# Patient Record
Sex: Male | Born: 1945 | Race: White | Hispanic: No | Marital: Married | Smoking: Never smoker
Health system: Southern US, Community
[De-identification: ages and names within clinical notes are randomized; demographics above are authoritative.]

## PROBLEM LIST (undated history)

## (undated) ENCOUNTER — Emergency Department (HOSPITAL_COMMUNITY): Payer: Medicare Other | Source: Home / Self Care

## (undated) DIAGNOSIS — M0609 Rheumatoid arthritis without rheumatoid factor, multiple sites: Secondary | ICD-10-CM

## (undated) DIAGNOSIS — I5022 Chronic systolic (congestive) heart failure: Secondary | ICD-10-CM

## (undated) DIAGNOSIS — A419 Sepsis, unspecified organism: Secondary | ICD-10-CM

## (undated) DIAGNOSIS — I255 Ischemic cardiomyopathy: Secondary | ICD-10-CM

## (undated) DIAGNOSIS — M545 Low back pain, unspecified: Secondary | ICD-10-CM

## (undated) DIAGNOSIS — N183 Chronic kidney disease, stage 3 unspecified: Secondary | ICD-10-CM

## (undated) DIAGNOSIS — F411 Generalized anxiety disorder: Secondary | ICD-10-CM

## (undated) DIAGNOSIS — I2510 Atherosclerotic heart disease of native coronary artery without angina pectoris: Secondary | ICD-10-CM

## (undated) DIAGNOSIS — Z9989 Dependence on other enabling machines and devices: Secondary | ICD-10-CM

## (undated) DIAGNOSIS — I4819 Other persistent atrial fibrillation: Secondary | ICD-10-CM

## (undated) DIAGNOSIS — M797 Fibromyalgia: Secondary | ICD-10-CM

## (undated) DIAGNOSIS — I10 Essential (primary) hypertension: Secondary | ICD-10-CM

## (undated) DIAGNOSIS — F329 Major depressive disorder, single episode, unspecified: Secondary | ICD-10-CM

## (undated) DIAGNOSIS — G8929 Other chronic pain: Secondary | ICD-10-CM

## (undated) DIAGNOSIS — M1990 Unspecified osteoarthritis, unspecified site: Secondary | ICD-10-CM

## (undated) DIAGNOSIS — E1169 Type 2 diabetes mellitus with other specified complication: Secondary | ICD-10-CM

## (undated) DIAGNOSIS — G4733 Obstructive sleep apnea (adult) (pediatric): Secondary | ICD-10-CM

## (undated) DIAGNOSIS — I219 Acute myocardial infarction, unspecified: Secondary | ICD-10-CM

## (undated) DIAGNOSIS — F32A Depression, unspecified: Secondary | ICD-10-CM

## (undated) DIAGNOSIS — E876 Hypokalemia: Secondary | ICD-10-CM

## (undated) DIAGNOSIS — E669 Obesity, unspecified: Secondary | ICD-10-CM

## (undated) DIAGNOSIS — Z9289 Personal history of other medical treatment: Secondary | ICD-10-CM

## (undated) DIAGNOSIS — E7800 Pure hypercholesterolemia, unspecified: Secondary | ICD-10-CM

## (undated) DIAGNOSIS — D649 Anemia, unspecified: Secondary | ICD-10-CM

## (undated) DIAGNOSIS — R6521 Severe sepsis with septic shock: Secondary | ICD-10-CM

## (undated) DIAGNOSIS — B159 Hepatitis A without hepatic coma: Secondary | ICD-10-CM

## (undated) HISTORY — DX: Chronic systolic (congestive) heart failure: I50.22

## (undated) HISTORY — DX: Type 2 diabetes mellitus with other specified complication: E66.9

## (undated) HISTORY — PX: KNEE ARTHROSCOPY: SHX127

## (undated) HISTORY — DX: Chronic kidney disease, stage 3 (moderate): N18.3

## (undated) HISTORY — PX: CORONARY ANGIOPLASTY WITH STENT PLACEMENT: SHX49

## (undated) HISTORY — DX: Anemia, unspecified: D64.9

## (undated) HISTORY — DX: Chronic kidney disease, stage 3 unspecified: N18.30

## (undated) HISTORY — DX: Hypokalemia: E87.6

## (undated) HISTORY — DX: Type 2 diabetes mellitus with other specified complication: E11.69

## (undated) HISTORY — DX: Generalized anxiety disorder: F41.1

## (undated) HISTORY — PX: BACK SURGERY: SHX140

## (undated) HISTORY — PX: OTHER SURGICAL HISTORY: SHX169

## (undated) HISTORY — DX: Sepsis, unspecified organism: A41.9

## (undated) HISTORY — DX: Severe sepsis with septic shock: R65.21

## (undated) HISTORY — PX: NASAL SINUS SURGERY: SHX719

## (undated) HISTORY — PX: CORONARY ANGIOPLASTY: SHX604

## (undated) HISTORY — DX: Atherosclerotic heart disease of native coronary artery without angina pectoris: I25.10

## (undated) HISTORY — DX: Other persistent atrial fibrillation: I48.19

## (undated) HISTORY — PX: CATARACT EXTRACTION W/ INTRAOCULAR LENS  IMPLANT, BILATERAL: SHX1307

## (undated) HISTORY — DX: Ischemic cardiomyopathy: I25.5

## (undated) HISTORY — DX: Rheumatoid arthritis without rheumatoid factor, multiple sites: M06.09

## (undated) SURGERY — LEFT HEART CATH AND CORONARY ANGIOGRAPHY
Anesthesia: Moderate Sedation

---
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
## 2013-01-12 DIAGNOSIS — R55 Syncope and collapse: Secondary | ICD-10-CM | POA: Insufficient documentation

## 2013-01-12 DIAGNOSIS — N179 Acute kidney failure, unspecified: Secondary | ICD-10-CM | POA: Insufficient documentation

## 2013-11-11 DIAGNOSIS — M48062 Spinal stenosis, lumbar region with neurogenic claudication: Secondary | ICD-10-CM | POA: Insufficient documentation

## 2013-11-17 DIAGNOSIS — M5126 Other intervertebral disc displacement, lumbar region: Secondary | ICD-10-CM | POA: Insufficient documentation

## 2013-12-03 DIAGNOSIS — N289 Disorder of kidney and ureter, unspecified: Secondary | ICD-10-CM

## 2013-12-29 DIAGNOSIS — Z9889 Other specified postprocedural states: Secondary | ICD-10-CM | POA: Insufficient documentation

## 2013-12-31 HISTORY — PX: POSTERIOR LUMBAR FUSION: SHX6036

## 2014-01-06 DIAGNOSIS — I10 Essential (primary) hypertension: Secondary | ICD-10-CM | POA: Diagnosis not present

## 2014-01-21 DIAGNOSIS — M999 Biomechanical lesion, unspecified: Secondary | ICD-10-CM | POA: Diagnosis not present

## 2014-01-21 DIAGNOSIS — S23101A Dislocation of unspecified thoracic vertebra, initial encounter: Secondary | ICD-10-CM | POA: Diagnosis not present

## 2014-01-21 DIAGNOSIS — S332XXA Dislocation of sacroiliac and sacrococcygeal joint, initial encounter: Secondary | ICD-10-CM | POA: Diagnosis not present

## 2014-02-05 DIAGNOSIS — E669 Obesity, unspecified: Secondary | ICD-10-CM | POA: Diagnosis not present

## 2014-02-05 DIAGNOSIS — I428 Other cardiomyopathies: Secondary | ICD-10-CM | POA: Diagnosis not present

## 2014-02-05 DIAGNOSIS — I2510 Atherosclerotic heart disease of native coronary artery without angina pectoris: Secondary | ICD-10-CM | POA: Diagnosis not present

## 2014-02-05 DIAGNOSIS — I119 Hypertensive heart disease without heart failure: Secondary | ICD-10-CM | POA: Diagnosis not present

## 2014-02-08 DIAGNOSIS — S23101A Dislocation of unspecified thoracic vertebra, initial encounter: Secondary | ICD-10-CM | POA: Diagnosis not present

## 2014-02-08 DIAGNOSIS — M999 Biomechanical lesion, unspecified: Secondary | ICD-10-CM | POA: Diagnosis not present

## 2014-02-08 DIAGNOSIS — S332XXA Dislocation of sacroiliac and sacrococcygeal joint, initial encounter: Secondary | ICD-10-CM | POA: Diagnosis not present

## 2014-02-11 DIAGNOSIS — S332XXA Dislocation of sacroiliac and sacrococcygeal joint, initial encounter: Secondary | ICD-10-CM | POA: Diagnosis not present

## 2014-02-11 DIAGNOSIS — S23101A Dislocation of unspecified thoracic vertebra, initial encounter: Secondary | ICD-10-CM | POA: Diagnosis not present

## 2014-02-11 DIAGNOSIS — M999 Biomechanical lesion, unspecified: Secondary | ICD-10-CM | POA: Diagnosis not present

## 2014-02-15 DIAGNOSIS — H40059 Ocular hypertension, unspecified eye: Secondary | ICD-10-CM | POA: Diagnosis not present

## 2014-02-15 DIAGNOSIS — Z79899 Other long term (current) drug therapy: Secondary | ICD-10-CM | POA: Diagnosis not present

## 2014-02-15 DIAGNOSIS — E119 Type 2 diabetes mellitus without complications: Secondary | ICD-10-CM | POA: Diagnosis not present

## 2014-02-15 DIAGNOSIS — S332XXA Dislocation of sacroiliac and sacrococcygeal joint, initial encounter: Secondary | ICD-10-CM | POA: Diagnosis not present

## 2014-02-15 DIAGNOSIS — S23101A Dislocation of unspecified thoracic vertebra, initial encounter: Secondary | ICD-10-CM | POA: Diagnosis not present

## 2014-02-15 DIAGNOSIS — M069 Rheumatoid arthritis, unspecified: Secondary | ICD-10-CM | POA: Diagnosis not present

## 2014-02-15 DIAGNOSIS — M999 Biomechanical lesion, unspecified: Secondary | ICD-10-CM | POA: Diagnosis not present

## 2014-02-18 DIAGNOSIS — S23101A Dislocation of unspecified thoracic vertebra, initial encounter: Secondary | ICD-10-CM | POA: Diagnosis not present

## 2014-02-18 DIAGNOSIS — M999 Biomechanical lesion, unspecified: Secondary | ICD-10-CM | POA: Diagnosis not present

## 2014-02-18 DIAGNOSIS — S332XXA Dislocation of sacroiliac and sacrococcygeal joint, initial encounter: Secondary | ICD-10-CM | POA: Diagnosis not present

## 2014-02-19 DIAGNOSIS — S23101A Dislocation of unspecified thoracic vertebra, initial encounter: Secondary | ICD-10-CM | POA: Diagnosis not present

## 2014-02-19 DIAGNOSIS — S332XXA Dislocation of sacroiliac and sacrococcygeal joint, initial encounter: Secondary | ICD-10-CM | POA: Diagnosis not present

## 2014-02-19 DIAGNOSIS — M999 Biomechanical lesion, unspecified: Secondary | ICD-10-CM | POA: Diagnosis not present

## 2014-02-22 DIAGNOSIS — S332XXA Dislocation of sacroiliac and sacrococcygeal joint, initial encounter: Secondary | ICD-10-CM | POA: Diagnosis not present

## 2014-02-22 DIAGNOSIS — S23101A Dislocation of unspecified thoracic vertebra, initial encounter: Secondary | ICD-10-CM | POA: Diagnosis not present

## 2014-02-22 DIAGNOSIS — M999 Biomechanical lesion, unspecified: Secondary | ICD-10-CM | POA: Diagnosis not present

## 2014-02-23 DIAGNOSIS — S23101A Dislocation of unspecified thoracic vertebra, initial encounter: Secondary | ICD-10-CM | POA: Diagnosis not present

## 2014-02-23 DIAGNOSIS — S332XXA Dislocation of sacroiliac and sacrococcygeal joint, initial encounter: Secondary | ICD-10-CM | POA: Diagnosis not present

## 2014-02-23 DIAGNOSIS — M999 Biomechanical lesion, unspecified: Secondary | ICD-10-CM | POA: Diagnosis not present

## 2014-02-24 DIAGNOSIS — M5126 Other intervertebral disc displacement, lumbar region: Secondary | ICD-10-CM | POA: Diagnosis not present

## 2014-03-05 DIAGNOSIS — J84112 Idiopathic pulmonary fibrosis: Secondary | ICD-10-CM | POA: Diagnosis not present

## 2014-03-05 DIAGNOSIS — G4733 Obstructive sleep apnea (adult) (pediatric): Secondary | ICD-10-CM | POA: Diagnosis not present

## 2014-03-09 DIAGNOSIS — M545 Low back pain, unspecified: Secondary | ICD-10-CM | POA: Diagnosis not present

## 2014-03-09 DIAGNOSIS — M48061 Spinal stenosis, lumbar region without neurogenic claudication: Secondary | ICD-10-CM | POA: Diagnosis not present

## 2014-03-09 DIAGNOSIS — M5126 Other intervertebral disc displacement, lumbar region: Secondary | ICD-10-CM | POA: Diagnosis not present

## 2014-03-09 DIAGNOSIS — Z9889 Other specified postprocedural states: Secondary | ICD-10-CM | POA: Diagnosis not present

## 2014-03-09 DIAGNOSIS — M4310 Spondylolisthesis, site unspecified: Secondary | ICD-10-CM | POA: Diagnosis not present

## 2014-03-10 DIAGNOSIS — Z9889 Other specified postprocedural states: Secondary | ICD-10-CM | POA: Diagnosis not present

## 2014-03-10 DIAGNOSIS — M5126 Other intervertebral disc displacement, lumbar region: Secondary | ICD-10-CM | POA: Diagnosis not present

## 2014-03-10 DIAGNOSIS — Z981 Arthrodesis status: Secondary | ICD-10-CM | POA: Diagnosis not present

## 2014-03-10 DIAGNOSIS — M4310 Spondylolisthesis, site unspecified: Secondary | ICD-10-CM | POA: Diagnosis not present

## 2014-03-10 DIAGNOSIS — Z4789 Encounter for other orthopedic aftercare: Secondary | ICD-10-CM | POA: Diagnosis not present

## 2014-03-18 DIAGNOSIS — M069 Rheumatoid arthritis, unspecified: Secondary | ICD-10-CM | POA: Diagnosis not present

## 2014-03-18 DIAGNOSIS — G609 Hereditary and idiopathic neuropathy, unspecified: Secondary | ICD-10-CM | POA: Diagnosis not present

## 2014-03-18 DIAGNOSIS — Z79899 Other long term (current) drug therapy: Secondary | ICD-10-CM | POA: Diagnosis not present

## 2014-03-18 DIAGNOSIS — F411 Generalized anxiety disorder: Secondary | ICD-10-CM | POA: Diagnosis not present

## 2014-03-18 DIAGNOSIS — M159 Polyosteoarthritis, unspecified: Secondary | ICD-10-CM | POA: Diagnosis not present

## 2014-03-22 DIAGNOSIS — D649 Anemia, unspecified: Secondary | ICD-10-CM | POA: Diagnosis not present

## 2014-03-22 DIAGNOSIS — E119 Type 2 diabetes mellitus without complications: Secondary | ICD-10-CM | POA: Diagnosis not present

## 2014-04-01 DIAGNOSIS — S332XXA Dislocation of sacroiliac and sacrococcygeal joint, initial encounter: Secondary | ICD-10-CM | POA: Diagnosis not present

## 2014-04-01 DIAGNOSIS — S23101A Dislocation of unspecified thoracic vertebra, initial encounter: Secondary | ICD-10-CM | POA: Diagnosis not present

## 2014-04-01 DIAGNOSIS — M999 Biomechanical lesion, unspecified: Secondary | ICD-10-CM | POA: Diagnosis not present

## 2014-04-09 DIAGNOSIS — D649 Anemia, unspecified: Secondary | ICD-10-CM | POA: Diagnosis not present

## 2014-04-09 DIAGNOSIS — M4310 Spondylolisthesis, site unspecified: Secondary | ICD-10-CM | POA: Diagnosis not present

## 2014-04-09 DIAGNOSIS — Z8249 Family history of ischemic heart disease and other diseases of the circulatory system: Secondary | ICD-10-CM | POA: Diagnosis not present

## 2014-04-09 DIAGNOSIS — I2510 Atherosclerotic heart disease of native coronary artery without angina pectoris: Secondary | ICD-10-CM | POA: Diagnosis present

## 2014-04-09 DIAGNOSIS — I10 Essential (primary) hypertension: Secondary | ICD-10-CM | POA: Diagnosis present

## 2014-04-09 DIAGNOSIS — Z8719 Personal history of other diseases of the digestive system: Secondary | ICD-10-CM | POA: Diagnosis not present

## 2014-04-09 DIAGNOSIS — G252 Other specified forms of tremor: Secondary | ICD-10-CM | POA: Diagnosis present

## 2014-04-09 DIAGNOSIS — Z9861 Coronary angioplasty status: Secondary | ICD-10-CM | POA: Diagnosis not present

## 2014-04-09 DIAGNOSIS — G250 Essential tremor: Secondary | ICD-10-CM | POA: Diagnosis present

## 2014-04-09 DIAGNOSIS — F411 Generalized anxiety disorder: Secondary | ICD-10-CM | POA: Diagnosis present

## 2014-04-09 DIAGNOSIS — M48061 Spinal stenosis, lumbar region without neurogenic claudication: Secondary | ICD-10-CM | POA: Insufficient documentation

## 2014-04-09 DIAGNOSIS — E119 Type 2 diabetes mellitus without complications: Secondary | ICD-10-CM | POA: Diagnosis not present

## 2014-04-09 DIAGNOSIS — IMO0002 Reserved for concepts with insufficient information to code with codable children: Secondary | ICD-10-CM | POA: Diagnosis not present

## 2014-04-09 DIAGNOSIS — Z981 Arthrodesis status: Secondary | ICD-10-CM | POA: Diagnosis not present

## 2014-04-09 DIAGNOSIS — I2589 Other forms of chronic ischemic heart disease: Secondary | ICD-10-CM | POA: Diagnosis present

## 2014-04-09 DIAGNOSIS — Z7982 Long term (current) use of aspirin: Secondary | ICD-10-CM | POA: Diagnosis not present

## 2014-04-09 DIAGNOSIS — I252 Old myocardial infarction: Secondary | ICD-10-CM | POA: Diagnosis not present

## 2014-04-09 DIAGNOSIS — Z9849 Cataract extraction status, unspecified eye: Secondary | ICD-10-CM | POA: Diagnosis not present

## 2014-04-09 DIAGNOSIS — Z9889 Other specified postprocedural states: Secondary | ICD-10-CM | POA: Diagnosis not present

## 2014-04-09 DIAGNOSIS — Z79899 Other long term (current) drug therapy: Secondary | ICD-10-CM | POA: Diagnosis not present

## 2014-04-09 DIAGNOSIS — Z7902 Long term (current) use of antithrombotics/antiplatelets: Secondary | ICD-10-CM | POA: Diagnosis not present

## 2014-04-09 DIAGNOSIS — M069 Rheumatoid arthritis, unspecified: Secondary | ICD-10-CM | POA: Diagnosis present

## 2014-04-09 DIAGNOSIS — Z794 Long term (current) use of insulin: Secondary | ICD-10-CM | POA: Diagnosis not present

## 2014-04-09 DIAGNOSIS — M5126 Other intervertebral disc displacement, lumbar region: Secondary | ICD-10-CM | POA: Diagnosis not present

## 2014-04-09 DIAGNOSIS — G4733 Obstructive sleep apnea (adult) (pediatric): Secondary | ICD-10-CM | POA: Diagnosis not present

## 2014-04-16 DIAGNOSIS — Z4789 Encounter for other orthopedic aftercare: Secondary | ICD-10-CM | POA: Diagnosis not present

## 2014-04-16 DIAGNOSIS — M5410 Radiculopathy, site unspecified: Secondary | ICD-10-CM | POA: Insufficient documentation

## 2014-04-16 DIAGNOSIS — Z981 Arthrodesis status: Secondary | ICD-10-CM | POA: Insufficient documentation

## 2014-04-16 DIAGNOSIS — R29898 Other symptoms and signs involving the musculoskeletal system: Secondary | ICD-10-CM | POA: Diagnosis not present

## 2014-04-16 DIAGNOSIS — M48061 Spinal stenosis, lumbar region without neurogenic claudication: Secondary | ICD-10-CM | POA: Diagnosis not present

## 2014-04-16 DIAGNOSIS — M79609 Pain in unspecified limb: Secondary | ICD-10-CM | POA: Diagnosis not present

## 2014-04-16 DIAGNOSIS — G8918 Other acute postprocedural pain: Secondary | ICD-10-CM | POA: Diagnosis not present

## 2014-04-21 DIAGNOSIS — I2510 Atherosclerotic heart disease of native coronary artery without angina pectoris: Secondary | ICD-10-CM | POA: Diagnosis not present

## 2014-04-21 DIAGNOSIS — F3289 Other specified depressive episodes: Secondary | ICD-10-CM | POA: Diagnosis not present

## 2014-04-21 DIAGNOSIS — E785 Hyperlipidemia, unspecified: Secondary | ICD-10-CM | POA: Diagnosis not present

## 2014-04-21 DIAGNOSIS — M4800 Spinal stenosis, site unspecified: Secondary | ICD-10-CM | POA: Diagnosis not present

## 2014-04-21 DIAGNOSIS — E559 Vitamin D deficiency, unspecified: Secondary | ICD-10-CM | POA: Diagnosis not present

## 2014-04-21 DIAGNOSIS — M6281 Muscle weakness (generalized): Secondary | ICD-10-CM | POA: Diagnosis not present

## 2014-04-21 DIAGNOSIS — D649 Anemia, unspecified: Secondary | ICD-10-CM | POA: Diagnosis not present

## 2014-04-21 DIAGNOSIS — E876 Hypokalemia: Secondary | ICD-10-CM | POA: Diagnosis not present

## 2014-04-21 DIAGNOSIS — IMO0001 Reserved for inherently not codable concepts without codable children: Secondary | ICD-10-CM | POA: Diagnosis not present

## 2014-04-21 DIAGNOSIS — R339 Retention of urine, unspecified: Secondary | ICD-10-CM | POA: Diagnosis not present

## 2014-04-21 DIAGNOSIS — N138 Other obstructive and reflux uropathy: Secondary | ICD-10-CM | POA: Diagnosis not present

## 2014-04-21 DIAGNOSIS — F411 Generalized anxiety disorder: Secondary | ICD-10-CM | POA: Diagnosis not present

## 2014-04-21 DIAGNOSIS — E119 Type 2 diabetes mellitus without complications: Secondary | ICD-10-CM | POA: Diagnosis not present

## 2014-04-21 DIAGNOSIS — M069 Rheumatoid arthritis, unspecified: Secondary | ICD-10-CM | POA: Diagnosis not present

## 2014-04-21 DIAGNOSIS — R5381 Other malaise: Secondary | ICD-10-CM | POA: Diagnosis not present

## 2014-04-21 DIAGNOSIS — G250 Essential tremor: Secondary | ICD-10-CM | POA: Diagnosis not present

## 2014-04-21 DIAGNOSIS — F329 Major depressive disorder, single episode, unspecified: Secondary | ICD-10-CM | POA: Diagnosis not present

## 2014-04-21 DIAGNOSIS — N401 Enlarged prostate with lower urinary tract symptoms: Secondary | ICD-10-CM | POA: Diagnosis not present

## 2014-04-21 DIAGNOSIS — J449 Chronic obstructive pulmonary disease, unspecified: Secondary | ICD-10-CM | POA: Diagnosis not present

## 2014-04-21 DIAGNOSIS — D539 Nutritional anemia, unspecified: Secondary | ICD-10-CM | POA: Diagnosis not present

## 2014-04-21 DIAGNOSIS — R262 Difficulty in walking, not elsewhere classified: Secondary | ICD-10-CM | POA: Diagnosis not present

## 2014-04-21 DIAGNOSIS — I10 Essential (primary) hypertension: Secondary | ICD-10-CM | POA: Diagnosis not present

## 2014-04-21 DIAGNOSIS — M62838 Other muscle spasm: Secondary | ICD-10-CM | POA: Diagnosis not present

## 2014-04-28 DIAGNOSIS — D649 Anemia, unspecified: Secondary | ICD-10-CM | POA: Diagnosis not present

## 2014-04-29 DIAGNOSIS — D539 Nutritional anemia, unspecified: Secondary | ICD-10-CM | POA: Diagnosis not present

## 2014-04-29 DIAGNOSIS — R5381 Other malaise: Secondary | ICD-10-CM | POA: Diagnosis not present

## 2014-04-29 DIAGNOSIS — E119 Type 2 diabetes mellitus without complications: Secondary | ICD-10-CM | POA: Diagnosis not present

## 2014-04-29 DIAGNOSIS — M4800 Spinal stenosis, site unspecified: Secondary | ICD-10-CM | POA: Diagnosis not present

## 2014-04-29 DIAGNOSIS — IMO0001 Reserved for inherently not codable concepts without codable children: Secondary | ICD-10-CM | POA: Diagnosis not present

## 2014-04-29 DIAGNOSIS — F411 Generalized anxiety disorder: Secondary | ICD-10-CM | POA: Diagnosis not present

## 2014-05-10 DIAGNOSIS — R339 Retention of urine, unspecified: Secondary | ICD-10-CM | POA: Diagnosis not present

## 2014-05-10 DIAGNOSIS — N138 Other obstructive and reflux uropathy: Secondary | ICD-10-CM | POA: Diagnosis not present

## 2014-05-13 DIAGNOSIS — M4800 Spinal stenosis, site unspecified: Secondary | ICD-10-CM | POA: Diagnosis not present

## 2014-05-13 DIAGNOSIS — E119 Type 2 diabetes mellitus without complications: Secondary | ICD-10-CM | POA: Diagnosis not present

## 2014-05-13 DIAGNOSIS — F411 Generalized anxiety disorder: Secondary | ICD-10-CM | POA: Diagnosis not present

## 2014-05-13 DIAGNOSIS — D539 Nutritional anemia, unspecified: Secondary | ICD-10-CM | POA: Diagnosis not present

## 2014-05-13 DIAGNOSIS — IMO0001 Reserved for inherently not codable concepts without codable children: Secondary | ICD-10-CM | POA: Diagnosis not present

## 2014-05-13 DIAGNOSIS — R5381 Other malaise: Secondary | ICD-10-CM | POA: Diagnosis not present

## 2014-05-17 DIAGNOSIS — M999 Biomechanical lesion, unspecified: Secondary | ICD-10-CM | POA: Diagnosis not present

## 2014-05-17 DIAGNOSIS — S332XXA Dislocation of sacroiliac and sacrococcygeal joint, initial encounter: Secondary | ICD-10-CM | POA: Diagnosis not present

## 2014-05-18 DIAGNOSIS — S332XXA Dislocation of sacroiliac and sacrococcygeal joint, initial encounter: Secondary | ICD-10-CM | POA: Diagnosis not present

## 2014-05-18 DIAGNOSIS — M999 Biomechanical lesion, unspecified: Secondary | ICD-10-CM | POA: Diagnosis not present

## 2014-05-20 DIAGNOSIS — R351 Nocturia: Secondary | ICD-10-CM | POA: Diagnosis not present

## 2014-05-20 DIAGNOSIS — N401 Enlarged prostate with lower urinary tract symptoms: Secondary | ICD-10-CM | POA: Diagnosis not present

## 2014-05-20 DIAGNOSIS — Z125 Encounter for screening for malignant neoplasm of prostate: Secondary | ICD-10-CM | POA: Diagnosis not present

## 2014-05-20 DIAGNOSIS — S332XXA Dislocation of sacroiliac and sacrococcygeal joint, initial encounter: Secondary | ICD-10-CM | POA: Diagnosis not present

## 2014-05-20 DIAGNOSIS — R339 Retention of urine, unspecified: Secondary | ICD-10-CM | POA: Diagnosis not present

## 2014-05-20 DIAGNOSIS — M999 Biomechanical lesion, unspecified: Secondary | ICD-10-CM | POA: Diagnosis not present

## 2014-05-21 DIAGNOSIS — M999 Biomechanical lesion, unspecified: Secondary | ICD-10-CM | POA: Diagnosis not present

## 2014-05-21 DIAGNOSIS — S332XXA Dislocation of sacroiliac and sacrococcygeal joint, initial encounter: Secondary | ICD-10-CM | POA: Diagnosis not present

## 2014-06-02 DIAGNOSIS — M48062 Spinal stenosis, lumbar region with neurogenic claudication: Secondary | ICD-10-CM | POA: Diagnosis not present

## 2014-06-02 DIAGNOSIS — Z4789 Encounter for other orthopedic aftercare: Secondary | ICD-10-CM | POA: Diagnosis not present

## 2014-06-02 DIAGNOSIS — Z9889 Other specified postprocedural states: Secondary | ICD-10-CM | POA: Diagnosis not present

## 2014-06-07 DIAGNOSIS — M999 Biomechanical lesion, unspecified: Secondary | ICD-10-CM | POA: Diagnosis not present

## 2014-06-07 DIAGNOSIS — S332XXA Dislocation of sacroiliac and sacrococcygeal joint, initial encounter: Secondary | ICD-10-CM | POA: Diagnosis not present

## 2014-06-08 DIAGNOSIS — E559 Vitamin D deficiency, unspecified: Secondary | ICD-10-CM | POA: Diagnosis not present

## 2014-06-08 DIAGNOSIS — M5137 Other intervertebral disc degeneration, lumbosacral region: Secondary | ICD-10-CM | POA: Diagnosis not present

## 2014-06-08 DIAGNOSIS — M25569 Pain in unspecified knee: Secondary | ICD-10-CM | POA: Diagnosis not present

## 2014-06-08 DIAGNOSIS — Z79899 Other long term (current) drug therapy: Secondary | ICD-10-CM | POA: Diagnosis not present

## 2014-06-08 DIAGNOSIS — M069 Rheumatoid arthritis, unspecified: Secondary | ICD-10-CM | POA: Diagnosis not present

## 2014-06-08 DIAGNOSIS — G609 Hereditary and idiopathic neuropathy, unspecified: Secondary | ICD-10-CM | POA: Diagnosis not present

## 2014-06-10 DIAGNOSIS — E876 Hypokalemia: Secondary | ICD-10-CM | POA: Diagnosis not present

## 2014-06-10 DIAGNOSIS — E559 Vitamin D deficiency, unspecified: Secondary | ICD-10-CM | POA: Diagnosis not present

## 2014-06-10 DIAGNOSIS — E119 Type 2 diabetes mellitus without complications: Secondary | ICD-10-CM | POA: Diagnosis not present

## 2014-06-10 DIAGNOSIS — E781 Pure hyperglyceridemia: Secondary | ICD-10-CM | POA: Diagnosis not present

## 2014-06-10 DIAGNOSIS — I10 Essential (primary) hypertension: Secondary | ICD-10-CM | POA: Diagnosis not present

## 2014-06-10 DIAGNOSIS — N183 Chronic kidney disease, stage 3 unspecified: Secondary | ICD-10-CM | POA: Diagnosis not present

## 2014-06-10 DIAGNOSIS — E1149 Type 2 diabetes mellitus with other diabetic neurological complication: Secondary | ICD-10-CM | POA: Diagnosis not present

## 2014-06-10 DIAGNOSIS — Z23 Encounter for immunization: Secondary | ICD-10-CM | POA: Diagnosis not present

## 2014-06-11 DIAGNOSIS — I10 Essential (primary) hypertension: Secondary | ICD-10-CM | POA: Diagnosis not present

## 2014-06-11 DIAGNOSIS — E119 Type 2 diabetes mellitus without complications: Secondary | ICD-10-CM | POA: Diagnosis not present

## 2014-06-11 DIAGNOSIS — E781 Pure hyperglyceridemia: Secondary | ICD-10-CM | POA: Diagnosis not present

## 2014-06-11 DIAGNOSIS — E559 Vitamin D deficiency, unspecified: Secondary | ICD-10-CM | POA: Diagnosis not present

## 2014-06-11 DIAGNOSIS — S332XXA Dislocation of sacroiliac and sacrococcygeal joint, initial encounter: Secondary | ICD-10-CM | POA: Diagnosis not present

## 2014-06-11 DIAGNOSIS — M999 Biomechanical lesion, unspecified: Secondary | ICD-10-CM | POA: Diagnosis not present

## 2014-06-14 DIAGNOSIS — M545 Low back pain, unspecified: Secondary | ICD-10-CM | POA: Diagnosis not present

## 2014-06-14 DIAGNOSIS — S332XXA Dislocation of sacroiliac and sacrococcygeal joint, initial encounter: Secondary | ICD-10-CM | POA: Diagnosis not present

## 2014-06-14 DIAGNOSIS — M999 Biomechanical lesion, unspecified: Secondary | ICD-10-CM | POA: Diagnosis not present

## 2014-06-17 DIAGNOSIS — M999 Biomechanical lesion, unspecified: Secondary | ICD-10-CM | POA: Diagnosis not present

## 2014-06-17 DIAGNOSIS — S332XXA Dislocation of sacroiliac and sacrococcygeal joint, initial encounter: Secondary | ICD-10-CM | POA: Diagnosis not present

## 2014-06-18 DIAGNOSIS — M545 Low back pain, unspecified: Secondary | ICD-10-CM | POA: Diagnosis not present

## 2014-06-21 DIAGNOSIS — M999 Biomechanical lesion, unspecified: Secondary | ICD-10-CM | POA: Diagnosis not present

## 2014-06-21 DIAGNOSIS — S332XXA Dislocation of sacroiliac and sacrococcygeal joint, initial encounter: Secondary | ICD-10-CM | POA: Diagnosis not present

## 2014-06-24 DIAGNOSIS — M545 Low back pain, unspecified: Secondary | ICD-10-CM | POA: Diagnosis not present

## 2014-06-28 DIAGNOSIS — M76899 Other specified enthesopathies of unspecified lower limb, excluding foot: Secondary | ICD-10-CM | POA: Diagnosis not present

## 2014-06-28 DIAGNOSIS — M069 Rheumatoid arthritis, unspecified: Secondary | ICD-10-CM | POA: Diagnosis not present

## 2014-06-28 DIAGNOSIS — M130 Polyarthritis, unspecified: Secondary | ICD-10-CM | POA: Diagnosis not present

## 2014-06-28 DIAGNOSIS — M159 Polyosteoarthritis, unspecified: Secondary | ICD-10-CM | POA: Diagnosis not present

## 2014-06-29 DIAGNOSIS — M545 Low back pain, unspecified: Secondary | ICD-10-CM | POA: Diagnosis not present

## 2014-07-01 DIAGNOSIS — M545 Low back pain, unspecified: Secondary | ICD-10-CM | POA: Diagnosis not present

## 2014-07-05 DIAGNOSIS — M545 Low back pain, unspecified: Secondary | ICD-10-CM | POA: Diagnosis not present

## 2014-07-06 DIAGNOSIS — M6281 Muscle weakness (generalized): Secondary | ICD-10-CM | POA: Diagnosis not present

## 2014-07-06 DIAGNOSIS — E559 Vitamin D deficiency, unspecified: Secondary | ICD-10-CM | POA: Diagnosis not present

## 2014-07-06 DIAGNOSIS — G250 Essential tremor: Secondary | ICD-10-CM | POA: Diagnosis not present

## 2014-07-06 DIAGNOSIS — G609 Hereditary and idiopathic neuropathy, unspecified: Secondary | ICD-10-CM | POA: Diagnosis not present

## 2014-07-06 DIAGNOSIS — G252 Other specified forms of tremor: Secondary | ICD-10-CM | POA: Diagnosis not present

## 2014-07-08 DIAGNOSIS — F411 Generalized anxiety disorder: Secondary | ICD-10-CM | POA: Diagnosis not present

## 2014-07-08 DIAGNOSIS — M545 Low back pain, unspecified: Secondary | ICD-10-CM | POA: Diagnosis not present

## 2014-07-08 DIAGNOSIS — M999 Biomechanical lesion, unspecified: Secondary | ICD-10-CM | POA: Diagnosis not present

## 2014-07-08 DIAGNOSIS — M9981 Other biomechanical lesions of cervical region: Secondary | ICD-10-CM | POA: Diagnosis not present

## 2014-07-08 DIAGNOSIS — M130 Polyarthritis, unspecified: Secondary | ICD-10-CM | POA: Diagnosis not present

## 2014-07-08 DIAGNOSIS — S13161A Dislocation of C5/C6 cervical vertebrae, initial encounter: Secondary | ICD-10-CM | POA: Diagnosis not present

## 2014-07-08 DIAGNOSIS — M069 Rheumatoid arthritis, unspecified: Secondary | ICD-10-CM | POA: Diagnosis not present

## 2014-07-08 DIAGNOSIS — S332XXA Dislocation of sacroiliac and sacrococcygeal joint, initial encounter: Secondary | ICD-10-CM | POA: Diagnosis not present

## 2014-07-08 DIAGNOSIS — M159 Polyosteoarthritis, unspecified: Secondary | ICD-10-CM | POA: Diagnosis not present

## 2014-07-09 DIAGNOSIS — S13161A Dislocation of C5/C6 cervical vertebrae, initial encounter: Secondary | ICD-10-CM | POA: Diagnosis not present

## 2014-07-09 DIAGNOSIS — M9981 Other biomechanical lesions of cervical region: Secondary | ICD-10-CM | POA: Diagnosis not present

## 2014-07-09 DIAGNOSIS — S332XXA Dislocation of sacroiliac and sacrococcygeal joint, initial encounter: Secondary | ICD-10-CM | POA: Diagnosis not present

## 2014-07-09 DIAGNOSIS — M999 Biomechanical lesion, unspecified: Secondary | ICD-10-CM | POA: Diagnosis not present

## 2014-07-12 DIAGNOSIS — M545 Low back pain, unspecified: Secondary | ICD-10-CM | POA: Diagnosis not present

## 2014-07-13 DIAGNOSIS — S332XXA Dislocation of sacroiliac and sacrococcygeal joint, initial encounter: Secondary | ICD-10-CM | POA: Diagnosis not present

## 2014-07-13 DIAGNOSIS — M9981 Other biomechanical lesions of cervical region: Secondary | ICD-10-CM | POA: Diagnosis not present

## 2014-07-13 DIAGNOSIS — S13161A Dislocation of C5/C6 cervical vertebrae, initial encounter: Secondary | ICD-10-CM | POA: Diagnosis not present

## 2014-07-13 DIAGNOSIS — M999 Biomechanical lesion, unspecified: Secondary | ICD-10-CM | POA: Diagnosis not present

## 2014-07-15 DIAGNOSIS — M545 Low back pain, unspecified: Secondary | ICD-10-CM | POA: Diagnosis not present

## 2014-07-16 DIAGNOSIS — S332XXA Dislocation of sacroiliac and sacrococcygeal joint, initial encounter: Secondary | ICD-10-CM | POA: Diagnosis not present

## 2014-07-16 DIAGNOSIS — M999 Biomechanical lesion, unspecified: Secondary | ICD-10-CM | POA: Diagnosis not present

## 2014-07-16 DIAGNOSIS — M9981 Other biomechanical lesions of cervical region: Secondary | ICD-10-CM | POA: Diagnosis not present

## 2014-07-16 DIAGNOSIS — S13161A Dislocation of C5/C6 cervical vertebrae, initial encounter: Secondary | ICD-10-CM | POA: Diagnosis not present

## 2014-07-18 DIAGNOSIS — T8131XA Disruption of external operation (surgical) wound, not elsewhere classified, initial encounter: Secondary | ICD-10-CM | POA: Diagnosis not present

## 2014-07-19 DIAGNOSIS — S332XXA Dislocation of sacroiliac and sacrococcygeal joint, initial encounter: Secondary | ICD-10-CM | POA: Diagnosis not present

## 2014-07-19 DIAGNOSIS — S13161A Dislocation of C5/C6 cervical vertebrae, initial encounter: Secondary | ICD-10-CM | POA: Diagnosis not present

## 2014-07-19 DIAGNOSIS — M545 Low back pain, unspecified: Secondary | ICD-10-CM | POA: Diagnosis not present

## 2014-07-19 DIAGNOSIS — M9981 Other biomechanical lesions of cervical region: Secondary | ICD-10-CM | POA: Diagnosis not present

## 2014-07-19 DIAGNOSIS — M999 Biomechanical lesion, unspecified: Secondary | ICD-10-CM | POA: Diagnosis not present

## 2014-07-20 DIAGNOSIS — S13161A Dislocation of C5/C6 cervical vertebrae, initial encounter: Secondary | ICD-10-CM | POA: Diagnosis not present

## 2014-07-20 DIAGNOSIS — M9981 Other biomechanical lesions of cervical region: Secondary | ICD-10-CM | POA: Diagnosis not present

## 2014-07-20 DIAGNOSIS — S332XXA Dislocation of sacroiliac and sacrococcygeal joint, initial encounter: Secondary | ICD-10-CM | POA: Diagnosis not present

## 2014-07-20 DIAGNOSIS — M999 Biomechanical lesion, unspecified: Secondary | ICD-10-CM | POA: Diagnosis not present

## 2014-07-26 DIAGNOSIS — M545 Low back pain, unspecified: Secondary | ICD-10-CM | POA: Diagnosis not present

## 2014-07-29 DIAGNOSIS — M545 Low back pain, unspecified: Secondary | ICD-10-CM | POA: Diagnosis not present

## 2014-08-02 DIAGNOSIS — M545 Low back pain, unspecified: Secondary | ICD-10-CM | POA: Diagnosis not present

## 2014-08-02 DIAGNOSIS — S13161A Dislocation of C5/C6 cervical vertebrae, initial encounter: Secondary | ICD-10-CM | POA: Diagnosis not present

## 2014-08-02 DIAGNOSIS — M9981 Other biomechanical lesions of cervical region: Secondary | ICD-10-CM | POA: Diagnosis not present

## 2014-08-02 DIAGNOSIS — S332XXA Dislocation of sacroiliac and sacrococcygeal joint, initial encounter: Secondary | ICD-10-CM | POA: Diagnosis not present

## 2014-08-02 DIAGNOSIS — M999 Biomechanical lesion, unspecified: Secondary | ICD-10-CM | POA: Diagnosis not present

## 2014-08-03 DIAGNOSIS — G609 Hereditary and idiopathic neuropathy, unspecified: Secondary | ICD-10-CM | POA: Diagnosis not present

## 2014-08-03 DIAGNOSIS — IMO0002 Reserved for concepts with insufficient information to code with codable children: Secondary | ICD-10-CM | POA: Diagnosis not present

## 2014-08-04 DIAGNOSIS — M545 Low back pain, unspecified: Secondary | ICD-10-CM | POA: Diagnosis not present

## 2014-08-11 DIAGNOSIS — I2510 Atherosclerotic heart disease of native coronary artery without angina pectoris: Secondary | ICD-10-CM | POA: Diagnosis not present

## 2014-08-11 DIAGNOSIS — I119 Hypertensive heart disease without heart failure: Secondary | ICD-10-CM | POA: Diagnosis not present

## 2014-08-11 DIAGNOSIS — I428 Other cardiomyopathies: Secondary | ICD-10-CM | POA: Diagnosis not present

## 2014-08-11 DIAGNOSIS — E669 Obesity, unspecified: Secondary | ICD-10-CM | POA: Diagnosis not present

## 2014-08-12 DIAGNOSIS — M545 Low back pain, unspecified: Secondary | ICD-10-CM | POA: Diagnosis not present

## 2014-08-16 DIAGNOSIS — M999 Biomechanical lesion, unspecified: Secondary | ICD-10-CM | POA: Diagnosis not present

## 2014-08-16 DIAGNOSIS — H33309 Unspecified retinal break, unspecified eye: Secondary | ICD-10-CM | POA: Diagnosis not present

## 2014-08-16 DIAGNOSIS — E119 Type 2 diabetes mellitus without complications: Secondary | ICD-10-CM | POA: Diagnosis not present

## 2014-08-16 DIAGNOSIS — S332XXA Dislocation of sacroiliac and sacrococcygeal joint, initial encounter: Secondary | ICD-10-CM | POA: Diagnosis not present

## 2014-08-16 DIAGNOSIS — S13161A Dislocation of C5/C6 cervical vertebrae, initial encounter: Secondary | ICD-10-CM | POA: Diagnosis not present

## 2014-08-16 DIAGNOSIS — Z79899 Other long term (current) drug therapy: Secondary | ICD-10-CM | POA: Diagnosis not present

## 2014-08-16 DIAGNOSIS — H2510 Age-related nuclear cataract, unspecified eye: Secondary | ICD-10-CM | POA: Diagnosis not present

## 2014-08-16 DIAGNOSIS — M9981 Other biomechanical lesions of cervical region: Secondary | ICD-10-CM | POA: Diagnosis not present

## 2014-08-19 DIAGNOSIS — M545 Low back pain, unspecified: Secondary | ICD-10-CM | POA: Diagnosis not present

## 2014-08-26 DIAGNOSIS — M545 Low back pain, unspecified: Secondary | ICD-10-CM | POA: Diagnosis not present

## 2014-09-02 DIAGNOSIS — M545 Low back pain, unspecified: Secondary | ICD-10-CM | POA: Diagnosis not present

## 2014-09-03 DIAGNOSIS — G609 Hereditary and idiopathic neuropathy, unspecified: Secondary | ICD-10-CM | POA: Diagnosis not present

## 2014-09-03 DIAGNOSIS — M2410 Other articular cartilage disorders, unspecified site: Secondary | ICD-10-CM | POA: Diagnosis not present

## 2014-09-03 DIAGNOSIS — M069 Rheumatoid arthritis, unspecified: Secondary | ICD-10-CM | POA: Diagnosis not present

## 2014-09-03 DIAGNOSIS — Z79899 Other long term (current) drug therapy: Secondary | ICD-10-CM | POA: Diagnosis not present

## 2014-09-03 DIAGNOSIS — M25569 Pain in unspecified knee: Secondary | ICD-10-CM | POA: Diagnosis not present

## 2014-09-03 DIAGNOSIS — M5137 Other intervertebral disc degeneration, lumbosacral region: Secondary | ICD-10-CM | POA: Diagnosis not present

## 2014-09-03 DIAGNOSIS — M130 Polyarthritis, unspecified: Secondary | ICD-10-CM | POA: Diagnosis not present

## 2014-09-21 DIAGNOSIS — H40019 Open angle with borderline findings, low risk, unspecified eye: Secondary | ICD-10-CM | POA: Diagnosis not present

## 2014-09-21 DIAGNOSIS — H43819 Vitreous degeneration, unspecified eye: Secondary | ICD-10-CM | POA: Diagnosis not present

## 2014-09-21 DIAGNOSIS — H33309 Unspecified retinal break, unspecified eye: Secondary | ICD-10-CM | POA: Diagnosis not present

## 2014-10-14 DIAGNOSIS — Z23 Encounter for immunization: Secondary | ICD-10-CM | POA: Diagnosis not present

## 2014-10-14 DIAGNOSIS — E780 Pure hypercholesterolemia: Secondary | ICD-10-CM | POA: Diagnosis not present

## 2014-10-14 DIAGNOSIS — R809 Proteinuria, unspecified: Secondary | ICD-10-CM | POA: Diagnosis not present

## 2014-10-14 DIAGNOSIS — I10 Essential (primary) hypertension: Secondary | ICD-10-CM | POA: Diagnosis not present

## 2014-10-14 DIAGNOSIS — D649 Anemia, unspecified: Secondary | ICD-10-CM | POA: Diagnosis not present

## 2014-10-14 DIAGNOSIS — E119 Type 2 diabetes mellitus without complications: Secondary | ICD-10-CM | POA: Diagnosis not present

## 2014-10-14 DIAGNOSIS — I2510 Atherosclerotic heart disease of native coronary artery without angina pectoris: Secondary | ICD-10-CM | POA: Diagnosis not present

## 2014-10-21 DIAGNOSIS — H35341 Macular cyst, hole, or pseudohole, right eye: Secondary | ICD-10-CM | POA: Diagnosis not present

## 2014-10-21 DIAGNOSIS — H43813 Vitreous degeneration, bilateral: Secondary | ICD-10-CM | POA: Diagnosis not present

## 2014-10-25 DIAGNOSIS — S23140A Subluxation of T6/T7 thoracic vertebra, initial encounter: Secondary | ICD-10-CM | POA: Diagnosis not present

## 2014-10-25 DIAGNOSIS — S332XXA Dislocation of sacroiliac and sacrococcygeal joint, initial encounter: Secondary | ICD-10-CM | POA: Diagnosis not present

## 2014-10-25 DIAGNOSIS — M9904 Segmental and somatic dysfunction of sacral region: Secondary | ICD-10-CM | POA: Diagnosis not present

## 2014-10-25 DIAGNOSIS — M9902 Segmental and somatic dysfunction of thoracic region: Secondary | ICD-10-CM | POA: Diagnosis not present

## 2014-10-29 DIAGNOSIS — M0579 Rheumatoid arthritis with rheumatoid factor of multiple sites without organ or systems involvement: Secondary | ICD-10-CM | POA: Diagnosis not present

## 2014-10-29 DIAGNOSIS — Z79899 Other long term (current) drug therapy: Secondary | ICD-10-CM | POA: Diagnosis not present

## 2014-10-29 DIAGNOSIS — E559 Vitamin D deficiency, unspecified: Secondary | ICD-10-CM | POA: Diagnosis not present

## 2014-10-29 DIAGNOSIS — M064 Inflammatory polyarthropathy: Secondary | ICD-10-CM | POA: Diagnosis not present

## 2014-10-29 DIAGNOSIS — M5416 Radiculopathy, lumbar region: Secondary | ICD-10-CM | POA: Diagnosis not present

## 2014-11-03 DIAGNOSIS — Z23 Encounter for immunization: Secondary | ICD-10-CM | POA: Diagnosis not present

## 2014-11-16 DIAGNOSIS — J309 Allergic rhinitis, unspecified: Secondary | ICD-10-CM | POA: Diagnosis not present

## 2014-11-16 DIAGNOSIS — G4733 Obstructive sleep apnea (adult) (pediatric): Secondary | ICD-10-CM | POA: Diagnosis not present

## 2014-11-30 DIAGNOSIS — M9902 Segmental and somatic dysfunction of thoracic region: Secondary | ICD-10-CM | POA: Diagnosis not present

## 2014-11-30 DIAGNOSIS — M9904 Segmental and somatic dysfunction of sacral region: Secondary | ICD-10-CM | POA: Diagnosis not present

## 2014-11-30 DIAGNOSIS — S332XXA Dislocation of sacroiliac and sacrococcygeal joint, initial encounter: Secondary | ICD-10-CM | POA: Diagnosis not present

## 2014-11-30 DIAGNOSIS — S23130A Subluxation of T4/T5 thoracic vertebra, initial encounter: Secondary | ICD-10-CM | POA: Diagnosis not present

## 2014-12-03 DIAGNOSIS — S23130A Subluxation of T4/T5 thoracic vertebra, initial encounter: Secondary | ICD-10-CM | POA: Diagnosis not present

## 2014-12-03 DIAGNOSIS — S332XXA Dislocation of sacroiliac and sacrococcygeal joint, initial encounter: Secondary | ICD-10-CM | POA: Diagnosis not present

## 2014-12-03 DIAGNOSIS — M9902 Segmental and somatic dysfunction of thoracic region: Secondary | ICD-10-CM | POA: Diagnosis not present

## 2014-12-03 DIAGNOSIS — M9904 Segmental and somatic dysfunction of sacral region: Secondary | ICD-10-CM | POA: Diagnosis not present

## 2014-12-07 DIAGNOSIS — S332XXA Dislocation of sacroiliac and sacrococcygeal joint, initial encounter: Secondary | ICD-10-CM | POA: Diagnosis not present

## 2014-12-07 DIAGNOSIS — M9902 Segmental and somatic dysfunction of thoracic region: Secondary | ICD-10-CM | POA: Diagnosis not present

## 2014-12-07 DIAGNOSIS — S23130A Subluxation of T4/T5 thoracic vertebra, initial encounter: Secondary | ICD-10-CM | POA: Diagnosis not present

## 2014-12-07 DIAGNOSIS — M9904 Segmental and somatic dysfunction of sacral region: Secondary | ICD-10-CM | POA: Diagnosis not present

## 2014-12-20 DIAGNOSIS — T148 Other injury of unspecified body region: Secondary | ICD-10-CM | POA: Diagnosis not present

## 2014-12-20 DIAGNOSIS — Z719 Counseling, unspecified: Secondary | ICD-10-CM | POA: Diagnosis not present

## 2014-12-20 DIAGNOSIS — J329 Chronic sinusitis, unspecified: Secondary | ICD-10-CM | POA: Diagnosis not present

## 2014-12-20 DIAGNOSIS — H578 Other specified disorders of eye and adnexa: Secondary | ICD-10-CM | POA: Diagnosis not present

## 2014-12-21 DIAGNOSIS — M9904 Segmental and somatic dysfunction of sacral region: Secondary | ICD-10-CM | POA: Diagnosis not present

## 2014-12-21 DIAGNOSIS — S23130A Subluxation of T4/T5 thoracic vertebra, initial encounter: Secondary | ICD-10-CM | POA: Diagnosis not present

## 2014-12-21 DIAGNOSIS — S332XXA Dislocation of sacroiliac and sacrococcygeal joint, initial encounter: Secondary | ICD-10-CM | POA: Diagnosis not present

## 2014-12-21 DIAGNOSIS — M9902 Segmental and somatic dysfunction of thoracic region: Secondary | ICD-10-CM | POA: Diagnosis not present

## 2015-01-03 DIAGNOSIS — F419 Anxiety disorder, unspecified: Secondary | ICD-10-CM | POA: Diagnosis not present

## 2015-01-03 DIAGNOSIS — Z79899 Other long term (current) drug therapy: Secondary | ICD-10-CM | POA: Diagnosis not present

## 2015-01-03 DIAGNOSIS — M0579 Rheumatoid arthritis with rheumatoid factor of multiple sites without organ or systems involvement: Secondary | ICD-10-CM | POA: Diagnosis not present

## 2015-01-03 DIAGNOSIS — G629 Polyneuropathy, unspecified: Secondary | ICD-10-CM | POA: Diagnosis not present

## 2015-01-03 DIAGNOSIS — R251 Tremor, unspecified: Secondary | ICD-10-CM | POA: Diagnosis not present

## 2015-01-03 DIAGNOSIS — M064 Inflammatory polyarthropathy: Secondary | ICD-10-CM | POA: Diagnosis not present

## 2015-01-06 DIAGNOSIS — H33321 Round hole, right eye: Secondary | ICD-10-CM | POA: Diagnosis not present

## 2015-01-18 DIAGNOSIS — M9902 Segmental and somatic dysfunction of thoracic region: Secondary | ICD-10-CM | POA: Diagnosis not present

## 2015-01-18 DIAGNOSIS — S332XXA Dislocation of sacroiliac and sacrococcygeal joint, initial encounter: Secondary | ICD-10-CM | POA: Diagnosis not present

## 2015-01-18 DIAGNOSIS — S23140A Subluxation of T6/T7 thoracic vertebra, initial encounter: Secondary | ICD-10-CM | POA: Diagnosis not present

## 2015-01-18 DIAGNOSIS — M9904 Segmental and somatic dysfunction of sacral region: Secondary | ICD-10-CM | POA: Diagnosis not present

## 2015-02-02 DIAGNOSIS — H25011 Cortical age-related cataract, right eye: Secondary | ICD-10-CM | POA: Diagnosis not present

## 2015-02-02 DIAGNOSIS — H2181 Floppy iris syndrome: Secondary | ICD-10-CM | POA: Diagnosis not present

## 2015-02-02 DIAGNOSIS — H25041 Posterior subcapsular polar age-related cataract, right eye: Secondary | ICD-10-CM | POA: Diagnosis not present

## 2015-02-02 DIAGNOSIS — H25811 Combined forms of age-related cataract, right eye: Secondary | ICD-10-CM | POA: Diagnosis not present

## 2015-02-02 DIAGNOSIS — H2511 Age-related nuclear cataract, right eye: Secondary | ICD-10-CM | POA: Diagnosis not present

## 2015-02-08 DIAGNOSIS — I2510 Atherosclerotic heart disease of native coronary artery without angina pectoris: Secondary | ICD-10-CM | POA: Diagnosis not present

## 2015-02-08 DIAGNOSIS — E785 Hyperlipidemia, unspecified: Secondary | ICD-10-CM | POA: Diagnosis not present

## 2015-02-08 DIAGNOSIS — E119 Type 2 diabetes mellitus without complications: Secondary | ICD-10-CM | POA: Diagnosis not present

## 2015-02-08 DIAGNOSIS — I10 Essential (primary) hypertension: Secondary | ICD-10-CM | POA: Diagnosis not present

## 2015-02-15 DIAGNOSIS — M9902 Segmental and somatic dysfunction of thoracic region: Secondary | ICD-10-CM | POA: Diagnosis not present

## 2015-02-15 DIAGNOSIS — S23140A Subluxation of T6/T7 thoracic vertebra, initial encounter: Secondary | ICD-10-CM | POA: Diagnosis not present

## 2015-02-15 DIAGNOSIS — M9904 Segmental and somatic dysfunction of sacral region: Secondary | ICD-10-CM | POA: Diagnosis not present

## 2015-02-15 DIAGNOSIS — S332XXA Dislocation of sacroiliac and sacrococcygeal joint, initial encounter: Secondary | ICD-10-CM | POA: Diagnosis not present

## 2015-02-17 DIAGNOSIS — E119 Type 2 diabetes mellitus without complications: Secondary | ICD-10-CM | POA: Diagnosis not present

## 2015-02-17 DIAGNOSIS — H33321 Round hole, right eye: Secondary | ICD-10-CM | POA: Diagnosis not present

## 2015-02-17 DIAGNOSIS — H43811 Vitreous degeneration, right eye: Secondary | ICD-10-CM | POA: Diagnosis not present

## 2015-02-24 DIAGNOSIS — I2510 Atherosclerotic heart disease of native coronary artery without angina pectoris: Secondary | ICD-10-CM | POA: Diagnosis not present

## 2015-02-24 DIAGNOSIS — R809 Proteinuria, unspecified: Secondary | ICD-10-CM | POA: Diagnosis not present

## 2015-02-24 DIAGNOSIS — D649 Anemia, unspecified: Secondary | ICD-10-CM | POA: Diagnosis not present

## 2015-02-24 DIAGNOSIS — I10 Essential (primary) hypertension: Secondary | ICD-10-CM | POA: Diagnosis not present

## 2015-02-24 DIAGNOSIS — E119 Type 2 diabetes mellitus without complications: Secondary | ICD-10-CM | POA: Diagnosis not present

## 2015-02-24 DIAGNOSIS — E780 Pure hypercholesterolemia: Secondary | ICD-10-CM | POA: Diagnosis not present

## 2015-02-28 DIAGNOSIS — R700 Elevated erythrocyte sedimentation rate: Secondary | ICD-10-CM | POA: Diagnosis not present

## 2015-02-28 DIAGNOSIS — M0579 Rheumatoid arthritis with rheumatoid factor of multiple sites without organ or systems involvement: Secondary | ICD-10-CM | POA: Diagnosis not present

## 2015-02-28 DIAGNOSIS — R7989 Other specified abnormal findings of blood chemistry: Secondary | ICD-10-CM | POA: Diagnosis not present

## 2015-02-28 DIAGNOSIS — M545 Low back pain: Secondary | ICD-10-CM | POA: Diagnosis not present

## 2015-03-10 DIAGNOSIS — S23130A Subluxation of T4/T5 thoracic vertebra, initial encounter: Secondary | ICD-10-CM | POA: Diagnosis not present

## 2015-03-10 DIAGNOSIS — M9902 Segmental and somatic dysfunction of thoracic region: Secondary | ICD-10-CM | POA: Diagnosis not present

## 2015-03-10 DIAGNOSIS — S332XXA Dislocation of sacroiliac and sacrococcygeal joint, initial encounter: Secondary | ICD-10-CM | POA: Diagnosis not present

## 2015-03-10 DIAGNOSIS — M9904 Segmental and somatic dysfunction of sacral region: Secondary | ICD-10-CM | POA: Diagnosis not present

## 2015-03-26 DIAGNOSIS — S90859A Superficial foreign body, unspecified foot, initial encounter: Secondary | ICD-10-CM | POA: Diagnosis not present

## 2015-03-28 DIAGNOSIS — M79642 Pain in left hand: Secondary | ICD-10-CM | POA: Diagnosis not present

## 2015-03-28 DIAGNOSIS — M7552 Bursitis of left shoulder: Secondary | ICD-10-CM | POA: Diagnosis not present

## 2015-03-28 DIAGNOSIS — Z79899 Other long term (current) drug therapy: Secondary | ICD-10-CM | POA: Diagnosis not present

## 2015-03-28 DIAGNOSIS — M545 Low back pain: Secondary | ICD-10-CM | POA: Diagnosis not present

## 2015-03-28 DIAGNOSIS — M79641 Pain in right hand: Secondary | ICD-10-CM | POA: Diagnosis not present

## 2015-03-28 DIAGNOSIS — M0579 Rheumatoid arthritis with rheumatoid factor of multiple sites without organ or systems involvement: Secondary | ICD-10-CM | POA: Diagnosis not present

## 2015-05-13 DIAGNOSIS — I10 Essential (primary) hypertension: Secondary | ICD-10-CM | POA: Diagnosis not present

## 2015-05-13 DIAGNOSIS — N400 Enlarged prostate without lower urinary tract symptoms: Secondary | ICD-10-CM | POA: Diagnosis not present

## 2015-05-13 DIAGNOSIS — F419 Anxiety disorder, unspecified: Secondary | ICD-10-CM | POA: Diagnosis not present

## 2015-05-13 DIAGNOSIS — F341 Dysthymic disorder: Secondary | ICD-10-CM | POA: Diagnosis not present

## 2015-05-16 DIAGNOSIS — Z125 Encounter for screening for malignant neoplasm of prostate: Secondary | ICD-10-CM | POA: Diagnosis not present

## 2015-05-19 DIAGNOSIS — E109 Type 1 diabetes mellitus without complications: Secondary | ICD-10-CM | POA: Diagnosis not present

## 2015-05-19 DIAGNOSIS — H43811 Vitreous degeneration, right eye: Secondary | ICD-10-CM | POA: Diagnosis not present

## 2015-05-26 DIAGNOSIS — R351 Nocturia: Secondary | ICD-10-CM | POA: Diagnosis not present

## 2015-05-26 DIAGNOSIS — R3914 Feeling of incomplete bladder emptying: Secondary | ICD-10-CM | POA: Diagnosis not present

## 2015-05-26 DIAGNOSIS — Z125 Encounter for screening for malignant neoplasm of prostate: Secondary | ICD-10-CM | POA: Diagnosis not present

## 2015-05-26 DIAGNOSIS — R601 Generalized edema: Secondary | ICD-10-CM | POA: Diagnosis not present

## 2015-06-06 DIAGNOSIS — E780 Pure hypercholesterolemia: Secondary | ICD-10-CM | POA: Diagnosis not present

## 2015-06-06 DIAGNOSIS — D649 Anemia, unspecified: Secondary | ICD-10-CM | POA: Diagnosis not present

## 2015-06-06 DIAGNOSIS — Z0000 Encounter for general adult medical examination without abnormal findings: Secondary | ICD-10-CM | POA: Diagnosis not present

## 2015-06-06 DIAGNOSIS — R809 Proteinuria, unspecified: Secondary | ICD-10-CM | POA: Diagnosis not present

## 2015-06-06 DIAGNOSIS — I2510 Atherosclerotic heart disease of native coronary artery without angina pectoris: Secondary | ICD-10-CM | POA: Diagnosis not present

## 2015-06-06 DIAGNOSIS — E119 Type 2 diabetes mellitus without complications: Secondary | ICD-10-CM | POA: Diagnosis not present

## 2015-06-06 DIAGNOSIS — I10 Essential (primary) hypertension: Secondary | ICD-10-CM | POA: Diagnosis not present

## 2015-06-13 DIAGNOSIS — Z79899 Other long term (current) drug therapy: Secondary | ICD-10-CM | POA: Diagnosis not present

## 2015-06-13 DIAGNOSIS — M79672 Pain in left foot: Secondary | ICD-10-CM | POA: Diagnosis not present

## 2015-06-13 DIAGNOSIS — M0579 Rheumatoid arthritis with rheumatoid factor of multiple sites without organ or systems involvement: Secondary | ICD-10-CM | POA: Diagnosis not present

## 2015-06-13 DIAGNOSIS — M79671 Pain in right foot: Secondary | ICD-10-CM | POA: Diagnosis not present

## 2015-06-13 DIAGNOSIS — M545 Low back pain: Secondary | ICD-10-CM | POA: Diagnosis not present

## 2015-06-27 DIAGNOSIS — I10 Essential (primary) hypertension: Secondary | ICD-10-CM | POA: Diagnosis not present

## 2015-07-05 DIAGNOSIS — D649 Anemia, unspecified: Secondary | ICD-10-CM | POA: Diagnosis not present

## 2015-07-09 DIAGNOSIS — M9902 Segmental and somatic dysfunction of thoracic region: Secondary | ICD-10-CM | POA: Diagnosis not present

## 2015-07-09 DIAGNOSIS — S332XXA Dislocation of sacroiliac and sacrococcygeal joint, initial encounter: Secondary | ICD-10-CM | POA: Diagnosis not present

## 2015-07-09 DIAGNOSIS — S23140A Subluxation of T6/T7 thoracic vertebra, initial encounter: Secondary | ICD-10-CM | POA: Diagnosis not present

## 2015-07-09 DIAGNOSIS — M9904 Segmental and somatic dysfunction of sacral region: Secondary | ICD-10-CM | POA: Diagnosis not present

## 2015-07-11 DIAGNOSIS — I509 Heart failure, unspecified: Secondary | ICD-10-CM | POA: Diagnosis not present

## 2015-07-11 DIAGNOSIS — R079 Chest pain, unspecified: Secondary | ICD-10-CM | POA: Diagnosis not present

## 2015-07-11 DIAGNOSIS — R0600 Dyspnea, unspecified: Secondary | ICD-10-CM | POA: Diagnosis not present

## 2015-07-11 DIAGNOSIS — R61 Generalized hyperhidrosis: Secondary | ICD-10-CM | POA: Diagnosis not present

## 2015-07-11 DIAGNOSIS — E780 Pure hypercholesterolemia: Secondary | ICD-10-CM | POA: Diagnosis not present

## 2015-07-11 DIAGNOSIS — I10 Essential (primary) hypertension: Secondary | ICD-10-CM | POA: Diagnosis not present

## 2015-07-11 DIAGNOSIS — I2510 Atherosclerotic heart disease of native coronary artery without angina pectoris: Secondary | ICD-10-CM | POA: Diagnosis not present

## 2015-07-11 DIAGNOSIS — S99929A Unspecified injury of unspecified foot, initial encounter: Secondary | ICD-10-CM | POA: Diagnosis not present

## 2015-07-13 DIAGNOSIS — Z8709 Personal history of other diseases of the respiratory system: Secondary | ICD-10-CM | POA: Diagnosis not present

## 2015-07-13 DIAGNOSIS — J189 Pneumonia, unspecified organism: Secondary | ICD-10-CM | POA: Diagnosis not present

## 2015-07-13 DIAGNOSIS — G4733 Obstructive sleep apnea (adult) (pediatric): Secondary | ICD-10-CM | POA: Diagnosis not present

## 2015-07-13 DIAGNOSIS — I2510 Atherosclerotic heart disease of native coronary artery without angina pectoris: Secondary | ICD-10-CM | POA: Diagnosis not present

## 2015-07-18 DIAGNOSIS — S332XXA Dislocation of sacroiliac and sacrococcygeal joint, initial encounter: Secondary | ICD-10-CM | POA: Diagnosis not present

## 2015-07-18 DIAGNOSIS — M9904 Segmental and somatic dysfunction of sacral region: Secondary | ICD-10-CM | POA: Diagnosis not present

## 2015-07-18 DIAGNOSIS — H4011X2 Primary open-angle glaucoma, moderate stage: Secondary | ICD-10-CM | POA: Diagnosis not present

## 2015-07-18 DIAGNOSIS — M9902 Segmental and somatic dysfunction of thoracic region: Secondary | ICD-10-CM | POA: Diagnosis not present

## 2015-07-18 DIAGNOSIS — S23140A Subluxation of T6/T7 thoracic vertebra, initial encounter: Secondary | ICD-10-CM | POA: Diagnosis not present

## 2015-07-19 DIAGNOSIS — S23140A Subluxation of T6/T7 thoracic vertebra, initial encounter: Secondary | ICD-10-CM | POA: Diagnosis not present

## 2015-07-19 DIAGNOSIS — M9904 Segmental and somatic dysfunction of sacral region: Secondary | ICD-10-CM | POA: Diagnosis not present

## 2015-07-19 DIAGNOSIS — S332XXA Dislocation of sacroiliac and sacrococcygeal joint, initial encounter: Secondary | ICD-10-CM | POA: Diagnosis not present

## 2015-07-19 DIAGNOSIS — M9902 Segmental and somatic dysfunction of thoracic region: Secondary | ICD-10-CM | POA: Diagnosis not present

## 2015-07-22 DIAGNOSIS — B370 Candidal stomatitis: Secondary | ICD-10-CM | POA: Diagnosis not present

## 2015-07-22 DIAGNOSIS — Z719 Counseling, unspecified: Secondary | ICD-10-CM | POA: Diagnosis not present

## 2015-07-22 DIAGNOSIS — K140 Glossitis: Secondary | ICD-10-CM | POA: Diagnosis not present

## 2015-07-30 DIAGNOSIS — M9902 Segmental and somatic dysfunction of thoracic region: Secondary | ICD-10-CM | POA: Diagnosis not present

## 2015-07-30 DIAGNOSIS — B001 Herpesviral vesicular dermatitis: Secondary | ICD-10-CM | POA: Diagnosis not present

## 2015-07-30 DIAGNOSIS — M9904 Segmental and somatic dysfunction of sacral region: Secondary | ICD-10-CM | POA: Diagnosis not present

## 2015-07-30 DIAGNOSIS — S332XXA Dislocation of sacroiliac and sacrococcygeal joint, initial encounter: Secondary | ICD-10-CM | POA: Diagnosis not present

## 2015-07-30 DIAGNOSIS — K1379 Other lesions of oral mucosa: Secondary | ICD-10-CM | POA: Diagnosis not present

## 2015-07-30 DIAGNOSIS — S23140A Subluxation of T6/T7 thoracic vertebra, initial encounter: Secondary | ICD-10-CM | POA: Diagnosis not present

## 2015-08-01 DIAGNOSIS — J189 Pneumonia, unspecified organism: Secondary | ICD-10-CM | POA: Diagnosis not present

## 2015-08-01 DIAGNOSIS — Z8709 Personal history of other diseases of the respiratory system: Secondary | ICD-10-CM | POA: Diagnosis not present

## 2015-08-01 DIAGNOSIS — G4733 Obstructive sleep apnea (adult) (pediatric): Secondary | ICD-10-CM | POA: Diagnosis not present

## 2015-08-01 DIAGNOSIS — J309 Allergic rhinitis, unspecified: Secondary | ICD-10-CM | POA: Diagnosis not present

## 2015-08-01 DIAGNOSIS — S23140A Subluxation of T6/T7 thoracic vertebra, initial encounter: Secondary | ICD-10-CM | POA: Diagnosis not present

## 2015-08-01 DIAGNOSIS — S332XXA Dislocation of sacroiliac and sacrococcygeal joint, initial encounter: Secondary | ICD-10-CM | POA: Diagnosis not present

## 2015-08-01 DIAGNOSIS — M9904 Segmental and somatic dysfunction of sacral region: Secondary | ICD-10-CM | POA: Diagnosis not present

## 2015-08-01 DIAGNOSIS — M9902 Segmental and somatic dysfunction of thoracic region: Secondary | ICD-10-CM | POA: Diagnosis not present

## 2015-08-01 DIAGNOSIS — B028 Zoster with other complications: Secondary | ICD-10-CM | POA: Diagnosis not present

## 2015-08-05 DIAGNOSIS — I2510 Atherosclerotic heart disease of native coronary artery without angina pectoris: Secondary | ICD-10-CM | POA: Diagnosis not present

## 2015-08-05 DIAGNOSIS — I429 Cardiomyopathy, unspecified: Secondary | ICD-10-CM | POA: Diagnosis not present

## 2015-08-07 DIAGNOSIS — Z719 Counseling, unspecified: Secondary | ICD-10-CM | POA: Diagnosis not present

## 2015-08-07 DIAGNOSIS — H9201 Otalgia, right ear: Secondary | ICD-10-CM | POA: Diagnosis not present

## 2015-08-08 DIAGNOSIS — S23140A Subluxation of T6/T7 thoracic vertebra, initial encounter: Secondary | ICD-10-CM | POA: Diagnosis not present

## 2015-08-08 DIAGNOSIS — S332XXA Dislocation of sacroiliac and sacrococcygeal joint, initial encounter: Secondary | ICD-10-CM | POA: Diagnosis not present

## 2015-08-08 DIAGNOSIS — M9904 Segmental and somatic dysfunction of sacral region: Secondary | ICD-10-CM | POA: Diagnosis not present

## 2015-08-08 DIAGNOSIS — M9902 Segmental and somatic dysfunction of thoracic region: Secondary | ICD-10-CM | POA: Diagnosis not present

## 2015-08-12 DIAGNOSIS — H4011X2 Primary open-angle glaucoma, moderate stage: Secondary | ICD-10-CM | POA: Diagnosis not present

## 2015-08-15 DIAGNOSIS — S332XXA Dislocation of sacroiliac and sacrococcygeal joint, initial encounter: Secondary | ICD-10-CM | POA: Diagnosis not present

## 2015-08-15 DIAGNOSIS — M9904 Segmental and somatic dysfunction of sacral region: Secondary | ICD-10-CM | POA: Diagnosis not present

## 2015-08-20 DIAGNOSIS — S332XXA Dislocation of sacroiliac and sacrococcygeal joint, initial encounter: Secondary | ICD-10-CM | POA: Diagnosis not present

## 2015-08-20 DIAGNOSIS — M9904 Segmental and somatic dysfunction of sacral region: Secondary | ICD-10-CM | POA: Diagnosis not present

## 2015-08-29 DIAGNOSIS — G609 Hereditary and idiopathic neuropathy, unspecified: Secondary | ICD-10-CM | POA: Diagnosis not present

## 2015-08-29 DIAGNOSIS — G250 Essential tremor: Secondary | ICD-10-CM | POA: Diagnosis not present

## 2015-09-06 DIAGNOSIS — M7552 Bursitis of left shoulder: Secondary | ICD-10-CM | POA: Diagnosis not present

## 2015-09-06 DIAGNOSIS — S332XXA Dislocation of sacroiliac and sacrococcygeal joint, initial encounter: Secondary | ICD-10-CM | POA: Diagnosis not present

## 2015-09-06 DIAGNOSIS — M9904 Segmental and somatic dysfunction of sacral region: Secondary | ICD-10-CM | POA: Diagnosis not present

## 2015-09-06 DIAGNOSIS — M170 Bilateral primary osteoarthritis of knee: Secondary | ICD-10-CM | POA: Diagnosis not present

## 2015-09-06 DIAGNOSIS — M545 Low back pain: Secondary | ICD-10-CM | POA: Diagnosis not present

## 2015-09-06 DIAGNOSIS — M0579 Rheumatoid arthritis with rheumatoid factor of multiple sites without organ or systems involvement: Secondary | ICD-10-CM | POA: Diagnosis not present

## 2015-09-13 DIAGNOSIS — R918 Other nonspecific abnormal finding of lung field: Secondary | ICD-10-CM | POA: Diagnosis not present

## 2015-09-28 DIAGNOSIS — I2510 Atherosclerotic heart disease of native coronary artery without angina pectoris: Secondary | ICD-10-CM | POA: Diagnosis not present

## 2015-09-28 DIAGNOSIS — I10 Essential (primary) hypertension: Secondary | ICD-10-CM | POA: Diagnosis not present

## 2015-09-28 DIAGNOSIS — E119 Type 2 diabetes mellitus without complications: Secondary | ICD-10-CM | POA: Diagnosis not present

## 2015-09-28 DIAGNOSIS — M069 Rheumatoid arthritis, unspecified: Secondary | ICD-10-CM | POA: Diagnosis not present

## 2015-09-28 DIAGNOSIS — Z23 Encounter for immunization: Secondary | ICD-10-CM | POA: Diagnosis not present

## 2015-09-28 DIAGNOSIS — R809 Proteinuria, unspecified: Secondary | ICD-10-CM | POA: Diagnosis not present

## 2015-09-28 DIAGNOSIS — D649 Anemia, unspecified: Secondary | ICD-10-CM | POA: Diagnosis not present

## 2015-09-28 DIAGNOSIS — E780 Pure hypercholesterolemia: Secondary | ICD-10-CM | POA: Diagnosis not present

## 2015-10-03 DIAGNOSIS — M9904 Segmental and somatic dysfunction of sacral region: Secondary | ICD-10-CM | POA: Diagnosis not present

## 2015-10-03 DIAGNOSIS — S332XXA Dislocation of sacroiliac and sacrococcygeal joint, initial encounter: Secondary | ICD-10-CM | POA: Diagnosis not present

## 2015-10-07 DIAGNOSIS — H401132 Primary open-angle glaucoma, bilateral, moderate stage: Secondary | ICD-10-CM | POA: Diagnosis not present

## 2015-10-17 DIAGNOSIS — M9904 Segmental and somatic dysfunction of sacral region: Secondary | ICD-10-CM | POA: Diagnosis not present

## 2015-10-17 DIAGNOSIS — S332XXA Dislocation of sacroiliac and sacrococcygeal joint, initial encounter: Secondary | ICD-10-CM | POA: Diagnosis not present

## 2015-10-27 DIAGNOSIS — M069 Rheumatoid arthritis, unspecified: Secondary | ICD-10-CM | POA: Diagnosis not present

## 2015-10-27 DIAGNOSIS — D899 Disorder involving the immune mechanism, unspecified: Secondary | ICD-10-CM | POA: Diagnosis not present

## 2015-10-27 DIAGNOSIS — E119 Type 2 diabetes mellitus without complications: Secondary | ICD-10-CM | POA: Diagnosis not present

## 2015-10-27 DIAGNOSIS — J988 Other specified respiratory disorders: Secondary | ICD-10-CM | POA: Diagnosis not present

## 2015-11-08 DIAGNOSIS — M9902 Segmental and somatic dysfunction of thoracic region: Secondary | ICD-10-CM | POA: Diagnosis not present

## 2015-11-08 DIAGNOSIS — S23120A Subluxation of T2/T3 thoracic vertebra, initial encounter: Secondary | ICD-10-CM | POA: Diagnosis not present

## 2015-11-08 DIAGNOSIS — S332XXA Dislocation of sacroiliac and sacrococcygeal joint, initial encounter: Secondary | ICD-10-CM | POA: Diagnosis not present

## 2015-11-08 DIAGNOSIS — M9904 Segmental and somatic dysfunction of sacral region: Secondary | ICD-10-CM | POA: Diagnosis not present

## 2015-11-29 DIAGNOSIS — Z79899 Other long term (current) drug therapy: Secondary | ICD-10-CM | POA: Diagnosis not present

## 2015-11-29 DIAGNOSIS — R251 Tremor, unspecified: Secondary | ICD-10-CM | POA: Diagnosis not present

## 2015-11-29 DIAGNOSIS — M170 Bilateral primary osteoarthritis of knee: Secondary | ICD-10-CM | POA: Diagnosis not present

## 2015-11-29 DIAGNOSIS — M0579 Rheumatoid arthritis with rheumatoid factor of multiple sites without organ or systems involvement: Secondary | ICD-10-CM | POA: Diagnosis not present

## 2015-11-29 DIAGNOSIS — E559 Vitamin D deficiency, unspecified: Secondary | ICD-10-CM | POA: Diagnosis not present

## 2015-12-05 DIAGNOSIS — M9902 Segmental and somatic dysfunction of thoracic region: Secondary | ICD-10-CM | POA: Diagnosis not present

## 2015-12-05 DIAGNOSIS — M9904 Segmental and somatic dysfunction of sacral region: Secondary | ICD-10-CM | POA: Diagnosis not present

## 2015-12-05 DIAGNOSIS — S23120A Subluxation of T2/T3 thoracic vertebra, initial encounter: Secondary | ICD-10-CM | POA: Diagnosis not present

## 2015-12-05 DIAGNOSIS — S332XXA Dislocation of sacroiliac and sacrococcygeal joint, initial encounter: Secondary | ICD-10-CM | POA: Diagnosis not present

## 2015-12-27 DIAGNOSIS — S13130A Subluxation of C2/C3 cervical vertebrae, initial encounter: Secondary | ICD-10-CM | POA: Diagnosis not present

## 2015-12-27 DIAGNOSIS — M9904 Segmental and somatic dysfunction of sacral region: Secondary | ICD-10-CM | POA: Diagnosis not present

## 2015-12-27 DIAGNOSIS — M9901 Segmental and somatic dysfunction of cervical region: Secondary | ICD-10-CM | POA: Diagnosis not present

## 2015-12-27 DIAGNOSIS — S332XXA Dislocation of sacroiliac and sacrococcygeal joint, initial encounter: Secondary | ICD-10-CM | POA: Diagnosis not present

## 2016-01-12 DIAGNOSIS — Z7984 Long term (current) use of oral hypoglycemic drugs: Secondary | ICD-10-CM | POA: Diagnosis not present

## 2016-01-12 DIAGNOSIS — M545 Low back pain: Secondary | ICD-10-CM | POA: Diagnosis not present

## 2016-01-12 DIAGNOSIS — E1340 Other specified diabetes mellitus with diabetic neuropathy, unspecified: Secondary | ICD-10-CM | POA: Diagnosis not present

## 2016-01-12 DIAGNOSIS — I2510 Atherosclerotic heart disease of native coronary artery without angina pectoris: Secondary | ICD-10-CM | POA: Diagnosis not present

## 2016-01-12 DIAGNOSIS — E782 Mixed hyperlipidemia: Secondary | ICD-10-CM | POA: Diagnosis not present

## 2016-01-12 DIAGNOSIS — R609 Edema, unspecified: Secondary | ICD-10-CM | POA: Diagnosis not present

## 2016-01-12 DIAGNOSIS — I10 Essential (primary) hypertension: Secondary | ICD-10-CM | POA: Diagnosis not present

## 2016-01-12 DIAGNOSIS — E1165 Type 2 diabetes mellitus with hyperglycemia: Secondary | ICD-10-CM | POA: Diagnosis not present

## 2016-01-12 DIAGNOSIS — E1140 Type 2 diabetes mellitus with diabetic neuropathy, unspecified: Secondary | ICD-10-CM | POA: Diagnosis not present

## 2016-01-12 DIAGNOSIS — R251 Tremor, unspecified: Secondary | ICD-10-CM | POA: Diagnosis not present

## 2016-01-12 DIAGNOSIS — N400 Enlarged prostate without lower urinary tract symptoms: Secondary | ICD-10-CM | POA: Diagnosis not present

## 2016-01-23 DIAGNOSIS — Z794 Long term (current) use of insulin: Secondary | ICD-10-CM | POA: Diagnosis not present

## 2016-01-23 DIAGNOSIS — E1122 Type 2 diabetes mellitus with diabetic chronic kidney disease: Secondary | ICD-10-CM | POA: Diagnosis not present

## 2016-01-23 DIAGNOSIS — I2510 Atherosclerotic heart disease of native coronary artery without angina pectoris: Secondary | ICD-10-CM | POA: Diagnosis not present

## 2016-01-23 DIAGNOSIS — E1340 Other specified diabetes mellitus with diabetic neuropathy, unspecified: Secondary | ICD-10-CM | POA: Diagnosis not present

## 2016-01-23 DIAGNOSIS — I10 Essential (primary) hypertension: Secondary | ICD-10-CM | POA: Diagnosis not present

## 2016-01-23 DIAGNOSIS — E782 Mixed hyperlipidemia: Secondary | ICD-10-CM | POA: Diagnosis not present

## 2016-01-23 DIAGNOSIS — N183 Chronic kidney disease, stage 3 (moderate): Secondary | ICD-10-CM | POA: Diagnosis not present

## 2016-01-23 DIAGNOSIS — G4730 Sleep apnea, unspecified: Secondary | ICD-10-CM | POA: Diagnosis not present

## 2016-01-30 DIAGNOSIS — M9901 Segmental and somatic dysfunction of cervical region: Secondary | ICD-10-CM | POA: Diagnosis not present

## 2016-01-30 DIAGNOSIS — S332XXA Dislocation of sacroiliac and sacrococcygeal joint, initial encounter: Secondary | ICD-10-CM | POA: Diagnosis not present

## 2016-01-30 DIAGNOSIS — M9904 Segmental and somatic dysfunction of sacral region: Secondary | ICD-10-CM | POA: Diagnosis not present

## 2016-01-30 DIAGNOSIS — S13130A Subluxation of C2/C3 cervical vertebrae, initial encounter: Secondary | ICD-10-CM | POA: Diagnosis not present

## 2016-02-08 DIAGNOSIS — J84112 Idiopathic pulmonary fibrosis: Secondary | ICD-10-CM | POA: Diagnosis not present

## 2016-02-08 DIAGNOSIS — G4733 Obstructive sleep apnea (adult) (pediatric): Secondary | ICD-10-CM | POA: Diagnosis not present

## 2016-02-08 DIAGNOSIS — R938 Abnormal findings on diagnostic imaging of other specified body structures: Secondary | ICD-10-CM | POA: Diagnosis not present

## 2016-02-27 DIAGNOSIS — S332XXA Dislocation of sacroiliac and sacrococcygeal joint, initial encounter: Secondary | ICD-10-CM | POA: Diagnosis not present

## 2016-02-27 DIAGNOSIS — M9904 Segmental and somatic dysfunction of sacral region: Secondary | ICD-10-CM | POA: Diagnosis not present

## 2016-02-27 DIAGNOSIS — M9901 Segmental and somatic dysfunction of cervical region: Secondary | ICD-10-CM | POA: Diagnosis not present

## 2016-02-27 DIAGNOSIS — S13130A Subluxation of C2/C3 cervical vertebrae, initial encounter: Secondary | ICD-10-CM | POA: Diagnosis not present

## 2016-02-28 DIAGNOSIS — M25541 Pain in joints of right hand: Secondary | ICD-10-CM | POA: Diagnosis not present

## 2016-02-28 DIAGNOSIS — M0579 Rheumatoid arthritis with rheumatoid factor of multiple sites without organ or systems involvement: Secondary | ICD-10-CM | POA: Diagnosis not present

## 2016-02-28 DIAGNOSIS — M25542 Pain in joints of left hand: Secondary | ICD-10-CM | POA: Diagnosis not present

## 2016-02-28 DIAGNOSIS — M7072 Other bursitis of hip, left hip: Secondary | ICD-10-CM | POA: Diagnosis not present

## 2016-02-28 DIAGNOSIS — M7071 Other bursitis of hip, right hip: Secondary | ICD-10-CM | POA: Diagnosis not present

## 2016-02-28 DIAGNOSIS — Z79899 Other long term (current) drug therapy: Secondary | ICD-10-CM | POA: Diagnosis not present

## 2016-02-29 DIAGNOSIS — I4819 Other persistent atrial fibrillation: Secondary | ICD-10-CM

## 2016-02-29 DIAGNOSIS — A419 Sepsis, unspecified organism: Secondary | ICD-10-CM

## 2016-02-29 HISTORY — DX: Severe sepsis with septic shock: A41.9

## 2016-02-29 HISTORY — DX: Other persistent atrial fibrillation: I48.19

## 2016-03-13 DIAGNOSIS — H401112 Primary open-angle glaucoma, right eye, moderate stage: Secondary | ICD-10-CM | POA: Diagnosis not present

## 2016-03-13 DIAGNOSIS — H401122 Primary open-angle glaucoma, left eye, moderate stage: Secondary | ICD-10-CM | POA: Diagnosis not present

## 2016-03-18 ENCOUNTER — Emergency Department (HOSPITAL_COMMUNITY): Payer: Medicare Other

## 2016-03-18 ENCOUNTER — Inpatient Hospital Stay (HOSPITAL_COMMUNITY)
Admission: EM | Admit: 2016-03-18 | Discharge: 2016-04-02 | DRG: 871 | Disposition: A | Discharge status: Skilled Nursing Facility | Payer: Medicare Other | Attending: Family Medicine | Admitting: Family Medicine

## 2016-03-18 ENCOUNTER — Encounter (HOSPITAL_COMMUNITY): Payer: Self-pay | Admitting: Emergency Medicine

## 2016-03-18 DIAGNOSIS — D649 Anemia, unspecified: Secondary | ICD-10-CM | POA: Diagnosis not present

## 2016-03-18 DIAGNOSIS — E870 Hyperosmolality and hypernatremia: Secondary | ICD-10-CM | POA: Diagnosis present

## 2016-03-18 DIAGNOSIS — E876 Hypokalemia: Secondary | ICD-10-CM | POA: Diagnosis present

## 2016-03-18 DIAGNOSIS — Z8673 Personal history of transient ischemic attack (TIA), and cerebral infarction without residual deficits: Secondary | ICD-10-CM

## 2016-03-18 DIAGNOSIS — Z79899 Other long term (current) drug therapy: Secondary | ICD-10-CM | POA: Diagnosis not present

## 2016-03-18 DIAGNOSIS — E1165 Type 2 diabetes mellitus with hyperglycemia: Secondary | ICD-10-CM | POA: Diagnosis present

## 2016-03-18 DIAGNOSIS — E785 Hyperlipidemia, unspecified: Secondary | ICD-10-CM | POA: Diagnosis present

## 2016-03-18 DIAGNOSIS — R918 Other nonspecific abnormal finding of lung field: Secondary | ICD-10-CM | POA: Diagnosis not present

## 2016-03-18 DIAGNOSIS — F411 Generalized anxiety disorder: Secondary | ICD-10-CM | POA: Diagnosis present

## 2016-03-18 DIAGNOSIS — E119 Type 2 diabetes mellitus without complications: Secondary | ICD-10-CM | POA: Diagnosis not present

## 2016-03-18 DIAGNOSIS — G253 Myoclonus: Secondary | ICD-10-CM | POA: Diagnosis present

## 2016-03-18 DIAGNOSIS — K219 Gastro-esophageal reflux disease without esophagitis: Secondary | ICD-10-CM | POA: Diagnosis present

## 2016-03-18 DIAGNOSIS — I255 Ischemic cardiomyopathy: Secondary | ICD-10-CM | POA: Diagnosis present

## 2016-03-18 DIAGNOSIS — R4182 Altered mental status, unspecified: Secondary | ICD-10-CM | POA: Diagnosis not present

## 2016-03-18 DIAGNOSIS — I639 Cerebral infarction, unspecified: Secondary | ICD-10-CM | POA: Diagnosis not present

## 2016-03-18 DIAGNOSIS — Z91041 Radiographic dye allergy status: Secondary | ICD-10-CM | POA: Diagnosis not present

## 2016-03-18 DIAGNOSIS — E1140 Type 2 diabetes mellitus with diabetic neuropathy, unspecified: Secondary | ICD-10-CM | POA: Diagnosis present

## 2016-03-18 DIAGNOSIS — I959 Hypotension, unspecified: Secondary | ICD-10-CM

## 2016-03-18 DIAGNOSIS — E669 Obesity, unspecified: Secondary | ICD-10-CM | POA: Diagnosis present

## 2016-03-18 DIAGNOSIS — F1910 Other psychoactive substance abuse, uncomplicated: Secondary | ICD-10-CM | POA: Diagnosis present

## 2016-03-18 DIAGNOSIS — R0602 Shortness of breath: Secondary | ICD-10-CM | POA: Diagnosis not present

## 2016-03-18 DIAGNOSIS — R296 Repeated falls: Secondary | ICD-10-CM | POA: Diagnosis present

## 2016-03-18 DIAGNOSIS — R6520 Severe sepsis without septic shock: Secondary | ICD-10-CM | POA: Insufficient documentation

## 2016-03-18 DIAGNOSIS — R600 Localized edema: Secondary | ICD-10-CM

## 2016-03-18 DIAGNOSIS — R5383 Other fatigue: Secondary | ICD-10-CM

## 2016-03-18 DIAGNOSIS — E1122 Type 2 diabetes mellitus with diabetic chronic kidney disease: Secondary | ICD-10-CM | POA: Diagnosis present

## 2016-03-18 DIAGNOSIS — Z794 Long term (current) use of insulin: Secondary | ICD-10-CM | POA: Diagnosis not present

## 2016-03-18 DIAGNOSIS — G250 Essential tremor: Secondary | ICD-10-CM | POA: Diagnosis present

## 2016-03-18 DIAGNOSIS — N179 Acute kidney failure, unspecified: Secondary | ICD-10-CM | POA: Diagnosis not present

## 2016-03-18 DIAGNOSIS — R451 Restlessness and agitation: Secondary | ICD-10-CM | POA: Diagnosis present

## 2016-03-18 DIAGNOSIS — G8929 Other chronic pain: Secondary | ICD-10-CM | POA: Diagnosis present

## 2016-03-18 DIAGNOSIS — H540 Blindness, both eyes: Secondary | ICD-10-CM | POA: Diagnosis present

## 2016-03-18 DIAGNOSIS — I2510 Atherosclerotic heart disease of native coronary artery without angina pectoris: Secondary | ICD-10-CM | POA: Diagnosis present

## 2016-03-18 DIAGNOSIS — G9341 Metabolic encephalopathy: Secondary | ICD-10-CM | POA: Diagnosis present

## 2016-03-18 DIAGNOSIS — J9600 Acute respiratory failure, unspecified whether with hypoxia or hypercapnia: Secondary | ICD-10-CM | POA: Diagnosis not present

## 2016-03-18 DIAGNOSIS — M069 Rheumatoid arthritis, unspecified: Secondary | ICD-10-CM | POA: Diagnosis present

## 2016-03-18 DIAGNOSIS — I252 Old myocardial infarction: Secondary | ICD-10-CM

## 2016-03-18 DIAGNOSIS — R2681 Unsteadiness on feet: Secondary | ICD-10-CM | POA: Diagnosis not present

## 2016-03-18 DIAGNOSIS — I4891 Unspecified atrial fibrillation: Secondary | ICD-10-CM | POA: Diagnosis present

## 2016-03-18 DIAGNOSIS — Z880 Allergy status to penicillin: Secondary | ICD-10-CM

## 2016-03-18 DIAGNOSIS — Z66 Do not resuscitate: Secondary | ICD-10-CM | POA: Diagnosis present

## 2016-03-18 DIAGNOSIS — Z6826 Body mass index (BMI) 26.0-26.9, adult: Secondary | ICD-10-CM

## 2016-03-18 DIAGNOSIS — Z7902 Long term (current) use of antithrombotics/antiplatelets: Secondary | ICD-10-CM

## 2016-03-18 DIAGNOSIS — I130 Hypertensive heart and chronic kidney disease with heart failure and stage 1 through stage 4 chronic kidney disease, or unspecified chronic kidney disease: Secondary | ICD-10-CM | POA: Diagnosis present

## 2016-03-18 DIAGNOSIS — M0609 Rheumatoid arthritis without rheumatoid factor, multiple sites: Secondary | ICD-10-CM | POA: Diagnosis present

## 2016-03-18 DIAGNOSIS — G4733 Obstructive sleep apnea (adult) (pediatric): Secondary | ICD-10-CM | POA: Diagnosis present

## 2016-03-18 DIAGNOSIS — I5042 Chronic combined systolic (congestive) and diastolic (congestive) heart failure: Secondary | ICD-10-CM | POA: Diagnosis not present

## 2016-03-18 DIAGNOSIS — I5043 Acute on chronic combined systolic (congestive) and diastolic (congestive) heart failure: Secondary | ICD-10-CM | POA: Diagnosis present

## 2016-03-18 DIAGNOSIS — R509 Fever, unspecified: Secondary | ICD-10-CM | POA: Diagnosis not present

## 2016-03-18 DIAGNOSIS — Z5189 Encounter for other specified aftercare: Secondary | ICD-10-CM | POA: Diagnosis not present

## 2016-03-18 DIAGNOSIS — J9601 Acute respiratory failure with hypoxia: Secondary | ICD-10-CM | POA: Diagnosis not present

## 2016-03-18 DIAGNOSIS — J9690 Respiratory failure, unspecified, unspecified whether with hypoxia or hypercapnia: Secondary | ICD-10-CM | POA: Diagnosis not present

## 2016-03-18 DIAGNOSIS — Z978 Presence of other specified devices: Secondary | ICD-10-CM

## 2016-03-18 DIAGNOSIS — E872 Acidosis: Secondary | ICD-10-CM | POA: Diagnosis present

## 2016-03-18 DIAGNOSIS — M6281 Muscle weakness (generalized): Secondary | ICD-10-CM | POA: Diagnosis not present

## 2016-03-18 DIAGNOSIS — N183 Chronic kidney disease, stage 3 (moderate): Secondary | ICD-10-CM | POA: Diagnosis not present

## 2016-03-18 DIAGNOSIS — R6521 Severe sepsis with septic shock: Secondary | ICD-10-CM | POA: Diagnosis not present

## 2016-03-18 DIAGNOSIS — R7989 Other specified abnormal findings of blood chemistry: Secondary | ICD-10-CM | POA: Diagnosis not present

## 2016-03-18 DIAGNOSIS — Z888 Allergy status to other drugs, medicaments and biological substances status: Secondary | ICD-10-CM

## 2016-03-18 DIAGNOSIS — Z7952 Long term (current) use of systemic steroids: Secondary | ICD-10-CM | POA: Diagnosis not present

## 2016-03-18 DIAGNOSIS — Z9119 Patient's noncompliance with other medical treatment and regimen: Secondary | ICD-10-CM

## 2016-03-18 DIAGNOSIS — M7989 Other specified soft tissue disorders: Secondary | ICD-10-CM | POA: Diagnosis not present

## 2016-03-18 DIAGNOSIS — R0600 Dyspnea, unspecified: Secondary | ICD-10-CM | POA: Diagnosis not present

## 2016-03-18 DIAGNOSIS — I5022 Chronic systolic (congestive) heart failure: Secondary | ICD-10-CM | POA: Diagnosis not present

## 2016-03-18 DIAGNOSIS — J189 Pneumonia, unspecified organism: Secondary | ICD-10-CM | POA: Diagnosis present

## 2016-03-18 DIAGNOSIS — Z955 Presence of coronary angioplasty implant and graft: Secondary | ICD-10-CM | POA: Diagnosis not present

## 2016-03-18 DIAGNOSIS — I248 Other forms of acute ischemic heart disease: Secondary | ICD-10-CM | POA: Diagnosis present

## 2016-03-18 DIAGNOSIS — Z4682 Encounter for fitting and adjustment of non-vascular catheter: Secondary | ICD-10-CM | POA: Diagnosis not present

## 2016-03-18 DIAGNOSIS — A419 Sepsis, unspecified organism: Principal | ICD-10-CM | POA: Diagnosis present

## 2016-03-18 DIAGNOSIS — I5023 Acute on chronic systolic (congestive) heart failure: Secondary | ICD-10-CM | POA: Diagnosis not present

## 2016-03-18 DIAGNOSIS — I481 Persistent atrial fibrillation: Secondary | ICD-10-CM | POA: Diagnosis not present

## 2016-03-18 HISTORY — DX: Essential (primary) hypertension: I10

## 2016-03-18 HISTORY — DX: Acute myocardial infarction, unspecified: I21.9

## 2016-03-18 LAB — DIFFERENTIAL
BASOS PCT: 0 %
Basophils Absolute: 0 10*3/uL (ref 0.0–0.1)
EOS ABS: 0 10*3/uL (ref 0.0–0.7)
Eosinophils Relative: 0 %
LYMPHS ABS: 1.2 10*3/uL (ref 0.7–4.0)
LYMPHS PCT: 7 %
MONO ABS: 0.7 10*3/uL (ref 0.1–1.0)
Monocytes Relative: 4 %
NEUTROS ABS: 14.8 10*3/uL — AB (ref 1.7–7.7)
NEUTROS PCT: 89 %

## 2016-03-18 LAB — COMPREHENSIVE METABOLIC PANEL
ALT: 19 U/L (ref 17–63)
ANION GAP: 11 (ref 5–15)
AST: 37 U/L (ref 15–41)
Albumin: 2.8 g/dL — ABNORMAL LOW (ref 3.5–5.0)
Alkaline Phosphatase: 42 U/L (ref 38–126)
BUN: 19 mg/dL (ref 6–20)
CHLORIDE: 102 mmol/L (ref 101–111)
CO2: 22 mmol/L (ref 22–32)
CREATININE: 2.13 mg/dL — AB (ref 0.61–1.24)
Calcium: 9.1 mg/dL (ref 8.9–10.3)
GFR, EST AFRICAN AMERICAN: 35 mL/min — AB (ref >60)
GFR, EST NON AFRICAN AMERICAN: 30 mL/min — AB (ref >60)
Glucose, Bld: 278 mg/dL — ABNORMAL HIGH (ref 65–99)
Potassium: 4.4 mmol/L (ref 3.5–5.1)
SODIUM: 135 mmol/L (ref 135–145)
Total Bilirubin: 0.7 mg/dL (ref 0.3–1.2)
Total Protein: 6 g/dL — ABNORMAL LOW (ref 6.5–8.1)

## 2016-03-18 LAB — CBC
HCT: 30.8 % — ABNORMAL LOW (ref 39.0–52.0)
Hemoglobin: 10.2 g/dL — ABNORMAL LOW (ref 13.0–17.0)
MCH: 30.3 pg (ref 26.0–34.0)
MCHC: 33.1 g/dL (ref 30.0–36.0)
MCV: 91.4 fL (ref 78.0–100.0)
PLATELETS: 192 10*3/uL (ref 150–400)
RBC: 3.37 MIL/uL — AB (ref 4.22–5.81)
RDW: 14.1 % (ref 11.5–15.5)
WBC: 16.7 10*3/uL — ABNORMAL HIGH (ref 4.0–10.5)

## 2016-03-18 LAB — I-STAT CHEM 8, ED
BUN: 21 mg/dL — AB (ref 6–20)
CALCIUM ION: 1.18 mmol/L (ref 1.13–1.30)
CREATININE: 2.1 mg/dL — AB (ref 0.61–1.24)
Chloride: 100 mmol/L — ABNORMAL LOW (ref 101–111)
Glucose, Bld: 266 mg/dL — ABNORMAL HIGH (ref 65–99)
HEMATOCRIT: 33 % — AB (ref 39.0–52.0)
HEMOGLOBIN: 11.2 g/dL — AB (ref 13.0–17.0)
Potassium: 4.3 mmol/L (ref 3.5–5.1)
SODIUM: 134 mmol/L — AB (ref 135–145)
TCO2: 21 mmol/L (ref 0–100)

## 2016-03-18 LAB — I-STAT CG4 LACTIC ACID, ED
LACTIC ACID, VENOUS: 4.52 mmol/L — AB (ref 0.5–2.0)
LACTIC ACID, VENOUS: 2.6 mmol/L — AB (ref 0.5–2.0)
Lactic Acid, Venous: 3.02 mmol/L (ref 0.5–2.0)

## 2016-03-18 LAB — APTT: aPTT: 33 seconds (ref 24–37)

## 2016-03-18 LAB — TROPONIN I: TROPONIN I: 0.13 ng/mL — AB (ref <0.031)

## 2016-03-18 LAB — PROTIME-INR
INR: 1.29 (ref 0.00–1.49)
PROTHROMBIN TIME: 16.2 s — AB (ref 11.6–15.2)

## 2016-03-18 LAB — TSH: TSH: 0.501 u[IU]/mL (ref 0.350–4.500)

## 2016-03-18 LAB — PROCALCITONIN: PROCALCITONIN: 1.23 ng/mL

## 2016-03-18 LAB — INFLUENZA PANEL BY PCR (TYPE A & B)
H1N1FLUPCR: NOT DETECTED
INFLAPCR: NEGATIVE
INFLBPCR: NEGATIVE

## 2016-03-18 LAB — CBG MONITORING, ED
GLUCOSE-CAPILLARY: 237 mg/dL — AB (ref 65–99)
Glucose-Capillary: 232 mg/dL — ABNORMAL HIGH (ref 65–99)
Glucose-Capillary: 235 mg/dL — ABNORMAL HIGH (ref 65–99)

## 2016-03-18 LAB — LACTIC ACID, PLASMA: Lactic Acid, Venous: 2.6 mmol/L (ref 0.5–2.0)

## 2016-03-18 LAB — I-STAT TROPONIN, ED: TROPONIN I, POC: 0.13 ng/mL — AB (ref 0.00–0.08)

## 2016-03-18 LAB — MAGNESIUM: MAGNESIUM: 2 mg/dL (ref 1.7–2.4)

## 2016-03-18 MED ORDER — VANCOMYCIN HCL IN DEXTROSE 1-5 GM/200ML-% IV SOLN
1000.0000 mg | Freq: Once | INTRAVENOUS | Status: AC
  Administered 2016-03-18: 1000 mg via INTRAVENOUS
  Filled 2016-03-18: qty 200

## 2016-03-18 MED ORDER — HEPARIN SODIUM (PORCINE) 5000 UNIT/ML IJ SOLN: 5000.0000 [IU] | Freq: Three times a day (TID) | INTRAMUSCULAR | Status: DC

## 2016-03-18 MED ORDER — IPRATROPIUM-ALBUTEROL 0.5-2.5 (3) MG/3ML IN SOLN
3.0000 mL | RESPIRATORY_TRACT | Status: DC | PRN
  Administered 2016-03-19: 3 mL via RESPIRATORY_TRACT
  Filled 2016-03-18: qty 3

## 2016-03-18 MED ORDER — ASPIRIN 81 MG PO CHEW
324.0000 mg | CHEWABLE_TABLET | Freq: Once | ORAL | Status: AC
  Administered 2016-03-18: 324 mg via ORAL
  Filled 2016-03-18: qty 4

## 2016-03-18 MED ORDER — SODIUM CHLORIDE 0.9 % IV BOLUS (SEPSIS)
1000.0000 mL | INTRAVENOUS | Status: AC
  Administered 2016-03-18 (×3): 1000 mL via INTRAVENOUS

## 2016-03-18 MED ORDER — GUAIFENESIN ER 600 MG PO TB12
600.0000 mg | ORAL_TABLET | Freq: Two times a day (BID) | ORAL | Status: DC
  Administered 2016-03-18 – 2016-03-19 (×2): 600 mg via ORAL
  Filled 2016-03-18 (×4): qty 1

## 2016-03-18 MED ORDER — ACETAMINOPHEN 325 MG PO TABS
650.0000 mg | ORAL_TABLET | Freq: Once | ORAL | Status: AC
  Administered 2016-03-18: 650 mg via ORAL
  Filled 2016-03-18: qty 2

## 2016-03-18 MED ORDER — VANCOMYCIN HCL 10 G IV SOLR
1250.0000 mg | INTRAVENOUS | Status: DC
  Administered 2016-03-19 – 2016-03-20 (×2): 1250 mg via INTRAVENOUS
  Filled 2016-03-18 (×4): qty 1250

## 2016-03-18 MED ORDER — LEVOFLOXACIN IN D5W 750 MG/150ML IV SOLN
750.0000 mg | Freq: Once | INTRAVENOUS | Status: AC
Start: 2016-03-18 — End: 2016-03-18
  Administered 2016-03-18: 750 mg via INTRAVENOUS
  Filled 2016-03-18: qty 150

## 2016-03-18 MED ORDER — INSULIN ASPART 100 UNIT/ML ~~LOC~~ SOLN
0.0000 [IU] | SUBCUTANEOUS | Status: DC
  Administered 2016-03-18: 5 [IU] via SUBCUTANEOUS
  Administered 2016-03-19 (×2): 2 [IU] via SUBCUTANEOUS
  Administered 2016-03-19: 5 [IU] via SUBCUTANEOUS
  Administered 2016-03-19: 2 [IU] via SUBCUTANEOUS
  Administered 2016-03-20: 5 [IU] via SUBCUTANEOUS
  Administered 2016-03-20: 8 [IU] via SUBCUTANEOUS
  Filled 2016-03-18 (×4): qty 1

## 2016-03-18 MED ORDER — SODIUM CHLORIDE 0.9 % IV SOLN
1500.0000 mg | Freq: Once | INTRAVENOUS | Status: AC
  Administered 2016-03-18: 1500 mg via INTRAVENOUS
  Filled 2016-03-18 (×2): qty 1500

## 2016-03-18 MED ORDER — SODIUM CHLORIDE 0.9 % IV BOLUS (SEPSIS)
1000.0000 mL | Freq: Once | INTRAVENOUS | Status: AC
  Administered 2016-03-18: 1000 mL via INTRAVENOUS

## 2016-03-18 MED ORDER — IPRATROPIUM-ALBUTEROL 0.5-2.5 (3) MG/3ML IN SOLN
3.0000 mL | Freq: Four times a day (QID) | RESPIRATORY_TRACT | Status: DC
  Administered 2016-03-18 – 2016-03-22 (×15): 3 mL via RESPIRATORY_TRACT
  Filled 2016-03-18 (×16): qty 3

## 2016-03-18 MED ORDER — DEXTROSE 5 % IV SOLN
2.0000 g | Freq: Three times a day (TID) | INTRAVENOUS | Status: AC
  Administered 2016-03-18 – 2016-03-23 (×16): 2 g via INTRAVENOUS
  Filled 2016-03-18 (×16): qty 2

## 2016-03-18 MED ORDER — LEVOFLOXACIN IN D5W 750 MG/150ML IV SOLN
750.0000 mg | INTRAVENOUS | Status: DC
  Administered 2016-03-20: 750 mg via INTRAVENOUS
  Filled 2016-03-18: qty 150

## 2016-03-18 MED ORDER — DEXTROSE 5 % IV SOLN
2.0000 g | Freq: Once | INTRAVENOUS | Status: AC
  Administered 2016-03-18: 2 g via INTRAVENOUS
  Filled 2016-03-18: qty 2

## 2016-03-18 NOTE — ED Provider Notes (Signed)
CSN: [id number]     Arrival date & time 03/18/16  1551 History   First MD Initiated Contact with Patient 03/18/16 1624     Chief Complaint  Patient presents with  . Weakness  . Altered Mental Status     (Consider location/radiation/quality/duration/timing/severity/associated sxs/prior Treatment) Patient is a 70 y.o. male presenting with altered mental status. The history is provided by the patient and a relative.  Altered Mental Status Presenting symptoms: behavior changes and confusion   Severity:  Severe Most recent episode:  Today Episode history:  Single Duration:  4 hours Timing:  Constant Progression:  Worsening Chronicity:  New Context comment:  Patient talking "loopy", confused. Also generalized weakness and hypoxia at home Associated symptoms: fever and weakness   Associated symptoms: no headaches, no rash, no seizures and no slurred speech   Associated symptoms comment:  + cough   Past Medical History  Diagnosis Date  . MI (myocardial infarction) ([place])   . Diabetes mellitus without complication ([place])   . Hypertension    Past Surgical History  Procedure Laterality Date  . Coronary angioplasty with stent placement      x 10  . Angioplasty     No family history on file. Social History  Substance Use Topics  . Smoking status: Never Smoker   . Smokeless tobacco: None  . Alcohol Use: No    Review of Systems  Unable to perform ROS: Mental status change  Constitutional: Positive for fever.  Skin: Negative for rash.  Neurological: Positive for weakness. Negative for seizures and headaches.  Psychiatric/Behavioral: Positive for confusion.      Allergies  Atenolol; Contrast media; and Penicillins  Home Medications   Prior to Admission medications   Not on File   BP 81/59 mmHg  Pulse 86  Temp(Src) 102.8 F (39.3 C) (Rectal)  Resp 16  Ht 5\' 11"  (1.803 m)  Wt 108.863 kg  BMI 33.49 kg/m2  SpO2 89% Physical Exam  Constitutional: He appears  well-developed and well-nourished. He appears distressed.  Elderly male with mild distress and confusion.  HENT:  Head: Normocephalic and atraumatic.  Right Ear: External ear normal.  Left Ear: External ear normal.  Eyes: Conjunctivae and EOM are normal. Pupils are equal, round, and reactive to light. No scleral icterus.  Neck: Normal range of motion. Neck supple. No JVD present. No tracheal deviation present. No thyromegaly present.  Cardiovascular: Normal rate and intact distal pulses.   Irregular heart rhythm with rate in 90's  Pulmonary/Chest: Effort normal and breath sounds normal. No stridor. He has no rales.  Patient tachypnic to mid 20's. No wheezing or rales appreciated on exam   Abdominal: Soft. He exhibits no distension. There is no tenderness. There is no rebound and no guarding.  Musculoskeletal: Normal range of motion. He exhibits edema (mild non-pitting edema in BLE).  Neurological: He is alert. No cranial nerve deficit. He exhibits normal muscle tone. GCS eye subscore is 4. GCS verbal subscore is 4. GCS motor subscore is 6.  Hyperactive. Alert. Oriented to person place or time. Follows commands but confused with speech. No facial droop. 5/5 strength in all 4 extremities.    Skin: Skin is warm and dry.  Nursing note and vitals reviewed.   ED Course  Procedures (including critical care time) Labs Review Labs Reviewed  PROTIME-INR - Abnormal; Notable for the following:    Prothrombin Time 16.2 (*)    All other components within normal limits  CBC - Abnormal; Notable for the  following:    WBC 16.7 (*)    RBC 3.37 (*)    Hemoglobin 10.2 (*)    HCT 30.8 (*)    All other components within normal limits  DIFFERENTIAL - Abnormal; Notable for the following:    Neutro Abs 14.8 (*)    All other components within normal limits  COMPREHENSIVE METABOLIC PANEL - Abnormal; Notable for the following:    Glucose, Bld 278 (*)    Creatinine, Ser 2.13 (*)    Total Protein 6.0 (*)     Albumin 2.8 (*)    GFR calc non Af Amer 30 (*)    GFR calc Af Amer 35 (*)    All other components within normal limits  I-STAT TROPOININ, ED - Abnormal; Notable for the following:    Troponin i, poc 0.13 (*)    All other components within normal limits  CBG MONITORING, ED - Abnormal; Notable for the following:    Glucose-Capillary 232 (*)    All other components within normal limits  I-STAT CHEM 8, ED - Abnormal; Notable for the following:    Sodium 134 (*)    Chloride 100 (*)    BUN 21 (*)    Creatinine, Ser 2.10 (*)    Glucose, Bld 266 (*)    Hemoglobin 11.2 (*)    HCT 33.0 (*)    All other components within normal limits  I-STAT CG4 LACTIC ACID, ED - Abnormal; Notable for the following:    Lactic Acid, Venous 3.02 (*)    All other components within normal limits  I-STAT CG4 LACTIC ACID, ED - Abnormal; Notable for the following:    Lactic Acid, Venous 4.52 (*)    All other components within normal limits  CULTURE, BLOOD (ROUTINE X 2)  CULTURE, BLOOD (ROUTINE X 2)  URINE CULTURE  APTT  URINALYSIS, ROUTINE W REFLEX MICROSCOPIC (NOT AT [facility])  INFLUENZA PANEL BY PCR (TYPE A & B, H1N1)  I-STAT CG4 LACTIC ACID, ED  I-STAT CG4 LACTIC ACID, ED    Imaging Review Ct Head Wo Contrast  03/18/2016  CLINICAL DATA:  STROKE -LIKE SYMPTOMS-PLEASE EVALUATE FOR STROKE. Time course is not indicated. EXAM: CT HEAD WITHOUT CONTRAST TECHNIQUE: Contiguous axial images were obtained from the base of the skull through the vertex without intravenous contrast. COMPARISON:  None. FINDINGS: Diffuse cerebral atrophy. Ventricular dilatation consistent with central atrophy. Patchy areas of low-attenuation change in the deep white matter consistent with small vessel ischemia. No mass effect or midline shift. No abnormal extra-axial fluid collections. Gray-white matter junctions are distinct. Basal cisterns are not effaced. No evidence of acute intracranial hemorrhage. No depressed skull fractures. Mucosal  thickening throughout the left maxillary antrum, left ethmoid air cells, left frontal, and left sphenoid sinuses. Minimal mucosal thickening in the right paranasal sinuses. Visualized mastoid air cells are not opacified. Vascular calcifications. IMPRESSION: No acute intracranial abnormalities. Chronic atrophy and small vessel ischemic changes. Electronically Signed   By: Lucienne Capers M.D.   On: 03/18/2016 18:19   Dg Chest Portable 1 View  03/18/2016  CLINICAL DATA:  Fever, altered mental status, history of hypertension and diabetes EXAM: PORTABLE CHEST 1 VIEW COMPARISON:  None. FINDINGS: There is extensive infiltrate over the left lung. This obscures the left heart border. Heart size is therefore difficult to establish but appears mildly large. To a lesser degree there is patchy multifocal right-sided infiltrate. No pleural effusions. IMPRESSION: Bilateral left greater than right infiltrates. Bilateral pneumonia is suspected. Radiographic follow-up recommended. Electronically Signed  By: Skipper Cliche M.D.   On: 03/18/2016 18:09   I have personally reviewed and evaluated these images and lab results as part of my medical decision-making.   EKG Interpretation   Date/Time:  Sunday March 18 2016 16:16:21 EDT Ventricular Rate:  85 PR Interval:    QRS Duration: 96 QT Interval:  354 QTC Calculation: 421 R Axis:   71 Text Interpretation:  Atrial fibrillation Abnormal ECG Confirmed by ZAVITZ   MD, JOSHUA ([id number]) on 03/18/2016 4:18:42 PM      MDM   Final diagnoses:  Sepsis, unspecified organism ([place])  Atrial fibrillation, new onset ([place])  Hypotension, unspecified  Septic shock ([place])    The patient is a 70 year old male with a history of hypertension, diabetes, and previous pneumonia who presents with one-day of confusion. Patient reports cough for the last couple days. Arrival patient is hypotensive and hypoxic. This is improved with IV fluids and oxygen via nasal cannula. Laboratory  evaluation concerning for sepsis from pneumonia. Patient's improved after initial treatment. Cultures drawn and white spectrum antibiotic started. Patient admitted to stepdown unit for further care of sepsis or septic shock.  Patient seen with attending, Dr. Reather Converse, who oversaw clinical decision making.     Margaretann Loveless, MD 03/18/16 1919  Elnora Morrison, MD 03/19/16 Lupita Shutter

## 2016-03-18 NOTE — ED Notes (Addendum)
Family reports last night pt was talking "loopy."  Today family talked to pt around 68 by phone and he seemed fine. About 1430 today they talked to pt again and he was confused. Family feel pt may have taken too much of his medication. Pt also has weakness. Pulse ox 87 in triage on 2 L O2.

## 2016-03-18 NOTE — ED Notes (Signed)
Dr. Harvest Forest asked this RN to call pts son to ask him to bring back pts medications so pharmacy can review and update list. This RN called and left a voicemail with pts son, Dr. Greggory Keen.

## 2016-03-18 NOTE — ED Notes (Signed)
All V/S reported to Dr. Hillard Danker

## 2016-03-18 NOTE — ED Notes (Signed)
Pt evaluated by Dr Tomi Bamberger for possible code stroke or code sepsis. Pt does not meet criteria at this time.

## 2016-03-18 NOTE — H&P (Signed)
[facility] History and Physical  Julian Ross [id number] DOB: [birth year] DOA: 03/18/2016  Referring physician: ED PCP: No PCP Per Patient   Chief Complaint: Shortness of breath  HPI:  Julian Ross is a 70 year old male with a complicated past medical history including HTN, HLD, diabetes mellitus type 2, MI, CAD s/p PCI; who presents with progressively worsening shortness of breath over the last 2 days. Patient actually reports having difficulty with shortness of breath over the last 2-3 months with multiple falls which she related to generalized weakness. However in the last 2 days he noted that his voice was deepening and he was congested. Denies coughing during the day but at night while utilizing his CPAP mask he would seem to cough more. Unable to produce any sputum. Also noted complaints of chest tightness like someone was squeezing his chest. He endorses nasal congestion, decreased activity, and malaise. Patient notes previously having pneumonia 2 years ago in which he ended up in the ICU, but this time was not similar in that he was able to cough up mucus.  Upon admission patient is evaluated initial vital signs included temperature 102.75F, heart rate 151, blood pressure 69/49, O2 sats 89%. Chest x-ray and seen have bilateral infiltrates suggestive of pneumonia. Lab work reveals WBC 16.7, lactic acid 4.52, creatinine 2.1 BUN 21. Patient noted weight 2 weeks ago was 220 pounds however tonight was weighed at 240 pounds. ROS     Past Medical History  Diagnosis Date  . MI (myocardial infarction) ([place])   . Diabetes mellitus without complication ([place])   . Hypertension      Past Surgical History  Procedure Laterality Date  . Coronary angioplasty with stent placement      x 10  . Angioplasty        Social History:  reports that he has never smoked. He does not have any smokeless tobacco history on file. He reports that he does not drink alcohol or use illicit drugs. Where  does patient live--home  and with whom if at home? Wife Can patient participate in ADLs?Yes  Allergies  Allergen Reactions  . Atenolol   . Contrast Media [Iodinated Diagnostic Agents]   . Penicillins     No family history on file.      Prior to Admission medications   Not on File     Physical Exam: Filed Vitals:   03/18/16 1840 03/18/16 1850 03/18/16 1855 03/18/16 1905  BP: 78/62 103/81 82/59 81/59   Pulse: 80 78 80 86  Temp:      TempSrc:      Resp: 22 18 22 16   Height:      Weight:      SpO2: 94% 96% 93% 89%     Constitutional: Vital signs reviewed. Patient is obese male who appears sick, but nontoxic at this time. alert and oriented 3 at this time Head: Normocephalic and atraumatic  Ear: TM normal bilaterally  Mouth: no erythema or exudates, MMM  Eyes: PERRL, EOMI, conjunctivae normal, No scleral icterus.  Neck: Supple, Trachea midline normal ROM, No JVD, mass, thyromegaly, or carotid bruit present.  Cardiovascular:  Pulmonary/Chest: Bilateral rhonchi appreciated and tachypneic  Abdominal: Soft. Non-tender, moderately distended, bowel sounds are normal, no masses, organomegaly, or guarding present.  GU: no CVA tenderness Musculoskeletal: No joint deformities, erythema, or stiffness, ROM full and no nontender Ext: 2+ pitting edema noted on the left lower extremity, trace edema noted on the right lower extremity and no cyanosis, pulses palpable bilaterally (DP and  PT)  Hematology: no cervical, inginal, or axillary adenopathy.  Neurological: A&O x3, Strenght is normal and symmetric bilaterally, cranial nerve II-XII are grossly intact, no focal motor deficit, sensory intact to light touch bilaterally.  Skin: Warm, dry and intact. No rash, cyanosis, or clubbing.  Psychiatric: Normal mood and affect. speech and behavior is normal. Judgment and thought content normal. Cognition and memory are normal.      Data Review   Micro Results No results found for this or any  previous visit (from the past 240 hour(s)).  Radiology Reports Ct Head Wo Contrast  03/18/2016  CLINICAL DATA:  STROKE -LIKE SYMPTOMS-PLEASE EVALUATE FOR STROKE. Time course is not indicated. EXAM: CT HEAD WITHOUT CONTRAST TECHNIQUE: Contiguous axial images were obtained from the base of the skull through the vertex without intravenous contrast. COMPARISON:  None. FINDINGS: Diffuse cerebral atrophy. Ventricular dilatation consistent with central atrophy. Patchy areas of low-attenuation change in the deep white matter consistent with small vessel ischemia. No mass effect or midline shift. No abnormal extra-axial fluid collections. Gray-white matter junctions are distinct. Basal cisterns are not effaced. No evidence of acute intracranial hemorrhage. No depressed skull fractures. Mucosal thickening throughout the left maxillary antrum, left ethmoid air cells, left frontal, and left sphenoid sinuses. Minimal mucosal thickening in the right paranasal sinuses. Visualized mastoid air cells are not opacified. Vascular calcifications. IMPRESSION: No acute intracranial abnormalities. Chronic atrophy and small vessel ischemic changes. Electronically Signed   By: Lucienne Capers M.D.   On: 03/18/2016 18:19   Dg Chest Portable 1 View  03/18/2016  CLINICAL DATA:  Fever, altered mental status, history of hypertension and diabetes EXAM: PORTABLE CHEST 1 VIEW COMPARISON:  None. FINDINGS: There is extensive infiltrate over the left lung. This obscures the left heart border. Heart size is therefore difficult to establish but appears mildly large. To a lesser degree there is patchy multifocal right-sided infiltrate. No pleural effusions. IMPRESSION: Bilateral left greater than right infiltrates. Bilateral pneumonia is suspected. Radiographic follow-up recommended. Electronically Signed   By: Skipper Cliche M.D.   On: 03/18/2016 18:09     CBC  Recent Labs Lab 03/18/16 1607 03/18/16 1621  WBC 16.7*  --   HGB 10.2*  11.2*  HCT 30.8* 33.0*  PLT 192  --   MCV 91.4  --   MCH 30.3  --   MCHC 33.1  --   RDW 14.1  --   LYMPHSABS 1.2  --   MONOABS 0.7  --   EOSABS 0.0  --   BASOSABS 0.0  --     Chemistries   Recent Labs Lab 03/18/16 1607 03/18/16 1621  NA 135 134*  K 4.4 4.3  CL 102 100*  CO2 22  --   GLUCOSE 278* 266*  BUN 19 21*  CREATININE 2.13* 2.10*  CALCIUM 9.1  --   AST 37  --   ALT 19  --   ALKPHOS 42  --   BILITOT 0.7  --    ------------------------------------------------------------------------------------------------------------------ estimated creatinine clearance is 41.7 mL/min (by C-G formula based on Cr of 2.1). ------------------------------------------------------------------------------------------------------------------ No results for input(s): HGBA1C in the last 72 hours. ------------------------------------------------------------------------------------------------------------------ No results for input(s): CHOL, HDL, LDLCALC, TRIG, CHOLHDL, LDLDIRECT in the last 72 hours. ------------------------------------------------------------------------------------------------------------------ No results for input(s): TSH, T4TOTAL, T3FREE, THYROIDAB in the last 72 hours.  Invalid input(s): FREET3 ------------------------------------------------------------------------------------------------------------------ No results for input(s): VITAMINB12, FOLATE, FERRITIN, TIBC, IRON, RETICCTPCT in the last 72 hours.  Coagulation profile  Recent Labs Lab 03/18/16 1607  INR  1.29    No results for input(s): DDIMER in the last 72 hours.  Cardiac Enzymes No results for input(s): CKMB, TROPONINI, MYOGLOBIN in the last 168 hours.  Invalid input(s): CK ------------------------------------------------------------------------------------------------------------------ Invalid input(s): POCBNP   CBG:  Recent Labs Lab 03/18/16 1639  GLUCAP 232*       EKG:  Independently reviewed. Atrial fibrillation axis deviation of 97   Assessment/Plan Septic shock secondary to Multifocal pneumonia: Acute. Patient with reports of congestion and worsening shortness of breath over the last 2-3 days. On admission patient while signs included temperature 102.8F, heart rate 150s, blood pressure 62/49. Chest x-ray showing bilateral infiltrates. Lactic acid level had trended up to 4.52. - Admit to stepdown  - Continuous pulse oximetry with nasal canal oxygen to keep O2 sats greater than 92% - Empiric antibiotics with Levaquin, vancomycin, aztreonam dosing per pharmacy - Check ABG if O2 sats are not maintained - Panculture patient will need to follow-up results of cultures - Trend lactic acid levels - Duonebs every 6 hours and prn every 2 hours shortness of breath - Mucinex - Will monitor blood pressure for the possible need of pressors - CBC in a.m.  Atrial fibrillation: Acute. Patient appears rate controlled at this time denies any previous history of being told he's had a irregular heartbeat. Potassium noted to be 4.3 - Check Tsh and magnesium - Check echocardiogram in a.m.  Left leg swelling and edema : Acute. Patient notes these symptoms in the last 3 weeks or so after being on Lyrica  - Stat venous Doppler ultrasound of lower extremities   Diabetes mellitus type 2  with hyperglycemia. On admission patient's initial blood glucose elevated at 278. - Check hemoglobin A1c  - CBGs every 4 hours for now  Chronic kidney disease stage III: Creatinine elevated at 2.1 and BUN 21, per patient notes his baseline Cr is somewhere that value. - IV fluids - Renal ultrasound discontinued - BMP in a.m.  Elevated troponin: Patient's initial troponin was elevated at 0.13 likely secondary to supply demand has no EKG changes noted.  - Trend troponins - repeat EKG immediately if patient complaining of chest pain or component seen trending upwards   Coronary artery  disease status post stent: Patient notes history of at least having 16 stents previously placed in the past from 1992 -2015. - Continue to monitor  History of congestive heart failure: Review of records shows a previous echocardiogram performed in 12/2006 estimating EF at 45%. Suspect this was following a - Strict ins and outs  OSA on CPAP  - Respiratory therapist to supply CPAP at night  Heparin for DVT ppx for now may want to change to Lovenox in a.m. if kidney function improves   Code Status:   full Family Communication: bedside Disposition Plan: admit   Total time spent 55 minutes.Greater than 50% of this time was spent in counseling, explanation of diagnosis, planning of further management, and coordination of care  [facility] Pager [phone]  If 7PM-7AM, please contact night-coverage [email or web address] Password [facility] 03/18/2016, 7:46 PM

## 2016-03-18 NOTE — Progress Notes (Addendum)
Pharmacy Antibiotic Note  Julian Ross is a 70 y.o. male admitted on 03/18/2016 with pneumonia.  Pharmacy has been consulted for vancomycin, levofloxacin, and aztreonam dosing.  SCr 2.1, unsure of baseline as past lab work from outside facilities is a few years old (1.78 in 11/2013 per [facility] records). Normalized CrCl ~30-53mL/min  One time doses ordered by EDP- vancomycin 1g, levofloxacin 750mg  IV, aztreonam 2g IV x1  Plan: -vancomycin 1500mg  IV x1 to complete a 2500mg  IV load, then start 1250mg  IV q24h per obesity nomogram.  -Goal Trough 15-20mg /mL -levofloxacin 750mg  IV q48h starting 3/21 -aztreonam 2g IV q8h  Temp (24hrs), Avg:101.1 F (38.4 C), Min:99.3 F (37.4 C), Max:102.8 F (39.3 C)   Recent Labs Lab 03/18/16 1607 03/18/16 1621 03/18/16 1622  WBC 16.7*  --   --   CREATININE  --  2.10*  --   LATICACIDVEN  --   --  3.02*    CrCl cannot be calculated (Unknown ideal weight.).    Allergies  Allergen Reactions  . Atenolol   . Contrast Media [Iodinated Diagnostic Agents]   . Penicillins     Antimicrobials this admission: vancomycin 3/19 >>  aztreonam 3/19 >>  Levofloxacin 3/19>>  Dose adjustments this admission: n/a  Microbiology results: 3/19 BCx: ordered 3/19 UCx: ordered   Thank you for allowing pharmacy to be a part of this patient's care.  Jari Carollo D. Leith Hedlund, PharmD, BCPS Clinical Pharmacist Pager: [phone] 03/18/2016 4:55 PM

## 2016-03-19 ENCOUNTER — Inpatient Hospital Stay (HOSPITAL_COMMUNITY): Payer: Medicare Other

## 2016-03-19 ENCOUNTER — Inpatient Hospital Stay (HOSPITAL_COMMUNITY)
Admit: 2016-03-19 | Discharge: 2016-03-19 | Disposition: A | Discharge status: Home / Self Care | Payer: Medicare Other | Attending: Internal Medicine | Admitting: Internal Medicine

## 2016-03-19 DIAGNOSIS — M7989 Other specified soft tissue disorders: Secondary | ICD-10-CM

## 2016-03-19 DIAGNOSIS — R0600 Dyspnea, unspecified: Secondary | ICD-10-CM

## 2016-03-19 DIAGNOSIS — J9601 Acute respiratory failure with hypoxia: Secondary | ICD-10-CM

## 2016-03-19 DIAGNOSIS — A419 Sepsis, unspecified organism: Secondary | ICD-10-CM

## 2016-03-19 LAB — COMPREHENSIVE METABOLIC PANEL
ALBUMIN: 2.8 g/dL — AB (ref 3.5–5.0)
ALK PHOS: 40 U/L (ref 38–126)
ALT: 27 U/L (ref 17–63)
AST: 76 U/L — ABNORMAL HIGH (ref 15–41)
Anion gap: 12 (ref 5–15)
BILIRUBIN TOTAL: 0.7 mg/dL (ref 0.3–1.2)
BUN: 22 mg/dL — ABNORMAL HIGH (ref 6–20)
CALCIUM: 8.3 mg/dL — AB (ref 8.9–10.3)
CO2: 21 mmol/L — AB (ref 22–32)
CREATININE: 2.09 mg/dL — AB (ref 0.61–1.24)
Chloride: 103 mmol/L (ref 101–111)
GFR calc non Af Amer: 31 mL/min — ABNORMAL LOW (ref >60)
GFR, EST AFRICAN AMERICAN: 36 mL/min — AB (ref >60)
GLUCOSE: 130 mg/dL — AB (ref 65–99)
Potassium: 4.3 mmol/L (ref 3.5–5.1)
Sodium: 136 mmol/L (ref 135–145)
TOTAL PROTEIN: 6 g/dL — AB (ref 6.5–8.1)

## 2016-03-19 LAB — BASIC METABOLIC PANEL
ANION GAP: 15 (ref 5–15)
Anion gap: 15 (ref 5–15)
BUN: 23 mg/dL — AB (ref 6–20)
BUN: 22 mg/dL — ABNORMAL HIGH (ref 6–20)
CALCIUM: 8.4 mg/dL — AB (ref 8.9–10.3)
CHLORIDE: 103 mmol/L (ref 101–111)
CO2: 18 mmol/L — AB (ref 22–32)
CO2: 18 mmol/L — AB (ref 22–32)
CREATININE: 2.07 mg/dL — AB (ref 0.61–1.24)
Calcium: 8.2 mg/dL — ABNORMAL LOW (ref 8.9–10.3)
Chloride: 104 mmol/L (ref 101–111)
Creatinine, Ser: 1.8 mg/dL — ABNORMAL HIGH (ref 0.61–1.24)
GFR calc Af Amer: 36 mL/min — ABNORMAL LOW (ref >60)
GFR calc non Af Amer: 31 mL/min — ABNORMAL LOW (ref >60)
GFR calc non Af Amer: 37 mL/min — ABNORMAL LOW (ref >60)
GFR, EST AFRICAN AMERICAN: 43 mL/min — AB (ref >60)
Glucose, Bld: 135 mg/dL — ABNORMAL HIGH (ref 65–99)
Glucose, Bld: 111 mg/dL — ABNORMAL HIGH (ref 65–99)
Potassium: 4.2 mmol/L (ref 3.5–5.1)
Potassium: 4.6 mmol/L (ref 3.5–5.1)
SODIUM: 136 mmol/L (ref 135–145)
Sodium: 137 mmol/L (ref 135–145)

## 2016-03-19 LAB — CBG MONITORING, ED
GLUCOSE-CAPILLARY: 125 mg/dL — AB (ref 65–99)
Glucose-Capillary: 117 mg/dL — ABNORMAL HIGH (ref 65–99)
Glucose-Capillary: 137 mg/dL — ABNORMAL HIGH (ref 65–99)

## 2016-03-19 LAB — CBC
HCT: 30.8 % — ABNORMAL LOW (ref 39.0–52.0)
HEMOGLOBIN: 10.4 g/dL — AB (ref 13.0–17.0)
MCH: 31.5 pg (ref 26.0–34.0)
MCHC: 33.8 g/dL (ref 30.0–36.0)
MCV: 93.3 fL (ref 78.0–100.0)
Platelets: 198 10*3/uL (ref 150–400)
RBC: 3.3 MIL/uL — ABNORMAL LOW (ref 4.22–5.81)
RDW: 14.2 % (ref 11.5–15.5)
WBC: 18.3 10*3/uL — ABNORMAL HIGH (ref 4.0–10.5)

## 2016-03-19 LAB — I-STAT ARTERIAL BLOOD GAS, ED
ACID-BASE DEFICIT: 11 mmol/L — AB (ref 0.0–2.0)
Acid-base deficit: 10 mmol/L — ABNORMAL HIGH (ref 0.0–2.0)
BICARBONATE: 16.8 meq/L — AB (ref 20.0–24.0)
Bicarbonate: 16.5 mEq/L — ABNORMAL LOW (ref 20.0–24.0)
O2 SAT: 82 %
O2 Saturation: 88 %
PCO2 ART: 40.8 mmHg (ref 35.0–45.0)
PH ART: 7.208 — AB (ref 7.350–7.450)
Patient temperature: 98.6
TCO2: 18 mmol/L (ref 0–100)
TCO2: 18 mmol/L (ref 0–100)
pCO2 arterial: 41.3 mmHg (ref 35.0–45.0)
pH, Arterial: 7.224 — ABNORMAL LOW (ref 7.350–7.450)
pO2, Arterial: 56 mmHg — ABNORMAL LOW (ref 80.0–100.0)
pO2, Arterial: 66 mmHg — ABNORMAL LOW (ref 80.0–100.0)

## 2016-03-19 LAB — GRAM STAIN

## 2016-03-19 LAB — CBC WITH DIFFERENTIAL/PLATELET
BASOS PCT: 0 %
Basophils Absolute: 0 10*3/uL (ref 0.0–0.1)
Eosinophils Absolute: 0 10*3/uL (ref 0.0–0.7)
Eosinophils Relative: 0 %
HEMATOCRIT: 30.3 % — AB (ref 39.0–52.0)
HEMOGLOBIN: 10.1 g/dL — AB (ref 13.0–17.0)
LYMPHS PCT: 8 %
Lymphs Abs: 1.3 10*3/uL (ref 0.7–4.0)
MCH: 30.9 pg (ref 26.0–34.0)
MCHC: 33.3 g/dL (ref 30.0–36.0)
MCV: 92.7 fL (ref 78.0–100.0)
MONOS PCT: 6 %
Monocytes Absolute: 1 10*3/uL (ref 0.1–1.0)
NEUTROS ABS: 14.5 10*3/uL — AB (ref 1.7–7.7)
NEUTROS PCT: 86 %
Platelets: 195 10*3/uL (ref 150–400)
RBC: 3.27 MIL/uL — ABNORMAL LOW (ref 4.22–5.81)
RDW: 14.3 % (ref 11.5–15.5)
WBC: 16.8 10*3/uL — ABNORMAL HIGH (ref 4.0–10.5)

## 2016-03-19 LAB — D-DIMER, QUANTITATIVE: D-Dimer, Quant: 0.81 ug/mL-FEU — ABNORMAL HIGH (ref 0.00–0.50)

## 2016-03-19 LAB — MAGNESIUM: MAGNESIUM: 1.8 mg/dL (ref 1.7–2.4)

## 2016-03-19 LAB — MRSA PCR SCREENING: MRSA BY PCR: POSITIVE — AB

## 2016-03-19 LAB — LACTIC ACID, PLASMA
LACTIC ACID, VENOUS: 3.9 mmol/L — AB (ref 0.5–2.0)
Lactic Acid, Venous: 1.4 mmol/L (ref 0.5–2.0)

## 2016-03-19 LAB — POCT I-STAT 3, ART BLOOD GAS (G3+)
ACID-BASE DEFICIT: 6 mmol/L — AB (ref 0.0–2.0)
BICARBONATE: 20.2 meq/L (ref 20.0–24.0)
O2 Saturation: 100 %
PCO2 ART: 41.7 mmHg (ref 35.0–45.0)
PH ART: 7.294 — AB (ref 7.350–7.450)
PO2 ART: 243 mmHg — AB (ref 80.0–100.0)
Patient temperature: 98.5
TCO2: 21 mmol/L (ref 0–100)

## 2016-03-19 LAB — ECHOCARDIOGRAM COMPLETE
HEIGHTINCHES: 71 in
Weight: 3840 oz

## 2016-03-19 LAB — BRAIN NATRIURETIC PEPTIDE: B Natriuretic Peptide: 385.9 pg/mL — ABNORMAL HIGH (ref 0.0–100.0)

## 2016-03-19 LAB — TROPONIN I
Troponin I: 0.11 ng/mL — ABNORMAL HIGH (ref <0.031)
Troponin I: 0.1 ng/mL — ABNORMAL HIGH (ref <0.031)

## 2016-03-19 LAB — PHOSPHORUS: Phosphorus: 3.3 mg/dL (ref 2.5–4.6)

## 2016-03-19 LAB — GLUCOSE, CAPILLARY
GLUCOSE-CAPILLARY: 99 mg/dL (ref 65–99)
Glucose-Capillary: 112 mg/dL — ABNORMAL HIGH (ref 65–99)

## 2016-03-19 MED ORDER — PHENYLEPHRINE 40 MCG/ML (10ML) SYRINGE FOR IV PUSH (FOR BLOOD PRESSURE SUPPORT)
100.0000 ug | PREFILLED_SYRINGE | Freq: Once | INTRAVENOUS | Status: AC
  Administered 2016-03-19: 100 ug via INTRAVENOUS

## 2016-03-19 MED ORDER — SODIUM CHLORIDE 0.9 % IV SOLN
25.0000 ug/h | INTRAVENOUS | Status: DC
  Administered 2016-03-19: 50 ug/h via INTRAVENOUS
  Administered 2016-03-20: 100 ug/h via INTRAVENOUS
  Administered 2016-03-21: 50 ug/h via INTRAVENOUS
  Filled 2016-03-19 (×4): qty 50

## 2016-03-19 MED ORDER — CLOPIDOGREL BISULFATE 75 MG PO TABS
75.0000 mg | ORAL_TABLET | Freq: Every day | ORAL | Status: DC
  Administered 2016-03-19 – 2016-04-02 (×15): 75 mg
  Filled 2016-03-19 (×24): qty 1

## 2016-03-19 MED ORDER — CHLORHEXIDINE GLUCONATE 0.12% ORAL RINSE (MEDLINE KIT)
15.0000 mL | Freq: Two times a day (BID) | OROMUCOSAL | Status: DC
  Administered 2016-03-19 – 2016-03-20 (×2): 15 mL via OROMUCOSAL

## 2016-03-19 MED ORDER — FAMOTIDINE 40 MG/5ML PO SUSR
20.0000 mg | Freq: Every day | ORAL | Status: DC
  Administered 2016-03-19 – 2016-03-28 (×10): 20 mg
  Filled 2016-03-19: qty 3
  Filled 2016-03-19: qty 2.5
  Filled 2016-03-19: qty 3
  Filled 2016-03-19: qty 2.5
  Filled 2016-03-19: qty 3
  Filled 2016-03-19: qty 2.5
  Filled 2016-03-19 (×2): qty 3
  Filled 2016-03-19: qty 2.5
  Filled 2016-03-19: qty 3
  Filled 2016-03-19 (×2): qty 2.5
  Filled 2016-03-19: qty 3
  Filled 2016-03-19 (×3): qty 2.5
  Filled 2016-03-19: qty 3
  Filled 2016-03-19: qty 2.5
  Filled 2016-03-19 (×2): qty 3
  Filled 2016-03-19 (×4): qty 2.5

## 2016-03-19 MED ORDER — FUROSEMIDE 10 MG/ML IJ SOLN
20.0000 mg | INTRAMUSCULAR | Status: AC
  Administered 2016-03-19: 20 mg via INTRAVENOUS
  Filled 2016-03-19: qty 2

## 2016-03-19 MED ORDER — ALPRAZOLAM 0.25 MG PO TABS
0.5000 mg | ORAL_TABLET | ORAL | Status: AC
  Administered 2016-03-19: 0.5 mg via ORAL
  Filled 2016-03-19: qty 2

## 2016-03-19 MED ORDER — ANTISEPTIC ORAL RINSE SOLUTION (CORINZ)
7.0000 mL | Freq: Four times a day (QID) | OROMUCOSAL | Status: DC
Start: 2016-03-19 — End: 2016-03-20
  Administered 2016-03-19 – 2016-03-20 (×3): 7 mL via OROMUCOSAL

## 2016-03-19 MED ORDER — MIDAZOLAM HCL 2 MG/2ML IJ SOLN: 1.0000 mg | INTRAMUSCULAR | Status: DC | PRN

## 2016-03-19 MED ORDER — ENOXAPARIN SODIUM 120 MG/0.8ML ~~LOC~~ SOLN: 110.0000 mg | Freq: Two times a day (BID) | SUBCUTANEOUS | Status: DC

## 2016-03-19 MED ORDER — ALPRAZOLAM 0.25 MG PO TABS: 0.5000 mg | ORAL_TABLET | Freq: Three times a day (TID) | ORAL | Status: DC | PRN

## 2016-03-19 MED ORDER — ACETAMINOPHEN 325 MG PO TABS
650.0000 mg | ORAL_TABLET | Freq: Four times a day (QID) | ORAL | Status: DC | PRN
  Administered 2016-03-19: 650 mg via ORAL
  Filled 2016-03-19: qty 2

## 2016-03-19 MED ORDER — FENTANYL BOLUS VIA INFUSION
25.0000 ug | INTRAVENOUS | Status: DC | PRN
  Filled 2016-03-19: qty 25

## 2016-03-19 MED ORDER — MIDAZOLAM HCL 2 MG/2ML IJ SOLN
1.0000 mg | INTRAMUSCULAR | Status: AC | PRN
  Administered 2016-03-19 (×3): 1 mg via INTRAVENOUS
  Filled 2016-03-19 (×2): qty 2

## 2016-03-19 MED ORDER — HYDROMORPHONE HCL 1 MG/ML IJ SOLN: 1.0000 mg | Freq: Once | INTRAMUSCULAR | Status: DC

## 2016-03-19 MED ORDER — GUAIFENESIN 100 MG/5ML PO SOLN
15.0000 mL | Freq: Four times a day (QID) | ORAL | Status: DC
  Administered 2016-03-19 – 2016-03-25 (×20): 300 mg
  Filled 2016-03-19 (×26): qty 15

## 2016-03-19 MED ORDER — HYDROCORTISONE NA SUCCINATE PF 100 MG IJ SOLR
50.0000 mg | Freq: Four times a day (QID) | INTRAMUSCULAR | Status: DC
  Administered 2016-03-19 – 2016-03-20 (×4): 50 mg via INTRAVENOUS
  Filled 2016-03-19 (×2): qty 1
  Filled 2016-03-19: qty 2
  Filled 2016-03-19 (×2): qty 1

## 2016-03-19 MED ORDER — ENOXAPARIN SODIUM 120 MG/0.8ML ~~LOC~~ SOLN
110.0000 mg | Freq: Once | SUBCUTANEOUS | Status: AC
  Administered 2016-03-19: 110 mg via SUBCUTANEOUS
  Filled 2016-03-19: qty 0.8

## 2016-03-19 MED ORDER — CHLORHEXIDINE GLUCONATE CLOTH 2 % EX PADS
6.0000 | MEDICATED_PAD | Freq: Every day | CUTANEOUS | Status: AC
  Administered 2016-03-20 – 2016-03-24 (×5): 6 via TOPICAL

## 2016-03-19 MED ORDER — CLOPIDOGREL BISULFATE 75 MG PO TABS
75.0000 mg | ORAL_TABLET | Freq: Every day | ORAL | Status: DC
  Filled 2016-03-19: qty 1

## 2016-03-19 MED ORDER — MUPIROCIN 2 % EX OINT
1.0000 "application " | TOPICAL_OINTMENT | Freq: Two times a day (BID) | CUTANEOUS | Status: AC
  Administered 2016-03-19 – 2016-03-24 (×10): 1 via NASAL
  Filled 2016-03-19: qty 22

## 2016-03-19 MED ORDER — FUROSEMIDE 10 MG/ML IJ SOLN
20.0000 mg | Freq: Once | INTRAMUSCULAR | Status: AC
  Administered 2016-03-19: 20 mg via INTRAVENOUS
  Filled 2016-03-19: qty 2

## 2016-03-19 MED ORDER — ETOMIDATE 2 MG/ML IV SOLN: 20.0000 mg | Freq: Once | INTRAVENOUS | Status: DC

## 2016-03-19 MED ORDER — ETOMIDATE 2 MG/ML IV SOLN
20.0000 mg | Freq: Once | INTRAVENOUS | Status: AC
  Administered 2016-03-19: 20 mg via INTRAVENOUS

## 2016-03-19 MED ORDER — SODIUM CHLORIDE 0.9 % IV SOLN
0.0000 mg/h | INTRAVENOUS | Status: DC
  Administered 2016-03-19 – 2016-03-20 (×2): 2 mg/h via INTRAVENOUS
  Filled 2016-03-19 (×2): qty 10

## 2016-03-19 MED ORDER — SUCCINYLCHOLINE CHLORIDE 20 MG/ML IJ SOLN
100.0000 mg | Freq: Once | INTRAMUSCULAR | Status: AC
  Administered 2016-03-19: 100 mg via INTRAVENOUS

## 2016-03-19 MED ORDER — LORAZEPAM 2 MG/ML IJ SOLN
2.0000 mg | Freq: Once | INTRAMUSCULAR | Status: AC
  Administered 2016-03-19: 2 mg via INTRAVENOUS
  Filled 2016-03-19: qty 1

## 2016-03-19 MED ORDER — BISACODYL 10 MG RE SUPP: 10.0000 mg | Freq: Every day | RECTAL | Status: DC | PRN

## 2016-03-19 MED ORDER — VITAL HIGH PROTEIN PO LIQD
1000.0000 mL | ORAL | Status: DC
  Administered 2016-03-19: 1000 mL

## 2016-03-19 MED ORDER — LORAZEPAM 2 MG/ML IJ SOLN
1.0000 mg | Freq: Once | INTRAMUSCULAR | Status: AC
  Administered 2016-03-19: 1 mg via INTRAVENOUS
  Filled 2016-03-19: qty 1

## 2016-03-19 MED ORDER — SODIUM CHLORIDE 0.9 % IV SOLN
1.0000 mg/h | INTRAVENOUS | Status: DC
  Administered 2016-03-19: 2 mg/h via INTRAVENOUS
  Filled 2016-03-19: qty 10

## 2016-03-19 MED ORDER — FENTANYL CITRATE (PF) 100 MCG/2ML IJ SOLN
50.0000 ug | Freq: Once | INTRAMUSCULAR | Status: AC
  Administered 2016-03-19: 50 ug via INTRAVENOUS
  Filled 2016-03-19: qty 2

## 2016-03-19 MED ORDER — SODIUM CHLORIDE 0.9 % IV SOLN
250.0000 mL | INTRAVENOUS | Status: DC | PRN
  Administered 2016-03-19: 250 mL via INTRAVENOUS

## 2016-03-19 NOTE — Progress Notes (Signed)
RT called to assist with pt having problems with desats on home cpap machine.  Pt was on a NRB at 8lpm and sats were in low 80s.  RT increased flow to nrb to 15lpm and sats returned to nearly 100%.  RT changed o2 device to [place] at 6lpm and sats dropped to nlow 80s.  Rt replaced [place] with venti at 14lpm and 55% o2.  Rt will monitor.

## 2016-03-19 NOTE — Progress Notes (Addendum)
*  PRELIMINARY RESULTS* Vascular Ultrasound Lower extremity venous duplex has been completed.  Preliminary findings: No evidence of DVT.  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   Landry Mellow, RDMS, RVT  03/19/2016, 11:25 AM

## 2016-03-19 NOTE — ED Notes (Signed)
cbg 125 per Lavella Lemons

## 2016-03-19 NOTE — Progress Notes (Signed)
eLink Physician-Brief Progress Note Patient Name: Tyquan Eilers DOB: [birth year] MRN: [id number]   Date of Service  03/19/2016  HPI/Events of Note  Agitation - Patient is already on a Fentanyl IV infusion.   eICU Interventions  Will order a Versed IV infusion. Titrate to RASS = 0 to -1.      Intervention Category Minor Interventions: Agitation / anxiety - evaluation and management  Sommer,Steven Eugene 03/19/2016, 3:28 PM

## 2016-03-19 NOTE — ED Notes (Signed)
Pt resting comfortably at this time. VSS.

## 2016-03-19 NOTE — ED Notes (Addendum)
Called vascular RE ECHO and Vas [place] LE they stated tests for later on today, possibly.

## 2016-03-19 NOTE — ED Notes (Signed)
Pt called out for help, this RN went in to the room, pt clearly in distress, states he is difficulty breathing. Pt was using his at home cpap that family brought. Pt O2 level in the 70s, with a good pleth on the monitor. Encouraged pt to try pursed lip breathing. Dr. Tamala Julian notified and at the bedside, awaiting new orders.

## 2016-03-19 NOTE — ED Notes (Signed)
O2 sats continue to drop.  Able to get back up briefly.  Lasix given per order.  Admitting physician paged and made aware.

## 2016-03-19 NOTE — ED Notes (Signed)
ECHO at bedside.Marland Kitchen  CCM NP at bedside.

## 2016-03-19 NOTE — Progress Notes (Signed)
PULMONARY / CRITICAL CARE MEDICINE   Name: Julian Ross MRN: [id number] DOB: October 20, 1946    ADMISSION DATE:  03/18/2016 CONSULTATION DATE:  03/19/16  REFERRING MD:  Fuller Plan  CHIEF COMPLAINT:  Shortness of breath, confusion  SUBJECTIVE:  Wife reports patient is generally non-compliant with prescribed regimen of medications.  He doesn't take DM meds as prescribed and uses xanax before expected time frame.  No    VITAL SIGNS: BP 107/66 mmHg  Pulse 96  Temp(Src) 101.7 F (38.7 C) (Rectal)  Resp 16  Ht 5\' 11"  (1.803 m)  Wt 240 lb (108.863 kg)  BMI 33.49 kg/m2  SpO2 100%  HEMODYNAMICS:    VENTILATOR SETTINGS: Vent Mode:  [-] PRVC FiO2 (%):  [100 %] 100 % Set Rate:  [16 bmp-18 bmp] 16 bmp Vt Set:  [660 mL] 660 mL PEEP:  [14 cmH20-18 cmH20] 18 cmH20 Plateau Pressure:  [33 cmH20-34 cmH20] 33 cmH20  INTAKE / OUTPUT: I/O last 3 completed shifts: In: 4000 [I.V.:4000] Out: 1100 [Urine:1100]  PHYSICAL EXAMINATION: General: wdwn male in NAD on vent Neuro: sedate on vent, spontaneously moves all extremities CV: s1s2 irregularly irregular, AF on monitor / rate 100's, no m/r/g PULM: ETT, even/non-labored on vent, lungs bilaterally coarse GI: obese/soft, bsx4 active  Extremities: no acute deformities, BLE 1-2+ pitting edema   LABS:  BMET  Recent Labs Lab 03/18/16 1607 03/18/16 1621 03/19/16 0338 03/19/16 0622  NA 135 134* 136 136  K 4.4 4.3 4.3 4.2  CL 102 100* 103 103  CO2 22  --  21* 18*  BUN 19 21* 22* 23*  CREATININE 2.13* 2.10* 2.09* 2.07*  GLUCOSE 278* 266* 130* 135*    Electrolytes  Recent Labs Lab 03/18/16 1607 03/18/16 2034 03/19/16 0338 03/19/16 0622  CALCIUM 9.1  --  8.3* 8.2*  MG  --  2.0  --  1.8  PHOS  --   --   --  3.3    CBC  Recent Labs Lab 03/18/16 1607 03/18/16 1621 03/19/16 0338 03/19/16 0622  WBC 16.7*  --  16.8* 18.3*  HGB 10.2* 11.2* 10.1* 10.4*  HCT 30.8* 33.0* 30.3* 30.8*  PLT 192  --  195 198     Coag's  Recent Labs Lab 03/18/16 1607  APTT 33  INR 1.29    Sepsis Markers  Recent Labs Lab 03/18/16 1711 03/18/16 2034 03/18/16 2050 03/19/16 0011  LATICACIDVEN 4.52*  --  2.6*  2.60* 3.9*  PROCALCITON  --  1.23  --   --     ABG  Recent Labs Lab 03/19/16 0506 03/19/16 0618  PHART 7.208* 7.224*  PCO2ART 41.3 40.8  PO2ART 56.0* 66.0*    Liver Enzymes  Recent Labs Lab 03/18/16 1607 03/19/16 0338  AST 37 76*  ALT 19 27  ALKPHOS 42 40  BILITOT 0.7 0.7  ALBUMIN 2.8* 2.8*    Cardiac Enzymes  Recent Labs Lab 03/18/16 2034 03/19/16 0010 03/19/16 0217  TROPONINI 0.13* 0.11* 0.10*    Glucose  Recent Labs Lab 03/18/16 1639 03/18/16 2138 03/18/16 2333 03/19/16 0402 03/19/16 0911 03/19/16 1212  GLUCAP 232* 235* 237* 117* 137* 125*    Imaging Ct Head Wo Contrast  03/18/2016  CLINICAL DATA:  STROKE -LIKE SYMPTOMS-PLEASE EVALUATE FOR STROKE. Time course is not indicated. EXAM: CT HEAD WITHOUT CONTRAST TECHNIQUE: Contiguous axial images were obtained from the base of the skull through the vertex without intravenous contrast. COMPARISON:  None. FINDINGS: Diffuse cerebral atrophy. Ventricular dilatation consistent with central atrophy. Patchy areas  of low-attenuation change in the deep white matter consistent with small vessel ischemia. No mass effect or midline shift. No abnormal extra-axial fluid collections. Gray-white matter junctions are distinct. Basal cisterns are not effaced. No evidence of acute intracranial hemorrhage. No depressed skull fractures. Mucosal thickening throughout the left maxillary antrum, left ethmoid air cells, left frontal, and left sphenoid sinuses. Minimal mucosal thickening in the right paranasal sinuses. Visualized mastoid air cells are not opacified. Vascular calcifications. IMPRESSION: No acute intracranial abnormalities. Chronic atrophy and small vessel ischemic changes. Electronically Signed   By: Lucienne Capers M.D.    On: 03/18/2016 18:19   Dg Chest Port 1 View  03/19/2016  CLINICAL DATA:  Respiratory failure EXAM: PORTABLE CHEST 1 VIEW COMPARISON:  03/19/2016 FINDINGS: The endotracheal tube is 5.5 cm above the carina. Multifocal consolidation persists bilaterally, probably not significantly changed. No pneumothorax. IMPRESSION: Satisfactorily positioned ETT. No significant interval change in the bilateral airspace opacities. Electronically Signed   By: Andreas Newport M.D.   On: 03/19/2016 06:28   Dg Chest Port 1 View  03/19/2016  CLINICAL DATA:  Dyspnea for 2 months, worsened over the past 2 days. EXAM: PORTABLE CHEST 1 VIEW COMPARISON:  03/18/2016 FINDINGS: Multifocal consolidation has mildly worsened from 03/18/2016, now with more confluent opacity in the central right lung and in the left upper lobe periphery. There may be a left pleural IMPRESSION: Worsening consolidation Electronically Signed   By: Andreas Newport M.D.   On: 03/19/2016 01:19   Dg Chest Portable 1 View  03/18/2016  CLINICAL DATA:  Fever, altered mental status, history of hypertension and diabetes EXAM: PORTABLE CHEST 1 VIEW COMPARISON:  None. FINDINGS: There is extensive infiltrate over the left lung. This obscures the left heart border. Heart size is therefore difficult to establish but appears mildly large. To a lesser degree there is patchy multifocal right-sided infiltrate. No pleural effusions. IMPRESSION: Bilateral left greater than right infiltrates. Bilateral pneumonia is suspected. Radiographic follow-up recommended. Electronically Signed   By: Skipper Cliche M.D.   On: 03/18/2016 18:09     STUDIES:  CT Head 3/19 >> no acute process, chronic atrophy & small vessl disease ECHO 3/20 >>   CULTURES: Blood cx 3/19 >> RVP 3/19 >>  Flu 3/19 >> neg   ANTIBIOTICS: Vanc 3/19 >> Aztreonam 3/19 >> Levaquin 3/19 >>  SIGNIFICANT EVENTS: 3/19  Admit with suspected PNA, hypoxic respiratory failure  LINES/TUBES: ETT 3/20 Foley  3/20  DISCUSSION: Mr. Waxler is a 42M with acute hypoxemic respiratory failure requiring mechanical ventilation. He has a significant cardiac history with prior MI and prior stenting, RA on methotrexate/pred. He is volume up but likely also has a dense pneumonia. Initial soft BP responded to IVF.    ASSESSMENT / PLAN:  PULMONARY A: Acute hypoxemic respiratory failure - in setting of PNA +/- edema Pneumonia - immunocompromised with RA Hx ARDS - 10 days in ICU after cath with vessel injury  OSA - on CPAP, ? compliance Never Smoker P:   PRVC, 8cc/kg, low Vt strategy Wean PEEP / FiO2 for sats > 92%  See ID Diurese as BP tolerates Intermittent CXR Will need CPAP QHS post extubation  Follow up ABG now, review PA/FiO2 ratio  CARDIOVASCULAR A:  New onset A fib - rate controlled at present, suspect secondary to acute infection  Prior MI Hx CAD s/p PCI - reportedly has 16 stents per wife Mild troponin leak Volume overload Baseline Prednisone Dependent - 5mg  QD Hx ICM  P:  ICU  monitoring of hemodynamics Trend troponin Assess ECHO  Diurese as BP allows Add stress dose steroids with acute illness, soft BP's  Continue plavix  Hold home cozaar, lasix, norvasc  RENAL A:   Cr 2.13; unknown baseline, last seen in care everywhere ~1.5,  likely AKI in setting of critical illness CKD III AG / Lactic acidosis P:   Monitor Creatinine, UOP Trend BMP  Avoid nephrotoxins Trend lactate Replace electrolytes as indicated   GASTROINTESTINAL A:   At Risk Protein Calorie Malnutrition GERD   P:   NPO for now Stress ulcer ppx Begin TF   HEMATOLOGIC A:   Anemia - suspect element of chronic disease P:  Monitor CBC Lovenox for DVT prophylaxis   INFECTIOUS A:   Severe sepsis secondary to pneumonia P:   Broad spectrum abx (vanc, aztreonam, levaquin) Monitor BP Trend lactate  AUTOIMMUNE:  A:  RA -  on plaquenil / prednisone 5mg  QD P:  Hold home plaquenil, prednisone  5mg  Stress dose steroids as above  ENDOCRINE A:   DMII P:   Accuchecks and SSI  NEUROLOGIC A:   Altered mental status Anxiety - uses xanax at home, wife reports will use all Rx in one week then none for 3 weeks Diabetic Neuropathy  Essential Tremor  Partial Blindness  Hx CVA  P:   RASS goal: -1 to -2 for vent synchrony Fentanyl gtt for pain  PRN versed for sedation  Hold home paxil Monitor for benzo withdrawal   FAMILY  - Updates:  Wife updated extensively at bedside.  Pt has two sons - one neurosurgeon and a dentist.    - Inter-disciplinary family meet or Palliative Care meeting due by:  3/27   Noe Gens, NP-C [place] Pgr: [phone] or if no answer [phone] 03/19/2016, 1:51 PM

## 2016-03-19 NOTE — ED Notes (Addendum)
Intubation complete at this time.  8.0 tube 24 at the lip.  Good color change.

## 2016-03-19 NOTE — Progress Notes (Signed)
ANTICOAGULATION CONSULT NOTE - Initial Consult  Pharmacy Consult for Lovenox Indication: R/O DVT  Allergies  Allergen Reactions  . Atenolol   . Contrast Media [Iodinated Diagnostic Agents]   . Penicillins     Patient Measurements: Height: 5\' 11"  (180.3 cm) Weight: 240 lb (108.863 kg) IBW/kg (Calculated) : 75.3  Vital Signs: Temp: 102.8 F (39.3 C) (03/19 1715) Temp Source: Rectal (03/19 1643) BP: 105/71 mmHg (03/20 0000) Pulse Rate: 82 (03/20 0000)  Labs:  Recent Labs  03/18/16 1607 03/18/16 1621 03/18/16 2034  HGB 10.2* 11.2*  --   HCT 30.8* 33.0*  --   PLT 192  --   --   APTT 33  --   --   LABPROT 16.2*  --   --   INR 1.29  --   --   CREATININE 2.13* 2.10*  --   TROPONINI  --   --  0.13*    Estimated Creatinine Clearance: 41.7 mL/min (by C-G formula based on Cr of 2.1).   Medical History: Past Medical History  Diagnosis Date  . MI (myocardial infarction) ([place])   . Diabetes mellitus without complication ([place])   . Hypertension     Medications:  Medication History pending  Assessment: 70 y.o. male with SOB, LE swelling, possible DVT, for Lovenox  Goal of Therapy:  Full anticoagulation with Lovenox Monitor platelets by anticoagulation protocol: Yes   Plan:  Lovenox 110 mg SQ q12h  Caryl Pina 03/19/2016,12:16 AM

## 2016-03-19 NOTE — ED Notes (Signed)
Called RT - unavailable to transport pt.

## 2016-03-19 NOTE — Progress Notes (Signed)
Rt called to assess pt with WOB and anxiety and sats in 80s on venti at 14lpm and 55% at 445.  Pt very anxious and sats dropping as pt was talking.  RN attempted breathing tx before RT arrived, but RT removed tx to place pt on NRB.  ED doctor stopped to see pt and noticed the WOB and explained that we would try the bipap.  Pt was placed on bipap at 100% and 20/5 with no changes.  Pt was transported to trauma room C and was intubated by ED physician.  RT monitoring at this time and ABG is ordered and will be drawn.

## 2016-03-19 NOTE — ED Provider Notes (Signed)
CSN: [id number]     Arrival date & time 03/18/16  1551 History   First MD Initiated Contact with Patient 03/18/16 1624     Chief Complaint  Patient presents with  . Weakness  . Altered Mental Status     (Consider location/radiation/quality/duration/timing/severity/associated sxs/prior Treatment) HPI Comments: I was asked to reassess the patient at 5:00 am. Briefly, 70 y/o with CAD comes in with worsening dyspnea and is noted to be in septic shock due to multifocal CAP. Pt was admitted by the hospitalist team. Overnight, his resp distress got worse, when he was using his home CPAP machine.  I was asked to assess the patient and he is in acute respiratory failure-  On a NRB. Pt is in resp distress as well. He is alert. HR in the 140s. Pt's exam reveals crackles at the base.  We placed him on bipap. ABG ordered. Few minutes later, pt still in resp distress, SO2 in the upper 80s and low 90s. We perceived a likely rapid decompensation that could leaf to respiratory arrest and decided to intubate electively.  Pt tolerate the procedure well.  CCM taking over the management.  Patient is a 70 y.o. male presenting with weakness and altered mental status. The history is provided by the patient and the nursing home.  Weakness  Altered Mental Status Associated symptoms: weakness     Past Medical History  Diagnosis Date  . MI (myocardial infarction) ([place])   . Diabetes mellitus without complication ([place])   . Hypertension    Past Surgical History  Procedure Laterality Date  . Coronary angioplasty with stent placement      x 10  . Angioplasty     No family history on file. Social History  Substance Use Topics  . Smoking status: Never Smoker   . Smokeless tobacco: None  . Alcohol Use: No    Review of Systems  Unable to perform ROS: Severe respiratory distress  Neurological: Positive for weakness.      Allergies  Atenolol; Contrast media; and Penicillins  Home Medications    Prior to Admission medications   Not on File   BP 165/99 mmHg  Pulse 144  Temp(Src) 102.8 F (39.3 C) (Rectal)  Resp 33  Ht 5\' 11"  (1.803 m)  Wt 240 lb (108.863 kg)  BMI 33.49 kg/m2  SpO2 83% Physical Exam  Constitutional: He is oriented to person, place, and time. He appears well-developed. He appears distressed.  HENT:  Head: Atraumatic.  Eyes: Conjunctivae are normal.  Neck: Neck supple.  Cardiovascular:  Irregular and tachycardic  Pulmonary/Chest: He is in respiratory distress. He has rales.  Neurological: He is alert and oriented to person, place, and time.  Skin: Skin is warm.  Nursing note and vitals reviewed.   ED Course  .Critical Care Performed by: Varney Biles Authorized by: Varney Biles Total critical care time: 35 minutes Critical care time was exclusive of separately billable procedures and treating other patients. Critical care was necessary to treat or prevent imminent or life-threatening deterioration of the following conditions: respiratory failure, shock and sepsis. Critical care was time spent personally by me on the following activities: development of treatment plan with patient or surrogate, discussions with consultants, evaluation of patient's response to treatment, examination of patient, obtaining history from patient or surrogate, ordering and performing treatments and interventions, ordering and review of radiographic studies, ordering and review of laboratory studies, pulse oximetry, re-evaluation of patient's condition, review of old charts and ventilator management. Subsequent provider of  critical care: I assumed direction of critical care for this patient from another provider of my specialty.  .Intubation Date/Time: 03/19/2016 5:54 AM Performed by: Varney Biles Authorized by: Varney Biles Consent: The procedure was performed in an emergent situation. Verbal consent obtained. Risks and benefits: risks, benefits and alternatives  were discussed Consent given by: patient Imaging studies: imaging studies available Required items: required blood products, implants, devices, and special equipment available Patient identity confirmed: arm band Time out: Immediately prior to procedure a "time out" was called to verify the correct patient, procedure, equipment, support staff and site/side marked as required. Indications: respiratory distress and  respiratory failure Intubation method: video-assisted Patient status: paralyzed (RSI) Preoxygenation: BVM Sedatives: etomidate Paralytic: succinylcholine Laryngoscope size: Mac 4 Tube size: 8.0 mm Tube type: cuffed Number of attempts: 2 Cricoid pressure: yes Cords visualized: yes Post-procedure assessment: chest rise,  ETCO2 monitor and CO2 detector Breath sounds: equal Cuff inflated: yes ETT to lip: 24 cm Tube secured with: ETT holder Chest x-ray interpreted by me. Chest x-ray findings: endotracheal tube in appropriate position Patient tolerance: Patient tolerated the procedure well with no immediate complications   (including critical care time) Labs Review Labs Reviewed  PROTIME-INR - Abnormal; Notable for the following:    Prothrombin Time 16.2 (*)    All other components within normal limits  CBC - Abnormal; Notable for the following:    WBC 16.7 (*)    RBC 3.37 (*)    Hemoglobin 10.2 (*)    HCT 30.8 (*)    All other components within normal limits  DIFFERENTIAL - Abnormal; Notable for the following:    Neutro Abs 14.8 (*)    All other components within normal limits  COMPREHENSIVE METABOLIC PANEL - Abnormal; Notable for the following:    Glucose, Bld 278 (*)    Creatinine, Ser 2.13 (*)    Total Protein 6.0 (*)    Albumin 2.8 (*)    GFR calc non Af Amer 30 (*)    GFR calc Af Amer 35 (*)    All other components within normal limits  LACTIC ACID, PLASMA - Abnormal; Notable for the following:    Lactic Acid, Venous 2.6 (*)    All other components within  normal limits  LACTIC ACID, PLASMA - Abnormal; Notable for the following:    Lactic Acid, Venous 3.9 (*)    All other components within normal limits  CBC WITH DIFFERENTIAL/PLATELET - Abnormal; Notable for the following:    WBC 16.8 (*)    RBC 3.27 (*)    Hemoglobin 10.1 (*)    HCT 30.3 (*)    Neutro Abs 14.5 (*)    All other components within normal limits  COMPREHENSIVE METABOLIC PANEL - Abnormal; Notable for the following:    CO2 21 (*)    Glucose, Bld 130 (*)    BUN 22 (*)    Creatinine, Ser 2.09 (*)    Calcium 8.3 (*)    Total Protein 6.0 (*)    Albumin 2.8 (*)    AST 76 (*)    GFR calc non Af Amer 31 (*)    GFR calc Af Amer 36 (*)    All other components within normal limits  TROPONIN I - Abnormal; Notable for the following:    Troponin I 0.13 (*)    All other components within normal limits  TROPONIN I - Abnormal; Notable for the following:    Troponin I 0.11 (*)    All other components within  normal limits  TROPONIN I - Abnormal; Notable for the following:    Troponin I 0.10 (*)    All other components within normal limits  BRAIN NATRIURETIC PEPTIDE - Abnormal; Notable for the following:    B Natriuretic Peptide 385.9 (*)    All other components within normal limits  D-DIMER, QUANTITATIVE (NOT AT [facility]) - Abnormal; Notable for the following:    D-Dimer, Quant 0.81 (*)    All other components within normal limits  I-STAT TROPOININ, ED - Abnormal; Notable for the following:    Troponin i, poc 0.13 (*)    All other components within normal limits  CBG MONITORING, ED - Abnormal; Notable for the following:    Glucose-Capillary 232 (*)    All other components within normal limits  I-STAT CHEM 8, ED - Abnormal; Notable for the following:    Sodium 134 (*)    Chloride 100 (*)    BUN 21 (*)    Creatinine, Ser 2.10 (*)    Glucose, Bld 266 (*)    Hemoglobin 11.2 (*)    HCT 33.0 (*)    All other components within normal limits  I-STAT CG4 LACTIC ACID, ED - Abnormal;  Notable for the following:    Lactic Acid, Venous 3.02 (*)    All other components within normal limits  I-STAT CG4 LACTIC ACID, ED - Abnormal; Notable for the following:    Lactic Acid, Venous 4.52 (*)    All other components within normal limits  I-STAT CG4 LACTIC ACID, ED - Abnormal; Notable for the following:    Lactic Acid, Venous 2.60 (*)    All other components within normal limits  CBG MONITORING, ED - Abnormal; Notable for the following:    Glucose-Capillary 235 (*)    All other components within normal limits  CBG MONITORING, ED - Abnormal; Notable for the following:    Glucose-Capillary 237 (*)    All other components within normal limits  I-STAT ARTERIAL BLOOD GAS, ED - Abnormal; Notable for the following:    pH, Arterial 7.208 (*)    pO2, Arterial 56.0 (*)    Bicarbonate 16.5 (*)    Acid-base deficit 11.0 (*)    All other components within normal limits  CULTURE, BLOOD (ROUTINE X 2)  CULTURE, BLOOD (ROUTINE X 2)  CULTURE, EXPECTORATED SPUTUM-ASSESSMENT  GRAM STAIN  RESPIRATORY VIRUS PANEL  APTT  INFLUENZA PANEL BY PCR (TYPE A & B, H1N1)  PROCALCITONIN  MAGNESIUM  TSH  LEGIONELLA PNEUMOPHILA TOTAL AB  HEMOGLOBIN A1C  BLOOD GAS, ARTERIAL  BLOOD GAS, ARTERIAL  I-STAT CG4 LACTIC ACID, ED    Imaging Review Ct Head Wo Contrast  03/18/2016  CLINICAL DATA:  STROKE -LIKE SYMPTOMS-PLEASE EVALUATE FOR STROKE. Time course is not indicated. EXAM: CT HEAD WITHOUT CONTRAST TECHNIQUE: Contiguous axial images were obtained from the base of the skull through the vertex without intravenous contrast. COMPARISON:  None. FINDINGS: Diffuse cerebral atrophy. Ventricular dilatation consistent with central atrophy. Patchy areas of low-attenuation change in the deep white matter consistent with small vessel ischemia. No mass effect or midline shift. No abnormal extra-axial fluid collections. Gray-white matter junctions are distinct. Basal cisterns are not effaced. No evidence of acute  intracranial hemorrhage. No depressed skull fractures. Mucosal thickening throughout the left maxillary antrum, left ethmoid air cells, left frontal, and left sphenoid sinuses. Minimal mucosal thickening in the right paranasal sinuses. Visualized mastoid air cells are not opacified. Vascular calcifications. IMPRESSION: No acute intracranial abnormalities. Chronic atrophy and small vessel  ischemic changes. Electronically Signed   By: Lucienne Capers M.D.   On: 03/18/2016 18:19   Dg Chest Port 1 View  03/19/2016  CLINICAL DATA:  Dyspnea for 2 months, worsened over the past 2 days. EXAM: PORTABLE CHEST 1 VIEW COMPARISON:  03/18/2016 FINDINGS: Multifocal consolidation has mildly worsened from 03/18/2016, now with more confluent opacity in the central right lung and in the left upper lobe periphery. There may be a left pleural IMPRESSION: Worsening consolidation Electronically Signed   By: Andreas Newport M.D.   On: 03/19/2016 01:19   Dg Chest Portable 1 View  03/18/2016  CLINICAL DATA:  Fever, altered mental status, history of hypertension and diabetes EXAM: PORTABLE CHEST 1 VIEW COMPARISON:  None. FINDINGS: There is extensive infiltrate over the left lung. This obscures the left heart border. Heart size is therefore difficult to establish but appears mildly large. To a lesser degree there is patchy multifocal right-sided infiltrate. No pleural effusions. IMPRESSION: Bilateral left greater than right infiltrates. Bilateral pneumonia is suspected. Radiographic follow-up recommended. Electronically Signed   By: Skipper Cliche M.D.   On: 03/18/2016 18:09   I have personally reviewed and evaluated these images and lab results as part of my medical decision-making.   EKG Interpretation   Date/Time:  Monday March 19 2016 05:01:40 EDT Ventricular Rate:  162 PR Interval:    QRS Duration: 96 QT Interval:  269 QTC Calculation: 442 R Axis:   111 Text Interpretation:  Atrial fibrillation with rapid V-rate  Right axis  deviation Repolarization abnormality, prob rate related afb is new  Confirmed by Kathrynn Humble, MD, Thel[facility] Comp [phone]) on 03/19/2016 5:18:27 AM      MDM   Final diagnoses:  Sepsis, unspecified organism ([place])  Atrial fibrillation, new onset ([place])  Hypotension, unspecified  Septic shock ([place])  AKI (acute kidney injury) ([place])  Left leg swelling  SOB (shortness of breath)    Admit to [place], MD 03/19/16 [phone]

## 2016-03-19 NOTE — Progress Notes (Signed)
Pt family bringing in home cpap for pt. RT will monitor as necessary.

## 2016-03-19 NOTE — ED Notes (Signed)
Soft mittens applied to prevent tube from being dislodged.

## 2016-03-19 NOTE — ED Notes (Signed)
Foley placed at 0600.  Documentation time incorrect.

## 2016-03-19 NOTE — Progress Notes (Signed)
Echocardiogram 2D Echocardiogram has been performed.  Joelene Millin 03/19/2016, 2:44 PM

## 2016-03-19 NOTE — ED Notes (Signed)
Pt's CBG result was 125. Informed Hassan Rowan - RN.

## 2016-03-19 NOTE — Consult Note (Signed)
PULMONARY / CRITICAL CARE MEDICINE   Name: Julian Ross MRN: [id number] DOB: Oct 28, 1946    ADMISSION DATE:  03/18/2016 CONSULTATION DATE:  03/19/16  REFERRING MD:  Fuller Plan  CHIEF COMPLAINT:  Shortness of breath, confusion  HISTORY OF PRESENT ILLNESS:   Mr. Julian Ross is a 51M w/ history of HTN, DMII, CAD s/p stent, prior MI, HLD who presents with worsening shortness of breath over the last few days. On arrival to the ED he was noted to by hypoxemic, febrile, tachy, and hypotensive. CXR concerning for bilateral pneumonia. Since arrival in the ED and after fluid resuscitation, his respiratory status has declined. On my arrival he is being intubated.  Per the records, he has had worsening dyspnea over the past 2-3 months as well as falls related to generalized weakness. Over the last several days increasing congestion and less tolerant of home CPAP. No sputum production, however. Some increased swelling of the lower extremities and weight gain over the last 2 weeks. Today he was talking "loopy" so they brought him to the ED.   PAST MEDICAL HISTORY :  He  has a past medical history of MI (myocardial infarction) ([place]); Diabetes mellitus without complication ([place]); and Hypertension.  PAST SURGICAL HISTORY: He  has past surgical history that includes Coronary angioplasty with stent and Angioplasty.  Allergies  Allergen Reactions  . Atenolol   . Contrast Media [Iodinated Diagnostic Agents]   . Penicillins     No current facility-administered medications on file prior to encounter.   No current outpatient prescriptions on file prior to encounter.    FAMILY HISTORY:  His has no family status information on file.   SOCIAL HISTORY: He  reports that he has never smoked. He does not have any smokeless tobacco history on file. He reports that he does not drink alcohol or use illicit drugs.  REVIEW OF SYSTEMS:   Unable to obtain 2/2 intubated state  SUBJECTIVE:    VITAL SIGNS: BP  148/89 mmHg  Pulse 129  Temp(Src) 102.8 F (39.3 C) (Rectal)  Resp 25  Ht 5\' 11"  (1.803 m)  Wt 108.863 kg (240 lb)  BMI 33.49 kg/m2  SpO2 80%  HEMODYNAMICS:    VENTILATOR SETTINGS:    INTAKE / OUTPUT:    PHYSICAL EXAMINATION:  General Well nourished, well developed, intubated, sedated  HEENT No gross abnormalities. ETT in place. PERRL  Pulmonary Coarse breath sounds bilaterally on anterior exam. Vent-assisted effort, symmetrical expansion.   Cardiovascular Tachy 150s, irregularly irregular. S1, s2. No m/r/g. Distal pulses palpable.  Abdomen Soft, non-tender, distended, positive bowel sounds, no palpable organomegaly or masses. hyperresonant to percussion.  Musculoskeletal No bony abnormalities  Lymphatics No cervical, supraclavicular or axillary adenopathy.   Neurologic Grossly intact. No focal deficits.   Skin/Integuement No rash, no cyanosis, no clubbing.      LABS:  BMET  Recent Labs Lab 03/18/16 1607 03/18/16 1621 03/19/16 0338  NA 135 134* 136  K 4.4 4.3 4.3  CL 102 100* 103  CO2 22  --  21*  BUN 19 21* 22*  CREATININE 2.13* 2.10* 2.09*  GLUCOSE 278* 266* 130*    Electrolytes  Recent Labs Lab 03/18/16 1607 03/18/16 2034 03/19/16 0338  CALCIUM 9.1  --  8.3*  MG  --  2.0  --     CBC  Recent Labs Lab 03/18/16 1607 03/18/16 1621 03/19/16 0338  WBC 16.7*  --  16.8*  HGB 10.2* 11.2* 10.1*  HCT 30.8* 33.0* 30.3*  PLT 192  --  [facility] Lab 03/18/16 1607  APTT 33  INR 1.29    Sepsis Markers  Recent Labs Lab 03/18/16 1711 03/18/16 2034 03/18/16 2050 03/19/16 0011  LATICACIDVEN 4.52*  --  2.6*  2.60* 3.9*  PROCALCITON  --  1.23  --   --     ABG  Recent Labs Lab 03/19/16 0506  PHART 7.208*  PCO2ART 41.3  PO2ART 56.0*    Liver Enzymes  Recent Labs Lab 03/18/16 1607 03/19/16 0338  AST 37 76*  ALT 19 27  ALKPHOS 42 40  BILITOT 0.7 0.7  ALBUMIN 2.8* 2.8*    Cardiac Enzymes  Recent  Labs Lab 03/18/16 2034 03/19/16 0010 03/19/16 0217  TROPONINI 0.13* 0.11* 0.10*    Glucose  Recent Labs Lab 03/18/16 1639 03/18/16 2138 03/18/16 2333  GLUCAP 232* 235* 237*    Imaging Ct Head Wo Contrast  03/18/2016  CLINICAL DATA:  STROKE -LIKE SYMPTOMS-PLEASE EVALUATE FOR STROKE. Time course is not indicated. EXAM: CT HEAD WITHOUT CONTRAST TECHNIQUE: Contiguous axial images were obtained from the base of the skull through the vertex without intravenous contrast. COMPARISON:  None. FINDINGS: Diffuse cerebral atrophy. Ventricular dilatation consistent with central atrophy. Patchy areas of low-attenuation change in the deep white matter consistent with small vessel ischemia. No mass effect or midline shift. No abnormal extra-axial fluid collections. Gray-white matter junctions are distinct. Basal cisterns are not effaced. No evidence of acute intracranial hemorrhage. No depressed skull fractures. Mucosal thickening throughout the left maxillary antrum, left ethmoid air cells, left frontal, and left sphenoid sinuses. Minimal mucosal thickening in the right paranasal sinuses. Visualized mastoid air cells are not opacified. Vascular calcifications. IMPRESSION: No acute intracranial abnormalities. Chronic atrophy and small vessel ischemic changes. Electronically Signed   By: Lucienne Capers M.D.   On: 03/18/2016 18:19   Dg Chest Port 1 View  03/19/2016  CLINICAL DATA:  Dyspnea for 2 months, worsened over the past 2 days. EXAM: PORTABLE CHEST 1 VIEW COMPARISON:  03/18/2016 FINDINGS: Multifocal consolidation has mildly worsened from 03/18/2016, now with more confluent opacity in the central right lung and in the left upper lobe periphery. There may be a left pleural IMPRESSION: Worsening consolidation Electronically Signed   By: Andreas Newport M.D.   On: 03/19/2016 01:19   Dg Chest Portable 1 View  03/18/2016  CLINICAL DATA:  Fever, altered mental status, history of hypertension and diabetes  EXAM: PORTABLE CHEST 1 VIEW COMPARISON:  None. FINDINGS: There is extensive infiltrate over the left lung. This obscures the left heart border. Heart size is therefore difficult to establish but appears mildly large. To a lesser degree there is patchy multifocal right-sided infiltrate. No pleural effusions. IMPRESSION: Bilateral left greater than right infiltrates. Bilateral pneumonia is suspected. Radiographic follow-up recommended. Electronically Signed   By: Skipper Cliche M.D.   On: 03/18/2016 18:09     STUDIES:    CULTURES: Blood cx 3/19  ANTIBIOTICS: Vanc 3/19 Aztreonam 3/19 Levaquin 3/19  SIGNIFICANT EVENTS:   LINES/TUBES: ETT 3/20 Foley 3/20  DISCUSSION: Mr. Dubow is a 60M with acute hypoxemic respiratory failure requiring mechanical ventilation. He has a significant cardiac history with prior MI and prior stenting. No recent echo on file. No prior pulmonary history per the records. He is volume up but likely also has a dense pneumonia. Initial soft blood pressures responded to fluids.   ASSESSMENT / PLAN:  PULMONARY A: Acute hypoxemic respiratory failure Pneumonia P:   Continue ventilatory support Wean as  tolerated High PEEP / low TV strategy Antibiotics (vanc, levaquin, aztreonam) for PNA Diurese as BP tolerates  CARDIOVASCULAR A:  New onset A fib Prior MI Hx CAD s/p PCI Mild troponin leak Volume overload P:  Rate control as BP allows Given therapeutic lovenox by hospitalist Trend troponin Diurese as BP allows   RENAL A:   Cr 2.13; unknown baseline, likely AKI Lactic acidosis P:   Monitor Creatinine, UOP Avoid nephrotoxins Trend lactate  GASTROINTESTINAL A:   No acute issues P:   NPO for now Stress ulcer ppx  HEMATOLOGIC A:   No acute issues P:  Monitor  INFECTIOUS A:   Severe sepsis 2/2 pneumonia P:   Broad spectrum abx (vanc, aztreonam, levaquin) Monitor BP Trend lactate  ENDOCRINE A:   DMII P:   Accuchecks and  SSI  NEUROLOGIC A:   Altered mental status P:   RASS goal: -1 to -3 for vent synchrony Wean sedation as tolerated  FAMILY  - Updates: hospitalist team attempted to update family regarding intubation  - Inter-disciplinary family meet or Palliative Care meeting due by:  day 7  The patient is critically ill with multiple organ system failure and requires high complexity decision making for assessment and support, frequent evaluation and titration of therapies, advanced monitoring, review of radiographic studies and interpretation of complex data.   Critical Care Time devoted to patient care services, exclusive of separately billable procedures, described in this note is 55 minutes.   Yisroel Ramming, MD [facility]monary and [place] Pager: [phone]  03/19/2016, 5:21 AM

## 2016-03-19 NOTE — ED Notes (Signed)
Family at bedside.  Pt calm. VS stable.

## 2016-03-20 ENCOUNTER — Inpatient Hospital Stay (HOSPITAL_COMMUNITY): Payer: Medicare Other

## 2016-03-20 DIAGNOSIS — R4182 Altered mental status, unspecified: Secondary | ICD-10-CM

## 2016-03-20 LAB — POCT I-STAT 3, ART BLOOD GAS (G3+)
ACID-BASE DEFICIT: 7 mmol/L — AB (ref 0.0–2.0)
Bicarbonate: 20.9 mEq/L (ref 20.0–24.0)
O2 Saturation: 83 %
PH ART: 7.229 — AB (ref 7.350–7.450)
TCO2: 22 mmol/L (ref 0–100)
pCO2 arterial: 50 mmHg — ABNORMAL HIGH (ref 35.0–45.0)
pO2, Arterial: 57 mmHg — ABNORMAL LOW (ref 80.0–100.0)

## 2016-03-20 LAB — COMPREHENSIVE METABOLIC PANEL
ALT: 26 U/L (ref 17–63)
ANION GAP: 15 (ref 5–15)
AST: 52 U/L — AB (ref 15–41)
Albumin: 2.3 g/dL — ABNORMAL LOW (ref 3.5–5.0)
Alkaline Phosphatase: 37 U/L — ABNORMAL LOW (ref 38–126)
BILIRUBIN TOTAL: 0.8 mg/dL (ref 0.3–1.2)
BUN: 26 mg/dL — AB (ref 6–20)
CO2: 17 mmol/L — ABNORMAL LOW (ref 22–32)
Calcium: 8.2 mg/dL — ABNORMAL LOW (ref 8.9–10.3)
Chloride: 105 mmol/L (ref 101–111)
Creatinine, Ser: 1.81 mg/dL — ABNORMAL HIGH (ref 0.61–1.24)
GFR, EST AFRICAN AMERICAN: 42 mL/min — AB (ref >60)
GFR, EST NON AFRICAN AMERICAN: 36 mL/min — AB (ref >60)
Glucose, Bld: 224 mg/dL — ABNORMAL HIGH (ref 65–99)
POTASSIUM: 4.4 mmol/L (ref 3.5–5.1)
Sodium: 137 mmol/L (ref 135–145)
TOTAL PROTEIN: 5.5 g/dL — AB (ref 6.5–8.1)

## 2016-03-20 LAB — HEMOGLOBIN A1C
Hgb A1c MFr Bld: 9.2 % — ABNORMAL HIGH (ref 4.8–5.6)
Mean Plasma Glucose: 217 mg/dL

## 2016-03-20 LAB — GLUCOSE, CAPILLARY
GLUCOSE-CAPILLARY: 139 mg/dL — AB (ref 65–99)
GLUCOSE-CAPILLARY: 289 mg/dL — AB (ref 65–99)
GLUCOSE-CAPILLARY: 302 mg/dL — AB (ref 65–99)
Glucose-Capillary: 202 mg/dL — ABNORMAL HIGH (ref 65–99)
Glucose-Capillary: 278 mg/dL — ABNORMAL HIGH (ref 65–99)
Glucose-Capillary: 277 mg/dL — ABNORMAL HIGH (ref 65–99)

## 2016-03-20 LAB — CBC WITH DIFFERENTIAL/PLATELET
Basophils Absolute: 0 10*3/uL (ref 0.0–0.1)
Basophils Relative: 0 %
EOS PCT: 0 %
Eosinophils Absolute: 0 10*3/uL (ref 0.0–0.7)
HCT: 28.2 % — ABNORMAL LOW (ref 39.0–52.0)
HEMOGLOBIN: 9.3 g/dL — AB (ref 13.0–17.0)
Lymphocytes Relative: 5 %
Lymphs Abs: 0.5 10*3/uL — ABNORMAL LOW (ref 0.7–4.0)
MCH: 31.1 pg (ref 26.0–34.0)
MCHC: 33 g/dL (ref 30.0–36.0)
MCV: 94.3 fL (ref 78.0–100.0)
MONOS PCT: 4 %
Monocytes Absolute: 0.4 10*3/uL (ref 0.1–1.0)
NEUTROS PCT: 91 %
Neutro Abs: 9.8 10*3/uL — ABNORMAL HIGH (ref 1.7–7.7)
PLATELETS: 178 10*3/uL (ref 150–400)
RBC: 2.99 MIL/uL — ABNORMAL LOW (ref 4.22–5.81)
RDW: 14.6 % (ref 11.5–15.5)
WBC: 10.7 10*3/uL — AB (ref 4.0–10.5)

## 2016-03-20 LAB — LEGIONELLA PNEUMOPHILA TOTAL AB

## 2016-03-20 LAB — HEPARIN LEVEL (UNFRACTIONATED): HEPARIN UNFRACTIONATED: 0.28 [IU]/mL — AB (ref 0.30–0.70)

## 2016-03-20 LAB — MAGNESIUM: Magnesium: 2.2 mg/dL (ref 1.7–2.4)

## 2016-03-20 MED ORDER — FUROSEMIDE 10 MG/ML IJ SOLN
INTRAMUSCULAR | Status: AC
  Filled 2016-03-20: qty 8

## 2016-03-20 MED ORDER — FUROSEMIDE 10 MG/ML IJ SOLN
60.0000 mg | Freq: Once | INTRAMUSCULAR | Status: AC
  Administered 2016-03-20: 60 mg via INTRAVENOUS

## 2016-03-20 MED ORDER — LORAZEPAM 2 MG/ML IJ SOLN
INTRAMUSCULAR | Status: AC
  Filled 2016-03-20: qty 1

## 2016-03-20 MED ORDER — ANTISEPTIC ORAL RINSE SOLUTION (CORINZ)
7.0000 mL | Freq: Four times a day (QID) | OROMUCOSAL | Status: DC
  Administered 2016-03-20 – 2016-03-22 (×8): 7 mL via OROMUCOSAL

## 2016-03-20 MED ORDER — LORAZEPAM 2 MG/ML IJ SOLN
2.0000 mg | INTRAMUSCULAR | Status: DC | PRN
Start: 2016-03-20 — End: 2016-03-28
  Administered 2016-03-20 – 2016-03-21 (×2): 2 mg via INTRAVENOUS
  Administered 2016-03-26: 1 mg via INTRAVENOUS
  Administered 2016-03-27 – 2016-03-28 (×2): 2 mg via INTRAVENOUS
  Filled 2016-03-20 (×4): qty 1

## 2016-03-20 MED ORDER — HYDROCORTISONE NA SUCCINATE PF 100 MG IJ SOLR
25.0000 mg | Freq: Two times a day (BID) | INTRAMUSCULAR | Status: AC
  Administered 2016-03-20 – 2016-03-21 (×3): 25 mg via INTRAVENOUS
  Filled 2016-03-20: qty 1
  Filled 2016-03-20 (×3): qty 0.5

## 2016-03-20 MED ORDER — PRO-STAT SUGAR FREE PO LIQD
60.0000 mL | Freq: Two times a day (BID) | ORAL | Status: DC
  Administered 2016-03-20 – 2016-03-23 (×6): 60 mL
  Filled 2016-03-20 (×12): qty 60

## 2016-03-20 MED ORDER — HEPARIN BOLUS VIA INFUSION
1000.0000 [IU] | Freq: Once | INTRAVENOUS | Status: AC
  Administered 2016-03-20: 1000 [IU] via INTRAVENOUS
  Filled 2016-03-20: qty 1000

## 2016-03-20 MED ORDER — HEPARIN BOLUS VIA INFUSION
4000.0000 [IU] | Freq: Once | INTRAVENOUS | Status: AC
  Administered 2016-03-20: 4000 [IU] via INTRAVENOUS
  Filled 2016-03-20: qty 4000

## 2016-03-20 MED ORDER — VITAL HIGH PROTEIN PO LIQD
1000.0000 mL | ORAL | Status: DC
  Administered 2016-03-20 – 2016-03-22 (×2): 1000 mL

## 2016-03-20 MED ORDER — INSULIN ASPART 100 UNIT/ML ~~LOC~~ SOLN
0.0000 [IU] | SUBCUTANEOUS | Status: DC
  Administered 2016-03-20: 15 [IU] via SUBCUTANEOUS
  Administered 2016-03-20 – 2016-03-21 (×3): 11 [IU] via SUBCUTANEOUS
  Administered 2016-03-21: 4 [IU] via SUBCUTANEOUS
  Administered 2016-03-21 (×2): 7 [IU] via SUBCUTANEOUS
  Administered 2016-03-21: 4 [IU] via SUBCUTANEOUS
  Administered 2016-03-21: 7 [IU] via SUBCUTANEOUS
  Administered 2016-03-22: 4 [IU] via SUBCUTANEOUS
  Administered 2016-03-22: 11 [IU] via SUBCUTANEOUS
  Administered 2016-03-22 (×2): 7 [IU] via SUBCUTANEOUS
  Administered 2016-03-22: 4 [IU] via SUBCUTANEOUS
  Administered 2016-03-23 (×2): 7 [IU] via SUBCUTANEOUS
  Administered 2016-03-23: 4 [IU] via SUBCUTANEOUS
  Administered 2016-03-23 (×2): 7 [IU] via SUBCUTANEOUS
  Administered 2016-03-23 (×2): 4 [IU] via SUBCUTANEOUS
  Administered 2016-03-24: 11 [IU] via SUBCUTANEOUS
  Administered 2016-03-24 (×2): 7 [IU] via SUBCUTANEOUS
  Administered 2016-03-24: 4 [IU] via SUBCUTANEOUS
  Administered 2016-03-24 – 2016-03-25 (×2): 11 [IU] via SUBCUTANEOUS
  Administered 2016-03-25: 4 [IU] via SUBCUTANEOUS
  Administered 2016-03-25: 7 [IU] via SUBCUTANEOUS
  Administered 2016-03-25: 4 [IU] via SUBCUTANEOUS
  Administered 2016-03-25: 11 [IU] via SUBCUTANEOUS
  Administered 2016-03-25 – 2016-03-26 (×2): 4 [IU] via SUBCUTANEOUS
  Administered 2016-03-26: 7 [IU] via SUBCUTANEOUS
  Administered 2016-03-26 (×3): 4 [IU] via SUBCUTANEOUS
  Administered 2016-03-27: 3 [IU] via SUBCUTANEOUS
  Administered 2016-03-27: 4 [IU] via SUBCUTANEOUS
  Administered 2016-03-27: 3 [IU] via SUBCUTANEOUS
  Administered 2016-03-27: 11 [IU] via SUBCUTANEOUS
  Administered 2016-03-27: 7 [IU] via SUBCUTANEOUS
  Administered 2016-03-27 – 2016-03-28 (×3): 4 [IU] via SUBCUTANEOUS
  Administered 2016-03-28: 3 [IU] via SUBCUTANEOUS
  Administered 2016-03-28: 15 [IU] via SUBCUTANEOUS
  Administered 2016-03-28 – 2016-03-29 (×2): 4 [IU] via SUBCUTANEOUS
  Administered 2016-03-29: 11 [IU] via SUBCUTANEOUS
  Administered 2016-03-29: 4 [IU] via SUBCUTANEOUS
  Administered 2016-03-29: 11 [IU] via SUBCUTANEOUS
  Administered 2016-03-29: 3 [IU] via SUBCUTANEOUS
  Administered 2016-03-30: 11 [IU] via SUBCUTANEOUS
  Administered 2016-03-30: 3 [IU] via SUBCUTANEOUS
  Administered 2016-03-30 (×2): 7 [IU] via SUBCUTANEOUS
  Administered 2016-03-30: 11 [IU] via SUBCUTANEOUS
  Administered 2016-03-31: 7 [IU] via SUBCUTANEOUS
  Administered 2016-03-31: 4 [IU] via SUBCUTANEOUS
  Administered 2016-03-31: 7 [IU] via SUBCUTANEOUS
  Administered 2016-03-31: 3 [IU] via SUBCUTANEOUS
  Administered 2016-03-31: 11 [IU] via SUBCUTANEOUS
  Administered 2016-03-31: 3 [IU] via SUBCUTANEOUS
  Administered 2016-04-01 (×2): 4 [IU] via SUBCUTANEOUS

## 2016-03-20 MED ORDER — HEPARIN (PORCINE) IN NACL 100-0.45 UNIT/ML-% IJ SOLN
2000.0000 [IU]/h | INTRAMUSCULAR | Status: DC
  Administered 2016-03-20: 1400 [IU]/h via INTRAVENOUS
  Administered 2016-03-21: 1550 [IU]/h via INTRAVENOUS
  Administered 2016-03-22 – 2016-03-24 (×5): 1850 [IU]/h via INTRAVENOUS
  Administered 2016-03-25 – 2016-03-29 (×7): 2000 [IU]/h via INTRAVENOUS
  Filled 2016-03-20 (×31): qty 250

## 2016-03-20 MED ORDER — DEXMEDETOMIDINE HCL IN NACL 200 MCG/50ML IV SOLN
0.4000 ug/kg/h | INTRAVENOUS | Status: DC
  Administered 2016-03-20 – 2016-03-21 (×4): 0.4 ug/kg/h via INTRAVENOUS
  Administered 2016-03-21: 0.3 ug/kg/h via INTRAVENOUS
  Administered 2016-03-21: 0.4 ug/kg/h via INTRAVENOUS
  Administered 2016-03-21: 0.3 ug/kg/h via INTRAVENOUS
  Administered 2016-03-21 – 2016-03-22 (×3): 0.4 ug/kg/h via INTRAVENOUS
  Filled 2016-03-20 (×10): qty 50

## 2016-03-20 MED ORDER — CHLORHEXIDINE GLUCONATE 0.12% ORAL RINSE (MEDLINE KIT)
15.0000 mL | Freq: Two times a day (BID) | OROMUCOSAL | Status: DC
  Administered 2016-03-20 – 2016-03-22 (×4): 15 mL via OROMUCOSAL

## 2016-03-20 NOTE — Progress Notes (Signed)
Initial Nutrition Assessment  DOCUMENTATION CODES:   Obesity unspecified  INTERVENTION:  -Increase Vital High Protein to goal rate of 50 ml/h (1200 ml/d)  -Provide 60 ml Prostat BID   Tube feeding regimen provides 1600 kcal, 165 grams of protein, and 1003 ml of free water  NUTRITION DIAGNOSIS:   Inadequate oral intake related to inability to eat as evidenced by NPO status.  GOAL:   Provide needs based on ASPEN/SCCM guidelines  MONITOR:   TF tolerance, Weight trends, Vent status, Labs  REASON FOR ASSESSMENT:   Consult Enteral/tube feeding initiation and management  ASSESSMENT:   Pt w/ history of HTN, DMII, CAD s/p stent, prior MI, HLD who presents with worsening shortness of breath over the last few days.    3/20-Intubated 3/20-OG tube placed  MV: 13.1L/min Temp (24hrs), Avg:100.3 F (37.9 C), Min:98.2 F (36.8 C), Max:101.7 F (38.7 C) Propofol: None  No family present at bedside to obtain nutrition history.   Pt currently receiving Vital High Protein @ 40 ml/hr, provides 960 kcal, 84 grams of protein, 802 ml of free water  Conducted nutrition focused physical exam identified no muscle wasting, no fat wasting.   Medications reviewed. Labs reviewed.   Diet Order:  Diet NPO time specified  Skin:  Reviewed, no issues  Last BM:  PTA  Height:   Ht Readings from Last 1 Encounters:  03/19/16 5\' 11"  (1.803 m)    Weight:   Wt Readings from Last 1 Encounters:  03/20/16 234 lb 12.6 oz (106.5 kg)    Ideal Body Weight:  78.1 kg  BMI:  Body mass index is 32.76 kg/(m^2).  Estimated Nutritional Needs:   Kcal:  [id number]  Protein:  >/= 156 grams  Fluid:  1.1-1.4 L  EDUCATION NEEDS:   No education needs identified at this time  Raford Pitcher, Dietetic Intern Pager: [phone]

## 2016-03-20 NOTE — Progress Notes (Signed)
Inpatient Diabetes Program Recommendations  AACE/ADA: New Consensus Statement on Inpatient Glycemic Control (2015)  Target Ranges:  Prepandial:   less than 140 mg/dL      Peak postprandial:   less than 180 mg/dL (1-2 hours)      Critically ill patients:  140 - 180 mg/dL   Review of Glycemic Control  Diabetes history: DM2 Outpatient Diabetes medications: 50/50 50-40-40 tidwc Current orders for Inpatient glycemic control: Novolog resistant Q4H  Results for BEAUMONT, WHITENIGHT (MRN [id number]) as of 03/20/2016 15:13  Ref. Range 03/20/2016 03:21 03/20/2016 08:12 03/20/2016 11:52  Glucose-Capillary Latest Ref Range: 65-99 mg/dL 202 (H) 278 (H) 289 (H)  Results for KYMANI, HARDT (MRN [id number]) as of 03/20/2016 15:13  Ref. Range 03/18/2016 20:35  Hemoglobin A1C Latest Ref Range: 4.8-5.6 % 9.2 (H)  Needs basal insulin.  Inpatient Diabetes Program Recommendations:    Recommend ICU Hyperglycemia Protocol. If not protocol, please consider starting Levemir 25 units Q24H.  HgbA1C of 9.2% indicates poor control at home. Will need home meds adjusted prior to discharge.  Thank you. Lorenda Peck, RD, LDN, CDE Inpatient Diabetes Coordinator [phone]

## 2016-03-20 NOTE — Progress Notes (Signed)
[facility]  The patient's wife indicates that he has always wished to be a DNR.  Will change code status to limited code blue, no CPR, vent, pressors OK.  Roselie Awkward, MD [place] [facility] Pager: [phone] Cell: [phone] After 3pm or if no response, call [phone]

## 2016-03-20 NOTE — Progress Notes (Signed)
STAT EEG completed; results pending. 

## 2016-03-20 NOTE — Progress Notes (Signed)
PULMONARY / CRITICAL CARE MEDICINE   Name: Julian Ross MRN: [id number] DOB: 11-06-1946    ADMISSION DATE:  03/18/2016 CONSULTATION DATE:  03/19/16  REFERRING MD:  Fuller Plan  CHIEF COMPLAINT:  Shortness of breath, confusion  SUBJECTIVE:  Oxygenation improving, some tremors with stimulation/agitation this morning, thick secretions via ETT tube  VITAL SIGNS: BP 157/95 mmHg  Pulse 136  Temp(Src) 98.2 F (36.8 C) (Oral)  Resp 16  Ht 5\' 11"  (1.803 m)  Wt 106.5 kg (234 lb 12.6 oz)  BMI 32.76 kg/m2  SpO2 100%  HEMODYNAMICS:    VENTILATOR SETTINGS: Vent Mode:  [-] PRVC FiO2 (%):  [40 %-100 %] 50 % Set Rate:  [16 bmp] 16 bmp Vt Set:  [600 mL-660 mL] 600 mL PEEP:  [5 cmH20-12 cmH20] 5 cmH20 Plateau Pressure:  [24 cmH20-35 cmH20] 24 cmH20  INTAKE / OUTPUT: I/O last 3 completed shifts: In: [id number] [I.V.:4340; NG/GT:600; IV Piggyback:350] Out: [id number] [Urine:3225]  PHYSICAL EXAMINATION: General: sedated on vent Neuro: sedate on vent, intermittently awakens CV: irreg irreg today, no mgr PULM: coarse breath sounds left lung, vent supported breaths GI: soft, nontender, no masses Extremities: no edema, warm, acyanotic   LABS:  BMET  Recent Labs Lab 03/19/16 0622 03/19/16 1545 03/20/16 0350  NA 136 137 137  K 4.2 4.6 4.4  CL 103 104 105  CO2 18* 18* 17*  BUN 23* 22* 26*  CREATININE 2.07* 1.80* 1.81*  GLUCOSE 135* 111* 224*    Electrolytes  Recent Labs Lab 03/18/16 2034  03/19/16 0622 03/19/16 1545 03/20/16 0350  CALCIUM  --   < > 8.2* 8.4* 8.2*  MG 2.0  --  1.8  --  2.2  PHOS  --   --  3.3  --   --   < > = values in this interval not displayed.  CBC  Recent Labs Lab 03/19/16 0338 03/19/16 0622 03/20/16 0350  WBC 16.8* 18.3* 10.7*  HGB 10.1* 10.4* 9.3*  HCT 30.3* 30.8* 28.2*  PLT 195 198 178    Coag's  Recent Labs Lab 03/18/16 1607  APTT 33  INR 1.29    Sepsis Markers  Recent Labs Lab 03/18/16 2034 03/18/16 2050 03/19/16 0011  03/19/16 1531  LATICACIDVEN  --  2.6*  2.60* 3.9* 1.4  PROCALCITON 1.23  --   --   --     ABG  Recent Labs Lab 03/19/16 0506 03/19/16 0618 03/19/16 1523  PHART 7.208* 7.224* 7.294*  PCO2ART 41.3 40.8 41.7  PO2ART 56.0* 66.0* 243.0*    Liver Enzymes  Recent Labs Lab 03/18/16 1607 03/19/16 0338 03/20/16 0350  AST 37 76* 52*  ALT 19 27 26   ALKPHOS 42 40 37*  BILITOT 0.7 0.7 0.8  ALBUMIN 2.8* 2.8* 2.3*    Cardiac Enzymes  Recent Labs Lab 03/18/16 2034 03/19/16 0010 03/19/16 0217  TROPONINI 0.13* 0.11* 0.10*    Glucose  Recent Labs Lab 03/19/16 1212 03/19/16 1505 03/19/16 2007 03/19/16 2329 03/20/16 0321 03/20/16 0812  GLUCAP 125* 99 112* 139* 202* 278*    Imaging No results found.   STUDIES:  CT Head 3/19 >> no acute process, chronic atrophy & small vessl disease ECHO 3/20 >> LVEF 30-35%, global hypokinesis, PA estimate 39mm Hg  CULTURES: Blood cx 3/19 >> RVP 3/19 >>  Flu 3/19 >> neg   ANTIBIOTICS: Vanc 3/19 >> Aztreonam 3/19 >> Levaquin 3/19 >>  SIGNIFICANT EVENTS: 3/19  Admit with suspected PNA, hypoxic respiratory failure  LINES/TUBES: ETT 3/20 Foley 3/20  DISCUSSION: Julian Ross is a 53M with acute hypoxemic respiratory failure requiring mechanical ventilation. He has a significant cardiac history with prior MI and prior stenting, RA on methotrexate/pred. He is volume up but likely also has a dense pneumonia. Initial soft BP responded to IVF.    ASSESSMENT / PLAN:  PULMONARY A: Acute hypoxemic respiratory failure - in setting of PNA +/- edema HCAP - immunocompromised with RA Hx ARDS - 10 days in ICU after cath with vessel injury  OSA - on CPAP, ? compliance Never Smoker P:   Change to 8cc/kg IBW Wean PEEP / FiO2 for sats > 92%  See ID Diurese today Intermittent CXR Will need CPAP QHS post extubation  Daily WUA/SBT  CARDIOVASCULAR A:  New onset A fib - rate controlled at present, suspect secondary to acute  infection  Acute decompensated CHF (LVEF 30-35% is worse per wife) Mild troponin leak Prior MI Hx CAD s/p PCI - reportedly has 16 stents per wife Hx ICM  P:  Heparin infusion ICU monitoring of hemodynamics Continue plavix  Lasix today Continue to hold cozaar Consider b-blocker, defer to cardiology  RENAL A:   Acute on chronic kidney failure CKD III AG / Lactic acidosis P:   Renal dose meds Monitor BMET and UOP Replace electrolytes as needed   GASTROINTESTINAL A:   At Risk Protein Calorie Malnutrition GERD   P:   Stress ulcer ppx TF now  HEMATOLOGIC A:   Anemia - suspect element of chronic disease P:  Monitor CBC Lovenox for DVT prophylaxis   INFECTIOUS A:   Severe sepsis secondary to pneumonia P:   Broad spectrum abx (vanc, aztreonam, levaquin), continue again today, narrow tomorrow Monitor BP  AUTOIMMUNE:  A:  RA -  on plaquenil / prednisone 5mg  QD P:  Hold home plaquenil, prednisone 5mg    ENDOCRINE A:   DMII Prednisone dependent P:   Accuchecks and SSI Wean hydrocortisone  NEUROLOGIC A:   Altered mental status Anxiety - uses xanax at home, wife reports will use all Rx in one week then none for 3 weeks Diabetic Neuropathy  Essential Tremor  Partial Blindness  Hx CVA  P:   RASS goal: -2 for vent synchrony Fentanyl gtt for pain  Precedex> start today Hold home paxil Monitor for benzo withdrawal   FAMILY  - Updates:  Wife updated extensively at bedside 3/21.  Pt has two sons - one neurosurgeon and a dentist.    - Inter-disciplinary family meet or Palliative Care meeting due by:  3/27  My cc time 40 minutes  Roselie Awkward, MD [place] [facility] Pager: [phone] Cell: [phone] After 3pm or if no response, call [phone]

## 2016-03-20 NOTE — Consult Note (Signed)
CARDIOLOGY CONSULT NOTE   Patient ID: Julian Ross MRN: [id number] DOB/AGE: 1946-09-11 70 y.o.  Admit date: 03/18/2016  Primary Physician   No PCP Per Patient Primary Cardiologist   [place], [place] Reason for Consultation   Decreased EF and Afib Requesting Physician  Dr. Lake Bells  HPI: Julian Ross is a 70 y.o. male with a history of CAD s/p multiple PCI, prior MI, HTN, DM, HL ,MI who presented 03/18/16 with progressive worsening of sob for the past 2-3 months with multiple falls. Workup reveled septic shock with multifocal pneumonia and new onset afib at controlled rate. He remained intubated since admission. On IV heparin for anticoagulation. 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 Echo done yesterday showed LV EF of 30-35%, mild LVH, hypokinesis with severe  inferior hypokinesis to akinesis. mild MR, mild RAE, mild TR, RVSP 35 mmHg, dilated IVC. The patient has seizure like activity this morning with thick secretions via ETT tube--> pending EEG.   No family at bedside. Notes obtained from records. Nurse said that patient's cardiologist at [place], [place]. Updated to request medical records. Per Care Everywhere echo 2008 with ef of 45%.   PAST MEDICAL HISTORY per Care Everywhere:   CAD. He had an MI in 1995 which was located in the inferior wall status post angioplasty to the RCA. He had another MI in 1998 status post catheterization and angioplasty to the left circ and again in 2002 he had catheterization which revealed LAD 75% stenosis status post 2 stents to the LAD at [facility] and in 2003 he had again angioplasty to the left circ x3 and in 2005 he developed unstable angina and received 3 drug eluting stents to the RCA.  10/02/2006 Transferred from [place] [facility] with resp distress s/p NSTEMI. Had complicated post MI course to include PCI/stent of 75%mid RCA  with dissection requiring 7 additional stents to repair, hemopytisis, pneumonia, agitation and finally intubation    Pt has two sons - one neurosurgeon and a dentist.  Inter-disciplinary family meet or Palliative Care meeting due by:-->3/27.  Past Medical History  Diagnosis Date  . MI (myocardial infarction) ([place])   . Diabetes mellitus without complication ([place])   . Hypertension      Past Surgical History  Procedure Laterality Date  . Coronary angioplasty with stent placement      x 10  . Angioplasty      Allergies  Allergen Reactions  . Ace Inhibitors Cough  . Atenolol   . Contrast Media [Iodinated Diagnostic Agents] Rash  . Penicillins Hives and Rash    I have reviewed the patient's current medications . antiseptic oral rinse  7 mL Mouth Rinse QID  . aztreonam  2 g Intravenous 3 times per day  . chlorhexidine gluconate  15 mL Mouth Rinse BID  . Chlorhexidine Gluconate Cloth  6 each Topical Q0600  . clopidogrel  75 mg Per Tube Daily  . famotidine  20 mg Per Tube Daily  . feeding supplement (PRO-STAT SUGAR FREE 64)  60 mL Per Tube BID  . guaiFENesin  15 mL Per Tube 4 times per day  . hydrocortisone sod succinate (SOLU-CORTEF) inj  25 mg Intravenous Q12H  . insulin aspart  0-20 Units Subcutaneous 6 times per day  . ipratropium-albuterol  3 mL Nebulization Q6H  . levofloxacin (LEVAQUIN) IV  750 mg Intravenous Q48H  . mupirocin ointment  1 application  Nasal BID  . vancomycin  1,250 mg Intravenous Q24H   . dexmedetomidine 0.4 mcg/kg/hr (03/20/16 1305)  . feeding supplement (VITAL HIGH PROTEIN)    . fentaNYL infusion INTRAVENOUS 200 mcg/hr (03/20/16 0730)  . heparin 1,400 Units/hr (03/20/16 1140)   sodium chloride, acetaminophen, bisacodyl, fentaNYL, ipratropium-albuterol, LORazepam, midazolam, midazolam  Prior to Admission medications   Medication Sig Start Date End Date Taking? Authorizing Provider  ALPRAZolam (NIRAVAM) 0.5 MG dissolvable tablet Take 0.5 mg by mouth  at bedtime as needed for anxiety.   Yes Historical Provider, MD  Cholecalciferol (VITAMIN D3) 5000 units CAPS Take 5,000 Units by mouth daily. 02/28/16  Yes Historical Provider, MD  clopidogrel (PLAVIX) 75 MG tablet Take 75 mg by mouth daily.   Yes Historical Provider, MD  folic acid (FOLVITE) 1 MG tablet Take 1 mg by mouth daily.   Yes Historical Provider, MD  furosemide (LASIX) 20 MG tablet Take 20 mg by mouth daily.   Yes Historical Provider, MD  gabapentin (NEURONTIN) 300 MG capsule Take 300 mg by mouth 3 (three) times daily.   Yes Historical Provider, MD  HYDROcodone-acetaminophen (NORCO/VICODIN) 5-325 MG tablet Take 1 tablet by mouth 3 (three) times daily as needed for moderate pain.   Yes Historical Provider, MD  insulin lispro protamine-lispro (HUMALOG 50/50 MIX) (50-50) 100 UNIT/ML SUSP injection Inject 40-50 Units into the skin 3 (three) times daily with meals. Inject 50 units subcutaneously with breakfast and 40 units with lunch & supper   Yes Historical Provider, MD  leucovorin (WELLCOVORIN) 10 MG tablet Take 10 mg by mouth See admin instructions. Take 1 tablet (10 mg) by mouth every 12 hours and 24 hours after the methotrexate dose   Yes Historical Provider, MD  losartan (COZAAR) 50 MG tablet Take 50 mg by mouth daily.   Yes Historical Provider, MD  Methotrexate Sodium (METHOTREXATE, PF,) 50 MG/2ML injection Inject 15 mg into the muscle once a week. Inject 0.6 ml (15 mg) 03/09/16  Yes Historical Provider, MD  omega-3 acid ethyl esters (LOVAZA) 1 g capsule Take 1 g by mouth 2 (two) times daily.  02/21/16  Yes Historical Provider, MD  PARoxetine (PAXIL) 40 MG tablet Take 40 mg by mouth daily.    Yes Historical Provider, MD  potassium chloride SA (K-DUR,KLOR-CON) 20 MEQ tablet Take 20 mEq by mouth daily. 02/21/16  Yes Historical Provider, MD  predniSONE (DELTASONE) 5 MG tablet Take 5 mg by mouth daily with breakfast.   Yes Historical Provider, MD  pregabalin (LYRICA) 100 MG capsule Take 100 mg  by mouth 2 (two) times daily.   Yes Historical Provider, MD  tamsulosin (FLOMAX) 0.4 MG CAPS capsule Take 0.4 mg by mouth daily.  02/21/16  Yes Historical Provider, MD  tiZANidine (ZANAFLEX) 4 MG tablet Take 4 mg by mouth 3 (three) times daily. 02/28/16  Yes Historical Provider, MD  topiramate (TOPAMAX) 25 MG tablet Take 25 mg by mouth 2 (two) times daily. 02/21/16  Yes Historical Provider, MD  traMADol (ULTRAM) 50 MG tablet Take 100 mg by mouth 3 (three) times daily as needed (pain).  03/14/16  Yes Historical Provider, MD  bimatoprost (LUMIGAN) 0.01 % SOLN Place 1 drop into both eyes at bedtime.    Historical Provider, MD  methylPREDNISolone (MEDROL DOSEPAK) 4 MG TBPK tablet Take 4 mg by mouth See admin instructions. Dose pack- take as directed.    Historical Provider, MD     FAMILY HISTORY: Depression and obsessive compulsive disorder. Unable to obtained cardiac hx as patient  is intubated.   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 He denies any use of alcohol, tobacco or substance use. He is married and lives with his wife in [place], [place].   ROS:  Full 14 point review of systems complete and found to be negative unless listed above.  Physical Exam: Blood pressure 139/86, pulse 105, temperature 98.9 F (37.2 C), temperature source Oral, resp. rate 23, height 5\' 11"  (1.803 m), weight 234 lb 12.6 oz (106.5 kg), SpO2 100 %.  General: Sedated on vent Head: Eyes PERRLA, No xanthomas. Normocephalic and atraumatic, oropharynx without edema or exudate.  Lungs: Resp regular and unlabored, CTA anteriorly with course breath sound.  Heart: RRR no s3, s4, or murmurs..   Neck: No carotid bruits. No lymphadenopathy.  No JVD. Abdomen: Bowel sounds present, abdomen soft and non-tender.  Msk:  No spine or cva tenderness. No weakness, no joint deformities or effusions. Extremities: No clubbing, cyanosis or  edema. DP/PT/Radials 2+ and equal bilaterally. Neuro: on vent. Sedated.  Psych:  sedated Skin: No rashes or lesions noted.  Labs:   Lab Results  Component Value Date   WBC 10.7* 03/20/2016   HGB 9.3* 03/20/2016   HCT 28.2* 03/20/2016   MCV 94.3 03/20/2016   PLT 178 03/20/2016    Recent Labs  03/18/16 1607  INR 1.29    Recent Labs Lab 03/20/16 0350  NA 137  K 4.4  CL 105  CO2 17*  BUN 26*  CREATININE 1.81*  CALCIUM 8.2*  PROT 5.5*  BILITOT 0.8  ALKPHOS 37*  ALT 26  AST 52*  GLUCOSE 224*  ALBUMIN 2.3*   MAGNESIUM  Date Value Ref Range Status  03/20/2016 2.2 1.7 - 2.4 mg/dL Final    Recent Labs  03/18/16 2034 03/19/16 0010 03/19/16 0217  TROPONINI 0.13* 0.11* 0.10*    Recent Labs  03/18/16 1620  TROPIPOC 0.13*   No results found for: PROBNP No results found for: CHOL, HDL, LDLCALC, TRIG Lab Results  Component Value Date   DDIMER 0.81* 03/19/2016   No results found for: LIPASE, AMYLASE TSH  Date/Time Value Ref Range Status  03/18/2016 08:50 PM 0.501 0.350 - 4.500 uIU/mL Final   No results found for: VITAMINB12, FOLATE, FERRITIN, TIBC, IRON, RETICCTPCT  Echo: 03/19/16 LV EF: 30% - 35%  ------------------------------------------------------------------- Indications: Dyspnea 786.09.  ------------------------------------------------------------------- History: PMH: Sepsis. Risk factors: Diabetes mellitus.  ------------------------------------------------------------------- Study Conclusions  - Left ventricle: The cavity size was normal. Wall thickness was  increased in a pattern of mild LVH. Systolic function was  moderately to severely reduced. The estimated ejection fraction  was in the range of 30% to 35%. Global hypokinesis with severe  inferior hypokinesis to akinesis. The study is not technically  sufficient to allow evaluation of LV diastolic function. - Mitral valve: Mildly thickened leaflets . There was  mild  regurgitation. - Left atrium: The atrium was normal in size. - Right atrium: The atrium was mildly dilated. - Tricuspid valve: There was mild regurgitation. - Pulmonary arteries: PA peak pressure: 35 mm Hg (S). - Inferior vena cava: The vessel was dilated. The respirophasic  diameter changes were blunted (< 50%), consistent with elevated  central venous pressure.  Impressions:  - LVEF 30-35%, global hypokinesis and inferior akinesis, mild MR,  mild RAE, mild TR, RVSP 35 mmHg, dilated IVC.   ECG:   Vent. rate 91 BPM PR interval *  ms QRS duration 102 ms QT/QTc 354/435 ms P-R-T axes * 78 71  Radiology:  Ct Head Wo Contrast  03/18/2016  CLINICAL DATA:  STROKE -LIKE SYMPTOMS-PLEASE EVALUATE FOR STROKE. Time course is not indicated. EXAM: CT HEAD WITHOUT CONTRAST TECHNIQUE: Contiguous axial images were obtained from the base of the skull through the vertex without intravenous contrast. COMPARISON:  None. FINDINGS: Diffuse cerebral atrophy. Ventricular dilatation consistent with central atrophy. Patchy areas of low-attenuation change in the deep white matter consistent with small vessel ischemia. No mass effect or midline shift. No abnormal extra-axial fluid collections. Gray-white matter junctions are distinct. Basal cisterns are not effaced. No evidence of acute intracranial hemorrhage. No depressed skull fractures. Mucosal thickening throughout the left maxillary antrum, left ethmoid air cells, left frontal, and left sphenoid sinuses. Minimal mucosal thickening in the right paranasal sinuses. Visualized mastoid air cells are not opacified. Vascular calcifications. IMPRESSION: No acute intracranial abnormalities. Chronic atrophy and small vessel ischemic changes. Electronically Signed   By: Lucienne Capers M.D.   On: 03/18/2016 18:19   Dg Chest Port 1 View  03/20/2016  CLINICAL DATA:  Acute respiratory failure with hypoxemia. EXAM: PORTABLE CHEST 1 VIEW COMPARISON:  Chest x-rays  dated 03/19/2016 and 03/18/2016. FINDINGS: Endotracheal tube remains adequately positioned with tip just above the level of the carina. Enteric tube passes below the diaphragm. Cardiomediastinal silhouette appears stable in size and configuration. There is persistent central pulmonary vascular congestion and patchy bilateral airspace opacities which are most suggestive of pulmonary edema pattern. Perhaps mildly improved aeration within the left lung. No pneumothorax seen. IMPRESSION: Patchy bilateral airspace opacities, multifocal pneumonia versus pulmonary edema, not significantly changed in overall extent, perhaps slightly improved aeration within the left lung. Favor pulmonary edema. ARDS is another consideration. Electronically Signed   By: Franki Cabot M.D.   On: 03/20/2016 13:20   Dg Chest Port 1 View  03/19/2016  CLINICAL DATA:  Respiratory failure EXAM: PORTABLE CHEST 1 VIEW COMPARISON:  03/19/2016 FINDINGS: The endotracheal tube is 5.5 cm above the carina. Multifocal consolidation persists bilaterally, probably not significantly changed. No pneumothorax. IMPRESSION: Satisfactorily positioned ETT. No significant interval change in the bilateral airspace opacities. Electronically Signed   By: Andreas Newport M.D.   On: 03/19/2016 06:28   Dg Chest Port 1 View  03/19/2016  CLINICAL DATA:  Dyspnea for 2 months, worsened over the past 2 days. EXAM: PORTABLE CHEST 1 VIEW COMPARISON:  03/18/2016 FINDINGS: Multifocal consolidation has mildly worsened from 03/18/2016, now with more confluent opacity in the central right lung and in the left upper lobe periphery. There may be a left pleural IMPRESSION: Worsening consolidation Electronically Signed   By: Andreas Newport M.D.   On: 03/19/2016 01:19   Dg Chest Portable 1 View  03/18/2016  CLINICAL DATA:  Fever, altered mental status, history of hypertension and diabetes EXAM: PORTABLE CHEST 1 VIEW COMPARISON:  None. FINDINGS: There is extensive infiltrate  over the left lung. This obscures the left heart border. Heart size is therefore difficult to establish but appears mildly large. To a lesser degree there is patchy multifocal right-sided infiltrate. No pleural effusions. IMPRESSION: Bilateral left greater than right infiltrates. Bilateral pneumonia is suspected. Radiographic follow-up recommended. Electronically Signed   By: Skipper Cliche M.D.   On: 03/18/2016 18:09    ASSESSMENT AND PLAN:     Julian Ross is a 70 y.o. male with a history of CAD s/p multiple PCI, prior MI, HTN, DM, HL ,MI who presented 03/18/16 with progressive worsening  of sob for the past 2-3 months with multiple falls. Workup reveled septic shock with multifocal pneumonia and new onset afib at controlled rate.   1. Ischemic cardiomyopathy with hx of multiple PCI as noted in H&P - Last known EF of 45% on echo 2008. He could have echo afterwards +/- cath. Updated nurse to obtained medical records from primary cardiologist.  - Echo done yesterday showed LV EF of 30-35%, mild LVH, hypokinesis with severe  inferior hypokinesis to akinesis. mild MR, mild RAE, mild TR, RVSP 35 mmHg, dilated IVC. - Troponin flat trend 0.13-->0.11-->0.10. D-dimer of 0.81. 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 - Not candidate for ischemic evaluation currently.   2. New onset Afib - unknown duration. Rate controlled. CHADSVASCs score of 4 (age, HTN, vascular disease and DM). Continue heparin for anticoagulation for now. Likely due to acute illness.    Principal Problem:   Septic shock (HCC) Active Problems:   CKD (chronic kidney disease), stage III   Edema leg   Diabetes mellitus type 2 in obese (HCC)   Elevated troponin   Anemia   Acute hypoxemic respiratory failure ([place])   SignedLeanor Kail, PA 03/20/2016, 1:52 PM Pager [phone]  Co-Sign MD

## 2016-03-20 NOTE — Progress Notes (Signed)
RT note-VT decreased per protocol.

## 2016-03-20 NOTE — Progress Notes (Signed)
Wasted 15cc of versed in sink.  Witnessed by Alfred Levins. RN

## 2016-03-20 NOTE — Progress Notes (Signed)
ANTICOAGULATION CONSULT NOTE - Follow Up Consult  Pharmacy Consult for heparin  Indication: atrial fibrillation  Allergies  Allergen Reactions  . Ace Inhibitors Cough  . Atenolol Other (See Comments)    Unknown reaction  . Contrast Media [Iodinated Diagnostic Agents] Rash  . Penicillins Hives and Rash    Has patient had a PCN reaction causing immediate rash, facial/tongue/throat swelling, SOB or lightheadedness with hypotension: Yes Has patient had a PCN reaction causing severe rash involving mucus membranes or skin necrosis: No Has patient had a PCN reaction that required hospitalization pt was in the hospital at time of last reaction - heart attack Has patient had a PCN reaction occurring within the last 10 years: No If all of the above answers are "NO", then may proceed with Cephalosporin use.    Patient Measurements: Height: 5\' 11"  (180.3 cm) Weight: 234 lb 12.6 oz (106.5 kg) IBW/kg (Calculated) : 75.3 Heparin Dosing Weight: 98.5 kg  Vital Signs: Temp: 98.8 F (37.1 C) (03/21 2017) Temp Source: Oral (03/21 2017) BP: 125/73 mmHg (03/21 1930) Pulse Rate: 73 (03/21 1930)  Labs:  Recent Labs  03/18/16 1607  03/18/16 2034 03/19/16 0010 03/19/16 0217 03/19/16 0338 03/19/16 0622 03/19/16 1545 03/20/16 0350 03/20/16 1800  HGB 10.2*  < >  --   --   --  10.1* 10.4*  --  9.3*  --   HCT 30.8*  < >  --   --   --  30.3* 30.8*  --  28.2*  --   PLT 192  --   --   --   --  195 198  --  178  --   APTT 33  --   --   --   --   --   --   --   --   --   LABPROT 16.2*  --   --   --   --   --   --   --   --   --   INR 1.29  --   --   --   --   --   --   --   --   --   HEPARINUNFRC  --   --   --   --   --   --   --   --   --  0.28*  CREATININE 2.13*  < >  --   --   --  2.09* 2.07* 1.80* 1.81*  --   TROPONINI  --   --  0.13* 0.11* 0.10*  --   --   --   --   --   < > = values in this interval not displayed.  Estimated Creatinine Clearance: 47.8 mL/min (by C-G formula based on Cr of  1.81).   Medical History: Past Medical History  Diagnosis Date  . MI (myocardial infarction) ([place])   . Diabetes mellitus without complication ([place])   . Hypertension     Assessment:  70 yo M with severe CAP, intubated and sedated with new afib.  Pharmacy consulted to dose heparin for afib.  Initial HL is sub-therapeutic at 0.28. Hg 9.3, pltc OK, no bleeding reported per RN  Goal of Therapy:  Heparin level 0.3-0.7 units/ml Monitor platelets by anticoagulation protocol: Yes   Plan:  Give 1000 units bolus x 1 Increase heparin infusion to 1550 units/hr Check anti-Xa level in 6 hours and daily while on heparin Continue to monitor H&H and platelets  Albertina Parr, PharmD., BCPS  Clinical Pharmacist Pager [phone]

## 2016-03-20 NOTE — Progress Notes (Signed)
ANTICOAGULATION CONSULT NOTE - Initial Consult  Pharmacy Consult for heparin  Indication: atrial fibrillation  Allergies  Allergen Reactions  . Ace Inhibitors Cough  . Atenolol   . Contrast Media [Iodinated Diagnostic Agents] Rash  . Penicillins Hives and Rash    Patient Measurements: Height: 5\' 11"  (180.3 cm) Weight: 234 lb 12.6 oz (106.5 kg) IBW/kg (Calculated) : 75.3 Heparin Dosing Weight: 98.5 kg  Vital Signs: Temp: 98.2 F (36.8 C) (03/21 0815) Temp Source: Oral (03/21 0815) BP: 157/95 mmHg (03/21 1000) Pulse Rate: 136 (03/21 1000)  Labs:  Recent Labs  03/18/16 1607  03/18/16 2034 03/19/16 0010 03/19/16 0217 03/19/16 0338 03/19/16 0622 03/19/16 1545 03/20/16 0350  HGB 10.2*  < >  --   --   --  10.1* 10.4*  --  9.3*  HCT 30.8*  < >  --   --   --  30.3* 30.8*  --  28.2*  PLT 192  --   --   --   --  195 198  --  178  APTT 33  --   --   --   --   --   --   --   --   LABPROT 16.2*  --   --   --   --   --   --   --   --   INR 1.29  --   --   --   --   --   --   --   --   CREATININE 2.13*  < >  --   --   --  2.09* 2.07* 1.80* 1.81*  TROPONINI  --   --  0.13* 0.11* 0.10*  --   --   --   --   < > = values in this interval not displayed.  Estimated Creatinine Clearance: 47.8 mL/min (by C-G formula based on Cr of 1.81).   Medical History: Past Medical History  Diagnosis Date  . MI (myocardial infarction) ([place])   . Diabetes mellitus without complication ([place])   . Hypertension     Assessment:  70 yo M with severe CAP, intubated and sedated with new afib.  Pharmacy consulted to dose heparin for afib.  No home anticoag per wife. HDW 98.5 kg. Creat 1.81.  Hg 11.2>10.4>9.8 pltc OK, no bleeding reported.   Goal of Therapy:  Heparin level 0.3-0.7 units/ml Monitor platelets by anticoagulation protocol: Yes   Plan:  Give 4000 units bolus x 1 Start heparin infusion at 1400 units/hr Check anti-Xa level in 6 hours and daily while on heparin Continue to monitor H&H  and platelets  Eudelia Bunch, Pharm.D. BP:[phone] 03/20/2016 11:01 AM

## 2016-03-20 NOTE — Progress Notes (Signed)
[facility] Sub-I review of I/E criteria  - appears to meet criteria for MIND ICU study.  - CRN to approach family  Dr. Brand Males, M.D., [facility].C.P Pulmonary and Critical Care Medicine Staff Physician [place] [facility] Pager: [phone], If no answer or between  15:00h - 7:00h: call [phone]  03/20/2016 1:27 PM

## 2016-03-20 NOTE — Procedures (Signed)
ELECTROENCEPHALOGRAM REPORT  Date of Study: 03/20/2016  Patient's Name: Julian Ross MRN: [id number] Date of Birth: [birth year]  Referring Provider: Dr. Simonne Maffucci  Clinical History: This is a 70 year old man with altered mental status, intubated for airway protection. He is reported to have occasional tremors when agitated. EEG to assess for seizure activity.  Medications: dexmedetomidine (PRECEDEX) 200 MCG/50ML (4 mcg/mL) infusion fentaNYL (SUBLIMAZE) 2,500 mcg in sodium chloride 0.9 % 250 mL (10 mcg/mL) infusion acetaminophen (TYLENOL) tablet 650 mg aztreonam (AZACTAM) 2 g in dextrose 5 % 50 mL IVPB clopidogrel (PLAVIX) tablet 75 mg famotidine (PEPCID) 40 MG/5ML suspension 20 mg levofloxacin (LEVAQUIN) IVPB 750 mg vancomycin (VANCOCIN) 1,250 mg in sodium chloride 0.9 % 250 mL IVPB  Technical Summary: A multichannel digital EEG recording measured by the international 10-20 system with electrodes applied with paste and impedances below 5000 ohms performed as portable with EKG monitoring in an intubated and sedated patient.  Hyperventilation and photic stimulation were not performed.  The digital EEG was referentially recorded, reformatted, and digitally filtered in a variety of bipolar and referential montages for optimal display.   Description: The patient is intubated and sedated on Precedex and Fentanyl during the recording. There is no clear posterior dominant rhythm. The background consists of a large amount of low voltage diffuse 4-5 Hz theta and 2-3 Hz delta slowing. Normal sleep architecture was not seen. Hyperventilation and photic stimulation were not performed. With noxious stimulation, there is minimal reactivity noted. There is artifact seen over the left frontopolar region. There were no epileptiform discharges or electrographic seizures seen.    EKG lead was unremarkable.  Impression: This sedated EEG is abnormal due to moderate diffuse low voltage slowing of the  background.  Clinical Correlation of the above findings indicates diffuse cerebral dysfunction that is non-specific in etiology and can be seen with hypoxic/ischemic injury, toxic/metabolic encephalopathies, or medication effect from Fentanyl and Precedex. Typical tremor events were not captured. There were no electrographic seizures in this study. The absence of epileptiform discharges does not rule out a clinical diagnosis of epilepsy.  Clinical correlation is advised.   Ellouise Newer, M.D.

## 2016-03-20 NOTE — Progress Notes (Signed)
[facility]  Seizure like activity vs tremor. EEG stat Ativan now and prn  Roselie Awkward, MD [facility] Pager: [phone] Cell: [phone] After 3pm or if no response, call [phone]

## 2016-03-20 NOTE — Progress Notes (Signed)
RT note-Patient with increased cough, suctioned for minimal secretions however, catheter difficult to pass. Lavaged with NS for small occluded plug from ETT. Increased fio2 will wean again once recovered.

## 2016-03-21 ENCOUNTER — Inpatient Hospital Stay (HOSPITAL_COMMUNITY): Payer: Medicare Other

## 2016-03-21 ENCOUNTER — Encounter (HOSPITAL_COMMUNITY): Payer: Self-pay | Admitting: Neurology

## 2016-03-21 DIAGNOSIS — G253 Myoclonus: Secondary | ICD-10-CM

## 2016-03-21 LAB — BASIC METABOLIC PANEL
Anion gap: 6 (ref 5–15)
BUN: 38 mg/dL — AB (ref 6–20)
CO2: 22 mmol/L (ref 22–32)
Calcium: 8.3 mg/dL — ABNORMAL LOW (ref 8.9–10.3)
Chloride: 107 mmol/L (ref 101–111)
Creatinine, Ser: 1.54 mg/dL — ABNORMAL HIGH (ref 0.61–1.24)
GFR calc Af Amer: 51 mL/min — ABNORMAL LOW (ref >60)
GFR, EST NON AFRICAN AMERICAN: 44 mL/min — AB (ref >60)
GLUCOSE: 208 mg/dL — AB (ref 65–99)
POTASSIUM: 3.9 mmol/L (ref 3.5–5.1)
Sodium: 135 mmol/L (ref 135–145)

## 2016-03-21 LAB — RESPIRATORY VIRUS PANEL
ADENOVIRUS: NEGATIVE
ADENOVIRUS: NEGATIVE
INFLUENZA A: NEGATIVE
INFLUENZA A: NEGATIVE
INFLUENZA B 1: NEGATIVE
INFLUENZA B 1: NEGATIVE
METAPNEUMOVIRUS: NEGATIVE
METAPNEUMOVIRUS: NEGATIVE
PARAINFLUENZA 3 A: NEGATIVE
Parainfluenza 1: NEGATIVE
Parainfluenza 1: NEGATIVE
Parainfluenza 2: NEGATIVE
Parainfluenza 2: NEGATIVE
Parainfluenza 3: NEGATIVE
RESPIRATORY SYNCYTIAL VIRUS A: NEGATIVE
RESPIRATORY SYNCYTIAL VIRUS A: NEGATIVE
RESPIRATORY SYNCYTIAL VIRUS B: NEGATIVE
RHINOVIRUS: NEGATIVE
Respiratory Syncytial Virus B: NEGATIVE
Rhinovirus: NEGATIVE

## 2016-03-21 LAB — GLUCOSE, CAPILLARY
GLUCOSE-CAPILLARY: 191 mg/dL — AB (ref 65–99)
GLUCOSE-CAPILLARY: 214 mg/dL — AB (ref 65–99)
Glucose-Capillary: 198 mg/dL — ABNORMAL HIGH (ref 65–99)
Glucose-Capillary: 227 mg/dL — ABNORMAL HIGH (ref 65–99)
Glucose-Capillary: 234 mg/dL — ABNORMAL HIGH (ref 65–99)
Glucose-Capillary: 266 mg/dL — ABNORMAL HIGH (ref 65–99)

## 2016-03-21 LAB — CBC WITH DIFFERENTIAL/PLATELET
BASOS ABS: 0 10*3/uL (ref 0.0–0.1)
BASOS PCT: 0 %
EOS PCT: 0 %
Eosinophils Absolute: 0 10*3/uL (ref 0.0–0.7)
HEMATOCRIT: 28.5 % — AB (ref 39.0–52.0)
Hemoglobin: 9.3 g/dL — ABNORMAL LOW (ref 13.0–17.0)
LYMPHS ABS: 1 10*3/uL (ref 0.7–4.0)
Lymphocytes Relative: 9 %
MCH: 30.1 pg (ref 26.0–34.0)
MCHC: 32.6 g/dL (ref 30.0–36.0)
MCV: 92.2 fL (ref 78.0–100.0)
MONOS PCT: 4 %
Monocytes Absolute: 0.4 10*3/uL (ref 0.1–1.0)
NEUTROS ABS: 9.3 10*3/uL — AB (ref 1.7–7.7)
Neutrophils Relative %: 87 %
Platelets: 211 10*3/uL (ref 150–400)
RBC: 3.09 MIL/uL — ABNORMAL LOW (ref 4.22–5.81)
RDW: 14.1 % (ref 11.5–15.5)
WBC: 10.7 10*3/uL — ABNORMAL HIGH (ref 4.0–10.5)

## 2016-03-21 LAB — CULTURE, RESPIRATORY W GRAM STAIN: Culture: NO GROWTH

## 2016-03-21 LAB — AMMONIA: Ammonia: 43 umol/L — ABNORMAL HIGH (ref 9–35)

## 2016-03-21 LAB — HEPARIN LEVEL (UNFRACTIONATED)
HEPARIN UNFRACTIONATED: 0.34 [IU]/mL (ref 0.30–0.70)
HEPARIN UNFRACTIONATED: 0.33 [IU]/mL (ref 0.30–0.70)

## 2016-03-21 LAB — CULTURE, RESPIRATORY: SPECIAL REQUESTS: NORMAL

## 2016-03-21 MED ORDER — PAROXETINE HCL 20 MG PO TABS
40.0000 mg | ORAL_TABLET | Freq: Every day | ORAL | Status: DC
  Administered 2016-03-21 – 2016-03-29 (×9): 40 mg via ORAL
  Filled 2016-03-21 (×16): qty 2

## 2016-03-21 MED ORDER — FOLIC ACID 1 MG PO TABS
1.0000 mg | ORAL_TABLET | Freq: Every day | ORAL | Status: DC
  Administered 2016-03-21 – 2016-04-02 (×13): 1 mg via ORAL
  Filled 2016-03-21 (×19): qty 1

## 2016-03-21 MED ORDER — GABAPENTIN 300 MG PO CAPS
300.0000 mg | ORAL_CAPSULE | Freq: Three times a day (TID) | ORAL | Status: DC
  Administered 2016-03-21 – 2016-03-28 (×19): 300 mg via ORAL
  Filled 2016-03-21 (×28): qty 1

## 2016-03-21 MED ORDER — TOPIRAMATE 25 MG PO TABS
25.0000 mg | ORAL_TABLET | Freq: Two times a day (BID) | ORAL | Status: DC
  Administered 2016-03-21 – 2016-03-30 (×18): 25 mg via ORAL
  Filled 2016-03-21 (×29): qty 1

## 2016-03-21 MED ORDER — FUROSEMIDE 10 MG/ML IJ SOLN
60.0000 mg | Freq: Four times a day (QID) | INTRAMUSCULAR | Status: AC
  Administered 2016-03-21 (×2): 60 mg via INTRAVENOUS
  Filled 2016-03-21 (×2): qty 6

## 2016-03-21 MED ORDER — LEVETIRACETAM 500 MG/5ML IV SOLN
1000.0000 mg | Freq: Two times a day (BID) | INTRAVENOUS | Status: DC
  Administered 2016-03-21 – 2016-03-28 (×14): 1000 mg via INTRAVENOUS
  Filled 2016-03-21 (×17): qty 10

## 2016-03-21 MED ORDER — POTASSIUM CHLORIDE 20 MEQ/15ML (10%) PO SOLN
40.0000 meq | Freq: Once | ORAL | Status: AC
  Administered 2016-03-21: 40 meq
  Filled 2016-03-21: qty 30

## 2016-03-21 NOTE — Progress Notes (Signed)
ANTICOAGULATION CONSULT NOTE - Follow Up Consult  Pharmacy Consult for heparin  Indication: atrial fibrillation  Allergies  Allergen Reactions  . Ace Inhibitors Cough  . Atenolol Other (See Comments)    Unknown reaction  . Contrast Media [Iodinated Diagnostic Agents] Rash  . Penicillins Hives and Rash    Has patient had a PCN reaction causing immediate rash, facial/tongue/throat swelling, SOB or lightheadedness with hypotension: Yes Has patient had a PCN reaction causing severe rash involving mucus membranes or skin necrosis: No Has patient had a PCN reaction that required hospitalization pt was in the hospital at time of last reaction - heart attack Has patient had a PCN reaction occurring within the last 10 years: No If all of the above answers are "NO", then may proceed with Cephalosporin use.    Patient Measurements: Height: 5\' 11"  (180.3 cm) Weight: 235 lb 10.8 oz (106.9 kg) IBW/kg (Calculated) : 75.3 Heparin Dosing Weight: 98.5 kg  Vital Signs: Temp: 99 F (37.2 C) (03/21 2356) Temp Source: Oral (03/21 2356) BP: 132/71 mmHg (03/22 0230) Pulse Rate: 64 (03/22 0230)  Labs:  Recent Labs  03/18/16 1607  03/18/16 2034 03/19/16 0010 03/19/16 0217  03/19/16 0622 03/19/16 1545 03/20/16 0350 03/20/16 1800 03/21/16 0250 03/21/16 0254  HGB 10.2*  < >  --   --   --   < > 10.4*  --  9.3*  --   --  9.3*  HCT 30.8*  < >  --   --   --   < > 30.8*  --  28.2*  --   --  28.5*  PLT 192  --   --   --   --   < > 198  --  178  --   --  211  APTT 33  --   --   --   --   --   --   --   --   --   --   --   LABPROT 16.2*  --   --   --   --   --   --   --   --   --   --   --   INR 1.29  --   --   --   --   --   --   --   --   --   --   --   HEPARINUNFRC  --   --   --   --   --   --   --   --   --  0.28* 0.34  --   CREATININE 2.13*  < >  --   --   --   < > 2.07* 1.80* 1.81*  --   --   --   TROPONINI  --   --  0.13* 0.11* 0.10*  --   --   --   --   --   --   --   < > = values in this  interval not displayed.  Estimated Creatinine Clearance: 47.9 mL/min (by C-G formula based on Cr of 1.81).  Assessment: 70 yo Ross on heparin for afib. Heparin level therapeutic (0.34) on gtt at 1550 units/hr. CBC stable. No bleeding noted.  Goal of Therapy:  Heparin level 0.3-0.7 units/ml Monitor platelets by anticoagulation protocol: Yes   Plan:  Continue heparin infusion at 1550 units/hr Will f/u 6 hr confirmatory level  Sherlon Handing, PharmD, BCPS Clinical pharmacist, pager [phone] 03/21/2016 3:30 AM

## 2016-03-21 NOTE — Progress Notes (Signed)
PULMONARY / CRITICAL CARE MEDICINE   Name: Julian Ross MRN: [id number] DOB: 1946-08-18    ADMISSION DATE:  03/18/2016 CONSULTATION DATE:  03/19/16  REFERRING MD:  Fuller Plan  CHIEF COMPLAINT:  Shortness of breath, confusion  SUBJECTIVE:   Periodic shaking, eeg neg, Failed sbt due to mental status, respiratory mechanics were OK   VITAL SIGNS: BP 105/74 mmHg  Pulse 100  Temp(Src) 98.5 F (36.9 C) (Oral)  Resp 16  Ht 5\' 11"  (1.803 m)  Wt 106.9 kg (235 lb 10.8 oz)  BMI 32.88 kg/m2  SpO2 100%  HEMODYNAMICS:    VENTILATOR SETTINGS: Vent Mode:  [-] PSV;CPAP FiO2 (%):  [40 %-60 %] 40 % Set Rate:  [20 bmp] 20 bmp Vt Set:  [600 mL] 600 mL PEEP:  [5 cmH20-8 cmH20] 5 cmH20 Pressure Support:  [5 cmH20] 5 cmH20 Plateau Pressure:  [23 cmH20-29 cmH20] 27 cmH20  INTAKE / OUTPUT: I/O last 3 completed shifts: In: 3721.8 [I.V.:1241.8; NG/GT:1830; IV Piggyback:650] Out: 3150 [Urine:3150]  PHYSICAL EXAMINATION: General: drowsy but awakens to voice Neuro: wakes up, focuses on me, doesn't follow commands, periodic rhythmic tremor when he wakes up CV: irreg irreg , no mgr PULM: CTA B today, vent supported breaths GI: soft, nontender, no masses Extremities: no edema, warm, acyanotic   LABS:  BMET  Recent Labs Lab 03/19/16 1545 03/20/16 0350 03/21/16 0254  NA 137 137 135  K 4.6 4.4 3.9  CL 104 105 107  CO2 18* 17* 22  BUN 22* 26* 38*  CREATININE 1.80* 1.81* 1.54*  GLUCOSE 111* 224* 208*    Electrolytes  Recent Labs Lab 03/18/16 2034  03/19/16 0622 03/19/16 1545 03/20/16 0350 03/21/16 0254  CALCIUM  --   < > 8.2* 8.4* 8.2* 8.3*  MG 2.0  --  1.8  --  2.2  --   PHOS  --   --  3.3  --   --   --   < > = values in this interval not displayed.  CBC  Recent Labs Lab 03/19/16 0622 03/20/16 0350 03/21/16 0254  WBC 18.3* 10.7* 10.7*  HGB 10.4* 9.3* 9.3*  HCT 30.8* 28.2* 28.5*  PLT 198 178 211    Coag's  Recent Labs Lab 03/18/16 1607  APTT 33  INR  1.29    Sepsis Markers  Recent Labs Lab 03/18/16 2034 03/18/16 2050 03/19/16 0011 03/19/16 1531  LATICACIDVEN  --  2.6*  2.60* 3.9* 1.4  PROCALCITON 1.23  --   --   --     ABG  Recent Labs Lab 03/19/16 0618 03/19/16 1523 03/20/16 1159  PHART 7.224* 7.294* 7.229*  PCO2ART 40.8 41.7 50.0*  PO2ART 66.0* 243.0* 57.0*    Liver Enzymes  Recent Labs Lab 03/18/16 1607 03/19/16 0338 03/20/16 0350  AST 37 76* 52*  ALT 19 27 26   ALKPHOS 42 40 37*  BILITOT 0.7 0.7 0.8  ALBUMIN 2.8* 2.8* 2.3*    Cardiac Enzymes  Recent Labs Lab 03/18/16 2034 03/19/16 0010 03/19/16 0217  TROPONINI 0.13* 0.11* 0.10*    Glucose  Recent Labs Lab 03/20/16 1152 03/20/16 1526 03/20/16 2015 03/20/16 2354 03/21/16 0402 03/21/16 0750  GLUCAP 289* 302* 277* 266* 191* 198*    Imaging 3/22 CXR images reviewed> improved left lung infiltrate, ETT in place  STUDIES:  CT Head 3/19 >> no acute process, chronic atrophy & small vessl disease ECHO 3/20 >> LVEF 30-35%, global hypokinesis, PA estimate 44mm Hg EEG 3/21 > slowing but no seizure  CULTURES: Blood  cx 3/19 >> RVP 3/19 >> neg Flu 3/19 >> neg   ANTIBIOTICS: Vanc 3/19 >> 3/22 Aztreonam 3/19 >> Levaquin 3/19 >> 3/22  SIGNIFICANT EVENTS: 3/19  Admit with suspected PNA, hypoxic respiratory failure  LINES/TUBES: ETT 3/20 Foley 3/20  DISCUSSION: Julian Ross is a 63M with acute hypoxemic respiratory failure requiring mechanical ventilation. He has a significant cardiac history with prior MI and prior stenting, RA on methotrexate/pred. He had HCAP and AKI on admission, both improving but has persistent tremors, uncertain if these represent seizure.  EEG negative 3/21.  Has new Afib and congestive heart failure.  ASSESSMENT / PLAN:  PULMONARY A: Acute hypoxemic respiratory failure - in setting of PNA +/- edema > improving HCAP - immunocompromised with RA OSA - on CPAP, ? compliance P:   Continue full vent support SBT  when more awake Wean PEEP / FiO2 for sats > 92%  Diurese today Will need CPAP QHS post extubation  Daily WUA/SBT  CARDIOVASCULAR A:  New onset A fib - rate controlled at present, suspect secondary to acute infection  Acute decompensated CHF (LVEF 30-35% is worse per wife) Mild troponin leak, demand ischemia Prior MI Hx CAD s/p PCI - reportedly has 16 stents per wife Hx ICM  P:  Heparin infusion ICU monitoring of hemodynamics Continue plavix  Lasix today again  Continue to hold cozaar for now Consider b-blocker, defer to cardiology  RENAL A:   Acute on chronic kidney failure > better CKD III AG / Lactic acidosis P:   Renal dose meds Monitor BMET and UOP Replace electrolytes as needed  GASTROINTESTINAL A:   At Risk Protein Calorie Malnutrition GERD   P:   Stress ulcer ppx Tube feedings to continue  HEMATOLOGIC A:   Anemia - suspect element of chronic disease P:  Monitor CBC Lovenox for DVT prophylaxis   INFECTIOUS A:   Severe sepsis secondary to pneumonia > improving P:   Continue aztreonam Stop vanc/levaquin   AUTOIMMUNE:  A:  RA -  on plaquenil / prednisone 5mg  QD P:  Hold home plaquenil, prednisone 5mg    ENDOCRINE A:   DMII Prednisone dependent P:   Accuchecks and SSI Continue low dose hydrocortisone, transition to prednisone 3/23  NEUROLOGIC A:   Acute encephalopathy from home polypharmacy, ICU delirium Recurrent tremors vs seizure?  Anxiety - uses xanax at home, wife reports will use all Rx in one week then none for 3 weeks Diabetic Neuropathy  Essential Tremor  Partial Blindness  Hx CVA  P:   RASS goal: -2 for vent synchrony Fentanyl gtt for pain  Precedex Hold home paxil Monitor for benzo withdrawal  Repeat EEG today and consult neurology  FAMILY  - Updates:  Wife updated extensively at bedside 3/212.  Updated son 3/22 (physician)    - Inter-disciplinary family meet or Palliative Care meeting due by:  3/27  My cc time 26  minutes  Roselie Awkward, MD [place] [facility] Pager: [phone] Cell: [phone] After 3pm or if no response, call [phone]

## 2016-03-21 NOTE — Progress Notes (Signed)
Pharmacy Antibiotic Note  Julian Ross is a 70 y.o. male admitted on 03/18/2016 with pneumonia.  Pharmacy has been consulted for aztreonam dosing.  SCr 1.54, unsure of baseline as past lab work from outside facilities is a few years old (1.78 in 11/2013 per [facility] records). Normalized CrCl ~36mL/min   Plan: -Discontinue vancomycin  -Discontinue levofloxacin -Continue aztreonam 2g IV q8h  Temp (24hrs), Avg:99.1 F (37.3 C), Min:98.5 F (36.9 C), Max:100.1 F (37.8 C)   Recent Labs Lab 03/18/16 1607  03/18/16 1622 03/18/16 1711 03/18/16 2050 03/19/16 0011 03/19/16 0338 03/19/16 0622 03/19/16 1531 03/19/16 1545 03/20/16 0350 03/21/16 0254  WBC 16.7*  --   --   --   --   --  16.8* 18.3*  --   --  10.7* 10.7*  CREATININE 2.13*  < >  --   --   --   --  2.09* 2.07*  --  1.80* 1.81* 1.54*  LATICACIDVEN  --   --  3.02* 4.52* 2.6*  2.60* 3.9*  --   --  1.4  --   --   --   < > = values in this interval not displayed.  Estimated Creatinine Clearance: 56.3 mL/min (by C-G formula based on Cr of 1.54).    Allergies  Allergen Reactions  . Ace Inhibitors Cough  . Atenolol Other (See Comments)    Unknown reaction  . Contrast Media [Iodinated Diagnostic Agents] Rash  . Penicillins Hives and Rash    Has patient had a PCN reaction causing immediate rash, facial/tongue/throat swelling, SOB or lightheadedness with hypotension: Yes Has patient had a PCN reaction causing severe rash involving mucus membranes or skin necrosis: No Has patient had a PCN reaction that required hospitalization pt was in the hospital at time of last reaction - heart attack Has patient had a PCN reaction occurring within the last 10 years: No If all of the above answers are "NO", then may proceed with Cephalosporin use.    Antimicrobials this admission: vancomycin 3/19 >> 3/22 aztreonam 3/19 >> Levofloxacin 3/19>> 3/22  Dose adjustments this admission: n/a  Microbiology results: 3/20 MRSA PCR  + 3/20 TA >>ngtd 3/20 RSV>>negative 3/19 BC 2>> ngtd x 2 d    Thank you for allowing [place] to participate in this patients care. Jens Som, PharmD Pager: [phone]  03/21/2016 4:08 PM

## 2016-03-21 NOTE — Progress Notes (Addendum)
PATIENT ID: [id number] with CAD s/p MI and PCI, chronic systolic heart failure, hypertension, hyperlipidemia, and diabetes mellitus who is here with hypoxic respiratory failure and pneumonia, new-onset atrial fibrillation.  INTERVAL HISTORY:  No events overnight. EEG revealed moderate diffuse low voltage slowing which is consistent with diffuse cerebral dysfunction that is nonspecific. This could be a medication effect from sedation or could represent hypoxic/ischemic injury or encephalopathy. No seizure activity was noted.  SUBJECTIVE:  Patient is intubated and sedated.   PHYSICAL EXAM Filed Vitals:   03/21/16 0753 03/21/16 0800 03/21/16 0821 03/21/16 0822  BP:  121/74    Pulse:  91    Temp: 98.5 F (36.9 C)     TempSrc: Oral     Resp:  22    Height:      Weight:      SpO2:  100% 100% 100%   General:  Intubated and sedated. Neck: JVP 1-2 cm above the clavicle at 45. Lungs:  Irregularly irregular. No murmurs, rubs, or gallops.  Heart:  Vent and breath sounds. Otherwise clear anteriorly. Abdomen:  Soft. Active bowel sounds. Extremities:  Warm and well-perfused. No edema. 2+ DP pulses bilaterally.  LABS: Lab Results  Component Value Date   TROPONINI 0.10* 03/19/2016   Results for orders placed or performed during the hospital encounter of 03/18/16 (from the past 24 hour(s))  Glucose, capillary     Status: Abnormal   Collection Time: 03/20/16 11:52 AM  Result Value Ref Range   Glucose-Capillary 289 (H) 65 - 99 mg/dL  I-STAT 3, arterial blood gas (G3+)     Status: Abnormal   Collection Time: 03/20/16 11:59 AM  Result Value Ref Range   pH, Arterial 7.229 (L) 7.350 - 7.450   pCO2 arterial 50.0 (H) 35.0 - 45.0 mmHg   pO2, Arterial 57.0 (L) 80.0 - 100.0 mmHg   Bicarbonate 20.9 20.0 - 24.0 mEq/L   TCO2 22 0 - 100 mmol/L   O2 Saturation 83.0 %   Acid-base deficit 7.0 (H) 0.0 - 2.0 mmol/L   Patient temperature 98.9 F    Collection site RADIAL, ALLEN'S TEST ACCEPTABLE    Sample  type ARTERIAL   Glucose, capillary     Status: Abnormal   Collection Time: 03/20/16  3:26 PM  Result Value Ref Range   Glucose-Capillary 302 (H) 65 - 99 mg/dL  Heparin level (unfractionated)     Status: Abnormal   Collection Time: 03/20/16  6:00 PM  Result Value Ref Range   Heparin Unfractionated 0.28 (L) 0.30 - 0.70 IU/mL  Glucose, capillary     Status: Abnormal   Collection Time: 03/20/16  8:15 PM  Result Value Ref Range   Glucose-Capillary 277 (H) 65 - 99 mg/dL  Glucose, capillary     Status: Abnormal   Collection Time: 03/20/16 11:54 PM  Result Value Ref Range   Glucose-Capillary 266 (H) 65 - 99 mg/dL   Comment 1 Notify RN   Heparin level (unfractionated)     Status: None   Collection Time: 03/21/16  2:50 AM  Result Value Ref Range   Heparin Unfractionated 0.34 0.30 - 0.70 IU/mL  Basic metabolic panel     Status: Abnormal   Collection Time: 03/21/16  2:54 AM  Result Value Ref Range   Sodium 135 135 - 145 mmol/L   Potassium 3.9 3.5 - 5.1 mmol/L   Chloride 107 101 - 111 mmol/L   CO2 22 22 - 32 mmol/L   Glucose, Bld 208 (H) 65 -  99 mg/dL   BUN 38 (H) 6 - 20 mg/dL   Creatinine, Ser 1.54 (H) 0.61 - 1.24 mg/dL   Calcium 8.3 (L) 8.9 - 10.3 mg/dL   GFR calc non Af Amer 44 (L) >60 mL/min   GFR calc Af Amer 51 (L) >60 mL/min   Anion gap 6 5 - 15  CBC with Differential/Platelet     Status: Abnormal   Collection Time: 03/21/16  2:54 AM  Result Value Ref Range   WBC 10.7 (H) 4.0 - 10.5 K/uL   RBC 3.09 (L) 4.22 - 5.81 MIL/uL   Hemoglobin 9.3 (L) 13.0 - 17.0 g/dL   HCT 28.5 (L) 39.0 - 52.0 %   MCV 92.2 78.0 - 100.0 fL   MCH 30.1 26.0 - 34.0 pg   MCHC 32.6 30.0 - 36.0 g/dL   RDW 14.1 11.5 - 15.5 %   Platelets 211 150 - 400 K/uL   Neutrophils Relative % 87 %   Lymphocytes Relative 9 %   Monocytes Relative 4 %   Eosinophils Relative 0 %   Basophils Relative 0 %   Neutro Abs 9.3 (H) 1.7 - 7.7 K/uL   Lymphs Abs 1.0 0.7 - 4.0 K/uL   Monocytes Absolute 0.4 0.1 - 1.0 K/uL    Eosinophils Absolute 0.0 0.0 - 0.7 K/uL   Basophils Absolute 0.0 0.0 - 0.1 K/uL   Smear Review MORPHOLOGY UNREMARKABLE   Glucose, capillary     Status: Abnormal   Collection Time: 03/21/16  4:02 AM  Result Value Ref Range   Glucose-Capillary 191 (H) 65 - 99 mg/dL   Comment 1 Notify RN     Intake/Output Summary (Last 24 hours) at 03/21/16 0828 Last data filed at 03/21/16 0800  Gross per 24 hour  Intake 2874.01 ml  Output   1950 ml  Net 924.01 ml    Telemetry: Atrial fibrillation. Occasional PVCs.  ASSESSMENT AND PLAN:  Principal Problem:   Septic shock ([place]) Active Problems:   CKD (chronic kidney disease), stage III   Edema leg   Diabetes mellitus type 2 in obese (HCC)   Elevated troponin   Anemia   Acute hypoxemic respiratory failure (HCC)   Altered mental status   Atrial fibrillation, new onset ([place])   Acute on chronic systolic and diastolic heart failure, NYHA class 1 ([place])   # Chronic systolic and diastolic heart failure: Mr. Ringger has known systolic dysfunction with an ejection fraction of 45% on echo in 2008. We do not have any additional echoes in our system and are currently awaiting his outside data. Echo this admission revealed an EF of 30-35% with focal wall motion abnormalities. He does not appear to be in heart failure at this time it is euvolemic on exam.  He is not requiring any pressors for hemodynamic support.    #  New-onset atrial fibrillation: Mr. Ty remains in atrial fibrillation. Rates are well controlled  without any nodal agents. He does have some pauses of approximately 1.5 seconds. We will continue to monitor this. Continue heparin for anticoagulation.  This patients CHA2DS2-VASc Score and unadjusted Ischemic Stroke Rate (% per year) is equal to 4.8 % stroke rate/year from a score of 4.  He will require long-term anticoagulation. However, we will wait to start oral anticoagulation until after he is also present and it is clear that he will not  require any procedures.   Above score calculated as 1 point each if present [CHF, HTN, DM, Vascular=MI/PAD/Aortic Plaque, Age if 12-74,  or Male] Above score calculated as 2 points each if present [Age > 75, or Stroke/TIA/TE]  # CAD: Mr. Leja has an extensive history of CAD with up to 16 prior stents.  Troponin was mildly elevated at 0.13.  EKG has not been concerning for active ischemia.  Continue home Plavix and heparin.  He is not on a beta blocker at baseline, presumably due to bradycardia or another allergy, as he has a reported allergy to atenolol  Will await outside records.  He is also not on a statin.  Presumably statin intolerant.  Consider PSK9 inhibitor as an outpatient. Will check lipids.   Time spent: 20 minutes-Greater than 50% of this time was spent in counseling, explanation of diagnosis, planning of further management, and coordination of care.    Madilyne Tadlock C. [name], MD, [facility] 03/21/2016 8:28 AM

## 2016-03-21 NOTE — Progress Notes (Signed)
EEG completed, results pending. 

## 2016-03-21 NOTE — Care Management Note (Signed)
Case Management Note  Patient Details  Name: Julian Ross MRN: [id number] Date of Birth: [birth year]  Subjective/Objective:     Pt admitted with septic shock               Action/Plan:  Pt intubated - information gathered from wife.  Pt was only slightly mobile at home prior to admit, wife reports that pt sleeps on average 20 hours a day.  CM will request PT evaluate pt post extubation.   Expected Discharge Date:                  Expected Discharge Plan:  [place]  In-House Referral:     Discharge planning Services  CM Consult  Post Acute Care Choice:    Choice offered to:     DME Arranged:    DME Agency:     HH Arranged:    [place] Agency:     Status of Service:  In process, will continue to follow  Medicare Important Message Given:    Date Medicare IM Given:    Medicare IM give by:    Date Additional Medicare IM Given:    Additional Medicare Important Message give by:     If discussed at [place] of Stay Meetings, dates discussed:    Additional Comments:  Maryclare Labrador, RN 03/21/2016, 2:06 PM

## 2016-03-21 NOTE — Consult Note (Signed)
NEURO HOSPITALIST CONSULT NOTE   Requestig physician: Dr. Nelda Marseille   Reason for Consult: seizure versus myoclonus   History obtained from:  Chart and wife  HPI:                                                                                                                                          Julian Ross is an 70 y.o. male who was admitted to hospital for progressive worsening of sob for the past 2-3 months with multiple falls. Workup reveled septic shock with multifocal pneumonia and new onset afib at controlled rate. He remained intubated since admission. He has been sedated until today. He has a known postural and resting fine tremor which has been worsening over time. As the sedation has been decreased he had been noted to have bilateral proximal jerking of both arms and legs.  The more he is stimulated or awake the more prominent this becomes. He had a EEG while under sedation which showed no epileptiform activity. Currently he will show the jerking motions when loud sound or tactile stimulation is given. Concern for question of seizures or just myoclonus.   Past Medical History  Diagnosis Date  . MI (myocardial infarction) ([place])   . Diabetes mellitus without complication ([place])   . Hypertension     Past Surgical History  Procedure Laterality Date  . Coronary angioplasty with stent placement      x 10  . Angioplasty      Family History  Problem Relation Age of Onset  . Heart failure Mother   . Hyperlipidemia Mother   . Hypertension Mother     Social History:  reports that he has never smoked. He does not have any smokeless tobacco history on file. He reports that he does not drink alcohol or use illicit drugs.  Allergies  Allergen Reactions  . Ace Inhibitors Cough  . Atenolol Other (See Comments)    Unknown reaction  . Contrast Media [Iodinated Diagnostic Agents] Rash  . Penicillins Hives and Rash    Has patient had a PCN reaction causing  immediate rash, facial/tongue/throat swelling, SOB or lightheadedness with hypotension: Yes Has patient had a PCN reaction causing severe rash involving mucus membranes or skin necrosis: No Has patient had a PCN reaction that required hospitalization pt was in the hospital at time of last reaction - heart attack Has patient had a PCN reaction occurring within the last 10 years: No If all of the above answers are "NO", then may proceed with Cephalosporin use.    MEDICATIONS:  Prior to Admission:  Prescriptions prior to admission  Medication Sig Dispense Refill Last Dose  . ALPRAZolam (NIRAVAM) 0.5 MG dissolvable tablet Take 0.5 mg by mouth at bedtime as needed for anxiety.   week ago  . aspirin EC 81 MG tablet Take 81 mg by mouth daily.   maybe 3/19  . BEE POLLEN PO Take 1 capsule by mouth daily.   maybe 3/19  . Cholecalciferol (VITAMIN D3) 5000 units CAPS Take 5,000 Units by mouth daily.  0 maybe 3/19  . clopidogrel (PLAVIX) 75 MG tablet Take 75 mg by mouth daily.   maybe 3/19  . Doxylamine Succinate, Sleep, (UNISOM PO) Take 1 tablet by mouth at bedtime as needed (sleep).   unknown  . folic acid (FOLVITE) 1 MG tablet Take 1 mg by mouth daily.   maybe 3/19  . furosemide (LASIX) 20 MG tablet Take 20 mg by mouth daily.   maybe 3/19  . gabapentin (NEURONTIN) 300 MG capsule Take 300 mg by mouth 3 (three) times daily.   maybe 3/19  . HYDROcodone-acetaminophen (NORCO/VICODIN) 5-325 MG tablet Take 1 tablet by mouth 3 (three) times daily as needed for moderate pain.   maybe 3/19  . insulin lispro protamine-lispro (HUMALOG 50/50 MIX) (50-50) 100 UNIT/ML SUSP injection Inject 40-50 Units into the skin 3 (three) times daily with meals. Inject 50 units subcutaneously with breakfast and 40 units with lunch & supper   maybe 3/19  . leucovorin (WELLCOVORIN) 10 MG tablet Take 10 mg by mouth See  admin instructions. Take 1 tablet (10 mg) by mouth every 12 hours and 24 hours after the methotrexate dose   maybe 3/19  . losartan (COZAAR) 50 MG tablet Take 50 mg by mouth daily.   maybe 3/19  . Methotrexate Sodium (METHOTREXATE, PF,) 50 MG/2ML injection Inject 15 mg into the muscle once a week. Inject 0.6 ml (15 mg) - on Fridays  0 maybe 3/17  . Misc Natural Products (OSTEO BI-FLEX ADV DOUBLE ST PO) Take 1 tablet by mouth 2 (two) times daily.   maybe 3/19  . omega-3 acid ethyl esters (LOVAZA) 1 g capsule Take 1 g by mouth 2 (two) times daily.   11 maybe 3/19  . OVER THE COUNTER MEDICATION Apply 1 application topically daily as needed (fungal infection from cpap). Over the counter topical cream   unknown  . OVER THE COUNTER MEDICATION Place 1 drop into both eyes daily as needed (dry eyes). Over the counter lubricating eye drop   unknown  . PARoxetine (PAXIL) 40 MG tablet Take 40 mg by mouth daily.    maybe 3/19  . potassium chloride SA (K-DUR,KLOR-CON) 20 MEQ tablet Take 20 mEq by mouth daily.  6 maybe 3/19  . predniSONE (DELTASONE) 5 MG tablet Take 5 mg by mouth daily with breakfast.   maybe 3/19  . PRESCRIPTION MEDICATION Inhale into the lungs See admin instructions. Use whenever sleeping   03/18/2016  . tamsulosin (FLOMAX) 0.4 MG CAPS capsule Take 0.4 mg by mouth daily.   4 maybe 3/19  . tiZANidine (ZANAFLEX) 4 MG tablet Take 4 mg by mouth 3 (three) times daily.  2 maybe 3/19  . topiramate (TOPAMAX) 25 MG tablet Take 25 mg by mouth 2 (two) times daily.  7 maybe 3/19  . traMADol (ULTRAM) 50 MG tablet Take 100 mg by mouth 3 (three) times daily as needed (pain).   2 maybe 3/19  . bimatoprost (LUMIGAN) 0.01 % SOLN Place 1 drop into both eyes at  bedtime.   unknown   Scheduled: . antiseptic oral rinse  7 mL Mouth Rinse QID  . aztreonam  2 g Intravenous 3 times per day  . chlorhexidine gluconate  15 mL Mouth Rinse BID  . Chlorhexidine Gluconate Cloth  6 each Topical Q0600  . clopidogrel  75 mg Per  Tube Daily  . famotidine  20 mg Per Tube Daily  . feeding supplement (PRO-STAT SUGAR FREE 64)  60 mL Per Tube BID  . folic acid  1 mg Oral Daily  . gabapentin  300 mg Oral TID  . guaiFENesin  15 mL Per Tube 4 times per day  . hydrocortisone sod succinate (SOLU-CORTEF) inj  25 mg Intravenous Q12H  . insulin aspart  0-20 Units Subcutaneous 6 times per day  . ipratropium-albuterol  3 mL Nebulization Q6H  . mupirocin ointment  1 application Nasal BID  . PARoxetine  40 mg Oral Daily  . topiramate  25 mg Oral BID     ROS:                                                                                                                                       History obtained from wife and PCCMD. The patient is comatose.   Blood pressure 105/74, pulse 100, temperature 98.5 F (36.9 C), temperature source Oral, resp. rate 16, height 5\' 11"  (1.803 m), weight 106.9 kg (235 lb 10.8 oz), SpO2 100 %.   Neurologic Examination:                                                                                                      HEENT-  Normocephalic, no lesions, without obvious abnormality.  Normal external eye and conjunctiva.    Cardiovascular- irregularly irregular rhythm, pulses palpable throughout   Lungs- chest clear, no wheezing, rales, normal symmetric air entry Abdomen- normal findings: bowel sounds normal Extremities- no edema Lymph-no adenopathy palpable Musculoskeletal-no joint tenderness, deformity or swelling Skin-warm and dry, no hyperpigmentation, vitiligo, or suspicious lesions  Neurological Examination Mental Status: Intubated, will open eyes when name called. Attempts to follow commands but still under sedation.  Cranial Nerves: II: intermittent blink to threat, pupils equal, round, reactive to light and accommodation III,IV, VI:  extra-ocular motions difficult to assess due to drowsiness but doll's intact V,VII: face symmetric, facial light touch sensation normal  bilaterally VIII: hearing intact to loud stimuli with eye opening response IX,X: unable to visualize XI: unable to assess due to sedation.  [id number]  to visualize Motor: Increased tone when passively moved, when hands clapped or tactile simulation given he will have rhythmic jerking of shoulder girdle> legs. This will last for a couple seconds and then stop.  Sensory: No withdrawal from pain.  Deep Tendon Reflexes: 1+ and symmetric throughout Plantars: Mute bilaterally   Lab Results: Basic Metabolic Panel:  Recent Labs Lab 03/18/16 2034 03/19/16 0338 03/19/16 0622 03/19/16 1545 03/20/16 0350 03/21/16 0254  NA  --  136 136 137 137 135  K  --  4.3 4.2 4.6 4.4 3.9  CL  --  103 103 104 105 107  CO2  --  21* 18* 18* 17* 22  GLUCOSE  --  130* 135* 111* 224* 208*  BUN  --  22* 23* 22* 26* 38*  CREATININE  --  2.09* 2.07* 1.80* 1.81* 1.54*  CALCIUM  --  8.3* 8.2* 8.4* 8.2* 8.3*  MG 2.0  --  1.8  --  2.2  --   PHOS  --   --  3.3  --   --   --     Liver Function Tests:  Recent Labs Lab 03/18/16 1607 03/19/16 0338 03/20/16 0350  AST 37 76* 52*  ALT 19 27 26   ALKPHOS 42 40 37*  BILITOT 0.7 0.7 0.8  PROT 6.0* 6.0* 5.5*  ALBUMIN 2.8* 2.8* 2.3*   No results for input(s): LIPASE, AMYLASE in the last 168 hours. No results for input(s): AMMONIA in the last 168 hours.  CBC:  Recent Labs Lab 03/18/16 1607 03/18/16 1621 03/19/16 0338 03/19/16 0622 03/20/16 0350 03/21/16 0254  WBC 16.7*  --  16.8* 18.3* 10.7* 10.7*  NEUTROABS 14.8*  --  14.5*  --  9.8* 9.3*  HGB 10.2* 11.2* 10.1* 10.4* 9.3* 9.3*  HCT 30.8* 33.0* 30.3* 30.8* 28.2* 28.5*  MCV 91.4  --  92.7 93.3 94.3 92.2  PLT 192  --  195 198 178 211    Cardiac Enzymes:  Recent Labs Lab 03/18/16 2034 03/19/16 0010 03/19/16 0217  TROPONINI 0.13* 0.11* 0.10*    Lipid Panel: No results for input(s): CHOL, TRIG, HDL, CHOLHDL, VLDL, LDLCALC in the last 168 hours.  CBG:  Recent Labs Lab 03/20/16 2015  03/20/16 2354 03/21/16 0402 03/21/16 0750 03/21/16 1206  GLUCAP 277* 266* 191* 198* 214*    Microbiology: Results for orders placed or performed during the hospital encounter of 03/18/16  Blood Culture (routine x 2)     Status: None (Preliminary result)   Collection Time: 03/18/16  4:45 PM  Result Value Ref Range Status   Specimen Description BLOOD LEFT ANTECUBITAL  Final   Special Requests BOTTLES DRAWN AEROBIC AND ANAEROBIC 5ML  Final   Culture NO GROWTH 2 DAYS  Final   Report Status PENDING  Incomplete  Blood Culture (routine x 2)     Status: None (Preliminary result)   Collection Time: 03/18/16  5:15 PM  Result Value Ref Range Status   Specimen Description BLOOD LEFT WRIST  Final   Special Requests BOTTLES DRAWN AEROBIC AND ANAEROBIC 5ML  Final   Culture NO GROWTH 2 DAYS  Final   Report Status PENDING  Incomplete  Respiratory virus antigens panel     Status: None   Collection Time: 03/18/16  7:46 PM  Result Value Ref Range Status   Source - RVPAN NASAL SWAB  Corrected   Respiratory Syncytial Virus A Negative Negative Final   Respiratory Syncytial Virus B Negative Negative Final   Influenza A Negative Negative Final  Influenza B Negative Negative Final   Parainfluenza 1 Negative Negative Final   Parainfluenza 2 Negative Negative Final   Parainfluenza 3 Negative Negative Final   Metapneumovirus Negative Negative Final   Rhinovirus Negative Negative Final   Adenovirus Negative Negative Final    Comment: (NOTE) Performed At: [facility] [address] [place], [place] [id number] Lindon Romp MD [id number]   Gram stain     Status: None   Collection Time: 03/19/16  9:05 AM  Result Value Ref Range Status   Specimen Description TRACHEAL ASPIRATE  Final   Special Requests NONE  Final   Gram Stain   Final    ABUNDANT WBC PRESENT, PREDOMINANTLY PMN NO ORGANISMS SEEN    Report Status 03/19/2016 FINAL  Final  Culture, respiratory (NON-Expectorated)      Status: None   Collection Time: 03/19/16  9:05 AM  Result Value Ref Range Status   Specimen Description TRACHEAL ASPIRATE  Final   Special Requests Normal  Final   Gram Stain   Final    ABUNDANT WBC PRESENT,BOTH PMN AND MONONUCLEAR NO SQUAMOUS EPITHELIAL CELLS SEEN NO ORGANISMS SEEN Performed at [facility] Performed at [facility]    Culture   Final    NO GROWTH 2 DAYS Performed at [facility]    Report Status 03/21/2016 FINAL  Final  Respiratory virus panel     Status: None   Collection Time: 03/19/16  9:05 AM  Result Value Ref Range Status   Source - RVPAN TRACHEAL ASPIRATE  Corrected   Respiratory Syncytial Virus A Negative Negative Final   Respiratory Syncytial Virus B Negative Negative Final   Influenza A Negative Negative Final   Influenza B Negative Negative Final   Parainfluenza 1 Negative Negative Final   Parainfluenza 2 Negative Negative Final   Parainfluenza 3 Negative Negative Final   Metapneumovirus Negative Negative Final   Rhinovirus Negative Negative Final   Adenovirus Negative Negative Final    Comment: (NOTE) Performed At: [facility] [place], [place] [id number] Lindon Romp MD [id number]   MRSA PCR Screening     Status: Abnormal   Collection Time: 03/19/16  3:13 PM  Result Value Ref Range Status   MRSA by PCR POSITIVE (A) NEGATIVE Final    Comment:        The GeneXpert MRSA Assay (FDA approved for NASAL specimens only), is one component of a comprehensive MRSA colonization surveillance program. It is not intended to diagnose MRSA infection nor to guide or monitor treatment for MRSA infections. RESULT CALLED TO, READ BACK BY AND VERIFIED WITH: L.WILSON,RN AT 1850 BY L.PITT     Coagulation Studies:  Recent Labs  03/18/16 1607  LABPROT 16.2*  INR 1.29    Imaging: Dg Chest Port 1 View  03/21/2016  CLINICAL DATA:  Respiratory failure.  Hypoxia. EXAM: PORTABLE CHEST 1 VIEW  COMPARISON:  Single-view of the chest 03/18/2016, 03/19/2016 and 03/20/2016. FINDINGS: ET tube and NG tube remain in place. Bilateral airspace disease is worse on the left but is improved. There is no pneumothorax or pleural effusion. Heart size is normal. IMPRESSION: Improved left worse than right airspace disease. No new abnormality. Electronically Signed   By: Inge Rise M.D.   On: 03/21/2016 07:17   Dg Chest Port 1 View  03/20/2016  CLINICAL DATA:  Acute respiratory failure with hypoxemia. EXAM: PORTABLE CHEST 1 VIEW COMPARISON:  Chest x-rays dated 03/19/2016 and 03/18/2016. FINDINGS: Endotracheal tube remains adequately positioned  with tip just above the level of the carina. Enteric tube passes below the diaphragm. Cardiomediastinal silhouette appears stable in size and configuration. There is persistent central pulmonary vascular congestion and patchy bilateral airspace opacities which are most suggestive of pulmonary edema pattern. Perhaps mildly improved aeration within the left lung. No pneumothorax seen. IMPRESSION: Patchy bilateral airspace opacities, multifocal pneumonia versus pulmonary edema, not significantly changed in overall extent, perhaps slightly improved aeration within the left lung. Favor pulmonary edema. ARDS is another consideration. Electronically Signed   By: Franki Cabot M.D.   On: 03/20/2016 13:20   Assessment and plan discussed with attending neurologist  Etta Quill PA-C [facility] [phone] 03/21/2016, 12:52 PM   Assessment/Plan: 1. Intermittent rhythmic proximal jerking noted with stimulation (both tactile and auditory) in a patient with sepsis and PNA. 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 Will repeat EEG in hopes to capture a few episodes. Would start IV Keppra 1000 mg BID, as this can treat post-anoxic myoclonus as well as seizures.  2. Will check ammonia.  3. Interpretation of follow up EEG is pending.     Kerney Elbe, MD

## 2016-03-21 NOTE — Progress Notes (Signed)
ANTICOAGULATION CONSULT NOTE - Follow Up Consult  Pharmacy Consult for heparin  Indication: atrial fibrillation  Allergies  Allergen Reactions  . Ace Inhibitors Cough  . Atenolol Other (See Comments)    Unknown reaction  . Contrast Media [Iodinated Diagnostic Agents] Rash  . Penicillins Hives and Rash    Has patient had a PCN reaction causing immediate rash, facial/tongue/throat swelling, SOB or lightheadedness with hypotension: Yes Has patient had a PCN reaction causing severe rash involving mucus membranes or skin necrosis: No Has patient had a PCN reaction that required hospitalization pt was in the hospital at time of last reaction - heart attack Has patient had a PCN reaction occurring within the last 10 years: No If all of the above answers are "NO", then may proceed with Cephalosporin use.    Patient Measurements: Height: 5\' 11"  (180.3 cm) Weight: 235 lb 10.8 oz (106.9 kg) IBW/kg (Calculated) : 75.3 Heparin Dosing Weight: 98.5 kg  Vital Signs: Temp: 98.5 F (36.9 C) (03/22 0753) Temp Source: Oral (03/22 0753) BP: 105/74 mmHg (03/22 1100) Pulse Rate: 100 (03/22 1100)  Labs:  Recent Labs  03/18/16 1607  03/18/16 2034 03/19/16 0010 03/19/16 0217  03/19/16 0622 03/19/16 1545 03/20/16 0350 03/20/16 1800 03/21/16 0250 03/21/16 0254 03/21/16 1101  HGB 10.2*  < >  --   --   --   < > 10.4*  --  9.3*  --   --  9.3*  --   HCT 30.8*  < >  --   --   --   < > 30.8*  --  28.2*  --   --  28.5*  --   PLT 192  --   --   --   --   < > 198  --  178  --   --  211  --   APTT 33  --   --   --   --   --   --   --   --   --   --   --   --   LABPROT 16.2*  --   --   --   --   --   --   --   --   --   --   --   --   INR 1.29  --   --   --   --   --   --   --   --   --   --   --   --   HEPARINUNFRC  --   --   --   --   --   --   --   --   --  0.28* 0.34  --  0.33  CREATININE 2.13*  < >  --   --   --   < > 2.07* 1.80* 1.81*  --   --  1.54*  --   TROPONINI  --   --  0.13* 0.11* 0.10*   --   --   --   --   --   --   --   --   < > = values in this interval not displayed.  Estimated Creatinine Clearance: 56.3 mL/min (by C-G formula based on Cr of 1.54).  Assessment: 70 yo M on heparin for afib. Heparin level therapeutic (0.33) on gtt at 1550 units/hr. CBC stable. No bleeding noted.  Goal of Therapy:  Heparin level 0.3-0.7 units/ml Monitor platelets by anticoagulation protocol: Yes   Plan:  Increase heparin infusion slightly to 1650 units/hr to keep in therapeutic range Check anti-Xa daily while on heparin Continue to monitor H&H and platelets  Thank you for allowing [place] to participate in this patients care. Jens Som, PharmD Pager: [phone]  03/21/2016 12:32 PM

## 2016-03-21 NOTE — Procedures (Cosign Needed)
ELECTROENCEPHALOGRAM REPORT  Date of Study: 03/21/2016  Patient's Name: Julian Ross MRN: [id number] Date of Birth: Jan 01, 1946  Referring Provider: Etta Quill, PA-C  Indication: 70 year old male intubated and sedated for septic shock.  He exhibits intermittent jerking movements.  Medications: acetaminophen (TYLENOL) tablet 650 mg chlorhexidine gluconate (PERIDEX) 0.12 % solution 15 mL clopidogrel (PLAVIX) tablet 75 mg dexmedetomidine (PRECEDEX) fentaNYL (SUBLIMAZE) gabapentin (NEURONTIN) capsule 300 mg hydrocortisone sodium succinate (SOLU-CORTEF) 100 MG injection 25 mg insulin aspart (novoLOG) injection 0-20 Units ipratropium-albuterol (DUONEB) 0.5-2.5 (3) MG/3ML nebulizer solution 3 mL LORazepam (ATIVAN) injection 2 mg midazolam (VERSED) injection 1 mg PARoxetine (PAXIL) tablet 40 mg topiramate (TOPAMAX) tablet 25 mg  Technical Summary: This is a multichannel digital EEG recording, using the international 10-20 placement system with electrodes applied with paste and impedances below 5000 ohms.    Description: The EEG background is symmetric with diffuse low-amplitude mixed theta and delta slowing, with no discernible posterior dominant rhythm.  Several of the patient's habitual movements, as demonstrated by intermittent jerks and shaking, were captured and with no electrographic correlate.  No focal or generalized epileptiform discharges are seen.  Stage II sleep is not seen.  Hyperventilation and photic stimulation were not performed.  ECG revealed normal cardiac rate and rhythm.  Impression: This is an abnormal EEG of the sedated state due to generalized background slowing.  This is indicative of diffuse cerebral dysfunction, which may be due to toxic-metabolic, infectious, hypoxic, pharmacologic or other diffuse physiologic abnormality or etiology.  The patient's habitual movements are non-epileptic.  Adam R. Tomi Likens, DO

## 2016-03-22 DIAGNOSIS — A419 Sepsis, unspecified organism: Principal | ICD-10-CM

## 2016-03-22 DIAGNOSIS — R6521 Severe sepsis with septic shock: Secondary | ICD-10-CM

## 2016-03-22 LAB — CBC
HEMATOCRIT: 30.1 % — AB (ref 39.0–52.0)
Hemoglobin: 10.1 g/dL — ABNORMAL LOW (ref 13.0–17.0)
MCH: 30.8 pg (ref 26.0–34.0)
MCHC: 33.6 g/dL (ref 30.0–36.0)
MCV: 91.8 fL (ref 78.0–100.0)
Platelets: 256 10*3/uL (ref 150–400)
RBC: 3.28 MIL/uL — ABNORMAL LOW (ref 4.22–5.81)
RDW: 14.1 % (ref 11.5–15.5)
WBC: 9.2 10*3/uL (ref 4.0–10.5)

## 2016-03-22 LAB — GLUCOSE, CAPILLARY
GLUCOSE-CAPILLARY: 194 mg/dL — AB (ref 65–99)
GLUCOSE-CAPILLARY: 215 mg/dL — AB (ref 65–99)
Glucose-Capillary: 190 mg/dL — ABNORMAL HIGH (ref 65–99)
Glucose-Capillary: 219 mg/dL — ABNORMAL HIGH (ref 65–99)
Glucose-Capillary: 251 mg/dL — ABNORMAL HIGH (ref 65–99)
Glucose-Capillary: 78 mg/dL (ref 65–99)

## 2016-03-22 LAB — POCT I-STAT 3, ART BLOOD GAS (G3+)
BICARBONATE: 25.2 meq/L — AB (ref 20.0–24.0)
O2 Saturation: 92 %
PH ART: 7.364 (ref 7.350–7.450)
PO2 ART: 67 mmHg — AB (ref 80.0–100.0)
TCO2: 27 mmol/L (ref 0–100)
pCO2 arterial: 44.4 mmHg (ref 35.0–45.0)

## 2016-03-22 LAB — HEPARIN LEVEL (UNFRACTIONATED)
HEPARIN UNFRACTIONATED: 0.38 [IU]/mL (ref 0.30–0.70)
Heparin Unfractionated: 0.22 IU/mL — ABNORMAL LOW (ref 0.30–0.70)
Heparin Unfractionated: 0.48 IU/mL (ref 0.30–0.70)

## 2016-03-22 LAB — BASIC METABOLIC PANEL
ANION GAP: 10 (ref 5–15)
BUN: 41 mg/dL — ABNORMAL HIGH (ref 6–20)
CHLORIDE: 110 mmol/L (ref 101–111)
CO2: 24 mmol/L (ref 22–32)
Calcium: 8.4 mg/dL — ABNORMAL LOW (ref 8.9–10.3)
Creatinine, Ser: 1.36 mg/dL — ABNORMAL HIGH (ref 0.61–1.24)
GFR calc Af Amer: 60 mL/min — ABNORMAL LOW (ref >60)
GFR, EST NON AFRICAN AMERICAN: 52 mL/min — AB (ref >60)
GLUCOSE: 231 mg/dL — AB (ref 65–99)
POTASSIUM: 3.5 mmol/L (ref 3.5–5.1)
Sodium: 144 mmol/L (ref 135–145)

## 2016-03-22 LAB — LIPID PANEL
CHOLESTEROL: 123 mg/dL (ref 0–200)
HDL: 25 mg/dL — AB (ref >40)
LDL CALC: 67 mg/dL (ref 0–99)
TRIGLYCERIDES: 154 mg/dL — AB (ref <150)
Total CHOL/HDL Ratio: 4.9 RATIO
VLDL: 31 mg/dL (ref 0–40)

## 2016-03-22 LAB — MAGNESIUM: MAGNESIUM: 2.1 mg/dL (ref 1.7–2.4)

## 2016-03-22 MED ORDER — HYDRALAZINE HCL 20 MG/ML IJ SOLN
10.0000 mg | INTRAMUSCULAR | Status: DC | PRN
  Administered 2016-03-27: 20 mg via INTRAVENOUS
  Filled 2016-03-22: qty 1

## 2016-03-22 MED ORDER — LOSARTAN POTASSIUM 50 MG PO TABS
50.0000 mg | ORAL_TABLET | Freq: Every day | ORAL | Status: DC
  Administered 2016-03-23 – 2016-03-26 (×4): 50 mg via ORAL
  Filled 2016-03-22 (×9): qty 1

## 2016-03-22 MED ORDER — FENTANYL CITRATE (PF) 100 MCG/2ML IJ SOLN
INTRAMUSCULAR | Status: AC
  Administered 2016-03-22: 200 ug
  Filled 2016-03-22: qty 4

## 2016-03-22 MED ORDER — FENTANYL CITRATE (PF) 100 MCG/2ML IJ SOLN: 12.5000 ug | INTRAMUSCULAR | Status: DC | PRN

## 2016-03-22 MED ORDER — LIDOCAINE HCL (CARDIAC) 20 MG/ML IV SOLN
INTRAVENOUS | Status: AC
  Filled 2016-03-22: qty 5

## 2016-03-22 MED ORDER — DILTIAZEM LOAD VIA INFUSION
35.0000 mg | Freq: Once | INTRAVENOUS | Status: AC
  Administered 2016-03-22: 35 mg via INTRAVENOUS
  Filled 2016-03-22: qty 35

## 2016-03-22 MED ORDER — CETYLPYRIDINIUM CHLORIDE 0.05 % MT LIQD
7.0000 mL | Freq: Two times a day (BID) | OROMUCOSAL | Status: DC
  Administered 2016-03-22 – 2016-03-23 (×3): 7 mL via OROMUCOSAL

## 2016-03-22 MED ORDER — DILTIAZEM HCL 100 MG IV SOLR
5.0000 mg/h | INTRAVENOUS | Status: DC
  Administered 2016-03-22: 5 mg/h via INTRAVENOUS
  Administered 2016-03-22: 15 mg/h via INTRAVENOUS
  Administered 2016-03-23: 10 mg/h via INTRAVENOUS
  Administered 2016-03-24: 5 mg/h via INTRAVENOUS
  Administered 2016-03-25: 3 mg/h via INTRAVENOUS
  Filled 2016-03-22 (×5): qty 100

## 2016-03-22 MED ORDER — POTASSIUM CHLORIDE 10 MEQ/100ML IV SOLN
10.0000 meq | INTRAVENOUS | Status: AC
  Administered 2016-03-22 (×2): 10 meq via INTRAVENOUS
  Filled 2016-03-22 (×3): qty 100

## 2016-03-22 MED ORDER — PREDNISONE 10 MG PO TABS
10.0000 mg | ORAL_TABLET | Freq: Every day | ORAL | Status: DC
  Administered 2016-03-23 – 2016-03-26 (×4): 10 mg via ORAL
  Filled 2016-03-22 (×4): qty 1

## 2016-03-22 MED ORDER — METOPROLOL TARTRATE 1 MG/ML IV SOLN
2.5000 mg | Freq: Four times a day (QID) | INTRAVENOUS | Status: DC
  Administered 2016-03-22 (×2): 2.5 mg via INTRAVENOUS
  Filled 2016-03-22 (×4): qty 5

## 2016-03-22 MED ORDER — METOPROLOL TARTRATE 1 MG/ML IV SOLN
2.5000 mg | Freq: Once | INTRAVENOUS | Status: AC
  Administered 2016-03-22: 2.5 mg via INTRAVENOUS

## 2016-03-22 MED ORDER — PROPOFOL 1000 MG/100ML IV EMUL
INTRAVENOUS | Status: DC
Start: 2016-03-22 — End: 2016-03-23
  Filled 2016-03-22: qty 100

## 2016-03-22 MED ORDER — CHLORHEXIDINE GLUCONATE 0.12 % MT SOLN
15.0000 mL | Freq: Two times a day (BID) | OROMUCOSAL | Status: DC
  Administered 2016-03-22 – 2016-03-23 (×3): 15 mL via OROMUCOSAL

## 2016-03-22 MED ORDER — DILTIAZEM LOAD VIA INFUSION
25.0000 mg | Freq: Once | INTRAVENOUS | Status: AC
  Administered 2016-03-22: 25 mg via INTRAVENOUS
  Filled 2016-03-22: qty 25

## 2016-03-22 MED ORDER — MIDAZOLAM HCL 2 MG/2ML IJ SOLN
INTRAMUSCULAR | Status: AC
  Administered 2016-03-22: 4 mg
  Filled 2016-03-22: qty 4

## 2016-03-22 NOTE — Progress Notes (Signed)
Reviewed EEG and reading. 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 No AED recommended at this time. As recommended earlier, if motions become bothersome to family may consider Keppra 1000 mg BID to decrease activity. Please call with further question.   Etta Quill PA-C [facility] [phone]  03/22/2016, 8:58 AM

## 2016-03-22 NOTE — Progress Notes (Signed)
eLink Physician-Brief Progress Note Patient Name: Julian Ross DOB: Oct 09, 1946 MRN: [id number]   Date of Service  03/22/2016  HPI/Events of Note  Nurse notified of altered mentation now and slightly worsening respiratory status with rapid ventricular response. Can recheck on patient shows eyes are open and he is somewhat responsive although slightly confused. Currently on heparin infusion. Atrial fibrillation with rapid ventricular response not responding to IV Lopressor.  eICU Interventions  1. Start diltiazem infusion after bolus 2. ABG stat 3. Holding on head CT for now pending results and further stabilization.     Intervention Category Major Interventions: Arrhythmia - evaluation and management  Tera Partridge 03/22/2016, 8:35 PM

## 2016-03-22 NOTE — Procedures (Signed)
Extubation Procedure Note  Patient Details:   Name: Taran Loveday DOB: 08/15/1946 MRN: [id number]   Airway Documentation:     Evaluation  O2 sats: stable throughout Complications: No apparent complications Patient did tolerate procedure well. Bilateral Breath Sounds: Clear Suctioning: Oral, Airway Yes   Patient extubated to 2L nasal cannula per MD order.  Positive cuff leak noted.  No evidence of stridor.  Patient able to speak post extubation.  Sats currently 98%.  Vitals are stable.  Incentive spirometry performed with achieved goal of 500.  No apparent complications.    Philomena Doheny 03/22/2016, 2:46 PM

## 2016-03-22 NOTE — Progress Notes (Signed)
350 ml of fentanyl wasted. Witnessed by [employer], RN and [employer], Therapist, sports

## 2016-03-22 NOTE — Progress Notes (Signed)
RN reports that since pt was placed on SBT this morning pt has had several episodes of PVC's.  Placed pt back on full vent support per RN request.  PT appeared to be tolerating vent wean well w/ RR 15 bpm, spont vT 600's and no distress noted.

## 2016-03-22 NOTE — Progress Notes (Signed)
ANTICOAGULATION CONSULT NOTE - Follow Up Consult  Pharmacy Consult for heparin  Indication: atrial fibrillation  Allergies  Allergen Reactions  . Ace Inhibitors Cough  . Atenolol Other (See Comments)    Unknown reaction  . Contrast Media [Iodinated Diagnostic Agents] Rash  . Penicillins Hives and Rash    Has patient had a PCN reaction causing immediate rash, facial/tongue/throat swelling, SOB or lightheadedness with hypotension: Yes Has patient had a PCN reaction causing severe rash involving mucus membranes or skin necrosis: No Has patient had a PCN reaction that required hospitalization pt was in the hospital at time of last reaction - heart attack Has patient had a PCN reaction occurring within the last 10 years: No If all of the above answers are "NO", then may proceed with Cephalosporin use.    Patient Measurements: Height: 5\' 11"  (180.3 cm) Weight: 232 lb 2.3 oz (105.3 kg) IBW/kg (Calculated) : 75.3 Heparin Dosing Weight: 98.5 kg  Vital Signs: Temp: 98.9 F (37.2 C) (03/23 1527) Temp Source: Oral (03/23 1527) BP: 146/102 mmHg (03/23 1800) Pulse Rate: 124 (03/23 1800)  Labs:  Recent Labs  03/20/16 0350  03/21/16 0254  03/22/16 0320 03/22/16 0835 03/22/16 1023 03/22/16 1550  HGB 9.3*  --  9.3*  --  10.1*  --   --   --   HCT 28.2*  --  28.5*  --  30.1*  --   --   --   PLT 178  --  211  --  256  --   --   --   HEPARINUNFRC  --   < >  --   < > 0.22*  --  0.38 0.48  CREATININE 1.81*  --  1.54*  --   --  1.36*  --   --   < > = values in this interval not displayed.  Estimated Creatinine Clearance: 63.3 mL/min (by C-G formula based on Cr of 1.36).  Assessment: 70 yo M on heparin for afib. Heparin level therapeutic 0.38 this am on gtt at 1850 units/hr. Recheck this afternoon at same rate 0.48.  CBC stable. No issues with line or bleeding reported per RN.  Goal of Therapy:  Heparin level 0.3-0.7 units/ml Monitor platelets by anticoagulation protocol: Yes   Plan:   Heparin infusion at 1850 units/hr Daily HL/CBC Monitor s/sx bleeding   Bonnita Nasuti Pharm.D. CPP, BCPS Clinical Pharmacist [phone] 03/22/2016 6:56 PM

## 2016-03-22 NOTE — Progress Notes (Signed)
ANTICOAGULATION CONSULT NOTE - Follow Up Consult  Pharmacy Consult for heparin  Indication: atrial fibrillation  Allergies  Allergen Reactions  . Ace Inhibitors Cough  . Atenolol Other (See Comments)    Unknown reaction  . Contrast Media [Iodinated Diagnostic Agents] Rash  . Penicillins Hives and Rash    Has patient had a PCN reaction causing immediate rash, facial/tongue/throat swelling, SOB or lightheadedness with hypotension: Yes Has patient had a PCN reaction causing severe rash involving mucus membranes or skin necrosis: No Has patient had a PCN reaction that required hospitalization pt was in the hospital at time of last reaction - heart attack Has patient had a PCN reaction occurring within the last 10 years: No If all of the above answers are "NO", then may proceed with Cephalosporin use.    Patient Measurements: Height: 5\' 11"  (180.3 cm) Weight: 232 lb 2.3 oz (105.3 kg) IBW/kg (Calculated) : 75.3 Heparin Dosing Weight: 98.5 kg  Vital Signs: Temp: 98.9 F (37.2 C) (03/23 0841) Temp Source: Oral (03/23 0841) BP: 148/88 mmHg (03/23 1100) Pulse Rate: 95 (03/23 1100)  Labs:  Recent Labs  03/20/16 0350  03/21/16 0254 03/21/16 1101 03/22/16 0320 03/22/16 0835 03/22/16 1023  HGB 9.3*  --  9.3*  --  10.1*  --   --   HCT 28.2*  --  28.5*  --  30.1*  --   --   PLT 178  --  211  --  256  --   --   HEPARINUNFRC  --   < >  --  0.33 0.22*  --  0.38  CREATININE 1.81*  --  1.54*  --   --  1.36*  --   < > = values in this interval not displayed.  Estimated Creatinine Clearance: 63.3 mL/min (by C-G formula based on Cr of 1.36).  Assessment: 70 yo M on heparin for afib. Heparin level therapeutic x1 (0.38) on gtt at 1850 units/hr. CBC stable. No issues with line or bleeding reported per RN.  Goal of Therapy:  Heparin level 0.3-0.7 units/ml Monitor platelets by anticoagulation protocol: Yes   Plan:  Heparin infusion at 1850 units/hr F/u 6 hr heparin level to  confirm Daily HL/CBC Monitor s/sx bleeding  Elicia Lamp, PharmD, [facility] Clinical Pharmacist Pager [phone] 03/22/2016 11:24 AM

## 2016-03-22 NOTE — Progress Notes (Signed)
Called ELink regarding pts heart rate increasing into the 130's-140's. Orders received. Will continue to monitor closely.

## 2016-03-22 NOTE — Progress Notes (Signed)
eLink Physician-Brief Progress Note Patient Name: Julian Ross DOB: Oct 21, 1946 MRN: [id number]   Date of Service  03/22/2016  HPI/Events of Note  Bedside nurse notified of worsening rapid ventricular response despite Lopressor 2.5 mg IV given. Serum potassium 3.5 this morning. Patient has been in atrial fibrillation since admission. Hypertensive at this time with BP 146/102. Respiratory rate 20 & saturation 92%.  eICU Interventions  1. Lopressor 2.5 mg IV 1 now 2. Continue to monitor on telemetry 3. Potassium chloride 10 mEq IV 4 runs 4. Stat serum magnesium level 5. Consider initiation of diltiazem infusion if heart rate unable to be controlled with intermittent Lopressor.     Intervention Category Intermediate Interventions: Arrhythmia - evaluation and management  Tera Partridge 03/22/2016, 6:27 PM

## 2016-03-22 NOTE — Progress Notes (Signed)
ANTICOAGULATION CONSULT NOTE - Follow Up Consult  Pharmacy Consult for heparin  Indication: atrial fibrillation  Allergies  Allergen Reactions  . Ace Inhibitors Cough  . Atenolol Other (See Comments)    Unknown reaction  . Contrast Media [Iodinated Diagnostic Agents] Rash  . Penicillins Hives and Rash    Has patient had a PCN reaction causing immediate rash, facial/tongue/throat swelling, SOB or lightheadedness with hypotension: Yes Has patient had a PCN reaction causing severe rash involving mucus membranes or skin necrosis: No Has patient had a PCN reaction that required hospitalization pt was in the hospital at time of last reaction - heart attack Has patient had a PCN reaction occurring within the last 10 years: No If all of the above answers are "NO", then may proceed with Cephalosporin use.    Patient Measurements: Height: 5\' 11"  (180.3 cm) Weight: 232 lb 2.3 oz (105.3 kg) IBW/kg (Calculated) : 75.3 Heparin Dosing Weight: 98.5 kg  Vital Signs: Temp: 98.7 F (37.1 C) (03/23 0332) Temp Source: Oral (03/23 0332) BP: 138/83 mmHg (03/23 0300) Pulse Rate: 91 (03/23 0300)  Labs:  Recent Labs  03/19/16 1545 03/20/16 0350  03/21/16 0250 03/21/16 0254 03/21/16 1101 03/22/16 0320  HGB  --  9.3*  --   --  9.3*  --  10.1*  HCT  --  28.2*  --   --  28.5*  --  30.1*  PLT  --  178  --   --  211  --  256  HEPARINUNFRC  --   --   < > 0.34  --  0.33 0.22*  CREATININE 1.80* 1.81*  --   --  1.54*  --   --   < > = values in this interval not displayed.  Estimated Creatinine Clearance: 55.9 mL/min (by C-G formula based on Cr of 1.54).  Assessment: 70 yo M on heparin for afib. Heparin level trending down to subtherapeutic on gtt at 1650 units/hr. CBC stable. No issues with line or bleeding reported per RN.  Goal of Therapy:  Heparin level 0.3-0.7 units/ml Monitor platelets by anticoagulation protocol: Yes   Plan:  Increase heparin infusion to 1850 units/hr F/u 6 hr heparin  level  Sherlon Handing, PharmD, BCPS Clinical pharmacist, pager [phone] 03/22/2016 4:33 AM

## 2016-03-22 NOTE — Progress Notes (Signed)
PULMONARY / CRITICAL CARE MEDICINE   Name: Julian Ross MRN: [id number] DOB: 1946/07/01    ADMISSION DATE:  03/18/2016 CONSULTATION DATE:  03/19/16  REFERRING MD:  Fuller Plan  CHIEF COMPLAINT:  Shortness of breath, confusion  SUBJECTIVE:   Neurology saw him yesterday, felt that his jerking was due to myoclonus, repeat EEG negative PVC's on PSV this morning   VITAL SIGNS: BP 148/88 mmHg  Pulse 95  Temp(Src) 98.9 F (37.2 C) (Oral)  Resp 20  Ht 5\' 11"  (1.803 m)  Wt 105.3 kg (232 lb 2.3 oz)  BMI 32.39 kg/m2  SpO2 100%  HEMODYNAMICS:    VENTILATOR SETTINGS: Vent Mode:  [-] PRVC FiO2 (%):  [40 %] 40 % Set Rate:  [20 bmp] 20 bmp Vt Set:  [600 mL] 600 mL PEEP:  [5 cmH20] 5 cmH20 Pressure Support:  [5 cmH20] 5 cmH20 Plateau Pressure:  [18 cmH20-24 cmH20] 18 cmH20  INTAKE / OUTPUT: I/O last 3 completed shifts: In: 4086.1 [I.V.:1596.1; NG/GT:2020; IV Piggyback:470] Out: 7095 [Urine:7095]  PHYSICAL EXAMINATION: General: awakens to voice Neuro: wakes up, focuses on me, nods head, answers questions, no tremor today CV: irreg irreg , no mgr PULM: CTA B today, vent supported breaths GI: soft, nontender, no masses Extremities: no edema, warm, acyanotic   LABS:  BMET  Recent Labs Lab 03/20/16 0350 03/21/16 0254 03/22/16 0835  NA 137 135 144  K 4.4 3.9 3.5  CL 105 107 110  CO2 17* 22 24  BUN 26* 38* 41*  CREATININE 1.81* 1.54* 1.36*  GLUCOSE 224* 208* 231*    Electrolytes  Recent Labs Lab 03/18/16 2034  03/19/16 0622  03/20/16 0350 03/21/16 0254 03/22/16 0835  CALCIUM  --   < > 8.2*  < > 8.2* 8.3* 8.4*  MG 2.0  --  1.8  --  2.2  --   --   PHOS  --   --  3.3  --   --   --   --   < > = values in this interval not displayed.  CBC  Recent Labs Lab 03/20/16 0350 03/21/16 0254 03/22/16 0320  WBC 10.7* 10.7* 9.2  HGB 9.3* 9.3* 10.1*  HCT 28.2* 28.5* 30.1*  PLT 178 211 256    Coag's  Recent Labs Lab 03/18/16 1607  APTT 33  INR 1.29     Sepsis Markers  Recent Labs Lab 03/18/16 2034 03/18/16 2050 03/19/16 0011 03/19/16 1531  LATICACIDVEN  --  2.6*  2.60* 3.9* 1.4  PROCALCITON 1.23  --   --   --     ABG  Recent Labs Lab 03/19/16 0618 03/19/16 1523 03/20/16 1159  PHART 7.224* 7.294* 7.229*  PCO2ART 40.8 41.7 50.0*  PO2ART 66.0* 243.0* 57.0*    Liver Enzymes  Recent Labs Lab 03/18/16 1607 03/19/16 0338 03/20/16 0350  AST 37 76* 52*  ALT 19 27 26   ALKPHOS 42 40 37*  BILITOT 0.7 0.7 0.8  ALBUMIN 2.8* 2.8* 2.3*    Cardiac Enzymes  Recent Labs Lab 03/18/16 2034 03/19/16 0010 03/19/16 0217  TROPONINI 0.13* 0.11* 0.10*    Glucose  Recent Labs Lab 03/21/16 1206 03/21/16 1525 03/21/16 2034 03/21/16 2347 03/22/16 0335 03/22/16 0837  GLUCAP 214* 227* 234* 190* 251* 194*    Imaging 3/22 CXR images reviewed> improved left lung infiltrate, ETT in place  STUDIES:  CT Head 3/19 >> no acute process, chronic atrophy & small vessl disease ECHO 3/20 >> LVEF 30-35%, global hypokinesis, PA estimate 39mm Hg EEG  3/21 > slowing but no seizure  CULTURES: Blood cx 3/19 >> RVP 3/19 >> neg Flu 3/19 >> neg   ANTIBIOTICS: Vanc 3/19 >> 3/22 Aztreonam 3/19 >> 10 days Levaquin 3/19 >> 3/22  SIGNIFICANT EVENTS: 3/19  Admit with suspected PNA, hypoxic respiratory failure  LINES/TUBES: ETT 3/20 > 3/23 Foley 3/20  DISCUSSION: Julian Ross is a 73M with acute hypoxemic respiratory failure requiring mechanical ventilation. He has a significant cardiac history with prior MI and prior stenting, RA on methotrexate/pred. He had HCAP and AKI on admission, both improving but has persistent tremors felt to be due to myoclonus.  Has new Afib and congestive heart failure.  ASSESSMENT / PLAN:  PULMONARY A: Acute hypoxemic respiratory failure - in setting of PNA +/- edema > improved; passing SBT HCAP - immunocompromised with RA OSA - on CPAP, ? compliance P:   Extubate Wean PEEP / FiO2 for sats > 92%   Will need CPAP QHS post extubation    CARDIOVASCULAR A:  New onset A fib - rate controlled  Chronic systolic CHF (LVEF 99991111) Mild troponin leak, demand ischemia Hx MI, CAD s/p PCI - reportedly has 16 stents per wife Hx ICM  P:  Heparin infusion ICU monitoring of hemodynamics Continue plavix  Restart cozaar  Start metoprolol 2.5 IV q6h  RENAL A:   Acute on chronic kidney failure > better CKD III P:   Renal dose meds Monitor BMET and UOP Replace electrolytes as needed  GASTROINTESTINAL A:   GERD   P:   Stress ulcer ppx SLP evaluation post extubation  HEMATOLOGIC A:   Anemia - suspect element of chronic disease P:  Monitor CBC Lovenox for DVT prophylaxis   INFECTIOUS A:   Severe sepsis secondary to pneumonia > improving P:   Continue aztreonam 10 days   AUTOIMMUNE:  A:  RA -  on plaquenil / prednisone 5mg  QD P:  Hold home plaquenil Restart prednisone 10mg , then drop to 5mg  at home   ENDOCRINE A:   DMII P:   Accuchecks and SSI   NEUROLOGIC A:   Acute encephalopathy from home polypharmacy, ICU delirium Recurrent tremors> myoclonus per neurology, resolved Anxiety - uses xanax at home, family reports polypharmacy Diabetic Neuropathy  Essential Tremor  Partial Blindness  Hx CVA  P:   Stop keppra Continue home meds (gabapentin, paxil, topamax) Monitor for benzo withdrawal   FAMILY  - Updates:  Wife updated extensively at bedside 3/23.  Updated son 3/22 (physician)    - Inter-disciplinary family meet or Palliative Care meeting due by:  3/27  My cc time 37 minutes  Roselie Awkward, MD [place] [facility] Pager: [phone] Cell: [phone] After 3pm or if no response, call [phone]

## 2016-03-22 NOTE — Progress Notes (Signed)
eLink Physician-Brief Progress Note Patient Name: Julian Ross DOB: 15-Aug-1946 MRN: [id number]   Date of Service  03/22/2016  HPI/Events of Note  Rapid ventricular rate with slight improvement after bolus diltiazem 25 mg & initiation of infusion at 5 mg per hour. Heart rate still in the 120s intermittently. Saturation 91%. I contacted the patient's bedside nurse to make her aware of the following interventions.  eICU Interventions  1. Increase diltiazem infusion to 10 mg per hour 2. Bolus diltiazem 35 mg IV 1 via infusion     Intervention Category Major Interventions: Arrhythmia - evaluation and management  Tera Partridge 03/22/2016, 9:23 PM

## 2016-03-23 ENCOUNTER — Inpatient Hospital Stay (HOSPITAL_COMMUNITY): Payer: Medicare Other

## 2016-03-23 LAB — CULTURE, BLOOD (ROUTINE X 2)
CULTURE: NO GROWTH
Culture: NO GROWTH

## 2016-03-23 LAB — CBC WITH DIFFERENTIAL/PLATELET
Basophils Absolute: 0 10*3/uL (ref 0.0–0.1)
Basophils Relative: 0 %
EOS PCT: 1 %
Eosinophils Absolute: 0.1 10*3/uL (ref 0.0–0.7)
HEMATOCRIT: 29.9 % — AB (ref 39.0–52.0)
HEMOGLOBIN: 9.6 g/dL — AB (ref 13.0–17.0)
LYMPHS ABS: 1.5 10*3/uL (ref 0.7–4.0)
Lymphocytes Relative: 13 %
MCH: 29.8 pg (ref 26.0–34.0)
MCHC: 32.1 g/dL (ref 30.0–36.0)
MCV: 92.9 fL (ref 78.0–100.0)
MONO ABS: 0.6 10*3/uL (ref 0.1–1.0)
MONOS PCT: 5 %
NEUTROS ABS: 9.7 10*3/uL — AB (ref 1.7–7.7)
Neutrophils Relative %: 81 %
Platelets: 269 10*3/uL (ref 150–400)
RBC: 3.22 MIL/uL — ABNORMAL LOW (ref 4.22–5.81)
RDW: 14.5 % (ref 11.5–15.5)
WBC: 11.9 10*3/uL — ABNORMAL HIGH (ref 4.0–10.5)

## 2016-03-23 LAB — POCT I-STAT 3, ART BLOOD GAS (G3+)
Bicarbonate: 26.4 mEq/L — ABNORMAL HIGH (ref 20.0–24.0)
O2 Saturation: 100 %
PH ART: 7.344 — AB (ref 7.350–7.450)
PO2 ART: 342 mmHg — AB (ref 80.0–100.0)
TCO2: 28 mmol/L (ref 0–100)
pCO2 arterial: 48.1 mmHg — ABNORMAL HIGH (ref 35.0–45.0)

## 2016-03-23 LAB — BASIC METABOLIC PANEL
ANION GAP: 10 (ref 5–15)
BUN: 32 mg/dL — ABNORMAL HIGH (ref 6–20)
CHLORIDE: 112 mmol/L — AB (ref 101–111)
CO2: 25 mmol/L (ref 22–32)
Calcium: 8.4 mg/dL — ABNORMAL LOW (ref 8.9–10.3)
Creatinine, Ser: 1.32 mg/dL — ABNORMAL HIGH (ref 0.61–1.24)
GFR calc non Af Amer: 53 mL/min — ABNORMAL LOW (ref >60)
Glucose, Bld: 205 mg/dL — ABNORMAL HIGH (ref 65–99)
POTASSIUM: 3.4 mmol/L — AB (ref 3.5–5.1)
Sodium: 147 mmol/L — ABNORMAL HIGH (ref 135–145)

## 2016-03-23 LAB — GLUCOSE, CAPILLARY
GLUCOSE-CAPILLARY: 183 mg/dL — AB (ref 65–99)
GLUCOSE-CAPILLARY: 190 mg/dL — AB (ref 65–99)
GLUCOSE-CAPILLARY: 207 mg/dL — AB (ref 65–99)
GLUCOSE-CAPILLARY: 208 mg/dL — AB (ref 65–99)
GLUCOSE-CAPILLARY: 215 mg/dL — AB (ref 65–99)
Glucose-Capillary: 203 mg/dL — ABNORMAL HIGH (ref 65–99)

## 2016-03-23 LAB — PHOSPHORUS: Phosphorus: 2.3 mg/dL — ABNORMAL LOW (ref 2.5–4.6)

## 2016-03-23 LAB — MAGNESIUM: Magnesium: 2 mg/dL (ref 1.7–2.4)

## 2016-03-23 LAB — HEPARIN LEVEL (UNFRACTIONATED): Heparin Unfractionated: 0.42 IU/mL (ref 0.30–0.70)

## 2016-03-23 LAB — AMMONIA: Ammonia: 27 umol/L (ref 9–35)

## 2016-03-23 LAB — POTASSIUM: Potassium: 3.3 mmol/L — ABNORMAL LOW (ref 3.5–5.1)

## 2016-03-23 MED ORDER — ETOMIDATE 2 MG/ML IV SOLN
20.0000 mg | Freq: Once | INTRAVENOUS | Status: AC
  Administered 2016-03-23: 20 mg via INTRAVENOUS

## 2016-03-23 MED ORDER — VITAL HIGH PROTEIN PO LIQD
1000.0000 mL | ORAL | Status: DC
  Administered 2016-03-23: 1000 mL
  Administered 2016-03-24 (×2)
  Administered 2016-03-24: 1000 mL
  Filled 2016-03-23: qty 1000

## 2016-03-23 MED ORDER — ROCURONIUM BROMIDE 50 MG/5ML IV SOLN
100.0000 mg | Freq: Once | INTRAVENOUS | Status: AC
  Administered 2016-03-23: 100 mg via INTRAVENOUS
  Filled 2016-03-23: qty 10

## 2016-03-23 MED ORDER — POTASSIUM CHLORIDE 10 MEQ/100ML IV SOLN
10.0000 meq | INTRAVENOUS | Status: AC
  Administered 2016-03-23 (×2): 10 meq via INTRAVENOUS

## 2016-03-23 MED ORDER — PRO-STAT SUGAR FREE PO LIQD
60.0000 mL | Freq: Two times a day (BID) | ORAL | Status: DC
  Administered 2016-03-23 – 2016-03-25 (×4): 60 mL
  Filled 2016-03-23 (×6): qty 60

## 2016-03-23 MED ORDER — DEXTROSE 5 % IV SOLN
20.0000 mmol | Freq: Once | INTRAVENOUS | Status: AC
  Administered 2016-03-23: 20 mmol via INTRAVENOUS
  Filled 2016-03-23: qty 6.67

## 2016-03-23 MED ORDER — FUROSEMIDE 10 MG/ML IJ SOLN
60.0000 mg | Freq: Four times a day (QID) | INTRAMUSCULAR | Status: AC
  Administered 2016-03-23 (×2): 60 mg via INTRAVENOUS
  Filled 2016-03-23 (×3): qty 6

## 2016-03-23 MED ORDER — FENTANYL CITRATE (PF) 100 MCG/2ML IJ SOLN
25.0000 ug | INTRAMUSCULAR | Status: DC | PRN
  Administered 2016-03-24: 100 ug via INTRAVENOUS
  Administered 2016-03-24: 50 ug via INTRAVENOUS
  Filled 2016-03-23 (×2): qty 2

## 2016-03-23 MED ORDER — DEXMEDETOMIDINE HCL IN NACL 200 MCG/50ML IV SOLN
0.4000 ug/kg/h | INTRAVENOUS | Status: DC
  Administered 2016-03-23: 0.4 ug/kg/h via INTRAVENOUS
  Filled 2016-03-23: qty 50

## 2016-03-23 MED ORDER — ISOSORB DINITRATE-HYDRALAZINE 20-37.5 MG PO TABS
1.0000 | ORAL_TABLET | Freq: Two times a day (BID) | ORAL | Status: DC
  Administered 2016-03-23 – 2016-03-27 (×9): 1 via ORAL
  Filled 2016-03-23 (×16): qty 1

## 2016-03-23 MED ORDER — METOPROLOL TARTRATE 25 MG/10 ML ORAL SUSPENSION
12.5000 mg | Freq: Four times a day (QID) | ORAL | Status: DC
  Administered 2016-03-23 – 2016-03-25 (×6): 12.5 mg
  Filled 2016-03-23 (×9): qty 5

## 2016-03-23 MED ORDER — DEXMEDETOMIDINE HCL IN NACL 400 MCG/100ML IV SOLN
0.4000 ug/kg/h | INTRAVENOUS | Status: DC
  Administered 2016-03-23 (×2): 0.4 ug/kg/h via INTRAVENOUS
  Administered 2016-03-24: 0.3 ug/kg/h via INTRAVENOUS
  Filled 2016-03-23 (×3): qty 100

## 2016-03-23 NOTE — Progress Notes (Signed)
eLink Physician-Brief Progress Note Patient Name: Julian Ross DOB: Jun 25, 1946 MRN: [id number]   Date of Service  03/23/2016  HPI/Events of Note  Pt extubated then reintubated. Needs TF re started  eICU Interventions  Will ask dietary reccomendations and start TF        [place] 03/23/2016, 3:41 PM

## 2016-03-23 NOTE — Progress Notes (Signed)
ANTICOAGULATION CONSULT NOTE - Follow Up Consult  Pharmacy Consult for heparin  Indication: atrial fibrillation  Allergies  Allergen Reactions  . Ace Inhibitors Cough  . Atenolol Other (See Comments)    Unknown reaction  . Contrast Media [Iodinated Diagnostic Agents] Rash  . Penicillins Hives and Rash    Has patient had a PCN reaction causing immediate rash, facial/tongue/throat swelling, SOB or lightheadedness with hypotension: Yes Has patient had a PCN reaction causing severe rash involving mucus membranes or skin necrosis: No Has patient had a PCN reaction that required hospitalization pt was in the hospital at time of last reaction - heart attack Has patient had a PCN reaction occurring within the last 10 years: No If all of the above answers are "NO", then may proceed with Cephalosporin use.    Patient Measurements: Height: 5\' 11"  (180.3 cm) Weight: 208 lb 1.8 oz (94.4 kg) IBW/kg (Calculated) : 75.3  Vital Signs: Temp: 99 F (37.2 C) (03/24 1131) Temp Source: Oral (03/24 1131) BP: 138/94 mmHg (03/24 1135) Pulse Rate: 107 (03/24 1135)  Labs:  Recent Labs  03/21/16 0254  03/22/16 0320 03/22/16 0835 03/22/16 1023 03/22/16 1550 03/23/16 0325 03/23/16 0425  HGB 9.3*  --  10.1*  --   --   --  9.6*  --   HCT 28.5*  --  30.1*  --   --   --  29.9*  --   PLT 211  --  256  --   --   --  269  --   HEPARINUNFRC  --   < > 0.22*  --  0.38 0.48  --  0.42  CREATININE 1.54*  --   --  1.36*  --   --  1.32*  --   < > = values in this interval not displayed.  Estimated Creatinine Clearance: 61.9 mL/min (by C-G formula based on Cr of 1.32).  Assessment: 70 yo M on heparin for afib. Heparin level remains therapeutic at 0.42 on Heparin gtt at 1850 units/hr. CBC stable. No issues with line or bleeding reported per RN.  Goal of Therapy:  Heparin level 0.3-0.7 units/ml Monitor platelets by anticoagulation protocol: Yes   Plan:  Continue Heparin infusion at 1850 units/hr Daily  HL/CBC Monitor s/sx bleeding  Legrand Como, Pharm.D., BCPS, AAHIVP Clinical Pharmacist Phone: [phone] or [phone] 03/23/2016, 11:49 AM

## 2016-03-23 NOTE — Progress Notes (Signed)
Pt vomited over ET tube brown bile after administering guafinesin per tube. Tube placement was checked before administration. MD made aware. Will continue to monitor for vomiting.

## 2016-03-23 NOTE — Progress Notes (Signed)
[facility] Progress Note Patient Name: Julian Ross DOB: November 26, 1946 MRN: [id number]   Date of Service  03/23/2016  HPI/Events of Note  K+ = 3.3, PO4--- = 2.3 and Creatinine = 1.32.  eICU Interventions  Will replete K+ and PO4---.     Intervention Category Major Interventions: Electrolyte abnormality - evaluation and management  Sommer,Steven Eugene 03/23/2016, 10:59 PM

## 2016-03-23 NOTE — Progress Notes (Signed)
Inpatient Diabetes Program Recommendations  AACE/ADA: New Consensus Statement on Inpatient Glycemic Control (2015)  Target Ranges:  Prepandial:   less than 140 mg/dL      Peak postprandial:   less than 180 mg/dL (1-2 hours)      Critically ill patients:  140 - 180 mg/dL   Review of Glycemic Control  Results for Julian Ross, Julian Ross (MRN [id number]) as of 03/23/2016 13:41  Ref. Range 03/23/2016 00:04 03/23/2016 03:43 03/23/2016 08:12 03/23/2016 11:29  Glucose-Capillary Latest Ref Range: 65-99 mg/dL 215 (H) 190 (H) 183 (H) 208 (H)    Inpatient Diabetes Program Recommendations:    ICU Hyperglycemia Protocol  Thank you. Lorenda Peck, RD, LDN, CDE Inpatient Diabetes Coordinator [phone]

## 2016-03-23 NOTE — Progress Notes (Signed)
SLP Cancellation Note  Patient Details Name: Julian Ross MRN: [id number] DOB: [birth year]   Cancelled treatment:       Reason Eval/Treat Not Completed: Medical issues which prohibited therapy. Pt intubated. Will sign off and await new orders.    Jabreel Chimento, Katherene Ponto 03/23/2016, 8:40 AM

## 2016-03-23 NOTE — Progress Notes (Signed)
Patient Profile: 78 y/oM with CAD s/p MI and PCI, chronic systolic heart failure, hypertension, hyperlipidemia, and diabetes mellitus who is here with hypoxic respiratory failure and pneumonia, new-onset atrial fibrillation.  Interval History:  patient last seen by our service 2 days ago, 03/21/16. Patient at that time was rate controlled w/o any nodal agents and was protected with IV heparin. He was extubated earlier today, then shortly after extubation went into afib with RVR, hypertensive, hypoxemic, needed re-intubation. Cardiology re consulted for additional recommendations. Now on IV Cardizem. Remains on IV heparin.   Subjective: Intubated. Alert. Unable to report any history.   Objective: Vital signs in last 24 hours: Temp:  [97.6 F (36.4 C)-100.2 F (37.9 C)] 100.2 F (37.9 C) (03/24 1541) Pulse Rate:  [52-139] 117 (03/24 1602) Resp:  [16-29] 22 (03/24 1602) BP: (100-183)/(65-112) 163/98 mmHg (03/24 1600) SpO2:  [89 %-100 %] 100 % (03/24 1602) FiO2 (%):  [40 %-100 %] 40 % (03/24 1602) Weight:  [208 lb 1.8 oz (94.4 kg)] 208 lb 1.8 oz (94.4 kg) (03/24 0318)    Intake/Output from previous day: 03/23 0701 - 03/24 0700 In: 2207.6 [I.V.:862.6; NG/GT:375; IV Piggyback:670] Out: 2630 [Urine:2330; Emesis/NG output:300] Intake/Output this shift: Total I/O In: 357.3 [I.V.:307.3; IV Piggyback:50] Out: 1925 [Urine:1925]  Medications Current Facility-Administered Medications  Medication Dose Route Frequency Provider Last Rate Last Dose  . 0.9 %  sodium chloride infusion  250 mL Intravenous PRN Dannielle Burn, MD 10 mL/hr at 03/22/16 0700 250 mL at 03/22/16 0700  . acetaminophen (TYLENOL) tablet 650 mg  650 mg Oral Q6H PRN Juanito Doom, MD   650 mg at 03/19/16 1355  . antiseptic oral rinse (CPC / CETYLPYRIDINIUM CHLORIDE 0.05%) solution 7 mL  7 mL Mouth Rinse q12n4p Juanito Doom, MD   7 mL at 03/23/16 1200  . aztreonam (AZACTAM) 2 g in dextrose 5 % 50 mL IVPB  2 g  Intravenous 3 times per day Juanito Doom, MD 100 mL/hr at 03/23/16 1400 2 g at 03/23/16 1400  . chlorhexidine (PERIDEX) 0.12 % solution 15 mL  15 mL Mouth Rinse BID Juanito Doom, MD   15 mL at 03/23/16 0916  . Chlorhexidine Gluconate Cloth 2 % PADS 6 each  6 each Topical Q0600 Rush Farmer, MD   6 each at 03/23/16 0600  . clopidogrel (PLAVIX) tablet 75 mg  75 mg Per Tube Daily Rush Farmer, MD   75 mg at 03/23/16 0911  . dexmedetomidine (PRECEDEX) 400 MCG/100ML (4 mcg/mL) infusion  0.4-1.2 mcg/kg/hr Intravenous Titrated Rush Farmer, MD   Stopped at 03/23/16 0745  . diltiazem (CARDIZEM) 100 mg in dextrose 5 % 100 mL (1 mg/mL) infusion  5-15 mg/hr Intravenous Continuous Javier Glazier, MD 10 mL/hr at 03/23/16 1513 10 mg/hr at 03/23/16 1513  . famotidine (PEPCID) 40 MG/5ML suspension 20 mg  20 mg Per Tube Daily Dannielle Burn, MD   20 mg at 03/23/16 0913  . feeding supplement (PRO-STAT SUGAR FREE 64) liquid 60 mL  60 mL Per Tube BID [place], MD      . feeding supplement (VITAL HIGH PROTEIN) liquid 1,000 mL  1,000 mL Per Tube Q24H Jose Angelo A de Dios, MD      . fentaNYL (SUBLIMAZE) injection 25-100 mcg  25-100 mcg Intravenous Q2H PRN Brand Males, MD      . folic acid (FOLVITE) tablet 1 mg  1 mg Oral Daily Douglas B  McQuaid, MD   1 mg at 03/23/16 0912  . furosemide (LASIX) injection 60 mg  60 mg Intravenous Q6H Juanito Doom, MD   60 mg at 03/23/16 1130  . gabapentin (NEURONTIN) capsule 300 mg  300 mg Oral TID Juanito Doom, MD   300 mg at 03/23/16 0912  . guaiFENesin (ROBITUSSIN) 100 MG/5ML solution 300 mg  15 mL Per Tube 4 times per day Rush Farmer, MD   300 mg at 03/23/16 1200  . heparin ADULT infusion 100 units/mL (25000 units/250 mL)  1,850 Units/hr Intravenous Continuous Franky Macho, RPH 18.5 mL/hr at 03/23/16 1513 1,850 Units/hr at 03/23/16 1513  . hydrALAZINE (APRESOLINE) injection 10-40 mg  10-40 mg Intravenous Q4H PRN Juanito Doom,  MD      . insulin aspart (novoLOG) injection 0-20 Units  0-20 Units Subcutaneous 6 times per day Juanito Doom, MD   7 Units at 03/23/16 1200  . ipratropium-albuterol (DUONEB) 0.5-2.5 (3) MG/3ML nebulizer solution 3 mL  3 mL Nebulization Q2H PRN Norval Morton, MD   3 mL at 03/19/16 0453  . isosorbide-hydrALAZINE (BIDIL) 20-37.5 MG per tablet 1 tablet  1 tablet Oral BID Juanito Doom, MD   1 tablet at 03/23/16 1230  . levETIRAcetam (KEPPRA) 1,000 mg in sodium chloride 0.9 % 100 mL IVPB  1,000 mg Intravenous Q12H Kerney Elbe, MD   1,000 mg at 03/23/16 0536  . LORazepam (ATIVAN) injection 2 mg  2 mg Intravenous Q4H PRN Juanito Doom, MD   2 mg at 03/21/16 1100  . losartan (COZAAR) tablet 50 mg  50 mg Oral Daily Juanito Doom, MD   50 mg at 03/23/16 0914  . mupirocin ointment (BACTROBAN) 2 % 1 application  1 application Nasal BID Rush Farmer, MD   1 application at [id number] [phone]  . PARoxetine (PAXIL) tablet 40 mg  40 mg Oral Daily Juanito Doom, MD   40 mg at 03/23/16 0911  . predniSONE (DELTASONE) tablet 10 mg  10 mg Oral Q breakfast Juanito Doom, MD   10 mg at 03/23/16 0909  . topiramate (TOPAMAX) tablet 25 mg  25 mg Oral BID Juanito Doom, MD   25 mg at 03/23/16 0914    PE: General appearance: alert and intubated Neck: no JVD Lungs: intuabted, CTA Heart: irregularly irregular rhythm, tachy rate Extremities: warm and dry Pulses: 2+ and symmetric Skin: Skin color, texture, turgor normal. No rashes or lesions   Lab Results:   Recent Labs  03/21/16 0254 03/22/16 0320 03/23/16 0325  WBC 10.7* 9.2 11.9*  HGB 9.3* 10.1* 9.6*  HCT 28.5* 30.1* 29.9*  PLT 211 256 269   BMET  Recent Labs  03/21/16 0254 03/22/16 0835 03/23/16 0325  NA 135 144 147*  K 3.9 3.5 3.4*  CL 107 110 112*  CO2 22 24 25   GLUCOSE 208* 231* 205*  BUN 38* 41* 32*  CREATININE 1.54* 1.36* 1.32*  CALCIUM 8.3* 8.4* 8.4*   PT/INR No results for input(s): LABPROT, INR in the last  72 hours. Cholesterol  Recent Labs  03/22/16 0320  CHOL 123   Cardiac Panel (last 3 results) No results for input(s): CKTOTAL, CKMB, TROPONINI, RELINDX in the last 72 hours.    Assessment/Plan  Principal Problem:   Septic shock (HCC) Active Problems:   CKD (chronic kidney disease), stage III   Edema leg   Diabetes mellitus type 2 in obese (HCC)   Elevated troponin  Anemia   Acute hypoxemic respiratory failure (HCC)   Altered mental status   Atrial fibrillation, new onset (HCC)   Acute on chronic systolic and diastolic heart failure, NYHA class 1 (HCC)   Acute respiratory failure with hypoxemia (HCC)   AKI (acute kidney injury) ([place])   1. Atrial Fibrillation w/ RVR: currently on IV Cardizem at 10 mg/hr. Rate remains poorly controlled in the 120s. He is hypertensive, thus there is room to further increase his Cardizem to 15 mg/hr.  Can also consider adding low dose scheduled IV metoprolol for additional rate control (would need to monitor for bradycardia ? History with BBs). Continue IV heparin for anticoagulation. He is hypokalemic with K at 3.4. Repleat K. Mg lab pending. Supplement Mg if needed. He is also anemic with hgb now at 9.6. Continue to treat electrolyte disturbances and anemia, as both can potentially exacerbate his arrhthymias. Ok to treat with IV Cardizem short term for rate control, but would avoid long term use with PO Cardizem given LV systolic dysfunction with EF of 30-35%.    2. CAD: Mr. Ovitt has an extensive history of CAD with up to 16 prior stents. Troponin was mildly elevated at 0.13. EKG has not been concerning for active ischemia. Continue home Plavix and heparin. He is not on a beta blocker at baseline, presumably due to bradycardia or another allergy, as he has a reported allergy to atenolol. He is also not on a statin. Presumably statin intolerant. however recent lipid panel 03/22/16 showed LDL to be at goal of <70 mg/dL. Recent LDL was 67 mg/dL.       LOS: 5 days    Brittainy M. Ladoris Gene 03/23/2016 4:40 PM

## 2016-03-23 NOTE — Progress Notes (Signed)
eLink Physician-Brief Progress Note Patient Name: Julian Ross DOB: [birth year] MRN: [id number]   Date of Service  03/23/2016  HPI/Events of Note  ALOC - awakens to sternal rub. No cough. No gag. Patient on Heparin IV infusion.   eICU Interventions  Will order Head CT Scan without contrast now.      Intervention Category Major Interventions: Change in mental status - evaluation and management  Jihan Mellette Cornelia Copa 03/23/2016, 6:37 AM

## 2016-03-23 NOTE — Progress Notes (Signed)
eLink Physician-Brief Progress Note Patient Name: Julian Ross DOB: July 06, 1946 MRN: [id number]   Date of Service  03/23/2016  HPI/Events of Note  RN calls for lab order for K. pts K was 3.4 today and pt received lasix. Also, had EKG done. Cards is involved.   eICU Interventions  Ordered K to be drawn.  EKG with afib in SVR, lateral wall ischemia. RN to call cards.      Intervention Category Intermediate Interventions: Other:  [place] 03/23/2016, 9:07 PM

## 2016-03-23 NOTE — Progress Notes (Signed)
Spoke to [place] regarding pt progressing aloc brief response to sternal rub and contd labored rr, nurse will inform elink md

## 2016-03-23 NOTE — Progress Notes (Signed)
Patient is currently on 6L [place] with O2 sat of 91%. Patient is not following commands and will not be able to tolerate cpap at this time. RT obtained ABG per Dr. Ashok Cordia.  RT will continue to monitor as needed.

## 2016-03-23 NOTE — Progress Notes (Signed)
Arrived to room to find pt aloc o2 sat 88% aloc rr 28/min and labored, oral laryngeal suction done no gag or cough reflex noted oxygen increased to 6 liters [place] with improvement of sat to 94% RT to bedside for asst. Called Dr Ashok Cordia, camera visual done by md, will do abgs per md order.

## 2016-03-23 NOTE — Progress Notes (Signed)
PULMONARY / CRITICAL CARE MEDICINE   Name: Julian Ross MRN: [id number] DOB: [birth year]    ADMISSION DATE:  03/18/2016 CONSULTATION DATE:  03/19/16  REFERRING MD:  Fuller Plan  CHIEF COMPLAINT:  Shortness of breath, confusion  SUBJECTIVE:   Extubated Then after extubation went into afib with RVR, hypertensive, hypoxemic, needed re-intubation Not following commands this morning  VITAL SIGNS: BP 182/101 mmHg  Pulse 113  Temp(Src) 99.9 F (37.7 C) (Oral)  Resp 18  Ht 5\' 11"  (1.803 m)  Wt 94.4 kg (208 lb 1.8 oz)  BMI 29.04 kg/m2  SpO2 100%  HEMODYNAMICS:    VENTILATOR SETTINGS: Vent Mode:  [-] PRVC FiO2 (%):  [40 %-100 %] 50 % Set Rate:  [16 bmp] 16 bmp Vt Set:  [600 mL] 600 mL PEEP:  [5 cmH20] 5 cmH20 Plateau Pressure:  [16 cmH20-30 cmH20] 26 cmH20  INTAKE / OUTPUT: I/O last 3 completed shifts: In: 3928.5 [I.V.:1623.5; Other:300; NG/GT:1015; IV Piggyback:990] Out: [id number]; Emesis/NG output:300]  PHYSICAL EXAMINATION: General: awakens to voice Neuro: wakes up, focuses on me, nods head, answers questions, no tremor today CV: irreg irreg , no mgr PULM: CTA B today, vent supported breaths GI: soft, nontender, no masses Extremities: no edema, warm, acyanotic  Neuro: opens eyes to voice, follows commands, lifts right hand  LABS:  BMET  Recent Labs Lab 03/21/16 0254 03/22/16 0835 03/23/16 0325  NA 135 144 147*  K 3.9 3.5 3.4*  CL 107 110 112*  CO2 22 24 25   BUN 38* 41* 32*  CREATININE 1.54* 1.36* 1.32*  GLUCOSE 208* 231* 205*    Electrolytes  Recent Labs Lab 03/19/16 0622  03/20/16 0350 03/21/16 0254 03/22/16 0835 03/22/16 1900 03/23/16 0325  CALCIUM 8.2*  < > 8.2* 8.3* 8.4*  --  8.4*  MG 1.8  --  2.2  --   --  2.1  --   PHOS 3.3  --   --   --   --   --   --   < > = values in this interval not displayed.  CBC  Recent Labs Lab 03/21/16 0254 03/22/16 0320 03/23/16 0325  WBC 10.7* 9.2 11.9*  HGB 9.3* 10.1* 9.6*  HCT 28.5*  30.1* 29.9*  PLT 211 256 269    Coag's  Recent Labs Lab 03/18/16 1607  APTT 33  INR 1.29    Sepsis Markers  Recent Labs Lab 03/18/16 2034 03/18/16 2050 03/19/16 0011 03/19/16 1531  LATICACIDVEN  --  2.6*  2.60* 3.9* 1.4  PROCALCITON 1.23  --   --   --     ABG  Recent Labs Lab 03/20/16 1159 03/22/16 2041 03/23/16 0200  PHART 7.229* 7.364 7.344*  PCO2ART 50.0* 44.4 48.1*  PO2ART 57.0* 67.0* 342.0*    Liver Enzymes  Recent Labs Lab 03/18/16 1607 03/19/16 0338 03/20/16 0350  AST 37 76* 52*  ALT 19 27 26   ALKPHOS 42 40 37*  BILITOT 0.7 0.7 0.8  ALBUMIN 2.8* 2.8* 2.3*    Cardiac Enzymes  Recent Labs Lab 03/18/16 2034 03/19/16 0010 03/19/16 0217  TROPONINI 0.13* 0.11* 0.10*    Glucose  Recent Labs Lab 03/22/16 1224 03/22/16 1526 03/22/16 2003 03/23/16 0004 03/23/16 0343 03/23/16 0812  GLUCAP 215* 219* 78 215* 190* 183*    Imaging 3/22 CXR images reviewed> improved left lung infiltrate, ETT in place  STUDIES:  CT Head 3/19 >> no acute process, chronic atrophy & small vessl disease ECHO 3/20 >> LVEF 30-35%, global hypokinesis, PA estimate  54mm Hg EEG 3/21 > slowing but no seizure EEG 3/22> no seizure 3/24 CT head >   CULTURES: Blood cx 3/19 >> RVP 3/19 >> neg Flu 3/19 >> neg   ANTIBIOTICS: Vanc 3/19 >> 3/22 Aztreonam 3/19 >> 10 days Levaquin 3/19 >> 3/22  SIGNIFICANT EVENTS: 3/19  Admit with suspected PNA, hypoxic respiratory failure  LINES/TUBES: ETT 3/20 > 3/23 Foley 3/20  3/24 CXR > worsenign bilateral airspace disease  DISCUSSION: Julian Ross is a 6M with acute hypoxemic respiratory failure requiring mechanical ventilation. He has a significant cardiac history with prior MI and prior stenting, RA on methotrexate/pred. He had HCAP and AKI on admission, both improving but has persistent tremors felt to be due to myoclonus.  Has new Afib and congestive heart failure.  ASSESSMENT / PLAN:  PULMONARY A: Acute  hypoxemic respiratory failure - intially due to pneumonia, then last night from acute pulmonary edema HCAP - immunocompromised with RA OSA - on CPAP, ? compliance P:   Full vent support Diurese VAP prevention bundle Wean PEEP / FiO2 for sats > 92%  Will need CPAP QHS post extubation    CARDIOVASCULAR A:  New onset A fib - worse control overnight Chronic systolic CHF (LVEF 99991111) acute decompensation overnight Mild troponin leak, demand ischemia Hx MI, CAD s/p PCI - reportedly has 16 stents per wife Hx ICM  P:  Heparin infusion Diltiazem infusion Start BIDIL ICU monitoring of hemodynamics Continue plavix  Continue cozaar   RENAL A:   Acute on chronic kidney failure > better CKD III P:   Renal dose meds Monitor BMET and UOP Replace electrolytes as needed  GASTROINTESTINAL A:   GERD   P:   Stress ulcer ppx SLP evaluation post extubation  HEMATOLOGIC A:   Anemia - suspect element of chronic disease P:  Monitor CBC Lovenox for DVT prophylaxis   INFECTIOUS A:   Severe sepsis secondary to pneumonia > improving P:   Continue aztreonam 10 days   AUTOIMMUNE:  A:  RA -  on plaquenil / prednisone 5mg  QD P:  Hold home plaquenil Restart prednisone 10mg , then drop to 5mg  at home   ENDOCRINE A:   DMII P:   Accuchecks and SSI   NEUROLOGIC A:   Acute encephalopathy from home polypharmacy, ICU delirium > improving but slow to wake up Recurrent tremors> myoclonus per neurology, resolved Anxiety - uses xanax at home, family reports polypharmacy Diabetic Neuropathy  Essential Tremor  Partial Blindness  Hx CVA  P:   Monitor off Keppra CT head today Continue home meds (gabapentin, paxil, topamax) Monitor for benzo withdrawal   FAMILY  - Updates:  Wife updated extensively at bedside 3/24.  Updated son 3/22 (physician)    - Inter-disciplinary family meet or Palliative Care meeting due by:  3/27  My cc time 36 minutes  Roselie Awkward, MD [place]  PCCM Pager: [phone] Cell: [phone] After 3pm or if no response, call [phone]

## 2016-03-23 NOTE — Progress Notes (Signed)
Nutrition Follow-up  DOCUMENTATION CODES:   Obesity unspecified  INTERVENTION:  -Recommend resuming Vital High Protein via OG tube to goal rate of 50 ml/h (1200 ml/d) and provide 60 ml of Prostat BID.   Provides 1600 kcal, 165 grams of protein, 1003 ml of free water   NUTRITION DIAGNOSIS:   Inadequate oral intake related to inability to eat as evidenced by NPO status.  -Ongoing  GOAL:   Provide needs based on ASPEN/SCCM guidelines  -Unmet  MONITOR:   TF tolerance, Weight trends, Vent status, Labs  REASON FOR ASSESSMENT:   Consult Enteral/tube feeding initiation and management  ASSESSMENT:   Pt w/ history of HTN, DMII, CAD s/p stent, prior MI, HLD who presents with worsening shortness of breath over the last few days.   3/20-Intubated 3/20-OG tube placed 3/23-Extubated, TF turned off 3/24-Intubated, respiratory insufficiency   MV: 10.4 L/min Temp (24hrs), Avg:98.8 F (37.1 C), Min:97.6 F (36.4 C), Max:99.9 F (37.7 C) Propofol: None   Wife was present during time of visit. She states pt is a brittle diabetic and once appropriate request diabetes education. She states he does not follow a specific diet at home.   Medications reviewed. Labs reviewed.   Diet Order:     Skin:  Reviewed, no issues  Last BM:  PTA  Height:   Ht Readings from Last 1 Encounters:  03/19/16 5\' 11"  (1.803 m)    Weight:   Wt Readings from Last 1 Encounters:  03/23/16 208 lb 1.8 oz (94.4 kg)    Ideal Body Weight:  78.1 kg  BMI:  Body mass index is 29.04 kg/(m^2).  Estimated Nutritional Needs:   Kcal:  [id number]  Protein:  >/= 156 grams  Fluid:  1.1-1.4 L  EDUCATION NEEDS:   No education needs identified at this time  Raford Pitcher, Dietetic Intern Pager: [phone]

## 2016-03-23 NOTE — Procedures (Addendum)
Intubation Procedure Note Julian Ross [id number] [birth year]  Procedure: Intubation Indications: Respiratory insufficiency  Procedure Details Consent: Unable to obtain consent because of altered level of consciousness. Time Out: Verified patient identification, verified procedure, site/side was marked, verified correct patient position, special equipment/implants available, medications/allergies/relevent history reviewed, required imaging and test results available.  Performed  Maximum sterile technique was used including gloves, gown, hand hygiene and mask.  MAC   glidescop used - easy intubation. Neck somewhat lateral extension. Mucus seen around epiglottis but no vomit. Fent 259mcg, versed 4mg , etomidate 20mg  and roc 100mg  given. Cardizem gtt held for procedure. Post procedure - restart precedex gtt. RN instructed to wean cardizem off as tolerated  Evaluation Hemodynamic Status: BP stable throughout; O2 sats: transiently fell during during procedure Patient's Current Condition: stable Complications: No apparent complications Patient did tolerate procedure well. Chest X-ray ordered to verify placement.  CXR: pending.   Ia Leeb 03/23/2016  Dr. Brand Males, M.D., F.C.C.P Pulmonary and Critical Care Medicine Staff Physician [facility] Pager: [phone], If no answer or between  15:00h - 7:00h: call [phone]  03/23/2016 12:41 AM

## 2016-03-24 ENCOUNTER — Inpatient Hospital Stay (HOSPITAL_COMMUNITY): Payer: Medicare Other

## 2016-03-24 LAB — CBC WITH DIFFERENTIAL/PLATELET
BASOS ABS: 0 10*3/uL (ref 0.0–0.1)
BASOS PCT: 0 %
EOS ABS: 0.4 10*3/uL (ref 0.0–0.7)
Eosinophils Relative: 3 %
HEMATOCRIT: 29.9 % — AB (ref 39.0–52.0)
HEMOGLOBIN: 9.8 g/dL — AB (ref 13.0–17.0)
LYMPHS PCT: 13 %
Lymphs Abs: 1.6 10*3/uL (ref 0.7–4.0)
MCH: 30.7 pg (ref 26.0–34.0)
MCHC: 32.8 g/dL (ref 30.0–36.0)
MCV: 93.7 fL (ref 78.0–100.0)
MONOS PCT: 5 %
Monocytes Absolute: 0.6 10*3/uL (ref 0.1–1.0)
NEUTROS ABS: 9.6 10*3/uL — AB (ref 1.7–7.7)
NEUTROS PCT: 79 %
Platelets: 269 10*3/uL (ref 150–400)
RBC: 3.19 MIL/uL — ABNORMAL LOW (ref 4.22–5.81)
RDW: 14.5 % (ref 11.5–15.5)
WBC MORPHOLOGY: INCREASED
WBC: 12.2 10*3/uL — ABNORMAL HIGH (ref 4.0–10.5)

## 2016-03-24 LAB — BASIC METABOLIC PANEL
ANION GAP: 13 (ref 5–15)
BUN: 46 mg/dL — ABNORMAL HIGH (ref 6–20)
CALCIUM: 8.9 mg/dL (ref 8.9–10.3)
CO2: 24 mmol/L (ref 22–32)
Chloride: 112 mmol/L — ABNORMAL HIGH (ref 101–111)
Creatinine, Ser: 1.21 mg/dL (ref 0.61–1.24)
GFR, EST NON AFRICAN AMERICAN: 59 mL/min — AB (ref >60)
Glucose, Bld: 302 mg/dL — ABNORMAL HIGH (ref 65–99)
POTASSIUM: 3.1 mmol/L — AB (ref 3.5–5.1)
SODIUM: 149 mmol/L — AB (ref 135–145)

## 2016-03-24 LAB — HEPARIN LEVEL (UNFRACTIONATED): Heparin Unfractionated: 0.37 IU/mL (ref 0.30–0.70)

## 2016-03-24 LAB — GLUCOSE, CAPILLARY
GLUCOSE-CAPILLARY: 187 mg/dL — AB (ref 65–99)
GLUCOSE-CAPILLARY: 218 mg/dL — AB (ref 65–99)
GLUCOSE-CAPILLARY: 229 mg/dL — AB (ref 65–99)
Glucose-Capillary: 187 mg/dL — ABNORMAL HIGH (ref 65–99)
Glucose-Capillary: 274 mg/dL — ABNORMAL HIGH (ref 65–99)
Glucose-Capillary: 251 mg/dL — ABNORMAL HIGH (ref 65–99)

## 2016-03-24 LAB — PHOSPHORUS
PHOSPHORUS: 2 mg/dL — AB (ref 2.5–4.6)
PHOSPHORUS: 2.1 mg/dL — AB (ref 2.5–4.6)

## 2016-03-24 MED ORDER — FUROSEMIDE 10 MG/ML IJ SOLN
60.0000 mg | Freq: Once | INTRAMUSCULAR | Status: AC
  Administered 2016-03-24: 60 mg via INTRAVENOUS
  Filled 2016-03-24: qty 6

## 2016-03-24 MED ORDER — POTASSIUM CHLORIDE 20 MEQ/15ML (10%) PO SOLN
40.0000 meq | Freq: Once | ORAL | Status: AC
  Administered 2016-03-24: 40 meq
  Filled 2016-03-24: qty 30

## 2016-03-24 MED ORDER — MIDAZOLAM HCL 2 MG/2ML IJ SOLN
1.0000 mg | INTRAMUSCULAR | Status: DC | PRN
  Administered 2016-03-25: 1 mg via INTRAVENOUS

## 2016-03-24 MED ORDER — CHLORHEXIDINE GLUCONATE 0.12% ORAL RINSE (MEDLINE KIT)
15.0000 mL | Freq: Two times a day (BID) | OROMUCOSAL | Status: DC
  Administered 2016-03-24 – 2016-03-25 (×3): 15 mL via OROMUCOSAL

## 2016-03-24 MED ORDER — POTASSIUM CHLORIDE 10 MEQ/100ML IV SOLN
INTRAVENOUS | Status: AC
  Filled 2016-03-24: qty 100

## 2016-03-24 MED ORDER — ONDANSETRON HCL 4 MG/2ML IJ SOLN: 4.0000 mg | Freq: Three times a day (TID) | INTRAMUSCULAR | Status: DC | PRN

## 2016-03-24 MED ORDER — INFLUENZA VAC SPLIT QUAD 0.5 ML IM SUSY
0.5000 mL | PREFILLED_SYRINGE | INTRAMUSCULAR | Status: DC | PRN
  Filled 2016-03-24: qty 0.5

## 2016-03-24 MED ORDER — PNEUMOCOCCAL VAC POLYVALENT 25 MCG/0.5ML IJ INJ: 0.5000 mL | INJECTION | INTRAMUSCULAR | Status: DC | PRN

## 2016-03-24 MED ORDER — FENTANYL CITRATE (PF) 100 MCG/2ML IJ SOLN
50.0000 ug | INTRAMUSCULAR | Status: DC | PRN
  Administered 2016-03-24 – 2016-03-25 (×3): 50 ug via INTRAVENOUS
  Filled 2016-03-24 (×3): qty 2

## 2016-03-24 MED ORDER — POTASSIUM CHLORIDE 10 MEQ/100ML IV SOLN
10.0000 meq | INTRAVENOUS | Status: AC
  Administered 2016-03-24 (×5): 10 meq via INTRAVENOUS
  Filled 2016-03-24 (×4): qty 100

## 2016-03-24 MED ORDER — FENTANYL CITRATE (PF) 100 MCG/2ML IJ SOLN: 50.0000 ug | INTRAMUSCULAR | Status: DC | PRN

## 2016-03-24 MED ORDER — FUROSEMIDE 10 MG/ML IJ SOLN
60.0000 mg | Freq: Every day | INTRAMUSCULAR | Status: DC
  Administered 2016-03-24 – 2016-03-26 (×3): 60 mg via INTRAVENOUS
  Filled 2016-03-24 (×5): qty 6

## 2016-03-24 MED ORDER — ANTISEPTIC ORAL RINSE SOLUTION (CORINZ)
7.0000 mL | Freq: Four times a day (QID) | OROMUCOSAL | Status: DC
  Administered 2016-03-24 – 2016-03-25 (×4): 7 mL via OROMUCOSAL

## 2016-03-24 MED ORDER — MIDAZOLAM HCL 2 MG/2ML IJ SOLN
1.0000 mg | INTRAMUSCULAR | Status: DC | PRN
  Filled 2016-03-24: qty 2

## 2016-03-24 NOTE — Progress Notes (Signed)
PATIENT ID: [id number] with CAD s/p MI and PCI, chronic systolic heart failure, hypertension, hyperlipidemia, and diabetes mellitus who is here with hypoxic respiratory failure and pneumonia, new-onset atrial fibrillation.  INTERVAL HISTORY:  Julian Ross developed atrial fibrillation with rapid ventricular response after being extubated on 3/24.  he was started on a diltiazem infusion rates remained poorly controlled. Yesterday evening metoprolol was started to his rates went as low as the 40s. Diltiazem was discontinued and his rates have remained stable in the 60s to 90s.   SUBJECTIVE:  Patient is intubated and sedated.   PHYSICAL EXAM Filed Vitals:   03/24/16 0434 03/24/16 0500 03/24/16 0600 03/24/16 0700  BP:  101/65 110/72 101/67  Pulse:  86 148 71  Temp:      TempSrc:      Resp:  16 16 16   Height:      Weight: 97.3 kg (214 lb 8.1 oz)     SpO2:  100% 100% 100%   General:  Intubated and sedated. Neck: No JVD   Lungs:  Irregularly irregular. No murmurs, rubs, or gallops.  Heart:  Vent and breath sounds. Otherwise clear anteriorly. Abdomen:  Soft. Active bowel sounds. Extremities:  Warm and well-perfused. No edema. 2+ DP pulses bilaterally.  LABS: Lab Results  Component Value Date   TROPONINI 0.10* 03/19/2016   Results for orders placed or performed during the hospital encounter of 03/18/16 (from the past 24 hour(s))  Glucose, capillary     Status: Abnormal   Collection Time: 03/23/16  8:12 AM  Result Value Ref Range   Glucose-Capillary 183 (H) 65 - 99 mg/dL  Glucose, capillary     Status: Abnormal   Collection Time: 03/23/16 11:29 AM  Result Value Ref Range   Glucose-Capillary 208 (H) 65 - 99 mg/dL  Ammonia     Status: None   Collection Time: 03/23/16  1:33 PM  Result Value Ref Range   Ammonia 27 9 - 35 umol/L  Glucose, capillary     Status: Abnormal   Collection Time: 03/23/16  3:40 PM  Result Value Ref Range   Glucose-Capillary 203 (H) 65 - 99 mg/dL  Magnesium      Status: None   Collection Time: 03/23/16  4:21 PM  Result Value Ref Range   Magnesium 2.0 1.7 - 2.4 mg/dL  Phosphorus     Status: Abnormal   Collection Time: 03/23/16  4:21 PM  Result Value Ref Range   Phosphorus 2.3 (L) 2.5 - 4.6 mg/dL  Glucose, capillary     Status: Abnormal   Collection Time: 03/23/16  8:03 PM  Result Value Ref Range   Glucose-Capillary 207 (H) 65 - 99 mg/dL  Potassium     Status: Abnormal   Collection Time: 03/23/16  9:44 PM  Result Value Ref Range   Potassium 3.3 (L) 3.5 - 5.1 mmol/L  Glucose, capillary     Status: Abnormal   Collection Time: 03/24/16  3:27 AM  Result Value Ref Range   Glucose-Capillary 187 (H) 65 - 99 mg/dL  Heparin level (unfractionated)     Status: None   Collection Time: 03/24/16  4:06 AM  Result Value Ref Range   Heparin Unfractionated 0.37 0.30 - 0.70 IU/mL  CBC with Differential/Platelet     Status: Abnormal   Collection Time: 03/24/16  4:06 AM  Result Value Ref Range   WBC 12.2 (H) 4.0 - 10.5 K/uL   RBC 3.19 (L) 4.22 - 5.81 MIL/uL   Hemoglobin 9.8 (L) 13.0 -  17.0 g/dL   HCT 29.9 (L) 39.0 - 52.0 %   MCV 93.7 78.0 - 100.0 fL   MCH 30.7 26.0 - 34.0 pg   MCHC 32.8 30.0 - 36.0 g/dL   RDW 14.5 11.5 - 15.5 %   Platelets 269 150 - 400 K/uL   Neutrophils Relative % 79 %   Lymphocytes Relative 13 %   Monocytes Relative 5 %   Eosinophils Relative 3 %   Basophils Relative 0 %   Neutro Abs 9.6 (H) 1.7 - 7.7 K/uL   Lymphs Abs 1.6 0.7 - 4.0 K/uL   Monocytes Absolute 0.6 0.1 - 1.0 K/uL   Eosinophils Absolute 0.4 0.0 - 0.7 K/uL   Basophils Absolute 0.0 0.0 - 0.1 K/uL   WBC Morphology INCREASED BANDS (>20% BANDS)     Intake/Output Summary (Last 24 hours) at 03/24/16 0809 Last data filed at 03/24/16 0700  Gross per 24 hour  Intake 1802.15 ml  Output   4075 ml  Net -2272.85 ml    Telemetry: Atrial fibrillationRates from 50s to 140s.   ASSESSMENT AND PLAN:  Principal Problem:   Septic shock ([place]) Active Problems:   CKD (chronic  kidney disease), stage III   Edema leg   Diabetes mellitus type 2 in obese (HCC)   Elevated troponin   Anemia   Acute hypoxemic respiratory failure (HCC)   Altered mental status   Atrial fibrillation, new onset (HCC)   Acute on chronic systolic and diastolic heart failure, NYHA class 1 (HCC)   Acute respiratory failure with hypoxemia (HCC)   AKI (acute kidney injury) ([place])  #  New-onset atrial fibrillation: Julian Ross remains in atrial fibrillation. Rates are well controlled on metoprolol.   continue 12.5 mg every 6 hours.  Continue heparin for anticoagulation.  This patients CHA2DS2-VASc Score and unadjusted Ischemic Stroke Rate (% per year) is equal to 4.8 % stroke rate/year from a score of 4.  He will require long-term anticoagulation. However, we will wait to start oral anticoagulation until after he is also present and it is clear that he will not require any procedures.   Above score calculated as 1 point each if present [CHF, HTN, DM, Vascular=MI/PAD/Aortic Plaque, Age if 65-74, or Male] Above score calculated as 2 points each if present [Age > 75, or Stroke/TIA/TE]  # Chronic systolic and diastolic heart failure: Julian Ross has known systolic dysfunction with an ejection fraction of 45% on echo in 2008. We do not have any additional echos in our system and are currently awaiting his outside data. Echo this admission revealed an EF of 30-35% with focal wall motion abnormalities. Will start lasix 60 mg IV daily.  Renal function for today is still pending. Overall his renal function has been improving.   # CAD: Julian Ross has an extensive history of CAD with up to 16 prior stents.  Troponin was mildly elevated at 0.13.  EKG has not been concerning for active ischemia.  He has had several episodes of ventricular ectopy. This seems to have improved on metoprolol.  Continue home Plavix and heparin.  He is also not on a statin.  LDL 67.  However, given his CAD history he should still be on at  least a low-dose statin.  Presumably statin intolerant.      Barnie Sopko C. [name], MD, [facility] 03/24/2016 8:09 AM

## 2016-03-24 NOTE — Progress Notes (Signed)
PULMONARY / CRITICAL CARE MEDICINE   Name: Julian Ross MRN: [id number] DOB: 10/31/1946    ADMISSION DATE:  03/18/2016 CONSULTATION DATE:  03/19/16  REFERRING MD:  Fuller Plan  CHIEF COMPLAINT:  Shortness of breath, confusion  SUBJECTIVE:   Calm HR under better control Loletha Grayer on dilt and precedex Remains on metoprolol  VITAL SIGNS: BP 122/71 mmHg  Pulse 80  Temp(Src) 98.9 F (37.2 C) (Oral)  Resp 16  Ht 5\' 11"  (1.803 m)  Wt 97.3 kg (214 lb 8.1 oz)  BMI 29.93 kg/m2  SpO2 99%  HEMODYNAMICS:    VENTILATOR SETTINGS: Vent Mode:  [-] PRVC FiO2 (%):  [40 %-50 %] 40 % Set Rate:  [16 bmp] 16 bmp Vt Set:  [600 mL] 600 mL PEEP:  [5 cmH20] 5 cmH20 Plateau Pressure:  [18 cmH20-26 cmH20] 18 cmH20  INTAKE / OUTPUT: I/O last 3 completed shifts: In: 3025.6 [I.V.:1329.6; Other:300; NG/GT:380; IV Piggyback:1016] Out: [id number] [Urine:5205; Emesis/NG output:350]  PHYSICAL EXAMINATION: General: awakens to voice Neuro: wakes up, focuses on me, nods head, answers questions, no tremor again today CV: irreg irreg , no mgr PULM: few crackles bases, vent supported breaths GI: soft, nontender, no masses Extremities: no edema, warm, acyanotic  Neuro: opens eyes to voice, follows commands, lifts hands spontaneously, to command  LABS:  BMET  Recent Labs Lab 03/21/16 0254 03/22/16 0835 03/23/16 0325 03/23/16 2144  NA 135 144 147*  --   K 3.9 3.5 3.4* 3.3*  CL 107 110 112*  --   CO2 22 24 25   --   BUN 38* 41* 32*  --   CREATININE 1.54* 1.36* 1.32*  --   GLUCOSE 208* 231* 205*  --     Electrolytes  Recent Labs Lab 03/19/16 0622  03/20/16 0350 03/21/16 0254 03/22/16 0835 03/22/16 1900 03/23/16 0325 03/23/16 1621  CALCIUM 8.2*  < > 8.2* 8.3* 8.4*  --  8.4*  --   MG 1.8  --  2.2  --   --  2.1  --  2.0  PHOS 3.3  --   --   --   --   --   --  2.3*  < > = values in this interval not displayed.  CBC  Recent Labs Lab 03/22/16 0320 03/23/16 0325 03/24/16 0406  WBC 9.2  11.9* 12.2*  HGB 10.1* 9.6* 9.8*  HCT 30.1* 29.9* 29.9*  PLT 256 269 269    Coag's  Recent Labs Lab 03/18/16 1607  APTT 33  INR 1.29    Sepsis Markers  Recent Labs Lab 03/18/16 2034 03/18/16 2050 03/19/16 0011 03/19/16 1531  LATICACIDVEN  --  2.6*  2.60* 3.9* 1.4  PROCALCITON 1.23  --   --   --     ABG  Recent Labs Lab 03/20/16 1159 03/22/16 2041 03/23/16 0200  PHART 7.229* 7.364 7.344*  PCO2ART 50.0* 44.4 48.1*  PO2ART 57.0* 67.0* 342.0*    Liver Enzymes  Recent Labs Lab 03/18/16 1607 03/19/16 0338 03/20/16 0350  AST 37 76* 52*  ALT 19 27 26   ALKPHOS 42 40 37*  BILITOT 0.7 0.7 0.8  ALBUMIN 2.8* 2.8* 2.3*    Cardiac Enzymes  Recent Labs Lab 03/18/16 2034 03/19/16 0010 03/19/16 0217  TROPONINI 0.13* 0.11* 0.10*    Glucose  Recent Labs Lab 03/23/16 1129 03/23/16 1540 03/23/16 2003 03/23/16 2336 03/24/16 0327 03/24/16 0811  GLUCAP 208* 203* 207* 187* 187* 218*    Imaging 3/25 CXR images reviewed> persistent diffuse hazy opacities which worsened  since his pre-extubation chest X rays  STUDIES:  CT Head 3/19 >> no acute process, chronic atrophy & small vessl disease ECHO 3/20 >> LVEF 30-35%, global hypokinesis, PA estimate 37mm Hg EEG 3/21 > slowing but no seizure EEG 3/22> no seizure 3/24 CT head > chronic microvascular change, nothing acute  CULTURES: Blood cx 3/19 >> RVP 3/19 >> neg Flu 3/19 >> neg   ANTIBIOTICS: Vanc 3/19 >> 3/22 Aztreonam 3/19 >> 10 days Levaquin 3/19 >> 3/22  SIGNIFICANT EVENTS: 3/19  Admit with suspected PNA, hypoxic respiratory failure  LINES/TUBES: ETT 3/20 > 3/23 Foley 3/20  3/24 CXR > worsenign bilateral airspace disease  DISCUSSION: Mr. Gere is a 66 M with acute hypoxemic respiratory failure requiring mechanical ventilation. He has a significant cardiac history with prior MI and prior stenting, RA on methotrexate/pred. He had HCAP and AKI on admission, both improving but has persistent  tremors felt to be due to myoclonus.  Has new Afib and congestive heart failure.  ASSESSMENT / PLAN:  PULMONARY A: Acute hypoxemic respiratory failure - intially due to pneumonia, then last night from acute pulmonary edema HCAP - immunocompromised with RA OSA - on CPAP, ? compliance P:   Pressure support as tolerated today Diurese x2 doses today VAP prevention bundle Wean PEEP / FiO2 for sats > 92%  Will need CPAP QHS post extubation    CARDIOVASCULAR A:  New onset A fib - improved control Chronic systolic CHF (LVEF 99991111) acute decompensation overnight Mild troponin leak, demand ischemia Hx MI, CAD s/p PCI - reportedly has 16 stents per wife Hx ICM  Bradycardia with dilt and precedex P:  Monitor HR closely on pressure support today May need restart dilt Don't run dilt and precedex together as he had bradycardia Heparin infusion Continue BIDIL ICU monitoring of hemodynamics Continue plavix  Continue cozaar  Continue metoprolol per cardiology, appreciate their help  RENAL A:   Acute on chronic kidney failure > better CKD III P:   Renal dose meds Monitor BMET and UOP Replace electrolytes as needed  GASTROINTESTINAL A:   GERD   P:   Stress ulcer ppx Tube feedings to continue  HEMATOLOGIC A:   Anemia - suspect element of chronic disease P:  Monitor CBC Lovenox for DVT prophylaxis   INFECTIOUS A:   Severe sepsis secondary to pneumonia > improving P:   Continue aztreonam 10 days   AUTOIMMUNE:  A:  RA -  on plaquenil / prednisone 5mg  QD P:  Hold home plaquenil Restart prednisone 10mg , then drop to 5mg  when he goes home   ENDOCRINE A:   DMII P:   Accuchecks and SSI   NEUROLOGIC A:   Acute encephalopathy from home polypharmacy, ICU delirium > improving but slow to wake up Recurrent tremors> myoclonus per neurology, resolved Anxiety - uses xanax at home, family reports polypharmacy Diabetic Neuropathy  Essential Tremor  Partial Blindness   Hx CVA  P:   Continue home meds (gabapentin, paxil, topamax) Monitor for benzo withdrawal  Fentanyl prn Versed prn Precedex > only if uncontrolled on prn RASS goal 0  FAMILY  - Updates:  Wife updated extensively at bedside 3/24.  Updated son 3/22 (physician)    - Inter-disciplinary family meet or Palliative Care meeting due by:  3/27  My cc time 35 minutes  Roselie Awkward, MD [place] [facility] Pager: [phone] Cell: [phone] After 3pm or if no response, call [phone]

## 2016-03-24 NOTE — Progress Notes (Signed)
[facility] Progress Note Patient Name: Julian Ross DOB: December 31, 1946 MRN: [id number]   Date of Service  03/24/2016  HPI/Events of Note  hypokalemia  eICU Interventions  replaced     Intervention Category Minor Interventions: Electrolytes abnormality - evaluation and management  Mauri [place], P 03/24/2016, 5:58 PM

## 2016-03-24 NOTE — Progress Notes (Signed)
ANTICOAGULATION CONSULT NOTE - Follow Up Consult  Pharmacy Consult for heparin  Indication: atrial fibrillation  Allergies  Allergen Reactions  . Ace Inhibitors Cough  . Atenolol Other (See Comments)    Unknown reaction  . Contrast Media [Iodinated Diagnostic Agents] Rash  . Penicillins Hives and Rash    Has patient had a PCN reaction causing immediate rash, facial/tongue/throat swelling, SOB or lightheadedness with hypotension: Yes Has patient had a PCN reaction causing severe rash involving mucus membranes or skin necrosis: No Has patient had a PCN reaction that required hospitalization pt was in the hospital at time of last reaction - heart attack Has patient had a PCN reaction occurring within the last 10 years: No If all of the above answers are "NO", then may proceed with Cephalosporin use.    Patient Measurements: Height: 5\' 11"  (180.3 cm) Weight: 214 lb 8.1 oz (97.3 kg) IBW/kg (Calculated) : 75.3  Vital Signs: Temp: 98.9 F (37.2 C) (03/25 0809) Temp Source: Oral (03/25 0809) BP: 132/82 mmHg (03/25 1000) Pulse Rate: 69 (03/25 1000)  Labs:  Recent Labs  03/22/16 0320 03/22/16 0835  03/22/16 1550 03/23/16 0325 03/23/16 0425 03/24/16 0406  HGB 10.1*  --   --   --  9.6*  --  9.8*  HCT 30.1*  --   --   --  29.9*  --  29.9*  PLT 256  --   --   --  269  --  269  HEPARINUNFRC 0.22*  --   < > 0.48  --  0.42 0.37  CREATININE  --  1.36*  --   --  1.32*  --   --   < > = values in this interval not displayed.  Estimated Creatinine Clearance: 62.8 mL/min (by C-G formula based on Cr of 1.32).  Assessment: 70 yo M on heparin for afib. Heparin level remains therapeutic at 0.38 on Heparin gtt at 1850 units/hr. CBC stable. No issues with line or bleeding reported per RN.  Goal of Therapy:  Heparin level 0.3-0.7 units/ml Monitor platelets by anticoagulation protocol: Yes   Plan:  Continue Heparin infusion at 1850 units/hr Daily HL/CBC Monitor s/sx bleeding  Legrand Como, Pharm.D., BCPS, AAHIVP Clinical Pharmacist Phone: [phone] or [phone] 03/24/2016, 10:21 AM

## 2016-03-25 ENCOUNTER — Inpatient Hospital Stay (HOSPITAL_COMMUNITY): Payer: Medicare Other

## 2016-03-25 LAB — CBC WITH DIFFERENTIAL/PLATELET
BASOS ABS: 0 10*3/uL (ref 0.0–0.1)
Basophils Relative: 0 %
EOS ABS: 0.2 10*3/uL (ref 0.0–0.7)
Eosinophils Relative: 2 %
HEMATOCRIT: 30.2 % — AB (ref 39.0–52.0)
Hemoglobin: 9.4 g/dL — ABNORMAL LOW (ref 13.0–17.0)
LYMPHS ABS: 1.2 10*3/uL (ref 0.7–4.0)
Lymphocytes Relative: 10 %
MCH: 29.2 pg (ref 26.0–34.0)
MCHC: 31.1 g/dL (ref 30.0–36.0)
MCV: 93.8 fL (ref 78.0–100.0)
MONO ABS: 1.2 10*3/uL — AB (ref 0.1–1.0)
Monocytes Relative: 10 %
NEUTROS ABS: 9.4 10*3/uL — AB (ref 1.7–7.7)
Neutrophils Relative %: 78 %
PLATELETS: 334 10*3/uL (ref 150–400)
RBC: 3.22 MIL/uL — ABNORMAL LOW (ref 4.22–5.81)
RDW: 14.5 % (ref 11.5–15.5)
WBC: 12 10*3/uL — AB (ref 4.0–10.5)

## 2016-03-25 LAB — MAGNESIUM: MAGNESIUM: 2.1 mg/dL (ref 1.7–2.4)

## 2016-03-25 LAB — BASIC METABOLIC PANEL
Anion gap: 10 (ref 5–15)
Anion gap: 13 (ref 5–15)
BUN: 41 mg/dL — AB (ref 6–20)
BUN: 36 mg/dL — AB (ref 6–20)
CALCIUM: 8.8 mg/dL — AB (ref 8.9–10.3)
CHLORIDE: 109 mmol/L (ref 101–111)
CO2: 27 mmol/L (ref 22–32)
CO2: 27 mmol/L (ref 22–32)
CREATININE: 1.13 mg/dL (ref 0.61–1.24)
Calcium: 9.3 mg/dL (ref 8.9–10.3)
Chloride: 112 mmol/L — ABNORMAL HIGH (ref 101–111)
Creatinine, Ser: 1.03 mg/dL (ref 0.61–1.24)
GFR calc Af Amer: >60 mL/min (ref >60)
GFR calc Af Amer: >60 mL/min (ref >60)
GFR calc non Af Amer: >60 mL/min (ref >60)
GLUCOSE: 261 mg/dL — AB (ref 65–99)
Glucose, Bld: 183 mg/dL — ABNORMAL HIGH (ref 65–99)
POTASSIUM: 3.7 mmol/L (ref 3.5–5.1)
Potassium: 2.9 mmol/L — ABNORMAL LOW (ref 3.5–5.1)
SODIUM: 149 mmol/L — AB (ref 135–145)
SODIUM: 149 mmol/L — AB (ref 135–145)

## 2016-03-25 LAB — GLUCOSE, CAPILLARY
GLUCOSE-CAPILLARY: 208 mg/dL — AB (ref 65–99)
GLUCOSE-CAPILLARY: 255 mg/dL — AB (ref 65–99)
GLUCOSE-CAPILLARY: 174 mg/dL — AB (ref 65–99)
GLUCOSE-CAPILLARY: 153 mg/dL — AB (ref 65–99)
Glucose-Capillary: 270 mg/dL — ABNORMAL HIGH (ref 65–99)
Glucose-Capillary: 201 mg/dL — ABNORMAL HIGH (ref 65–99)

## 2016-03-25 LAB — HEPARIN LEVEL (UNFRACTIONATED)
HEPARIN UNFRACTIONATED: 0.24 [IU]/mL — AB (ref 0.30–0.70)
Heparin Unfractionated: 0.5 IU/mL (ref 0.30–0.70)
Heparin Unfractionated: 0.52 IU/mL (ref 0.30–0.70)

## 2016-03-25 LAB — PHOSPHORUS: PHOSPHORUS: 1.5 mg/dL — AB (ref 2.5–4.6)

## 2016-03-25 MED ORDER — GUAIFENESIN 200 MG PO TABS
300.0000 mg | ORAL_TABLET | ORAL | Status: DC | PRN
  Filled 2016-03-25: qty 1.5

## 2016-03-25 MED ORDER — POTASSIUM CHLORIDE 10 MEQ/100ML IV SOLN
10.0000 meq | INTRAVENOUS | Status: DC
  Administered 2016-03-25: 10 meq via INTRAVENOUS
  Filled 2016-03-25: qty 100

## 2016-03-25 MED ORDER — METOPROLOL TARTRATE 25 MG/10 ML ORAL SUSPENSION
25.0000 mg | Freq: Four times a day (QID) | ORAL | Status: DC
  Administered 2016-03-25: 25 mg
  Filled 2016-03-25 (×4): qty 10

## 2016-03-25 MED ORDER — FENTANYL CITRATE (PF) 100 MCG/2ML IJ SOLN
12.5000 ug | INTRAMUSCULAR | Status: DC | PRN
  Administered 2016-03-25 – 2016-03-26 (×2): 25 ug via INTRAVENOUS
  Filled 2016-03-25 (×2): qty 2

## 2016-03-25 MED ORDER — POTASSIUM CHLORIDE 20 MEQ/15ML (10%) PO SOLN
40.0000 meq | Freq: Once | ORAL | Status: AC
  Administered 2016-03-25: 40 meq
  Filled 2016-03-25: qty 30

## 2016-03-25 MED ORDER — METOPROLOL TARTRATE 25 MG PO TABS
25.0000 mg | ORAL_TABLET | Freq: Two times a day (BID) | ORAL | Status: DC
  Administered 2016-03-26: 25 mg via ORAL
  Filled 2016-03-25 (×3): qty 1

## 2016-03-25 MED ORDER — CETYLPYRIDINIUM CHLORIDE 0.05 % MT LIQD
7.0000 mL | Freq: Two times a day (BID) | OROMUCOSAL | Status: DC
  Administered 2016-03-25 – 2016-04-02 (×15): 7 mL via OROMUCOSAL

## 2016-03-25 MED ORDER — FUROSEMIDE 10 MG/ML IJ SOLN
60.0000 mg | Freq: Once | INTRAMUSCULAR | Status: AC
  Administered 2016-03-25: 60 mg via INTRAVENOUS
  Filled 2016-03-25: qty 6

## 2016-03-25 MED ORDER — POTASSIUM CHLORIDE 10 MEQ/100ML IV SOLN
10.0000 meq | INTRAVENOUS | Status: AC
  Administered 2016-03-25 (×7): 10 meq via INTRAVENOUS
  Filled 2016-03-25 (×7): qty 100

## 2016-03-25 MED ORDER — CHLORHEXIDINE GLUCONATE 0.12 % MT SOLN: 15.0000 mL | Freq: Two times a day (BID) | OROMUCOSAL | Status: DC

## 2016-03-25 MED ORDER — CETYLPYRIDINIUM CHLORIDE 0.05 % MT LIQD
7.0000 mL | Freq: Two times a day (BID) | OROMUCOSAL | Status: DC
  Administered 2016-03-25: 7 mL via OROMUCOSAL

## 2016-03-25 MED ORDER — GUAIFENESIN 200 MG PO TABS
200.0000 mg | ORAL_TABLET | ORAL | Status: DC | PRN
  Administered 2016-03-26 – 2016-03-27 (×2): 200 mg via ORAL
  Filled 2016-03-25 (×3): qty 1

## 2016-03-25 NOTE — Progress Notes (Signed)
Pt placed on CPAP 13 cm H2O via FFM per home use. Pt tolerating well at this time.

## 2016-03-25 NOTE — Progress Notes (Signed)
PULMONARY / CRITICAL CARE MEDICINE   Name: Reshawn Ghattas MRN: [id number] DOB: 03/13/1946    ADMISSION DATE:  03/18/2016 CONSULTATION DATE:  03/19/16  REFERRING MD:  Fuller Plan  CHIEF COMPLAINT:  Shortness of breath, confusion  SUBJECTIVE:   HR better controlled today Awake, alert, wants tube out Trying to talk Diuresed  VITAL SIGNS: BP 146/92 mmHg  Pulse 91  Temp(Src) 99.4 F (37.4 C) (Oral)  Resp 19  Ht 5\' 11"  (1.803 m)  Wt 99.3 kg (218 lb 14.7 oz)  BMI 30.55 kg/m2  SpO2 100%  HEMODYNAMICS:    VENTILATOR SETTINGS: Vent Mode:  [-] PSV;CPAP FiO2 (%):  [40 %] 40 % Set Rate:  [16 bmp] 16 bmp Vt Set:  [600 mL] 600 mL PEEP:  [5 cmH20] 5 cmH20 Pressure Support:  [5 cmH20-10 cmH20] 5 cmH20 Plateau Pressure:  [19 cmH20-23 cmH20] 23 cmH20  INTAKE / OUTPUT: I/O last 3 completed shifts: In: 4661.9 [I.V.:1200.9; NG/GT:2195; IV Piggyback:1266] Out: [id number]  PHYSICAL EXAMINATION: General: awake on vent Neuro: wakes up, following commands, trying to talk CV: irreg irreg , no mgr PULM: few crackles bases, vent supported breaths GI: soft, nontender, no masses Extremities: no edema, warm, acyanotic   LABS:  BMET  Recent Labs Lab 03/23/16 0325 03/23/16 2144 03/24/16 1435 03/25/16 0300  NA 147*  --  149* 149*  K 3.4* 3.3* 3.1* 2.9*  CL 112*  --  112* 112*  CO2 25  --  24 27  BUN 32*  --  46* 41*  CREATININE 1.32*  --  1.21 1.13  GLUCOSE 205*  --  302* 261*    Electrolytes  Recent Labs Lab 03/22/16 1900 03/23/16 0325 03/23/16 1621 03/24/16 1435 03/24/16 2008 03/25/16 0300 03/25/16 0805  CALCIUM  --  8.4*  --  8.9  --  8.8*  --   MG 2.1  --  2.0  --   --   --  2.1  PHOS  --   --  2.3* 2.0* 2.1*  --  1.5*    CBC  Recent Labs Lab 03/23/16 0325 03/24/16 0406 03/25/16 0300  WBC 11.9* 12.2* 12.0*  HGB 9.6* 9.8* 9.4*  HCT 29.9* 29.9* 30.2*  PLT 269 269 334    Coag's  Recent Labs Lab 03/18/16 1607  APTT 33  INR 1.29     Sepsis Markers  Recent Labs Lab 03/18/16 2034 03/18/16 2050 03/19/16 0011 03/19/16 1531  LATICACIDVEN  --  2.6*  2.60* 3.9* 1.4  PROCALCITON 1.23  --   --   --     ABG  Recent Labs Lab 03/20/16 1159 03/22/16 2041 03/23/16 0200  PHART 7.229* 7.364 7.344*  PCO2ART 50.0* 44.4 48.1*  PO2ART 57.0* 67.0* 342.0*    Liver Enzymes  Recent Labs Lab 03/18/16 1607 03/19/16 0338 03/20/16 0350  AST 37 76* 52*  ALT 19 27 26   ALKPHOS 42 40 37*  BILITOT 0.7 0.7 0.8  ALBUMIN 2.8* 2.8* 2.3*    Cardiac Enzymes  Recent Labs Lab 03/18/16 2034 03/19/16 0010 03/19/16 0217  TROPONINI 0.13* 0.11* 0.10*    Glucose  Recent Labs Lab 03/24/16 1216 03/24/16 1618 03/24/16 1935 03/25/16 0012 03/25/16 0428 03/25/16 0811  GLUCAP 274* 251* 229* 270* 208* 201*    Imaging 3/25 CXR images reviewed> persistent diffuse hazy opacities which worsened since his pre-extubation chest X rays  STUDIES:  CT Head 3/19 >> no acute process, chronic atrophy & small vessl disease ECHO 3/20 >> LVEF 30-35%, global hypokinesis, PA  estimate 30mm Hg EEG 3/21 > slowing but no seizure EEG 3/22> no seizure 3/24 CT head > chronic microvascular change, nothing acute  CULTURES: Blood cx 3/19 >> RVP 3/19 >> neg Flu 3/19 >> neg   ANTIBIOTICS: Vanc 3/19 >> 3/22 Aztreonam 3/19 >> 10 days Levaquin 3/19 >> 3/22  SIGNIFICANT EVENTS: 3/19  Admit with suspected PNA, hypoxic respiratory failure  LINES/TUBES: ETT 3/20 > 3/23 Foley 3/20  3/24 CXR > worsenign bilateral airspace disease  DISCUSSION: Mr. Kingman is a 89 M with acute hypoxemic respiratory failure requiring mechanical ventilation. He has a significant cardiac history with prior MI and prior stenting, RA on methotrexate/pred. He had HCAP and AKI on admission, both improving but has persistent tremors felt to be due to myoclonus.  Has new Afib and congestive heart failure, failed extubation 3/23 due to CHF in setting of Afib with RVR.    ASSESSMENT / PLAN:  PULMONARY A: Acute hypoxemic respiratory failure - improved HCAP - immunocompromised with RA OSA - on CPAP, ? compliance P:   Extubate today Diurese x2 doses today Will need CPAP QHS post extubation    CARDIOVASCULAR A:  New onset A fib - improved control Chronic systolic CHF (LVEF 99991111) > improved 3/26 Mild troponin leak, demand ischemia Hx MI, CAD s/p PCI - reportedly has 16 stents per wife Hx ICM  Bradycardia with dilt and precedex P:  Continue restart dilt Heparin infusion Continue BIDIL ICU monitoring of hemodynamics Continue plavix  Continue cozaar  Continue metoprolol per cardiology, appreciate their help  RENAL A:   Acute on chronic kidney failure > better CKD III Hypokalemia P:   Renal dose meds Monitor BMET and UOP Replace electrolytes as needed  GASTROINTESTINAL A:   GERD   P:   Stress ulcer ppx Hold tube feedings SLP eval post extubation  HEMATOLOGIC A:   Anemia - suspect element of chronic disease P:  Monitor CBC Lovenox for DVT prophylaxis   INFECTIOUS A:   Severe sepsis secondary to pneumonia > improving P:   Continue aztreonam 10 days   AUTOIMMUNE:  A:  RA -  on plaquenil / prednisone 5mg  QD P:  Hold home plaquenil Restart prednisone 10mg , then drop to 5mg  when he goes home   ENDOCRINE A:   DMII P:   Accuchecks and SSI   NEUROLOGIC A:   Acute encephalopathy from home polypharmacy, ICU delirium > improved significantly Recurrent tremors> myoclonus per neurology, resolved Anxiety - uses xanax at home, family reports polypharmacy Diabetic Neuropathy  Essential Tremor  Partial Blindness  Hx CVA  P:   Continue home meds (gabapentin, paxil, topamax) Fentanyl prn   FAMILY  - Updates:  Wife updated extensively at bedside 3/24.  Updated son 3/22 (physician)    - Inter-disciplinary family meet or Palliative Care meeting due by:  3/27  My cc time 33 minutes  Roselie Awkward, MD [place]  PCCM Pager: [phone] Cell: [phone] After 3pm or if no response, call [phone]

## 2016-03-25 NOTE — Progress Notes (Signed)
PATIENT ID: [id number] with CAD s/p MI and PCI, chronic systolic heart failure, hypertension, hyperlipidemia, and diabetes mellitus who is here with hypoxic respiratory failure and pneumonia, new-onset atrial fibrillation.  INTERVAL HISTORY:  Heart rate fairly well-controlled yesterday. He did have some episodes of tachycardia so diltiazem was restarted. Sedation has been weaned and the plan is for extubation today.  SUBJECTIVE:  Denies chest pain. Denies any history of atrial fibrillation.  PHYSICAL EXAM Filed Vitals:   03/25/16 0715 03/25/16 0717 03/25/16 0800 03/25/16 0814  BP: 137/59  162/88   Pulse: 68  109   Temp:    99.4 F (37.4 C)  TempSrc:    Oral  Resp: 16  17   Height:      Weight:      SpO2: 100% 100% 100%    General:  Intubated and sedated. Neck: No JVD   Lungs:  Irregularly irregular. No murmurs, rubs, or gallops.  Heart:  Vent and breath sounds. Otherwise clear anteriorly. Abdomen:  Soft. Active bowel sounds. Extremities:  Warm and well-perfused. No edema. 2+ DP pulses bilaterally.  LABS: Lab Results  Component Value Date   TROPONINI 0.10* 03/19/2016   Results for orders placed or performed during the hospital encounter of 03/18/16 (from the past 24 hour(s))  Glucose, capillary     Status: Abnormal   Collection Time: 03/24/16 12:16 PM  Result Value Ref Range   Glucose-Capillary 274 (H) 65 - 99 mg/dL  Phosphorus     Status: Abnormal   Collection Time: 03/24/16  2:35 PM  Result Value Ref Range   Phosphorus 2.0 (L) 2.5 - 4.6 mg/dL  Basic metabolic panel     Status: Abnormal   Collection Time: 03/24/16  2:35 PM  Result Value Ref Range   Sodium 149 (H) 135 - 145 mmol/L   Potassium 3.1 (L) 3.5 - 5.1 mmol/L   Chloride 112 (H) 101 - 111 mmol/L   CO2 24 22 - 32 mmol/L   Glucose, Bld 302 (H) 65 - 99 mg/dL   BUN 46 (H) 6 - 20 mg/dL   Creatinine, Ser 1.21 0.61 - 1.24 mg/dL   Calcium 8.9 8.9 - 10.3 mg/dL   GFR calc non Af Amer 59 (L) >60 mL/min   GFR calc Af Amer  >60 >60 mL/min   Anion gap 13 5 - 15  Glucose, capillary     Status: Abnormal   Collection Time: 03/24/16  4:18 PM  Result Value Ref Range   Glucose-Capillary 251 (H) 65 - 99 mg/dL  Glucose, capillary     Status: Abnormal   Collection Time: 03/24/16  7:35 PM  Result Value Ref Range   Glucose-Capillary 229 (H) 65 - 99 mg/dL   Comment 1 Notify RN   Phosphorus     Status: Abnormal   Collection Time: 03/24/16  8:08 PM  Result Value Ref Range   Phosphorus 2.1 (L) 2.5 - 4.6 mg/dL  Glucose, capillary     Status: Abnormal   Collection Time: 03/25/16 12:12 AM  Result Value Ref Range   Glucose-Capillary 270 (H) 65 - 99 mg/dL   Comment 1 Notify RN   Heparin level (unfractionated)     Status: Abnormal   Collection Time: 03/25/16  3:00 AM  Result Value Ref Range   Heparin Unfractionated 0.24 (L) 0.30 - 0.70 IU/mL  Basic metabolic panel     Status: Abnormal   Collection Time: 03/25/16  3:00 AM  Result Value Ref Range   Sodium 149 (H)  135 - 145 mmol/L   Potassium 2.9 (L) 3.5 - 5.1 mmol/L   Chloride 112 (H) 101 - 111 mmol/L   CO2 27 22 - 32 mmol/L   Glucose, Bld 261 (H) 65 - 99 mg/dL   BUN 41 (H) 6 - 20 mg/dL   Creatinine, Ser 1.13 0.61 - 1.24 mg/dL   Calcium 8.8 (L) 8.9 - 10.3 mg/dL   GFR calc non Af Amer >60 >60 mL/min   GFR calc Af Amer >60 >60 mL/min   Anion gap 10 5 - 15  CBC with Differential/Platelet     Status: Abnormal   Collection Time: 03/25/16  3:00 AM  Result Value Ref Range   WBC 12.0 (H) 4.0 - 10.5 K/uL   RBC 3.22 (L) 4.22 - 5.81 MIL/uL   Hemoglobin 9.4 (L) 13.0 - 17.0 g/dL   HCT 30.2 (L) 39.0 - 52.0 %   MCV 93.8 78.0 - 100.0 fL   MCH 29.2 26.0 - 34.0 pg   MCHC 31.1 30.0 - 36.0 g/dL   RDW 14.5 11.5 - 15.5 %   Platelets 334 150 - 400 K/uL   Neutrophils Relative % 78 %   Lymphocytes Relative 10 %   Monocytes Relative 10 %   Eosinophils Relative 2 %   Basophils Relative 0 %   Neutro Abs 9.4 (H) 1.7 - 7.7 K/uL   Lymphs Abs 1.2 0.7 - 4.0 K/uL   Monocytes Absolute  1.2 (H) 0.1 - 1.0 K/uL   Eosinophils Absolute 0.2 0.0 - 0.7 K/uL   Basophils Absolute 0.0 0.0 - 0.1 K/uL   WBC Morphology TOXIC GRANULATION   Glucose, capillary     Status: Abnormal   Collection Time: 03/25/16  4:28 AM  Result Value Ref Range   Glucose-Capillary 208 (H) 65 - 99 mg/dL    Intake/Output Summary (Last 24 hours) at 03/25/16 0904 Last data filed at 03/25/16 [id number]  Gross per 24 hour  Intake 3519.72 ml  Output   5175 ml  Net -1655.28 ml    Telemetry: Atrial fibrillationRates from 50s to 140s.   ASSESSMENT AND PLAN:  Principal Problem:   Septic shock ([place]) Active Problems:   CKD (chronic kidney disease), stage III   Edema leg   Diabetes mellitus type 2 in obese (HCC)   Elevated troponin   Anemia   Acute hypoxemic respiratory failure (HCC)   Altered mental status   Atrial fibrillation, new onset (HCC)   Acute on chronic systolic and diastolic heart failure, NYHA class 1 (HCC)   Acute respiratory failure with hypoxemia (HCC)   AKI (acute kidney injury) ([place])   Acute respiratory failure with hypoxia ([place])  #  New-onset atrial fibrillation: Mr. Cerbone remains in atrial fibrillation. Rates increased some yesterday so diltiazem was restarted.  Will increase metoprolol to 25 mg q6h.  Hopefully this will help wean the diltiazem infusion.  Ideally, he won't need long-term diltiazem given his low EF.  Continue heparin for anticoagulation.  This patients CHA2DS2-VASc Score and unadjusted Ischemic Stroke Rate (% per year) is equal to 4.8 % stroke rate/year from a score of 4.  He will require long-term anticoagulation. However, we will wait to start oral anticoagulation until after he is also present and it is clear that he will not require any procedures.   Above score calculated as 1 point each if present [CHF, HTN, DM, Vascular=MI/PAD/Aortic Plaque, Age if 65-74, or Male] Above score calculated as 2 points each if present [Age > 75, or Stroke/TIA/TE]  #  Chronic systolic and  diastolic heart failure: Mr. Sestito has known systolic dysfunction with an ejection fraction of 45% on echo in 2008. We do not have any additional echos in our system and are currently awaiting his outside data. Echo this admission revealed an EF of 30-35% with focal wall motion abnormalities. He has maintained a negative fluid balance with lasix 60 mg IV daily.  Renal function stable.    # CAD: Mr. Brancato has an extensive history of CAD with up to 16 prior stents.  Troponin was mildly elevated at 0.13.  EKG has not been concerning for active ischemia.  He has had several episodes of ventricular ectopy. This seems to have improved on metoprolol.  Continue home Plavix and heparin.  He is also not on a statin.  LDL 67.  However, given his CAD history he should still be on at least a low-dose statin.  Presumably statin intolerant.  Will discuss when extubated.  Stress vs. Cath when stable.     Coye Dawood C. [name], MD, [facility] 03/25/2016 9:04 AM

## 2016-03-25 NOTE — Progress Notes (Signed)
ANTICOAGULATION CONSULT NOTE - Follow Up Consult  Pharmacy Consult for heparin  Indication: atrial fibrillation  Allergies  Allergen Reactions  . Ace Inhibitors Cough  . Atenolol Other (See Comments)    Unknown reaction  . Contrast Media [Iodinated Diagnostic Agents] Rash  . Penicillins Hives and Rash    Has patient had a PCN reaction causing immediate rash, facial/tongue/throat swelling, SOB or lightheadedness with hypotension: Yes Has patient had a PCN reaction causing severe rash involving mucus membranes or skin necrosis: No Has patient had a PCN reaction that required hospitalization pt was in the hospital at time of last reaction - heart attack Has patient had a PCN reaction occurring within the last 10 years: No If all of the above answers are "NO", then may proceed with Cephalosporin use.    Patient Measurements: Height: 5\' 11"  (180.3 cm) Weight: 218 lb 14.7 oz (99.3 kg) IBW/kg (Calculated) : 75.3  Vital Signs: Temp: 98.7 F (37.1 C) (03/26 1623) Temp Source: Oral (03/26 1623) BP: 144/84 mmHg (03/26 1700) Pulse Rate: 85 (03/26 1700)  Labs:  Recent Labs  03/23/16 0325  03/24/16 0406 03/24/16 1435 03/25/16 0300 03/25/16 1025 03/25/16 1601  HGB 9.6*  --  9.8*  --  9.4*  --   --   HCT 29.9*  --  29.9*  --  30.2*  --   --   PLT 269  --  269  --  334  --   --   HEPARINUNFRC  --   < > 0.37  --  0.24* 0.50 0.52  CREATININE 1.32*  --   --  1.21 1.13  --   --   < > = values in this interval not displayed.  Estimated Creatinine Clearance: 74.1 mL/min (by C-G formula based on Cr of 1.13).  Assessment: 70 yo M on heparin for afib. Heparin level confirmed therapeutic at 0.52.  Goal of Therapy:  Heparin level 0.3-0.7 units/ml Monitor platelets by anticoagulation protocol: Yes   Plan:  Continue Heparin infusion at 2000 units/hr Daily HL/CBC  Hildred Laser, Pharm D 03/25/2016 5:45 PM

## 2016-03-25 NOTE — Procedures (Signed)
Extubation Procedure Note  Patient Details:   Name: Julian Ross DOB: 1946/01/16 MRN: [id number]   Airway Documentation:   Pt had audible cuff leak prior to extubation, able to follow commands.  Extubated to 4lpm [place] sat 100%, HR 103, RR 15.  Tolerated well.  Able to state name and speak.  Will continue to monitor.  Evaluation  O2 sats: stable throughout Complications: No apparent complications Patient did tolerate procedure well. Bilateral Breath Sounds: Clear, Diminished Suctioning: Oral, Airway Yes  Ned Grace 03/25/2016, 10:11 AM

## 2016-03-25 NOTE — Progress Notes (Signed)
ANTICOAGULATION CONSULT NOTE - Follow Up Consult  Pharmacy Consult for heparin Indication: atrial fibrillation   Labs:  Recent Labs  03/22/16 0835  03/23/16 0325 03/23/16 0425 03/24/16 0406 03/24/16 1435 03/25/16 0300  HGB  --   < > 9.6*  --  9.8*  --  9.4*  HCT  --   --  29.9*  --  29.9*  --  30.2*  PLT  --   --  269  --  269  --  334  HEPARINUNFRC  --   < >  --  0.42 0.37  --  0.24*  CREATININE 1.36*  --  1.32*  --   --  1.21  --   < > = values in this interval not displayed.    Assessment: 70yo male now subtherapeutic on heparin after several levels at goal though had been trending down and was at low end of goal yesterday.  Goal of Therapy:  Heparin level 0.3-0.7 units/ml   Plan:  Will increase heparin gtt by 1-2 units/kg/hr to 2000 units/hr and check level in [place], PharmD, BCPS  03/25/2016,3:56 AM

## 2016-03-25 NOTE — Progress Notes (Signed)
[facility] ADULT ICU REPLACEMENT PROTOCOL FOR AM LAB REPLACEMENT ONLY  The patient does apply for the [facility] Adult ICU Electrolyte Replacment Protocol based on the criteria listed below:   1. Is GFR >/= 40 ml/min? Yes.    Patient's GFR today is >60 2. Is urine output >/= 0.5 ml/kg/hr for the last 6 hours? Yes.   Patient's UOP is 0.8 ml/kg/hr 3. Is BUN < 60 mg/dL? Yes.    Patient's BUN today is 41 4. Abnormal electrolyte(s): K+2.9 5. Ordered repletion with: protocol 6. If a panic level lab has been reported, has the CCM MD in charge been notified? Yes.  .   Physician:  Arlester Marker, Eddie Dibbles [place] 03/25/2016 4:21 AM

## 2016-03-25 NOTE — Progress Notes (Signed)
ANTICOAGULATION CONSULT NOTE - Follow Up Consult  Pharmacy Consult for heparin  Indication: atrial fibrillation  Allergies  Allergen Reactions  . Ace Inhibitors Cough  . Atenolol Other (See Comments)    Unknown reaction  . Contrast Media [Iodinated Diagnostic Agents] Rash  . Penicillins Hives and Rash    Has patient had a PCN reaction causing immediate rash, facial/tongue/throat swelling, SOB or lightheadedness with hypotension: Yes Has patient had a PCN reaction causing severe rash involving mucus membranes or skin necrosis: No Has patient had a PCN reaction that required hospitalization pt was in the hospital at time of last reaction - heart attack Has patient had a PCN reaction occurring within the last 10 years: No If all of the above answers are "NO", then may proceed with Cephalosporin use.    Patient Measurements: Height: 5\' 11"  (180.3 cm) Weight: 218 lb 14.7 oz (99.3 kg) IBW/kg (Calculated) : 75.3  Vital Signs: Temp: 99.4 F (37.4 C) (03/26 0814) Temp Source: Oral (03/26 0814) BP: 145/80 mmHg (03/26 1100) Pulse Rate: 101 (03/26 1100)  Labs:  Recent Labs  03/23/16 0325  03/24/16 0406 03/24/16 1435 03/25/16 0300 03/25/16 1025  HGB 9.6*  --  9.8*  --  9.4*  --   HCT 29.9*  --  29.9*  --  30.2*  --   PLT 269  --  269  --  334  --   HEPARINUNFRC  --   < > 0.37  --  0.24* 0.50  CREATININE 1.32*  --   --  1.21 1.13  --   < > = values in this interval not displayed.  Estimated Creatinine Clearance: 74.1 mL/min (by C-G formula based on Cr of 1.13).  Assessment: 70 yo M on heparin for afib. Heparin level is now therapeutic at 0.5 after rate adjustment to 2000 units/hr. CBC stable. No issues with line or bleeding reported per RN.  Goal of Therapy:  Heparin level 0.3-0.7 units/ml Monitor platelets by anticoagulation protocol: Yes   Plan:  Continue Heparin infusion at 2000 units/hr Check heparin level in 6 hours to confirm Daily HL/CBC Monitor s/sx  bleeding  Legrand Como, Pharm.D., BCPS, AAHIVP Clinical Pharmacist Phone: [phone] or [phone] 03/25/2016, 12:05 PM

## 2016-03-26 DIAGNOSIS — I5043 Acute on chronic combined systolic (congestive) and diastolic (congestive) heart failure: Secondary | ICD-10-CM

## 2016-03-26 DIAGNOSIS — I4891 Unspecified atrial fibrillation: Secondary | ICD-10-CM

## 2016-03-26 DIAGNOSIS — J9601 Acute respiratory failure with hypoxia: Secondary | ICD-10-CM

## 2016-03-26 LAB — HEPARIN LEVEL (UNFRACTIONATED): HEPARIN UNFRACTIONATED: 0.53 [IU]/mL (ref 0.30–0.70)

## 2016-03-26 LAB — CBC
HCT: 32.4 % — ABNORMAL LOW (ref 39.0–52.0)
HEMOGLOBIN: 10.7 g/dL — AB (ref 13.0–17.0)
MCH: 30.8 pg (ref 26.0–34.0)
MCHC: 33 g/dL (ref 30.0–36.0)
MCV: 93.4 fL (ref 78.0–100.0)
Platelets: 288 10*3/uL (ref 150–400)
RBC: 3.47 MIL/uL — ABNORMAL LOW (ref 4.22–5.81)
RDW: 14.8 % (ref 11.5–15.5)
WBC: 15.2 10*3/uL — ABNORMAL HIGH (ref 4.0–10.5)

## 2016-03-26 LAB — GLUCOSE, CAPILLARY
GLUCOSE-CAPILLARY: 161 mg/dL — AB (ref 65–99)
GLUCOSE-CAPILLARY: 92 mg/dL (ref 65–99)
GLUCOSE-CAPILLARY: 158 mg/dL — AB (ref 65–99)
GLUCOSE-CAPILLARY: 183 mg/dL — AB (ref 65–99)
Glucose-Capillary: 165 mg/dL — ABNORMAL HIGH (ref 65–99)
Glucose-Capillary: 223 mg/dL — ABNORMAL HIGH (ref 65–99)

## 2016-03-26 LAB — BASIC METABOLIC PANEL
Anion gap: 13 (ref 5–15)
BUN: 32 mg/dL — AB (ref 6–20)
CO2: 25 mmol/L (ref 22–32)
Calcium: 9 mg/dL (ref 8.9–10.3)
Chloride: 109 mmol/L (ref 101–111)
Creatinine, Ser: 1.05 mg/dL (ref 0.61–1.24)
GFR calc Af Amer: >60 mL/min (ref >60)
GLUCOSE: 173 mg/dL — AB (ref 65–99)
POTASSIUM: 3.5 mmol/L (ref 3.5–5.1)
Sodium: 147 mmol/L — ABNORMAL HIGH (ref 135–145)

## 2016-03-26 MED ORDER — METOPROLOL TARTRATE 25 MG PO TABS
25.0000 mg | ORAL_TABLET | Freq: Two times a day (BID) | ORAL | Status: DC
  Administered 2016-03-26 – 2016-03-27 (×3): 25 mg via ORAL
  Filled 2016-03-26 (×4): qty 1

## 2016-03-26 MED ORDER — FUROSEMIDE 10 MG/ML IJ SOLN
40.0000 mg | Freq: Two times a day (BID) | INTRAMUSCULAR | Status: DC
  Administered 2016-03-26 – 2016-03-28 (×4): 40 mg via INTRAVENOUS
  Filled 2016-03-26 (×6): qty 4

## 2016-03-26 MED ORDER — ATROPINE SULFATE 0.1 MG/ML IJ SOLN
INTRAMUSCULAR | Status: AC
  Filled 2016-03-26: qty 10

## 2016-03-26 MED ORDER — DEXTROSE 5 % IV SOLN
INTRAVENOUS | Status: DC
  Administered 2016-03-26 – 2016-03-27 (×2): via INTRAVENOUS

## 2016-03-26 MED ORDER — DEXMEDETOMIDINE HCL IN NACL 200 MCG/50ML IV SOLN
0.4000 ug/kg/h | INTRAVENOUS | Status: DC
  Administered 2016-03-26: 0.4 ug/kg/h via INTRAVENOUS
  Filled 2016-03-26 (×2): qty 50

## 2016-03-26 MED ORDER — PREDNISONE 5 MG PO TABS
5.0000 mg | ORAL_TABLET | Freq: Every day | ORAL | Status: DC
  Administered 2016-03-27 – 2016-04-02 (×7): 5 mg via ORAL
  Filled 2016-03-26 (×7): qty 1

## 2016-03-26 MED ORDER — POTASSIUM CHLORIDE 20 MEQ/15ML (10%) PO SOLN
40.0000 meq | Freq: Once | ORAL | Status: AC
  Administered 2016-03-26: 40 meq via ORAL
  Filled 2016-03-26: qty 30

## 2016-03-26 MED ORDER — LOSARTAN POTASSIUM 50 MG PO TABS
75.0000 mg | ORAL_TABLET | Freq: Every day | ORAL | Status: DC
  Administered 2016-03-27: 75 mg via ORAL
  Filled 2016-03-26 (×2): qty 1

## 2016-03-26 MED ORDER — WHITE PETROLATUM GEL
Status: AC
  Filled 2016-03-26: qty 1

## 2016-03-26 MED ORDER — POTASSIUM CHLORIDE CRYS ER 20 MEQ PO TBCR
20.0000 meq | EXTENDED_RELEASE_TABLET | ORAL | Status: AC
  Administered 2016-03-26 (×2): 20 meq via ORAL
  Filled 2016-03-26 (×2): qty 1

## 2016-03-26 MED ORDER — METOPROLOL SUCCINATE ER 25 MG PO TB24: 25.0000 mg | ORAL_TABLET | Freq: Every day | ORAL | Status: DC

## 2016-03-26 NOTE — Progress Notes (Signed)
PULMONARY / CRITICAL CARE MEDICINE   Name: Julian Ross MRN: [id number] DOB: 08/18/1946    ADMISSION DATE:  03/18/2016 CONSULTATION DATE:  03/19/16  REFERRING MD:  Fuller Plan  CHIEF COMPLAINT:  Shortness of breath, confusion  SUBJECTIVE:   No ett Hr controlled Neg 3.3 liters  VITAL SIGNS: BP 110/70 mmHg  Pulse 94  Temp(Src) 97.3 F (36.3 C) (Oral)  Resp 18  Ht 5\' 11"  (1.803 m)  Wt 95.6 kg (210 lb 12.2 oz)  BMI 29.41 kg/m2  SpO2 97%  HEMODYNAMICS:    VENTILATOR SETTINGS:    INTAKE / OUTPUT: I/O last 3 completed shifts: In: 3913.5 [I.V.:1153.5; NG/GT:1240; IV Piggyback:1520] Out: 6780 [Urine:6780]  PHYSICAL EXAMINATION: General: awake, alert Neuro: wakes up, following commands CV: irreg irreg , no mgr PULM: improved crackles GI: soft, nontender, no masses Extremities: no edema, warm, acyanotic   LABS:  BMET  Recent Labs Lab 03/25/16 0300 03/25/16 1939 03/26/16 0248  NA 149* 149* 147*  K 2.9* 3.7 3.5  CL 112* 109 109  CO2 27 27 25   BUN 41* 36* 32*  CREATININE 1.13 1.03 1.05  GLUCOSE 261* 183* 173*    Electrolytes  Recent Labs Lab 03/22/16 1900  03/23/16 1621 03/24/16 1435 03/24/16 2008 03/25/16 0300 03/25/16 0805 03/25/16 1939 03/26/16 0248  CALCIUM  --   < >  --  8.9  --  8.8*  --  9.3 9.0  MG 2.1  --  2.0  --   --   --  2.1  --   --   PHOS  --   < > 2.3* 2.0* 2.1*  --  1.5*  --   --   < > = values in this interval not displayed.  CBC  Recent Labs Lab 03/24/16 0406 03/25/16 0300 03/26/16 0248  WBC 12.2* 12.0* 15.2*  HGB 9.8* 9.4* 10.7*  HCT 29.9* 30.2* 32.4*  PLT 269 334 288    Coag's No results for input(s): APTT, INR in the last 168 hours.  Sepsis Markers  Recent Labs Lab 03/19/16 1531  LATICACIDVEN 1.4    ABG  Recent Labs Lab 03/20/16 1159 03/22/16 2041 03/23/16 0200  PHART 7.229* 7.364 7.344*  PCO2ART 50.0* 44.4 48.1*  PO2ART 57.0* 67.0* 342.0*    Liver Enzymes  Recent Labs Lab 03/20/16 0350   AST 52*  ALT 26  ALKPHOS 37*  BILITOT 0.8  ALBUMIN 2.3*    Cardiac Enzymes No results for input(s): TROPONINI, PROBNP in the last 168 hours.  Glucose  Recent Labs Lab 03/25/16 1624 03/25/16 1953 03/26/16 0040 03/26/16 0357 03/26/16 0816 03/26/16 1142  GLUCAP 174* 153* 165* 161* 92 158*    Imaging 3/25 CXR images reviewed> persistent diffuse hazy opacities which worsened since his pre-extubation chest X rays  STUDIES:  CT Head 3/19 >> no acute process, chronic atrophy & small vessl disease ECHO 3/20 >> LVEF 30-35%, global hypokinesis, PA estimate 23mm Hg EEG 3/21 > slowing but no seizure EEG 3/22> no seizure 3/24 CT head > chronic microvascular change, nothing acute  CULTURES: Blood cx 3/19 >> RVP 3/19 >> neg Flu 3/19 >> neg   ANTIBIOTICS: Vanc 3/19 >> 3/22 Aztreonam 3/19 >> 10 days Levaquin 3/19 >> 3/22  SIGNIFICANT EVENTS: 3/19  Admit with suspected PNA, hypoxic respiratory failure 3/26 extubated  LINES/TUBES: ETT 3/20 > 3/23>>>3/26 Foley 3/20  3/24 CXR > worsenign bilateral airspace disease  DISCUSSION: Julian Ross is a 55 M with acute hypoxemic respiratory failure requiring mechanical ventilation. He has a  significant cardiac history with prior MI and prior stenting, RA on methotrexate/pred. He had HCAP and AKI on admission, both improving but has persistent tremors felt to be due to myoclonus.  Has new Afib and congestive heart failure, failed extubation 3/23 due to CHF in setting of Afib with RVR.   ASSESSMENT / PLAN:  PULMONARY A: Acute hypoxemic respiratory failure - improved HCAP - immunocompromised with RA OSA - on CPAP, ? compliance P:   IS, mobilize Maintain neg balance nocturnal cpap if used at home, as tolerated pcxr follow up for LLL  CARDIOVASCULAR A:  New onset A fib - improved control Chronic systolic CHF (LVEF 99991111) > improved 3/26 Mild troponin leak, demand ischemia Hx MI, CAD s/p PCI - reportedly has 16 stents per  wife Hx ICM  Bradycardia with dilt and precedex P:  restart dilt drip- off No precedex Heparin infusion, no oral as of now with fall risk Continue BIDIL Continue plavix  Continue cozaar  Continue metoprolol per cardiology, hope can give off precedex  RENAL A:   Acute on chronic kidney failure > better CKD III Hypokalemia hypernatremia P:   d5w at 30  Lasix reduction slight Chem in a kvo  GASTROINTESTINAL A:   GERD   P:   Stress ulcer ppx- dc if not home med  Slp done   HEMATOLOGIC A:   Anemia - suspect element of chronic disease hemoconcentration 3/26 P:  Monitor CBC Lovenox for DVT prophylaxis   INFECTIOUS A:   Severe sepsis secondary to pneumonia > improving P:   Off abx Follow fever curve  AUTOIMMUNE:  A:  RA -  on plaquenil / prednisone 5mg  QD P:  Hold home plaquenil Restart prednisone 10mg  home to 5 in am    ENDOCRINE A:   DMII P:   Accuchecks and SSI   NEUROLOGIC A:   Acute encephalopathy from home polypharmacy, ICU delirium > improved significantly Recurrent tremors> myoclonus per neurology, resolved Anxiety - uses xanax at home, family reports polypharmacy Diabetic Neuropathy  Essential Tremor  Partial Blindness  Hx CVA  P:   Continue home meds (gabapentin, paxil, topamax) Fentanyl prn Avoid benzo  To triad, sdu   FAMILY  - Updates:  Wife updated extensively at bedside 3/24.  Updated son 3/22 (physician)    - Inter-disciplinary family meet or Palliative Care meeting due by:  3/27  Lavon Paganini. Titus Mould, MD, [place] Pgr: [place] [facility]

## 2016-03-26 NOTE — Progress Notes (Signed)
[facility] ADULT ICU REPLACEMENT PROTOCOL FOR AM LAB REPLACEMENT ONLY  The patient does apply for the [facility] Adult ICU Electrolyte Replacment Protocol based on the criteria listed below:   1. Is GFR >/= 40 ml/min? Yes.    Patient's GFR today is >60 2. Is urine output >/= 0.5 ml/kg/hr for the last 6 hours? Yes.   Patient's UOP is 0.5 ml/kg/hr 3. Is BUN < 60 mg/dL? Yes.    Patient's BUN today is 32 4. Abnormal electrolyte(s): K=3.5 5. Ordered repletion with: protocol 6. If a panic level lab has been reported, has the CCM MD in charge been notified? No..   Physician:  Hulan Amato [facility] 03/26/2016 4:19 AM

## 2016-03-26 NOTE — Progress Notes (Signed)
ANTICOAGULATION CONSULT NOTE - Follow Up Consult  Pharmacy Consult for heparin  Indication: atrial fibrillation  Allergies  Allergen Reactions  . Ace Inhibitors Cough  . Atenolol Other (See Comments)    Unknown reaction  . Contrast Media [Iodinated Diagnostic Agents] Rash  . Penicillins Hives and Rash    Has patient had a PCN reaction causing immediate rash, facial/tongue/throat swelling, SOB or lightheadedness with hypotension: Yes Has patient had a PCN reaction causing severe rash involving mucus membranes or skin necrosis: No Has patient had a PCN reaction that required hospitalization pt was in the hospital at time of last reaction - heart attack Has patient had a PCN reaction occurring within the last 10 years: No If all of the above answers are "NO", then may proceed with Cephalosporin use.    Patient Measurements: Height: 5\' 11"  (180.3 cm) Weight: 210 lb 12.2 oz (95.6 kg) IBW/kg (Calculated) : 75.3  Vital Signs: Temp: 97.6 F (36.4 C) (03/27 0359) Temp Source: Oral (03/27 0359) BP: 147/130 mmHg (03/27 0600) Pulse Rate: 123 (03/27 0600)  Labs:  Recent Labs  03/24/16 0406  03/25/16 0300 03/25/16 1025 03/25/16 1601 03/25/16 1939 03/26/16 0248  HGB 9.8*  --  9.4*  --   --   --  10.7*  HCT 29.9*  --  30.2*  --   --   --  32.4*  PLT 269  --  334  --   --   --  288  HEPARINUNFRC 0.37  --  0.24* 0.50 0.52  --  0.53  CREATININE  --   < > 1.13  --   --  1.03 1.05  < > = values in this interval not displayed.  Estimated Creatinine Clearance: 78.3 mL/min (by C-G formula based on Cr of 1.05).  Assessment: 70 yo M continues on heparin for afib. Heparin level remains therapeutic at 0.53. CBC is stable and no bleeding noted.   Goal of Therapy:  Heparin level 0.3-0.7 units/ml Monitor platelets by anticoagulation protocol: Yes   Plan:  - Continue heparin gtt 2000 units/hr - Daily heparin level and CBC - F/u plans for oral anticoagulation  Salome Arnt, PharmD,  BCPS Pager # [phone] 03/26/2016 8:00 AM

## 2016-03-26 NOTE — Progress Notes (Signed)
PATIENT ID: [id number] with CAD s/p MI and PCI, chronic systolic heart failure, hypertension, hyperlipidemia, and diabetes mellitus who is here with hypoxic respiratory failure and pneumonia, new-onset atrial fibrillation.  INTERVAL HISTORY:  Extubated yesterday. Bradycardia overnight.   SUBJECTIVE:  Denies chest pain. Denies any history of atrial fibrillation.  PHYSICAL EXAM Filed Vitals:   03/26/16 0900 03/26/16 0930 03/26/16 1000 03/26/16 1100  BP: 113/95 114/68 130/77 110/70  Pulse: 62 71 66 94  Temp:    97.3 F (36.3 C)  TempSrc:    Oral  Resp: 22 20 16 18   Height:      Weight:      SpO2: 100% 100% 100% 97%    . antiseptic oral rinse  7 mL Mouth Rinse BID  . clopidogrel  75 mg Per Tube Daily  . famotidine  20 mg Per Tube Daily  . folic acid  1 mg Oral Daily  . furosemide  40 mg Intravenous Q12H  . gabapentin  300 mg Oral TID  . insulin aspart  0-20 Units Subcutaneous 6 times per day  . isosorbide-hydrALAZINE  1 tablet Oral BID  . levETIRAcetam  1,000 mg Intravenous Q12H  . losartan  50 mg Oral Daily  . metoprolol tartrate  25 mg Oral BID  . PARoxetine  40 mg Oral Daily  . [START ON 03/27/2016] predniSONE  5 mg Oral Q breakfast  . topiramate  25 mg Oral BID  . white petrolatum       . dexmedetomidine Stopped (03/26/16 1000)  . dextrose 30 mL/hr at 03/26/16 1415  . heparin 2,000 Units/hr (03/26/16 1319)   General:  Intubated and sedated. Neck: No JVD   Lungs:  Irregularly irregular. No murmurs, rubs, or gallops.  Heart:  Vent and breath sounds. Otherwise clear anteriorly. Abdomen:  Soft. Active bowel sounds. Extremities:  Warm and well-perfused. No edema. 2+ DP pulses bilaterally.  LABS: Lab Results  Component Value Date   TROPONINI 0.10* 03/19/2016   Results for orders placed or performed during the hospital encounter of 03/18/16 (from the past 24 hour(s))  Glucose, capillary     Status: Abnormal   Collection Time: 03/25/16 12:15 PM  Result Value Ref Range   Glucose-Capillary 255 (H) 65 - 99 mg/dL  Heparin level (unfractionated)     Status: None   Collection Time: 03/25/16  4:01 PM  Result Value Ref Range   Heparin Unfractionated 0.52 0.30 - 0.70 IU/mL  Glucose, capillary     Status: Abnormal   Collection Time: 03/25/16  4:24 PM  Result Value Ref Range   Glucose-Capillary 174 (H) 65 - 99 mg/dL  BMET today at 2000     Status: Abnormal   Collection Time: 03/25/16  7:39 PM  Result Value Ref Range   Sodium 149 (H) 135 - 145 mmol/L   Potassium 3.7 3.5 - 5.1 mmol/L   Chloride 109 101 - 111 mmol/L   CO2 27 22 - 32 mmol/L   Glucose, Bld 183 (H) 65 - 99 mg/dL   BUN 36 (H) 6 - 20 mg/dL   Creatinine, Ser 1.03 0.61 - 1.24 mg/dL   Calcium 9.3 8.9 - 10.3 mg/dL   GFR calc non Af Amer >60 >60 mL/min   GFR calc Af Amer >60 >60 mL/min   Anion gap 13 5 - 15  Glucose, capillary     Status: Abnormal   Collection Time: 03/25/16  7:53 PM  Result Value Ref Range   Glucose-Capillary 153 (H) 65 - 99  mg/dL   Comment 1 Notify RN   Glucose, capillary     Status: Abnormal   Collection Time: 03/26/16 12:40 AM  Result Value Ref Range   Glucose-Capillary 165 (H) 65 - 99 mg/dL   Comment 1 Notify RN   CBC     Status: Abnormal   Collection Time: 03/26/16  2:48 AM  Result Value Ref Range   WBC 15.2 (H) 4.0 - 10.5 K/uL   RBC 3.47 (L) 4.22 - 5.81 MIL/uL   Hemoglobin 10.7 (L) 13.0 - 17.0 g/dL   HCT 32.4 (L) 39.0 - 52.0 %   MCV 93.4 78.0 - 100.0 fL   MCH 30.8 26.0 - 34.0 pg   MCHC 33.0 30.0 - 36.0 g/dL   RDW 14.8 11.5 - 15.5 %   Platelets 288 150 - 400 K/uL  Heparin level (unfractionated)     Status: None   Collection Time: 03/26/16  2:48 AM  Result Value Ref Range   Heparin Unfractionated 0.53 0.30 - 0.70 IU/mL  Basic metabolic panel     Status: Abnormal   Collection Time: 03/26/16  2:48 AM  Result Value Ref Range   Sodium 147 (H) 135 - 145 mmol/L   Potassium 3.5 3.5 - 5.1 mmol/L   Chloride 109 101 - 111 mmol/L   CO2 25 22 - 32 mmol/L   Glucose, Bld 173  (H) 65 - 99 mg/dL   BUN 32 (H) 6 - 20 mg/dL   Creatinine, Ser 1.05 0.61 - 1.24 mg/dL   Calcium 9.0 8.9 - 10.3 mg/dL   GFR calc non Af Amer >60 >60 mL/min   GFR calc Af Amer >60 >60 mL/min   Anion gap 13 5 - 15  Glucose, capillary     Status: Abnormal   Collection Time: 03/26/16  3:57 AM  Result Value Ref Range   Glucose-Capillary 161 (H) 65 - 99 mg/dL   Comment 1 Notify RN   Glucose, capillary     Status: None   Collection Time: 03/26/16  8:16 AM  Result Value Ref Range   Glucose-Capillary 92 65 - 99 mg/dL    Intake/Output Summary (Last 24 hours) at 03/26/16 1150 Last data filed at 03/26/16 1100  Gross per 24 hour  Intake 1816.05 ml  Output   4755 ml  Net -2938.95 ml    Telemetry: Atrial fibrillationRates with CVR. HRs 70s. He had significant bradycardia over night  ASSESSMENT AND PLAN:  Principal Problem:   Septic shock ([place]) Active Problems:   CKD (chronic kidney disease), stage III   Edema leg   Diabetes mellitus type 2 in obese (HCC)   Elevated troponin   Anemia   Acute hypoxemic respiratory failure (HCC)   Altered mental status   Atrial fibrillation, new onset (HCC)   Acute on chronic systolic and diastolic heart failure, NYHA class 1 (HCC)   Acute respiratory failure with hypoxemia (HCC)   AKI (acute kidney injury) ([place])   Acute respiratory failure with hypoxia ([place])  #  New-onset atrial fibrillation: Mr. Moskos remains in atrial fibrillation with CVR HR 70s currently. He did have significant bradycardia overnight HR 20-50s and IV dilt and metoprolol held. I will stop IV dilt ( want to avoid CCB in the setting of LV dysfunction anyway) and continue him on BB. He is currently on metoprolol 25 mg BID. WIll watch for further bradycardia. If he does okay on this, we could change him to Toprol XL in the setting of systolic CHF. CHADSVASC score at  least 4. He will require long-term anticoagulation. However, we will wait to start oral anticoagulation until after he is  also present and it is clear that he will not require any procedures. He has normal renal fucntion and could be started on a DOAC. ( his wife takes eliquis)  # Chronic systolic and diastolic heart failure: Mr. Demos has known systolic dysfunction with an ejection fraction of 45% on echo in 2008. Echo this admission revealed an EF of 30-35% with focal wall motion abnormalities. He has maintained a negative fluid balance with lasix 60 mg IV daily. Net neg 8L. Renal function stable.    # CAD: Mr. Mcclammy has an extensive history of CAD with up to 16 prior stents. Followed in [place] by cardiologist.  Troponin was mildly elevated at 0.13.  EKG has not been concerning for active ischemia.  He has had several episodes of ventricular ectopy. This seems to have improved on metoprolol.  Continue home Plavix and heparin.  He is also not on a statin.  LDL 67.  However, given his CAD history he should still be on at least a low-dose statin.  I discussed with the patient who said he cannot tolerate a statin due to bone pain and has tried many. He does not wish to try any new ones currently. Stress test vs cath when stable per Dr. Tobi Bastos PA-C  03/26/2016 11:50 AM    Patient seen and examined. Agree with assessment and plan. AF rate now controlled in the 70 - 80's without recurrent bradycardia as noted above. With LV dysfunction will plan to titrate losartan to 75 mg and ultimately 100 mg daily as BP allows.    Troy Sine, MD, [facility] 03/26/2016 5:55 PM

## 2016-03-26 NOTE — Care Management Note (Signed)
Case Management Note  Patient Details  Name: Julian Ross MRN: [id number] Date of Birth: February 13, 1946  Subjective/Objective:     Pt admitted with septic shock               Action/Plan:  Pt extubated 03/25/16.  PT/OT evaluation ordered  03/21/16  Pt intubated - information gathered from wife.  Pt was only slightly mobile at home prior to admit, wife reports that pt sleeps on average 20 hours a day.  CM will request PT evaluate pt post extubation.   Expected Discharge Date:                  Expected Discharge Plan:  [place]  In-House Referral:     Discharge planning Services  CM Consult  Post Acute Care Choice:    Choice offered to:     DME Arranged:    DME Agency:     HH Arranged:    [place] Agency:     Status of Service:  In process, will continue to follow  Medicare Important Message Given:    Date Medicare IM Given:    Medicare IM give by:    Date Additional Medicare IM Given:    Additional Medicare Important Message give by:     If discussed at [place] of Stay Meetings, dates discussed:    Additional Comments:  Maryclare Labrador, RN 03/26/2016, 2:25 PM

## 2016-03-26 NOTE — Evaluation (Signed)
Clinical/Bedside Swallow Evaluation Patient Details  Name: Julian Ross MRN: [id number] Date of Birth: 08-24-1946  Today's Date: 03/26/2016 Time: SLP Start Time (ACUTE ONLY): 0827 SLP Stop Time (ACUTE ONLY): 0845 SLP Time Calculation (min) (ACUTE ONLY): 18 min  Past Medical History:  Past Medical History  Diagnosis Date  . MI (myocardial infarction) ([place])   . Diabetes mellitus without complication ([place])   . Hypertension    Past Surgical History:  Past Surgical History  Procedure Laterality Date  . Coronary angioplasty with stent placement      x 10  . Angioplasty     HPI:  [name] is a 75 M with acute hypoxemic respiratory failure requiring mechanical ventilation on 3/20-3/23, reintubated until 3/26. He has a significant cardiac history with prior MI and prior stenting. He had HCAP and AKI on admission. Has new Afib and congestive heart failure.    Assessment / Plan / Recommendation Clinical Impression  Pt demonstrates no sign of aspiration with 3 oz of consecutive intake of water from straw over mulitple trials. Also tolerated taking pills with water with RN under SLP observation. Pt did begin coughing with graham cracker which persisted periodically over several minutes, also with some bradycardia. Pt is confused and sometimes attempts to talk while chewing. suspect some premature spillage to pharynx with possible penetration. Recommend pt initiate a dys 1 (puree) diet and thin liquids until mentation improves. Will f/u for upgraded diet.     Aspiration Risk  Mild aspiration risk    Diet Recommendation Dysphagia 1 (Puree);Thin liquid   Liquid Administration via: Cup;Straw Medication Administration: Whole meds with liquid Supervision: Full supervision/cueing for compensatory strategies Compensations: Minimize environmental distractions;Slow rate;Small sips/bites Postural Changes: Seated upright at 90 degrees    Other  Recommendations Oral Care Recommendations: Oral care BID    Follow up Recommendations  24 hour supervision/assistance    Frequency and Duration min 2x/week  1 week       Prognosis Prognosis for Safe Diet Advancement: Good Barriers to Reach Goals: Cognitive deficits      Swallow Study   General HPI: [name] is a 32 M with acute hypoxemic respiratory failure requiring mechanical ventilation on 3/20-3/23, reintubated until 3/26. He has a significant cardiac history with prior MI and prior stenting. He had HCAP and AKI on admission. Has new Afib and congestive heart failure.  Type of Study: Bedside Swallow Evaluation Previous Swallow Assessment: none Diet Prior to this Study: NPO Temperature Spikes Noted: No History of Recent Intubation: Yes Length of Intubations (days): 7 days Date extubated: 03/25/16 Behavior/Cognition: Confused Oral Cavity Assessment: Within Functional Limits Oral Care Completed by SLP: No Vision: Impaired for self-feeding (keeping eye partially closed) Self-Feeding Abilities: Total assist (retrained) Patient Positioning: Upright in bed Baseline Vocal Quality: Hoarse Volitional Cough: Strong Volitional Swallow: Able to elicit    Oral/Motor/Sensory Function Overall Oral Motor/Sensory Function: Other (comment) (appears WNL, will not follow commands)   Ice Chips Ice chips: Within functional limits   Thin Liquid Thin Liquid: Within functional limits Presentation: Cup;Straw    Nectar Thick Nectar Thick Liquid: Not tested   Honey Thick Honey Thick Liquid: Not tested   Puree Puree: Within functional limits   Solid   GO   Solid: Impaired Pharyngeal Phase Impairments: Cough - Immediate;Change in Vital Signs;Other (comments) (bradycardic)       Herbie [place], MA CCC-SLP [phone]  Kyomi Hector, Katherene Ponto 03/26/2016,8:52 AM

## 2016-03-27 DIAGNOSIS — G9340 Encephalopathy, unspecified: Secondary | ICD-10-CM

## 2016-03-27 DIAGNOSIS — N179 Acute kidney failure, unspecified: Secondary | ICD-10-CM

## 2016-03-27 DIAGNOSIS — F411 Generalized anxiety disorder: Secondary | ICD-10-CM

## 2016-03-27 DIAGNOSIS — N183 Chronic kidney disease, stage 3 (moderate): Secondary | ICD-10-CM

## 2016-03-27 DIAGNOSIS — M0609 Rheumatoid arthritis without rheumatoid factor, multiple sites: Secondary | ICD-10-CM | POA: Diagnosis present

## 2016-03-27 DIAGNOSIS — R7989 Other specified abnormal findings of blood chemistry: Secondary | ICD-10-CM

## 2016-03-27 DIAGNOSIS — E876 Hypokalemia: Secondary | ICD-10-CM

## 2016-03-27 DIAGNOSIS — I5022 Chronic systolic (congestive) heart failure: Secondary | ICD-10-CM

## 2016-03-27 LAB — GLUCOSE, CAPILLARY
GLUCOSE-CAPILLARY: 152 mg/dL — AB (ref 65–99)
GLUCOSE-CAPILLARY: 218 mg/dL — AB (ref 65–99)
GLUCOSE-CAPILLARY: 200 mg/dL — AB (ref 65–99)
GLUCOSE-CAPILLARY: 282 mg/dL — AB (ref 65–99)
Glucose-Capillary: 130 mg/dL — ABNORMAL HIGH (ref 65–99)
Glucose-Capillary: 147 mg/dL — ABNORMAL HIGH (ref 65–99)

## 2016-03-27 LAB — BASIC METABOLIC PANEL
Anion gap: 14 (ref 5–15)
BUN: 29 mg/dL — AB (ref 6–20)
CALCIUM: 9.2 mg/dL (ref 8.9–10.3)
CHLORIDE: 108 mmol/L (ref 101–111)
CO2: 20 mmol/L — AB (ref 22–32)
CREATININE: 1.01 mg/dL (ref 0.61–1.24)
GFR calc Af Amer: >60 mL/min (ref >60)
GFR calc non Af Amer: >60 mL/min (ref >60)
GLUCOSE: 137 mg/dL — AB (ref 65–99)
Potassium: 3.6 mmol/L (ref 3.5–5.1)
Sodium: 142 mmol/L (ref 135–145)

## 2016-03-27 LAB — FOLATE: FOLATE: 18.7 ng/mL (ref >5.9)

## 2016-03-27 LAB — CBC
HEMATOCRIT: 35 % — AB (ref 39.0–52.0)
Hemoglobin: 11.9 g/dL — ABNORMAL LOW (ref 13.0–17.0)
MCH: 31.6 pg (ref 26.0–34.0)
MCHC: 34 g/dL (ref 30.0–36.0)
MCV: 93.1 fL (ref 78.0–100.0)
PLATELETS: 354 10*3/uL (ref 150–400)
RBC: 3.76 MIL/uL — ABNORMAL LOW (ref 4.22–5.81)
RDW: 14.5 % (ref 11.5–15.5)
WBC: 16.4 10*3/uL — ABNORMAL HIGH (ref 4.0–10.5)

## 2016-03-27 LAB — IRON AND TIBC
Iron: 39 ug/dL — ABNORMAL LOW (ref 45–182)
Saturation Ratios: 15 % — ABNORMAL LOW (ref 17.9–39.5)
TIBC: 256 ug/dL (ref 250–450)
UIBC: 217 ug/dL

## 2016-03-27 LAB — HEPARIN LEVEL (UNFRACTIONATED): Heparin Unfractionated: 0.52 IU/mL (ref 0.30–0.70)

## 2016-03-27 LAB — MAGNESIUM: Magnesium: 1.9 mg/dL (ref 1.7–2.4)

## 2016-03-27 LAB — FERRITIN: Ferritin: 421 ng/mL — ABNORMAL HIGH (ref 24–336)

## 2016-03-27 LAB — RETICULOCYTES
RBC.: 3.81 MIL/uL — AB (ref 4.22–5.81)
RETIC COUNT ABSOLUTE: 80 10*3/uL (ref 19.0–186.0)
Retic Ct Pct: 2.1 % (ref 0.4–3.1)

## 2016-03-27 LAB — VITAMIN B12: VITAMIN B 12: 876 pg/mL (ref 180–914)

## 2016-03-27 LAB — PHOSPHORUS: PHOSPHORUS: 3.2 mg/dL (ref 2.5–4.6)

## 2016-03-27 MED ORDER — MAGNESIUM SULFATE IN D5W 10-5 MG/ML-% IV SOLN
1.0000 g | Freq: Once | INTRAVENOUS | Status: AC
  Administered 2016-03-27: 1 g via INTRAVENOUS
  Filled 2016-03-27: qty 100

## 2016-03-27 MED ORDER — ENSURE ENLIVE PO LIQD
237.0000 mL | ORAL | Status: DC
  Administered 2016-03-27 – 2016-04-02 (×6): 237 mL via ORAL

## 2016-03-27 MED ORDER — METOPROLOL TARTRATE 25 MG PO TABS
37.5000 mg | ORAL_TABLET | Freq: Two times a day (BID) | ORAL | Status: DC
  Administered 2016-03-27 – 2016-03-30 (×6): 37.5 mg via ORAL
  Filled 2016-03-27 (×6): qty 1

## 2016-03-27 MED ORDER — LOSARTAN POTASSIUM 50 MG PO TABS
50.0000 mg | ORAL_TABLET | Freq: Two times a day (BID) | ORAL | Status: DC
  Administered 2016-03-28 – 2016-04-02 (×11): 50 mg via ORAL
  Filled 2016-03-27 (×11): qty 1

## 2016-03-27 MED ORDER — INSULIN GLARGINE 100 UNIT/ML ~~LOC~~ SOLN
8.0000 [IU] | Freq: Every day | SUBCUTANEOUS | Status: DC
  Administered 2016-03-27 – 2016-03-29 (×3): 8 [IU] via SUBCUTANEOUS
  Filled 2016-03-27 (×3): qty 0.08

## 2016-03-27 MED ORDER — POTASSIUM CHLORIDE CRYS ER 20 MEQ PO TBCR
20.0000 meq | EXTENDED_RELEASE_TABLET | Freq: Once | ORAL | Status: AC
  Administered 2016-03-27: 20 meq via ORAL
  Filled 2016-03-27: qty 1

## 2016-03-27 NOTE — Progress Notes (Signed)
Pt transferred from 68M today via bed .  Oriented to room.  Call bell at reach.  Instructed to call for assistance.  Pt noted to be confused to place and date.  Will continue to monitor.  Karie Kirks, Therapist, sports.

## 2016-03-27 NOTE — Evaluation (Signed)
Occupational Therapy Evaluation Patient Details Name: Julian Ross MRN: [id number] DOB: 01/20/1946 Today's Date: 03/27/2016    History of Present Illness 70 yo M with CAD s/p MI and PCI, chronic systolic heart failure, hypertension, hyperlipidemia, and diabetes mellitus who is here with hypoxic respiratory failure and pneumonia, new-onset atrial fibrillation.   Clinical Impression   Patient presenting with decreased ADL and functional mobility independence secondary to above.  Patient required assistance according to chart PTA. Patient currently functioning at an overall max to total assist level, requiring +2 during most mobility and tasks. Patient will benefit from acute OT to increase overall independence in the areas of ADLs, functional mobility, and overall safety in order to safely discharge to venue listed below.     Follow Up Recommendations  SNF;Supervision/Assistance - 24 hour    Equipment Recommendations  Other (comment) (TBD next venue of care)    Recommendations for Other Services  None at this time   Precautions / Restrictions Precautions Precautions: Fall Restrictions Weight Bearing Restrictions: No    Mobility Bed Mobility Overal bed mobility: Needs Assistance Bed Mobility: Rolling;Sidelying to Sit;Sit to Sidelying Rolling: Mod assist Sidelying to sit: Mod assist;+2 for physical assistance     Sit to sidelying: Mod assist;+2 for physical assistance General bed mobility comments: Constant cueing for safety, technique, sequencing.   Transfers Overall transfer level: Needs assistance Equipment used: Rolling walker (2 wheeled) Transfers: Sit to/from Stand Sit to Stand: Max assist;+2 physical assistance General transfer comment: Pt with difficult coming to full upright standing position, trunk staying in more of a flexed posture when trying to stand. Multimodal cueing for safety, technique, and sequencing.     Balance Overall balance assessment: Needs  assistance Sitting-balance support: No upper extremity supported;Feet supported Sitting balance-Leahy Scale: Poor Sitting balance - Comments: poor to zero, pt with right lateral lean and occassional posterior lean    Standing balance support: During functional activity;Bilateral upper extremity supported Standing balance-Leahy Scale: Poor Standing balance comment: Poor to zero; therapist providing BUE support    ADL Overall ADL's : Needs assistance/impaired Eating/Feeding: Total assistance;Bed level   Grooming: Total assistance;Bed level   Upper Body Bathing: Total assistance;Bed level   Lower Body Bathing: Total assistance;Bed level   Upper Body Dressing : Total assistance;Bed level   Lower Body Dressing: Total assistance;Bed level     Toilet Transfer Details (indicate cue type and reason): did not occur, safety concerns Toileting- Clothing Manipulation and Hygiene: Total assistance;Bed level     Tub/Shower Transfer Details (indicate cue type and reason): did not occur, safety concerns    General ADL Comments: Pt requires overall total assist for ADLs, +2 helpful for safety.     Vision Vision Assessment?: Vision impaired- to be further tested in functional context Additional Comments: Pt unable to keep eyes open during session, pt unable to follow simple command to open eyes consistently. Vision seems to be impaired - to be further assessed.           Pertinent Vitals/Pain Pain Assessment: Faces Faces Pain Scale: No hurt     Hand Dominance  (unsure)   Extremity/Trunk Assessment Upper Extremity Assessment Upper Extremity Assessment: Generalized weakness;Difficult to assess due to impaired cognition (Difficult to fully asses secondary to cognition, pt poor historian and inconsistently followed simple one step commands)   Lower Extremity Assessment Lower Extremity Assessment: Defer to PT evaluation   Cervical / Trunk Assessment Cervical / Trunk Assessment: Other  exceptions Cervical / Trunk Exceptions: cervical region right lateral lean  that was difficult to correct against gravity (due to cognition or anatomy?). Easily correctable gravity eliminated.    Communication Communication Communication: No difficulties   Cognition Arousal/Alertness: Lethargic Behavior During Therapy: Flat affect Overall Cognitive Status: No family/caregiver present to determine baseline cognitive functioning (Pt oriented to self only. Pt unable to follow simple one step commands consistently. )               Home Living Family/patient expects to be discharged to:: Skilled nursing facility   Additional Comments: According to chart pt required assistance for ADLs and functional mobility PTA. Pt poor historian and unable to answer questions correctly.       Prior Functioning/Environment Level of Independence: Needs assistance     OT Diagnosis: Generalized weakness;Cognitive deficits   OT Problem List: Decreased strength;Decreased range of motion;Decreased activity tolerance;Impaired balance (sitting and/or standing);Impaired vision/perception;Decreased coordination;Decreased cognition;Decreased safety awareness;Decreased knowledge of use of DME or AE;Decreased knowledge of precautions;Impaired UE functional use   OT Treatment/Interventions: Self-care/ADL training;Therapeutic exercise;Energy conservation;DME and/or AE instruction;Therapeutic activities;Patient/family education;Balance training    OT Goals(Current goals can be found in the care plan section) Acute Rehab OT Goals Patient Stated Goal: none stated, pt unable  OT Goal Formulation: Patient unable to participate in goal setting Time For Goal Achievement: 04/10/16 Potential to Achieve Goals: Fair ADL Goals Pt Will Perform Grooming: with set-up;sitting (EOB, unsupported) Pt Will Transfer to Toilet: stand pivot transfer;bedside commode;with mod assist (1 person) Additional ADL Goal #1: Pt will engage in bed  mobility and be able to perform sit to/from supine with supervision as a precursor for ADLs and to decrease burden of care Additional ADL Goal #2: Pt will be able to perform sit to/from stands with mod assist of 1 person as a precursor for ADLs and to decrease burden of care   OT Frequency: Min 2X/week   Barriers to D/C: unsure at this time       Co-evaluation PT/OT/SLP Co-Evaluation/Treatment: Yes Reason for Co-Treatment: For patient/therapist safety;Complexity of the patient's impairments (multi-system involvement)   OT goals addressed during session: ADL's and self-care;Strengthening/ROM      End of Session Equipment Utilized During Treatment: Gait belt;Rolling walker Nurse Communication: Mobility status;Other (comment) (no sign on door signifying patient was on isolation precautions)  Activity Tolerance: Patient tolerated treatment well Patient left: in bed;with call bell/phone within reach;with chair alarm set   Time: [id number] OT Time Calculation (min): 19 min Charges:  OT General Charges $OT Visit: 1 Procedure OT Evaluation $OT Eval Moderate Complexity: 1 Procedure  Chrys Racer , MS, OTR/L, CLT Pager: [phone]  03/27/2016, 4:16 PM

## 2016-03-27 NOTE — Progress Notes (Signed)
Speech Language Pathology Treatment:    Patient Details Name: Puneet Casalino MRN: [id number] DOB: 10/06/1946 Today's Date: 03/27/2016 Time: 1537-1548 SLP Time Calculation (min) (ACUTE ONLY): 11 min  Assessment / Plan / Recommendation Clinical Impression  Pt demonstrates improved attention to Po trials with no signs of aspiration over 8 oz of intake. Pt able to masticate solids without coughing or attempting to speak while chewing. He does appear to have some xerostomia which will likely improve as Po intake improves. Recommend pt upgrade to Regular texture solids and thin liquids. No SLP f/u needed will sign off.    HPI HPI: Mr. Goynes is a 69 M with acute hypoxemic respiratory failure requiring mechanical ventilation on 3/20-3/23, reintubated until 3/26. He has a significant cardiac history with prior MI and prior stenting. He had HCAP and AKI on admission. Has new Afib and congestive heart failure.       SLP Plan  Discharge SLP treatment due to (comment);All goals met     Recommendations  Diet recommendations: Regular;Thin liquid Liquids provided via: Cup;Straw Medication Administration: Whole meds with liquid Supervision: Staff to assist with self feeding Compensations: Minimize environmental distractions;Slow rate;Small sips/bites Postural Changes and/or Swallow Maneuvers: Seated upright 90 degrees             Follow up Recommendations: None Plan: Discharge SLP treatment due to (comment);All goals met     GO                , MA CCC-SLP [phone]  ,  [place] 03/27/2016, 3:59 PM    

## 2016-03-27 NOTE — Progress Notes (Signed)
Nutrition Follow-up  DOCUMENTATION CODES:   Obesity unspecified  INTERVENTION:  -Provide Ensure Enlive daily, 350 kcal, 20 grams of protein per bottle.   NUTRITION DIAGNOSIS:   Swallowing difficulty related to dysphagia as evidenced by  (Per SLP evaluation).  GOAL:   Patient will meet greater than or equal to 90% of their needs  MONITOR:   PO intake, Supplement acceptance, Labs, Weight trends  ASSESSMENT:   Pt w/ history of HTN, DMII, CAD s/p stent, prior MI, HLD who presents with worsening shortness of breath over the last few days.   3/20-Intubated 3/20-OG tube placed 3/23-Extubated, TF turned off 3/24-Intubated, respiratory insufficiency  3/26-Extubated  Pt is now on a dysphagia I thin liquid diet, per SLP recommendation. Spoke with pt's wife she reports he is eating and drinking well. Observed tray at bedside pt consume ~75% of breakfast. Will provide Ensure Enlive daily to help meet nutritional needs.   Medications reviewed. Labs reviewed; CBG [phone]  Diet Order:  DIET - DYS 1 Room service appropriate?: Yes; Fluid consistency:: Thin  Skin:  Reviewed, no issues  Last BM:  03/24/2016  Height:   Ht Readings from Last 1 Encounters:  03/19/16 5\' 11"  (1.803 m)    Weight:   Wt Readings from Last 1 Encounters:  03/27/16 203 lb 0.7 oz (92.1 kg)    Ideal Body Weight:  78.1 kg  BMI:  Body mass index is 28.33 kg/(m^2).  Estimated Nutritional Needs:   Kcal:  1800-2000  Protein:  100-115 grams (1.3 g/IBW)  Fluid:  1.8-2 L  EDUCATION NEEDS:   No education needs identified at this time  [place], Dietetic Intern Pager: [phone]

## 2016-03-27 NOTE — Evaluation (Signed)
Physical Therapy Evaluation Patient Details Name: Julian Ross MRN: [id number] DOB: [birth year] Today's Date: 03/27/2016   History of Present Illness  70 yo M with CAD s/p MI and PCI, chronic systolic heart failure, hypertension, hyperlipidemia, and diabetes mellitus who is here with hypoxic respiratory failure and pneumonia, new-onset atrial fibrillation.  Clinical Impression  Patient presents with confusion, generalized weakness, balance deficits and decreased endurance impacting mobility. Tolerated standing with assist of 2 and taking a few steps along side bed with Max A of 2. Difficult keeping eyes open during session and pt with nonsensical speech. Right lateral lean sitting EOB. Not able to provide information about PLOF/history due to confusion. Pt will most likely need ST SNF to maximize independence and mobility prior to return home with support from wife.     Follow Up Recommendations SNF    Equipment Recommendations  Other (comment) (TBD)    Recommendations for Other Services OT consult     Precautions / Restrictions Precautions Precautions: Fall Restrictions Weight Bearing Restrictions: No      Mobility  Bed Mobility Overal bed mobility: Needs Assistance Bed Mobility: Rolling;Sidelying to Sit;Sit to Sidelying Rolling: Mod assist Sidelying to sit: Mod assist;+2 for physical assistance     Sit to sidelying: Mod assist;+2 for physical assistance General bed mobility comments: Constant cueing for safety, technique, sequencing.   Transfers Overall transfer level: Needs assistance Equipment used: Rolling walker (2 wheeled) Transfers: Sit to/from Stand Sit to Stand: Max assist;+2 physical assistance         General transfer comment: Pt with difficult coming to full upright standing position, trunk staying in more of a flexed posture when trying to stand. Multimodal cueing for safety, technique, and sequencing. Pt with right lateral trunk lean and inreased knee  flexion bilaterally.  Ambulation/Gait Ambulation/Gait assistance: Mod assist;+2 physical assistance;Max assist Ambulation Distance (Feet): 4 Feet Assistive device: Rolling walker (2 wheeled) Gait Pattern/deviations: Step-to pattern     General Gait Details: Able to side step along side bed with increased knee flexion with assist navigating RW, assist with weight shifting and with balance. Performed x2  Stairs            Wheelchair Mobility    Modified Rankin (Stroke Patients Only)       Balance Overall balance assessment: Needs assistance Sitting-balance support: Feet supported;No upper extremity supported Sitting balance-Leahy Scale: Poor Sitting balance - Comments: poor to zero, pt with right lateral lean and occassional posterior lean    Standing balance support: During functional activity Standing balance-Leahy Scale: Poor Standing balance comment: Requires BUE support                             Pertinent Vitals/Pain Pain Assessment: Faces Faces Pain Scale: No hurt    Home Living Family/patient expects to be discharged to:: Skilled nursing facility Living Arrangements: Spouse/significant other               Additional Comments: According to chart pt required assistance for ADLs and functional mobility PTA. Pt poor historian and unable to answer questions correctly.     Prior Function Level of Independence: Needs assistance   Gait / Transfers Assistance Needed: Reports using RW at home as needed.      Comments: Per chart, pt sleeps most of day and is minimally mobile PTA.     Hand Dominance   Dominant Hand:  (unsure)    Extremity/Trunk Assessment   Upper Extremity Assessment: Defer  to OT evaluation           Lower Extremity Assessment: Generalized weakness (Difficult to assess secondary to cognitive deficits and inability to follow simple 1 step commands consistently. Bil knee instability noted in standing. )      Cervical  / Trunk Assessment: Other exceptions  Communication   Communication: No difficulties  Cognition Arousal/Alertness: Lethargic Behavior During Therapy: Flat affect Overall Cognitive Status: No family/caregiver present to determine baseline cognitive functioning Area of Impairment: Problem solving;Orientation;Following commands Orientation Level: Disoriented to;Place;Time;Situation     Following Commands: Follows one step commands inconsistently     Problem Solving: Slow processing;Decreased initiation;Difficulty sequencing;Requires verbal cues;Requires tactile cues General Comments[place] when asked location. Not able to follow simple 1 step commands consistently    General Comments General comments (skin integrity, edema, etc.): Very confused throughout session. Nonsensical speech at times.    Exercises        Assessment/Plan    PT Assessment Patient needs continued PT services  PT Diagnosis Difficulty walking;Generalized weakness;Altered mental status   PT Problem List Decreased strength;Pain;Decreased activity tolerance;Decreased cognition;Decreased mobility;Decreased safety awareness;Decreased balance;Decreased range of motion;Cardiopulmonary status limiting activity;Decreased coordination  PT Treatment Interventions Therapeutic exercise;Therapeutic activities;Functional mobility training;Patient/family education;Wheelchair mobility training;Gait training;Balance training   PT Goals (Current goals can be found in the Care Plan section) Acute Rehab PT Goals Patient Stated Goal: none stated, pt unable  PT Goal Formulation: Patient unable to participate in goal setting Time For Goal Achievement: 04/10/16 Potential to Achieve Goals: Fair    Frequency Min 2X/week   Barriers to discharge   not sure level of support    Co-evaluation PT/OT/SLP Co-Evaluation/Treatment: Yes Reason for Co-Treatment: For patient/therapist safety;Complexity of the patient's  impairments (multi-system involvement) PT goals addressed during session: Mobility/safety with mobility;Strengthening/ROM OT goals addressed during session: ADL's and self-care;Strengthening/ROM       End of Session Equipment Utilized During Treatment: Gait belt;Oxygen Activity Tolerance: Patient limited by lethargy Patient left: in bed;with call bell/phone within reach;with bed alarm set Nurse Communication: Mobility status;Need for lift equipment         Time: [id number] PT Time Calculation (min) (ACUTE ONLY): 26 min   Charges:   PT Evaluation $PT Eval Moderate Complexity: 1 Procedure     PT G Codes:        Julian Ross A Julian Ross 03/27/2016, 4:34 PM Wray Kearns, [place], DPT [phone]

## 2016-03-27 NOTE — Progress Notes (Signed)
ANTICOAGULATION CONSULT NOTE - Follow Up Consult  Pharmacy Consult for heparin  Indication: atrial fibrillation  Allergies  Allergen Reactions  . Ace Inhibitors Cough  . Atenolol Other (See Comments)    Unknown reaction  . Contrast Media [Iodinated Diagnostic Agents] Rash  . Penicillins Hives and Rash    Has patient had a PCN reaction causing immediate rash, facial/tongue/throat swelling, SOB or lightheadedness with hypotension: Yes Has patient had a PCN reaction causing severe rash involving mucus membranes or skin necrosis: No Has patient had a PCN reaction that required hospitalization pt was in the hospital at time of last reaction - heart attack Has patient had a PCN reaction occurring within the last 10 years: No If all of the above answers are "NO", then may proceed with Cephalosporin use.    Patient Measurements: Height: 5\' 11"  (180.3 cm) Weight: 203 lb 0.7 oz (92.1 kg) IBW/kg (Calculated) : 75.3  Vital Signs: Temp: 98 F (36.7 C) (03/28 0400) Temp Source: Oral (03/28 0400) BP: 142/80 mmHg (03/28 0600) Pulse Rate: 95 (03/28 0600)  Labs:  Recent Labs  03/25/16 0300  03/25/16 1601 03/25/16 1939 03/26/16 0248 03/27/16 0218  HGB 9.4*  --   --   --  10.7* 11.9*  HCT 30.2*  --   --   --  32.4* 35.0*  PLT 334  --   --   --  288 354  HEPARINUNFRC 0.24*  < > 0.52  --  0.53 0.52  CREATININE 1.13  --   --  1.03 1.05 1.01  < > = values in this interval not displayed.  Estimated Creatinine Clearance: 80.1 mL/min (by C-G formula based on Cr of 1.01).  Assessment: 70 yo M continues on heparin for afib. Heparin level remains therapeutic at 0.52. CBC is stable and no bleeding noted.   Goal of Therapy:  Heparin level 0.3-0.7 units/ml Monitor platelets by anticoagulation protocol: Yes   Plan:  - Continue heparin gtt 2000 units/hr - Daily heparin level and CBC - F/u plans for oral anticoagulation  Salome Arnt, PharmD, BCPS Pager # [phone] 03/27/2016 7:31  AM

## 2016-03-27 NOTE — Progress Notes (Signed)
Patient Name: Julian Ross Date of Encounter: 03/27/2016  Pt. Profile: 19M with CAD s/p MI and PCI, chronic systolic heart failure, hypertension, hyperlipidemia, and diabetes mellitus who is here with hypoxic respiratory failure and pneumonia, new-onset atrial fibrillation.  SUBJECTIVE  Denies chest pain or sob.   CURRENT MEDS . antiseptic oral rinse  7 mL Mouth Rinse BID  . clopidogrel  75 mg Per Tube Daily  . famotidine  20 mg Per Tube Daily  . feeding supplement (ENSURE ENLIVE)  237 mL Oral Q24H  . folic acid  1 mg Oral Daily  . furosemide  40 mg Intravenous Q12H  . gabapentin  300 mg Oral TID  . insulin aspart  0-20 Units Subcutaneous 6 times per day  . isosorbide-hydrALAZINE  1 tablet Oral BID  . levETIRAcetam  1,000 mg Intravenous Q12H  . losartan  75 mg Oral Daily  . metoprolol tartrate  25 mg Oral BID  . PARoxetine  40 mg Oral Daily  . predniSONE  5 mg Oral Q breakfast  . topiramate  25 mg Oral BID    OBJECTIVE  Filed Vitals:   03/27/16 1200 03/27/16 1300 03/27/16 1400 03/27/16 1450  BP: 146/78 144/80 143/79 144/88  Pulse: 93 88 99 87  Temp:    98.8 F (37.1 C)  TempSrc:    Oral  Resp: 26 23 28 18   Height:    5\' 11"  (1.803 m)  Weight:    202 lb 3.2 oz (91.717 kg)  SpO2: 95% 97% 96% 98%    Intake/Output Summary (Last 24 hours) at 03/27/16 1611 Last data filed at 03/27/16 1500  Gross per 24 hour  Intake   1600 ml  Output   4020 ml  Net  -2420 ml   Filed Weights   03/26/16 0700 03/27/16 0500 03/27/16 1450  Weight: 210 lb 12.2 oz (95.6 kg) 203 lb 0.7 oz (92.1 kg) 202 lb 3.2 oz (91.717 kg)    PHYSICAL EXAM  General: Pleasant, NAD. Neuro: Alert.  Moves all extremities spontaneously. Psych: Normal affect. HEENT:  Normal  Neck: Supple without bruits or JVD. Lungs:  Resp regular and unlabored, CTA. Heart: IR IR  no s3, s4, or murmurs. Abdomen: Soft, non-tender, non-distended, BS + x 4.  Extremities: No clubbing, cyanosis or edema. DP/PT/Radials 2+ and  equal bilaterally.  Accessory Clinical Findings  CBC  Recent Labs  03/25/16 0300 03/26/16 0248 03/27/16 0218  WBC 12.0* 15.2* 16.4*  NEUTROABS 9.4*  --   --   HGB 9.4* 10.7* 11.9*  HCT 30.2* 32.4* 35.0*  MCV 93.8 93.4 93.1  PLT 334 288 [id number]   Basic Metabolic Panel  Recent Labs  03/25/16 0805  03/26/16 0248 03/27/16 0218  NA  --   < > 147* 142  K  --   < > 3.5 3.6  CL  --   < > 109 108  CO2  --   < > 25 20*  GLUCOSE  --   < > 173* 137*  BUN  --   < > 32* 29*  CREATININE  --   < > 1.05 1.01  CALCIUM  --   < > 9.0 9.2  MG 2.1  --   --  1.9  PHOS 1.5*  --   --  3.2  < > = values in this interval not displayed. Liver Function Tests No results for input(s): AST, ALT, ALKPHOS, BILITOT, PROT, ALBUMIN in the last 72 hours. No results for input(s): LIPASE, AMYLASE in  the last 72 hours. Cardiac Enzymes No results for input(s): CKTOTAL, CKMB, CKMBINDEX, TROPONINI in the last 72 hours. BNP Invalid input(s): POCBNP D-Dimer No results for input(s): DDIMER in the last 72 hours. Hemoglobin A1C No results for input(s): HGBA1C in the last 72 hours. Fasting Lipid Panel No results for input(s): CHOL, HDL, LDLCALC, TRIG, CHOLHDL, LDLDIRECT in the last 72 hours. Thyroid Function Tests No results for input(s): TSH, T4TOTAL, T3FREE, THYROIDAB in the last 72 hours.  Invalid input(s): FREET3  TELE  afib at rate of 100s, transiently goes to 120s  Radiology/Studies  Ct Head Wo Contrast  03/23/2016  CLINICAL DATA:  Increasing lethargy after extubation. EXAM: CT HEAD WITHOUT CONTRAST TECHNIQUE: Contiguous axial images were obtained from the base of the skull through the vertex without intravenous contrast. COMPARISON:  03/18/2016. FINDINGS: No evidence for acute infarction, hemorrhage, mass lesion, hydrocephalus, or extra-axial fluid. Global atrophy. Chronic microvascular ischemic change. No osseous lesion. Vascular calcification. Stable changes of probable chronic sinus disease.  Unchanged appearance from priors. IMPRESSION: Stable chronic changes as described. No findings suggestive of acute cerebral infarction or hemorrhage. Electronically Signed   By: Staci Righter M.D.   On: 03/23/2016 14:54   Ct Head Wo Contrast  03/18/2016  CLINICAL DATA:  STROKE -LIKE SYMPTOMS-PLEASE EVALUATE FOR STROKE. Time course is not indicated. EXAM: CT HEAD WITHOUT CONTRAST TECHNIQUE: Contiguous axial images were obtained from the base of the skull through the vertex without intravenous contrast. COMPARISON:  None. FINDINGS: Diffuse cerebral atrophy. Ventricular dilatation consistent with central atrophy. Patchy areas of low-attenuation change in the deep white matter consistent with small vessel ischemia. No mass effect or midline shift. No abnormal extra-axial fluid collections. Gray-white matter junctions are distinct. Basal cisterns are not effaced. No evidence of acute intracranial hemorrhage. No depressed skull fractures. Mucosal thickening throughout the left maxillary antrum, left ethmoid air cells, left frontal, and left sphenoid sinuses. Minimal mucosal thickening in the right paranasal sinuses. Visualized mastoid air cells are not opacified. Vascular calcifications. IMPRESSION: No acute intracranial abnormalities. Chronic atrophy and small vessel ischemic changes. Electronically Signed   By: Lucienne Capers M.D.   On: 03/18/2016 18:19   Dg Chest Port 1 View  03/25/2016  CLINICAL DATA:  Acute respiratory failure, hypoxemia EXAM: PORTABLE CHEST 1 VIEW COMPARISON:  03/24/2016 FINDINGS: Cardiomediastinal silhouette is stable. Endotracheal tube and NG tube are unchanged in position. Persistent bilateral patchy airspace opacities left greater than right without significant change in aeration from prior exam. IMPRESSION: Stable support apparatus. No convincing pulmonary edema. Persistent bilateral patchy airspace opacities left greater than right. Electronically Signed   By: Lahoma Crocker M.D.   On:  03/25/2016 08:58   Dg Chest Port 1 View  03/24/2016  CLINICAL DATA:  Acute respiratory failure with hypoxemia. EXAM: PORTABLE CHEST 1 VIEW COMPARISON:  03/23/2016. FINDINGS: Endotracheal tube 3.5 cm above carina. Orogastric tube in the stomach. Slightly improved lung volumes with persistent BILATERAL patchy opacities representing possible pneumonia, edema, or ARDS. IMPRESSION: No active disease. Electronically Signed   By: Staci Righter M.D.   On: 03/24/2016 09:33   Dg Chest Port 1 View  03/23/2016  CLINICAL DATA:  Acute respiratory failure EXAM: PORTABLE CHEST 1 VIEW COMPARISON:  03/23/2016 FINDINGS: Endotracheal tube and nasogastric catheter are noted in satisfactory position. The cardiac shadow is stable. Lungs are again well aerated with bilateral airspace opacities stable from the previous exam. No new focal abnormality is noted. IMPRESSION: No change from the prior exam with the exception of nasogastric  catheter placement Electronically Signed   By: Inez Catalina M.D.   On: 03/23/2016 07:30   Dg Chest Port 1 View  03/23/2016  CLINICAL DATA:  Endotracheal tube placement EXAM: PORTABLE CHEST 1 VIEW COMPARISON:  03/21/2016 FINDINGS: Endotracheal tube with tip measuring 4.7 cm above the carina. Shallow inspiration. Borderline heart size. Diffuse patchy airspace infiltrates throughout both lungs demonstrate progression since previous study. This may represent pneumonia, edema, or ARDS. Probable small bilateral pleural effusions. No pneumothorax. IMPRESSION: Endotracheal tube tip measures 4.7 cm above the carina. Progression of diffuse bilateral airspace infiltrates in the lungs. Electronically Signed   By: Lucienne Capers M.D.   On: 03/23/2016 01:21   Dg Chest Port 1 View  03/21/2016  CLINICAL DATA:  Respiratory failure.  Hypoxia. EXAM: PORTABLE CHEST 1 VIEW COMPARISON:  Single-view of the chest 03/18/2016, 03/19/2016 and 03/20/2016. FINDINGS: ET tube and NG tube remain in place. Bilateral airspace  disease is worse on the left but is improved. There is no pneumothorax or pleural effusion. Heart size is normal. IMPRESSION: Improved left worse than right airspace disease. No new abnormality. Electronically Signed   By: Inge Rise M.D.   On: 03/21/2016 07:17   Dg Chest Port 1 View  03/20/2016  CLINICAL DATA:  Acute respiratory failure with hypoxemia. EXAM: PORTABLE CHEST 1 VIEW COMPARISON:  Chest x-rays dated 03/19/2016 and 03/18/2016. FINDINGS: Endotracheal tube remains adequately positioned with tip just above the level of the carina. Enteric tube passes below the diaphragm. Cardiomediastinal silhouette appears stable in size and configuration. There is persistent central pulmonary vascular congestion and patchy bilateral airspace opacities which are most suggestive of pulmonary edema pattern. Perhaps mildly improved aeration within the left lung. No pneumothorax seen. IMPRESSION: Patchy bilateral airspace opacities, multifocal pneumonia versus pulmonary edema, not significantly changed in overall extent, perhaps slightly improved aeration within the left lung. Favor pulmonary edema. ARDS is another consideration. Electronically Signed   By: Franki Cabot M.D.   On: 03/20/2016 13:20   Dg Chest Port 1 View  03/19/2016  CLINICAL DATA:  Respiratory failure EXAM: PORTABLE CHEST 1 VIEW COMPARISON:  03/19/2016 FINDINGS: The endotracheal tube is 5.5 cm above the carina. Multifocal consolidation persists bilaterally, probably not significantly changed. No pneumothorax. IMPRESSION: Satisfactorily positioned ETT. No significant interval change in the bilateral airspace opacities. Electronically Signed   By: Andreas Newport M.D.   On: 03/19/2016 06:28   Dg Chest Port 1 View  03/19/2016  CLINICAL DATA:  Dyspnea for 2 months, worsened over the past 2 days. EXAM: PORTABLE CHEST 1 VIEW COMPARISON:  03/18/2016 FINDINGS: Multifocal consolidation has mildly worsened from 03/18/2016, now with more confluent  opacity in the central right lung and in the left upper lobe periphery. There may be a left pleural IMPRESSION: Worsening consolidation Electronically Signed   By: Andreas Newport M.D.   On: 03/19/2016 01:19   Dg Chest Portable 1 View  03/18/2016  CLINICAL DATA:  Fever, altered mental status, history of hypertension and diabetes EXAM: PORTABLE CHEST 1 VIEW COMPARISON:  None. FINDINGS: There is extensive infiltrate over the left lung. This obscures the left heart border. Heart size is therefore difficult to establish but appears mildly large. To a lesser degree there is patchy multifocal right-sided infiltrate. No pleural effusions. IMPRESSION: Bilateral left greater than right infiltrates. Bilateral pneumonia is suspected. Radiographic follow-up recommended. Electronically Signed   By: Skipper Cliche M.D.   On: 03/18/2016 18:09    ASSESSMENT AND PLAN    # New-onset atrial fibrillation:  Julian Ross had significant bradycardia  HR 20-50s on IV dilt and metoprolol.  stopped IV dilt ( want to avoid CCB in the setting of LV dysfunction anyway) and continue him on BB. He is currently on metoprolol 25 mg BID. Rate in 100s intermittently goes to 120s. No recurrent bradycardia. Will increase metoprolol to 37.5mg  BID. CHADSVASC score at least 4. He will require long-term anticoagulation. However, we will wait to start oral anticoagulation until after he is also present and it is clear that he will not require any procedures. He has normal renal fucntion and could be started on a DOAC. ( his wife takes eliquis). Consider DCCV after 4 weeks of anticoagulation if still remains in afib. Continue heparin for now.   # Chronic systolic and diastolic heart failure: Julian Ross has known systolic dysfunction with an ejection fraction of 45% on echo in 2008. Echo this admission revealed an EF of 30-35% with focal wall motion abnormalities. Net I&O of negative 10.3 L with 38lb weight loss. Currently on IV lasix 40mg  BID.  He looks euvolemic. Consider changing to po dose. Scr stable. Continue Bidil, BB, and losartan. BP stable.    # CAD: Julian Ross has an extensive history of CAD with up to 16 prior stents. Followed in [place] by cardiologist. Troponin was mildly elevated at 0.13. EKG has not been concerning for active ischemia. He has had several episodes of ventricular ectopy. This seems to have improved on metoprolol. Continue home Plavix and heparin. He is also not on a statin. LDL 67. However, given his CAD history he should still be on at least a low-dose statin. I discussed with the patient who said he cannot tolerate a statin due to bone pain and has tried many. He does not wish to try any new ones currently. Stress test vs cath when stable per Dr. [name]   Principal Problem:   Septic shock [facility]) Active Problems:   CKD (chronic kidney disease), stage III   Edema leg   Diabetes mellitus type 2 in obese (HCC)   Elevated troponin   Anemia   Acute hypoxemic respiratory failure (HCC)   Altered mental status   Atrial fibrillation, new onset (HCC)   Acute on chronic systolic and diastolic heart failure, NYHA class 1 (HCC)   Acute respiratory failure with hypoxemia (HCC)   AKI (acute kidney injury) ([place])   Acute respiratory failure with hypoxia (HCC)    Signed, Bhagat,Bhavinkumar PA-C Pager [phone]   Patient seen and examined. Agree with assessment and plan. Transferred out of MICU.  AF ate controlled. Will plan further titration of losartan to 50 mg bid commencing tomorrow. If BP allows would then titrate bidil to tid dosing over the next several days.    Troy Sine, MD, [facility] 03/27/2016 5:32 PM

## 2016-03-27 NOTE — Progress Notes (Signed)
[facility] TEAM 1 - Stepdown/ICU TEAM Progress Note  Daian Schlegel [id number] DOB: 03-28-1946 DOA: 03/18/2016 PCP: No PCP Per Patient  Admit HPI / Brief Narrative: 70 year old male PMHx Substance Abuse (polypharmacy), partial blindness HTN, Chronic Systolic CHF HLD, DM Type 2, MI, CAD native artery  s/p PCI;   Who presents with progressively worsening shortness of breath over the last 2 days. Patient actually reports having difficulty with shortness of breath over the last 2-3 months with multiple falls which she related to generalized weakness. However in the last 2 days he noted that his voice was deepening and he was congested. Denies coughing during the day but at night while utilizing his CPAP mask he would seem to cough more. Unable to produce any sputum. Also noted complaints of chest tightness like someone was squeezing his chest. He endorses nasal congestion, decreased activity, and malaise. Patient notes previously having pneumonia 2 years ago in which he ended up in the ICU, but this time was not similar in that he was able to cough up mucus.  Upon admission patient is evaluated initial vital signs included temperature 102.40F, heart rate 151, blood pressure 69/49, O2 sats 89%. Chest x-ray and seen have bilateral infiltrates suggestive of pneumonia. Lab work reveals WBC 16.7, lactic acid 4.52, creatinine 2.1 BUN 21. Patient noted weight 2 weeks ago was 220 pounds however tonight was weighed at 240 pounds.  HPI/Subjective: 3/28 A/O x 3 (does not know where)  Assessment/Plan: Acute Respiratory Failure with Hypoxia /HCAP/Severe Sepsis -Complete 10 days of aztreonam per [facility] M -Flutter valve -Out of bed to chair/ambulate -PT; recommends SNF  OSA - on CPAP,  -CPAP per respiratory  Chronic systolic CHF (LVEF [id number] cardiomyopathy -Lasix 40 mg  BID -Metoprolol 37.5 mg bid -Cozaar 50 mg bid -BIDILl 20-30 7.5 mg bid -Strict in and out since admission -10 L -Daily  weight  New onset A fib - improved control -Currently in A. fib but rate controlled. -See chronic systolic CHF  Elevated troponin/Demand ischemia  -Secondary to A. fib with RVR new-onset  Hx MI, CAD s/p PCI - reportedly has 16 stents per wife -Currently not a factor -Continue plavix   Acute on CKD STAGE III -Continue monitor closely  Hypokalemia -Potassium goal> 4 -K Dur 20 mEq  Hypomagnesemia  -Magnesium goal> 2 -Magnesium IV 1 gm  Anemia of chronic disease? -Anemia panel pending  -Transfuse for hemoglobin<8  RA - on plaquenil / prednisone 5mg  QD -Hold home plaquenil -Restart prednisone 5 mg daily (home dose)   DM Type II uncontrolled/Diabetic Neuropathy  -3/19 Hemoglobin A1c = 9.2   -Lantus 8 mg -Resistant SSI Lantus  Acute encephalopathy -Multifactorial to include Home polypharmacy (Xanax), ICU delirium, residual affect from prior CVA? -Per [facility] M note improved significantly  Recurrent tremors/Essential Tremor -> myoclonus per neurology, resolved -Essential tremor not evident on exam  Anxiety  - uses xanax at home, family reports polypharmacy -Patient now 10 days out, should be through withdrawal  Period. -Avoid benzodiazepine   Partial Blindness    Code Status: Partial Family Communication: no family present at time of exam Disposition Plan: SNF    Consultants: [facility] Dr.Daniel Lily Kocher [facility] M   Procedure/Significant Events:  CT Head 3/19 >> no acute process, chronic atrophy & small vessl disease 3/20 echocardiogram;- Left ventricle: mild LVH. -LVEF  30% to 35%. Global hypokinesis with severe  inferior hypokinesis to akinesis. - Pulmonary arteries: PA peak pressure: 35 mm Hg (S). 3/20 RLE Doppler;  negative DVT.-Complicated mixed cystic area noted in the right popliteal fossa, Atypical baker's cyst? EEG 3/21 > slowing but no seizure EEG 3/22> no seizure 3/24 CT head > chronic microvascular change, nothing  acute 3/24 CXR > worsenign bilateral airspace disease   Culture Blood cx 3/19 >> RVP 3/19 >> neg Flu 3/19 >> neg   Antibiotics: Vanc 3/19 >> 3/22 Aztreonam 3/19 >>  Levaquin 3/19 >> 3/22  DVT prophylaxis: Lovenox   Devices    LINES / TUBES:  ETT 3/20 > 3/23>>>3/26 Foley 3/20    Continuous Infusions: . dexmedetomidine Stopped (03/26/16 1000)  . dextrose 30 mL/hr at 03/27/16 0700  . heparin 2,000 Units/hr (03/27/16 1638)    Objective: VITAL SIGNS: Temp: 98.8 F (37.1 C) (03/28 1450) Temp Source: Oral (03/28 1450) BP: 144/88 mmHg (03/28 1450) Pulse Rate: 87 (03/28 1450) SPO2; FIO2:   Intake/Output Summary (Last 24 hours) at 03/27/16 1852 Last data filed at 03/27/16 1748  Gross per 24 hour  Intake   1610 ml  Output   3820 ml  Net  -2210 ml     Exam: General:A/O x 3 (does not know where) clearly some confusion remains (baseline? Patient in mittens), No acute respiratory distress Eyes: Negative headache, negative scleral hemorrhage ENT: Negative Runny nose, negative gingival bleeding, Neck:  Negative scars, masses, torticollis, lymphadenopathy, JVD Lungs: Clear to auscultation bilaterally without wheezes or crackles Cardiovascular: Irregular irregular rhythm and rate, negative murmur gallop or rub normal S1 and S2 Abdomen:negative abdominal pain, nondistended, positive soft, bowel sounds, no rebound, no ascites, no appreciable mass Extremities: No significant cyanosis, clubbing, or edema bilateral lower extremities Psychiatric:  Negative depression, negative anxiety, negative fatigue, negative mania Neurologic:  Cranial nerves II through XII intact, tongue/uvula midline, all extremities muscle strength 5/5, sensation intact throughout, negative dysarthria, negative expressive aphasia, negative receptive aphasia.   Data Reviewed: Basic Metabolic Panel:  Recent Labs Lab 03/22/16 1900  03/23/16 1621  03/24/16 1435 03/24/16 2008 03/25/16 0300  03/25/16 0805 03/25/16 1939 03/26/16 0248 03/27/16 0218  NA  --   < >  --   --  149*  --  149*  --  149* 147* 142  K  --   < >  --   < > 3.1*  --  2.9*  --  3.7 3.5 3.6  CL  --   < >  --   --  112*  --  112*  --  109 109 108  CO2  --   < >  --   --  24  --  27  --  27 25 20*  GLUCOSE  --   < >  --   --  302*  --  261*  --  183* 173* 137*  BUN  --   < >  --   --  46*  --  41*  --  36* 32* 29*  CREATININE  --   < >  --   --  1.21  --  1.13  --  1.03 1.05 1.01  CALCIUM  --   < >  --   --  8.9  --  8.8*  --  9.3 9.0 9.2  MG 2.1  --  2.0  --   --   --   --  2.1  --   --  1.9  PHOS  --   --  2.3*  --  2.0* 2.1*  --  1.5*  --   --  3.2  < > =  values in this interval not displayed. Liver Function Tests: No results for input(s): AST, ALT, ALKPHOS, BILITOT, PROT, ALBUMIN in the last 168 hours. No results for input(s): LIPASE, AMYLASE in the last 168 hours.  Recent Labs Lab 03/21/16 1815 03/23/16 1333  AMMONIA 43* 27   CBC:  Recent Labs Lab 03/21/16 0254  03/23/16 0325 03/24/16 0406 03/25/16 0300 03/26/16 0248 03/27/16 0218  WBC 10.7*  < > 11.9* 12.2* 12.0* 15.2* 16.4*  NEUTROABS 9.3*  --  9.7* 9.6* 9.4*  --   --   HGB 9.3*  < > 9.6* 9.8* 9.4* 10.7* 11.9*  HCT 28.5*  < > 29.9* 29.9* 30.2* 32.4* 35.0*  MCV 92.2  < > 92.9 93.7 93.8 93.4 93.1  PLT 211  < > 269 269 334 288 354  < > = values in this interval not displayed. Cardiac Enzymes: No results for input(s): CKTOTAL, CKMB, CKMBINDEX, TROPONINI in the last 168 hours. BNP (last 3 results)  Recent Labs  03/19/16 0010  BNP 385.9*    ProBNP (last 3 results) No results for input(s): PROBNP in the last 8760 hours.  CBG:  Recent Labs Lab 03/27/16 0029 03/27/16 0333 03/27/16 0814 03/27/16 1150 03/27/16 1635  GLUCAP 130* 147* 152* 218* 200*    Recent Results (from the past 240 hour(s))  Blood Culture (routine x 2)     Status: None   Collection Time: 03/18/16  4:45 PM  Result Value Ref Range Status   Specimen  Description BLOOD LEFT ANTECUBITAL  Final   Special Requests BOTTLES DRAWN AEROBIC AND ANAEROBIC 5ML  Final   Culture NO GROWTH 5 DAYS  Final   Report Status 03/23/2016 FINAL  Final  Blood Culture (routine x 2)     Status: None   Collection Time: 03/18/16  5:15 PM  Result Value Ref Range Status   Specimen Description BLOOD LEFT WRIST  Final   Special Requests BOTTLES DRAWN AEROBIC AND ANAEROBIC 5ML  Final   Culture NO GROWTH 5 DAYS  Final   Report Status 03/23/2016 FINAL  Final  Respiratory virus antigens panel     Status: None   Collection Time: 03/18/16  7:46 PM  Result Value Ref Range Status   Source - RVPAN NASAL SWAB  Corrected   Respiratory Syncytial Virus A Negative Negative Final   Respiratory Syncytial Virus B Negative Negative Final   Influenza A Negative Negative Final   Influenza B Negative Negative Final   Parainfluenza 1 Negative Negative Final   Parainfluenza 2 Negative Negative Final   Parainfluenza 3 Negative Negative Final   Metapneumovirus Negative Negative Final   Rhinovirus Negative Negative Final   Adenovirus Negative Negative Final    Comment: (NOTE) Performed At: [facility] [place], [place] [id number] Lindon Romp MD [id number]   Gram stain     Status: None   Collection Time: 03/19/16  9:05 AM  Result Value Ref Range Status   Specimen Description TRACHEAL ASPIRATE  Final   Special Requests NONE  Final   Gram Stain   Final    ABUNDANT WBC PRESENT, PREDOMINANTLY PMN NO ORGANISMS SEEN    Report Status 03/19/2016 FINAL  Final  Culture, respiratory (NON-Expectorated)     Status: None   Collection Time: 03/19/16  9:05 AM  Result Value Ref Range Status   Specimen Description TRACHEAL ASPIRATE  Final   Special Requests Normal  Final   Gram Stain   Final    ABUNDANT WBC PRESENT,BOTH PMN AND  MONONUCLEAR NO SQUAMOUS EPITHELIAL CELLS SEEN NO ORGANISMS SEEN Performed at [facility] Performed at [facility]     Culture   Final    NO GROWTH 2 DAYS Performed at [facility]    Report Status 03/21/2016 FINAL  Final  Respiratory virus panel     Status: None   Collection Time: 03/19/16  9:05 AM  Result Value Ref Range Status   Source - RVPAN TRACHEAL ASPIRATE  Corrected   Respiratory Syncytial Virus A Negative Negative Final   Respiratory Syncytial Virus B Negative Negative Final   Influenza A Negative Negative Final   Influenza B Negative Negative Final   Parainfluenza 1 Negative Negative Final   Parainfluenza 2 Negative Negative Final   Parainfluenza 3 Negative Negative Final   Metapneumovirus Negative Negative Final   Rhinovirus Negative Negative Final   Adenovirus Negative Negative Final    Comment: (NOTE) Performed At: [facility] [place], [place] [id number] Lindon Romp MD [id number]   MRSA PCR Screening     Status: Abnormal   Collection Time: 03/19/16  3:13 PM  Result Value Ref Range Status   MRSA by PCR POSITIVE (A) NEGATIVE Final    Comment:        The GeneXpert MRSA Assay (FDA approved for NASAL specimens only), is one component of a comprehensive MRSA colonization surveillance program. It is not intended to diagnose MRSA infection nor to guide or monitor treatment for MRSA infections. RESULT CALLED TO, READ BACK BY AND VERIFIED WITH: L.WILSON,RN AT 1850 BY L.PITT      Studies:  Recent x-ray studies have been reviewed in detail by the Attending Physician  Scheduled Meds:  Scheduled Meds: . antiseptic oral rinse  7 mL Mouth Rinse BID  . clopidogrel  75 mg Per Tube Daily  . famotidine  20 mg Per Tube Daily  . feeding supplement (ENSURE ENLIVE)  237 mL Oral Q24H  . folic acid  1 mg Oral Daily  . furosemide  40 mg Intravenous Q12H  . gabapentin  300 mg Oral TID  . insulin aspart  0-20 Units Subcutaneous 6 times per day  . insulin glargine  8 Units Subcutaneous Daily  . isosorbide-hydrALAZINE  1 tablet Oral BID  .  levETIRAcetam  1,000 mg Intravenous Q12H  . [START ON 03/28/2016] losartan  50 mg Oral BID  . magnesium sulfate 1 - 4 g bolus IVPB  1 g Intravenous Once  . metoprolol tartrate  37.5 mg Oral BID  . PARoxetine  40 mg Oral Daily  . potassium chloride  20 mEq Oral Once  . predniSONE  5 mg Oral Q breakfast  . topiramate  25 mg Oral BID    Time spent on care of this patient: 40 mins   WOODS, Geraldo Docker , MD  [facility] Office  [phone] Pager - [phone]  On-Call/Text Page:      [email or web address]      password [email or web address]  If 7PM-7AM, please contact night-coverage [email or web address] Password [facility] 03/27/2016, 6:52 PM   LOS: 9 days   Care during the described time interval was provided by me .  I have reviewed this patient's available data, including medical history, events of note, physical examination, and all test results as part of my evaluation. I have personally reviewed and interpreted all radiology studies.   Dia Crawford, MD [phone] Pager

## 2016-03-28 ENCOUNTER — Inpatient Hospital Stay (HOSPITAL_COMMUNITY): Payer: Medicare Other

## 2016-03-28 DIAGNOSIS — R6520 Severe sepsis without septic shock: Secondary | ICD-10-CM

## 2016-03-28 DIAGNOSIS — I255 Ischemic cardiomyopathy: Secondary | ICD-10-CM

## 2016-03-28 LAB — HEPARIN LEVEL (UNFRACTIONATED): Heparin Unfractionated: 0.64 IU/mL (ref 0.30–0.70)

## 2016-03-28 LAB — GLUCOSE, CAPILLARY
GLUCOSE-CAPILLARY: 165 mg/dL — AB (ref 65–99)
GLUCOSE-CAPILLARY: 92 mg/dL (ref 65–99)
Glucose-Capillary: 141 mg/dL — ABNORMAL HIGH (ref 65–99)
Glucose-Capillary: 176 mg/dL — ABNORMAL HIGH (ref 65–99)
Glucose-Capillary: 307 mg/dL — ABNORMAL HIGH (ref 65–99)
Glucose-Capillary: 424 mg/dL — ABNORMAL HIGH (ref 65–99)

## 2016-03-28 LAB — CBC WITH DIFFERENTIAL/PLATELET
BASOS ABS: 0.2 10*3/uL — AB (ref 0.0–0.1)
BASOS PCT: 1 %
EOS ABS: 0.3 10*3/uL (ref 0.0–0.7)
Eosinophils Relative: 2 %
HCT: 35.3 % — ABNORMAL LOW (ref 39.0–52.0)
Hemoglobin: 11.8 g/dL — ABNORMAL LOW (ref 13.0–17.0)
Lymphocytes Relative: 11 %
Lymphs Abs: 1.9 10*3/uL (ref 0.7–4.0)
MCH: 30.3 pg (ref 26.0–34.0)
MCHC: 33.4 g/dL (ref 30.0–36.0)
MCV: 90.7 fL (ref 78.0–100.0)
MONO ABS: 1 10*3/uL (ref 0.1–1.0)
Monocytes Relative: 6 %
NEUTROS PCT: 80 %
Neutro Abs: 13.5 10*3/uL — ABNORMAL HIGH (ref 1.7–7.7)
PLATELETS: 382 10*3/uL (ref 150–400)
RBC: 3.89 MIL/uL — AB (ref 4.22–5.81)
RDW: 14.4 % (ref 11.5–15.5)
WBC: 16.9 10*3/uL — AB (ref 4.0–10.5)

## 2016-03-28 LAB — COMPREHENSIVE METABOLIC PANEL
ALK PHOS: 61 U/L (ref 38–126)
ALT: 39 U/L (ref 17–63)
ANION GAP: 13 (ref 5–15)
AST: 46 U/L — ABNORMAL HIGH (ref 15–41)
Albumin: 2.2 g/dL — ABNORMAL LOW (ref 3.5–5.0)
BILIRUBIN TOTAL: 0.4 mg/dL (ref 0.3–1.2)
BUN: 21 mg/dL — ABNORMAL HIGH (ref 6–20)
CALCIUM: 9.2 mg/dL (ref 8.9–10.3)
CO2: 21 mmol/L — ABNORMAL LOW (ref 22–32)
CREATININE: 1.08 mg/dL (ref 0.61–1.24)
Chloride: 108 mmol/L (ref 101–111)
Glucose, Bld: 166 mg/dL — ABNORMAL HIGH (ref 65–99)
Potassium: 3.1 mmol/L — ABNORMAL LOW (ref 3.5–5.1)
Sodium: 142 mmol/L (ref 135–145)
TOTAL PROTEIN: 6.4 g/dL — AB (ref 6.5–8.1)

## 2016-03-28 LAB — MAGNESIUM: MAGNESIUM: 2.1 mg/dL (ref 1.7–2.4)

## 2016-03-28 MED ORDER — FENTANYL CITRATE (PF) 100 MCG/2ML IJ SOLN: 12.5000 ug | INTRAMUSCULAR | Status: DC | PRN

## 2016-03-28 MED ORDER — LORAZEPAM 2 MG/ML IJ SOLN: 1.0000 mg | INTRAMUSCULAR | Status: DC | PRN

## 2016-03-28 MED ORDER — OFF THE BEAT BOOK
Freq: Once | Status: AC
  Administered 2016-03-28: 23:00:00
  Filled 2016-03-28: qty 1

## 2016-03-28 MED ORDER — LEVETIRACETAM 500 MG PO TABS
1000.0000 mg | ORAL_TABLET | Freq: Two times a day (BID) | ORAL | Status: DC
  Administered 2016-03-28 – 2016-03-30 (×4): 1000 mg via ORAL
  Filled 2016-03-28 (×5): qty 2

## 2016-03-28 MED ORDER — GABAPENTIN 100 MG PO CAPS
100.0000 mg | ORAL_CAPSULE | Freq: Two times a day (BID) | ORAL | Status: DC
Start: 2016-03-29 — End: 2016-03-30
  Administered 2016-03-29 – 2016-03-30 (×3): 100 mg via ORAL
  Filled 2016-03-28 (×3): qty 1

## 2016-03-28 MED ORDER — FUROSEMIDE 40 MG PO TABS
40.0000 mg | ORAL_TABLET | Freq: Two times a day (BID) | ORAL | Status: DC
  Administered 2016-03-28 – 2016-04-02 (×11): 40 mg via ORAL
  Filled 2016-03-28 (×12): qty 1

## 2016-03-28 MED ORDER — ISOSORB DINITRATE-HYDRALAZINE 20-37.5 MG PO TABS
1.0000 | ORAL_TABLET | Freq: Three times a day (TID) | ORAL | Status: DC
  Administered 2016-03-28 – 2016-04-02 (×15): 1 via ORAL
  Filled 2016-03-28 (×17): qty 1

## 2016-03-28 NOTE — Progress Notes (Signed)
   03/28/16 1559  PT Visit Information  Last PT Received On 03/28/16  Assistance Needed +2  Reason Eval/Treat Not Completed Fatigue/lethargy limiting ability to participate (RN medicated pt with ativan currently sleeping.  Will continue efforts per POC.  )  History of Present Illness 71 yo M with CAD s/p MI and PCI, chronic systolic heart failure, hypertension, hyperlipidemia, and diabetes mellitus who is here with hypoxic respiratory failure and pneumonia, new-onset atrial fibrillation.  Governor Rooks, PTA pager [phone]

## 2016-03-28 NOTE — Clinical Social Work Placement (Signed)
   CLINICAL SOCIAL WORK PLACEMENT  NOTE  Date:  03/28/2016  Patient Details  Name: Julian Ross MRN: [id number] Date of Birth: 07-May-1946  Clinical Social Work is seeking post-discharge placement for this patient at the [place] level of care (*CSW will initial, date and re-position this form in  chart as items are completed):  Yes   Patient/family provided with [facility] Work Department's list of facilities offering this level of care within the geographic area requested by the patient (or if unable, by the patient's family).  Yes   Patient/family informed of their freedom to choose among providers that offer the needed level of care, that participate in Medicare, Medicaid or managed care program needed by the patient, have an available bed and are willing to accept the patient.  Yes   Patient/family informed of [facility]'s ownership interest in [facility] and [facility], as well as of the fact that they are under no obligation to receive care at these facilities.  PASRR submitted to EDS on 03/28/16     PASRR number received on 03/28/16     Existing PASRR number confirmed on       FL2 transmitted to all facilities in geographic area requested by pt/family on 03/28/16     FL2 transmitted to all facilities within larger geographic area on       Patient informed that his/her managed care company has contracts with or will negotiate with certain facilities, including the following:            Patient/family informed of bed offers received.  Patient chooses bed at       Physician recommends and patient chooses bed at      Patient to be transferred to   on  .  Patient to be transferred to facility by       Patient family notified on   of transfer.  Name of family member notified:        PHYSICIAN       Additional Comment:    _______________________________________________ [name], LCSW 03/28/2016, 2:44 PM

## 2016-03-28 NOTE — Clinical Social Work Note (Signed)
Clinical Social Work Assessment  Patient Details  Name: Julian Ross MRN: [id number] Date of Birth: 1946-01-23  Date of referral:  03/28/16               Reason for consult:  Discharge Planning, Facility Placement                Permission sought to share information with:  Facility Sport and exercise psychologist, Family Supports Permission granted to share information::  Yes, Verbal Permission Granted  Name::     Information systems manager::  SNF  Relationship::     Contact Information:     Housing/Transportation Living arrangements for the past 2 months:  Apartment Source of Information:  Patient, Spouse Patient Interpreter Needed:  None Criminal Activity/Legal Involvement Pertinent to Current Situation/Hospitalization:  No - Comment as needed Significant Relationships:  Spouse Lives with:  Spouse Do you feel safe going back to the place where you live?  Yes Need for family participation in patient care:  Yes (Comment)  Care giving concerns:  The patient and wife are both agreeable to SNF placement at discharge as the patient will not be safely managed at home given his current care needs.   Social Worker assessment / plan:  CSW met with the patient at bedside. The patient was able to engage with CSW. He shared that he and his wife has been living between [place] and [place]. He states that he would be agreeable to SNF placement for rehab and asks that [facility] contact his wife to discuss this further. The patient's wife Julian Ross shares that she would like the patient to go to [facility] or [place] at discharge and would prefer a private room. CSW explained SNF search/placement process and answered Julian Ross's questions. CSW will followup with available bed offers.  Employment status:  Retired Forensic scientist:  [employer] PT Recommendations:  [place] / Referral to community resources:  [place]  Patient/Family's Response to care:  The patient and family  state that they are happy with the care that has been provided. The patient and wife are appreciative of CSW's assistance with finding a SNF for the patient.  Patient/Family's Understanding of and Emotional Response to Diagnosis, Current Treatment, and Prognosis:  The patient's wife appears to have a good understanding of why the patient was admitted and understands that the patient will benefit from SNF placement at discharge.   Emotional Assessment Appearance:  Appears stated age Attitude/Demeanor/Rapport:  Unable to Assess Affect (typically observed):  Unable to Assess Orientation:  Oriented to Self, Oriented to Place Alcohol / Substance use:  Not Applicable Psych involvement (Current and /or in the community):  No (Comment)  Discharge Needs  Concerns to be addressed:  Discharge Planning Concerns Readmission within the last 30 days:  No Current discharge risk:  Physical Impairment, Chronically ill, Cognitively Impaired Barriers to Discharge:  Continued Medical Work up   Julian Noel, LCSW 03/28/2016, 2:23 PM

## 2016-03-28 NOTE — Progress Notes (Signed)
ANTICOAGULATION CONSULT NOTE - Follow Up Consult  Pharmacy Consult for heparin  Indication: atrial fibrillation  Allergies  Allergen Reactions  . Ace Inhibitors Cough  . Atenolol Other (See Comments)    Unknown reaction  . Contrast Media [Iodinated Diagnostic Agents] Rash  . Penicillins Hives and Rash    Has patient had a PCN reaction causing immediate rash, facial/tongue/throat swelling, SOB or lightheadedness with hypotension: Yes Has patient had a PCN reaction causing severe rash involving mucus membranes or skin necrosis: No Has patient had a PCN reaction that required hospitalization pt was in the hospital at time of last reaction - heart attack Has patient had a PCN reaction occurring within the last 10 years: No If all of the above answers are "NO", then may proceed with Cephalosporin use.    Patient Measurements: Height: 5\' 11"  (180.3 cm) Weight: 202 lb 6.4 oz (91.808 kg) (scale B) IBW/kg (Calculated) : 75.3  Vital Signs: Temp: 98.2 F (36.8 C) (03/29 0633) Temp Source: Oral (03/29 [id number]) BP: 153/64 mmHg (03/29 [id number]) Pulse Rate: 82 (03/29 0633)  Labs:  Recent Labs  03/26/16 0248 03/27/16 0218 03/28/16 0344  HGB 10.7* 11.9* 11.8*  HCT 32.4* 35.0* 35.3*  PLT 288 354 382  HEPARINUNFRC 0.53 0.52 0.64  CREATININE 1.05 1.01 1.08    Estimated Creatinine Clearance: 74.8 mL/min (by C-G formula based on Cr of 1.08).  Assessment: 70 yo M continues on heparin for afib. Heparin level remains therapeutic at 0.64. CBC is stable and no bleeding noted.   Goal of Therapy:  Heparin level 0.3-0.7 units/ml Monitor platelets by anticoagulation protocol: Yes   Plan:  - Continue heparin gtt 2000 units/hr - Daily heparin level and CBC - F/u plans for oral anticoagulation  Thank you Anette Guarneri, PharmD [phone]  03/28/2016 10:15 AM

## 2016-03-28 NOTE — Progress Notes (Signed)
Patient Name: Julian Ross Date of Encounter: 03/28/2016  Principal Problem:   Septic shock ([place]) Active Problems:   CKD (chronic kidney disease), stage III   Edema leg   Diabetes mellitus type 2 in obese (HCC)   Elevated troponin   Anemia   Acute hypoxemic respiratory failure (HCC)   Altered mental status   Atrial fibrillation, new onset (HCC)   Acute on chronic systolic and diastolic heart failure, NYHA class 1 (HCC)   Acute respiratory failure with hypoxemia (HCC)   AKI (acute kidney injury) (HCC)   Acute respiratory failure with hypoxia (HCC)   Chronic systolic CHF (congestive heart failure) (HCC)   Cardiomyopathy, ischemic   New onset atrial fibrillation ([place])   Demand ischemia ([place])   Acute renal failure superimposed on stage 3 chronic kidney disease (HCC)   Hypokalemia   Rheumatoid arthritis of multiple sites with negative rheumatoid factor (HCC)   Acute encephalopathy   Anxiety state    Patient Profile:25M with CAD s/p MI and PCI, chronic systolic heart failure, hypertension, hyperlipidemia, and diabetes mellitus who is here with hypoxic respiratory failure and pneumonia, new-onset atrial fibrillation.   SUBJECTIVE: He feels well.  Denies chest pain or SOB.  He is frustrated and states that he wants to go home.   OBJECTIVE Filed Vitals:   03/27/16 2109 03/28/16 0022 03/28/16 0633 03/28/16 0644  BP: 158/93 125/76 153/64   Pulse: 116 70 82   Temp: 98 F (36.7 C) 98.4 F (36.9 C) 98.2 F (36.8 C)   TempSrc: Oral Oral Oral   Resp: 18 20 20    Height:      Weight:    202 lb 6.4 oz (91.808 kg)  SpO2: 100% 99% 100%     Intake/Output Summary (Last 24 hours) at 03/28/16 0914 Last data filed at 03/28/16 0242  Gross per 24 hour  Intake   1035 ml  Output   2575 ml  Net  -1540 ml   Filed Weights   03/27/16 0500 03/27/16 1450 03/28/16 0644  Weight: 203 lb 0.7 oz (92.1 kg) 202 lb 3.2 oz (91.717 kg) 202 lb 6.4 oz (91.808 kg)    PHYSICAL EXAM General: Well  developed, well nourished, male in no acute distress. Head: Normocephalic, atraumatic.  Neck: Supple without bruits, No JVD. Lungs:  Resp regular and unlabored, CTA. Heart: RRR, S1, S2, no S3, S4, or murmur; no rub. Abdomen: Soft, non-tender, non-distended, BS + x 4.  Extremities: No clubbing, cyanosis, No edema.  Neuro: Alert and oriented X 3. Moves all extremities spontaneously. Psych: Normal affect.  LABS: CBC: Recent Labs  03/27/16 0218 03/28/16 0344  WBC 16.4* 16.9*  NEUTROABS  --  13.5*  HGB 11.9* 11.8*  HCT 35.0* 35.3*  MCV 93.1 90.7  PLT 354 382   INR:No results for input(s): INR in the last 72 hours. Basic Metabolic Panel: Recent Labs  03/27/16 0218 03/28/16 0344  NA 142 142  K 3.6 3.1*  CL 108 108  CO2 20* 21*  GLUCOSE 137* 166*  BUN 29* 21*  CREATININE 1.01 1.08  CALCIUM 9.2 9.2  MG 1.9 2.1  PHOS 3.2  --    Liver Function Tests: Recent Labs  03/28/16 0344  AST 46*  ALT 39  ALKPHOS 61  BILITOT 0.4  PROT 6.4*  ALBUMIN 2.2*   BNP:  B NATRIURETIC PEPTIDE  Date/Time Value Ref Range Status  03/19/2016 12:10 AM 385.9* 0.0 - 100.0 pg/mL Final   Anemia Panel: Recent Labs  03/27/16 2000  VITAMINB12 876  FOLATE 18.7  FERRITIN 421*  TIBC 256  IRON 39*  RETICCTPCT 2.1    TELE:  Afib 100-120       Current Medications:  . antiseptic oral rinse  7 mL Mouth Rinse BID  . clopidogrel  75 mg Per Tube Daily  . famotidine  20 mg Per Tube Daily  . feeding supplement (ENSURE ENLIVE)  237 mL Oral Q24H  . folic acid  1 mg Oral Daily  . furosemide  40 mg Intravenous Q12H  . gabapentin  300 mg Oral TID  . insulin aspart  0-20 Units Subcutaneous 6 times per day  . insulin glargine  8 Units Subcutaneous Daily  . isosorbide-hydrALAZINE  1 tablet Oral BID  . levETIRAcetam  1,000 mg Intravenous Q12H  . losartan  50 mg Oral BID  . metoprolol tartrate  37.5 mg Oral BID  . PARoxetine  40 mg Oral Daily  . predniSONE  5 mg Oral Q breakfast  . topiramate  25  mg Oral BID   . dexmedetomidine Stopped (03/26/16 1000)  . dextrose 30 mL/hr at 03/27/16 2240  . heparin 2,000 Units/hr (03/28/16 0543)    ASSESSMENT AND PLAN: Principal Problem:   Septic shock (HCC) Active Problems:   CKD (chronic kidney disease), stage III   Edema leg   Diabetes mellitus type 2 in obese (HCC)   Elevated troponin   Anemia   Acute hypoxemic respiratory failure (HCC)   Altered mental status   Atrial fibrillation, new onset (HCC)   Acute on chronic systolic and diastolic heart failure, NYHA class 1 (HCC)   Acute respiratory failure with hypoxemia (HCC)   AKI (acute kidney injury) ([place])   Acute respiratory failure with hypoxia (HCC)   Chronic systolic CHF (congestive heart failure) (HCC)   Cardiomyopathy, ischemic   New onset atrial fibrillation ([place])   Demand ischemia ([place])   Acute renal failure superimposed on stage 3 chronic kidney disease (HCC)   Hypokalemia   Rheumatoid arthritis of multiple sites with negative rheumatoid factor (HCC)   Acute encephalopathy   Anxiety state   1. New onset atrial fibrillation: On metoprolol, dose increased to 37.5mg  BID.  He cannot tolerate IV diltiazem as it caused significant bradycardia.  He is rate controlled with transient elevation to 120's. CHADSVASC score at least 4. He will require long term anticoagulation, MD to advise when to start.  He will need DCCV after 4 weeks of anticoagulation.    2. Chronic systolic and diastolic heart failure: Echo this admission reveals EF of 30-35% with focal wall motion abnormalities.  Weight down to 202 lbs.  He was 240 upon admission.  He is still on IV furosemide, can switch to po as he appears euvolemic on exam. Cr is stable.  Continue Bidil, BB and ARB.    3. CAD: Mr. Heyser has an extensive history of CAD with up to 16 prior stents. Followed in [place] by cardiologist. Troponin was mildly elevated at 0.13 upon admission. EKG has not been concerning for active ischemia. He has  had several episodes of ventricular ectopy. This seems to have improved on metoprolol. Continue home Plavix and heparin. He is also not on a statin. LDL 67. However, given his CAD history he should still be on at least a low-dose statin. I discussed with the patient who said he cannot tolerate a statin due to bone pain and has tried many. He does not wish to try any new ones currently. Stress  test vs cath when stable per Dr. [name].  MD to advise ischemic evaluation.    Signed, Arbutus Leas , NP 9:14 AM 03/28/2016 Pager [phone]  Patient seen and examined. Agree with assessment and plan. BP stable; AF rate 70 - 116;  Now on increased losartan at 50 mg bid for more optimal ARB dosing.  With BP 153/64 will titrate bidil to every 8 hrs from bid dosing. K 3.1 needs repletion to ~  4; check Mg.  If HR remains elevated would titrate metoprolol to 50 mg bid but will not do this today.  I spoke with wife in patient's room  and also with son on the phone who is a neurosurgical PA.    Troy Sine, MD, [facility] 03/28/2016 10:21 AM

## 2016-03-28 NOTE — Progress Notes (Signed)
[facility] TEAM 1 - Stepdown/ICU TEAM Progress Note  Julian Ross [id number] DOB: 03-23-1946 DOA: 03/18/2016 PCP: No PCP Per Patient  Admit HPI / Brief Narrative: 70 year old male PMHx Substance Abuse (polypharmacy), partial blindness HTN, Chronic Systolic CHF HLD, DM Type 2, MI, CAD native artery  s/p PCI;   Who presents with progressively worsening shortness of breath over the last 2 days. Patient actually reports having difficulty with shortness of breath over the last 2-3 months with multiple falls which she related to generalized weakness. However in the last 2 days he noted that his voice was deepening and he was congested. Denies coughing during the day but at night while utilizing his CPAP mask he would seem to cough more. Unable to produce any sputum. Also noted complaints of chest tightness like someone was squeezing his chest. He endorses nasal congestion, decreased activity, and malaise. Patient notes previously having pneumonia 2 years ago in which he ended up in the ICU, but this time was not similar in that he was able to cough up mucus.  Upon admission patient is evaluated initial vital signs included temperature 102.44F, heart rate 151, blood pressure 69/49, O2 sats 89%. Chest x-ray and seen have bilateral infiltrates suggestive of pneumonia. Lab work reveals WBC 16.7, lactic acid 4.52, creatinine 2.1 BUN 21. Patient noted weight 2 weeks ago was 220 pounds however tonight was weighed at 240 pounds.  HPI/Subjective:   thinks that he is in [place]  not able to orient completely No other specific issue per RN   Assessment/Plan: Acute Respiratory Failure with Hypoxia /HCAP/Severe Sepsis -Complete 10 days of aztreonam per PCCM -Flutter valve -Out of bed to chair/ambulate -PT; recommends SNF  OSA - on CPAP,  -CPAP per respiratory  Chronic systolic CHF (LVEF [id number] cardiomyopathy -Lasix 40 mg  BID -Metoprolol 37.5 mg bid -Cozaar 50 mg bid  -BIDIL 20-30  7.5 mg bid -Strict in and out since admission -10.6 L so far since admission -Daily weight has dropped from 208-->202 pounds  New onset A fib - improved control -Currently in A. fib but rate controlled. -See chronic systolic CHF  Elevated troponin/Demand ischemia  -Secondary to A. fib with RVR new-onset  Hx MI, CAD s/p PCI - reportedly has 16 stents per wife -Currently not a factor -Continue plavix  75 daily  Acute on CKD STAGE III -Continue monitor closely  Hypokalemia -Potassium goal> 4 -K Dur 20 mEq  Hypomagnesemia  -Magnesium goal> 2 -Magnesium 03/28/16 = 2.1. Replace orally Mag-Ox 400 daily -Magnesium IV 1 gm  Anemia of chronic disease? -Anemia panel   showed iron level  39, saturation ratio 15 - anemia is not microcytic however -Transfuse for hemoglobin<8  RA - on plaquenil / prednisone 5mg  QD -Hold home plaquenil -Restart prednisone 5 mg daily (home dose)  -needs to follow-up with rheumatologist as an outpatient  DM Type II uncontrolled/Diabetic Neuropathy  -3/19 Hemoglobin A1c = 9.2    -Lantus 8 mg - CBG range =92-141  Acute encephalopathy  -Multifactorial to include Home polypharmacy (Xanax), ICU delirium, residual affect from prior CVA? -Per PCCM note improved significantly  Recurrent tremors/Essential Tremor -> myoclonus per neurology, resolved -Essential tremor not evident on exam  Anxiety  , chronic pain, polypharmacy - uses xanax at home, family reports polypharmacy -Patient now 10 days out, should be through withdrawal  Period. -Avoid benzodiazepine  -taper Gabapentin 300 tid-->100 bid, Fentanyl from 12.5-25-->12.5, lorazepam  2 mg every 4-1 mg every 4  we'll continue to taper and  delineate his medications as appropriate-I had a 10 minute discussion with his son who is a physician and explained the rationale regarding the same Patient will probably need 48-72 hours more in my estimation for sustained improvement in mental state Son tells me  that 10 years ago a similar episode occurred when he had a cardiac catheterization and he took one week  to come back to normal mentation    Partial Blindness    Code Status: Partial Family Communication:  long discussion with son 03/28/16 Disposition Plan: SNF   >35 minutes  Consultants: [facility] Dr.Daniel Lily Kocher [facility]   Procedure/Significant Events:  CT Head 3/19 >> no acute process, chronic atrophy & small vessl disease 3/20 echocardiogram;- Left ventricle: mild LVH. -LVEF  30% to 35%. Global hypokinesis with severe  inferior hypokinesis to akinesis. - Pulmonary arteries: PA peak pressure: 35 mm Hg (S). 3/20 RLE Doppler; negative DVT.-Complicated mixed cystic area noted in the right popliteal fossa, Atypical baker's cyst? EEG 3/21 > slowing but no seizure EEG 3/22> no seizure 3/24 CT head > chronic microvascular change, nothing acute 3/24 CXR > worsenign bilateral airspace disease   Culture Blood cx 3/19 >> RVP 3/19 >> neg Flu 3/19 >> neg   Antibiotics: Vanc 3/19 >> 3/22 Aztreonam 3/19 >>  Levaquin 3/19 >> 3/22  DVT prophylaxis: Lovenox   Devices    LINES / TUBES:  ETT 3/20 > 3/23>>>3/26 Foley 3/20    Continuous Infusions: . dexmedetomidine Stopped (03/26/16 1000)  . dextrose 30 mL/hr at 03/27/16 2240  . heparin 2,000 Units/hr (03/28/16 0543)    Objective: VITAL SIGNS: Temp: 98.4 F (36.9 C) (03/29 1230) Temp Source: Oral (03/29 1230) BP: 156/84 mmHg (03/29 1230) Pulse Rate: 95 (03/29 1230) SPO2; FIO2:   Intake/Output Summary (Last 24 hours) at 03/28/16 1545 Last data filed at 03/28/16 0900  Gross per 24 hour  Intake    615 ml  Output   1275 ml  Net   -660 ml     Exam: General:A/O x 3 (does not know where) clearly some confusion remains (baseline? Patient in mittens), No acute respiratory distress Eyes: Negative headache, negative scleral hemorrhage ENT: Negative Runny nose, negative gingival  bleeding, Neck:  Negative scars, masses, torticollis, lymphadenopathy, JVD Lungs: Clear to auscultation bilaterally without wheezes or crackles Cardiovascular: Irregular irregular rhythm and rate, negative murmur gallop or rub normal S1 and S2 Abdomen:negative abdominal pain, nondistended, positive soft, bowel sounds, no rebound, no ascites, no appreciable mass Extremities: No significant cyanosis, clubbing, or edema bilateral lower extremities Psychiatric:  Negative depression, negative anxiety, negative fatigue, negative mania Neurologic:  Cranial nerves II through XII intact, tongue/uvula midline, all extremities muscle strength 5/5, sensation intact throughout, negative dysarthria, negative expressive aphasia, negative receptive aphasia.   Data Reviewed: Basic Metabolic Panel:  Recent Labs Lab 03/22/16 1900  03/23/16 1621  03/24/16 1435 03/24/16 2008 03/25/16 0300 03/25/16 0805 03/25/16 1939 03/26/16 0248 03/27/16 0218 03/28/16 0344  NA  --   < >  --   --  149*  --  149*  --  149* 147* 142 142  K  --   < >  --   < > 3.1*  --  2.9*  --  3.7 3.5 3.6 3.1*  CL  --   < >  --   --  112*  --  112*  --  109 109 108 108  CO2  --   < >  --   --  24  --  27  --  27 25 20* 21*  GLUCOSE  --   < >  --   --  302*  --  261*  --  183* 173* 137* 166*  BUN  --   < >  --   --  46*  --  41*  --  36* 32* 29* 21*  CREATININE  --   < >  --   --  1.21  --  1.13  --  1.03 1.05 1.01 1.08  CALCIUM  --   < >  --   --  8.9  --  8.8*  --  9.3 9.0 9.2 9.2  MG 2.1  --  2.0  --   --   --   --  2.1  --   --  1.9 2.1  PHOS  --   --  2.3*  --  2.0* 2.1*  --  1.5*  --   --  3.2  --   < > = values in this interval not displayed. Liver Function Tests:  Recent Labs Lab 03/28/16 0344  AST 46*  ALT 39  ALKPHOS 61  BILITOT 0.4  PROT 6.4*  ALBUMIN 2.2*   No results for input(s): LIPASE, AMYLASE in the last 168 hours.  Recent Labs Lab 03/21/16 1815 03/23/16 1333  AMMONIA 43* 27   CBC:  Recent  Labs Lab 03/23/16 0325 03/24/16 0406 03/25/16 0300 03/26/16 0248 03/27/16 0218 03/28/16 0344  WBC 11.9* 12.2* 12.0* 15.2* 16.4* 16.9*  NEUTROABS 9.7* 9.6* 9.4*  --   --  13.5*  HGB 9.6* 9.8* 9.4* 10.7* 11.9* 11.8*  HCT 29.9* 29.9* 30.2* 32.4* 35.0* 35.3*  MCV 92.9 93.7 93.8 93.4 93.1 90.7  PLT 269 269 334 288 354 382   Cardiac Enzymes: No results for input(s): CKTOTAL, CKMB, CKMBINDEX, TROPONINI in the last 168 hours. BNP (last 3 results)  Recent Labs  03/19/16 0010  BNP 385.9*    ProBNP (last 3 results) No results for input(s): PROBNP in the last 8760 hours.  CBG:  Recent Labs Lab 03/27/16 2019 03/28/16 0017 03/28/16 0519 03/28/16 0811 03/28/16 1250  GLUCAP 282* 176* 165* 92 141*    Recent Results (from the past 240 hour(s))  Blood Culture (routine x 2)     Status: None   Collection Time: 03/18/16  4:45 PM  Result Value Ref Range Status   Specimen Description BLOOD LEFT ANTECUBITAL  Final   Special Requests BOTTLES DRAWN AEROBIC AND ANAEROBIC 5ML  Final   Culture NO GROWTH 5 DAYS  Final   Report Status 03/23/2016 FINAL  Final  Blood Culture (routine x 2)     Status: None   Collection Time: 03/18/16  5:15 PM  Result Value Ref Range Status   Specimen Description BLOOD LEFT WRIST  Final   Special Requests BOTTLES DRAWN AEROBIC AND ANAEROBIC 5ML  Final   Culture NO GROWTH 5 DAYS  Final   Report Status 03/23/2016 FINAL  Final  Respiratory virus antigens panel     Status: None   Collection Time: 03/18/16  7:46 PM  Result Value Ref Range Status   Source - RVPAN NASAL SWAB  Corrected   Respiratory Syncytial Virus A Negative Negative Final   Respiratory Syncytial Virus B Negative Negative Final   Influenza A Negative Negative Final   Influenza B Negative Negative Final   Parainfluenza 1 Negative Negative Final   Parainfluenza 2 Negative Negative Final   Parainfluenza 3 Negative Negative  Final   Metapneumovirus Negative Negative Final   Rhinovirus Negative  Negative Final   Adenovirus Negative Negative Final    Comment: (NOTE) Performed At: [facility] [place], [place] [id number] Lindon Romp MD [id number]   Gram stain     Status: None   Collection Time: 03/19/16  9:05 AM  Result Value Ref Range Status   Specimen Description TRACHEAL ASPIRATE  Final   Special Requests NONE  Final   Gram Stain   Final    ABUNDANT WBC PRESENT, PREDOMINANTLY PMN NO ORGANISMS SEEN    Report Status 03/19/2016 FINAL  Final  Culture, respiratory (NON-Expectorated)     Status: None   Collection Time: 03/19/16  9:05 AM  Result Value Ref Range Status   Specimen Description TRACHEAL ASPIRATE  Final   Special Requests Normal  Final   Gram Stain   Final    ABUNDANT WBC PRESENT,BOTH PMN AND MONONUCLEAR NO SQUAMOUS EPITHELIAL CELLS SEEN NO ORGANISMS SEEN Performed at [facility] Performed at [facility]    Culture   Final    NO GROWTH 2 DAYS Performed at [facility]    Report Status 03/21/2016 FINAL  Final  Respiratory virus panel     Status: None   Collection Time: 03/19/16  9:05 AM  Result Value Ref Range Status   Source - RVPAN TRACHEAL ASPIRATE  Corrected   Respiratory Syncytial Virus A Negative Negative Final   Respiratory Syncytial Virus B Negative Negative Final   Influenza A Negative Negative Final   Influenza B Negative Negative Final   Parainfluenza 1 Negative Negative Final   Parainfluenza 2 Negative Negative Final   Parainfluenza 3 Negative Negative Final   Metapneumovirus Negative Negative Final   Rhinovirus Negative Negative Final   Adenovirus Negative Negative Final    Comment: (NOTE) Performed At: [facility] [place], [place] [id number] Lindon Romp MD [id number]   MRSA PCR Screening     Status: Abnormal   Collection Time: 03/19/16  3:13 PM  Result Value Ref Range Status   MRSA by PCR POSITIVE (A) NEGATIVE Final    Comment:        The  GeneXpert MRSA Assay (FDA approved for NASAL specimens only), is one component of a comprehensive MRSA colonization surveillance program. It is not intended to diagnose MRSA infection nor to guide or monitor treatment for MRSA infections. RESULT CALLED TO, READ BACK BY AND VERIFIED WITH: L.WILSON,RN AT 1850 BY L.PITT      Studies:  Recent x-ray studies have been reviewed in detail by the Attending Physician  Scheduled Meds:  Scheduled Meds: . antiseptic oral rinse  7 mL Mouth Rinse BID  . clopidogrel  75 mg Per Tube Daily  . famotidine  20 mg Per Tube Daily  . feeding supplement (ENSURE ENLIVE)  237 mL Oral Q24H  . folic acid  1 mg Oral Daily  . furosemide  40 mg Oral BID  . gabapentin  300 mg Oral TID  . insulin aspart  0-20 Units Subcutaneous 6 times per day  . insulin glargine  8 Units Subcutaneous Daily  . isosorbide-hydrALAZINE  1 tablet Oral Q8H  . levETIRAcetam  1,000 mg Oral Q12H  . losartan  50 mg Oral BID  . metoprolol tartrate  37.5 mg Oral BID  . off the beat book   Does not apply Once  . PARoxetine  40 mg Oral Daily  . predniSONE  5 mg Oral  Q breakfast  . topiramate  25 mg Oral BID    Time spent on care of this patient: 40 mins  Verneita Griffes, MD [facility] [phone]

## 2016-03-28 NOTE — NC FL2 (Signed)
MEDICAID FL2 LEVEL OF CARE SCREENING TOOL     IDENTIFICATION  Patient Name: Julian Ross Birthdate: 02/24/1946 Sex: male Admission Date (Current Location): 03/18/2016  [facility] and [place] Number:  Herbalist and Address:  [facility], [place] [address] [place], [place] [address]      Provider Number: [id number]  Attending Physician Name and Address:  Nita Sells, MD  Relative Name and Phone Number:       Current Level of Care: Hospital Recommended Level of Care: [place] Prior Approval Number:    Date Approved/Denied:   PASRR Number: [id number]  Discharge Plan: SNF    Current Diagnoses: Patient Active Problem List   Diagnosis Date Noted  . Chronic systolic CHF (congestive heart failure) ([place])   . Cardiomyopathy, ischemic   . New onset atrial fibrillation ([place])   . Demand ischemia ([place])   . Acute renal failure superimposed on stage 3 chronic kidney disease ([place])   . Hypokalemia   . Rheumatoid arthritis of multiple sites with negative rheumatoid factor ([place])   . Acute encephalopathy   . Anxiety state   . Acute respiratory failure with hypoxia ([place])   . Acute respiratory failure with hypoxemia ([place])   . AKI (acute kidney injury) ([place])   . Altered mental status   . Atrial fibrillation, new onset ([place])   . Acute on chronic systolic and diastolic heart failure, NYHA class 1 ([place])   . Acute hypoxemic respiratory failure ([place]) 03/19/2016  . Septic shock ([place]) 03/18/2016  . Severe sepsis ([place]) 03/18/2016  . CKD (chronic kidney disease), stage III 03/18/2016  . Edema leg 03/18/2016  . Diabetes mellitus type 2 in obese ([place]) 03/18/2016  . Elevated troponin 03/18/2016  . Anemia 03/18/2016    Orientation RESPIRATION BLADDER Height & Weight     Self, Place  O2 (3L) Continent Weight: 91.808 kg (202 lb 6.4 oz) (scale B) Height:  5\' 11"  (180.3 cm)  BEHAVIORAL SYMPTOMS/MOOD NEUROLOGICAL BOWEL NUTRITION  STATUS   (NONE)  (NONE) Continent Diet (Regular)  AMBULATORY STATUS COMMUNICATION OF NEEDS Skin   Extensive Assist Verbally Normal                       Personal Care Assistance Level of Assistance  Bathing, Feeding, Dressing Bathing Assistance: Limited assistance Feeding assistance: Independent Dressing Assistance: Limited assistance     Functional Limitations Info  Sight, Hearing, Speech Sight Info: Adequate Hearing Info: Adequate Speech Info: Adequate    SPECIAL CARE FACTORS FREQUENCY  PT (By licensed PT), OT (By licensed OT)     PT Frequency: 5/week OT Frequency: 5/week            Contractures Contractures Info: Not present    Additional Factors Info  Allergies, Code Status, Isolation Precautions, Psychotropic Code Status Info: PARTIAL Allergies Info: Ace Inhibitors, Atenolol, Contrast Media, Penicillins Psychotropic Info: PAXIL   Isolation Precautions Info: MRSA by pcr contact precations     Current Medications (03/28/2016):  This is the current hospital active medication list Current Facility-Administered Medications  Medication Dose Route Frequency Provider Last Rate Last Dose  . 0.9 %  sodium chloride infusion  250 mL Intravenous PRN Dannielle Burn, MD 10 mL/hr at 03/27/16 0700 250 mL at 03/27/16 0700  . acetaminophen (TYLENOL) tablet 650 mg  650 mg Oral Q6H PRN Juanito Doom, MD   650 mg at 03/19/16 1355  . antiseptic oral rinse (CPC / CETYLPYRIDINIUM CHLORIDE  0.05%) solution 7 mL  7 mL Mouth Rinse BID Juanito Doom, MD   7 mL at 03/28/16 1000  . clopidogrel (PLAVIX) tablet 75 mg  75 mg Per Tube Daily Rush Farmer, MD   75 mg at 03/28/16 1107  . dexmedetomidine (PRECEDEX) 200 MCG/50ML (4 mcg/mL) infusion  0.4-1.2 mcg/kg/hr (Ideal) Intravenous Titrated Rush Farmer, MD   Stopped at 03/26/16 1000  . dextrose 5 % solution   Intravenous Continuous Raylene Miyamoto, MD 30 mL/hr at 03/27/16 2240    . famotidine (PEPCID) 40 MG/5ML suspension  20 mg  20 mg Per Tube Daily Dannielle Burn, MD   20 mg at 03/28/16 1109  . feeding supplement (ENSURE ENLIVE) (ENSURE ENLIVE) liquid 237 mL  237 mL Oral Q24H Ardeen Garland, RD   237 mL at 03/27/16 1311  . fentaNYL (SUBLIMAZE) injection 12.5-25 mcg  12.5-25 mcg Intravenous Q2H PRN Juanito Doom, MD   25 mcg at 03/26/16 0612  . folic acid (FOLVITE) tablet 1 mg  1 mg Oral Daily Juanito Doom, MD   1 mg at 03/28/16 1111  . furosemide (LASIX) tablet 40 mg  40 mg Oral BID Arbutus Leas, NP   40 mg at 03/28/16 1307  . gabapentin (NEURONTIN) capsule 300 mg  300 mg Oral TID Juanito Doom, MD   300 mg at 03/28/16 1106  . guaiFENesin tablet 200 mg  200 mg Oral Q4H PRN Rush Farmer, MD   200 mg at 03/27/16 1011  . heparin ADULT infusion 100 units/mL (25000 units/250 mL)  2,000 Units/hr Intravenous Continuous Laren Everts, RPH 20 mL/hr at 03/28/16 0543 2,000 Units/hr at 03/28/16 0543  . hydrALAZINE (APRESOLINE) injection 10-40 mg  10-40 mg Intravenous Q4H PRN Juanito Doom, MD   20 mg at 03/27/16 0404  . Influenza vac split quadrivalent PF (FLUARIX) injection 0.5 mL  0.5 mL Intramuscular Prior to discharge Rush Farmer, MD      . insulin aspart (novoLOG) injection 0-20 Units  0-20 Units Subcutaneous 6 times per day Juanito Doom, MD   3 Units at 03/28/16 1300  . insulin glargine (LANTUS) injection 8 Units  8 Units Subcutaneous Daily Allie Bossier, MD   8 Units at 03/28/16 1111  . ipratropium-albuterol (DUONEB) 0.5-2.5 (3) MG/3ML nebulizer solution 3 mL  3 mL Nebulization Q2H PRN Norval Morton, MD   3 mL at 03/19/16 0453  . isosorbide-hydrALAZINE (BIDIL) 20-37.5 MG per tablet 1 tablet  1 tablet Oral Q8H Troy Sine, MD      . levETIRAcetam (KEPPRA) tablet 1,000 mg  1,000 mg Oral Q12H Nita Sells, MD      . LORazepam (ATIVAN) injection 2 mg  2 mg Intravenous Q4H PRN Juanito Doom, MD   2 mg at 03/27/16 0004  . losartan (COZAAR) tablet 50 mg  50 mg Oral BID Troy Sine, MD   50 mg at 03/28/16 1110  . metoprolol tartrate (LOPRESSOR) tablet 37.5 mg  37.5 mg Oral BID Bhavinkumar Bhagat, PA   37.5 mg at 03/28/16 1106  . off the beat book   Does not apply Once Vern Claude, Therapist, sports      . ondansetron (ZOFRAN) injection 4 mg  4 mg Intravenous Q8H PRN Mauri Brooklyn, MD      . PARoxetine (PAXIL) tablet 40 mg  40 mg Oral Daily Juanito Doom, MD   40 mg at 03/28/16 1108  . pneumococcal 23  valent vaccine (PNU-IMMUNE) injection 0.5 mL  0.5 mL Intramuscular Prior to discharge Rush Farmer, MD      . predniSONE (DELTASONE) tablet 5 mg  5 mg Oral Q breakfast Raylene Miyamoto, MD   5 mg at 03/28/16 1109  . topiramate (TOPAMAX) tablet 25 mg  25 mg Oral BID Juanito Doom, MD   25 mg at 03/28/16 1110     Discharge Medications: Please see discharge summary for a list of discharge medications.  Relevant Imaging Results:  Relevant Lab Results:   Additional Information SSN: [id number]  [name], LCSW

## 2016-03-29 DIAGNOSIS — I248 Other forms of acute ischemic heart disease: Secondary | ICD-10-CM

## 2016-03-29 LAB — CBC
HCT: 34.3 % — ABNORMAL LOW (ref 39.0–52.0)
Hemoglobin: 11.4 g/dL — ABNORMAL LOW (ref 13.0–17.0)
MCH: 29.8 pg (ref 26.0–34.0)
MCHC: 33.2 g/dL (ref 30.0–36.0)
MCV: 89.6 fL (ref 78.0–100.0)
Platelets: 397 10*3/uL (ref 150–400)
RBC: 3.83 MIL/uL — AB (ref 4.22–5.81)
RDW: 14.2 % (ref 11.5–15.5)
WBC: 19.4 10*3/uL — AB (ref 4.0–10.5)

## 2016-03-29 LAB — GLUCOSE, CAPILLARY
GLUCOSE-CAPILLARY: 66 mg/dL (ref 65–99)
GLUCOSE-CAPILLARY: 78 mg/dL (ref 65–99)
GLUCOSE-CAPILLARY: 145 mg/dL — AB (ref 65–99)
GLUCOSE-CAPILLARY: 198 mg/dL — AB (ref 65–99)
Glucose-Capillary: 168 mg/dL — ABNORMAL HIGH (ref 65–99)
Glucose-Capillary: 254 mg/dL — ABNORMAL HIGH (ref 65–99)
Glucose-Capillary: 200 mg/dL — ABNORMAL HIGH (ref 65–99)

## 2016-03-29 LAB — COMPREHENSIVE METABOLIC PANEL
ALK PHOS: 61 U/L (ref 38–126)
ALT: 49 U/L (ref 17–63)
ANION GAP: 12 (ref 5–15)
AST: 53 U/L — ABNORMAL HIGH (ref 15–41)
Albumin: 2.2 g/dL — ABNORMAL LOW (ref 3.5–5.0)
BILIRUBIN TOTAL: 0.6 mg/dL (ref 0.3–1.2)
BUN: 19 mg/dL (ref 6–20)
CALCIUM: 9.1 mg/dL (ref 8.9–10.3)
CO2: 22 mmol/L (ref 22–32)
CREATININE: 1.17 mg/dL (ref 0.61–1.24)
Chloride: 107 mmol/L (ref 101–111)
GFR calc non Af Amer: >60 mL/min (ref >60)
Glucose, Bld: 66 mg/dL (ref 65–99)
Potassium: 2.7 mmol/L — CL (ref 3.5–5.1)
Sodium: 141 mmol/L (ref 135–145)
TOTAL PROTEIN: 6.4 g/dL — AB (ref 6.5–8.1)

## 2016-03-29 LAB — HEPARIN LEVEL (UNFRACTIONATED): Heparin Unfractionated: 0.45 IU/mL (ref 0.30–0.70)

## 2016-03-29 LAB — MAGNESIUM
MAGNESIUM: 1.9 mg/dL (ref 1.7–2.4)
Magnesium: 2.1 mg/dL (ref 1.7–2.4)

## 2016-03-29 MED ORDER — MAGNESIUM OXIDE 400 (241.3 MG) MG PO TABS
400.0000 mg | ORAL_TABLET | Freq: Two times a day (BID) | ORAL | Status: DC
  Administered 2016-03-29 – 2016-04-02 (×9): 400 mg via ORAL
  Filled 2016-03-29 (×10): qty 1

## 2016-03-29 MED ORDER — INSULIN GLARGINE 100 UNIT/ML ~~LOC~~ SOLN
5.0000 [IU] | Freq: Every day | SUBCUTANEOUS | Status: DC
  Administered 2016-03-30 – 2016-04-02 (×4): 5 [IU] via SUBCUTANEOUS
  Filled 2016-03-29 (×4): qty 0.05

## 2016-03-29 MED ORDER — LORAZEPAM 0.5 MG PO TABS
0.5000 mg | ORAL_TABLET | Freq: Four times a day (QID) | ORAL | Status: DC | PRN
  Administered 2016-03-29: 0.5 mg via ORAL
  Filled 2016-03-29: qty 1

## 2016-03-29 MED ORDER — POTASSIUM CHLORIDE CRYS ER 20 MEQ PO TBCR
40.0000 meq | EXTENDED_RELEASE_TABLET | Freq: Three times a day (TID) | ORAL | Status: DC
  Administered 2016-03-29 – 2016-04-02 (×13): 40 meq via ORAL
  Filled 2016-03-29 (×13): qty 2

## 2016-03-29 MED ORDER — POTASSIUM CHLORIDE CRYS ER 20 MEQ PO TBCR
40.0000 meq | EXTENDED_RELEASE_TABLET | Freq: Two times a day (BID) | ORAL | Status: DC
  Administered 2016-03-29: 40 meq via ORAL
  Filled 2016-03-29: qty 2

## 2016-03-29 MED ORDER — NAPHAZOLINE-GLYCERIN 0.012-0.2 % OP SOLN
2.0000 [drp] | Freq: Four times a day (QID) | OPHTHALMIC | Status: DC | PRN
  Administered 2016-03-29: 2 [drp] via OPHTHALMIC
  Filled 2016-03-29: qty 15

## 2016-03-29 MED ORDER — APIXABAN 5 MG PO TABS
5.0000 mg | ORAL_TABLET | Freq: Two times a day (BID) | ORAL | Status: DC
  Administered 2016-03-29 – 2016-04-02 (×8): 5 mg via ORAL
  Filled 2016-03-29 (×8): qty 1

## 2016-03-29 MED ORDER — NAPHAZOLINE-PHENIRAMINE 0.025-0.3 % OP SOLN
2.0000 [drp] | Freq: Four times a day (QID) | OPHTHALMIC | Status: DC | PRN
  Filled 2016-03-29: qty 5

## 2016-03-29 MED ORDER — FAMOTIDINE 20 MG PO TABS
20.0000 mg | ORAL_TABLET | Freq: Every day | ORAL | Status: DC
  Administered 2016-03-30 – 2016-04-02 (×4): 20 mg via ORAL
  Filled 2016-03-29 (×5): qty 1

## 2016-03-29 NOTE — Progress Notes (Signed)
ANTICOAGULATION CONSULT NOTE - Follow Up Consult  Pharmacy Consult for heparin  Indication: atrial fibrillation  Allergies  Allergen Reactions  . Ace Inhibitors Cough  . Atenolol Other (See Comments)    Unknown reaction  . Contrast Media [Iodinated Diagnostic Agents] Rash  . Penicillins Hives and Rash    Has patient had a PCN reaction causing immediate rash, facial/tongue/throat swelling, SOB or lightheadedness with hypotension: Yes Has patient had a PCN reaction causing severe rash involving mucus membranes or skin necrosis: No Has patient had a PCN reaction that required hospitalization pt was in the hospital at time of last reaction - heart attack Has patient had a PCN reaction occurring within the last 10 years: No If all of the above answers are "NO", then may proceed with Cephalosporin use.    Patient Measurements: Height: 5\' 11"  (180.3 cm) Weight: 197 lb 3.2 oz (89.449 kg) (pt unstable to do standing weight) IBW/kg (Calculated) : 75.3  Vital Signs: Temp: 97.9 F (36.6 C) (03/30 0943) Temp Source: Oral (03/30 0943) BP: 119/66 mmHg (03/30 0943) Pulse Rate: 102 (03/30 0943)  Labs:  Recent Labs  03/27/16 0218 03/28/16 0344 03/29/16 0331  HGB 11.9* 11.8* 11.4*  HCT 35.0* 35.3* 34.3*  PLT 354 382 397  HEPARINUNFRC 0.52 0.64 0.45  CREATININE 1.01 1.08 1.17    Estimated Creatinine Clearance: 63.5 mL/min (by C-G formula based on Cr of 1.17).  Assessment: 70 yo M continues on heparin for afib. Heparin level remains therapeutic at 0.64. CBC is stable and no bleeding noted.   Goal of Therapy:  Heparin level 0.3-0.7 units/ml Monitor platelets by anticoagulation protocol: Yes   Plan:  - Continue heparin gtt 2000 units/hr - Daily heparin level and CBC - F/u plans for oral anticoagulation  Thank you Anette Guarneri, PharmD [phone]  03/29/2016 10:04 AM

## 2016-03-29 NOTE — Progress Notes (Signed)
[place] TEAM 1 - Stepdown/ICU TEAM Progress Note  Julian Ross [id number] DOB: 11-21-1946 DOA: 03/18/2016 PCP: No PCP Per Patient  Admit HPI / Brief Narrative: 70 year old male PMHx Substance Abuse (polypharmacy), partial blindness HTN, Chronic Systolic CHF HLD, DM Type 2, MI, CAD native artery  s/p PCI;   Who presents with progressively worsening shortness of breath over the last 2 days. Patient actually reports having difficulty with shortness of breath over the last 2-3 months with multiple falls which she related to generalized weakness. However in the last 2 days he noted that his voice was deepening and he was congested. Denies coughing during the day but at night while utilizing his CPAP mask he would seem to cough more. Unable to produce any sputum. Also noted complaints of chest tightness like someone was squeezing his chest. He endorses nasal congestion, decreased activity, and malaise. Patient notes previously having pneumonia 2 years ago in which he ended up in the ICU, but this time was not similar in that he was able to cough up mucus.  Upon admission patient is evaluated initial vital signs included temperature 102.75F, heart rate 151, blood pressure 69/49, O2 sats 89%. Chest x-ray and seen have bilateral infiltrates suggestive of pneumonia. Lab work reveals WBC 16.7, lactic acid 4.52, creatinine 2.1 BUN 21. Patient noted weight 2 weeks ago was 220 pounds however tonight was weighed at 240 pounds.  HPI/Subjective:   thinks that he is in [place] Can orient to wife and knows he has 6 kids No cp n/v/sob noted by RN Agitated per RN this am and needed to wear mittens   Assessment/Plan: Acute Respiratory Failure with Hypoxia /HCAP/Severe Sepsis -Complete 10 days of aztreonam per PCCM -Flutter valve -Out of bed to chair/ambulate -PT; recommends SNF when more orietned  OSA - on CPAP,  -CPAP per respiratory  Chronic systolic CHF (LVEF [id number]  cardiomyopathy -Lasix 40 mg  BID -Metoprolol 37.5 mg bid -Cozaar 50 mg bid  -BIDIL 20-30 7.5 mg bid -Strict in and out since admission -10.6 L so far since admission -Daily weight has dropped from 208-->202-->197 pounds  New onset A fib - improved control -Currently in A. fib but rate controlled. -See chronic systolic CHF  Elevated troponin/Demand ischemia  -Secondary to A. fib with RVR new-onset  Hx MI, CAD s/p PCI - reportedly has 16 stents per wife -Currently not a factor -Continue plavix 75 daily  Acute on CKD STAGE III -Continue monitor closely  Hypokalemia -Potassium goal> 4 -K Dur 20 mEq  Hypomagnesemia  -Magnesium goal> 2 -Magnesium 03/29/16 = 1.9. Replace orally Mag-Ox 400 daily-->bid 3/30 -Magnesium IV 1 gm  Anemia of chronic disease? -Anemia panel  showed iron level  39, saturation ratio 15 - anemia is not microcytic however -Transfuse for hemoglobin<8  RA - on plaquenil / prednisone 5mg  QD -Hold home plaquenil -Restart prednisone 5 mg daily (home dose)  -needs to follow-up with rheumatologist as an outpatient  DM Type II uncontrolled/Diabetic Neuropathy  -3/19 Hemoglobin A1c = 9.2    -Lantus 8 mg - CBG range =78-254  Recurrent tremors/Essential Tremor -> myoclonus per neurology, resolved -Essential tremor not evident on exam  Anxiety  , chronic pain, polypharmacy Acute encephalopathy  - uses xanax at home, family reports polypharmacy -Patient now 10 days out, should be through withdrawal  Period. -Avoid benzodiazepine  -taper Gabapentin 300 tid-->100 bid, Fentanyl from 12.5-25-->12.5, lorazepam  2 mg every 4-1 mg every 4  we'll continue to taper and delineate his medications as  appropriate-I had a 10 minute discussion with his wife 3/30 Patient will probably need 48-72 hours more in my estimation for sustained improvement in mental state Son tells me that 10 years ago a similar episode occurred when he had a cardiac catheterization and he took  one week  to come back to normal mentation    Partial Blindness    Code Status: Partial Family Communication:  long discussion with son 03/28/16 Disposition Plan: SNF   >35 minutes  Consultants: [facility] Dr.Daniel Lily Kocher [facility]   Procedure/Significant Events:  CT Head 3/19 >> no acute process, chronic atrophy & small vessl disease 3/20 echocardiogram;- Left ventricle: mild LVH. -LVEF  30% to 35%. Global hypokinesis with severe  inferior hypokinesis to akinesis. - Pulmonary arteries: PA peak pressure: 35 mm Hg (S). 3/20 RLE Doppler; negative DVT.-Complicated mixed cystic area noted in the right popliteal fossa, Atypical baker's cyst? EEG 3/21 > slowing but no seizure EEG 3/22> no seizure 3/24 CT head > chronic microvascular change, nothing acute 3/24 CXR > worsenign bilateral airspace disease   Culture Blood cx 3/19 >> RVP 3/19 >> neg Flu 3/19 >> neg   Antibiotics: Vanc 3/19 >> 3/22 Aztreonam 3/19 >>  Levaquin 3/19 >> 3/22  DVT prophylaxis: Lovenox   Devices    LINES / TUBES:  ETT 3/20 > 3/23>>>3/26 Foley 3/20    Continuous Infusions: . heparin 2,000 Units/hr (03/29/16 0848)    Objective: VITAL SIGNS: Temp: 98.6 F (37 C) (03/30 1202) Temp Source: Oral (03/30 1202) BP: 101/63 mmHg (03/30 1202) Pulse Rate: 67 (03/30 1202) SPO2; FIO2:   Intake/Output Summary (Last 24 hours) at 03/29/16 1216 Last data filed at 03/29/16 0900  Gross per 24 hour  Intake   1650 ml  Output   2450 ml  Net   -800 ml     Exam: General:A/O x 3 (does not know where) clearly some confusion remains (baseline? Patient in mittens),  Eyes: Negative headache, negative scleral hemorrhage ENT: Negative Runny nose, negative gingival bleeding, Neck:  Negative scars, masses, torticollis, lymphadenopathy, JVD Lungs: Clear to auscultation bilaterally without wheezes or crackles Cardiovascular: Irregular irregular rhythm and rate, negative murmur gallop  or rub normal S1 and S2 Abdomen:negative abdominal pain, nondistended, positive soft, bowel sounds, no rebound, no ascites, no appreciable mass Moving all 4 limbs equally    Data Reviewed: Basic Metabolic Panel:  Recent Labs Lab 03/23/16 1621  03/24/16 1435 03/24/16 2008  03/25/16 0805 03/25/16 1939 03/26/16 0248 03/27/16 0218 03/28/16 0344 03/29/16 0331 03/29/16 0917  NA  --   --  149*  --   < >  --  149* 147* 142 142 141  --   K  --   < > 3.1*  --   < >  --  3.7 3.5 3.6 3.1* 2.7*  --   CL  --   --  112*  --   < >  --  109 109 108 108 107  --   CO2  --   --  24  --   < >  --  27 25 20* 21* 22  --   GLUCOSE  --   --  302*  --   < >  --  183* 173* 137* 166* 66  --   BUN  --   --  46*  --   < >  --  36* 32* 29* 21* 19  --   CREATININE  --   --  1.21  --   < >  --  1.03 1.05 1.01 1.08 1.17  --   CALCIUM  --   --  8.9  --   < >  --  9.3 9.0 9.2 9.2 9.1  --   MG 2.0  --   --   --   --  2.1  --   --  1.9 2.1 2.1 1.9  PHOS 2.3*  --  2.0* 2.1*  --  1.5*  --   --  3.2  --   --   --   < > = values in this interval not displayed. Liver Function Tests:  Recent Labs Lab 03/28/16 0344 03/29/16 0331  AST 46* 53*  ALT 39 49  ALKPHOS 61 61  BILITOT 0.4 0.6  PROT 6.4* 6.4*  ALBUMIN 2.2* 2.2*   No results for input(s): LIPASE, AMYLASE in the last 168 hours.  Recent Labs Lab 03/23/16 1333  AMMONIA 27   CBC:  Recent Labs Lab 03/23/16 0325 03/24/16 0406 03/25/16 0300 03/26/16 0248 03/27/16 0218 03/28/16 0344 03/29/16 0331  WBC 11.9* 12.2* 12.0* 15.2* 16.4* 16.9* 19.4*  NEUTROABS 9.7* 9.6* 9.4*  --   --  13.5*  --   HGB 9.6* 9.8* 9.4* 10.7* 11.9* 11.8* 11.4*  HCT 29.9* 29.9* 30.2* 32.4* 35.0* 35.3* 34.3*  MCV 92.9 93.7 93.8 93.4 93.1 90.7 89.6  PLT 269 269 334 288 354 382 397   Cardiac Enzymes: No results for input(s): CKTOTAL, CKMB, CKMBINDEX, TROPONINI in the last 168 hours. BNP (last 3 results)  Recent Labs  03/19/16 0010  BNP 385.9*    ProBNP (last 3  results) No results for input(s): PROBNP in the last 8760 hours.  CBG:  Recent Labs Lab 03/29/16 0032 03/29/16 0327 03/29/16 0411 03/29/16 0857 03/29/16 1157  GLUCAP 168* 66 78 145* 254*    Recent Results (from the past 240 hour(s))  MRSA PCR Screening     Status: Abnormal   Collection Time: 03/19/16  3:13 PM  Result Value Ref Range Status   MRSA by PCR POSITIVE (A) NEGATIVE Final    Comment:        The GeneXpert MRSA Assay (FDA approved for NASAL specimens only), is one component of a comprehensive MRSA colonization surveillance program. It is not intended to diagnose MRSA infection nor to guide or monitor treatment for MRSA infections. RESULT CALLED TO, READ BACK BY AND VERIFIED WITH: L.WILSON,RN AT 1850 BY L.PITT      Studies:  Recent x-ray studies have been reviewed in detail by the Attending Physician  Scheduled Meds:  Scheduled Meds: . antiseptic oral rinse  7 mL Mouth Rinse BID  . clopidogrel  75 mg Per Tube Daily  . [START ON 03/30/2016] famotidine  20 mg Oral Daily  . feeding supplement (ENSURE ENLIVE)  237 mL Oral Q24H  . folic acid  1 mg Oral Daily  . furosemide  40 mg Oral BID  . gabapentin  100 mg Oral BID  . insulin aspart  0-20 Units Subcutaneous 6 times per day  . insulin glargine  8 Units Subcutaneous Daily  . isosorbide-hydrALAZINE  1 tablet Oral Q8H  . levETIRAcetam  1,000 mg Oral Q12H  . losartan  50 mg Oral BID  . metoprolol tartrate  37.5 mg Oral BID  . PARoxetine  40 mg Oral Daily  . potassium chloride  40 mEq Oral TID  . predniSONE  5 mg Oral Q breakfast  . topiramate  25 mg Oral  BID    Time spent on care of this patient: 35 mins  Verneita Griffes, MD [facility] [phone]

## 2016-03-29 NOTE — Clinical Social Work Note (Signed)
Patient and wife have chosen [facility]. The facility states that they will be able to accept the patient at time of discharge and can accommodate a weekend discharge if the patient is ready this weekend.   Liz Beach MSW, [place], [place], [id number]

## 2016-03-29 NOTE — Progress Notes (Signed)
Patient Name: Julian Ross Date of Encounter: 03/29/2016  Principal Problem:   Septic shock ([place]) Active Problems:   CKD (chronic kidney disease), stage III   Edema leg   Diabetes mellitus type 2 in obese (HCC)   Elevated troponin   Anemia   Acute hypoxemic respiratory failure (HCC)   Altered mental status   Atrial fibrillation, new onset (HCC)   Acute on chronic systolic and diastolic heart failure, NYHA class 1 (HCC)   Acute respiratory failure with hypoxemia (HCC)   AKI (acute kidney injury) (HCC)   Acute respiratory failure with hypoxia (HCC)   Chronic systolic CHF (congestive heart failure) (HCC)   Cardiomyopathy, ischemic   New onset atrial fibrillation ([place])   Demand ischemia ([place])   Acute renal failure superimposed on stage 3 chronic kidney disease (HCC)   Hypokalemia   Rheumatoid arthritis of multiple sites with negative rheumatoid factor (HCC)   Acute encephalopathy   Anxiety state   Patient Profile: 39M with CAD s/p MI and PCI, chronic systolic heart failure, hypertension, hyperlipidemia, and diabetes mellitus who is here with hypoxic respiratory failure and pneumonia, new-onset atrial fibrillation.   SUBJECTIVE: He is confused. Not sure where he is, oriented to self.  Has been agitated.   OBJECTIVE Filed Vitals:   03/29/16 0540 03/29/16 0926 03/29/16 0943 03/29/16 1202  BP: 134/68  119/66 101/63  Pulse: 100  102 67  Temp: 98.3 F (36.8 C)  97.9 F (36.6 C) 98.6 F (37 C)  TempSrc: Oral  Oral Oral  Resp: 20  18 18   Height:      Weight:  197 lb 3.2 oz (89.449 kg)    SpO2: 94%  97% 98%    Intake/Output Summary (Last 24 hours) at 03/29/16 1315 Last data filed at 03/29/16 0900  Gross per 24 hour  Intake   1530 ml  Output   2450 ml  Net   -920 ml   Filed Weights   03/27/16 1450 03/28/16 0644 03/29/16 0926  Weight: 202 lb 3.2 oz (91.717 kg) 202 lb 6.4 oz (91.808 kg) 197 lb 3.2 oz (89.449 kg)    PHYSICAL EXAM General: Well developed, well  nourished, male in no acute distress. Head: Normocephalic, atraumatic.  Neck: Supple without bruits, JVD. Lungs:  Resp regular and unlabored, CTA. Heart: RRR, S1, S2, no S3, S4, or murmur; no rub. Abdomen: Soft, non-tender, non-distended, BS + x 4.  Extremities: No clubbing, cyanosis, edema.  Neuro: Alert and oriented X 3. Moves all extremities spontaneously. Psych: Normal affect.  LABS: CBC: Recent Labs  03/28/16 0344 03/29/16 0331  WBC 16.9* 19.4*  NEUTROABS 13.5*  --   HGB 11.8* 11.4*  HCT 35.3* 34.3*  MCV 90.7 89.6  PLT 382 [id number]   Basic Metabolic Panel: Recent Labs  03/27/16 0218 03/28/16 0344 03/29/16 0331 03/29/16 0917  NA 142 142 141  --   K 3.6 3.1* 2.7*  --   CL 108 108 107  --   CO2 20* 21* 22  --   GLUCOSE 137* 166* 66  --   BUN 29* 21* 19  --   CREATININE 1.01 1.08 1.17  --   CALCIUM 9.2 9.2 9.1  --   MG 1.9 2.1 2.1 1.9  PHOS 3.2  --   --   --    Liver Function Tests: Recent Labs  03/28/16 0344 03/29/16 0331  AST 46* 53*  ALT 39 49  ALKPHOS 61 61  BILITOT 0.4 0.6  PROT  6.4* 6.4*  ALBUMIN 2.2* 2.2*   BNP:  B NATRIURETIC PEPTIDE  Date/Time Value Ref Range Status  03/19/2016 12:10 AM 385.9* 0.0 - 100.0 pg/mL Final   Anemia Panel: Recent Labs  03/27/16 2000  VITAMINB12 876  FOLATE 18.7  FERRITIN 421*  TIBC 256  IRON 39*  RETICCTPCT 2.1     Current facility-administered medications:  .  0.9 %  sodium chloride infusion, 250 mL, Intravenous, PRN, Dannielle Burn, MD, Last Rate: 10 mL/hr at 03/27/16 0700, 250 mL at 03/27/16 0700 .  acetaminophen (TYLENOL) tablet 650 mg, 650 mg, Oral, Q6H PRN, Juanito Doom, MD, 650 mg at 03/19/16 1355 .  antiseptic oral rinse (CPC / CETYLPYRIDINIUM CHLORIDE 0.05%) solution 7 mL, 7 mL, Mouth Rinse, BID, Juanito Doom, MD, 7 mL at 03/29/16 0945 .  clopidogrel (PLAVIX) tablet 75 mg, 75 mg, Per Tube, Daily, Rush Farmer, MD, 75 mg at 03/29/16 0944 .  [START ON 03/30/2016] famotidine (PEPCID)  tablet 20 mg, 20 mg, Oral, Daily, Nita Sells, MD .  feeding supplement (ENSURE ENLIVE) (ENSURE ENLIVE) liquid 237 mL, 237 mL, Oral, Q24H, Ardeen Garland, RD, 237 mL at 03/27/16 1311 .  fentaNYL (SUBLIMAZE) injection 12.5 mcg, 12.5 mcg, Intravenous, Q3H PRN, Nita Sells, MD .  folic acid (FOLVITE) tablet 1 mg, 1 mg, Oral, Daily, Juanito Doom, MD, 1 mg at 03/29/16 1029 .  furosemide (LASIX) tablet 40 mg, 40 mg, Oral, BID, Arbutus Leas, NP, 40 mg at 03/29/16 0849 .  gabapentin (NEURONTIN) capsule 100 mg, 100 mg, Oral, BID, Nita Sells, MD, 100 mg at 03/29/16 0944 .  guaiFENesin tablet 200 mg, 200 mg, Oral, Q4H PRN, Rush Farmer, MD, 200 mg at 03/27/16 1011 .  heparin ADULT infusion 100 units/mL (25000 units/250 mL), 2,000 Units/hr, Intravenous, Continuous, Veronda Rolly Salter, RPH, Last Rate: 20 mL/hr at 03/29/16 0848, 2,000 Units/hr at 03/29/16 0848 .  hydrALAZINE (APRESOLINE) injection 10-40 mg, 10-40 mg, Intravenous, Q4H PRN, Juanito Doom, MD, 20 mg at 03/27/16 0404 .  Influenza vac split quadrivalent PF (FLUARIX) injection 0.5 mL, 0.5 mL, Intramuscular, Prior to discharge, Rush Farmer, MD .  insulin aspart (novoLOG) injection 0-20 Units, 0-20 Units, Subcutaneous, 6 times per day, Juanito Doom, MD, 11 Units at 03/29/16 1239 .  [START ON 03/30/2016] insulin glargine (LANTUS) injection 5 Units, 5 Units, Subcutaneous, Daily, Nita Sells, MD .  ipratropium-albuterol (DUONEB) 0.5-2.5 (3) MG/3ML nebulizer solution 3 mL, 3 mL, Nebulization, Q2H PRN, Norval Morton, MD, 3 mL at 03/19/16 0453 .  isosorbide-hydrALAZINE (BIDIL) 20-37.5 MG per tablet 1 tablet, 1 tablet, Oral, Q8H, Troy Sine, MD, 1 tablet at 03/29/16 1028 .  levETIRAcetam (KEPPRA) tablet 1,000 mg, 1,000 mg, Oral, Q12H, Nita Sells, MD, 1,000 mg at 03/29/16 0605 .  LORazepam (ATIVAN) injection 1 mg, 1 mg, Intravenous, Q4H PRN, Nita Sells, MD .  losartan (COZAAR) tablet 50 mg,  50 mg, Oral, BID, Troy Sine, MD, 50 mg at 03/29/16 0849 .  magnesium oxide (MAG-OX) tablet 400 mg, 400 mg, Oral, BID, Nita Sells, MD .  metoprolol tartrate (LOPRESSOR) tablet 37.5 mg, 37.5 mg, Oral, BID, Bhavinkumar Bhagat, PA, 37.5 mg at 03/29/16 0944 .  naphazoline-glycerin (CLEAR EYES) ophth solution 2 drop, 2 drop, Both Eyes, QID PRN, Nita Sells, MD .  ondansetron (ZOFRAN) injection 4 mg, 4 mg, Intravenous, Q8H PRN, Mauri Brooklyn, MD .  PARoxetine (PAXIL) tablet 40 mg, 40 mg, Oral, Daily, Juanito Doom, MD, 40 mg  at 03/29/16 1029 .  pneumococcal 23 valent vaccine (PNU-IMMUNE) injection 0.5 mL, 0.5 mL, Intramuscular, Prior to discharge, Rush Farmer, MD .  potassium chloride SA (K-DUR,KLOR-CON) CR tablet 40 mEq, 40 mEq, Oral, TID, Nita Sells, MD, 40 mEq at 03/29/16 0944 .  predniSONE (DELTASONE) tablet 5 mg, 5 mg, Oral, Q breakfast, Raylene Miyamoto, MD, 5 mg at 03/29/16 0848 .  topiramate (TOPAMAX) tablet 25 mg, 25 mg, Oral, BID, Juanito Doom, MD, 25 mg at 03/29/16 1029 . heparin 2,000 Units/hr (03/29/16 0848)    TELE: Afib      Radiology/Studies: Dg Chest Port 1 View  03/28/2016  CLINICAL DATA:  Pneumonia, hypertension, diabetes mellitus EXAM: PORTABLE CHEST 1 VIEW COMPARISON:  Portable exam 1823 hours compared to 03/25/2016 FINDINGS: Slight rotation to the LEFT. Normal heart size, mediastinal contours and pulmonary vascularity for technique. Coronary arterial stent noted. Infiltrates identified in the periphery of the mid to lower LEFT lung question pneumonia. Minimal RIGHT basilar atelectasis. No definite pleural effusion, pneumothorax or acute osseous findings. IMPRESSION: Persistent infiltrate LEFT lung. Electronically Signed   By: Lavonia Dana M.D.   On: 03/28/2016 18:45     Current Medications:  . antiseptic oral rinse  7 mL Mouth Rinse BID  . clopidogrel  75 mg Per Tube Daily  . [START ON 03/30/2016] famotidine  20 mg Oral Daily  .  feeding supplement (ENSURE ENLIVE)  237 mL Oral Q24H  . folic acid  1 mg Oral Daily  . furosemide  40 mg Oral BID  . gabapentin  100 mg Oral BID  . insulin aspart  0-20 Units Subcutaneous 6 times per day  . [START ON 03/30/2016] insulin glargine  5 Units Subcutaneous Daily  . isosorbide-hydrALAZINE  1 tablet Oral Q8H  . levETIRAcetam  1,000 mg Oral Q12H  . losartan  50 mg Oral BID  . magnesium oxide  400 mg Oral BID  . metoprolol tartrate  37.5 mg Oral BID  . PARoxetine  40 mg Oral Daily  . potassium chloride  40 mEq Oral TID  . predniSONE  5 mg Oral Q breakfast  . topiramate  25 mg Oral BID   . heparin 2,000 Units/hr (03/29/16 0848)    ASSESSMENT AND PLAN: Principal Problem:   Septic shock (HCC) Active Problems:   CKD (chronic kidney disease), stage III   Edema leg   Diabetes mellitus type 2 in obese (HCC)   Elevated troponin   Anemia   Acute hypoxemic respiratory failure (HCC)   Altered mental status   Atrial fibrillation, new onset (HCC)   Acute on chronic systolic and diastolic heart failure, NYHA class 1 (HCC)   Acute respiratory failure with hypoxemia (HCC)   AKI (acute kidney injury) ([place])   Acute respiratory failure with hypoxia (HCC)   Chronic systolic CHF (congestive heart failure) (HCC)   Cardiomyopathy, ischemic   New onset atrial fibrillation ([place])   Demand ischemia ([place])   Acute renal failure superimposed on stage 3 chronic kidney disease (HCC)   Hypokalemia   Rheumatoid arthritis of multiple sites with negative rheumatoid factor (HCC)   Acute encephalopathy   Anxiety state   1. New onset atrial fibrillation: On metoprolol, dose increased to 37.5mg  BID. He cannot tolerate IV diltiazem as it caused significant bradycardia. He is rate controlled, elevated with activity. CHADSVASC score at least 4. He will require long term anticoagulation, MD to advise when to start. He will need DCCV after 4 weeks of anticoagulation. He can take  oral meds.   2. Chronic  systolic and diastolic heart failure: Echo this admission reveals EF of 30-35% with focal wall motion abnormalities. Weight down to 197 lbs. He was 240 upon admission. He is still on IV furosemide, can switch to po as he appears euvolemic on exam. Cr is stable. Continue Bidil, BB and ARB.   3. CAD: Mr. Hearing has an extensive history of CAD with up to 16 prior stents. Followed in [place] by cardiologist. Troponin was mildly elevated at 0.13 upon admission. EKG has not been concerning for active ischemia. He has had several episodes of ventricular ectopy. This seems to have improved on metoprolol. Continue home Plavix and heparin. He is also not on a statin. LDL 67. Patient has previously been intolerant of statins.    Signed, Arbutus Leas , NP 1:15 PM 03/29/2016 Pager [phone]   Patient seen and examined. Agree with assessment and plan. No chest pain . AF rate better controlled. Renal fxn stable.  Tolerating increased losartan and bidil. Will start eliquis 5 mg bid and dc heparin.   Troy Sine, MD, [facility] 03/29/2016 4:50 PM

## 2016-03-30 DIAGNOSIS — I481 Persistent atrial fibrillation: Secondary | ICD-10-CM

## 2016-03-30 LAB — COMPREHENSIVE METABOLIC PANEL
ALBUMIN: 2.4 g/dL — AB (ref 3.5–5.0)
ALK PHOS: 69 U/L (ref 38–126)
ALT: 57 U/L (ref 17–63)
AST: 48 U/L — AB (ref 15–41)
Anion gap: 8 (ref 5–15)
BILIRUBIN TOTAL: 0.6 mg/dL (ref 0.3–1.2)
BUN: 16 mg/dL (ref 6–20)
CALCIUM: 9.6 mg/dL (ref 8.9–10.3)
CO2: 23 mmol/L (ref 22–32)
Chloride: 110 mmol/L (ref 101–111)
Creatinine, Ser: 1.17 mg/dL (ref 0.61–1.24)
GFR calc Af Amer: >60 mL/min (ref >60)
GFR calc non Af Amer: >60 mL/min (ref >60)
Glucose, Bld: 118 mg/dL — ABNORMAL HIGH (ref 65–99)
POTASSIUM: 3.9 mmol/L (ref 3.5–5.1)
SODIUM: 141 mmol/L (ref 135–145)
TOTAL PROTEIN: 7 g/dL (ref 6.5–8.1)

## 2016-03-30 LAB — GLUCOSE, CAPILLARY
GLUCOSE-CAPILLARY: 112 mg/dL — AB (ref 65–99)
GLUCOSE-CAPILLARY: 263 mg/dL — AB (ref 65–99)
GLUCOSE-CAPILLARY: 224 mg/dL — AB (ref 65–99)
Glucose-Capillary: 125 mg/dL — ABNORMAL HIGH (ref 65–99)
Glucose-Capillary: 207 mg/dL — ABNORMAL HIGH (ref 65–99)
Glucose-Capillary: 279 mg/dL — ABNORMAL HIGH (ref 65–99)

## 2016-03-30 LAB — MAGNESIUM: MAGNESIUM: 2.2 mg/dL (ref 1.7–2.4)

## 2016-03-30 MED ORDER — METOPROLOL TARTRATE 50 MG PO TABS
50.0000 mg | ORAL_TABLET | Freq: Two times a day (BID) | ORAL | Status: DC
  Administered 2016-03-30 – 2016-04-01 (×4): 50 mg via ORAL
  Filled 2016-03-30 (×4): qty 1

## 2016-03-30 MED ORDER — LEVETIRACETAM 500 MG PO TABS
500.0000 mg | ORAL_TABLET | Freq: Two times a day (BID) | ORAL | Status: DC
  Administered 2016-03-30 – 2016-04-02 (×6): 500 mg via ORAL
  Filled 2016-03-30 (×6): qty 1

## 2016-03-30 MED ORDER — PAROXETINE HCL 20 MG PO TABS
10.0000 mg | ORAL_TABLET | Freq: Every day | ORAL | Status: DC
  Administered 2016-03-30 – 2016-04-02 (×4): 10 mg via ORAL
  Filled 2016-03-30 (×4): qty 1

## 2016-03-30 MED ORDER — GABAPENTIN 100 MG PO CAPS
100.0000 mg | ORAL_CAPSULE | Freq: Every day | ORAL | Status: DC
  Administered 2016-03-31 – 2016-04-02 (×3): 100 mg via ORAL
  Filled 2016-03-30 (×3): qty 1

## 2016-03-30 MED ORDER — TOPIRAMATE 25 MG PO TABS
25.0000 mg | ORAL_TABLET | Freq: Every day | ORAL | Status: DC
  Administered 2016-03-31 – 2016-04-01 (×2): 25 mg via ORAL
  Filled 2016-03-30 (×6): qty 1

## 2016-03-30 NOTE — Progress Notes (Signed)
Assumed report on Mr. Julian Ross, [id number].  He is up in chair with foley intact.  Denies pain.  Has telemetry box # 06.  Alert and oriented to wife at bedside.  Wife asked to inform us when she leaves room.  Chair alarm in place and turned on.  My name and phone number added to white boards and wife instructed to call if they need anything.

## 2016-03-30 NOTE — Progress Notes (Signed)
Pt. placed on CPAP for h/s, own unit currently on room air, tolerating well.

## 2016-03-30 NOTE — Progress Notes (Signed)
Physical Therapy Treatment Patient Details Name: Julian Ross MRN: [id number] DOB: 1946/01/29 Today's Date: 03/30/2016    History of Present Illness 70 yo M with CAD s/p MI and PCI, chronic systolic heart failure, hypertension, hyperlipidemia, and diabetes mellitus who is here with hypoxic respiratory failure and pneumonia, new-onset atrial fibrillation.    PT Comments    Pt remains confused. Pt was able to follow simple commands for mobility and assistance to initiate the movement. Pt stood with +2 assist and took a few steps to the chair with the RW. Pt is making slow, but steady progress towards goals and recommend continued skilled PT until d/c to SNF facility.  Follow Up Recommendations  SNF     Equipment Recommendations  None recommended by PT    Recommendations for Other Services       Precautions / Restrictions Precautions Precautions: Fall Restrictions Weight Bearing Restrictions: No    Mobility  Bed Mobility Overal bed mobility: Needs Assistance;+2 for physical assistance     Sidelying to sit: +2 for physical assistance;Mod assist       General bed mobility comments: +2 assist to initiate the movement due to confusion  Transfers Overall transfer level: Needs assistance Equipment used: Rolling walker (2 wheeled) Transfers: Sit to/from Stand Sit to Stand: +2 physical assistance;Mod assist         General transfer comment: Pt with difficult coming to full upright standing position, Multimodal cueing for safety, technique, and sequencing.  Ambulation/Gait Ambulation/Gait assistance: +2 physical assistance;Mod assist Ambulation Distance (Feet): 3 Feet Assistive device: Rolling walker (2 wheeled) Gait Pattern/deviations: Step-to pattern   Gait velocity interpretation: Below normal speed for age/gender     Stairs            Wheelchair Mobility    Modified Rankin (Stroke Patients Only)       Balance Overall balance assessment: Needs  assistance Sitting-balance support: No upper extremity supported Sitting balance-Leahy Scale: Poor     Standing balance support: Bilateral upper extremity supported Standing balance-Leahy Scale: Poor                      Cognition Arousal/Alertness: Awake/alert Behavior During Therapy: Flat affect Overall Cognitive Status: Impaired/Different from baseline Area of Impairment: Orientation;Attention;Memory;Following commands;Safety/judgement;Problem solving Orientation Level: Disoriented to;Person;Place;Time;Situation Current Attention Level: Focused Memory: Decreased recall of precautions;Decreased short-term memory Following Commands: Follows one step commands inconsistently Safety/Judgement: Decreased awareness of safety   Problem Solving: Slow processing;Decreased initiation;Requires verbal cues;Requires tactile cues;Difficulty sequencing      Exercises      General Comments        Pertinent Vitals/Pain Pain Assessment: Faces Pain Location: bottom Pain Descriptors / Indicators: Grimacing Pain Intervention(s): Limited activity within patient's tolerance;Monitored during session;Repositioned    Home Living                      Prior Function            PT Goals (current goals can now be found in the care plan section) Progress towards PT goals: Progressing toward goals    Frequency  Min 3X/week    PT Plan Current plan remains appropriate    Co-evaluation             End of Session Equipment Utilized During Treatment: Gait belt Activity Tolerance: Patient tolerated treatment well;Other (comment) (limited by confusion) Patient left: in chair;with call bell/phone within reach;with chair alarm set;with family/visitor present     Time: 1101-1130 PT  Time Calculation (min) (ACUTE ONLY): 29 min  Charges:  $Gait Training: 8-22 mins $Therapeutic Activity: 8-22 mins                    G Codes:      Lelon Mast 03/30/2016, 12:23  PM

## 2016-03-30 NOTE — Progress Notes (Signed)
CSW spoke with facility representative Ivin Booty at [facility] regarding patient's status. Per report, patient will possibly be ready for discharge on Sunday. Per Ivin Booty, she will accept weekend discharge. CSW to provide update when available. No further needs reported at this time.   CSW will continue to provide support to patient while in hospital. CSW will continue to assist with disposition plan.   Lucius Conn, [place]andra Worker [facility] Ph: [phone]

## 2016-03-30 NOTE — Progress Notes (Signed)
Patient Name: Julian Ross Date of Encounter: 03/30/2016  Principal Problem:   Septic shock ([place]) Active Problems:   CKD (chronic kidney disease), stage III   Edema leg   Diabetes mellitus type 2 in obese (HCC)   Elevated troponin   Anemia   Acute hypoxemic respiratory failure (HCC)   Altered mental status   Atrial fibrillation, new onset (HCC)   Acute on chronic systolic and diastolic heart failure, NYHA class 1 (HCC)   Acute respiratory failure with hypoxemia (HCC)   AKI (acute kidney injury) ([place])   Acute respiratory failure with hypoxia (HCC)   Chronic systolic CHF (congestive heart failure) (HCC)   Cardiomyopathy, ischemic   New onset atrial fibrillation ([place])   Demand ischemia ([place])   Acute renal failure superimposed on stage 3 chronic kidney disease (HCC)   Hypokalemia   Rheumatoid arthritis of multiple sites with negative rheumatoid factor ([place])   Acute encephalopathy   Anxiety state   Primary Cardiologist: New Patient Profile: 41M with CAD s/p MI and PCI, chronic systolic heart failure, hypertension, hyperlipidemia, and diabetes mellitus who is here with hypoxic respiratory failure and pneumonia, new-onset atrial fibrillation.   SUBJECTIVE: He is confused, not able to verbalize where he is.   OBJECTIVE Filed Vitals:   03/29/16 0943 03/29/16 1202 03/29/16 2100 03/30/16 0440  BP: 119/66 101/63 110/69 143/84  Pulse: 102 67 57 92  Temp: 97.9 F (36.6 C) 98.6 F (37 C) 98.4 F (36.9 C) 98 F (36.7 C)  TempSrc: Oral Oral Oral Oral  Resp: 18 18 18 20   Height:      Weight:    194 lb 11.2 oz (88.315 kg)  SpO2: 97% 98% 98% 96%    Intake/Output Summary (Last 24 hours) at 03/30/16 0740 Last data filed at 03/30/16 0600  Gross per 24 hour  Intake    420 ml  Output   2352 ml  Net  -1932 ml   Filed Weights   03/28/16 0644 03/29/16 0926 03/30/16 0440  Weight: 202 lb 6.4 oz (91.808 kg) 197 lb 3.2 oz (89.449 kg) 194 lb 11.2 oz (88.315 kg)    PHYSICAL  EXAM General: Well developed, well nourished, male in no acute distress. Head: Normocephalic, atraumatic.  Neck: Supple without bruits, 4-5 cm JVD. Lungs:  Resp regular and unlabored, rhonchi in bilateral lower lobes.  Heart: RRR, S1, S2, no S3, S4, or murmur; no rub.  Abdomen: Soft, non-tender, non-distended, BS + x 4.  Extremities: No clubbing, cyanosis,Generalized edema.  Neuro: Alert, confused.  Oriented to self only. Moves all extremities spontaneously. Psych: Agitated at times  LABS: CBC: Recent Labs  03/28/16 0344 03/29/16 0331  WBC 16.9* 19.4*  NEUTROABS 13.5*  --   HGB 11.8* 11.4*  HCT 35.3* 34.3*  MCV 90.7 89.6  PLT 382 [id number]   Basic Metabolic Panel: Recent Labs  03/29/16 0331 03/29/16 0917 03/30/16 0444  NA 141  --  141  K 2.7*  --  3.9  CL 107  --  110  CO2 22  --  23  GLUCOSE 66  --  118*  BUN 19  --  16  CREATININE 1.17  --  1.17  CALCIUM 9.1  --  9.6  MG 2.1 1.9 2.2   Liver Function Tests: Recent Labs  03/29/16 0331 03/30/16 0444  AST 53* 48*  ALT 49 57  ALKPHOS 61 69  BILITOT 0.6 0.6  PROT 6.4* 7.0  ALBUMIN 2.2* 2.4*   BNP:  B  NATRIURETIC PEPTIDE  Date/Time Value Ref Range Status  03/19/2016 12:10 AM 385.9* 0.0 - 100.0 pg/mL Final   Anemia Panel: Recent Labs  03/27/16 2000  VITAMINB12 876  FOLATE 18.7  FERRITIN 421*  TIBC 256  IRON 39*  RETICCTPCT 2.1     Current facility-administered medications:  .  0.9 %  sodium chloride infusion, 250 mL, Intravenous, PRN, Dannielle Burn, MD, Last Rate: 10 mL/hr at 03/27/16 0700, 250 mL at 03/27/16 0700 .  acetaminophen (TYLENOL) tablet 650 mg, 650 mg, Oral, Q6H PRN, Juanito Doom, MD, 650 mg at 03/19/16 1355 .  antiseptic oral rinse (CPC / CETYLPYRIDINIUM CHLORIDE 0.05%) solution 7 mL, 7 mL, Mouth Rinse, BID, Juanito Doom, MD, 7 mL at 03/29/16 2254 .  apixaban (ELIQUIS) tablet 5 mg, 5 mg, Oral, BID, Arbutus Leas, NP, 5 mg at 03/29/16 1841 .  clopidogrel (PLAVIX) tablet 75 mg,  75 mg, Per Tube, Daily, Rush Farmer, MD, 75 mg at 03/29/16 0944 .  famotidine (PEPCID) tablet 20 mg, 20 mg, Oral, Daily, Nita Sells, MD .  feeding supplement (ENSURE ENLIVE) (ENSURE ENLIVE) liquid 237 mL, 237 mL, Oral, Q24H, Ardeen Garland, RD, 237 mL at 03/29/16 1402 .  fentaNYL (SUBLIMAZE) injection 12.5 mcg, 12.5 mcg, Intravenous, Q3H PRN, Nita Sells, MD .  folic acid (FOLVITE) tablet 1 mg, 1 mg, Oral, Daily, Juanito Doom, MD, 1 mg at 03/29/16 1029 .  furosemide (LASIX) tablet 40 mg, 40 mg, Oral, BID, Arbutus Leas, NP, 40 mg at 03/29/16 1818 .  gabapentin (NEURONTIN) capsule 100 mg, 100 mg, Oral, BID, Nita Sells, MD, 100 mg at 03/29/16 2248 .  guaiFENesin tablet 200 mg, 200 mg, Oral, Q4H PRN, Rush Farmer, MD, 200 mg at 03/27/16 1011 .  hydrALAZINE (APRESOLINE) injection 10-40 mg, 10-40 mg, Intravenous, Q4H PRN, Juanito Doom, MD, 20 mg at 03/27/16 0404 .  Influenza vac split quadrivalent PF (FLUARIX) injection 0.5 mL, 0.5 mL, Intramuscular, Prior to discharge, Rush Farmer, MD .  insulin aspart (novoLOG) injection 0-20 Units, 0-20 Units, Subcutaneous, 6 times per day, Juanito Doom, MD, 7 Units at 03/30/16 0030 .  insulin glargine (LANTUS) injection 5 Units, 5 Units, Subcutaneous, Daily, Nita Sells, MD .  ipratropium-albuterol (DUONEB) 0.5-2.5 (3) MG/3ML nebulizer solution 3 mL, 3 mL, Nebulization, Q2H PRN, Norval Morton, MD, 3 mL at 03/19/16 0453 .  isosorbide-hydrALAZINE (BIDIL) 20-37.5 MG per tablet 1 tablet, 1 tablet, Oral, Q8H, Troy Sine, MD, 1 tablet at 03/30/16 0215 .  levETIRAcetam (KEPPRA) tablet 1,000 mg, 1,000 mg, Oral, Q12H, Nita Sells, MD, 1,000 mg at 03/30/16 0611 .  LORazepam (ATIVAN) injection 1 mg, 1 mg, Intravenous, Q4H PRN, Nita Sells, MD .  losartan (COZAAR) tablet 50 mg, 50 mg, Oral, BID, Troy Sine, MD, 50 mg at 03/29/16 1819 .  magnesium oxide (MAG-OX) tablet 400 mg, 400 mg, Oral, BID,  Nita Sells, MD, 400 mg at 03/29/16 2248 .  metoprolol tartrate (LOPRESSOR) tablet 37.5 mg, 37.5 mg, Oral, BID, Bhavinkumar Bhagat, PA, 37.5 mg at 03/29/16 2247 .  naphazoline-glycerin (CLEAR EYES) ophth solution 2 drop, 2 drop, Both Eyes, QID PRN, Nita Sells, MD, 2 drop at 03/29/16 1401 .  ondansetron (ZOFRAN) injection 4 mg, 4 mg, Intravenous, Q8H PRN, Mauri Brooklyn, MD .  PARoxetine (PAXIL) tablet 40 mg, 40 mg, Oral, Daily, Juanito Doom, MD, 40 mg at 03/29/16 1029 .  pneumococcal 23 valent vaccine (PNU-IMMUNE) injection 0.5 mL, 0.5 mL, Intramuscular, Prior  to discharge, Rush Farmer, MD .  potassium chloride SA (K-DUR,KLOR-CON) CR tablet 40 mEq, 40 mEq, Oral, TID, Nita Sells, MD, 40 mEq at 03/29/16 2247 .  predniSONE (DELTASONE) tablet 5 mg, 5 mg, Oral, Q breakfast, Raylene Miyamoto, MD, 5 mg at 03/29/16 0848 .  topiramate (TOPAMAX) tablet 25 mg, 25 mg, Oral, BID, Juanito Doom, MD, 25 mg at 03/29/16 2253    TELE: Afib rate remains in 100's.      Radiology/Studies: Dg Chest Port 1 View  03/28/2016  CLINICAL DATA:  Pneumonia, hypertension, diabetes mellitus EXAM: PORTABLE CHEST 1 VIEW COMPARISON:  Portable exam 1823 hours compared to 03/25/2016 FINDINGS: Slight rotation to the LEFT. Normal heart size, mediastinal contours and pulmonary vascularity for technique. Coronary arterial stent noted. Infiltrates identified in the periphery of the mid to lower LEFT lung question pneumonia. Minimal RIGHT basilar atelectasis. No definite pleural effusion, pneumothorax or acute osseous findings. IMPRESSION: Persistent infiltrate LEFT lung. Electronically Signed   By: Lavonia Dana M.D.   On: 03/28/2016 18:45     Current Medications:  . antiseptic oral rinse  7 mL Mouth Rinse BID  . apixaban  5 mg Oral BID  . clopidogrel  75 mg Per Tube Daily  . famotidine  20 mg Oral Daily  . feeding supplement (ENSURE ENLIVE)  237 mL Oral Q24H  . folic acid  1 mg Oral Daily  .  furosemide  40 mg Oral BID  . gabapentin  100 mg Oral BID  . insulin aspart  0-20 Units Subcutaneous 6 times per day  . insulin glargine  5 Units Subcutaneous Daily  . isosorbide-hydrALAZINE  1 tablet Oral Q8H  . levETIRAcetam  1,000 mg Oral Q12H  . losartan  50 mg Oral BID  . magnesium oxide  400 mg Oral BID  . metoprolol tartrate  37.5 mg Oral BID  . PARoxetine  40 mg Oral Daily  . potassium chloride  40 mEq Oral TID  . predniSONE  5 mg Oral Q breakfast  . topiramate  25 mg Oral BID      ASSESSMENT AND PLAN: Principal Problem:   Septic shock (HCC) Active Problems:   CKD (chronic kidney disease), stage III   Edema leg   Diabetes mellitus type 2 in obese (HCC)   Elevated troponin   Anemia   Acute hypoxemic respiratory failure (HCC)   Altered mental status   Atrial fibrillation, new onset (HCC)   Acute on chronic systolic and diastolic heart failure, NYHA class 1 (HCC)   Acute respiratory failure with hypoxemia (HCC)   AKI (acute kidney injury) ([place])   Acute respiratory failure with hypoxia (HCC)   Chronic systolic CHF (congestive heart failure) (HCC)   Cardiomyopathy, ischemic   New onset atrial fibrillation ([place])   Demand ischemia ([place])   Acute renal failure superimposed on stage 3 chronic kidney disease (HCC)   Hypokalemia   Rheumatoid arthritis of multiple sites with negative rheumatoid factor (HCC)   Acute encephalopathy   Anxiety state  1. New onset atrial fibrillation: On metoprolol  37.5mg  BID. He cannot tolerate IV diltiazem as it caused significant bradycardia. He rate remains in 100's, can increase metoprolol to 50mg  BID.    CHADSVASC score at least 4. His heparin was discontinued yesterday, started on Eliquis.   2. Chronic systolic and diastolic heart failure: Echo this admission reveals EF of 30-35% with focal wall motion abnormalities. Weight down to 194 lbs, weight down 46 pounds since admission. On po furosemide.  Continue goal dose Bidil, BB and ARB.   Cr is stable.    3. CAD: Mr. Freire has an extensive history of CAD with up to 16 prior stents. Followed in [place] by cardiologist. Troponin was mildly elevated at 0.13 upon admission. EKG has not been concerning for active ischemia. He has had several episodes of ventricular ectopy. This seems to have improved on metoprolol. Continue home Plavix. He is not on a statin as he has previously been intolerant of statins.  LDL is 67.      Signed, Arbutus Leas , NP 7:40 AM 03/30/2016 Pager [phone]   Patient seen and examined. Agree with assessment and plan. I/O since admission -13,967.  Wt 240 -->194 today.  Cr stable at 1.17; GFR > 60. Remains intermittently confused. HR 90-100; will increase metoprolol to 50 mg bid.  Need to get out of bed. Tolerating eliquis; no bleeding.   Troy Sine, MD, [facility] 03/30/2016 8:39 AM

## 2016-03-30 NOTE — Progress Notes (Signed)
[facility] TEAM 1 - Stepdown/ICU TEAM Progress Note  Julian Ross [id number] DOB: August 22, 1946 DOA: 03/18/2016 PCP: No PCP Per Patient  Admit HPI / Brief Narrative: 70 year old male PMHx Substance Abuse (polypharmacy), partial blindness HTN, Chronic Systolic CHF HLD, DM Type 2, MI, CAD native artery  s/p PCI;   Who presents with progressively worsening shortness of breath over the last 2 days. Patient actually reports having difficulty with shortness of breath over the last 2-3 months with multiple falls which she related to generalized weakness. However in the last 2 days he noted that his voice was deepening and he was congested. Denies coughing during the day but at night while utilizing his CPAP mask he would seem to cough more. Unable to produce any sputum. Also noted complaints of chest tightness like someone was squeezing his chest. He endorses nasal congestion, decreased activity, and malaise. Patient notes previously having pneumonia 2 years ago in which he ended up in the ICU, but this time was not similar in that he was able to cough up mucus.   admission 102.59F, heart rate 151, blood pressure 69/49, O2 sats 89%. Chest x-ray and seen have bilateral infiltrates suggestive of pneumonia.  Lab work reveals WBC 16.7, lactic acid 4.52, creatinine 2.1 BUN 21.  Patient noted weight 2 weeks ago was 220 pounds however tonight was weighed at 240 pounds.  He was ultimately admitted and critical care consulted on him because of metabolic encephalopathy, atrial fibrillation He was intubated again 3/24 His RA SS was kept at -1 to -3 for vent synchrony  HPI/Subjective:  Less agitated and making somewhat more sense Still tangential Able to move all 4 extremities without deficit Family at the bedside Does not make enough sense to get a reliable review of systems   Assessment/Plan: Acute Respiratory Failure with Hypoxia /HCAP/Severe Sepsis -Complete 10 days of aztreonam per PCCM -Flutter  valve -Out of bed to chair/ambulate -PT; recommends SNF when more orietned  OSA - on CPAP,  -CPAP per respiratory  Chronic systolic CHF (LVEF [id number] cardiomyopathy -Lasix 40 mg  BID -Metoprolol 37.5 mg bid-->50 twice a day per cardiology 03/30/2016 -Cozaar 50 mg bid  -BIDIL 20-30 7.5 mg bid -Strict in and out since admission -10.6 L so far since admission -Daily weight has dropped from 208-->202-->197--> pounds--needs to be weighed again today -Wife questions whether we should do cardiac cath versus not and I feel this is reasonable but  may need to be followed up as an outpatient with his cardiologist in [place] his metabolic encephalopathy I would not sedate him for this at present time  New onset A fib - improved control -Currently in A. fib but rate controlled. -See chronic systolic CHF  Elevated troponin/Demand ischemia  -Secondary to A. fib with RVR new-onset  Hx MI, CAD s/p PCI - reportedly has 16 stents per wife -Currently not a factor -Continue plavix 75 daily  Acute on CKD STAGE III -Continue monitor closely -SER CRE trending from 22/2.09-->16/1.17   03/30/16  Hypokalemia -Potassium goal> 4 -K Dur 40 mEq 3 times a day as of 3/30 -Continue magnesium as below  Hypomagnesemia  -Magnesium goal> 2 -Magnesium 03/29/16 = 1.9. Replace orally Mag-Ox 400 daily-->bid 3/30 -Recheck magnesium /1/70  Anemia of chronic disease? -Anemia panel  showed iron level  39, saturation ratio 15 - anemia is not microcytic however -Transfuse for hemoglobin<8  RA - on plaquenil / prednisone 5mg  QD -Hold home plaquenil -Restart prednisone 5 mg daily (home dose)  -needs to  follow-up with rheumatologist as an outpatient  DM Type II uncontrolled/Diabetic Neuropathy  -3/19 Hemoglobin A1c = 9.2    -Lantus 8 mg - CBG range = 112-279  Recurrent tremors/Essential Tremor -> myoclonus per neurology, resolved -Essential tremor not evident on exam  Anxiety  , chronic  pain, polypharmacy Acute encephalopathy  - uses xanax at home, family reports polypharmacy -Patient now 10 days out, should be through withdrawal  Period. -Avoid benzodiazepine  -taper Gabapentin 300 tid-->100 bid-->daily 03/30/2016, Fentanyl from 12.5-25-->12.5--discontinued 03/30/2016, lorazepam  2 mg every 4-1 mg every 4, Keppra 1000 every 12-->500 twice a day 03/30/2016, Topamax 25 twice a day-->25 daily 03/30/2016  we'll continue to taper and delineate his medications as appropriate He is slowly improving    Partial Blindness    Code Status: Partial Family Communication:  long discussion with son 03/28/16 Disposition Plan: SNF   >35 minutes  Consultants: [facility] Dr.Daniel Lily Kocher [facility]   Procedure/Significant Events:  CT Head 3/19 >> no acute process, chronic atrophy & small vessl disease 3/20 echocardiogram;- Left ventricle: mild LVH. -LVEF  30% to 35%. Global hypokinesis with severe  inferior hypokinesis to akinesis. - Pulmonary arteries: PA peak pressure: 35 mm Hg (S). 3/20 RLE Doppler; negative DVT.-Complicated mixed cystic area noted in the right popliteal fossa, Atypical baker's cyst? EEG 3/21 > slowing but no seizure EEG 3/22> no seizure 3/24 CT head > chronic microvascular change, nothing acute 3/24 CXR > worsenign bilateral airspace disease   Culture Blood cx 3/19 >> RVP 3/19 >> neg Flu 3/19 >> neg   Antibiotics: Vanc 3/19 >> 3/22 Aztreonam 3/19 >>  Levaquin 3/19 >> 3/22  DVT prophylaxis: Lovenox   Devices    LINES / TUBES:  ETT 3/20 > 3/23>>>3/26 Foley 3/20    Continuous Infusions:    Objective: VITAL SIGNS: Temp: 98.4 F (36.9 C) (03/31 1326) Temp Source: Oral (03/31 1326) BP: 101/62 mmHg (03/31 1326) Pulse Rate: 96 (03/31 1326) SPO2; FIO2:   Intake/Output Summary (Last 24 hours) at 03/30/16 1533 Last data filed at 03/30/16 1300  Gross per 24 hour  Intake   1320 ml  Output   2002 ml  Net   -682 ml       Exam: General:A/O x 3 (does not know where) clearly   Eyes: Negative headache, negative scleral hemorrhage ENT: Negative Runny nose, negative gingival bleeding, Neck:  Negative scars, masses, torticollis, lymphadenopathy, JVD Lungs: Clear to auscultation bilaterally without wheezes or crackles Cardiovascular: Irregular irregular rhythm and rate, negative murmur gallop or rub normal S1 and S2 Moving all 4 limbs equally    Data Reviewed: Basic Metabolic Panel:  Recent Labs Lab 03/23/16 1621  03/24/16 1435 03/24/16 2008  03/25/16 0805  03/26/16 0248 03/27/16 0218 03/28/16 0344 03/29/16 0331 03/29/16 0917 03/30/16 0444  NA  --   --  149*  --   < >  --   < > 147* 142 142 141  --  141  K  --   < > 3.1*  --   < >  --   < > 3.5 3.6 3.1* 2.7*  --  3.9  CL  --   --  112*  --   < >  --   < > 109 108 108 107  --  110  CO2  --   --  24  --   < >  --   < > 25 20* 21* 22  --  23  GLUCOSE  --   --  302*  --   < >  --   < > 173* 137* 166* 66  --  118*  BUN  --   --  46*  --   < >  --   < > 32* 29* 21* 19  --  16  CREATININE  --   --  1.21  --   < >  --   < > 1.05 1.01 1.08 1.17  --  1.17  CALCIUM  --   --  8.9  --   < >  --   < > 9.0 9.2 9.2 9.1  --  9.6  MG 2.0  --   --   --   --  2.1  --   --  1.9 2.1 2.1 1.9 2.2  PHOS 2.3*  --  2.0* 2.1*  --  1.5*  --   --  3.2  --   --   --   --   < > = values in this interval not displayed. Liver Function Tests:  Recent Labs Lab 03/28/16 0344 03/29/16 0331 03/30/16 0444  AST 46* 53* 48*  ALT 39 49 57  ALKPHOS 61 61 69  BILITOT 0.4 0.6 0.6  PROT 6.4* 6.4* 7.0  ALBUMIN 2.2* 2.2* 2.4*   No results for input(s): LIPASE, AMYLASE in the last 168 hours. No results for input(s): AMMONIA in the last 168 hours. CBC:  Recent Labs Lab 03/24/16 0406 03/25/16 0300 03/26/16 0248 03/27/16 0218 03/28/16 0344 03/29/16 0331  WBC 12.2* 12.0* 15.2* 16.4* 16.9* 19.4*  NEUTROABS 9.6* 9.4*  --   --  13.5*  --   HGB 9.8* 9.4* 10.7* 11.9* 11.8*  11.4*  HCT 29.9* 30.2* 32.4* 35.0* 35.3* 34.3*  MCV 93.7 93.8 93.4 93.1 90.7 89.6  PLT 269 334 288 354 382 397   Cardiac Enzymes: No results for input(s): CKTOTAL, CKMB, CKMBINDEX, TROPONINI in the last 168 hours. BNP (last 3 results)  Recent Labs  03/19/16 0010  BNP 385.9*    ProBNP (last 3 results) No results for input(s): PROBNP in the last 8760 hours.  CBG:  Recent Labs Lab 03/29/16 1619 03/29/16 2357 03/30/16 0437 03/30/16 0734 03/30/16 1145  GLUCAP 198* 207* 112* 125* 279*    No results found for this or any previous visit (from the past 240 hour(s)).   Studies:  Recent x-ray studies have been reviewed in detail by the Attending Physician  Scheduled Meds:  Scheduled Meds: . antiseptic oral rinse  7 mL Mouth Rinse BID  . apixaban  5 mg Oral BID  . clopidogrel  75 mg Per Tube Daily  . famotidine  20 mg Oral Daily  . feeding supplement (ENSURE ENLIVE)  237 mL Oral Q24H  . folic acid  1 mg Oral Daily  . furosemide  40 mg Oral BID  . gabapentin  100 mg Oral BID  . insulin aspart  0-20 Units Subcutaneous 6 times per day  . insulin glargine  5 Units Subcutaneous Daily  . isosorbide-hydrALAZINE  1 tablet Oral Q8H  . levETIRAcetam  500 mg Oral Q12H  . losartan  50 mg Oral BID  . magnesium oxide  400 mg Oral BID  . metoprolol tartrate  37.5 mg Oral BID  . PARoxetine  10 mg Oral Daily  . potassium chloride  40 mEq Oral TID  . predniSONE  5 mg Oral Q breakfast  . [START ON 03/31/2016] topiramate  25 mg Oral Daily    Time spent on  care of this patient: 51 mins  Verneita Griffes, MD [facility] [phone]

## 2016-03-30 NOTE — Discharge Instructions (Signed)

## 2016-03-31 LAB — COMPREHENSIVE METABOLIC PANEL
ALBUMIN: 2.3 g/dL — AB (ref 3.5–5.0)
ALK PHOS: 61 U/L (ref 38–126)
ALT: 45 U/L (ref 17–63)
AST: 32 U/L (ref 15–41)
Anion gap: 10 (ref 5–15)
BILIRUBIN TOTAL: 0.7 mg/dL (ref 0.3–1.2)
BUN: 17 mg/dL (ref 6–20)
CALCIUM: 9.5 mg/dL (ref 8.9–10.3)
CO2: 20 mmol/L — ABNORMAL LOW (ref 22–32)
CREATININE: 1.13 mg/dL (ref 0.61–1.24)
Chloride: 110 mmol/L (ref 101–111)
GFR calc Af Amer: >60 mL/min (ref >60)
GLUCOSE: 131 mg/dL — AB (ref 65–99)
Potassium: 3.8 mmol/L (ref 3.5–5.1)
Sodium: 140 mmol/L (ref 135–145)
Total Protein: 7.2 g/dL (ref 6.5–8.1)

## 2016-03-31 LAB — GLUCOSE, CAPILLARY
GLUCOSE-CAPILLARY: 126 mg/dL — AB (ref 65–99)
GLUCOSE-CAPILLARY: 126 mg/dL — AB (ref 65–99)
GLUCOSE-CAPILLARY: 254 mg/dL — AB (ref 65–99)
Glucose-Capillary: 166 mg/dL — ABNORMAL HIGH (ref 65–99)
Glucose-Capillary: 236 mg/dL — ABNORMAL HIGH (ref 65–99)
Glucose-Capillary: 216 mg/dL — ABNORMAL HIGH (ref 65–99)

## 2016-03-31 LAB — MAGNESIUM: Magnesium: 2.1 mg/dL (ref 1.7–2.4)

## 2016-03-31 NOTE — Progress Notes (Signed)
Subjective: No CP  Breathing is OK   Objective: Filed Vitals:   03/30/16 1735 03/30/16 2115 03/31/16 0149 03/31/16 0533  BP: 125/88 121/83 134/76 144/87  Pulse: 94 95 81 94  Temp:  98.5 F (36.9 C)  97.8 F (36.6 C)  TempSrc:  Oral  Oral  Resp:  16 18 20   Height:      Weight:    190 lb 9.6 oz (86.456 kg)  SpO2:  99%  97%   Weight change: -6 lb 9.6 oz (-2.994 kg)  Intake/Output Summary (Last 24 hours) at 03/31/16 0932 Last data filed at 03/31/16 0900  Gross per 24 hour  Intake   3460 ml  Output      1 ml  Net   3459 ml    General: Alert, awake, oriented x3, in no acute distress Neck:  JVP is normal Heart: Regular rate and rhythm, without murmurs, rubs, gallops.  Lungs: Clear to auscultation.  No rales or wheezes. Exemities:  No edema.   Neuro: Grossly intact, nonfocal.  Tele:  Afib  90s to 100 Lab Results: Results for orders placed or performed during the hospital encounter of 03/18/16 (from the past 24 hour(s))  Glucose, capillary     Status: Abnormal   Collection Time: 03/30/16 11:45 AM  Result Value Ref Range   Glucose-Capillary 279 (H) 65 - 99 mg/dL  Glucose, capillary     Status: Abnormal   Collection Time: 03/30/16  4:20 PM  Result Value Ref Range   Glucose-Capillary 263 (H) 65 - 99 mg/dL  Glucose, capillary     Status: Abnormal   Collection Time: 03/30/16  8:59 PM  Result Value Ref Range   Glucose-Capillary 224 (H) 65 - 99 mg/dL  Glucose, capillary     Status: Abnormal   Collection Time: 03/31/16 12:23 AM  Result Value Ref Range   Glucose-Capillary 166 (H) 65 - 99 mg/dL   Comment 1 Notify RN   Comprehensive metabolic panel     Status: Abnormal   Collection Time: 03/31/16  3:48 AM  Result Value Ref Range   Sodium 140 135 - 145 mmol/L   Potassium 3.8 3.5 - 5.1 mmol/L   Chloride 110 101 - 111 mmol/L   CO2 20 (L) 22 - 32 mmol/L   Glucose, Bld 131 (H) 65 - 99 mg/dL   BUN 17 6 - 20 mg/dL   Creatinine, Ser 1.13 0.61 - 1.24 mg/dL   Calcium 9.5 8.9 -  10.3 mg/dL   Total Protein 7.2 6.5 - 8.1 g/dL   Albumin 2.3 (L) 3.5 - 5.0 g/dL   AST 32 15 - 41 U/L   ALT 45 17 - 63 U/L   Alkaline Phosphatase 61 38 - 126 U/L   Total Bilirubin 0.7 0.3 - 1.2 mg/dL   GFR calc non Af Amer >60 >60 mL/min   GFR calc Af Amer >60 >60 mL/min   Anion gap 10 5 - 15  Magnesium     Status: None   Collection Time: 03/31/16  3:48 AM  Result Value Ref Range   Magnesium 2.1 1.7 - 2.4 mg/dL  Glucose, capillary     Status: Abnormal   Collection Time: 03/31/16  5:31 AM  Result Value Ref Range   Glucose-Capillary 126 (H) 65 - 99 mg/dL   Comment 1 Notify RN   Glucose, capillary     Status: Abnormal   Collection Time: 03/31/16  8:06 AM  Result Value Ref Range   Glucose-Capillary 126 (H)  65 - 99 mg/dL    Studies/Results: No results found.  Medications:REviewed   @PROBHOSP @  1  Atrial fib  Rates controlled today   ON Eliquis    2  Chronic systolic CHF  LVEF 30 to [id number]  Continue curren meds    3.  CAD  Followed in [place]  Triv elevation of troponin.  DO not plan ischemic eval at this point    Keep on plavix for now  Will need to review  Not on ASA  Extensive stents  H/H will need to be followed closely    4  HL  Intolerant to statins   LOS: 13 days   Dorris Carnes 03/31/2016, 9:32 AM

## 2016-03-31 NOTE — Progress Notes (Signed)
Fernanda Chorley [id number] DOB: 1946/11/03 DOA: 03/18/2016 PCP: No PCP Per Patient  Admit HPI / Brief Narrative: 70 year old male PMHx Substance Abuse (polypharmacy), partial blindness HTN, Chronic Systolic CHF HLD, DM Type 2, MI, CAD native artery  s/p PCI;   Who presents with progressively worsening shortness of breath over the last 2 days. Patient actually reports having difficulty with shortness of breath over the last 2-3 months with multiple falls which she related to generalized weakness. However in the last 2 days he noted that his voice was deepening and he was congested. Denies coughing during the day but at night while utilizing his CPAP mask he would seem to cough more. Unable to produce any sputum. Also noted complaints of chest tightness like someone was squeezing his chest. He endorses nasal congestion, decreased activity, and malaise. Patient notes previously having pneumonia 2 years ago in which he ended up in the ICU, but this time was not similar in that he was able to cough up mucus.   admission 102.66F, heart rate 151, blood pressure 69/49, O2 sats 89%. Chest x-ray and seen have bilateral infiltrates suggestive of pneumonia.  Lab work reveals WBC 16.7, lactic acid 4.52, creatinine 2.1 BUN 21.  Patient noted weight 2 weeks ago was 220 pounds however tonight was weighed at 240 pounds.  He was ultimately admitted and critical care consulted on him because of metabolic encephalopathy, atrial fibrillation He was intubated again 3/24 His RA SS was kept at -1 to -3 for vent synchrony  HPI/Subjective:  Less agitated and making somewhat more sense Much more improved   Assessment/Plan: Acute Respiratory Failure with Hypoxia /HCAP/Severe Sepsis -Complete 10 days of aztreonam per PCCM -Flutter valve -Out of bed to chair/ambulate -PT; recommends SNF when more orietned  OSA - on CPAP,  -CPAP per respiratory  Chronic systolic CHF (LVEF XX123456 cardiomyopathy -Lasix  40 mg  BID -Metoprolol 37.5 mg bid-->50 twice a day per cardiology 03/30/2016 -Cozaar 50 mg bid  -BIDIL 20-30 7.5 mg bid -Strict in and out since admission -4.5 L so far since admission -Daily weight has dropped from 208-->202-->197-->190 pounds -No cardiac cath  New onset A fib - improved control -Currently in A. fib but rate controlled. -See chronic systolic CHF  Elevated troponin/Demand ischemia  -Secondary to A. fib with RVR new-onset  Hx MI, CAD s/p PCI - reportedly has 16 stents per wife -Currently not a factor -Continue plavix 75 daily  Acute on CKD STAGE III -Continue monitor closely -SER CRE trending from 22/2.09-->16/1.17   03/30/16  Hypokalemia -Potassium goal> 4 -K Dur 40 mEq 3 times a day as of 3/30 -Continue magnesium as below  Hypomagnesemia  -Magnesium goal> 2 -Magnesium 03/29/16 = 1.9. Replace orally Mag-Ox 400 daily-->bid 3/30  Anemia of chronic disease? -Anemia panel  showed iron level  39, saturation ratio 15 - anemia is not microcytic however -Transfuse for hemoglobin<8  RA - on plaquenil / prednisone 5mg  QD -Hold home plaquenil -Restart prednisone 5 mg daily (home dose)  -needs to follow-up with rheumatologist as an outpatient  DM Type II uncontrolled/Diabetic Neuropathy  -3/19 Hemoglobin A1c = 9.2    -Lantus 8 mg - CBG range = 112-279  Recurrent tremors/Essential Tremor -> myoclonus per neurology, resolved -Essential tremor not evident on exam  Anxiety  , chronic pain, polypharmacy Acute encephalopathy  - uses xanax at home, family reports polypharmacy -Patient now 10 days out, should be through withdrawal  Period. -Avoid benzodiazepine  -taper Gabapentin 300 tid-->100 bid-->daily 03/30/2016,  Fentanyl from 12.5-25-->12.5--discontinued 03/30/2016, lorazepam  2 mg every 4-1 mg every 4, Keppra 1000 every 12-->500 twice a day 03/30/2016, Topamax 25 twice a day-->25 daily 03/30/2016  we'll continue to taper and delineate his medications as  appropriate He is slowly improving  Partial Blindness    Code Status: Partial Family Communication:  long discussion with son 03/28/16 Disposition Plan: SNF   >35 minutes  Consultants: [facility] Dr.Daniel Lily Kocher [facility]   Procedure/Significant Events:  CT Head 3/19 >> no acute process, chronic atrophy & small vessl disease 3/20 echocardiogram;- Left ventricle: mild LVH. -LVEF  30% to 35%. Global hypokinesis with severe  inferior hypokinesis to akinesis. - Pulmonary arteries: PA peak pressure: 35 mm Hg (S). 3/20 RLE Doppler; negative DVT.-Complicated mixed cystic area noted in the right popliteal fossa, Atypical baker's cyst? EEG 3/21 > slowing but no seizure EEG 3/22> no seizure 3/24 CT head > chronic microvascular change, nothing acute 3/24 CXR > worsenign bilateral airspace disease   Culture Blood cx 3/19 >> RVP 3/19 >> neg Flu 3/19 >> neg   Antibiotics: Vanc 3/19 >> 3/22 Aztreonam 3/19 >>  Levaquin 3/19 >> 3/22  DVT prophylaxis: Lovenox   Devices    LINES / TUBES:  ETT 3/20 > 3/23>>>3/26 Foley 3/20    Continuous Infusions:    Objective: VITAL SIGNS: Temp: 97.3 F (36.3 C) (04/01 1203) Temp Source: Oral (04/01 1203) BP: 137/73 mmHg (04/01 1203) Pulse Rate: 88 (04/01 1203) SPO2; FIO2:   Intake/Output Summary (Last 24 hours) at 03/31/16 1712 Last data filed at 03/31/16 1300  Gross per 24 hour  Intake   3760 ml  Output      0 ml  Net   3760 ml     Exam: General:A/O x 3 (does not know where) clearly   Eyes: Negative headache, negative scleral hemorrhage ENT: Negative Runny nose, negative gingival bleeding, Neck:  Negative scars, masses, torticollis, lymphadenopathy, JVD Lungs: Clear to auscultation bilaterally without wheezes or crackles Cardiovascular: Irregular irregular rhythm and rate, negative murmur gallop or rub normal S1 and S2 Moving all 4 limbs equally    Data Reviewed: Basic Metabolic  Panel:  Recent Labs Lab 03/24/16 2008  03/25/16 0805  03/27/16 0218 03/28/16 0344 03/29/16 0331 03/29/16 0917 03/30/16 0444 03/31/16 0348  NA  --   < >  --   < > 142 142 141  --  141 140  K  --   < >  --   < > 3.6 3.1* 2.7*  --  3.9 3.8  CL  --   < >  --   < > 108 108 107  --  110 110  CO2  --   < >  --   < > 20* 21* 22  --  23 20*  GLUCOSE  --   < >  --   < > 137* 166* 66  --  118* 131*  BUN  --   < >  --   < > 29* 21* 19  --  16 17  CREATININE  --   < >  --   < > 1.01 1.08 1.17  --  1.17 1.13  CALCIUM  --   < >  --   < > 9.2 9.2 9.1  --  9.6 9.5  MG  --   < > 2.1  --  1.9 2.1 2.1 1.9 2.2 2.1  PHOS 2.1*  --  1.5*  --  3.2  --   --   --   --   --   < > =  values in this interval not displayed. Liver Function Tests:  Recent Labs Lab 03/28/16 0344 03/29/16 0331 03/30/16 0444 03/31/16 0348  AST 46* 53* 48* 32  ALT 39 49 57 45  ALKPHOS 61 61 69 61  BILITOT 0.4 0.6 0.6 0.7  PROT 6.4* 6.4* 7.0 7.2  ALBUMIN 2.2* 2.2* 2.4* 2.3*   No results for input(s): LIPASE, AMYLASE in the last 168 hours. No results for input(s): AMMONIA in the last 168 hours. CBC:  Recent Labs Lab 03/25/16 0300 03/26/16 0248 03/27/16 0218 03/28/16 0344 03/29/16 0331  WBC 12.0* 15.2* 16.4* 16.9* 19.4*  NEUTROABS 9.4*  --   --  13.5*  --   HGB 9.4* 10.7* 11.9* 11.8* 11.4*  HCT 30.2* 32.4* 35.0* 35.3* 34.3*  MCV 93.8 93.4 93.1 90.7 89.6  PLT 334 288 354 382 397   Cardiac Enzymes: No results for input(s): CKTOTAL, CKMB, CKMBINDEX, TROPONINI in the last 168 hours. BNP (last 3 results)  Recent Labs  03/19/16 0010  BNP 385.9*    ProBNP (last 3 results) No results for input(s): PROBNP in the last 8760 hours.  CBG:  Recent Labs Lab 03/30/16 2059 03/31/16 0023 03/31/16 0531 03/31/16 0806 03/31/16 1128  GLUCAP 224* 166* 126* 126* 254*    No results found for this or any previous visit (from the past 240 hour(s)).   Studies:  Recent x-ray studies have been reviewed in detail by the  Attending Physician  Scheduled Meds:  Scheduled Meds: . antiseptic oral rinse  7 mL Mouth Rinse BID  . apixaban  5 mg Oral BID  . clopidogrel  75 mg Per Tube Daily  . famotidine  20 mg Oral Daily  . feeding supplement (ENSURE ENLIVE)  237 mL Oral Q24H  . folic acid  1 mg Oral Daily  . furosemide  40 mg Oral BID  . gabapentin  100 mg Oral Daily  . insulin aspart  0-20 Units Subcutaneous 6 times per day  . insulin glargine  5 Units Subcutaneous Daily  . isosorbide-hydrALAZINE  1 tablet Oral Q8H  . levETIRAcetam  500 mg Oral Q12H  . losartan  50 mg Oral BID  . magnesium oxide  400 mg Oral BID  . metoprolol tartrate  50 mg Oral BID  . PARoxetine  10 mg Oral Daily  . potassium chloride  40 mEq Oral TID  . predniSONE  5 mg Oral Q breakfast  . topiramate  25 mg Oral Daily    Time spent on care of this patient: 35 mins  Verneita Griffes, MD [facility] [phone]

## 2016-03-31 NOTE — Progress Notes (Signed)
Patient has home CPAP unit and places themselves on.

## 2016-04-01 ENCOUNTER — Inpatient Hospital Stay (HOSPITAL_COMMUNITY): Payer: Medicare Other

## 2016-04-01 DIAGNOSIS — I5023 Acute on chronic systolic (congestive) heart failure: Secondary | ICD-10-CM

## 2016-04-01 LAB — COMPREHENSIVE METABOLIC PANEL
ALT: 42 U/L (ref 17–63)
ANION GAP: 11 (ref 5–15)
AST: 31 U/L (ref 15–41)
Albumin: 2.2 g/dL — ABNORMAL LOW (ref 3.5–5.0)
Alkaline Phosphatase: 69 U/L (ref 38–126)
BILIRUBIN TOTAL: 0.6 mg/dL (ref 0.3–1.2)
BUN: 22 mg/dL — ABNORMAL HIGH (ref 6–20)
CO2: 20 mmol/L — ABNORMAL LOW (ref 22–32)
Calcium: 9.7 mg/dL (ref 8.9–10.3)
Chloride: 103 mmol/L (ref 101–111)
Creatinine, Ser: 1.24 mg/dL (ref 0.61–1.24)
GFR, EST NON AFRICAN AMERICAN: 58 mL/min — AB (ref >60)
Glucose, Bld: 157 mg/dL — ABNORMAL HIGH (ref 65–99)
POTASSIUM: 4.3 mmol/L (ref 3.5–5.1)
Sodium: 134 mmol/L — ABNORMAL LOW (ref 135–145)
TOTAL PROTEIN: 7 g/dL (ref 6.5–8.1)

## 2016-04-01 LAB — GLUCOSE, CAPILLARY
GLUCOSE-CAPILLARY: 153 mg/dL — AB (ref 65–99)
GLUCOSE-CAPILLARY: 261 mg/dL — AB (ref 65–99)
Glucose-Capillary: 192 mg/dL — ABNORMAL HIGH (ref 65–99)
Glucose-Capillary: 260 mg/dL — ABNORMAL HIGH (ref 65–99)
Glucose-Capillary: 298 mg/dL — ABNORMAL HIGH (ref 65–99)

## 2016-04-01 LAB — BASIC METABOLIC PANEL
BUN: 22 mg/dL — AB (ref 4–21)
Creatinine: 1.2 mg/dL (ref 0.6–1.3)
Glucose: 157 mg/dL
Sodium: 134 mmol/L — AB (ref 137–147)

## 2016-04-01 LAB — MAGNESIUM: MAGNESIUM: 2.2 mg/dL (ref 1.7–2.4)

## 2016-04-01 LAB — HEPATIC FUNCTION PANEL: Bilirubin, Total: 0.6 mg/dL

## 2016-04-01 MED ORDER — APIXABAN 5 MG PO TABS: 5.0000 mg | ORAL_TABLET | Freq: Two times a day (BID) | ORAL | Status: AC

## 2016-04-01 MED ORDER — CAPSAICIN 0.075 % EX CREA: TOPICAL_CREAM | Freq: Two times a day (BID) | CUTANEOUS | Status: DC

## 2016-04-01 MED ORDER — TOPIRAMATE 25 MG PO TABS: 25.0000 mg | ORAL_TABLET | Freq: Every day | ORAL | Status: DC

## 2016-04-01 MED ORDER — POTASSIUM CHLORIDE CRYS ER 20 MEQ PO TBCR: 40.0000 meq | EXTENDED_RELEASE_TABLET | Freq: Three times a day (TID) | ORAL | Status: DC

## 2016-04-01 MED ORDER — CAPSAICIN 0.075 % EX CREA
TOPICAL_CREAM | Freq: Two times a day (BID) | CUTANEOUS | Status: DC
  Filled 2016-04-01: qty 60

## 2016-04-01 MED ORDER — FUROSEMIDE 10 MG/ML IJ SOLN
40.0000 mg | Freq: Once | INTRAMUSCULAR | Status: AC
  Administered 2016-04-01: 40 mg via INTRAVENOUS

## 2016-04-01 MED ORDER — CAPSICUM OLEORESIN 0.025 % EX CREA
TOPICAL_CREAM | Freq: Two times a day (BID) | CUTANEOUS | Status: DC
  Administered 2016-04-01: 11:00:00 via TOPICAL
  Administered 2016-04-01: 1 via TOPICAL
  Administered 2016-04-02: 10:00:00 via TOPICAL
  Filled 2016-04-01: qty 60

## 2016-04-01 MED ORDER — MAGNESIUM OXIDE 400 (241.3 MG) MG PO TABS: 400.0000 mg | ORAL_TABLET | Freq: Two times a day (BID) | ORAL | Status: DC

## 2016-04-01 MED ORDER — METOPROLOL TARTRATE 75 MG PO TABS: 75.0000 mg | ORAL_TABLET | Freq: Two times a day (BID) | ORAL | Status: DC

## 2016-04-01 MED ORDER — PAROXETINE HCL 10 MG PO TABS: 10.0000 mg | ORAL_TABLET | Freq: Every day | ORAL | Status: DC

## 2016-04-01 MED ORDER — FUROSEMIDE 40 MG PO TABS: 40.0000 mg | ORAL_TABLET | Freq: Two times a day (BID) | ORAL | Status: DC

## 2016-04-01 MED ORDER — LEVETIRACETAM 500 MG PO TABS: 500.0000 mg | ORAL_TABLET | Freq: Two times a day (BID) | ORAL | Status: DC

## 2016-04-01 MED ORDER — METOPROLOL TARTRATE 50 MG PO TABS
75.0000 mg | ORAL_TABLET | Freq: Two times a day (BID) | ORAL | Status: DC
  Administered 2016-04-01 – 2016-04-02 (×2): 75 mg via ORAL
  Filled 2016-04-01 (×2): qty 1

## 2016-04-01 MED ORDER — INSULIN ASPART 100 UNIT/ML ~~LOC~~ SOLN
0.0000 [IU] | Freq: Three times a day (TID) | SUBCUTANEOUS | Status: DC
  Administered 2016-04-01 (×3): 11 [IU] via SUBCUTANEOUS
  Administered 2016-04-02: 4 [IU] via SUBCUTANEOUS
  Administered 2016-04-02: 11 [IU] via SUBCUTANEOUS

## 2016-04-01 MED ORDER — ISOSORB DINITRATE-HYDRALAZINE 20-37.5 MG PO TABS: 1.0000 | ORAL_TABLET | Freq: Three times a day (TID) | ORAL | Status: DC

## 2016-04-01 NOTE — Progress Notes (Signed)
Subjective: Breathing is OK  NO CP   Objective: Filed Vitals:   03/31/16 0533 03/31/16 1203 03/31/16 2129 04/01/16 0441  BP: 144/87 137/73 120/68 116/71  Pulse: 94 88 89 90  Temp: 97.8 F (36.6 C) 97.3 F (36.3 C) 98.8 F (37.1 C) 98.7 F (37.1 C)  TempSrc: Oral Oral Oral Oral  Resp: 20 18 18 17   Height:    5\' 11"  (1.803 m)  Weight: 190 lb 9.6 oz (86.456 kg)   196 lb 6.9 oz (89.1 kg)  SpO2: 97% 100% 97% 98%   Weight change: 5 lb 13.3 oz (2.644 kg)  Intake/Output Summary (Last 24 hours) at 04/01/16 1143 Last data filed at 04/01/16 0900  Gross per 24 hour  Intake   2260 ml  Output    850 ml  Net   1410 ml    General: Alert, awake, oriented x3, in no acute distress Neck:  JVP is normal Heart: Irregular rate and rhythm, without murmurs, rubs, gallops.  Lungs:Rales at bases   Exemities:  Tr edema.   Neuro: Grossly intact, nonfocal.  Teel  AFib Rates 100s this am    Lab Results: Results for orders placed or performed during the hospital encounter of 03/18/16 (from the past 24 hour(s))  Glucose, capillary     Status: Abnormal   Collection Time: 03/31/16  4:51 PM  Result Value Ref Range   Glucose-Capillary 236 (H) 65 - 99 mg/dL  Glucose, capillary     Status: Abnormal   Collection Time: 03/31/16  9:35 PM  Result Value Ref Range   Glucose-Capillary 216 (H) 65 - 99 mg/dL  Glucose, capillary     Status: Abnormal   Collection Time: 04/01/16 12:19 AM  Result Value Ref Range   Glucose-Capillary 192 (H) 65 - 99 mg/dL  Comprehensive metabolic panel     Status: Abnormal   Collection Time: 04/01/16  4:08 AM  Result Value Ref Range   Sodium 134 (L) 135 - 145 mmol/L   Potassium 4.3 3.5 - 5.1 mmol/L   Chloride 103 101 - 111 mmol/L   CO2 20 (L) 22 - 32 mmol/L   Glucose, Bld 157 (H) 65 - 99 mg/dL   BUN 22 (H) 6 - 20 mg/dL   Creatinine, Ser 1.24 0.61 - 1.24 mg/dL   Calcium 9.7 8.9 - 10.3 mg/dL   Total Protein 7.0 6.5 - 8.1 g/dL   Albumin 2.2 (L) 3.5 - 5.0 g/dL   AST 31 15  - 41 U/L   ALT 42 17 - 63 U/L   Alkaline Phosphatase 69 38 - 126 U/L   Total Bilirubin 0.6 0.3 - 1.2 mg/dL   GFR calc non Af Amer 58 (L) >60 mL/min   GFR calc Af Amer >60 >60 mL/min   Anion gap 11 5 - 15  Magnesium     Status: None   Collection Time: 04/01/16  4:08 AM  Result Value Ref Range   Magnesium 2.2 1.7 - 2.4 mg/dL  Glucose, capillary     Status: Abnormal   Collection Time: 04/01/16  4:14 AM  Result Value Ref Range   Glucose-Capillary 153 (H) 65 - 99 mg/dL  Glucose, capillary     Status: Abnormal   Collection Time: 04/01/16 11:05 AM  Result Value Ref Range   Glucose-Capillary 260 (H) 65 - 99 mg/dL    Studies/Results: Dg Chest Port 1 View  04/01/2016  CLINICAL DATA:  Pneumonia, hypertension and diabetes. EXAM: PORTABLE CHEST 1 VIEW COMPARISON:  03/28/2016  and prior studies FINDINGS: Endotracheal tube and NGT to have been removed. Bilateral airspace opacities not significantly changed. There is no evidence of pneumothorax. No other significant changes noted. IMPRESSION: Support apparatus removal as described. Little significant change in bilateral airspace opacities. Electronically Signed   By: Margarette Canada M.D.   On: 04/01/2016 08:46    Medications:Atrial fib    @PROBHOSP @  1  Afib  PErsistent  WIll increase metoprolol to 75 bid  Continue Eliquis  2.  Acute on chronic systolic CHF  VOlume is up mildly  WIll give addional IV lasix today    3.  CAD   Triv trop bump  Pt was followed in [place] and at [facility]  Wife is here today  Says they now live in [place]  May want to be followed here    WIth histoyr of signif interventions in past they need to reflect  WIll net plan on cath now  Not on ASA  On plavix  4  HL  Intolerant to statins    LOS: 14 days   Dorris Carnes 04/01/2016, 11:43 AM

## 2016-04-01 NOTE — Progress Notes (Addendum)
Julian Ross [id number] DOB: [birth year] DOA: 03/18/2016 PCP: No PCP Per Patient  Admit HPI / Brief Narrative: 70 year old male PMHx Substance Abuse (polypharmacy), partial blindness HTN, Chronic Systolic CHF HLD, DM Type 2, MI, CAD native artery  s/p PCI;   Admitted to [facility] admit 03/18/2016 to week subacute worsening of a slightly more remote shortness of breath history Endorsed on admission chest tightness like someone was squeezing his chest.    admission 102.49F, heart rate 151, blood pressure 69/49, O2 sats 89%. Chest x-ray and seen have bilateral infiltrates suggestive of pneumonia.  Lab work reveals WBC 16.7, lactic acid 4.52, creatinine 2.1 BUN 21.  Patient noted weight 2 weeks PTA more oriented was 220 pounds On admission weighed at 240 pounds.  He was ultimately admitted and critical care consulted on him because of metabolic encephalopathy, atrial fibrillation He was intubated again 3/24 His RA SS was kept at -1 to -3 for vent synchrony  HPI/Subjective:   more oriented   recognizes family members friends still a little bit of confusion here and there  Assessment/Plan: Acute Respiratory Failure with Hypoxia /HCAP/Severe Sepsis -Complete 10 days of aztreonam per PCCM -Flutter valve -Out of bed to chair/ambulate -PT; recommends SNF when more orietned  OSA - on CPAP,  -CPAP per respiratory  Chronic systolic CHF (LVEF XX123456 cardiomyopathy -Lasix 40 mg  BID -Metoprolol 37.5 mg bid-->50 twice a day per cardiology 03/30/2016 -Cozaar 50 mg bid  -BIDIL 20-30 7.5 mg bid -Strict in and out since admission -4.3 L so far since admission -Daily weight has dropped from 208-->202-->197-->190- cardiology giving 1 dose of IV Lasix 04/01/2016 and reassess volume status and --196 pounds -No cardiac cath  New onset A fib [place] score >3  - improved control -Currently in A. fib but rate controlled. -See chronic systolic CHF -Eliquis started this admission-does  also need Plavix-see discussion  Elevated troponin/Demand ischemia  -Secondary to A. fib with RVR new-onset  Hx MI, CAD s/p PCI - reportedly has 16 stents per wife -Currently not a factor -Continue plavix 75 daily  Acute on CKD STAGE III -Continue monitor closely -Ser Cr trending from 22/2.09-->16/1.17 --->22.1.24 ----04/01/16  Hypokalemia -Potassium goal> 4 -K Dur 40 mEq 3 times a day as of 3/30 -Continue magnesium as below  Hypomagnesemia  -Magnesium goal> 2 -Magnesium 03/29/16 = 1.9. Replace orally Mag-Ox 400 daily-->bid 3/30  Anemia of chronic disease? -Anemia panel  showed iron level  39, saturation ratio 15 - anemia is not microcytic however -Transfuse for hemoglobin<8  RA - on plaquenil / prednisone 5mg  QD -Hold home plaquenil -Restart prednisone 5 mg daily (home dose)  -needs to follow-up with rheumatologist as an outpatient  DM Type II uncontrolled/Diabetic Neuropathy  -3/19 Hemoglobin A1c = 9.2    -Lantus 8 mg - CBG range = 153--260  Recurrent tremors/Essential Tremor -> myoclonus per neurology, resolved -Essential tremor not evident on exam  Anxiety  , chronic pain, polypharmacy Acute encephalopathy  - uses xanax at home, family reports polypharmacy -Patient now 10 days out, should be through withdrawal  Period. -Avoid benzodiazepine  -taper Gabapentin 300 tid-->100 bid-->daily 03/30/2016, Fentanyl from 12.5-25-->12.5--discontinued 03/30/2016, lorazepam  2 mg every 4-1 mg every 4, Keppra 1000 every 12-->500 twice a day 03/30/2016, Topamax 25 twice a day-->25 daily 03/30/2016  we'll continue to taper and delineate his medications as appropriate He is slowly improving  Partial Blindness    Code Status: Partial Family Communication:  long discussion with son 03/28/16 Disposition Plan: SNF   >  35 minutes  Consultants: [facility] Dr.Daniel Lily Kocher [facility]   Procedure/Significant Events:  CT Head 3/19 >> no acute process, chronic  atrophy & small vessl disease 3/20 echocardiogram;- Left ventricle: mild LVH. -LVEF  30% to 35%. Global hypokinesis with severe  inferior hypokinesis to akinesis. - Pulmonary arteries: PA peak pressure: 35 mm Hg (S). 3/20 RLE Doppler; negative DVT.-Complicated mixed cystic area noted in the right popliteal fossa, Atypical baker's cyst? EEG 3/21 > slowing but no seizure EEG 3/22> no seizure 3/24 CT head > chronic microvascular change, nothing acute 3/24 CXR > worsenign bilateral airspace disease   Culture Blood cx 3/19 >> RVP 3/19 >> neg Flu 3/19 >> neg   Antibiotics: Vanc 3/19 >> 3/22 Aztreonam 3/19 >>  Levaquin 3/19 >> 3/22  DVT prophylaxis: Lovenox   Devices    LINES / TUBES:  ETT 3/20 > 3/23>>>3/26 Foley 3/20    Continuous Infusions:    Objective: VITAL SIGNS: Temp: 98 F (36.7 C) (04/02 1154) Temp Source: Oral (04/02 1154) BP: 155/82 mmHg (04/02 1154) Pulse Rate: 109 (04/02 1154) SPO2; FIO2:   Intake/Output Summary (Last 24 hours) at 04/01/16 1233 Last data filed at 04/01/16 1153  Gross per 24 hour  Intake   2260 ml  Output   1150 ml  Net   1110 ml     Exam: General:A/O x 3 (does not know where) clearly   Eyes: Negative headache, negative scleral hemorrhage ENT: Negative Runny nose, negative gingival bleeding, Neck:  Negative scars, masses, torticollis, lymphadenopathy, JVD Lungs: Clear to auscultation bilaterally without wheezes or crackles Cardiovascular: Irregular irregular rhythm and rate, negative murmur gallop or rub normal S1 and S2 Moving all 4 limbs equally    Data Reviewed: Basic Metabolic Panel:  Recent Labs Lab 03/27/16 0218 03/28/16 0344 03/29/16 0331 03/29/16 0917 03/30/16 0444 03/31/16 0348 04/01/16 0408  NA 142 142 141  --  141 140 134*  K 3.6 3.1* 2.7*  --  3.9 3.8 4.3  CL 108 108 107  --  110 110 103  CO2 20* 21* 22  --  23 20* 20*  GLUCOSE 137* 166* 66  --  118* 131* 157*  BUN 29* 21* 19  --  16 17 22*    CREATININE 1.01 1.08 1.17  --  1.17 1.13 1.24  CALCIUM 9.2 9.2 9.1  --  9.6 9.5 9.7  MG 1.9 2.1 2.1 1.9 2.2 2.1 2.2  PHOS 3.2  --   --   --   --   --   --    Liver Function Tests:  Recent Labs Lab 03/28/16 0344 03/29/16 0331 03/30/16 0444 03/31/16 0348 04/01/16 0408  AST 46* 53* 48* 32 31  ALT 39 49 57 45 42  ALKPHOS 61 61 69 61 69  BILITOT 0.4 0.6 0.6 0.7 0.6  PROT 6.4* 6.4* 7.0 7.2 7.0  ALBUMIN 2.2* 2.2* 2.4* 2.3* 2.2*   No results for input(s): LIPASE, AMYLASE in the last 168 hours. No results for input(s): AMMONIA in the last 168 hours. CBC:  Recent Labs Lab 03/26/16 0248 03/27/16 0218 03/28/16 0344 03/29/16 0331  WBC 15.2* 16.4* 16.9* 19.4*  NEUTROABS  --   --  13.5*  --   HGB 10.7* 11.9* 11.8* 11.4*  HCT 32.4* 35.0* 35.3* 34.3*  MCV 93.4 93.1 90.7 89.6  PLT 288 354 382 397   Cardiac Enzymes: No results for input(s): CKTOTAL, CKMB, CKMBINDEX, TROPONINI in the last 168 hours. BNP (last 3  results)  Recent Labs  03/19/16 0010  BNP 385.9*    ProBNP (last 3 results) No results for input(s): PROBNP in the last 8760 hours.  CBG:  Recent Labs Lab 03/31/16 1651 03/31/16 2135 04/01/16 0019 04/01/16 0414 04/01/16 1105  GLUCAP 236* 216* 192* 153* 260*    No results found for this or any previous visit (from the past 240 hour(s)).   Studies:  Recent x-ray studies have been reviewed in detail by the Attending Physician  Scheduled Meds:  Scheduled Meds: . antiseptic oral rinse  7 mL Mouth Rinse BID  . apixaban  5 mg Oral BID  . capsicum oleoresin   Topical BID  . clopidogrel  75 mg Per Tube Daily  . famotidine  20 mg Oral Daily  . feeding supplement (ENSURE ENLIVE)  237 mL Oral Q24H  . folic acid  1 mg Oral Daily  . furosemide  40 mg Intravenous Once  . furosemide  40 mg Oral BID  . gabapentin  100 mg Oral Daily  . insulin aspart  0-20 Units Subcutaneous TID WC & HS  . insulin glargine  5 Units Subcutaneous Daily  . isosorbide-hydrALAZINE  1  tablet Oral Q8H  . levETIRAcetam  500 mg Oral Q12H  . losartan  50 mg Oral BID  . magnesium oxide  400 mg Oral BID  . metoprolol tartrate  75 mg Oral BID  . PARoxetine  10 mg Oral Daily  . potassium chloride  40 mEq Oral TID  . predniSONE  5 mg Oral Q breakfast  . topiramate  25 mg Oral Daily    Time spent on care of this patient: 35 mins Long discussion with family  Verneita Griffes, MD [facility] [phone]

## 2016-04-02 DIAGNOSIS — G8929 Other chronic pain: Secondary | ICD-10-CM | POA: Diagnosis not present

## 2016-04-02 DIAGNOSIS — I255 Ischemic cardiomyopathy: Secondary | ICD-10-CM | POA: Diagnosis not present

## 2016-04-02 DIAGNOSIS — I5022 Chronic systolic (congestive) heart failure: Secondary | ICD-10-CM | POA: Diagnosis not present

## 2016-04-02 DIAGNOSIS — R079 Chest pain, unspecified: Secondary | ICD-10-CM | POA: Diagnosis not present

## 2016-04-02 DIAGNOSIS — M0609 Rheumatoid arthritis without rheumatoid factor, multiple sites: Secondary | ICD-10-CM | POA: Diagnosis not present

## 2016-04-02 DIAGNOSIS — R6521 Severe sepsis with septic shock: Secondary | ICD-10-CM | POA: Diagnosis not present

## 2016-04-02 DIAGNOSIS — I4891 Unspecified atrial fibrillation: Secondary | ICD-10-CM | POA: Diagnosis not present

## 2016-04-02 DIAGNOSIS — I252 Old myocardial infarction: Secondary | ICD-10-CM | POA: Diagnosis not present

## 2016-04-02 DIAGNOSIS — J9601 Acute respiratory failure with hypoxia: Secondary | ICD-10-CM | POA: Diagnosis not present

## 2016-04-02 DIAGNOSIS — M6281 Muscle weakness (generalized): Secondary | ICD-10-CM | POA: Diagnosis not present

## 2016-04-02 DIAGNOSIS — R5381 Other malaise: Secondary | ICD-10-CM | POA: Diagnosis not present

## 2016-04-02 DIAGNOSIS — G40909 Epilepsy, unspecified, not intractable, without status epilepticus: Secondary | ICD-10-CM | POA: Diagnosis not present

## 2016-04-02 DIAGNOSIS — A419 Sepsis, unspecified organism: Secondary | ICD-10-CM | POA: Diagnosis not present

## 2016-04-02 DIAGNOSIS — E119 Type 2 diabetes mellitus without complications: Secondary | ICD-10-CM | POA: Diagnosis not present

## 2016-04-02 DIAGNOSIS — F411 Generalized anxiety disorder: Secondary | ICD-10-CM | POA: Diagnosis not present

## 2016-04-02 DIAGNOSIS — J189 Pneumonia, unspecified organism: Secondary | ICD-10-CM | POA: Diagnosis not present

## 2016-04-02 DIAGNOSIS — G4733 Obstructive sleep apnea (adult) (pediatric): Secondary | ICD-10-CM | POA: Diagnosis not present

## 2016-04-02 DIAGNOSIS — I2510 Atherosclerotic heart disease of native coronary artery without angina pectoris: Secondary | ICD-10-CM | POA: Diagnosis not present

## 2016-04-02 DIAGNOSIS — N183 Chronic kidney disease, stage 3 (moderate): Secondary | ICD-10-CM | POA: Diagnosis not present

## 2016-04-02 DIAGNOSIS — Z5189 Encounter for other specified aftercare: Secondary | ICD-10-CM | POA: Diagnosis not present

## 2016-04-02 DIAGNOSIS — R2681 Unsteadiness on feet: Secondary | ICD-10-CM | POA: Diagnosis not present

## 2016-04-02 DIAGNOSIS — I5042 Chronic combined systolic (congestive) and diastolic (congestive) heart failure: Secondary | ICD-10-CM | POA: Diagnosis not present

## 2016-04-02 DIAGNOSIS — F329 Major depressive disorder, single episode, unspecified: Secondary | ICD-10-CM | POA: Diagnosis not present

## 2016-04-02 DIAGNOSIS — D638 Anemia in other chronic diseases classified elsewhere: Secondary | ICD-10-CM | POA: Diagnosis not present

## 2016-04-02 DIAGNOSIS — E876 Hypokalemia: Secondary | ICD-10-CM | POA: Diagnosis not present

## 2016-04-02 DIAGNOSIS — K5901 Slow transit constipation: Secondary | ICD-10-CM | POA: Diagnosis not present

## 2016-04-02 LAB — GLUCOSE, CAPILLARY
GLUCOSE-CAPILLARY: 172 mg/dL — AB (ref 65–99)
GLUCOSE-CAPILLARY: 288 mg/dL — AB (ref 65–99)

## 2016-04-02 LAB — MAGNESIUM: MAGNESIUM: 2.3 mg/dL (ref 1.7–2.4)

## 2016-04-02 LAB — AMMONIA: AMMONIA: 29 umol/L (ref 9–35)

## 2016-04-02 MED ORDER — INSULIN GLARGINE 100 UNIT/ML ~~LOC~~ SOLN: 5.0000 [IU] | Freq: Every day | SUBCUTANEOUS | Status: DC

## 2016-04-02 MED ORDER — ASPIRIN EC 81 MG PO TBEC: 81.0000 mg | DELAYED_RELEASE_TABLET | Freq: Every day | ORAL | Status: DC

## 2016-04-02 MED ORDER — PAROXETINE HCL 10 MG PO TABS: 10.0000 mg | ORAL_TABLET | Freq: Every day | ORAL | Status: DC

## 2016-04-02 MED ORDER — GABAPENTIN 100 MG PO CAPS: 100.0000 mg | ORAL_CAPSULE | Freq: Every day | ORAL | Status: DC

## 2016-04-02 NOTE — Discharge Summary (Signed)
Physician Discharge Summary  Julian Ross [id number] DOB: 06-24-1946 DOA: 03/18/2016  PCP: No PCP Per Patient  Admit date: 03/18/2016 Discharge date: 04/02/2016  Time spent: 50 minutes  Recommendations for Outpatient Follow-up:  1. Patient would benefit from cardiology follow-up in [facility] if decides to continue to seek care in this area-otherwise should follow up at [facility], Dr. Joaquim Nam  2. Polypharmacy has been limited this admission-patient will need minimal medications for back pain as well as medications for neuropathy given toxic metabolic encephalopathy that is slowly but steadily improving 3. Patient will discharge to [facility] assisted living and will get home health orders to assist in regaining strength 4. Cardiology as an outpatient either here or at [facility] to determine whether there is any benefit to both Plavix as well as Eliquis-->patient has a history of reported? 16 stents placed at [facility]?-Patient came into the hospital on this admission with chronic atrial fibrillation and will need Eliquis because of Chad> 3.  -see below discussion 5. Patient will benefit from Chem-12, magnesium, CBC in about one week at facility 6. Patient should seek help for chronic low back pain with pain management as an outpatient-he has a history of L4-L5 lumbar surgery in the past 7. Please note carefully changes to [facility] from prior hospitalization as multiple medications have been delineated and narrowed or discontinued   Discharge Diagnoses:  Principal Problem:   Septic shock ([place]) Active Problems:   CKD (chronic kidney disease), stage III   Edema leg   Diabetes mellitus type 2 in obese (HCC)   Elevated troponin   Anemia   Acute hypoxemic respiratory failure (HCC)   Altered mental status   Atrial fibrillation, new onset (HCC)   Acute on chronic systolic and diastolic heart failure, NYHA class 1 (HCC)   Acute respiratory failure with hypoxemia (HCC)   AKI (acute  kidney injury) ([place])   Acute respiratory failure with hypoxia (HCC)   Chronic systolic CHF (congestive heart failure) (HCC)   Cardiomyopathy, ischemic   New onset atrial fibrillation ([place])   Demand ischemia ([place])   Acute renal failure superimposed on stage 3 chronic kidney disease ([place])   Hypokalemia   Rheumatoid arthritis of multiple sites with negative rheumatoid factor ([place])   Acute encephalopathy   Anxiety state   Discharge Condition: better  Diet recommendation:  hh low salt  Filed Weights   03/31/16 0533 04/01/16 0441 04/02/16 [id number]  Weight: 86.456 kg (190 lb 9.6 oz) 89.1 kg (196 lb 6.9 oz) 87.59 kg (193 lb 1.6 oz)    History of present illness:  Admit HPI / Brief Narrative: 70 year old male PMHx Substance Abuse (polypharmacy), partial blindness HTN, Chronic Systolic CHF HLD, DM Type 2, MI, CAD native artery s/p PCI;   Admitted to [facility] admit 03/18/2016 to week subacute worsening of a slightly more remote shortness of breath history Endorsed on admission chest tightness like someone was squeezing his chest.   admission 102.11F, heart rate 151, blood pressure 69/49, O2 sats 89%. Chest x-ray and seen have bilateral infiltrates suggestive of pneumonia.  Lab work reveals WBC 16.7, lactic acid 4.52, creatinine 2.1 BUN 21.  Patient noted weight 2 weeks PTA more oriented was 220 pounds On admission weighed at 240 pounds.  He was ultimately admitted and critical care consulted on him because of metabolic encephalopathy, atrial fibrillation He was intubated again 3/24 His RA SS was kept at -1 to -3 for vent synchrony   Ultimately transferred out of ICU and noted toxic metabolic  encephalopathy probably multifactorial but mainly secondary to post ICU delirium, medication use in terms of seizure prophylaxis which was used on admission and this delirium started to clear over the course of/1-/217-see below for full details.  Hospital Course:   Acute Respiratory Failure  with Hypoxia /HCAP/Severe Sepsis -Complete 10 days of aztreonam per PCCM -Flutter valve -Out of bed - SNF   OSA - on CPAP,  -CPAP per respiratory  Chronic systolic CHF (LVEF [id number] cardiomyopathy -Lasix 40 mgBID -Metoprolol 37.5 mg bid-->50 twice a day per cardiology 03/30/2016 -Cozaar 50 mg bid  -BIDIL 20-30 7.5 mg bid -Strict in and out since admission -4.3 L so far since admission -Daily weight has dropped from 208-->202-->197-->190- cardiology giving 1 dose of IV Lasix 04/01/2016 and reassess volume status and --196 pounds -No cardiac cath right now -defer to Dr. Rolan Lipa as OP in [place] onset A fib [place] score >3 - improved control -Currently in A. fib but rate controlled. -See chronic systolic CHF -Eliquis started this admission-does also need Plavix??-I discussed with his primary cardiologist who felt that patient has a tight occluded RCA but that if he has not had an intervention in the recent year then he could benefit from transition from Plavix to aspirin in addition to Eliquis for atrial fibrillation. I have faxed the discharge summary to him at [phone].  Elevated troponin/Demand ischemia  -Secondary to A. fib with RVR new-onset  Hx MI, CAD s/p PCI - reportedly has 16 stents per wife -Currently not a factor -Transitioned this admission to aspirin 81 mg from plavix 75 daily  Acute on CKD STAGE III -Continue monitor closely -Ser Cr trending from 22/2.09-->16/1.17 --->22.1.24 ----04/01/16  Hypokalemia -Potassium goal> 4 -K Dur 40 mEq 3 times a day as of 3/30 -Continue magnesium as below -Needs basic metabolic panel as an outpatient  Hypomagnesemia  -Magnesium goal> 2 -Magnesium 03/29/16 = 1.9. Replace orally Mag-Ox 400 daily-->bid 3/30  Anemia of chronic disease? -Anemia panel showed iron level 39, saturation ratio 15 - anemia is not microcytic however -Transfuse for hemoglobin<8  RA - on plaquenil / prednisone 5mg  QD -Hold home  plaquenil -Restart prednisone 5 mg daily (home dose)  -needs to follow-up with rheumatologist as an outpatient -Continue leucovorin as well as methotrexate as per Dr. Scarlette Shorts of rheumatology in [place]  DM Type II uncontrolled/Diabetic Neuropathy  -3/19 Hemoglobin A1c = 9.2   -Lantus 8 mg - CBG range = 153--260  Recurrent tremors/Essential Tremor -> myoclonus per neurology, resolved -Essential tremor not evident on exam  Anxiety , chronic pain, polypharmacy Acute encephalopathy  - uses xanax at home, family reports polypharmacy -Patient now 10 days out, should be through withdrawal -Avoid benzodiazepine  -taper Gabapentin 300 tid-->100 bid-->daily 03/30/2016,  Fentanyl from 12.5-25-->12.5--discontinued 03/30/2016, lorazepam 2 mg every 4-1 mg every 4-->off on d/c to SNF Keppra 1000 every 12-->500 twice a day 03/30/2016,  Topamax 25 twice a day-->25 daily 03/30/2016--off on d/c He is slowly improving Cardiology to follow as OP  Consultations:  Cardiology  Assumed care from PCCM  Discharge Exam: Filed Vitals:   04/01/16 2248 04/02/16 0638  BP: 118/70 114/76  Pulse: 94 90  Temp: 97.9 F (36.6 C) 97.7 F (36.5 C)  Resp: 18 20    General: eomi ncat less confused, ambulated to doorway with minimal assit Cardiovascular: s1 s2 no m/r/g Respiratory: clear no added sound, no rales no rhonchi  Discharge Instructions   Discharge Instructions    Diet - low sodium heart healthy  Complete by:  As directed      Discharge instructions    Complete by:  As directed   See [facility] for significant medical changes     Increase activity slowly    Complete by:  As directed           Current Discharge Medication List    START taking these medications   Details  apixaban (ELIQUIS) 5 MG TABS tablet Take 1 tablet (5 mg total) by mouth 2 (two) times daily. Qty: 60 tablet    capsicum (ZOSTRIX) 0.075 % topical cream Apply topically 2 (two) times daily. Qty: 28.3 g, Refills:  0    insulin glargine (LANTUS) 100 UNIT/ML injection Inject 0.05 mLs (5 Units total) into the skin daily. Qty: 10 mL, Refills: 11    isosorbide-hydrALAZINE (BIDIL) 20-37.5 MG tablet Take 1 tablet by mouth every 8 (eight) hours.    levETIRAcetam (KEPPRA) 500 MG tablet Take 1 tablet (500 mg total) by mouth every 12 (twelve) hours. Qty: 60 tablet, Refills: 0    magnesium oxide (MAG-OX) 400 (241.3 Mg) MG tablet Take 1 tablet (400 mg total) by mouth 2 (two) times daily. Qty: 60 tablet, Refills: 0    metoprolol 75 MG TABS Take 75 mg by mouth 2 (two) times daily.      CONTINUE these medications which have CHANGED   Details  furosemide (LASIX) 40 MG tablet Take 1 tablet (40 mg total) by mouth 2 (two) times daily. Qty: 30 tablet    gabapentin (NEURONTIN) 100 MG capsule Take 1 capsule (100 mg total) by mouth daily. Qty: 30 capsule, Refills: 0    PARoxetine (PAXIL) 10 MG tablet Take 1 tablet (10 mg total) by mouth daily. Qty: 30 tablet, Refills: 0    potassium chloride SA (K-DUR,KLOR-CON) 20 MEQ tablet Take 2 tablets (40 mEq total) by mouth 3 (three) times daily.      CONTINUE these medications which have NOT CHANGED   Details  BEE POLLEN PO Take 1 capsule by mouth daily.    folic acid (FOLVITE) 1 MG tablet Take 1 mg by mouth daily.    insulin lispro protamine-lispro (HUMALOG 50/50 MIX) (50-50) 100 UNIT/ML SUSP injection Inject 40-50 Units into the skin 3 (three) times daily with meals. Inject 50 units subcutaneously with breakfast and 40 units with lunch & supper    leucovorin (WELLCOVORIN) 10 MG tablet Take 10 mg by mouth See admin instructions. Take 1 tablet (10 mg) by mouth every 12 hours and 24 hours after the methotrexate dose    losartan (COZAAR) 50 MG tablet Take 50 mg by mouth daily.    Methotrexate Sodium (METHOTREXATE, PF,) 50 MG/2ML injection Inject 15 mg into the muscle once a week. Inject 0.6 ml (15 mg) - on Fridays Refills: 0    Misc Natural Products (OSTEO BI-FLEX  ADV DOUBLE ST PO) Take 1 tablet by mouth 2 (two) times daily.    predniSONE (DELTASONE) 5 MG tablet Take 5 mg by mouth daily with breakfast.    PRESCRIPTION MEDICATION Inhale into the lungs See admin instructions. Use whenever sleeping    tamsulosin (FLOMAX) 0.4 MG CAPS capsule Take 0.4 mg by mouth daily.  Refills: 4    bimatoprost (LUMIGAN) 0.01 % SOLN Place 1 drop into both eyes at bedtime.      STOP taking these medications     ALPRAZolam (NIRAVAM) 0.5 MG dissolvable tablet      aspirin EC 81 MG tablet      Cholecalciferol (VITAMIN D3)  5000 units CAPS      clopidogrel (PLAVIX) 75 MG tablet      Doxylamine Succinate, Sleep, (UNISOM PO)      HYDROcodone-acetaminophen (NORCO/VICODIN) 5-325 MG tablet      omega-3 acid ethyl esters (LOVAZA) 1 g capsule      OVER THE COUNTER MEDICATION      OVER THE COUNTER MEDICATION      tiZANidine (ZANAFLEX) 4 MG tablet      topiramate (TOPAMAX) 25 MG tablet      traMADol (ULTRAM) 50 MG tablet        Allergies  Allergen Reactions  . Ace Inhibitors Cough  . Atenolol Other (See Comments)    Unknown reaction  . Contrast Media [Iodinated Diagnostic Agents] Rash  . Penicillins Hives and Rash    Has patient had a PCN reaction causing immediate rash, facial/tongue/throat swelling, SOB or lightheadedness with hypotension: Yes Has patient had a PCN reaction causing severe rash involving mucus membranes or skin necrosis: No Has patient had a PCN reaction that required hospitalization pt was in the hospital at time of last reaction - heart attack Has patient had a PCN reaction occurring within the last 10 years: No If all of the above answers are "NO", then may proceed with Cephalosporin use.      The results of significant diagnostics from this hospitalization (including imaging, microbiology, ancillary and laboratory) are listed below for reference.    Significant Diagnostic Studies: Ct Head Wo Contrast  03/23/2016  CLINICAL DATA:   Increasing lethargy after extubation. EXAM: CT HEAD WITHOUT CONTRAST TECHNIQUE: Contiguous axial images were obtained from the base of the skull through the vertex without intravenous contrast. COMPARISON:  03/18/2016. FINDINGS: No evidence for acute infarction, hemorrhage, mass lesion, hydrocephalus, or extra-axial fluid. Global atrophy. Chronic microvascular ischemic change. No osseous lesion. Vascular calcification. Stable changes of probable chronic sinus disease. Unchanged appearance from priors. IMPRESSION: Stable chronic changes as described. No findings suggestive of acute cerebral infarction or hemorrhage. Electronically Signed   By: Staci Righter M.D.   On: 03/23/2016 14:54   Ct Head Wo Contrast  03/18/2016  CLINICAL DATA:  STROKE -LIKE SYMPTOMS-PLEASE EVALUATE FOR STROKE. Time course is not indicated. EXAM: CT HEAD WITHOUT CONTRAST TECHNIQUE: Contiguous axial images were obtained from the base of the skull through the vertex without intravenous contrast. COMPARISON:  None. FINDINGS: Diffuse cerebral atrophy. Ventricular dilatation consistent with central atrophy. Patchy areas of low-attenuation change in the deep white matter consistent with small vessel ischemia. No mass effect or midline shift. No abnormal extra-axial fluid collections. Gray-white matter junctions are distinct. Basal cisterns are not effaced. No evidence of acute intracranial hemorrhage. No depressed skull fractures. Mucosal thickening throughout the left maxillary antrum, left ethmoid air cells, left frontal, and left sphenoid sinuses. Minimal mucosal thickening in the right paranasal sinuses. Visualized mastoid air cells are not opacified. Vascular calcifications. IMPRESSION: No acute intracranial abnormalities. Chronic atrophy and small vessel ischemic changes. Electronically Signed   By: Lucienne Capers M.D.   On: 03/18/2016 18:19   Dg Chest Port 1 View  04/01/2016  CLINICAL DATA:  Pneumonia, hypertension and diabetes. EXAM:  PORTABLE CHEST 1 VIEW COMPARISON:  03/28/2016 and prior studies FINDINGS: Endotracheal tube and NGT to have been removed. Bilateral airspace opacities not significantly changed. There is no evidence of pneumothorax. No other significant changes noted. IMPRESSION: Support apparatus removal as described. Little significant change in bilateral airspace opacities. Electronically Signed   By: Margarette Canada M.D.   On:  04/01/2016 08:46   Dg Chest Port 1 View  03/28/2016  CLINICAL DATA:  Pneumonia, hypertension, diabetes mellitus EXAM: PORTABLE CHEST 1 VIEW COMPARISON:  Portable exam 1823 hours compared to 03/25/2016 FINDINGS: Slight rotation to the LEFT. Normal heart size, mediastinal contours and pulmonary vascularity for technique. Coronary arterial stent noted. Infiltrates identified in the periphery of the mid to lower LEFT lung question pneumonia. Minimal RIGHT basilar atelectasis. No definite pleural effusion, pneumothorax or acute osseous findings. IMPRESSION: Persistent infiltrate LEFT lung. Electronically Signed   By: Lavonia Dana M.D.   On: 03/28/2016 18:45   Dg Chest Port 1 View  03/25/2016  CLINICAL DATA:  Acute respiratory failure, hypoxemia EXAM: PORTABLE CHEST 1 VIEW COMPARISON:  03/24/2016 FINDINGS: Cardiomediastinal silhouette is stable. Endotracheal tube and NG tube are unchanged in position. Persistent bilateral patchy airspace opacities left greater than right without significant change in aeration from prior exam. IMPRESSION: Stable support apparatus. No convincing pulmonary edema. Persistent bilateral patchy airspace opacities left greater than right. Electronically Signed   By: Lahoma Crocker M.D.   On: 03/25/2016 08:58   Dg Chest Port 1 View  03/24/2016  CLINICAL DATA:  Acute respiratory failure with hypoxemia. EXAM: PORTABLE CHEST 1 VIEW COMPARISON:  03/23/2016. FINDINGS: Endotracheal tube 3.5 cm above carina. Orogastric tube in the stomach. Slightly improved lung volumes with persistent  BILATERAL patchy opacities representing possible pneumonia, edema, or ARDS. IMPRESSION: No active disease. Electronically Signed   By: Staci Righter M.D.   On: 03/24/2016 09:33   Dg Chest Port 1 View  03/23/2016  CLINICAL DATA:  Acute respiratory failure EXAM: PORTABLE CHEST 1 VIEW COMPARISON:  03/23/2016 FINDINGS: Endotracheal tube and nasogastric catheter are noted in satisfactory position. The cardiac shadow is stable. Lungs are again well aerated with bilateral airspace opacities stable from the previous exam. No new focal abnormality is noted. IMPRESSION: No change from the prior exam with the exception of nasogastric catheter placement Electronically Signed   By: Inez Catalina M.D.   On: 03/23/2016 07:30   Dg Chest Port 1 View  03/23/2016  CLINICAL DATA:  Endotracheal tube placement EXAM: PORTABLE CHEST 1 VIEW COMPARISON:  03/21/2016 FINDINGS: Endotracheal tube with tip measuring 4.7 cm above the carina. Shallow inspiration. Borderline heart size. Diffuse patchy airspace infiltrates throughout both lungs demonstrate progression since previous study. This may represent pneumonia, edema, or ARDS. Probable small bilateral pleural effusions. No pneumothorax. IMPRESSION: Endotracheal tube tip measures 4.7 cm above the carina. Progression of diffuse bilateral airspace infiltrates in the lungs. Electronically Signed   By: Lucienne Capers M.D.   On: 03/23/2016 01:21   Dg Chest Port 1 View  03/21/2016  CLINICAL DATA:  Respiratory failure.  Hypoxia. EXAM: PORTABLE CHEST 1 VIEW COMPARISON:  Single-view of the chest 03/18/2016, 03/19/2016 and 03/20/2016. FINDINGS: ET tube and NG tube remain in place. Bilateral airspace disease is worse on the left but is improved. There is no pneumothorax or pleural effusion. Heart size is normal. IMPRESSION: Improved left worse than right airspace disease. No new abnormality. Electronically Signed   By: Inge Rise M.D.   On: 03/21/2016 07:17   Dg Chest Port 1  View  03/20/2016  CLINICAL DATA:  Acute respiratory failure with hypoxemia. EXAM: PORTABLE CHEST 1 VIEW COMPARISON:  Chest x-rays dated 03/19/2016 and 03/18/2016. FINDINGS: Endotracheal tube remains adequately positioned with tip just above the level of the carina. Enteric tube passes below the diaphragm. Cardiomediastinal silhouette appears stable in size and configuration. There is persistent central pulmonary vascular  congestion and patchy bilateral airspace opacities which are most suggestive of pulmonary edema pattern. Perhaps mildly improved aeration within the left lung. No pneumothorax seen. IMPRESSION: Patchy bilateral airspace opacities, multifocal pneumonia versus pulmonary edema, not significantly changed in overall extent, perhaps slightly improved aeration within the left lung. Favor pulmonary edema. ARDS is another consideration. Electronically Signed   By: Franki Cabot M.D.   On: 03/20/2016 13:20   Dg Chest Port 1 View  03/19/2016  CLINICAL DATA:  Respiratory failure EXAM: PORTABLE CHEST 1 VIEW COMPARISON:  03/19/2016 FINDINGS: The endotracheal tube is 5.5 cm above the carina. Multifocal consolidation persists bilaterally, probably not significantly changed. No pneumothorax. IMPRESSION: Satisfactorily positioned ETT. No significant interval change in the bilateral airspace opacities. Electronically Signed   By: Andreas Newport M.D.   On: 03/19/2016 06:28   Dg Chest Port 1 View  03/19/2016  CLINICAL DATA:  Dyspnea for 2 months, worsened over the past 2 days. EXAM: PORTABLE CHEST 1 VIEW COMPARISON:  03/18/2016 FINDINGS: Multifocal consolidation has mildly worsened from 03/18/2016, now with more confluent opacity in the central right lung and in the left upper lobe periphery. There may be a left pleural IMPRESSION: Worsening consolidation Electronically Signed   By: Andreas Newport M.D.   On: 03/19/2016 01:19   Dg Chest Portable 1 View  03/18/2016  CLINICAL DATA:  Fever, altered mental  status, history of hypertension and diabetes EXAM: PORTABLE CHEST 1 VIEW COMPARISON:  None. FINDINGS: There is extensive infiltrate over the left lung. This obscures the left heart border. Heart size is therefore difficult to establish but appears mildly large. To a lesser degree there is patchy multifocal right-sided infiltrate. No pleural effusions. IMPRESSION: Bilateral left greater than right infiltrates. Bilateral pneumonia is suspected. Radiographic follow-up recommended. Electronically Signed   By: Skipper Cliche M.D.   On: 03/18/2016 18:09    Microbiology: No results found for this or any previous visit (from the past 240 hour(s)).   Labs: Basic Metabolic Panel:  Recent Labs Lab 03/27/16 0218 03/28/16 0344 03/29/16 0331 03/29/16 0917 03/30/16 0444 03/31/16 0348 04/01/16 0408 04/02/16 0328  NA 142 142 141  --  141 140 134*  --   K 3.6 3.1* 2.7*  --  3.9 3.8 4.3  --   CL 108 108 107  --  110 110 103  --   CO2 20* 21* 22  --  23 20* 20*  --   GLUCOSE 137* 166* 66  --  118* 131* 157*  --   BUN 29* 21* 19  --  16 17 22*  --   CREATININE 1.01 1.08 1.17  --  1.17 1.13 1.24  --   CALCIUM 9.2 9.2 9.1  --  9.6 9.5 9.7  --   MG 1.9 2.1 2.1 1.9 2.2 2.1 2.2 2.3  PHOS 3.2  --   --   --   --   --   --   --    Liver Function Tests:  Recent Labs Lab 03/28/16 0344 03/29/16 0331 03/30/16 0444 03/31/16 0348 04/01/16 0408  AST 46* 53* 48* 32 31  ALT 39 49 57 45 42  ALKPHOS 61 61 69 61 69  BILITOT 0.4 0.6 0.6 0.7 0.6  PROT 6.4* 6.4* 7.0 7.2 7.0  ALBUMIN 2.2* 2.2* 2.4* 2.3* 2.2*   No results for input(s): LIPASE, AMYLASE in the last 168 hours.  Recent Labs Lab 04/02/16 0328  AMMONIA 29   CBC:  Recent Labs Lab 03/27/16 0218 03/28/16  0344 03/29/16 0331  WBC 16.4* 16.9* 19.4*  NEUTROABS  --  13.5*  --   HGB 11.9* 11.8* 11.4*  HCT 35.0* 35.3* 34.3*  MCV 93.1 90.7 89.6  PLT 354 382 397   Cardiac Enzymes: No results for input(s): CKTOTAL, CKMB, CKMBINDEX, TROPONINI in  the last 168 hours. BNP: BNP (last 3 results)  Recent Labs  03/19/16 0010  BNP 385.9*    ProBNP (last 3 results) No results for input(s): PROBNP in the last 8760 hours.  CBG:  Recent Labs Lab 04/01/16 0414 04/01/16 1105 04/01/16 1648 04/01/16 2200 04/02/16 0723  GLUCAP 153* 260* 261* 298* 172*       Signed:  Nita Sells MD   [facility] 04/02/2016, 10:06 AM

## 2016-04-02 NOTE — Progress Notes (Signed)
Patient has order to discharge to SNF. IV's and telemetry removed. Patient's wife states family is to take patient to facility [facility]). Report given to facility. Patient left hospital via wheelchair to son's car with all belongings.

## 2016-04-02 NOTE — Progress Notes (Signed)
Physical Therapy Treatment Patient Details Name: Julian Ross MRN: [id number] DOB: July 02, 1946 Today's Date: 04/02/2016    History of Present Illness 70 yo M with CAD s/p MI and PCI, chronic systolic heart failure, hypertension, hyperlipidemia, and diabetes mellitus who is here with hypoxic respiratory failure and pneumonia, new-onset atrial fibrillation.    PT Comments    Remarkable progress today, ambulating with min assist up to 70 feet, slight instability of left knee at times. Tolerated therapeutic exercises well. Still confused. Patient will continue to benefit from skilled physical therapy services to further improve independence with functional mobility.   Follow Up Recommendations  SNF     Equipment Recommendations  None recommended by PT    Recommendations for Other Services OT consult     Precautions / Restrictions Precautions Precautions: Fall Restrictions Weight Bearing Restrictions: No    Mobility  Bed Mobility Overal bed mobility: Needs Assistance Bed Mobility: Supine to Sit     Supine to sit: Min guard     General bed mobility comments: Min guard for safety. VC for technique. Use of rail needed.  Transfers Overall transfer level: Needs assistance Equipment used: Rolling walker (2 wheeled) Transfers: Sit to/from Stand Sit to Stand: +2 physical assistance;Min assist         General transfer comment: Min assist for boost and balance. Slightly unsteady on feet initially. VC for hand placement.  Ambulation/Gait Ambulation/Gait assistance: Min assist;+2 safety/equipment Ambulation Distance (Feet): 70 Feet Assistive device: Rolling walker (2 wheeled) Gait Pattern/deviations: Step-through pattern;Decreased stride length;Antalgic;Drifts right/left Gait velocity: decreased Gait velocity interpretation: Below normal speed for age/gender General Gait Details: Min assist for walker placement with frequent cues for proximity to this device. Lt knee with mild  buckling on occasion, +2 follow for safety. Cues for safety and awareness.  SpO2 94% on room air.   Stairs            Wheelchair Mobility    Modified Rankin (Stroke Patients Only)       Balance                                    Cognition Arousal/Alertness: Awake/alert Behavior During Therapy: WFL for tasks assessed/performed Overall Cognitive Status: Impaired/Different from baseline Area of Impairment: Orientation Orientation Level: Disoriented to;Place;Time;Situation                  Exercises General Exercises - Lower Extremity Ankle Circles/Pumps: AROM;Both;10 reps;Seated Gluteal Sets: Strengthening;Both;10 reps;Seated Long Arc Quad: Strengthening;Both;10 reps;Seated Hip Flexion/Marching: Strengthening;Both;10 reps;Seated    General Comments General comments (skin integrity, edema, etc.): Wife present, surprised at rapid improvement in physical abilities today.      Pertinent Vitals/Pain Pain Assessment: Faces Faces Pain Scale: Hurts a little bit Pain Location: Lt lateral thigh (chronic) Pain Descriptors / Indicators: Aching Pain Intervention(s): Monitored during session;Repositioned    Home Living                      Prior Function            PT Goals (current goals can now be found in the care plan section) Acute Rehab PT Goals PT Goal Formulation: Patient unable to participate in goal setting Time For Goal Achievement: 04/10/16 Potential to Achieve Goals: Fair Progress towards PT goals: Progressing toward goals    Frequency  Min 3X/week    PT Plan Current plan remains appropriate    Co-evaluation  End of Session Equipment Utilized During Treatment: Gait belt Activity Tolerance: Patient tolerated treatment well Patient left: in bed;with call bell/phone within reach;with family/visitor present (OT in room)     Time: [id number] PT Time Calculation (min) (ACUTE ONLY): 13 min  Charges:  $Gait  Training: 8-22 mins                    G Codes:      Ellouise Newer 2016/04/23, 3:55 PM Camille Bal [place], [place]

## 2016-04-02 NOTE — Clinical Social Work Placement (Signed)
   CLINICAL SOCIAL WORK PLACEMENT  NOTE  Date:  04/02/2016  Patient Details  Name: Julian Ross MRN: [id number] Date of Birth: 19-Sep-1946  Clinical Social Work is seeking post-discharge placement for this patient at the [place] level of care (*CSW will initial, date and re-position this form in  chart as items are completed):  Yes   Patient/family provided with [facility]'s list of facilities offering this level of care within the geographic area requested by the patient (or if unable, by the patient's family).  Yes   Patient/family informed of their freedom to choose among providers that offer the needed level of care, that participate in Medicare, Medicaid or managed care program needed by the patient, have an available bed and are willing to accept the patient.  Yes   Patient/family informed of [facility]'s ownership interest in [facility] and [facility], as well as of the fact that they are under no obligation to receive care at these facilities.  PASRR submitted to EDS on 03/28/16     PASRR number received on 03/28/16     Existing PASRR number confirmed on       FL2 transmitted to all facilities in geographic area requested by pt/family on 03/28/16     FL2 transmitted to all facilities within larger geographic area on       Patient informed that his/her managed care company has contracts with or will negotiate with certain facilities, including the following:        Yes   Patient/family informed of bed offers received.  Patient chooses bed at  [facility])     Physician recommends and patient chooses bed at      Patient to be transferred to  [facility] ) on 04/02/16.  Patient to be transferred to facility by  [facility])     Patient family notified on 04/02/16 of transfer.  Name of family member notified:  Patient and wife aware     PHYSICIAN       Additional Comment:     _______________________________________________ Raymondo Band, LCSW 04/02/2016, 2:59 PM

## 2016-04-02 NOTE — Progress Notes (Signed)
Occupational Therapy Treatment Patient Details Name: Julian Ross MRN: [id number] DOB: [birth year] Today's Date: 04/02/2016    History of present illness 70 yo M with CAD s/p MI and PCI, chronic systolic heart failure, hypertension, hyperlipidemia, and diabetes mellitus who is here with hypoxic respiratory failure and pneumonia, new-onset atrial fibrillation.   OT comments  Patient making great progress towards OT goals, continue plan of care for now. Pt able to follow one-step commands with increased time. Pt performing sit to/from stands with min assist (1 person). Pt's current biggest limitations are decreased cognition and decreased functional strength & endurance.   Follow Up Recommendations  SNF;Supervision/Assistance - 24 hour    Equipment Recommendations  Other (comment) (TBD next venue of care)    Recommendations for Other Services  None at this time   Precautions / Restrictions Precautions Precautions: Fall Restrictions Weight Bearing Restrictions: No    Mobility Bed Mobility - Per PT note/session Overal bed mobility: Needs Assistance Bed Mobility: Supine to Sit     Supine to sit: Min guard     General bed mobility comments: Min guard for safety. VC for technique. Use of rail needed.  Transfers Overall transfer level: Needs assistance Equipment used: Rolling walker (2 wheeled) Transfers: Sit to/from Stand Sit to Stand: Min assist General transfer comment: Min assist for boost and balance. Slightly unsteady on feet initially. VC for hand placement.    Balance Overall balance assessment: Needs assistance Sitting-balance support: No upper extremity supported;Feet supported Sitting balance-Leahy Scale: Good     Standing balance support: During functional activity;Single extremity supported Standing balance-Leahy Scale: Fair Standing balance comment: standing at sink to perform grooming tasks    ADL Overall ADL's : Needs assistance/impaired General ADL Comments:  Pt with major improvements since 3/28. Pt now able to perform ADLs at an overall min assist level. Pt able to follow one step commands with increased time, multiple step commands inconsistently. Pt able to cross over BLEs in order to perform LB ADLs. Pt ambulated to/from BR and stood at sink for grooming tasks. Pt requested to sit down at end of session secondary to feeling tired and out of breathe; HR=80 and 02 sats on RA=95% after activity.      Cognition   Behavior During Therapy: WFL for tasks assessed/performed Overall Cognitive Status: Impaired/Different from baseline Area of Impairment: Orientation;Following commands;Safety/judgement;Awareness Orientation Level: Disoriented to;Place;Time;Situation      Following Commands: Follows one step commands with increased time;Follows multi-step commands inconsistently Safety/Judgement: Decreased awareness of safety Awareness: Emergent Problem Solving: Requires verbal cues                   Pertinent Vitals/ Pain       Pain Assessment: Faces Pain Score: 0-No pain Faces Pain Scale: No hurt Pain Location: Lt lateral thigh (chronic) Pain Descriptors / Indicators: Aching Pain Intervention(s): Monitored during session;Repositioned   Frequency Min 2X/week     Progress Toward Goals  OT Goals(current goals can now befound in the care plan section)  Progress towards OT goals: Progressing toward goals  Acute Rehab OT Goals Patient Stated Goal: go to rehab today OT Goal Formulation: With patient/family Time For Goal Achievement: 04/10/16 Potential to Achieve Goals: Good  Plan Discharge plan remains appropriate    End of Session Equipment Utilized During Treatment: Gait belt;Rolling walker   Activity Tolerance Patient tolerated treatment well   Patient Left in bed;with call bell/phone within reach;with bed alarm set;with family/visitor present (seated EOB)    Time: [id number] OT  Time Calculation (min): 15 min  Charges: OT  General Charges $OT Visit: 1 Procedure OT Treatments $Self Care/Home Management : 8-22 mins  Chrys Racer , MS, OTR/L, [place] Pager: [phone]  04/02/2016, 4:08 PM

## 2016-04-02 NOTE — Progress Notes (Signed)
CSW spoke with patient's wife regarding transportation needs. Per wife, the family will transport the patient to [facility] on today. CSW made RN aware. CSW also contacted facility to inform. Any hard scripts will need to be sent with family to facility. No further CSW needs requested at this time. CSW to sign off.   Lucius Conn, [place] Worker [facility] Ph: [phone]

## 2016-04-03 ENCOUNTER — Non-Acute Institutional Stay (SKILLED_NURSING_FACILITY): Payer: Medicare Other | Admitting: Adult Health

## 2016-04-03 ENCOUNTER — Encounter: Payer: Self-pay | Admitting: Adult Health

## 2016-04-03 DIAGNOSIS — R5381 Other malaise: Secondary | ICD-10-CM

## 2016-04-03 DIAGNOSIS — I252 Old myocardial infarction: Secondary | ICD-10-CM | POA: Diagnosis not present

## 2016-04-03 DIAGNOSIS — E669 Obesity, unspecified: Secondary | ICD-10-CM

## 2016-04-03 DIAGNOSIS — N183 Chronic kidney disease, stage 3 unspecified: Secondary | ICD-10-CM

## 2016-04-03 DIAGNOSIS — E119 Type 2 diabetes mellitus without complications: Secondary | ICD-10-CM

## 2016-04-03 DIAGNOSIS — E1169 Type 2 diabetes mellitus with other specified complication: Secondary | ICD-10-CM

## 2016-04-03 DIAGNOSIS — I4891 Unspecified atrial fibrillation: Secondary | ICD-10-CM | POA: Diagnosis not present

## 2016-04-03 DIAGNOSIS — G4733 Obstructive sleep apnea (adult) (pediatric): Secondary | ICD-10-CM | POA: Diagnosis not present

## 2016-04-03 DIAGNOSIS — F411 Generalized anxiety disorder: Secondary | ICD-10-CM

## 2016-04-03 DIAGNOSIS — D638 Anemia in other chronic diseases classified elsewhere: Secondary | ICD-10-CM | POA: Diagnosis not present

## 2016-04-03 DIAGNOSIS — F329 Major depressive disorder, single episode, unspecified: Secondary | ICD-10-CM

## 2016-04-03 DIAGNOSIS — E876 Hypokalemia: Secondary | ICD-10-CM

## 2016-04-03 DIAGNOSIS — I5022 Chronic systolic (congestive) heart failure: Secondary | ICD-10-CM | POA: Diagnosis not present

## 2016-04-03 DIAGNOSIS — J9601 Acute respiratory failure with hypoxia: Secondary | ICD-10-CM | POA: Diagnosis not present

## 2016-04-03 DIAGNOSIS — M0609 Rheumatoid arthritis without rheumatoid factor, multiple sites: Secondary | ICD-10-CM | POA: Diagnosis not present

## 2016-04-03 DIAGNOSIS — G894 Chronic pain syndrome: Secondary | ICD-10-CM

## 2016-04-03 DIAGNOSIS — F32A Depression, unspecified: Secondary | ICD-10-CM

## 2016-04-03 DIAGNOSIS — R569 Unspecified convulsions: Secondary | ICD-10-CM

## 2016-04-03 DIAGNOSIS — N400 Enlarged prostate without lower urinary tract symptoms: Secondary | ICD-10-CM

## 2016-04-03 NOTE — Progress Notes (Signed)
Patient ID: Julian Ross, male   DOB: 27-Oct-1946, 70 y.o.   MRN: [id number]    DATE:  04/03/2016    MRN:  [id number]  BIRTHDAY: Feb 14, 1946  Facility:  [facility] Home Location:  [place] [facility] [place] Room Number: [id number]  LEVEL OF CARE:  SNF (31)  Contact Information    Name Relation Home Work Mobile   [place] Son [phone]     [place], [place] [phone]         Code Status History    Date Active Date Inactive Code Status Order ID Comments User Context   03/20/2016 12:01 PM 04/02/2016  7:48 PM Partial Code [id number]  Juanito Doom, MD Inpatient   03/19/2016  5:48 AM 03/20/2016 12:01 PM Full Code [id number]  Dannielle Burn, MD ED   03/18/2016  7:46 PM 03/19/2016  5:48 AM Full Code [id number]  Norval Morton, MD ED    Questions for Most Recent Historical Code Status (Order [id number])    Question Answer Comment   In the event of cardiac or respiratory ARREST: Perform CPR No    In the event of cardiac or respiratory ARREST: Perform Intubation/Mechanical Ventilation Yes    In the event of cardiac or respiratory ARREST: Perform Defibrillation or Cardioversion if indicated No    Antiarrhythmic drugs - Any drug used to treat arrhythmias Yes    Vasoactive drug - Any drug used to stabilize blood pressure Yes     Advance Directive Documentation        Most Recent Value   Type of Advance Directive  Out of facility DNR (pink MOST or yellow form)   Pre-existing out of facility DNR order (yellow form or pink MOST form)     "MOST" Form in Place?         Chief Complaint  Patient presents with  . Hospitalization Follow-up    HISTORY OF PRESENT ILLNESS:  This is a 70 year old male who has been admitted to [facility] on 04/02/16 from [place]. He has PMH of Substance abuse (polypharmacy), partial blindness, hypertension, chronic systolic CHF, hyperlipidemia, diabetes mellitus type 2, MI, CAD native artery S/P PCI. He was having worsening SOB with chest  tightness. Chest  X-ray showed bilateral infiltrates suggestive of pneumonia. He was intubated. He was given Aztreonam X 10 days. He was given Lasix, Bidil and Cozaar for chronic systolic CHF. He had new onset A-fib and Eliquis was started.   He has been admitted for a short-term rehabilitation.  PAST MEDICAL HISTORY:  Past Medical History  Diagnosis Date  . MI (myocardial infarction) ([place])   . Diabetes mellitus type 2 in obese ([place])   . Hypertension   . Septic shock ([place])   . CKD (chronic kidney disease), stage III   . Edema of leg   . Elevated troponin   . Anemia   . Acute hypoxemic respiratory failure ([place])   . Altered mental status   . Atrial fibrillation, new onset ([place])   . AKI (acute kidney injury) ([place])   . Acute respiratory failure with hypoxia ([place])   . Chronic systolic CHF (congestive heart failure) ([place])   . Cardiomyopathy, ischemic   . Demand ischemia ([place])   . Acute renal failure superimposed on stage 3 chronic kidney disease ([place])   . Hypokalemia   . Rheumatoid arthritis of multiple sites with negative rheumatoid factor ([place])   . Acute encephalopathy   . Anxiety state      CURRENT MEDICATIONS: Reviewed  Patient's  Medications  New Prescriptions   No medications on file  Previous Medications   APIXABAN (ELIQUIS) 5 MG TABS TABLET    Take 1 tablet (5 mg total) by mouth 2 (two) times daily.   BIMATOPROST (LUMIGAN) 0.01 % SOLN    Place 1 drop into both eyes at bedtime.   CAPSICUM (ZOSTRIX) 0.075 % TOPICAL CREAM    Apply topically 2 (two) times daily.   FOLIC ACID (FOLVITE) 1 MG TABLET    Take 1 mg by mouth daily.   FUROSEMIDE (LASIX) 40 MG TABLET    Take 1 tablet (40 mg total) by mouth 2 (two) times daily.   GABAPENTIN (NEURONTIN) 100 MG CAPSULE    Take 1 capsule (100 mg total) by mouth daily.   INSULIN GLARGINE (LANTUS) 100 UNIT/ML INJECTION    Inject 0.05 mLs (5 Units total) into the skin daily.   INSULIN LISPRO PROTAMINE-LISPRO (HUMALOG 50/50 MIX) (50-50) 100  UNIT/ML SUSP INJECTION    Inject 40-50 Units into the skin 3 (three) times daily with meals. Inject 50 units subcutaneously with breakfast and 40 units with lunch & supper   ISOSORBIDE-HYDRALAZINE (BIDIL) 20-37.5 MG TABLET    Take 1 tablet by mouth every 8 (eight) hours.   LEUCOVORIN (WELLCOVORIN) 10 MG TABLET    Take 10 mg by mouth See admin instructions. Take 1 tablet (10 mg) by mouth every 12 hours and 24 hours after the methotrexate dose   LEVETIRACETAM (KEPPRA) 500 MG TABLET    Take 1 tablet (500 mg total) by mouth every 12 (twelve) hours.   LOSARTAN (COZAAR) 50 MG TABLET    Take 50 mg by mouth daily.   MAGNESIUM OXIDE (MAG-OX) 400 (241.3 MG) MG TABLET    Take 1 tablet (400 mg total) by mouth 2 (two) times daily.   METHOTREXATE SODIUM (METHOTREXATE, PF,) 50 MG/2ML INJECTION    Inject 15 mg into the muscle once a week. Inject 0.6 ml (15 mg) - on Fridays   METOPROLOL 75 MG TABS    Take 75 mg by mouth 2 (two) times daily.   MISC NATURAL PRODUCTS (OSTEO BI-FLEX ADV DOUBLE ST PO)    Take 1 tablet by mouth 2 (two) times daily.   PAROXETINE (PAXIL) 10 MG TABLET    Take 1 tablet (10 mg total) by mouth daily.   POTASSIUM CHLORIDE SA (K-DUR,KLOR-CON) 20 MEQ TABLET    Take 2 tablets (40 mEq total) by mouth 3 (three) times daily.   PREDNISONE (DELTASONE) 5 MG TABLET    Take 5 mg by mouth daily with breakfast.   TAMSULOSIN (FLOMAX) 0.4 MG CAPS CAPSULE    Take 0.4 mg by mouth daily.   Modified Medications   No medications on file  Discontinued Medications   ASPIRIN EC 81 MG TABLET    Take 1 tablet (81 mg total) by mouth daily.   BEE POLLEN PO    Take 1 capsule by mouth daily.   PRESCRIPTION MEDICATION    Inhale into the lungs See admin instructions. Use whenever sleeping     Allergies  Allergen Reactions  . Ace Inhibitors Cough  . Atenolol Other (See Comments)    Unknown reaction  . Contrast Media [Iodinated Diagnostic Agents] Rash  . Penicillins Hives and Rash    Has patient had a PCN reaction  causing immediate rash, facial/tongue/throat swelling, SOB or lightheadedness with hypotension: Yes Has patient had a PCN reaction causing severe rash involving mucus membranes or skin necrosis: No Has patient had a PCN  reaction that required hospitalization pt was in the hospital at time of last reaction - heart attack Has patient had a PCN reaction occurring within the last 10 years: No If all of the above answers are "NO", then may proceed with Cephalosporin use.     REVIEW OF SYSTEMS:  GENERAL: no change in appetite, no fatigue, no weight changes, no fever, chills or weakness EYES: Denies change in vision, dry eyes, eye pain, itching or discharge EARS: Denies change in hearing, ringing in ears, or earache NOSE: Denies nasal congestion or epistaxis MOUTH and THROAT: Denies oral discomfort, gingival pain or bleeding, pain from teeth or hoarseness   RESPIRATORY: no cough, SOB, DOE, wheezing, hemoptysis CARDIAC: no chest pain, edema or palpitations GI: no abdominal pain, diarrhea, constipation, heart burn, nausea or vomiting GU: Denies dysuria, frequency, hematuria, incontinence, or discharge PSYCHIATRIC: Denies feeling of depression or anxiety. No report of hallucinations, insomnia, paranoia, or agitation   PHYSICAL EXAMINATION  GENERAL APPEARANCE: Well nourished. In no acute distress. Normal body habitus SKIN:  Skin is warm and dry.  HEAD: Normal in size and contour. No evidence of trauma EYES: Lids open and close normally. No blepharitis, entropion or ectropion. PERRL. Conjunctivae are clear and sclerae are white. Lenses are without opacity EARS: Pinnae are normal. Patient hears normal voice tunes of the examiner MOUTH and THROAT: Lips are without lesions. Oral mucosa is moist and without lesions. Tongue is normal in shape, size, and color and without lesions NECK: supple, trachea midline, no neck masses, no thyroid tenderness, no thyromegaly LYMPHATICS: no LAN in the neck, no  supraclavicular LAN RESPIRATORY: breathing is even & unlabored, BS CTAB CARDIAC: Irregularly irregular, no murmur,no extra heart sounds, no edema GI: abdomen soft, normal BS, no masses, no tenderness, no hepatomegaly, no splenomegaly EXTREMITIES:  Able to move X 4 extremities PSYCHIATRIC: Alert and oriented X 3. Affect and behavior are appropriate  LABS/RADIOLOGY: Labs reviewed: Basic Metabolic Panel:  Recent Labs  03/24/16 2008  03/25/16 0805  03/27/16 0218  03/30/16 0444 03/31/16 0348 04/01/16 0408 04/02/16 0328  NA  --   < >  --   < > 142  < > 141 140 134*  --   K  --   < >  --   < > 3.6  < > 3.9 3.8 4.3  --   CL  --   < >  --   < > 108  < > 110 110 103  --   CO2  --   < >  --   < > 20*  < > 23 20* 20*  --   GLUCOSE  --   < >  --   < > 137*  < > 118* 131* 157*  --   BUN  --   < >  --   < > 29*  < > 16 17 22*  --   CREATININE  --   < >  --   < > 1.01  < > 1.17 1.13 1.24  --   CALCIUM  --   < >  --   < > 9.2  < > 9.6 9.5 9.7  --   MG  --   --  2.1  --  1.9  < > 2.2 2.1 2.2 2.3  PHOS 2.1*  --  1.5*  --  3.2  --   --   --   --   --   < > = values in this interval  not displayed. Liver Function Tests:  Recent Labs  03/30/16 0444 03/31/16 0348 04/01/16 0408  AST 48* 32 31  ALT 57 45 42  ALKPHOS 69 61 69  BILITOT 0.6 0.7 0.6  PROT 7.0 7.2 7.0  ALBUMIN 2.4* 2.3* 2.2*     Recent Labs  03/21/16 1815 03/23/16 1333 04/02/16 0328  AMMONIA 43* 27 29   CBC:  Recent Labs  03/24/16 0406 03/25/16 0300  03/27/16 0218 03/28/16 0344 03/29/16 0331  WBC 12.2* 12.0*  < > 16.4* 16.9* 19.4*  NEUTROABS 9.6* 9.4*  --   --  13.5*  --   HGB 9.8* 9.4*  < > 11.9* 11.8* 11.4*  HCT 29.9* 30.2*  < > 35.0* 35.3* 34.3*  MCV 93.7 93.8  < > 93.1 90.7 89.6  PLT 269 334  < > 354 382 397  < > = values in this interval not displayed.  Lipid Panel:  Recent Labs  03/22/16 0320  HDL 25*   Cardiac Enzymes:  Recent Labs  03/18/16 2034 03/19/16 0010 03/19/16 0217  TROPONINI  0.13* 0.11* 0.10*   CBG:  Recent Labs  04/01/16 2200 04/02/16 0723 04/02/16 1143  GLUCAP 298* 172* 288*     Ct Head Wo Contrast  03/23/2016  CLINICAL DATA:  Increasing lethargy after extubation. EXAM: CT HEAD WITHOUT CONTRAST TECHNIQUE: Contiguous axial images were obtained from the base of the skull through the vertex without intravenous contrast. COMPARISON:  03/18/2016. FINDINGS: No evidence for acute infarction, hemorrhage, mass lesion, hydrocephalus, or extra-axial fluid. Global atrophy. Chronic microvascular ischemic change. No osseous lesion. Vascular calcification. Stable changes of probable chronic sinus disease. Unchanged appearance from priors. IMPRESSION: Stable chronic changes as described. No findings suggestive of acute cerebral infarction or hemorrhage. Electronically Signed   By: Staci Righter M.D.   On: 03/23/2016 14:54   Ct Head Wo Contrast  03/18/2016  CLINICAL DATA:  STROKE -LIKE SYMPTOMS-PLEASE EVALUATE FOR STROKE. Time course is not indicated. EXAM: CT HEAD WITHOUT CONTRAST TECHNIQUE: Contiguous axial images were obtained from the base of the skull through the vertex without intravenous contrast. COMPARISON:  None. FINDINGS: Diffuse cerebral atrophy. Ventricular dilatation consistent with central atrophy. Patchy areas of low-attenuation change in the deep white matter consistent with small vessel ischemia. No mass effect or midline shift. No abnormal extra-axial fluid collections. Gray-white matter junctions are distinct. Basal cisterns are not effaced. No evidence of acute intracranial hemorrhage. No depressed skull fractures. Mucosal thickening throughout the left maxillary antrum, left ethmoid air cells, left frontal, and left sphenoid sinuses. Minimal mucosal thickening in the right paranasal sinuses. Visualized mastoid air cells are not opacified. Vascular calcifications. IMPRESSION: No acute intracranial abnormalities. Chronic atrophy and small vessel ischemic changes.  Electronically Signed   By: Lucienne Capers M.D.   On: 03/18/2016 18:19   Dg Chest Port 1 View  04/01/2016  CLINICAL DATA:  Pneumonia, hypertension and diabetes. EXAM: PORTABLE CHEST 1 VIEW COMPARISON:  03/28/2016 and prior studies FINDINGS: Endotracheal tube and NGT to have been removed. Bilateral airspace opacities not significantly changed. There is no evidence of pneumothorax. No other significant changes noted. IMPRESSION: Support apparatus removal as described. Little significant change in bilateral airspace opacities. Electronically Signed   By: Margarette Canada M.D.   On: 04/01/2016 08:46   Dg Chest Port 1 View  03/28/2016  CLINICAL DATA:  Pneumonia, hypertension, diabetes mellitus EXAM: PORTABLE CHEST 1 VIEW COMPARISON:  Portable exam 1823 hours compared to 03/25/2016 FINDINGS: Slight rotation to the LEFT. Normal heart size,  mediastinal contours and pulmonary vascularity for technique. Coronary arterial stent noted. Infiltrates identified in the periphery of the mid to lower LEFT lung question pneumonia. Minimal RIGHT basilar atelectasis. No definite pleural effusion, pneumothorax or acute osseous findings. IMPRESSION: Persistent infiltrate LEFT lung. Electronically Signed   By: Lavonia Dana M.D.   On: 03/28/2016 18:45   Dg Chest Port 1 View  03/25/2016  CLINICAL DATA:  Acute respiratory failure, hypoxemia EXAM: PORTABLE CHEST 1 VIEW COMPARISON:  03/24/2016 FINDINGS: Cardiomediastinal silhouette is stable. Endotracheal tube and NG tube are unchanged in position. Persistent bilateral patchy airspace opacities left greater than right without significant change in aeration from prior exam. IMPRESSION: Stable support apparatus. No convincing pulmonary edema. Persistent bilateral patchy airspace opacities left greater than right. Electronically Signed   By: Lahoma Crocker M.D.   On: 03/25/2016 08:58   Dg Chest Port 1 View  03/24/2016  CLINICAL DATA:  Acute respiratory failure with hypoxemia. EXAM: PORTABLE  CHEST 1 VIEW COMPARISON:  03/23/2016. FINDINGS: Endotracheal tube 3.5 cm above carina. Orogastric tube in the stomach. Slightly improved lung volumes with persistent BILATERAL patchy opacities representing possible pneumonia, edema, or ARDS. IMPRESSION: No active disease. Electronically Signed   By: Staci Righter M.D.   On: 03/24/2016 09:33   Dg Chest Port 1 View  03/23/2016  CLINICAL DATA:  Acute respiratory failure EXAM: PORTABLE CHEST 1 VIEW COMPARISON:  03/23/2016 FINDINGS: Endotracheal tube and nasogastric catheter are noted in satisfactory position. The cardiac shadow is stable. Lungs are again well aerated with bilateral airspace opacities stable from the previous exam. No new focal abnormality is noted. IMPRESSION: No change from the prior exam with the exception of nasogastric catheter placement Electronically Signed   By: Inez Catalina M.D.   On: 03/23/2016 07:30   Dg Chest Port 1 View  03/23/2016  CLINICAL DATA:  Endotracheal tube placement EXAM: PORTABLE CHEST 1 VIEW COMPARISON:  03/21/2016 FINDINGS: Endotracheal tube with tip measuring 4.7 cm above the carina. Shallow inspiration. Borderline heart size. Diffuse patchy airspace infiltrates throughout both lungs demonstrate progression since previous study. This may represent pneumonia, edema, or ARDS. Probable small bilateral pleural effusions. No pneumothorax. IMPRESSION: Endotracheal tube tip measures 4.7 cm above the carina. Progression of diffuse bilateral airspace infiltrates in the lungs. Electronically Signed   By: Lucienne Capers M.D.   On: 03/23/2016 01:21   Dg Chest Port 1 View  03/21/2016  CLINICAL DATA:  Respiratory failure.  Hypoxia. EXAM: PORTABLE CHEST 1 VIEW COMPARISON:  Single-view of the chest 03/18/2016, 03/19/2016 and 03/20/2016. FINDINGS: ET tube and NG tube remain in place. Bilateral airspace disease is worse on the left but is improved. There is no pneumothorax or pleural effusion. Heart size is normal. IMPRESSION:  Improved left worse than right airspace disease. No new abnormality. Electronically Signed   By: Inge Rise M.D.   On: 03/21/2016 07:17   Dg Chest Port 1 View  03/20/2016  CLINICAL DATA:  Acute respiratory failure with hypoxemia. EXAM: PORTABLE CHEST 1 VIEW COMPARISON:  Chest x-rays dated 03/19/2016 and 03/18/2016. FINDINGS: Endotracheal tube remains adequately positioned with tip just above the level of the carina. Enteric tube passes below the diaphragm. Cardiomediastinal silhouette appears stable in size and configuration. There is persistent central pulmonary vascular congestion and patchy bilateral airspace opacities which are most suggestive of pulmonary edema pattern. Perhaps mildly improved aeration within the left lung. No pneumothorax seen. IMPRESSION: Patchy bilateral airspace opacities, multifocal pneumonia versus pulmonary edema, not significantly changed in overall extent, perhaps  slightly improved aeration within the left lung. Favor pulmonary edema. ARDS is another consideration. Electronically Signed   By: Franki Cabot M.D.   On: 03/20/2016 13:20   Dg Chest Port 1 View  03/19/2016  CLINICAL DATA:  Respiratory failure EXAM: PORTABLE CHEST 1 VIEW COMPARISON:  03/19/2016 FINDINGS: The endotracheal tube is 5.5 cm above the carina. Multifocal consolidation persists bilaterally, probably not significantly changed. No pneumothorax. IMPRESSION: Satisfactorily positioned ETT. No significant interval change in the bilateral airspace opacities. Electronically Signed   By: Andreas Newport M.D.   On: 03/19/2016 06:28   Dg Chest Port 1 View  03/19/2016  CLINICAL DATA:  Dyspnea for 2 months, worsened over the past 2 days. EXAM: PORTABLE CHEST 1 VIEW COMPARISON:  03/18/2016 FINDINGS: Multifocal consolidation has mildly worsened from 03/18/2016, now with more confluent opacity in the central right lung and in the left upper lobe periphery. There may be a left pleural IMPRESSION: Worsening  consolidation Electronically Signed   By: Andreas Newport M.D.   On: 03/19/2016 01:19   Dg Chest Portable 1 View  03/18/2016  CLINICAL DATA:  Fever, altered mental status, history of hypertension and diabetes EXAM: PORTABLE CHEST 1 VIEW COMPARISON:  None. FINDINGS: There is extensive infiltrate over the left lung. This obscures the left heart border. Heart size is therefore difficult to establish but appears mildly large. To a lesser degree there is patchy multifocal right-sided infiltrate. No pleural effusions. IMPRESSION: Bilateral left greater than right infiltrates. Bilateral pneumonia is suspected. Radiographic follow-up recommended. Electronically Signed   By: Skipper Cliche M.D.   On: 03/18/2016 18:09    ASSESSMENT/PLAN:  Physical deconditioning - for rehabilitation  Acute respiratory failure with hypoxia due to HCHP/severe sepsis - was intubated in hospital; completed  10 days of Aztreonam  OSA - on CPAP  Chronic systolic CHF - continue Lasix 40 mg twice a day, metoprolol 75 mg by mouth twice a day, Cozaar 50 mg twice a day and Bidil 20-37.5 mg BID; follow-up with Dr. Ames Dura in [place]   Atrial Fibrillation, new onset - rate-controlled - recently started on Eliquis and to continue Netoprolol  Hx of MI -  Has CAD S/P PCI; was transitioned from Plavix to ASA 81 mg daily  Chronic CKD stage III - creatinine 1.24; check BMP in 1 week  Hypokalemia -continue KCl ER 20 meq 2 tabs by mouth 3 times a day; will monitor  Hypomagnesemia - continue Mag-Ox 400 mg daily; magnesium level in 1 week  Anemia of chronic disease - hemoglobin 11.4; CBC in 1 week  Rheumatoid arthritis - continue Leucovorin and methotrexate and prednisone  Diabetes mellitus, type II - hemoglobin A1c 9.2; continue Humalog 50 units at lunch and dinner subcutaneous and Lantus 5 units subcutaneous daily at bedtime  Anxiety - mood is stable; continue Xanax  Chronic pain - continue gabapentin and 100 mg by mouth  daily,  Seizure - continue Keppra 500 mg 1 tab by mouth every 12 hours and Neurontin 100 mg by mouth daily  Depression - continue Paxil 10 mg 1 tab by mouth daily  BPH - stable; continue Flomax 0.4 mg 1 capsule by mouth daily    Goals of care:  Short-term rehabilitation    [facility], [facility] [phone]

## 2016-04-04 ENCOUNTER — Non-Acute Institutional Stay (SKILLED_NURSING_FACILITY): Payer: Medicare Other | Admitting: Internal Medicine

## 2016-04-04 ENCOUNTER — Encounter: Payer: Self-pay | Admitting: Internal Medicine

## 2016-04-04 DIAGNOSIS — J189 Pneumonia, unspecified organism: Secondary | ICD-10-CM | POA: Diagnosis not present

## 2016-04-04 DIAGNOSIS — G4733 Obstructive sleep apnea (adult) (pediatric): Secondary | ICD-10-CM

## 2016-04-04 DIAGNOSIS — G8929 Other chronic pain: Secondary | ICD-10-CM

## 2016-04-04 DIAGNOSIS — E669 Obesity, unspecified: Secondary | ICD-10-CM

## 2016-04-04 DIAGNOSIS — N183 Chronic kidney disease, stage 3 unspecified: Secondary | ICD-10-CM

## 2016-04-04 DIAGNOSIS — K5901 Slow transit constipation: Secondary | ICD-10-CM

## 2016-04-04 DIAGNOSIS — E119 Type 2 diabetes mellitus without complications: Secondary | ICD-10-CM

## 2016-04-04 DIAGNOSIS — F32A Depression, unspecified: Secondary | ICD-10-CM

## 2016-04-04 DIAGNOSIS — D638 Anemia in other chronic diseases classified elsewhere: Secondary | ICD-10-CM | POA: Diagnosis not present

## 2016-04-04 DIAGNOSIS — I2510 Atherosclerotic heart disease of native coronary artery without angina pectoris: Secondary | ICD-10-CM

## 2016-04-04 DIAGNOSIS — M0609 Rheumatoid arthritis without rheumatoid factor, multiple sites: Secondary | ICD-10-CM

## 2016-04-04 DIAGNOSIS — F329 Major depressive disorder, single episode, unspecified: Secondary | ICD-10-CM

## 2016-04-04 DIAGNOSIS — R5381 Other malaise: Secondary | ICD-10-CM

## 2016-04-04 DIAGNOSIS — I4891 Unspecified atrial fibrillation: Secondary | ICD-10-CM | POA: Diagnosis not present

## 2016-04-04 DIAGNOSIS — E46 Unspecified protein-calorie malnutrition: Secondary | ICD-10-CM

## 2016-04-04 DIAGNOSIS — G40909 Epilepsy, unspecified, not intractable, without status epilepticus: Secondary | ICD-10-CM

## 2016-04-04 DIAGNOSIS — I5022 Chronic systolic (congestive) heart failure: Secondary | ICD-10-CM | POA: Diagnosis not present

## 2016-04-04 DIAGNOSIS — E1169 Type 2 diabetes mellitus with other specified complication: Secondary | ICD-10-CM

## 2016-04-04 NOTE — Progress Notes (Signed)
LOCATION: [facility]  PCP: No PCP Per Patient   Code Status: DNR  Goals of care: Advanced Directive information Advanced Directives 04/03/2016  Does patient have an advance directive? Yes  Type of Advance Directive Out of facility DNR (pink MOST or yellow form)  Does patient want to make changes to advanced directive? No - Patient declined  Copy of advanced directive(s) in chart? Yes  Would patient like information on creating an advanced directive? -       Extended Emergency Contact Information Primary Emergency Contact: Llana Aliment [place] of [place] Phone: [phone] Relation: Son Secondary Emergency Contact: Francia Greaves Address: [address]          [place], [place] [address] Johnnette Litter of [place] Phone: [phone] Mobile Phone: [phone] Relation: Spouse   Allergies  Allergen Reactions  . Ace Inhibitors Cough  . Atenolol Other (See Comments)    Unknown reaction  . Contrast Media [Iodinated Diagnostic Agents] Rash  . Penicillins Hives and Rash    Has patient had a PCN reaction causing immediate rash, facial/tongue/throat swelling, SOB or lightheadedness with hypotension: Yes Has patient had a PCN reaction causing severe rash involving mucus membranes or skin necrosis: No Has patient had a PCN reaction that required hospitalization pt was in the hospital at time of last reaction - heart attack Has patient had a PCN reaction occurring within the last 10 years: No If all of the above answers are "NO", then may proceed with Cephalosporin use.    Chief Complaint  Patient presents with  . New Admit To SNF    New Admission     HPI:  Patient is a 70 y.o. male seen today for short term rehabilitation post hospital admission from 03/18/16-04/02/16 with septic shock and acute respiratory failure from HCAP. He required intubation and was treated with antibiotics. He had new onset afib and was started on eliquis. He was noted to have  demand ischemia. He has PMH of partial blindness, hypertension, chronic systolic CHF, hyperlipidemia, diabetes mellitus type 2, CAD among others.   Review of Systems:  Constitutional: Negative for fever, chills, diaphoresis. Has low nergy level. HENT: Negative for headache, congestion, nasal discharge, difficulty swallowing.   Eyes: Negative for blurred vision Respiratory: Negative for cough and wheezing. Positive for shortness of breath with exertion.  Cardiovascular: Negative for chest pain, palpitations, leg swelling.  Gastrointestinal: Negative for heartburn, nausea, vomiting, abdominal pain. Positive for poor appetite and feeling bloated. Last bowel movement was 3 days ago. Genitourinary: Negative for dysuria and flank pain.  Musculoskeletal: Negative for back pain, fall in the facility.  Skin: Negative for itching, rash.  Neurological: Negative for dizziness. Psychiatric/Behavioral: Negative for depression   Past Medical History  Diagnosis Date  . MI (myocardial infarction) ([place])   . Diabetes mellitus type 2 in obese ([place])   . Hypertension   . Septic shock ([place])   . CKD (chronic kidney disease), stage III   . Edema of leg   . Elevated troponin   . Anemia   . Acute hypoxemic respiratory failure ([place])   . Altered mental status   . Atrial fibrillation, new onset ([place])   . AKI (acute kidney injury) ([place])   . Acute respiratory failure with hypoxia ([place])   . Chronic systolic CHF (congestive heart failure) ([place])   . Cardiomyopathy, ischemic   . Demand ischemia ([place])   . Acute renal failure superimposed on stage 3 chronic kidney disease ([place])   .  Hypokalemia   . Rheumatoid arthritis of multiple sites with negative rheumatoid factor ([place])   . Acute encephalopathy   . Anxiety state    Past Surgical History  Procedure Laterality Date  . Coronary angioplasty with stent placement      x 10  . Angioplasty     Social History:   reports that he has never smoked. He does not have  any smokeless tobacco history on file. He reports that he does not drink alcohol or use illicit drugs.  Family History  Problem Relation Age of Onset  . Heart failure Mother   . Hyperlipidemia Mother   . Hypertension Mother     Medications:   Medication List       This list is accurate as of: 04/04/16  4:01 PM.  Always use your most recent med list.               apixaban 5 MG Tabs tablet  Commonly known as:  ELIQUIS  Take 1 tablet (5 mg total) by mouth 2 (two) times daily.     bimatoprost 0.01 % Soln  Commonly known as:  LUMIGAN  Place 1 drop into both eyes at bedtime.     capsicum 0.075 % topical cream  Commonly known as:  ZOSTRIX  Apply topically 2 (two) times daily.     folic acid 1 MG tablet  Commonly known as:  FOLVITE  Take 1 mg by mouth daily.     furosemide 40 MG tablet  Commonly known as:  LASIX  Take 1 tablet (40 mg total) by mouth 2 (two) times daily.     gabapentin 100 MG capsule  Commonly known as:  NEURONTIN  Take 1 capsule (100 mg total) by mouth daily.     insulin glargine 100 UNIT/ML injection  Commonly known as:  LANTUS  Inject 0.05 mLs (5 Units total) into the skin daily.     insulin lispro protamine-lispro (50-50) 100 UNIT/ML Susp injection  Commonly known as:  HUMALOG 50/50 MIX  Inject 40-50 Units into the skin 3 (three) times daily with meals. Inject 50 units subcutaneously with breakfast and 40 units with lunch & supper     isosorbide-hydrALAZINE 20-37.5 MG tablet  Commonly known as:  BIDIL  Take 1 tablet by mouth every 8 (eight) hours.     leucovorin 10 MG tablet  Commonly known as:  WELLCOVORIN  Take 10 mg by mouth See admin instructions. Take 1 tablet (10 mg) by mouth every 12 hours and 24 hours after the methotrexate dose     levETIRAcetam 500 MG tablet  Commonly known as:  KEPPRA  Take 1 tablet (500 mg total) by mouth every 12 (twelve) hours.     losartan 50 MG tablet  Commonly known as:  COZAAR  Take 50 mg by mouth daily.       magnesium oxide 400 (241.3 Mg) MG tablet  Commonly known as:  MAG-OX  Take 1 tablet (400 mg total) by mouth 2 (two) times daily.     methotrexate (PF) 50 MG/2ML injection  Inject 15 mg into the muscle once a week. Inject 0.6 ml (15 mg) - on Fridays     Metoprolol Tartrate 75 MG Tabs  Take 75 mg by mouth 2 (two) times daily.     OSTEO BI-FLEX ADV DOUBLE ST PO  Take 1 tablet by mouth 2 (two) times daily.     PARoxetine 10 MG tablet  Commonly known as:  PAXIL  Take 1  tablet (10 mg total) by mouth daily.     potassium chloride SA 20 MEQ tablet  Commonly known as:  K-DUR,KLOR-CON  Take 2 tablets (40 mEq total) by mouth 3 (three) times daily.     predniSONE 5 MG tablet  Commonly known as:  DELTASONE  Take 5 mg by mouth daily with breakfast.     tamsulosin 0.4 MG Caps capsule  Commonly known as:  FLOMAX  Take 0.4 mg by mouth daily.        Immunizations: There is no immunization history for the selected administration types on file for this patient.   Physical Exam: Filed Vitals:   04/04/16 1554  BP: 125/79  Pulse: 87  Temp: 97.6 F (36.4 C)  TempSrc: Oral  Resp: 18  Height: 5\' 11"  (1.803 m)  Weight: 202 lb (91.627 kg)   Body mass index is 28.19 kg/(m^2).  General- elderly male, overweight, in no acute distress Head- normocephalic, atraumatic Nose- no maxillary or frontal sinus tenderness, no nasal discharge Throat- moist mucus membrane Eyes- no pallor, no icterus Neck- no cervical lymphadenopathy Cardiovascular- normal s1,s2, no murmur, no leg edema Respiratory- bilateral poor air movement, no wheeze, no rhonchi, no crackles, no use of accessory muscles Abdomen- bowel sounds present, soft, non tender Musculoskeletal- able to move all 4 extremities, generalized weakness Neurological- alert and oriented to person, place and time Skin- warm and dry Psychiatry- normal mood and affect    Labs reviewed: Basic Metabolic Panel:  Recent Labs  03/24/16 2008   03/25/16 0805  03/27/16 0218  03/30/16 0444 03/31/16 0348 04/01/16 04/01/16 0408 04/02/16 0328  NA  --   < >  --   < > 142  < > 141 140 134* 134*  --   K  --   < >  --   < > 3.6  < > 3.9 3.8  --  4.3  --   CL  --   < >  --   < > 108  < > 110 110  --  103  --   CO2  --   < >  --   < > 20*  < > 23 20*  --  20*  --   GLUCOSE  --   < >  --   < > 137*  < > 118* 131*  --  157*  --   BUN  --   < >  --   < > 29*  < > 16 17 22* 22*  --   CREATININE  --   < >  --   < > 1.01  < > 1.17 1.13 1.2 1.24  --   CALCIUM  --   < >  --   < > 9.2  < > 9.6 9.5  --  9.7  --   MG  --   --  2.1  --  1.9  < > 2.2 2.1  --  2.2 2.3  PHOS 2.1*  --  1.5*  --  3.2  --   --   --   --   --   --   < > = values in this interval not displayed. Liver Function Tests:  Recent Labs  03/30/16 0444 03/31/16 0348 04/01/16 0408  AST 48* 32 31  ALT 57 45 42  ALKPHOS 69 61 69  BILITOT 0.6 0.7 0.6  PROT 7.0 7.2 7.0  ALBUMIN 2.4* 2.3* 2.2*   No results for input(s): LIPASE, AMYLASE in  the last 8760 hours.  Recent Labs  03/21/16 1815 03/23/16 1333 04/02/16 0328  AMMONIA 43* 27 29   CBC:  Recent Labs  03/24/16 0406 03/25/16 0300  03/27/16 0218 03/28/16 0344 03/29/16 0331  WBC 12.2* 12.0*  < > 16.4* 16.9* 19.4*  NEUTROABS 9.6* 9.4*  --   --  13.5*  --   HGB 9.8* 9.4*  < > 11.9* 11.8* 11.4*  HCT 29.9* 30.2*  < > 35.0* 35.3* 34.3*  MCV 93.7 93.8  < > 93.1 90.7 89.6  PLT 269 334  < > 354 382 397  < > = values in this interval not displayed. Cardiac Enzymes:  Recent Labs  03/18/16 2034 03/19/16 0010 03/19/16 0217  TROPONINI 0.13* 0.11* 0.10*   BNP: Invalid input(s): POCBNP CBG:  Recent Labs  04/01/16 2200 04/02/16 0723 04/02/16 1143  GLUCAP 298* 172* 288*    Radiological Exams: Ct Head Wo Contrast  03/23/2016  CLINICAL DATA:  Increasing lethargy after extubation. EXAM: CT HEAD WITHOUT CONTRAST TECHNIQUE: Contiguous axial images were obtained from the base of the skull through the vertex  without intravenous contrast. COMPARISON:  03/18/2016. FINDINGS: No evidence for acute infarction, hemorrhage, mass lesion, hydrocephalus, or extra-axial fluid. Global atrophy. Chronic microvascular ischemic change. No osseous lesion. Vascular calcification. Stable changes of probable chronic sinus disease. Unchanged appearance from priors. IMPRESSION: Stable chronic changes as described. No findings suggestive of acute cerebral infarction or hemorrhage. Electronically Signed   By: Staci Righter M.D.   On: 03/23/2016 14:54   Ct Head Wo Contrast  03/18/2016  CLINICAL DATA:  STROKE -LIKE SYMPTOMS-PLEASE EVALUATE FOR STROKE. Time course is not indicated. EXAM: CT HEAD WITHOUT CONTRAST TECHNIQUE: Contiguous axial images were obtained from the base of the skull through the vertex without intravenous contrast. COMPARISON:  None. FINDINGS: Diffuse cerebral atrophy. Ventricular dilatation consistent with central atrophy. Patchy areas of low-attenuation change in the deep white matter consistent with small vessel ischemia. No mass effect or midline shift. No abnormal extra-axial fluid collections. Gray-white matter junctions are distinct. Basal cisterns are not effaced. No evidence of acute intracranial hemorrhage. No depressed skull fractures. Mucosal thickening throughout the left maxillary antrum, left ethmoid air cells, left frontal, and left sphenoid sinuses. Minimal mucosal thickening in the right paranasal sinuses. Visualized mastoid air cells are not opacified. Vascular calcifications. IMPRESSION: No acute intracranial abnormalities. Chronic atrophy and small vessel ischemic changes. Electronically Signed   By: Lucienne Capers M.D.   On: 03/18/2016 18:19   Dg Chest Port 1 View  04/01/2016  CLINICAL DATA:  Pneumonia, hypertension and diabetes. EXAM: PORTABLE CHEST 1 VIEW COMPARISON:  03/28/2016 and prior studies FINDINGS: Endotracheal tube and NGT to have been removed. Bilateral airspace opacities not  significantly changed. There is no evidence of pneumothorax. No other significant changes noted. IMPRESSION: Support apparatus removal as described. Little significant change in bilateral airspace opacities. Electronically Signed   By: Margarette Canada M.D.   On: 04/01/2016 08:46   Dg Chest Port 1 View  03/28/2016  CLINICAL DATA:  Pneumonia, hypertension, diabetes mellitus EXAM: PORTABLE CHEST 1 VIEW COMPARISON:  Portable exam 1823 hours compared to 03/25/2016 FINDINGS: Slight rotation to the LEFT. Normal heart size, mediastinal contours and pulmonary vascularity for technique. Coronary arterial stent noted. Infiltrates identified in the periphery of the mid to lower LEFT lung question pneumonia. Minimal RIGHT basilar atelectasis. No definite pleural effusion, pneumothorax or acute osseous findings. IMPRESSION: Persistent infiltrate LEFT lung. Electronically Signed   By: Crist Infante.D.  On: 03/28/2016 18:45   Dg Chest Port 1 View  03/25/2016  CLINICAL DATA:  Acute respiratory failure, hypoxemia EXAM: PORTABLE CHEST 1 VIEW COMPARISON:  03/24/2016 FINDINGS: Cardiomediastinal silhouette is stable. Endotracheal tube and NG tube are unchanged in position. Persistent bilateral patchy airspace opacities left greater than right without significant change in aeration from prior exam. IMPRESSION: Stable support apparatus. No convincing pulmonary edema. Persistent bilateral patchy airspace opacities left greater than right. Electronically Signed   By: Lahoma Crocker M.D.   On: 03/25/2016 08:58   Dg Chest Port 1 View  03/24/2016  CLINICAL DATA:  Acute respiratory failure with hypoxemia. EXAM: PORTABLE CHEST 1 VIEW COMPARISON:  03/23/2016. FINDINGS: Endotracheal tube 3.5 cm above carina. Orogastric tube in the stomach. Slightly improved lung volumes with persistent BILATERAL patchy opacities representing possible pneumonia, edema, or ARDS. IMPRESSION: No active disease. Electronically Signed   By: Staci Righter M.D.   On:  03/24/2016 09:33   Dg Chest Port 1 View  03/23/2016  CLINICAL DATA:  Acute respiratory failure EXAM: PORTABLE CHEST 1 VIEW COMPARISON:  03/23/2016 FINDINGS: Endotracheal tube and nasogastric catheter are noted in satisfactory position. The cardiac shadow is stable. Lungs are again well aerated with bilateral airspace opacities stable from the previous exam. No new focal abnormality is noted. IMPRESSION: No change from the prior exam with the exception of nasogastric catheter placement Electronically Signed   By: Inez Catalina M.D.   On: 03/23/2016 07:30   Dg Chest Port 1 View  03/23/2016  CLINICAL DATA:  Endotracheal tube placement EXAM: PORTABLE CHEST 1 VIEW COMPARISON:  03/21/2016 FINDINGS: Endotracheal tube with tip measuring 4.7 cm above the carina. Shallow inspiration. Borderline heart size. Diffuse patchy airspace infiltrates throughout both lungs demonstrate progression since previous study. This may represent pneumonia, edema, or ARDS. Probable small bilateral pleural effusions. No pneumothorax. IMPRESSION: Endotracheal tube tip measures 4.7 cm above the carina. Progression of diffuse bilateral airspace infiltrates in the lungs. Electronically Signed   By: Lucienne Capers M.D.   On: 03/23/2016 01:21   Dg Chest Port 1 View  03/21/2016  CLINICAL DATA:  Respiratory failure.  Hypoxia. EXAM: PORTABLE CHEST 1 VIEW COMPARISON:  Single-view of the chest 03/18/2016, 03/19/2016 and 03/20/2016. FINDINGS: ET tube and NG tube remain in place. Bilateral airspace disease is worse on the left but is improved. There is no pneumothorax or pleural effusion. Heart size is normal. IMPRESSION: Improved left worse than right airspace disease. No new abnormality. Electronically Signed   By: Inge Rise M.D.   On: 03/21/2016 07:17   Dg Chest Port 1 View  03/20/2016  CLINICAL DATA:  Acute respiratory failure with hypoxemia. EXAM: PORTABLE CHEST 1 VIEW COMPARISON:  Chest x-rays dated 03/19/2016 and 03/18/2016.  FINDINGS: Endotracheal tube remains adequately positioned with tip just above the level of the carina. Enteric tube passes below the diaphragm. Cardiomediastinal silhouette appears stable in size and configuration. There is persistent central pulmonary vascular congestion and patchy bilateral airspace opacities which are most suggestive of pulmonary edema pattern. Perhaps mildly improved aeration within the left lung. No pneumothorax seen. IMPRESSION: Patchy bilateral airspace opacities, multifocal pneumonia versus pulmonary edema, not significantly changed in overall extent, perhaps slightly improved aeration within the left lung. Favor pulmonary edema. ARDS is another consideration. Electronically Signed   By: Franki Cabot M.D.   On: 03/20/2016 13:20   Dg Chest Port 1 View  03/19/2016  CLINICAL DATA:  Respiratory failure EXAM: PORTABLE CHEST 1 VIEW COMPARISON:  03/19/2016 FINDINGS: The endotracheal  tube is 5.5 cm above the carina. Multifocal consolidation persists bilaterally, probably not significantly changed. No pneumothorax. IMPRESSION: Satisfactorily positioned ETT. No significant interval change in the bilateral airspace opacities. Electronically Signed   By: Andreas Newport M.D.   On: 03/19/2016 06:28   Dg Chest Port 1 View  03/19/2016  CLINICAL DATA:  Dyspnea for 2 months, worsened over the past 2 days. EXAM: PORTABLE CHEST 1 VIEW COMPARISON:  03/18/2016 FINDINGS: Multifocal consolidation has mildly worsened from 03/18/2016, now with more confluent opacity in the central right lung and in the left upper lobe periphery. There may be a left pleural IMPRESSION: Worsening consolidation Electronically Signed   By: Andreas Newport M.D.   On: 03/19/2016 01:19   Dg Chest Portable 1 View  03/18/2016  CLINICAL DATA:  Fever, altered mental status, history of hypertension and diabetes EXAM: PORTABLE CHEST 1 VIEW COMPARISON:  None. FINDINGS: There is extensive infiltrate over the left lung. This obscures  the left heart border. Heart size is therefore difficult to establish but appears mildly large. To a lesser degree there is patchy multifocal right-sided infiltrate. No pleural effusions. IMPRESSION: Bilateral left greater than right infiltrates. Bilateral pneumonia is suspected. Radiographic follow-up recommended. Electronically Signed   By: Skipper Cliche M.D.   On: 03/18/2016 18:09    Assessment/Plan  Physical deconditioning Will have him work with physical therapy and occupational therapy team to help with gait training and muscle strengthening exercises.fall precautions. Skin care. Encourage to be out of bed.   HCAP Completed antibiotics, had required intubation. Encouraged to use incentive spirometer. Breathing stable at present  Protein calorie malnutrition Dietary consult. Continue procel.  Constipation Start senokot s 2 tab qhs and monitor, hydration encouraged  Leukocytosis Monitor cbc with diff  Anemia of chronic disease Monitor cbc  afib Rate controlled. Continue metoprolol 75 mg bid, continue eliquis for anticoagulation  OSA Continue CPAP  Chronic systolic CHF continue metoprolol 75 mg bid, cozaar 50 mg bid, lasix 40 mg bid and bidil. Monitor weight and bmp. Has f/u with cardiology  Dm type 2 Lab Results  Component Value Date   HGBA1C 9.2* 03/18/2016  monitor cbg, continue lantus and humalog mix  CAD Remains chest pain free. Has hx of PCI. Continue metoprolol, cozaar and bidil. He will need to follow up with cardiology. Currently off both aspirin and plavix. Per discharge note, is to be on baby aspirin until seen by his cardiologist. Start aspirin 81 mg daily for now  ckd stage 3 Monitor bmp  Rheumatoid arthritis continue Leucovorin, methotrexate and prednisone  Chronic pain continue gabapentin and monitor clinically.  Chronic depression Stable, continue paxil  Seizure disorder Remains seizure free. Continue keppra and gabapentin     Goals of  care: short term rehabilitation   Labs/tests ordered: cbc, cmp  Family/ staff Communication: reviewed care plan with patient and nursing supervisor    Blanchie Serve, MD Internal Medicine [place], [place] [address] Cell Phone (Monday-Friday 8 am - 5 pm): [phone] On Call: [phone] and follow prompts after 5 pm and on weekends Office Phone: [phone] Office Fax: [phone]

## 2016-04-10 LAB — BASIC METABOLIC PANEL
BUN: 24 mg/dL — AB (ref 4–21)
CREATININE: 1.2 mg/dL (ref 0.6–1.3)
GLUCOSE: 82 mg/dL
POTASSIUM: 4.4 mmol/L (ref 3.4–5.3)
SODIUM: 144 mmol/L (ref 137–147)

## 2016-04-10 LAB — CBC AND DIFFERENTIAL
HCT: 36 % — AB (ref 41–53)
Hemoglobin: 11.5 g/dL — AB (ref 13.5–17.5)
Platelets: 337 10*3/uL (ref 150–399)
WBC: 11 10*3/mL

## 2016-04-16 ENCOUNTER — Encounter: Payer: Self-pay | Admitting: Physician Assistant

## 2016-04-16 ENCOUNTER — Ambulatory Visit (INDEPENDENT_AMBULATORY_CARE_PROVIDER_SITE_OTHER): Payer: Medicare Other | Admitting: Physician Assistant

## 2016-04-16 VITALS — BP 126/62 | HR 76 | Ht 71.0 in | Wt 214.0 lb

## 2016-04-16 DIAGNOSIS — R079 Chest pain, unspecified: Secondary | ICD-10-CM

## 2016-04-16 DIAGNOSIS — I4891 Unspecified atrial fibrillation: Secondary | ICD-10-CM

## 2016-04-16 DIAGNOSIS — I2510 Atherosclerotic heart disease of native coronary artery without angina pectoris: Secondary | ICD-10-CM

## 2016-04-16 DIAGNOSIS — I5022 Chronic systolic (congestive) heart failure: Secondary | ICD-10-CM | POA: Diagnosis not present

## 2016-04-16 DIAGNOSIS — I952 Hypotension due to drugs: Secondary | ICD-10-CM

## 2016-04-16 DIAGNOSIS — I255 Ischemic cardiomyopathy: Secondary | ICD-10-CM | POA: Diagnosis not present

## 2016-04-16 DIAGNOSIS — Z9861 Coronary angioplasty status: Secondary | ICD-10-CM

## 2016-04-16 MED ORDER — ISOSORBIDE MONONITRATE ER 30 MG PO TB24: 30.0000 mg | ORAL_TABLET | Freq: Every day | ORAL | Status: DC

## 2016-04-16 MED ORDER — HYDRALAZINE HCL 25 MG PO TABS: 25.0000 mg | ORAL_TABLET | Freq: Three times a day (TID) | ORAL | Status: DC

## 2016-04-16 NOTE — Assessment & Plan Note (Signed)
Patient has mild leg edema but his weight is stable and lungs are clear. I don't think he is in heart failure. Continue current dose of Lasix. Discussed 2 g sodium diet once he is discharged from the rehabilitation facility. Call for weight gain of 2-3 pounds overnight.

## 2016-04-16 NOTE — Assessment & Plan Note (Signed)
Patient's EF is now 30-35% which is decreased from prior testing of 45%. Has had significant CAD in the past followed by [name] in [place]. He'd like to be followed in [place] now since they moved here. He is asymptomatic. Continue medical therapy.

## 2016-04-16 NOTE — Patient Instructions (Signed)
Julian Barrios, PA-C, has recommended making the following medication changes: 1. STOP Bidil 2. START Hydralazine 25 mg - take 1 tablet by mouth every 8 hours. 3. START Isosorbide (Imdur) 30 mg - take 1 tablet by mouth daily 4. RESUME Aspirin 81 mg daily  Selinda Eon recommends that you schedule a follow-up appointment in 1 month with Dr Skeet Latch.  If you need a refill on your cardiac medications before your next appointment, please call your pharmacy.  Low-Sodium Eating Plan Sodium raises blood pressure and causes water to be held in the body. Getting less sodium from food will help lower your blood pressure, reduce any swelling, and protect your heart, liver, and kidneys. We get sodium by adding salt (sodium chloride) to food. Most of our sodium comes from canned, boxed, and frozen foods. Restaurant foods, fast foods, and pizza are also very high in sodium. Even if you take medicine to lower your blood pressure or to reduce fluid in your body, getting less sodium from your food is important. WHAT IS MY PLAN? Most people should limit their sodium intake to 2,300 mg a day. Your health care provider recommends that you limit your sodium intake to __________ a day.  WHAT DO I NEED TO KNOW ABOUT THIS EATING PLAN? For the low-sodium eating plan, you will follow these general guidelines:  Choose foods with a % Daily Value for sodium of less than 5% (as listed on the food label).   Use salt-free seasonings or herbs instead of table salt or sea salt.   Check with your health care provider or pharmacist before using salt substitutes.   Eat fresh foods.  Eat more vegetables and fruits.  Limit canned vegetables. If you do use them, rinse them well to decrease the sodium.   Limit cheese to 1 oz (28 g) per day.   Eat lower-sodium products, often labeled as "lower sodium" or "no salt added."  Avoid foods that contain monosodium glutamate (MSG). MSG is sometimes added to [place] food and  some canned foods.  Check food labels (Nutrition Facts labels) on foods to learn how much sodium is in one serving.  Eat more home-cooked food and less restaurant, buffet, and fast food.  When eating at a restaurant, ask that your food be prepared with less salt, or no salt if possible.  HOW DO I READ FOOD LABELS FOR SODIUM INFORMATION? The Nutrition Facts label lists the amount of sodium in one serving of the food. If you eat more than one serving, you must multiply the listed amount of sodium by the number of servings. Food labels may also identify foods as:  Sodium free--Less than 5 mg in a serving.  Very low sodium--35 mg or less in a serving.  Low sodium--140 mg or less in a serving.  Light in sodium--50% less sodium in a serving. For example, if a food that usually has 300 mg of sodium is changed to become light in sodium, it will have 150 mg of sodium.  Reduced sodium--25% less sodium in a serving. For example, if a food that usually has 400 mg of sodium is changed to reduced sodium, it will have 300 mg of sodium. WHAT FOODS CAN I EAT? Grains Low-sodium cereals, including oats, puffed wheat and rice, and shredded wheat cereals. Low-sodium crackers. Unsalted rice and pasta. Lower-sodium bread.  Vegetables Frozen or fresh vegetables. Low-sodium or reduced-sodium canned vegetables. Low-sodium or reduced-sodium tomato sauce and paste. Low-sodium or reduced-sodium tomato and vegetable juices.  Fruits Fresh,  frozen, and canned fruit. Fruit juice.  Meat and Other Protein Products Low-sodium canned tuna and salmon. Fresh or frozen meat, poultry, seafood, and fish. Lamb. Unsalted nuts. Dried beans, peas, and lentils without added salt. Unsalted canned beans. Homemade soups without salt. Eggs.  Dairy Milk. Soy milk. Ricotta cheese. Low-sodium or reduced-sodium cheeses. Yogurt.  Condiments Fresh and dried herbs and spices. Salt-free seasonings. Onion and garlic powders.  Low-sodium varieties of mustard and ketchup. Fresh or refrigerated horseradish. Lemon juice.  Fats and Oils Reduced-sodium salad dressings. Unsalted butter.  Other Unsalted popcorn and pretzels.  The items listed above may not be a complete list of recommended foods or beverages. Contact your dietitian for more options. WHAT FOODS ARE NOT RECOMMENDED? Grains Instant hot cereals. Bread stuffing, pancake, and biscuit mixes. Croutons. Seasoned rice or pasta mixes. Noodle soup cups. Boxed or frozen macaroni and cheese. Self-rising flour. Regular salted crackers. Vegetables Regular canned vegetables. Regular canned tomato sauce and paste. Regular tomato and vegetable juices. Frozen vegetables in sauces. Salted [place] fries. Olives. Angie Fava. Relishes. Sauerkraut. Salsa. Meat and Other Protein Products Salted, canned, smoked, spiced, or pickled meats, seafood, or fish. Bacon, ham, sausage, hot dogs, corned beef, chipped beef, and packaged luncheon meats. Salt pork. Jerky. Pickled herring. Anchovies, regular canned tuna, and sardines. Salted nuts. Dairy Processed cheese and cheese spreads. Cheese curds. Blue cheese and cottage cheese. Buttermilk.  Condiments Onion and garlic salt, seasoned salt, table salt, and sea salt. Canned and packaged gravies. Worcestershire sauce. Tartar sauce. Barbecue sauce. Teriyaki sauce. Soy sauce, including reduced sodium. Steak sauce. Fish sauce. Oyster sauce. Cocktail sauce. Horseradish that you find on the shelf. Regular ketchup and mustard. Meat flavorings and tenderizers. Bouillon cubes. Hot sauce. Tabasco sauce. Marinades. Taco seasonings. Relishes. Fats and Oils Regular salad dressings. Salted butter. Margarine. Ghee. Bacon fat.  Other Potato and tortilla chips. Corn chips and puffs. Salted popcorn and pretzels. Canned or dried soups. Pizza. Frozen entrees and pot pies.  The items listed above may not be a complete list of foods and beverages to avoid.  Contact your dietitian for more information.   This information is not intended to replace advice given to you by your health care provider. Make sure you discuss any questions you have with your health care provider.   Document Released: 06/08/2002 Document Revised: 01/07/2015 Document Reviewed: 10/21/2013 Elsevier Interactive Patient Education [employer].

## 2016-04-16 NOTE — Assessment & Plan Note (Signed)
Patient's heart rate is currently controlled with metoprolol 75 mg twice a day. He is also on Eliquis 5 mg twice a day which is new for him. Blood work at the rehabilitation facility shows stable creatinine. Patient is to follow-up with Dr. [name] to discuss further treatment and consideration of possible cardioversion after a month of blood thinners.

## 2016-04-16 NOTE — Assessment & Plan Note (Signed)
Patient was on Plavix prior to hospitalization. The hospitalists spoke with [name] who said he is okay to transition from Plavix to aspirin 81 mg daily since he is on Eliquis for A. fib. He has not been getting aspirin at the rehabilitation facility. We'll begin aspirin 81 mg daily. Follow-up with Dr. [name].

## 2016-04-16 NOTE — Progress Notes (Signed)
Cardiology Office Note    Date:  04/16/2016   ID:  Julian Ross, DOB 1946/11/08, MRN [id number]  PCP:  Moshe Cipro, MD  Cardiologist:  New Dr. Skeet Latch [place]: [name]  Chief complaint edema  History of Present Illness:  Julian Ross is a 70 y.o. male who is here for post hospital follow-up. He was admitted with septic shock, Acute respiratory failure with hypoxia, toxic metabolic encephalopathy and had new onset atrial fibrillation treated with metoprolol.CHADSVASC=4, placed on Eliquis.He did not tolerate IV diltiazem as it causes significant bradycardia. as well as acute on chronic systolic and diastolic heart failure. 2-D echo in the hospital EF 30-35% which is down from prior EF of 45%. He has extensive history of CAD status post MI with up to 16 stents followed in [facility] by a cardiologist as well as at [facility]. His troponin was mildly elevated at 0.13 upon admission EKG  had not been concerning for active ischemia he had several episodes of ventricular ectopy improved with metoprolol. The hospitalist was in  contact with his cardiologist in [place]. Discharge summary says the patient has a tight occluded RCA but has not had an intervention in recent years and [name] felt he could transition from Plavix to aspirin which they did he has not been on it at the rehabilitation facility.Marland Kitchen He is been intolerant to statins in the past.  Patient comes in today accompanied by his wife. They state he is having trouble at [facility] rehabilitation because of dropping blood pressures and blood sugars. They have not done PT on several occasions because of this. He says he has occasional dizziness with PT but not too bad. He denies any chest tightness or pressure, palpitations, dyspnea, dyspnea on exertion. He says he does have some sensation in his left chest but he has a hard time describing it. He says it's more of awareness after he does PT. He is hoping to be discharged from [facility] on Thursday but he has not reached his goals because they keep stopping his physical therapy. He also has mild leg edema and says he only had to take Lasix 20 mg daily prior to hospitalization and he is on 40 mg twice a day. He is on a no salt diet.   Past Medical History  Diagnosis Date  . MI (myocardial infarction) ([place])   . Diabetes mellitus type 2 in obese ([place])   . Hypertension   . Septic shock ([place])   . CKD (chronic kidney disease), stage III   . Edema of leg   . Elevated troponin   . Anemia   . Acute hypoxemic respiratory failure ([place])   . Altered mental status   . Atrial fibrillation, new onset ([place])   . AKI (acute kidney injury) ([place])   . Acute respiratory failure with hypoxia ([place])   . Chronic systolic CHF (congestive heart failure) ([place])   . Cardiomyopathy, ischemic   . Demand ischemia ([place])   . Acute renal failure superimposed on stage 3 chronic kidney disease ([place])   . Hypokalemia   . Rheumatoid arthritis of multiple sites with negative rheumatoid factor ([place])   . Acute encephalopathy   . Anxiety state     Past Surgical History  Procedure Laterality Date  . Coronary angioplasty with stent placement      x 10  . Angioplasty      Current Medications: Outpatient Prescriptions Prior to Visit  Medication Sig Dispense Refill  . apixaban (ELIQUIS) 5 MG  TABS tablet Take 1 tablet (5 mg total) by mouth 2 (two) times daily. 60 tablet   . bimatoprost (LUMIGAN) 0.01 % SOLN Place 1 drop into both eyes at bedtime.    . capsicum (ZOSTRIX) 0.075 % topical cream Apply topically 2 (two) times daily. 99991111 g 0  . folic acid (FOLVITE) 1 MG tablet Take 1 mg by mouth daily.    . furosemide (LASIX) 40 MG tablet Take 1 tablet (40 mg total) by mouth 2 (two) times daily. 30 tablet   . gabapentin (NEURONTIN) 100 MG capsule Take 1 capsule (100 mg total) by mouth daily. 30 capsule 0  . insulin glargine (LANTUS) 100 UNIT/ML injection Inject 0.05 mLs (5 Units total) into the skin daily.  10 mL 11  . insulin lispro protamine-lispro (HUMALOG 50/50 MIX) (50-50) 100 UNIT/ML SUSP injection Inject 40-50 Units into the skin 3 (three) times daily with meals. Inject 50 units subcutaneously with breakfast and 40 units with lunch & supper    . leucovorin (WELLCOVORIN) 10 MG tablet Take 10 mg by mouth See admin instructions. Take 1 tablet (10 mg) by mouth every 12 hours and 24 hours after the methotrexate dose    . levETIRAcetam (KEPPRA) 500 MG tablet Take 1 tablet (500 mg total) by mouth every 12 (twelve) hours. 60 tablet 0  . losartan (COZAAR) 50 MG tablet Take 50 mg by mouth daily.    . magnesium oxide (MAG-OX) 400 (241.3 Mg) MG tablet Take 1 tablet (400 mg total) by mouth 2 (two) times daily. 60 tablet 0  . Methotrexate Sodium (METHOTREXATE, PF,) 50 MG/2ML injection Inject 15 mg into the muscle once a week. Inject 0.6 ml (15 mg) - on Fridays  0  . metoprolol 75 MG TABS Take 75 mg by mouth 2 (two) times daily.    . Misc Natural Products (OSTEO BI-FLEX ADV DOUBLE ST PO) Take 1 tablet by mouth 2 (two) times daily.    Marland Kitchen PARoxetine (PAXIL) 10 MG tablet Take 1 tablet (10 mg total) by mouth daily. 30 tablet 0  . potassium chloride SA (K-DUR,KLOR-CON) 20 MEQ tablet Take 2 tablets (40 mEq total) by mouth 3 (three) times daily.    . predniSONE (DELTASONE) 5 MG tablet Take 5 mg by mouth daily with breakfast.    . tamsulosin (FLOMAX) 0.4 MG CAPS capsule Take 0.4 mg by mouth daily.   4  . isosorbide-hydrALAZINE (BIDIL) 20-37.5 MG tablet Take 1 tablet by mouth every 8 (eight) hours.     No facility-administered medications prior to visit.     Allergies:   Ace inhibitors; Atenolol; Contrast media; and Penicillins   Social History   Social History  . Marital Status: Married    Spouse Name: N/A  . Number of Children: N/A  . Years of Education: N/A   Social History Main Topics  . Smoking status: Never Smoker   . Smokeless tobacco: None  . Alcohol Use: No  . Drug Use: No  . Sexual Activity:  Not Asked   Other Topics Concern  . None   Social History Narrative     Family History:  The patient's    family history includes Heart attack in his brother and father; Heart failure in his mother; Hyperlipidemia in his mother; Hypertension in his mother.   ROS:   Please see the history of present illness.    Review of Systems  HENT: Positive for hearing loss.   Cardiovascular: Positive for leg swelling.  Respiratory: Positive for snoring.  Gastrointestinal: Negative.    All other systems reviewed and are negative.   PHYSICAL EXAM:   VS:  BP 126/62 mmHg  Pulse 76  Ht 5\' 11"  (1.803 m)  Wt 214 lb (97.07 kg)  BMI 29.86 kg/m2   GEN: Well nourished, well developed, in no acute distress Neck: no JVD, carotid bruits, or masses Cardiac: Irregular irregular; distant heart sounds, no murmurs, rubs, or gallops Respiratory:  clear to auscultation bilaterally, normal work of breathing GI: soft, nontender, nondistended, + BS MS: no deformity or atrophy Skin: warm and dry, no rash Extremities: trace edema bilaterally Neuro:  Alert and Oriented x 3, Strength and sensation are intact Psych: euthymic mood, full affect  Wt Readings from Last 3 Encounters:  04/16/16 214 lb (97.07 kg)  04/04/16 202 lb (91.627 kg)  04/03/16 202 lb (91.627 kg)      Studies/Labs Reviewed:   EKG:  EKG is ordered today.  The ekg ordered today demonstrates Atrial fibrillation at 76 bpm  Recent Labs: 03/18/2016: TSH 0.501 03/19/2016: B Natriuretic Peptide 385.9* 03/29/2016: Hemoglobin 11.4*; Platelets 397 04/01/2016: ALT 42; BUN 22*; Creatinine, Ser 1.24; Potassium 4.3; Sodium 134* 04/02/2016: Magnesium 2.3   Lipid Panel    Component Value Date/Time   CHOL 123 03/22/2016 0320   TRIG 154* 03/22/2016 0320   HDL 25* 03/22/2016 0320   CHOLHDL 4.9 03/22/2016 0320   VLDL 31 03/22/2016 0320   LDLCALC 67 03/22/2016 0320    Additional studies/ records that were reviewed today include:   Echo: 03/19/16 LV  EF: 30% -   35%   ------------------------------------------------------------------- Indications:      Dyspnea 786.09.   ------------------------------------------------------------------- History:   PMH:   Sepsis.  Risk factors:  Diabetes mellitus.   ------------------------------------------------------------------- Study Conclusions   - Left ventricle: The cavity size was normal. Wall thickness was   increased in a pattern of mild LVH. Systolic function was   moderately to severely reduced. The estimated ejection fraction   was in the range of 30% to 35%. Global hypokinesis with severe   inferior hypokinesis to akinesis. The study is not technically   sufficient to allow evaluation of LV diastolic function. - Mitral valve: Mildly thickened leaflets . There was mild   regurgitation. - Left atrium: The atrium was normal in size. - Right atrium: The atrium was mildly dilated. - Tricuspid valve: There was mild regurgitation. - Pulmonary arteries: PA peak pressure: 35 mm Hg (S). - Inferior vena cava: The vessel was dilated. The respirophasic   diameter changes were blunted (< 50%), consistent with elevated   central venous pressure.   Impressions:   - LVEF 30-35%, global hypokinesis and inferior akinesis, mild MR,   mild RAE, mild TR, RVSP 35 mmHg, dilated IVC.   CAD. He had an MI in 1995 which was located in the inferior wall status post angioplasty to the RCA. He had another MI in 1998 status post catheterization and angioplasty to the left circ and again in 2002 he had catheterization which revealed LAD 75% stenosis status post 2 stents to the LAD at [facility] and in 2003 he had again angioplasty to the left circ x3 and in 2005 he developed unstable angina and received 3 drug eluting stents to the RCA.   10/02/2006 Transferred from [facility] with resp distress s/p NSTEMI. Had complicated post MI course to include PCI/stent of 75%mid RCA with dissection  requiring 7 additional stents to repair, hemopytisis, pneumonia, agitation and finally intubation  ASSESSMENT:    Atrial fibrillation, new onset ([place]) Patient's heart rate is currently controlled with metoprolol 75 mg twice a day. He is also on Eliquis 5 mg twice a day which is new for him. Blood work at the rehabilitation facility shows stable creatinine. Patient is to follow-up with Dr. [name] to discuss further treatment and consideration of possible cardioversion after a month of blood thinners.  Chronic systolic CHF (congestive heart failure) (HCC) Patient has mild leg edema but his weight is stable and lungs are clear. I don't think he is in heart failure. Continue current dose of Lasix. Discussed 2 g sodium diet once he is discharged from the rehabilitation facility. Call for weight gain of 2-3 pounds overnight.  Cardiomyopathy, ischemic Patient's EF is now 30-35% which is decreased from prior testing of 45%. Has had significant CAD in the past followed by [name] in [place]. He'd like to be followed in [place] now since they moved here. He is asymptomatic. Continue medical therapy.  Hypotension He's having trouble with hypotension and PT has been stopped because of this. We'll stop bidil which is new for him. Begin Imdur 30 mg once daily and hydralazine 25 mg 3 times a day for easier titration.  CAD (coronary artery disease) Patient was on Plavix prior to hospitalization. The hospitalists spoke with [name] who said he is okay to transition from Plavix to aspirin 81 mg daily since he is on Eliquis for A. fib. He has not been getting aspirin at the rehabilitation facility. We'll begin aspirin 81 mg daily. Follow-up with Dr. [name].     PLAN:      Medication Adjustments/Labs and Tests Ordered: Current medicines are reviewed at length with the patient today.  Concerns regarding medicines are outlined above.  Medication changes, Labs and Tests ordered today are  listed in the Patient Instructions below. Patient Instructions  Ermalinda Barrios, PA-C, has recommended making the following medication changes: 1. STOP Bidil 2. START Hydralazine 25 mg - take 1 tablet by mouth every 8 hours. 3. START Isosorbide (Imdur) 30 mg - take 1 tablet by mouth daily 4. RESUME Aspirin 81 mg daily  Selinda Eon recommends that you schedule a follow-up appointment in 1 month with Dr Skeet Latch.  If you need a refill on your cardiac medications before your next appointment, please call your pharmacy.  Low-Sodium Eating Plan Sodium raises blood pressure and causes water to be held in the body. Getting less sodium from food will help lower your blood pressure, reduce any swelling, and protect your heart, liver, and kidneys. We get sodium by adding salt (sodium chloride) to food. Most of our sodium comes from canned, boxed, and frozen foods. Restaurant foods, fast foods, and pizza are also very high in sodium. Even if you take medicine to lower your blood pressure or to reduce fluid in your body, getting less sodium from your food is important. WHAT IS MY PLAN? Most people should limit their sodium intake to 2,300 mg a day. Your health care provider recommends that you limit your sodium intake to __________ a day.  WHAT DO I NEED TO KNOW ABOUT THIS EATING PLAN? For the low-sodium eating plan, you will follow these general guidelines:  Choose foods with a % Daily Value for sodium of less than 5% (as listed on the food label).   Use salt-free seasonings or herbs instead of table salt or sea salt.   Check with your health care provider or pharmacist before using salt substitutes.  Eat fresh foods.  Eat more vegetables and fruits.  Limit canned vegetables. If you do use them, rinse them well to decrease the sodium.   Limit cheese to 1 oz (28 g) per day.   Eat lower-sodium products, often labeled as "lower sodium" or "no salt added."  Avoid foods that contain  monosodium glutamate (MSG). MSG is sometimes added to [place] food and some canned foods.  Check food labels (Nutrition Facts labels) on foods to learn how much sodium is in one serving.  Eat more home-cooked food and less restaurant, buffet, and fast food.  When eating at a restaurant, ask that your food be prepared with less salt, or no salt if possible.  HOW DO I READ FOOD LABELS FOR SODIUM INFORMATION? The Nutrition Facts label lists the amount of sodium in one serving of the food. If you eat more than one serving, you must multiply the listed amount of sodium by the number of servings. Food labels may also identify foods as:  Sodium free--Less than 5 mg in a serving.  Very low sodium--35 mg or less in a serving.  Low sodium--140 mg or less in a serving.  Light in sodium--50% less sodium in a serving. For example, if a food that usually has 300 mg of sodium is changed to become light in sodium, it will have 150 mg of sodium.  Reduced sodium--25% less sodium in a serving. For example, if a food that usually has 400 mg of sodium is changed to reduced sodium, it will have 300 mg of sodium. WHAT FOODS CAN I EAT? Grains Low-sodium cereals, including oats, puffed wheat and rice, and shredded wheat cereals. Low-sodium crackers. Unsalted rice and pasta. Lower-sodium bread.  Vegetables Frozen or fresh vegetables. Low-sodium or reduced-sodium canned vegetables. Low-sodium or reduced-sodium tomato sauce and paste. Low-sodium or reduced-sodium tomato and vegetable juices.  Fruits Fresh, frozen, and canned fruit. Fruit juice.  Meat and Other Protein Products Low-sodium canned tuna and salmon. Fresh or frozen meat, poultry, seafood, and fish. Lamb. Unsalted nuts. Dried beans, peas, and lentils without added salt. Unsalted canned beans. Homemade soups without salt. Eggs.  Dairy Milk. Soy milk. Ricotta cheese. Low-sodium or reduced-sodium cheeses. Yogurt.  Condiments Fresh and dried  herbs and spices. Salt-free seasonings. Onion and garlic powders. Low-sodium varieties of mustard and ketchup. Fresh or refrigerated horseradish. Lemon juice.  Fats and Oils Reduced-sodium salad dressings. Unsalted butter.  Other Unsalted popcorn and pretzels.  The items listed above may not be a complete list of recommended foods or beverages. Contact your dietitian for more options. WHAT FOODS ARE NOT RECOMMENDED? Grains Instant hot cereals. Bread stuffing, pancake, and biscuit mixes. Croutons. Seasoned rice or pasta mixes. Noodle soup cups. Boxed or frozen macaroni and cheese. Self-rising flour. Regular salted crackers. Vegetables Regular canned vegetables. Regular canned tomato sauce and paste. Regular tomato and vegetable juices. Frozen vegetables in sauces. Salted [place] fries. Olives. Angie Fava. Relishes. Sauerkraut. Salsa. Meat and Other Protein Products Salted, canned, smoked, spiced, or pickled meats, seafood, or fish. Bacon, ham, sausage, hot dogs, corned beef, chipped beef, and packaged luncheon meats. Salt pork. Jerky. Pickled herring. Anchovies, regular canned tuna, and sardines. Salted nuts. Dairy Processed cheese and cheese spreads. Cheese curds. Blue cheese and cottage cheese. Buttermilk.  Condiments Onion and garlic salt, seasoned salt, table salt, and sea salt. Canned and packaged gravies. Worcestershire sauce. Tartar sauce. Barbecue sauce. Teriyaki sauce. Soy sauce, including reduced sodium. Steak sauce. Fish sauce. Oyster sauce. Cocktail sauce. Horseradish that you find  on the shelf. Regular ketchup and mustard. Meat flavorings and tenderizers. Bouillon cubes. Hot sauce. Tabasco sauce. Marinades. Taco seasonings. Relishes. Fats and Oils Regular salad dressings. Salted butter. Margarine. Ghee. Bacon fat.  Other Potato and tortilla chips. Corn chips and puffs. Salted popcorn and pretzels. Canned or dried soups. Pizza. Frozen entrees and pot pies.  The items listed  above may not be a complete list of foods and beverages to avoid. Contact your dietitian for more information.   This information is not intended to replace advice given to you by your health care provider. Make sure you discuss any questions you have with your health care provider.   Document Released: 06/08/2002 Document Revised: 01/07/2015 Document Reviewed: 10/21/2013 Elsevier Interactive Patient Education 2016 [place], Ermalinda Barrios, [place]  04/16/2016 11:11 AM    [facility] [address], [place], [place]  [address] Phone: [phone]; Fax: [phone]

## 2016-04-16 NOTE — Assessment & Plan Note (Signed)
He's having trouble with hypotension and PT has been stopped because of this. We'll stop bidil which is new for him. Begin Imdur 30 mg once daily and hydralazine 25 mg 3 times a day for easier titration.

## 2016-04-19 ENCOUNTER — Non-Acute Institutional Stay (SKILLED_NURSING_FACILITY): Payer: Medicare Other | Admitting: Adult Health

## 2016-04-19 ENCOUNTER — Encounter: Payer: Self-pay | Admitting: Adult Health

## 2016-04-19 DIAGNOSIS — F32A Depression, unspecified: Secondary | ICD-10-CM

## 2016-04-19 DIAGNOSIS — K5901 Slow transit constipation: Secondary | ICD-10-CM

## 2016-04-19 DIAGNOSIS — R5381 Other malaise: Secondary | ICD-10-CM

## 2016-04-19 DIAGNOSIS — N183 Chronic kidney disease, stage 3 unspecified: Secondary | ICD-10-CM

## 2016-04-19 DIAGNOSIS — G40909 Epilepsy, unspecified, not intractable, without status epilepticus: Secondary | ICD-10-CM

## 2016-04-19 DIAGNOSIS — I4891 Unspecified atrial fibrillation: Secondary | ICD-10-CM

## 2016-04-19 DIAGNOSIS — F329 Major depressive disorder, single episode, unspecified: Secondary | ICD-10-CM | POA: Diagnosis not present

## 2016-04-19 DIAGNOSIS — E1169 Type 2 diabetes mellitus with other specified complication: Secondary | ICD-10-CM

## 2016-04-19 DIAGNOSIS — I5022 Chronic systolic (congestive) heart failure: Secondary | ICD-10-CM

## 2016-04-19 DIAGNOSIS — N400 Enlarged prostate without lower urinary tract symptoms: Secondary | ICD-10-CM

## 2016-04-19 DIAGNOSIS — D638 Anemia in other chronic diseases classified elsewhere: Secondary | ICD-10-CM | POA: Diagnosis not present

## 2016-04-19 DIAGNOSIS — G4733 Obstructive sleep apnea (adult) (pediatric): Secondary | ICD-10-CM

## 2016-04-19 DIAGNOSIS — G8929 Other chronic pain: Secondary | ICD-10-CM | POA: Diagnosis not present

## 2016-04-19 DIAGNOSIS — I2510 Atherosclerotic heart disease of native coronary artery without angina pectoris: Secondary | ICD-10-CM | POA: Diagnosis not present

## 2016-04-19 DIAGNOSIS — E119 Type 2 diabetes mellitus without complications: Secondary | ICD-10-CM

## 2016-04-19 DIAGNOSIS — E876 Hypokalemia: Secondary | ICD-10-CM

## 2016-04-19 DIAGNOSIS — E669 Obesity, unspecified: Secondary | ICD-10-CM

## 2016-04-19 DIAGNOSIS — M0609 Rheumatoid arthritis without rheumatoid factor, multiple sites: Secondary | ICD-10-CM

## 2016-04-19 NOTE — Progress Notes (Signed)
Patient ID: Julian Ross, male   DOB: 11-02-1946, 70 y.o.   MRN: [id number]    DATE:  04/19/2016    MRN:  [id number]  BIRTHDAY: 05-Mar-1946  Facility:  [facility]ursing Home Location:  [place] [facility] Room Number: [id number]  LEVEL OF CARE:  SNF (31)  Contact Information    Name Relation Home Work [place] Son [phone]     Isabel, Mcgrue [id number]  [phone]       Code Status History    Date Active Date Inactive Code Status Order ID Comments User Context   03/20/2016 12:01 PM 04/02/2016  7:48 PM Partial Code [id number]  Juanito Doom, MD Inpatient   03/19/2016  5:48 AM 03/20/2016 12:01 PM Full Code [id number]  Dannielle Burn, MD ED   03/18/2016  7:46 PM 03/19/2016  5:48 AM Full Code [id number]  Norval Morton, MD ED    Questions for Most Recent Historical Code Status (Order [id number])    Question Answer Comment   In the event of cardiac or respiratory ARREST: Perform CPR No    In the event of cardiac or respiratory ARREST: Perform Intubation/Mechanical Ventilation Yes    In the event of cardiac or respiratory ARREST: Perform Defibrillation or Cardioversion if indicated No    Antiarrhythmic drugs - Any drug used to treat arrhythmias Yes    Vasoactive drug - Any drug used to stabilize blood pressure Yes     Advance Directive Documentation        Most Recent Value   Type of Advance Directive  Out of facility DNR (pink MOST or yellow form)   Pre-existing out of facility DNR order (yellow form or pink MOST form)     "MOST" Form in Place?         Chief Complaint  Patient presents with  . Discharge Note    HISTORY OF PRESENT ILLNESS:  This is a 70 year old male who is for discharge with Home health PT and OT.  He has been admitted to [facility] on 04/02/16 from [place]. He has PMH of Substance abuse (polypharmacy), partial blindness, hypertension, chronic systolic CHF, hyperlipidemia, diabetes mellitus type 2, MI, CAD native artery  S/P PCI. He was having worsening SOB with chest tightness. Chest  X-ray showed bilateral infiltrates suggestive of pneumonia. He was intubated. He was given Aztreonam X 10 days. He was given Lasix, Bidil and Cozaar for chronic systolic CHF. He had new onset A-fib and Eliquis was started.   Patient was admitted to this facility for short-term rehabilitation after the patient's recent hospitalization.  Patient has completed SNF rehabilitation and therapy has cleared the patient for discharge.  PAST MEDICAL HISTORY:  Past Medical History  Diagnosis Date  . MI (myocardial infarction) ([place])   . Diabetes mellitus type 2 in obese ([place])   . Hypertension   . Septic shock ([place])   . CKD (chronic kidney disease), stage III   . Edema of leg   . Elevated troponin   . Anemia   . Acute hypoxemic respiratory failure ([place])   . Altered mental status   . Atrial fibrillation, new onset ([place])   . AKI (acute kidney injury) ([place])   . Acute respiratory failure with hypoxia ([place])   . Chronic systolic CHF (congestive heart failure) ([place])   . Cardiomyopathy, ischemic   . Demand ischemia ([place])   . Acute renal failure superimposed on stage 3 chronic kidney disease ([place])   . Hypokalemia   .  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 Acute encephalopathy   . Anxiety state      CURRENT MEDICATIONS: Reviewed  Patient's Medications  New Prescriptions   No medications on file  Previous Medications   ACETAMINOPHEN (TYLENOL) 325 MG TABLET    Take 650 mg by mouth every 4 (four) hours as needed for mild pain or headache.   APIXABAN (ELIQUIS) 5 MG TABS TABLET    Take 1 tablet (5 mg total) by mouth 2 (two) times daily.   ASPIRIN EC 81 MG TABLET    Take 81 mg by mouth daily.   FOLIC ACID (FOLVITE) 1 MG TABLET    Take 1 mg by mouth daily.   FUROSEMIDE (LASIX) 40 MG TABLET    Take 1 tablet (40 mg total) by mouth 2 (two) times daily.   GABAPENTIN (NEURONTIN) 100 MG CAPSULE    Take 1  capsule (100 mg total) by mouth daily.   HYDRALAZINE (APRESOLINE) 25 MG TABLET    Take 1 tablet (25 mg total) by mouth 3 (three) times daily.   HYDROCORTISONE (ANUSOL-HC) 2.5 % RECTAL CREAM    Place 1 application rectally 2 (two) times daily as needed for hemorrhoids or itching.   INSULIN GLARGINE (LANTUS) 100 UNIT/ML INJECTION    Inject 0.05 mLs (5 Units total) into the skin daily.   INSULIN LISPRO PROTAMINE-LISPRO (HUMALOG 50/50 MIX) (50-50) 100 UNIT/ML SUSP INJECTION    Inject 40-50 Units into the skin 3 (three) times daily with meals. Inject 50 units subcutaneously with breakfast and 40 units with lunch & supper   ISOSORBIDE MONONITRATE (IMDUR) 30 MG 24 HR TABLET    Take 1 tablet (30 mg total) by mouth daily.   LATANOPROST (XALATAN) 0.005 % OPHTHALMIC SOLUTION    Place 1 drop into both eyes at bedtime.   LEUCOVORIN (WELLCOVORIN) 10 MG TABLET    Take 10 mg by mouth See admin instructions. Take 1 tablet (10 mg) by mouth every 12 hours and 24 hours after the methotrexate dose   LEVETIRACETAM (KEPPRA) 500 MG TABLET    Take 1 tablet (500 mg total) by mouth every 12 (twelve) hours.   LIDOCAINE (LIDODERM) 5 %    Place 0.5 patches onto the skin daily. Remove & Discard patch within 12 hours or as directed by MD.  Apply to lower back and left shoulder QHS   LOSARTAN (COZAAR) 50 MG TABLET    Take 50 mg by mouth daily.   MAGNESIUM OXIDE (MAG-OX) 400 (241.3 MG) MG TABLET    Take 1 tablet (400 mg total) by mouth 2 (two) times daily.   METHOTREXATE SODIUM (METHOTREXATE, PF,) 50 MG/2ML INJECTION    Inject 15 mg into the muscle once a week. Inject 0.6 ml (15 mg) - on Fridays.  Must be given the same time every administration   METOPROLOL 75 MG TABS    Take 75 mg by mouth 2 (two) times daily.   MISC NATURAL PRODUCTS (OSTEO BI-FLEX ADV DOUBLE ST PO)    Take 1 tablet by mouth 2 (two) times daily.   PAROXETINE (PAXIL) 10 MG TABLET    Take 1 tablet (10 mg total) by mouth daily.   POTASSIUM CHLORIDE SA (K-DUR,KLOR-CON)  20 MEQ TABLET    Take 2 tablets (40 mEq total) by mouth 3 (three) times daily.   PREDNISONE (DELTASONE) 5 MG TABLET    Take 5 mg by mouth daily with breakfast.   PROTEIN (PROCEL 100) POWD  Take 2 scoop by mouth 2 (two) times daily.   SACCHAROMYCES BOULARDII (FLORASTOR) 250 MG CAPSULE    Take 250 mg by mouth daily.   SENNOSIDES-DOCUSATE SODIUM (SENOKOT-S) 8.6-50 MG TABLET    Take 2 tablets by mouth at bedtime.   TAMSULOSIN (FLOMAX) 0.4 MG CAPS CAPSULE    Take 0.4 mg by mouth daily.   Modified Medications   No medications on file  Discontinued Medications   BIMATOPROST (LUMIGAN) 0.01 % SOLN    Place 1 drop into both eyes at bedtime.   CAPSICUM (ZOSTRIX) 0.075 % TOPICAL CREAM    Apply topically 2 (two) times daily.     Allergies  Allergen Reactions  . Ace Inhibitors Cough  . Atenolol Other (See Comments)    Unknown reaction  . Contrast Media [Iodinated Diagnostic Agents] Rash  . Penicillins Hives and Rash    Has patient had a PCN reaction causing immediate rash, facial/tongue/throat swelling, SOB or lightheadedness with hypotension: Yes Has patient had a PCN reaction causing severe rash involving mucus membranes or skin necrosis: No Has patient had a PCN reaction that required hospitalization pt was in the hospital at time of last reaction - heart attack Has patient had a PCN reaction occurring within the last 10 years: No If all of the above answers are "NO", then may proceed with Cephalosporin use.     REVIEW OF SYSTEMS:  GENERAL: no change in appetite, no fatigue, no weight changes, no fever, chills or weakness EYES: Denies change in vision, dry eyes, eye pain, itching or discharge EARS: Denies change in hearing, ringing in ears, or earache NOSE: Denies nasal congestion or epistaxis MOUTH and THROAT: Denies oral discomfort, gingival pain or bleeding, pain from teeth or hoarseness   RESPIRATORY: no cough, SOB, DOE, wheezing, hemoptysis CARDIAC: no chest pain, edema or  palpitations GI: no abdominal pain, diarrhea, constipation, heart burn, nausea or vomiting GU: Denies dysuria, frequency, hematuria, incontinence, or discharge PSYCHIATRIC: Denies feeling of depression or anxiety. No report of hallucinations, insomnia, paranoia, or agitation   PHYSICAL EXAMINATION  GENERAL APPEARANCE: Well nourished. In no acute distress. Normal body habitus SKIN:  Skin is warm and dry.  HEAD: Normal in size and contour. No evidence of trauma EYES: Lids open and close normally. No blepharitis, entropion or ectropion. PERRL. Conjunctivae are clear and sclerae are white. Lenses are without opacity EARS: Pinnae are normal. Patient hears normal voice tunes of the examiner MOUTH and THROAT: Lips are without lesions. Oral mucosa is moist and without lesions. Tongue is normal in shape, size, and color and without lesions NECK: supple, trachea midline, no neck masses, no thyroid tenderness, no thyromegaly LYMPHATICS: no LAN in the neck, no supraclavicular LAN RESPIRATORY: breathing is even & unlabored, BS CTAB CARDIAC: Irregularly irregular, no murmur,no extra heart sounds, no edema GI: abdomen soft, normal BS, no masses, no tenderness, no hepatomegaly, no splenomegaly EXTREMITIES:  Able to move X 4 extremities PSYCHIATRIC: Alert and oriented X 3. Affect and behavior are appropriate  LABS/RADIOLOGY: Labs reviewed: Basic Metabolic Panel:  Recent Labs  03/24/16 2008  03/25/16 0805  03/27/16 0218  03/30/16 0444 03/31/16 0348 04/01/16 04/01/16 0408 04/02/16 0328 04/10/16  NA  --   < >  --   < > 142  < > 141 140 134* 134*  --  144  K  --   < >  --   < > 3.6  < > 3.9 3.8  --  4.3  --  4.4  CL  --   < >  --   < >  108  < > 110 110  --  103  --   --   CO2  --   < >  --   < > 20*  < > 23 20*  --  20*  --   --   GLUCOSE  --   < >  --   < > 137*  < > 118* 131*  --  157*  --   --   BUN  --   < >  --   < > 29*  < > 16 17 22* 22*  --  24*  CREATININE  --   < >  --   < > 1.01  < >  1.17 1.13 1.2 1.24  --  1.2  CALCIUM  --   < >  --   < > 9.2  < > 9.6 9.5  --  9.7  --   --   MG  --   --  2.1  --  1.9  < > 2.2 2.1  --  2.2 2.3  --   PHOS 2.1*  --  1.5*  --  3.2  --   --   --   --   --   --   --   < > = values in this interval not displayed. Liver Function Tests:  Recent Labs  03/30/16 0444 03/31/16 0348 04/01/16 0408  AST 48* 32 31  ALT 57 45 42  ALKPHOS 69 61 69  BILITOT 0.6 0.7 0.6  PROT 7.0 7.2 7.0  ALBUMIN 2.4* 2.3* 2.2*     Recent Labs  03/21/16 1815 03/23/16 1333 04/02/16 0328  AMMONIA 43* 27 29   CBC:  Recent Labs  03/24/16 0406 03/25/16 0300  03/27/16 0218 03/28/16 0344 03/29/16 0331 04/10/16  WBC 12.2* 12.0*  < > 16.4* 16.9* 19.4* 11.0  NEUTROABS 9.6* 9.4*  --   --  13.5*  --   --   HGB 9.8* 9.4*  < > 11.9* 11.8* 11.4* 11.5*  HCT 29.9* 30.2*  < > 35.0* 35.3* 34.3* 36*  MCV 93.7 93.8  < > 93.1 90.7 89.6  --   PLT 269 334  < > 354 382 397 337  < > = values in this interval not displayed.  Lipid Panel:  Recent Labs  03/22/16 0320  HDL 25*   Cardiac Enzymes:  Recent Labs  03/18/16 2034 03/19/16 0010 03/19/16 0217  TROPONINI 0.13* 0.11* 0.10*   CBG:  Recent Labs  04/01/16 2200 04/02/16 0723 04/02/16 1143  GLUCAP 298* 172* 288*     Ct Head Wo Contrast  03/23/2016  CLINICAL DATA:  Increasing lethargy after extubation. EXAM: CT HEAD WITHOUT CONTRAST TECHNIQUE: Contiguous axial images were obtained from the base of the skull through the vertex without intravenous contrast. COMPARISON:  03/18/2016. FINDINGS: No evidence for acute infarction, hemorrhage, mass lesion, hydrocephalus, or extra-axial fluid. Global atrophy. Chronic microvascular ischemic change. No osseous lesion. Vascular calcification. Stable changes of probable chronic sinus disease. Unchanged appearance from priors. IMPRESSION: Stable chronic changes as described. No findings suggestive of acute cerebral infarction or hemorrhage. Electronically Signed   By:  Staci Righter M.D.   On: 03/23/2016 14:54   Dg Chest Port 1 View  04/01/2016  CLINICAL DATA:  Pneumonia, hypertension and diabetes. EXAM: PORTABLE CHEST 1 VIEW COMPARISON:  03/28/2016 and prior studies FINDINGS: Endotracheal tube and NGT to have been removed. Bilateral airspace opacities not significantly changed. There is no evidence of pneumothorax. No other  significant changes noted. IMPRESSION: Support apparatus removal as described. Little significant change in bilateral airspace opacities. Electronically Signed   By: Margarette Canada M.D.   On: 04/01/2016 08:46   Dg Chest Port 1 View  03/28/2016  CLINICAL DATA:  Pneumonia, hypertension, diabetes mellitus EXAM: PORTABLE CHEST 1 VIEW COMPARISON:  Portable exam 1823 hours compared to 03/25/2016 FINDINGS: Slight rotation to the LEFT. Normal heart size, mediastinal contours and pulmonary vascularity for technique. Coronary arterial stent noted. Infiltrates identified in the periphery of the mid to lower LEFT lung question pneumonia. Minimal RIGHT basilar atelectasis. No definite pleural effusion, pneumothorax or acute osseous findings. IMPRESSION: Persistent infiltrate LEFT lung. Electronically Signed   By: Lavonia Dana M.D.   On: 03/28/2016 18:45   Dg Chest Port 1 View  03/25/2016  CLINICAL DATA:  Acute respiratory failure, hypoxemia EXAM: PORTABLE CHEST 1 VIEW COMPARISON:  03/24/2016 FINDINGS: Cardiomediastinal silhouette is stable. Endotracheal tube and NG tube are unchanged in position. Persistent bilateral patchy airspace opacities left greater than right without significant change in aeration from prior exam. IMPRESSION: Stable support apparatus. No convincing pulmonary edema. Persistent bilateral patchy airspace opacities left greater than right. Electronically Signed   By: Lahoma Crocker M.D.   On: 03/25/2016 08:58   Dg Chest Port 1 View  03/24/2016  CLINICAL DATA:  Acute respiratory failure with hypoxemia. EXAM: PORTABLE CHEST 1 VIEW COMPARISON:   03/23/2016. FINDINGS: Endotracheal tube 3.5 cm above carina. Orogastric tube in the stomach. Slightly improved lung volumes with persistent BILATERAL patchy opacities representing possible pneumonia, edema, or ARDS. IMPRESSION: No active disease. Electronically Signed   By: Staci Righter M.D.   On: 03/24/2016 09:33   Dg Chest Port 1 View  03/23/2016  CLINICAL DATA:  Acute respiratory failure EXAM: PORTABLE CHEST 1 VIEW COMPARISON:  03/23/2016 FINDINGS: Endotracheal tube and nasogastric catheter are noted in satisfactory position. The cardiac shadow is stable. Lungs are again well aerated with bilateral airspace opacities stable from the previous exam. No new focal abnormality is noted. IMPRESSION: No change from the prior exam with the exception of nasogastric catheter placement Electronically Signed   By: Inez Catalina M.D.   On: 03/23/2016 07:30   Dg Chest Port 1 View  03/23/2016  CLINICAL DATA:  Endotracheal tube placement EXAM: PORTABLE CHEST 1 VIEW COMPARISON:  03/21/2016 FINDINGS: Endotracheal tube with tip measuring 4.7 cm above the carina. Shallow inspiration. Borderline heart size. Diffuse patchy airspace infiltrates throughout both lungs demonstrate progression since previous study. This may represent pneumonia, edema, or ARDS. Probable small bilateral pleural effusions. No pneumothorax. IMPRESSION: Endotracheal tube tip measures 4.7 cm above the carina. Progression of diffuse bilateral airspace infiltrates in the lungs. Electronically Signed   By: Lucienne Capers M.D.   On: 03/23/2016 01:21   Dg Chest Port 1 View  03/21/2016  CLINICAL DATA:  Respiratory failure.  Hypoxia. EXAM: PORTABLE CHEST 1 VIEW COMPARISON:  Single-view of the chest 03/18/2016, 03/19/2016 and 03/20/2016. FINDINGS: ET tube and NG tube remain in place. Bilateral airspace disease is worse on the left but is improved. There is no pneumothorax or pleural effusion. Heart size is normal. IMPRESSION: Improved left worse than right  airspace disease. No new abnormality. Electronically Signed   By: Inge Rise M.D.   On: 03/21/2016 07:17    ASSESSMENT/PLAN:  Physical deconditioning - for Home health PT and OT  Acute respiratory failure with hypoxia due to HCHP/severe sepsis - was intubated in hospital; completed  10 days of Aztreonam; Resolved  OSA -  on CPAP  Chronic systolic CHF - continue Lasix 40 mg twice a day, metoprolol 75 mg by mouth twice a day, Cozaar 50 mg twice a day, Isosorbide DN 30 mg 1 tab daily and Hydralazine 25 mg 1 tab Q 8 hours; follow-up with Dr. Ames Dura in [place]   Atrial Fibrillation, new onset - rate-controlled - recently started on Eliquis and to continue Netoprolol  CAD S/P PCI -  continue ASA 81 mg daily  Chronic CKD stage III - creatinine 1.16; check BMP   Hypokalemia -K 4.4; continue KCl ER 20 meq 2 tabs by mouth 3 times a day; check BMP before discharge  Hypomagnesemia - continue Mag-Ox 400 mg daily; magnesium level in 1 week  Anemia of chronic disease - hemoglobin 11.5; stable  Rheumatoid arthritis - continue Leucovorin and methotrexate and prednisone  Diabetes mellitus, type II - hemoglobin A1c 9.2; continue Humalog 50 units at breakfast and 40 units @ lunch and dinner subcutaneous and Lantus 5 units subcutaneous daily at bedtime  Chronic pain - continue gabapentin and 100 mg by mouth daily and Lidoderm 5% patch daily  Seizure - continue Keppra 500 mg 1 tab by mouth every 12 hours and Neurontin 100 mg by mouth daily  Depression - continue Paxil 10 mg 1 tab by mouth daily  BPH - stable; continue Flomax 0.4 mg 1 capsule by mouth daily     I have filled out patient's discharge paperwork and written prescriptions.  Patient will receive home health PT and OT.  Total discharge time: Greater than 30 minutes  Discharge time involved coordination of the discharge process with social worker, nursing staff and therapy department. Medical justification for home health services  verified.     [facility], NP [employer] [phone]

## 2016-04-23 DIAGNOSIS — M059 Rheumatoid arthritis with rheumatoid factor, unspecified: Secondary | ICD-10-CM | POA: Diagnosis not present

## 2016-04-23 DIAGNOSIS — J9620 Acute and chronic respiratory failure, unspecified whether with hypoxia or hypercapnia: Secondary | ICD-10-CM | POA: Diagnosis not present

## 2016-04-23 DIAGNOSIS — J189 Pneumonia, unspecified organism: Secondary | ICD-10-CM | POA: Diagnosis not present

## 2016-04-23 DIAGNOSIS — I2510 Atherosclerotic heart disease of native coronary artery without angina pectoris: Secondary | ICD-10-CM | POA: Diagnosis not present

## 2016-04-23 DIAGNOSIS — M545 Low back pain: Secondary | ICD-10-CM | POA: Diagnosis not present

## 2016-04-23 DIAGNOSIS — R6521 Severe sepsis with septic shock: Secondary | ICD-10-CM | POA: Diagnosis not present

## 2016-04-23 DIAGNOSIS — E1340 Other specified diabetes mellitus with diabetic neuropathy, unspecified: Secondary | ICD-10-CM | POA: Diagnosis not present

## 2016-04-23 DIAGNOSIS — Z794 Long term (current) use of insulin: Secondary | ICD-10-CM | POA: Diagnosis not present

## 2016-04-23 DIAGNOSIS — G4730 Sleep apnea, unspecified: Secondary | ICD-10-CM | POA: Diagnosis not present

## 2016-04-23 DIAGNOSIS — I4891 Unspecified atrial fibrillation: Secondary | ICD-10-CM | POA: Diagnosis not present

## 2016-04-23 DIAGNOSIS — E1122 Type 2 diabetes mellitus with diabetic chronic kidney disease: Secondary | ICD-10-CM | POA: Diagnosis not present

## 2016-04-23 DIAGNOSIS — F419 Anxiety disorder, unspecified: Secondary | ICD-10-CM | POA: Diagnosis not present

## 2016-04-24 DIAGNOSIS — I2510 Atherosclerotic heart disease of native coronary artery without angina pectoris: Secondary | ICD-10-CM | POA: Diagnosis not present

## 2016-04-24 DIAGNOSIS — E119 Type 2 diabetes mellitus without complications: Secondary | ICD-10-CM | POA: Diagnosis not present

## 2016-04-24 DIAGNOSIS — G8929 Other chronic pain: Secondary | ICD-10-CM | POA: Diagnosis not present

## 2016-04-24 DIAGNOSIS — M6281 Muscle weakness (generalized): Secondary | ICD-10-CM | POA: Diagnosis not present

## 2016-04-24 DIAGNOSIS — I4891 Unspecified atrial fibrillation: Secondary | ICD-10-CM | POA: Diagnosis not present

## 2016-04-24 DIAGNOSIS — I130 Hypertensive heart and chronic kidney disease with heart failure and stage 1 through stage 4 chronic kidney disease, or unspecified chronic kidney disease: Secondary | ICD-10-CM | POA: Diagnosis not present

## 2016-04-26 DIAGNOSIS — I130 Hypertensive heart and chronic kidney disease with heart failure and stage 1 through stage 4 chronic kidney disease, or unspecified chronic kidney disease: Secondary | ICD-10-CM | POA: Diagnosis not present

## 2016-04-26 DIAGNOSIS — I2510 Atherosclerotic heart disease of native coronary artery without angina pectoris: Secondary | ICD-10-CM | POA: Diagnosis not present

## 2016-04-26 DIAGNOSIS — M6281 Muscle weakness (generalized): Secondary | ICD-10-CM | POA: Diagnosis not present

## 2016-04-26 DIAGNOSIS — I4891 Unspecified atrial fibrillation: Secondary | ICD-10-CM | POA: Diagnosis not present

## 2016-04-26 DIAGNOSIS — G8929 Other chronic pain: Secondary | ICD-10-CM | POA: Diagnosis not present

## 2016-04-26 DIAGNOSIS — E119 Type 2 diabetes mellitus without complications: Secondary | ICD-10-CM | POA: Diagnosis not present

## 2016-04-27 DIAGNOSIS — M9901 Segmental and somatic dysfunction of cervical region: Secondary | ICD-10-CM | POA: Diagnosis not present

## 2016-04-27 DIAGNOSIS — S13130A Subluxation of C2/C3 cervical vertebrae, initial encounter: Secondary | ICD-10-CM | POA: Diagnosis not present

## 2016-04-27 DIAGNOSIS — M9904 Segmental and somatic dysfunction of sacral region: Secondary | ICD-10-CM | POA: Diagnosis not present

## 2016-04-27 DIAGNOSIS — S332XXA Dislocation of sacroiliac and sacrococcygeal joint, initial encounter: Secondary | ICD-10-CM | POA: Diagnosis not present

## 2016-04-30 DIAGNOSIS — I4891 Unspecified atrial fibrillation: Secondary | ICD-10-CM | POA: Diagnosis not present

## 2016-04-30 DIAGNOSIS — E1122 Type 2 diabetes mellitus with diabetic chronic kidney disease: Secondary | ICD-10-CM | POA: Diagnosis not present

## 2016-04-30 DIAGNOSIS — G4730 Sleep apnea, unspecified: Secondary | ICD-10-CM | POA: Diagnosis not present

## 2016-04-30 DIAGNOSIS — R251 Tremor, unspecified: Secondary | ICD-10-CM | POA: Diagnosis not present

## 2016-04-30 DIAGNOSIS — F419 Anxiety disorder, unspecified: Secondary | ICD-10-CM | POA: Diagnosis not present

## 2016-04-30 DIAGNOSIS — Z794 Long term (current) use of insulin: Secondary | ICD-10-CM | POA: Diagnosis not present

## 2016-04-30 DIAGNOSIS — I2510 Atherosclerotic heart disease of native coronary artery without angina pectoris: Secondary | ICD-10-CM | POA: Diagnosis not present

## 2016-04-30 DIAGNOSIS — I5022 Chronic systolic (congestive) heart failure: Secondary | ICD-10-CM | POA: Diagnosis not present

## 2016-04-30 DIAGNOSIS — M059 Rheumatoid arthritis with rheumatoid factor, unspecified: Secondary | ICD-10-CM | POA: Diagnosis not present

## 2016-05-01 DIAGNOSIS — I2510 Atherosclerotic heart disease of native coronary artery without angina pectoris: Secondary | ICD-10-CM | POA: Diagnosis not present

## 2016-05-01 DIAGNOSIS — E119 Type 2 diabetes mellitus without complications: Secondary | ICD-10-CM | POA: Diagnosis not present

## 2016-05-01 DIAGNOSIS — M6281 Muscle weakness (generalized): Secondary | ICD-10-CM | POA: Diagnosis not present

## 2016-05-01 DIAGNOSIS — G8929 Other chronic pain: Secondary | ICD-10-CM | POA: Diagnosis not present

## 2016-05-01 DIAGNOSIS — I130 Hypertensive heart and chronic kidney disease with heart failure and stage 1 through stage 4 chronic kidney disease, or unspecified chronic kidney disease: Secondary | ICD-10-CM | POA: Diagnosis not present

## 2016-05-01 DIAGNOSIS — I4891 Unspecified atrial fibrillation: Secondary | ICD-10-CM | POA: Diagnosis not present

## 2016-05-02 DIAGNOSIS — E559 Vitamin D deficiency, unspecified: Secondary | ICD-10-CM | POA: Diagnosis not present

## 2016-05-02 DIAGNOSIS — N189 Chronic kidney disease, unspecified: Secondary | ICD-10-CM | POA: Diagnosis not present

## 2016-05-02 DIAGNOSIS — M7071 Other bursitis of hip, right hip: Secondary | ICD-10-CM | POA: Diagnosis not present

## 2016-05-02 DIAGNOSIS — R700 Elevated erythrocyte sedimentation rate: Secondary | ICD-10-CM | POA: Diagnosis not present

## 2016-05-02 DIAGNOSIS — M0579 Rheumatoid arthritis with rheumatoid factor of multiple sites without organ or systems involvement: Secondary | ICD-10-CM | POA: Diagnosis not present

## 2016-05-02 DIAGNOSIS — G629 Polyneuropathy, unspecified: Secondary | ICD-10-CM | POA: Diagnosis not present

## 2016-05-02 DIAGNOSIS — Z79899 Other long term (current) drug therapy: Secondary | ICD-10-CM | POA: Diagnosis not present

## 2016-05-02 DIAGNOSIS — M7072 Other bursitis of hip, left hip: Secondary | ICD-10-CM | POA: Diagnosis not present

## 2016-05-03 DIAGNOSIS — I4891 Unspecified atrial fibrillation: Secondary | ICD-10-CM | POA: Diagnosis not present

## 2016-05-03 DIAGNOSIS — M6281 Muscle weakness (generalized): Secondary | ICD-10-CM | POA: Diagnosis not present

## 2016-05-03 DIAGNOSIS — I2510 Atherosclerotic heart disease of native coronary artery without angina pectoris: Secondary | ICD-10-CM | POA: Diagnosis not present

## 2016-05-03 DIAGNOSIS — E119 Type 2 diabetes mellitus without complications: Secondary | ICD-10-CM | POA: Diagnosis not present

## 2016-05-03 DIAGNOSIS — G8929 Other chronic pain: Secondary | ICD-10-CM | POA: Diagnosis not present

## 2016-05-03 DIAGNOSIS — I130 Hypertensive heart and chronic kidney disease with heart failure and stage 1 through stage 4 chronic kidney disease, or unspecified chronic kidney disease: Secondary | ICD-10-CM | POA: Diagnosis not present

## 2016-05-04 DIAGNOSIS — G8929 Other chronic pain: Secondary | ICD-10-CM | POA: Diagnosis not present

## 2016-05-04 DIAGNOSIS — I2510 Atherosclerotic heart disease of native coronary artery without angina pectoris: Secondary | ICD-10-CM | POA: Diagnosis not present

## 2016-05-04 DIAGNOSIS — M6281 Muscle weakness (generalized): Secondary | ICD-10-CM | POA: Diagnosis not present

## 2016-05-04 DIAGNOSIS — I4891 Unspecified atrial fibrillation: Secondary | ICD-10-CM | POA: Diagnosis not present

## 2016-05-04 DIAGNOSIS — E119 Type 2 diabetes mellitus without complications: Secondary | ICD-10-CM | POA: Diagnosis not present

## 2016-05-04 DIAGNOSIS — I130 Hypertensive heart and chronic kidney disease with heart failure and stage 1 through stage 4 chronic kidney disease, or unspecified chronic kidney disease: Secondary | ICD-10-CM | POA: Diagnosis not present

## 2016-05-08 DIAGNOSIS — G8929 Other chronic pain: Secondary | ICD-10-CM | POA: Diagnosis not present

## 2016-05-08 DIAGNOSIS — E119 Type 2 diabetes mellitus without complications: Secondary | ICD-10-CM | POA: Diagnosis not present

## 2016-05-08 DIAGNOSIS — I4891 Unspecified atrial fibrillation: Secondary | ICD-10-CM | POA: Diagnosis not present

## 2016-05-08 DIAGNOSIS — M6281 Muscle weakness (generalized): Secondary | ICD-10-CM | POA: Diagnosis not present

## 2016-05-08 DIAGNOSIS — I2510 Atherosclerotic heart disease of native coronary artery without angina pectoris: Secondary | ICD-10-CM | POA: Diagnosis not present

## 2016-05-08 DIAGNOSIS — I130 Hypertensive heart and chronic kidney disease with heart failure and stage 1 through stage 4 chronic kidney disease, or unspecified chronic kidney disease: Secondary | ICD-10-CM | POA: Diagnosis not present

## 2016-05-09 DIAGNOSIS — G8929 Other chronic pain: Secondary | ICD-10-CM | POA: Diagnosis not present

## 2016-05-09 DIAGNOSIS — I2510 Atherosclerotic heart disease of native coronary artery without angina pectoris: Secondary | ICD-10-CM | POA: Diagnosis not present

## 2016-05-09 DIAGNOSIS — I130 Hypertensive heart and chronic kidney disease with heart failure and stage 1 through stage 4 chronic kidney disease, or unspecified chronic kidney disease: Secondary | ICD-10-CM | POA: Diagnosis not present

## 2016-05-09 DIAGNOSIS — M6281 Muscle weakness (generalized): Secondary | ICD-10-CM | POA: Diagnosis not present

## 2016-05-09 DIAGNOSIS — I4891 Unspecified atrial fibrillation: Secondary | ICD-10-CM | POA: Diagnosis not present

## 2016-05-09 DIAGNOSIS — E119 Type 2 diabetes mellitus without complications: Secondary | ICD-10-CM | POA: Diagnosis not present

## 2016-05-10 DIAGNOSIS — G8929 Other chronic pain: Secondary | ICD-10-CM | POA: Diagnosis not present

## 2016-05-10 DIAGNOSIS — M6281 Muscle weakness (generalized): Secondary | ICD-10-CM | POA: Diagnosis not present

## 2016-05-10 DIAGNOSIS — I130 Hypertensive heart and chronic kidney disease with heart failure and stage 1 through stage 4 chronic kidney disease, or unspecified chronic kidney disease: Secondary | ICD-10-CM | POA: Diagnosis not present

## 2016-05-10 DIAGNOSIS — I4891 Unspecified atrial fibrillation: Secondary | ICD-10-CM | POA: Diagnosis not present

## 2016-05-10 DIAGNOSIS — E119 Type 2 diabetes mellitus without complications: Secondary | ICD-10-CM | POA: Diagnosis not present

## 2016-05-10 DIAGNOSIS — I2510 Atherosclerotic heart disease of native coronary artery without angina pectoris: Secondary | ICD-10-CM | POA: Diagnosis not present

## 2016-05-15 DIAGNOSIS — G8929 Other chronic pain: Secondary | ICD-10-CM | POA: Diagnosis not present

## 2016-05-15 DIAGNOSIS — I2510 Atherosclerotic heart disease of native coronary artery without angina pectoris: Secondary | ICD-10-CM | POA: Diagnosis not present

## 2016-05-15 DIAGNOSIS — E119 Type 2 diabetes mellitus without complications: Secondary | ICD-10-CM | POA: Diagnosis not present

## 2016-05-15 DIAGNOSIS — I4891 Unspecified atrial fibrillation: Secondary | ICD-10-CM | POA: Diagnosis not present

## 2016-05-15 DIAGNOSIS — I130 Hypertensive heart and chronic kidney disease with heart failure and stage 1 through stage 4 chronic kidney disease, or unspecified chronic kidney disease: Secondary | ICD-10-CM | POA: Diagnosis not present

## 2016-05-15 DIAGNOSIS — M6281 Muscle weakness (generalized): Secondary | ICD-10-CM | POA: Diagnosis not present

## 2016-05-16 DIAGNOSIS — E119 Type 2 diabetes mellitus without complications: Secondary | ICD-10-CM | POA: Diagnosis not present

## 2016-05-16 DIAGNOSIS — I4891 Unspecified atrial fibrillation: Secondary | ICD-10-CM | POA: Diagnosis not present

## 2016-05-16 DIAGNOSIS — I2510 Atherosclerotic heart disease of native coronary artery without angina pectoris: Secondary | ICD-10-CM | POA: Diagnosis not present

## 2016-05-16 DIAGNOSIS — M6281 Muscle weakness (generalized): Secondary | ICD-10-CM | POA: Diagnosis not present

## 2016-05-16 DIAGNOSIS — G8929 Other chronic pain: Secondary | ICD-10-CM | POA: Diagnosis not present

## 2016-05-16 DIAGNOSIS — I130 Hypertensive heart and chronic kidney disease with heart failure and stage 1 through stage 4 chronic kidney disease, or unspecified chronic kidney disease: Secondary | ICD-10-CM | POA: Diagnosis not present

## 2016-05-17 DIAGNOSIS — I130 Hypertensive heart and chronic kidney disease with heart failure and stage 1 through stage 4 chronic kidney disease, or unspecified chronic kidney disease: Secondary | ICD-10-CM | POA: Diagnosis not present

## 2016-05-17 DIAGNOSIS — I4891 Unspecified atrial fibrillation: Secondary | ICD-10-CM | POA: Diagnosis not present

## 2016-05-17 DIAGNOSIS — E119 Type 2 diabetes mellitus without complications: Secondary | ICD-10-CM | POA: Diagnosis not present

## 2016-05-17 DIAGNOSIS — M6281 Muscle weakness (generalized): Secondary | ICD-10-CM | POA: Diagnosis not present

## 2016-05-17 DIAGNOSIS — G8929 Other chronic pain: Secondary | ICD-10-CM | POA: Diagnosis not present

## 2016-05-17 DIAGNOSIS — I2510 Atherosclerotic heart disease of native coronary artery without angina pectoris: Secondary | ICD-10-CM | POA: Diagnosis not present

## 2016-05-18 NOTE — Progress Notes (Signed)
Cardiology Office Note   Date:  05/21/2016   ID:  Julian Ross, DOB April 03, 1946, MRN [id number]  PCP:  Moshe Cipro, MD  Cardiologist:   Skeet Latch, MD   Chief Complaint  Patient presents with  . Shortness of Breath  . Hypotension    x 2 weeks  . Near Syncope    1 week ago     History of Present Illness: Julian Ross is a 70 y.o. 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  During that hospitalization he also had elevated troponin consistent with demand ischemia (troponin 0.13) and new onset atrial fibrillation with RVR.  He was diuresed to a discharge weight of 196 lb (from 208 lb on admit).   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  He previously had 16 stents placed in [place], [place].  His cardiologist there is [name].  The inpatient team contacted [name], who informed them that [name] has a tight RCA lesion but no recent interventions.  He felt that [name] could transition from Plavix to aspirin.  He has been intolerant to statins in the past.  He was discharged to [facility] center and followed up with Ermalinda Barrios, PA, on 4/17.  At that appointment he noted hypotension and low blood glucose.  At that appointment bidil was partitioned into hydralazine and Imdur for easier titration.  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  His main complaint is fatigue.  His wife notes that he has been sleeping a lot.  He has occasional episodes of chest tightness that occur at rest rather than with exertion.  They last for a few seconds and are not associated with shortness of breath, nausea or diaphoresis.  His blood pressure and blood glucose have both been low lately.  He had an episode of  syncope last week that occurred after getting up from a seating position.  There was no preceding chest pain, shortness of breath or palpitations.  His BP was 72/52 and glucose was 145.  He has been participating in PT/OT without lightheadedness or dizziness.  They check his BP regularly and it has been "low normal' though he does not know the exact values.  He also likes to walk at [facility]   He also reports a productive cough but denies fever or chills.  Past Medical History  Diagnosis Date  . MI (myocardial infarction) ([place])   . Diabetes mellitus type 2 in obese ([place])   . Hypertension   . Septic shock ([place])   . CKD (chronic kidney disease), stage III   . Edema of leg   . Elevated troponin   . Anemia   . Acute hypoxemic respiratory failure ([place])   . Altered mental status   . Atrial fibrillation, new onset ([place])   . AKI (acute kidney injury) ([place])   . Acute respiratory failure with hypoxia ([place])   . Chronic systolic CHF (congestive heart failure) ([place])   . Cardiomyopathy, ischemic   . Demand ischemia ([place])   . Acute renal failure superimposed on stage 3 chronic kidney disease ([place])   . Hypokalemia   . Rheumatoid arthritis of multiple sites with negative rheumatoid factor ([place])   . Acute encephalopathy   . Anxiety state     Past  Surgical History  Procedure Laterality Date  . Coronary angioplasty with stent placement      x 10  . Angioplasty       Current Outpatient Prescriptions  Medication Sig Dispense Refill  . acetaminophen (TYLENOL) 325 MG tablet Take 650 mg by mouth every 4 (four) hours as needed for mild pain or headache.    Marland Kitchen apixaban (ELIQUIS) 5 MG TABS tablet Take 1 tablet (5 mg total) by mouth 2 (two) times daily. 60 tablet   . folic acid (FOLVITE) 1 MG tablet Take 1 mg by mouth daily.    . furosemide (LASIX) 40 MG tablet Take 1 tablet (40 mg total) by mouth 2 (two) times daily. 30 tablet   . gabapentin (NEURONTIN) 100 MG capsule Take 1 capsule (100 mg total) by  mouth daily. 30 capsule 0  . hydrocortisone (ANUSOL-HC) 2.5 % rectal cream Place 1 application rectally 2 (two) times daily as needed for hemorrhoids or itching.    . insulin glargine (LANTUS) 100 UNIT/ML injection Inject 0.05 mLs (5 Units total) into the skin daily. 10 mL 11  . insulin lispro protamine-lispro (HUMALOG 50/50 MIX) (50-50) 100 UNIT/ML SUSP injection Inject 40-50 Units into the skin 3 (three) times daily with meals. Inject 50 units subcutaneously with breakfast and 40 units with lunch & supper    . leucovorin (WELLCOVORIN) 10 MG tablet Take 10 mg by mouth See admin instructions. Take 1 tablet (10 mg) by mouth every 12 hours and 24 hours after the methotrexate dose    . lidocaine (LIDODERM) 5 % Place 0.5 patches onto the skin daily. Remove & Discard patch within 12 hours or as directed by MD.  Apply to lower back and left shoulder QHS    . losartan (COZAAR) 50 MG tablet Take 50 mg by mouth daily.    Marland Kitchen LUMIGAN 0.01 % SOLN Place 1 drop into both eyes daily.  0  . magnesium oxide (MAG-OX) 400 (241.3 Mg) MG tablet Take 1 tablet (400 mg total) by mouth 2 (two) times daily. 60 tablet 0  . Methotrexate Sodium (METHOTREXATE, PF,) 50 MG/2ML injection Inject 15 mg into the muscle once a week. Inject 0.6 ml (15 mg) - on Fridays.  Must be given the same time every administration  0  . metoprolol 75 MG TABS Take 75 mg by mouth 2 (two) times daily.    . Misc Natural Products (OSTEO BI-FLEX ADV DOUBLE ST PO) Take 1 tablet by mouth 2 (two) times daily.    Marland Kitchen PARoxetine (PAXIL) 40 MG tablet Take 1 tablet by mouth daily.  11  . potassium chloride SA (K-DUR,KLOR-CON) 20 MEQ tablet Take 2 tablets (40 mEq total) by mouth 3 (three) times daily.    . predniSONE (DELTASONE) 5 MG tablet Take 5 mg by mouth daily with breakfast.    . Protein (PROCEL 100) POWD Take 2 scoop by mouth 2 (two) times daily.    . tamsulosin (FLOMAX) 0.4 MG CAPS capsule Take 0.4 mg by mouth daily.   4  . topiramate (TOPAMAX) 25 MG tablet  Take 25 mg by mouth 2 (two) times daily.    Marland Kitchen azithromycin (ZITHROMAX) 250 MG tablet 2 tablets by mouth on day 1 and then 1 daily until finished 6 each 0   Current Facility-Administered Medications  Medication Dose Route Frequency Provider Last Rate Last Dose  . 0.9 %  sodium chloride infusion  250 mL Intravenous Continuous Skeet Latch, MD      . sodium chloride flush (  NS) 0.9 % injection 3 mL  3 mL Intravenous Q12H Skeet Latch, MD      . sodium chloride flush (NS) 0.9 % injection 3 mL  3 mL Intravenous PRN Skeet Latch, MD        Allergies:   Ace inhibitors; Atenolol; Contrast media; and Penicillins    Social History:  The patient  reports that he has never smoked. He does not have any smokeless tobacco history on file. He reports that he does not drink alcohol or use illicit drugs.   Family History:  The patient's family history includes Heart attack in his brother and father; Heart failure in his mother; Hyperlipidemia in his mother; Hypertension in his mother.    ROS:  Please see the history of present illness.   Otherwise, review of systems are positive for none.   All other systems are reviewed and negative.    PHYSICAL EXAM: VS:  BP 92/58 mmHg  Pulse 68  Ht 5\' 11"  (1.803 m)  Wt 93.985 kg (207 lb 3.2 oz)  BMI 28.91 kg/m2 , BMI Body mass index is 28.91 kg/(m^2). GENERAL:  Well appearing HEENT:  Pupils equal round and reactive, fundi not visualized, oral mucosa unremarkable NECK:  No jugular venous distention, waveform within normal limits, carotid upstroke brisk and symmetric, no bruits, no thyromegaly LYMPHATICS:  No cervical adenopathy LUNGS:  Rhonchi at right base. HEART:  RRR.  PMI not displaced or sustained,S1 and S2 within normal limits, no S3, no S4, no clicks, no rubs, no murmurs ABD:  Flat, positive bowel sounds normal in frequency in pitch, no bruits, no rebound, no guarding, no midline pulsatile mass, no hepatomegaly, no splenomegaly EXT:  2 plus pulses  throughout, no edema, no cyanosis no clubbing SKIN:  No rashes no nodules NEURO:  Cranial nerves II through XII grossly intact, motor grossly intact throughout PSYCH:  Cognitively intact, oriented to person place and time   EKG:  EKG is ordered today. The ekg ordered today demonstrates atrial fibrillation rate 68 bpm.     Recent Labs: 03/18/2016: TSH 0.501 03/19/2016: B Natriuretic Peptide 385.9* 04/01/2016: ALT 42 04/02/2016: Magnesium 2.3 04/10/2016: BUN 24*; Creatinine 1.2; Hemoglobin 11.5*; Platelets 337; Potassium 4.4; Sodium 144    Lipid Panel    Component Value Date/Time   CHOL 123 03/22/2016 0320   TRIG 154* 03/22/2016 0320   HDL 25* 03/22/2016 0320   CHOLHDL 4.9 03/22/2016 0320   VLDL 31 03/22/2016 0320   LDLCALC 67 03/22/2016 0320      Wt Readings from Last 3 Encounters:  05/21/16 93.985 kg (207 lb 3.2 oz)  04/19/16 91.627 kg (202 lb)  04/16/16 97.07 kg (214 lb)      ASSESSMENT AND PLAN:  # Chronic systolic and diastolic heart failure:  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 His LVEF was reduced on the recent hospitalization, which may have been due to sepsis.  We will repeat the echo at the end of June.  If the LVEF is not >35%, he will need to be considered for ICD.  Continue losartan, lasix and metoprolol. If his BP will tolerate, consider adding spironolactone at future appointment.   # Hypertension/hypotension: BP has been quite low and he had an episode of syncope.  We will stop imdur and hydralazine.   Continue metoprolol and losartan.    # Cough: Concerning for pneumonia given the rhonchi at the right base. We will obtain a CXR and start azithromycin 500mg  x1 followed  by 250 mg daily x 4 days.    # Atrial fibrillation: He is in atrial fibrillation today but rate is well-controlled.  Continue Eliquis.  Given his fatigue and the fact that atrial fibrillation is a new diagnosis, we will plan for DCCV in 2 days.  He has been on  anticoagulation >1 week.   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  He does not have any symptoms of angina at this time.  Consider stress testing if he develops symptoms.  We will refer him for cardiac rehab.    Current medicines are reviewed at length with the patient today.  The patient does not have concerns regarding medicines.  The following changes have been made:  no change  Labs/ tests ordered today include: stop hydralazine and Imdur   Orders Placed This Encounter  Procedures  . DG Chest 2 View  . CBC with Differential/Platelet  . Basic metabolic panel  . PTT  . INR/PT  . Diet NPO time specified  . Verify informed consent  . If patient is a diabetic, place order for and obtain CBG  . If patient is taking diuretic or labs are older than 2 days, place order for and obtain BMET  . If patient is on rivaroxaban [facility]), verify that patient currently has taken 3 consecutive doses  . If patient is on dibigatran [facility]) or apixaban [facility]), verify that patient currently has taken 5 consecutive doses  . Document by rhythm strip atrial fibrillation or atrial flutter.  If patient is in NSR, call MD  . Start new IV if blood or medications are infusing  . Shave left side of chest and back if necessary for pad placement  . Remove and safely store all jewelry.  Tape rings in place that cannot be removed  . Provide equipment / supplies at bedside  . Patient to wear single hospital gown  . Cardiac monitoring  . Vital signs post cardioversion  . Document Pasero Opioid-Induced Sedation Scale (POSS)  per protocol (see sidebar report)  . Vital signs  . Obtain Consent  . EKG 12-Lead     Disposition:   FU with Landrey Mahurin C. [name], MD, [facility] in 2 months   This note was written with the assistance of speech recognition software.  Please excuse any transcriptional errors.  Signed, Madelein Mahadeo C. [name], MD, [facility]  05/21/2016 1:22 PM    [facility]

## 2016-05-19 ENCOUNTER — Other Ambulatory Visit: Payer: Self-pay | Admitting: Adult Health

## 2016-05-20 ENCOUNTER — Other Ambulatory Visit: Payer: Self-pay | Admitting: Adult Health

## 2016-05-21 ENCOUNTER — Ambulatory Visit
Admission: RE | Admit: 2016-05-21 | Discharge: 2016-05-21 | Disposition: A | Discharge status: Home / Self Care | Payer: Medicare Other | Source: Ambulatory Visit | Attending: Cardiovascular Disease | Admitting: Cardiovascular Disease

## 2016-05-21 ENCOUNTER — Ambulatory Visit (INDEPENDENT_AMBULATORY_CARE_PROVIDER_SITE_OTHER): Payer: Medicare Other | Admitting: Cardiovascular Disease

## 2016-05-21 ENCOUNTER — Encounter: Payer: Self-pay | Admitting: Cardiovascular Disease

## 2016-05-21 VITALS — BP 92/58 | HR 68 | Ht 71.0 in | Wt 207.2 lb

## 2016-05-21 DIAGNOSIS — I130 Hypertensive heart and chronic kidney disease with heart failure and stage 1 through stage 4 chronic kidney disease, or unspecified chronic kidney disease: Secondary | ICD-10-CM | POA: Diagnosis not present

## 2016-05-21 DIAGNOSIS — I5042 Chronic combined systolic (congestive) and diastolic (congestive) heart failure: Secondary | ICD-10-CM

## 2016-05-21 DIAGNOSIS — I951 Orthostatic hypotension: Secondary | ICD-10-CM | POA: Diagnosis not present

## 2016-05-21 DIAGNOSIS — I4891 Unspecified atrial fibrillation: Secondary | ICD-10-CM

## 2016-05-21 DIAGNOSIS — G8929 Other chronic pain: Secondary | ICD-10-CM | POA: Diagnosis not present

## 2016-05-21 DIAGNOSIS — I255 Ischemic cardiomyopathy: Secondary | ICD-10-CM | POA: Diagnosis not present

## 2016-05-21 DIAGNOSIS — R55 Syncope and collapse: Secondary | ICD-10-CM

## 2016-05-21 DIAGNOSIS — J40 Bronchitis, not specified as acute or chronic: Secondary | ICD-10-CM

## 2016-05-21 DIAGNOSIS — I110 Hypertensive heart disease with heart failure: Secondary | ICD-10-CM | POA: Diagnosis not present

## 2016-05-21 DIAGNOSIS — E119 Type 2 diabetes mellitus without complications: Secondary | ICD-10-CM | POA: Diagnosis not present

## 2016-05-21 DIAGNOSIS — I214 Non-ST elevation (NSTEMI) myocardial infarction: Secondary | ICD-10-CM | POA: Diagnosis not present

## 2016-05-21 DIAGNOSIS — M6281 Muscle weakness (generalized): Secondary | ICD-10-CM | POA: Diagnosis not present

## 2016-05-21 DIAGNOSIS — I2510 Atherosclerotic heart disease of native coronary artery without angina pectoris: Secondary | ICD-10-CM | POA: Diagnosis not present

## 2016-05-21 MED ORDER — SODIUM CHLORIDE 0.9 % IV SOLN: 250.0000 mL | INTRAVENOUS | Status: DC

## 2016-05-21 MED ORDER — AZITHROMYCIN 250 MG PO TABS: ORAL_TABLET | ORAL | Status: DC

## 2016-05-21 MED ORDER — SODIUM CHLORIDE 0.9% FLUSH: 3.0000 mL | INTRAVENOUS | Status: DC | PRN

## 2016-05-21 MED ORDER — SODIUM CHLORIDE 0.9% FLUSH: 3.0000 mL | Freq: Two times a day (BID) | INTRAVENOUS | Status: DC

## 2016-05-21 NOTE — Patient Instructions (Addendum)
Medication Instructions:  STOP YOUR ISOSORBIDE (IMDUR)  STOP HYDRALAZINE   Z PAK HAS BEEN SENT TO THE PHARMACY. 2 TABLETS TODAY AND THEN 1 DAILY UNTIL FINISHED   Labwork: BMET/CBC/PTT/PT/INR at [facility] lab on the first floor   Testing/Procedures: A chest x-ray takes a picture of the organs and structures inside the chest, including the heart, lungs, and blood vessels. This test can show several things, including, whether the heart is enlarges; whether fluid is building up in the lungs; and whether pacemaker / defibrillator leads are still in place. [facility]: Your physician recommends that you schedule a follow-up appointment in: 2 MONTH OV  Any Other Special Instructions Will Be Listed Below (If Applicable)  You are scheduled for a CARDIOVERSION  05/23/16 with Dr. [name] or associate.  Go to [place] on 05/23/16 at 10:30 am.  Enter thru the [facility] entrance A No food or drink after midnight night before. You may take your medications with a sip of water on the day of your procedure.  Hold am dose of inusulin morning of  You will need someone to be with you and drive you ho   Electrical Cardioversion Electrical cardioversion is the delivery of a jolt of electricity to change the rhythm of the heart. Sticky patches or metal paddles are placed on the chest to deliver the electricity from a device. This is done to restore a normal rhythm. A rhythm that is too fast or not regular keeps the heart from pumping well. Electrical cardioversion is done in an emergency if:   There is low or no blood pressure as a result of the heart rhythm.   Normal rhythm must be restored as fast as possible to protect the brain and heart from further damage.   It may save a life. Cardioversion may be done for heart rhythms that are not immediately life threatening, such as atrial fibrillation or flutter, in which:   The heart is beating too fast  or is not regular.   Medicine to change the rhythm has not worked.   It is safe to wait in order to allow time for preparation.  Symptoms of the abnormal rhythm are bothersome.  The risk of stroke and other serious problems can be reduced. LET [facility] CARE PROVIDER KNOW ABOUT:   Any allergies you have.  All medicines you are taking, including vitamins, herbs, eye drops, creams, and over-the-counter medicines.  Previous problems you or members of your family have had with the use of anesthetics.   Any blood disorders you have.   Previous surgeries you have had.   Medical conditions you have. RISKS AND COMPLICATIONS  Generally, this is a safe procedure. However, problems can occur and include:   Breathing problems related to the anesthetic used.  A blood clot that breaks free and travels to other parts of your body. This could cause a stroke or other problems. The risk of this is lowered by use of blood-thinning medicine (anticoagulant) prior to the procedure.  Cardiac arrest (rare). BEFORE THE PROCEDURE   You may have tests to detect blood clots in your heart and to evaluate heart function.  You may start taking anticoagulants so your blood does not clot as easily.   Medicines may be given to help stabilize your heart rate and rhythm. PROCEDURE  You will be given medicine through an IV tube to reduce discomfort and make you sleepy (sedative).   An electrical shock  will be delivered. AFTER THE PROCEDURE Your heart rhythm will be watched to make sure it does not change.    This information is not intended to replace advice given to you by your health care provider. Make sure you discuss any questions you have with your health care provider.   Document Released: 12/07/2002 Document Revised: 01/07/2015 Document Reviewed: 07/01/2013 Elsevier Interactive Patient Education [employer].

## 2016-05-22 ENCOUNTER — Telehealth: Payer: Self-pay | Admitting: *Deleted

## 2016-05-22 DIAGNOSIS — I2510 Atherosclerotic heart disease of native coronary artery without angina pectoris: Secondary | ICD-10-CM | POA: Diagnosis not present

## 2016-05-22 DIAGNOSIS — M6281 Muscle weakness (generalized): Secondary | ICD-10-CM | POA: Diagnosis not present

## 2016-05-22 DIAGNOSIS — N289 Disorder of kidney and ureter, unspecified: Secondary | ICD-10-CM

## 2016-05-22 DIAGNOSIS — G8929 Other chronic pain: Secondary | ICD-10-CM | POA: Diagnosis not present

## 2016-05-22 DIAGNOSIS — E875 Hyperkalemia: Secondary | ICD-10-CM | POA: Diagnosis not present

## 2016-05-22 DIAGNOSIS — I4891 Unspecified atrial fibrillation: Secondary | ICD-10-CM | POA: Diagnosis not present

## 2016-05-22 DIAGNOSIS — I130 Hypertensive heart and chronic kidney disease with heart failure and stage 1 through stage 4 chronic kidney disease, or unspecified chronic kidney disease: Secondary | ICD-10-CM | POA: Diagnosis not present

## 2016-05-22 DIAGNOSIS — H33321 Round hole, right eye: Secondary | ICD-10-CM | POA: Diagnosis not present

## 2016-05-22 DIAGNOSIS — E119 Type 2 diabetes mellitus without complications: Secondary | ICD-10-CM | POA: Diagnosis not present

## 2016-05-22 LAB — BASIC METABOLIC PANEL
BUN: 31 mg/dL — AB (ref 7–25)
CALCIUM: 9.9 mg/dL (ref 8.6–10.3)
CO2: 27 mmol/L (ref 20–31)
CREATININE: 2.26 mg/dL — AB (ref 0.70–1.25)
Chloride: 99 mmol/L (ref 98–110)
GLUCOSE: 197 mg/dL — AB (ref 65–99)
Potassium: 6 mmol/L — ABNORMAL HIGH (ref 3.5–5.3)
Sodium: 136 mmol/L (ref 135–146)

## 2016-05-22 LAB — CBC WITH DIFFERENTIAL/PLATELET
BASOS ABS: 105 {cells}/uL (ref 0–200)
Basophils Relative: 1 %
EOS ABS: 210 {cells}/uL (ref 15–500)
EOS PCT: 2 %
HEMATOCRIT: 40.1 % (ref 38.5–50.0)
HEMOGLOBIN: 12.8 g/dL — AB (ref 13.2–17.1)
Lymphocytes Relative: 11 %
Lymphs Abs: 1155 cells/uL (ref 850–3900)
MCH: 30.8 pg (ref 27.0–33.0)
MCHC: 31.9 g/dL — AB (ref 32.0–36.0)
MCV: 96.4 fL (ref 80.0–100.0)
MONO ABS: 1050 {cells}/uL — AB (ref 200–950)
MPV: 9.5 fL (ref 7.5–12.5)
Monocytes Relative: 10 %
NEUTROS ABS: 7980 {cells}/uL — AB (ref 1500–7800)
NEUTROS PCT: 76 %
Platelets: 335 10*3/uL (ref 140–400)
RBC: 4.16 MIL/uL — ABNORMAL LOW (ref 4.20–5.80)
RDW: 15.6 % — ABNORMAL HIGH (ref 11.0–15.0)
WBC: 10.5 10*3/uL (ref 3.8–10.8)

## 2016-05-22 LAB — APTT: APTT: 35 s (ref 24–37)

## 2016-05-22 LAB — PROTIME-INR
INR: 1.43 (ref <1.50)
Prothrombin Time: 17.6 seconds — ABNORMAL HIGH (ref 11.6–15.2)

## 2016-05-22 NOTE — Telephone Encounter (Signed)
Advised patient of lab results Patient will stop K+ as well per Dr Conard Novak DCCV for tomorrow

## 2016-05-22 NOTE — Telephone Encounter (Signed)
-----   Message from Skeet Latch, MD sent at 05/22/2016 12:26 PM EDT ----- Potassium level is elevated and kidney function is worse.  I think he is over-diuresed.  Please ask him to hold lasix and losartan today and tomorrow.  Return for BMP on 5/25 and see me 5/26.  Cancel DCCV until potassium is within normal limits

## 2016-05-23 ENCOUNTER — Ambulatory Visit (HOSPITAL_COMMUNITY): Admission: RE | Admit: 2016-05-23 | Payer: Medicare Other | Source: Ambulatory Visit | Admitting: Cardiovascular Disease

## 2016-05-23 ENCOUNTER — Encounter (HOSPITAL_COMMUNITY): Admission: RE | Payer: Self-pay | Source: Ambulatory Visit

## 2016-05-23 SURGERY — CARDIOVERSION
Anesthesia: Monitor Anesthesia Care

## 2016-05-24 DIAGNOSIS — I2510 Atherosclerotic heart disease of native coronary artery without angina pectoris: Secondary | ICD-10-CM | POA: Diagnosis not present

## 2016-05-24 DIAGNOSIS — I4891 Unspecified atrial fibrillation: Secondary | ICD-10-CM | POA: Diagnosis not present

## 2016-05-24 DIAGNOSIS — G8929 Other chronic pain: Secondary | ICD-10-CM | POA: Diagnosis not present

## 2016-05-24 DIAGNOSIS — I130 Hypertensive heart and chronic kidney disease with heart failure and stage 1 through stage 4 chronic kidney disease, or unspecified chronic kidney disease: Secondary | ICD-10-CM | POA: Diagnosis not present

## 2016-05-24 DIAGNOSIS — E119 Type 2 diabetes mellitus without complications: Secondary | ICD-10-CM | POA: Diagnosis not present

## 2016-05-24 DIAGNOSIS — M6281 Muscle weakness (generalized): Secondary | ICD-10-CM | POA: Diagnosis not present

## 2016-05-24 LAB — BASIC METABOLIC PANEL
BUN: 31 mg/dL — ABNORMAL HIGH (ref 7–25)
CALCIUM: 9.3 mg/dL (ref 8.6–10.3)
CO2: 25 mmol/L (ref 20–31)
Chloride: 98 mmol/L (ref 98–110)
Creat: 1.86 mg/dL — ABNORMAL HIGH (ref 0.70–1.25)
Glucose, Bld: 177 mg/dL — ABNORMAL HIGH (ref 65–99)
Potassium: 4.2 mmol/L (ref 3.5–5.3)
SODIUM: 136 mmol/L (ref 135–146)

## 2016-05-24 NOTE — Progress Notes (Signed)
Cardiology Office Note   Date:  05/25/2016   ID:  Julian Ross, DOB 1946/05/06, MRN [id number]  PCP:  Julian Cipro, MD  Cardiologist:   Skeet Latch, MD   Chief Complaint  Patient presents with  . Follow-up    sob; this morning, havent experienced it any other time. edema; randomly in legs.     History of Present Illness: Julian Ross is a 70 y.o. 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  During that hospitalization he also had elevated troponin consistent with demand ischemia (troponin 0.13) and new onset atrial fibrillation with RVR.  He was diuresed to a discharge weight of 196 lb (from 208 lb on admit).  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  He previously had 16 stents placed in [place], [place].  His cardiologist there is [name].  The inpatient team contacted [name], who informed them that [name] has a tight RCA lesion but no recent interventions.  He felt that [name] could transition from Plavix to aspirin.  He has been intolerant to statins in the past.   At his last appointment [name] noted fatigue and an episode of syncope.  He BP was low in the office,so hydralazine and isosorbide were discontinued. Basic metabolic panel was obtained which revealed a potassium of 6 and creatinine was 2.26 up from 1.2 in the hospital. He was instructed to stop lisinopril and losartan. He has continued to take losartan and reduce the metoprolol dose in half. He did stop taking Lasix, although he took one 20 mg dose yesterday he developed lower extremity edema. Overall he has felt fairly well. However he has noted persistent fatigue. His breathing has been okay. He denies orthopnea or PND.  He does not weigh  himself regularly.  Past Medical History  Diagnosis Date  . MI (myocardial infarction) ([place])   . Diabetes mellitus type 2 in obese ([place])   . Hypertension   . Septic shock ([place])   . CKD (chronic kidney disease), stage III   . Edema of leg   . Elevated troponin   . Anemia   . Acute hypoxemic respiratory failure ([place])   . Altered mental status   . Atrial fibrillation, new onset ([place])   . AKI (acute kidney injury) ([place])   . Acute respiratory failure with hypoxia ([place])   . Chronic systolic CHF (congestive heart failure) ([place])   . Cardiomyopathy, ischemic   . Demand ischemia ([place])   . Acute renal failure superimposed on stage 3 chronic kidney disease ([place])   . Hypokalemia   . Rheumatoid arthritis of multiple sites with negative rheumatoid factor ([place])   . Acute encephalopathy   . Anxiety state     Past Surgical History  Procedure Laterality Date  . Coronary angioplasty with stent placement      x 10  . Angioplasty       Current Outpatient Prescriptions  Medication Sig Dispense Refill  . furosemide (LASIX) 40 MG tablet Take 40 mg by mouth as directed. 1 tablet by mouth as needed for weight of 2 pounds overnight or 5 in 1 week    . acetaminophen (TYLENOL) 325 MG tablet Take 650 mg by mouth every 4 (four) hours as needed for mild pain or headache.    Marland Kitchen  apixaban (ELIQUIS) 5 MG TABS tablet Take 1 tablet (5 mg total) by mouth 2 (two) times daily. 60 tablet   . azithromycin (ZITHROMAX) 250 MG tablet 2 tablets by mouth on day 1 and then 1 daily until finished 6 each 0  . folic acid (FOLVITE) 1 MG tablet Take 1 mg by mouth daily.    Marland Kitchen gabapentin (NEURONTIN) 100 MG capsule Take 1 capsule (100 mg total) by mouth daily. 30 capsule 0  . hydrocortisone (ANUSOL-HC) 2.5 % rectal cream Place 1 application rectally 2 (two) times daily as needed for hemorrhoids or itching.    . insulin glargine (LANTUS) 100 UNIT/ML injection Inject 0.05 mLs (5 Units total) into the skin daily. 10 mL 11  . insulin  lispro protamine-lispro (HUMALOG 50/50 MIX) (50-50) 100 UNIT/ML SUSP injection Inject 40-50 Units into the skin 3 (three) times daily with meals. Inject 50 units subcutaneously with breakfast and 40 units with lunch & supper    . leucovorin (WELLCOVORIN) 10 MG tablet Take 10 mg by mouth See admin instructions. Take 1 tablet (10 mg) by mouth every 12 hours and 24 hours after the methotrexate dose    . lidocaine (LIDODERM) 5 % Place 0.5 patches onto the skin daily. Remove & Discard patch within 12 hours or as directed by MD.  Apply to lower back and left shoulder QHS    . LUMIGAN 0.01 % SOLN Place 1 drop into both eyes daily.  0  . magnesium oxide (MAG-OX) 400 (241.3 Mg) MG tablet Take 1 tablet (400 mg total) by mouth 2 (two) times daily. 60 tablet 0  . Methotrexate Sodium (METHOTREXATE, PF,) 50 MG/2ML injection Inject 15 mg into the muscle once a week. Inject 0.6 ml (15 mg) - on Fridays.  Must be given the same time every administration  0  . metoprolol 75 MG TABS Take 75 mg by mouth 2 (two) times daily.    . Misc Natural Products (OSTEO BI-FLEX ADV DOUBLE ST PO) Take 1 tablet by mouth 2 (two) times daily.    Marland Kitchen PARoxetine (PAXIL) 40 MG tablet Take 1 tablet by mouth daily.  11  . predniSONE (DELTASONE) 5 MG tablet Take 5 mg by mouth daily with breakfast.    . Protein (PROCEL 100) POWD Take 2 scoop by mouth 2 (two) times daily.    . tamsulosin (FLOMAX) 0.4 MG CAPS capsule Take 0.4 mg by mouth daily.   4  . topiramate (TOPAMAX) 25 MG tablet Take 25 mg by mouth 2 (two) times daily.     Current Facility-Administered Medications  Medication Dose Route Frequency Provider Last Rate Last Dose  . 0.9 %  sodium chloride infusion  250 mL Intravenous Continuous Skeet Latch, MD      . sodium chloride flush (NS) 0.9 % injection 3 mL  3 mL Intravenous Q12H Skeet Latch, MD      . sodium chloride flush (NS) 0.9 % injection 3 mL  3 mL Intravenous PRN Skeet Latch, MD        Allergies:   Ace  inhibitors; Atenolol; Contrast media; and Penicillins    Social History:  The patient  reports that he has never smoked. He does not have any smokeless tobacco history on file. He reports that he does not drink alcohol or use illicit drugs.   Family History:  The patient's family history includes Heart attack in his brother and father; Heart failure in his mother; Hyperlipidemia in his mother; Hypertension in his mother.  ROS:  Please see the history of present illness.   Otherwise, review of systems are positive for none.   All other systems are reviewed and negative.    PHYSICAL EXAM: VS:  BP 143/92 mmHg  Pulse 56  Ht 5\' 11"  (1.803 m)  Wt 87.726 kg (193 lb 6.4 oz)  BMI 26.99 kg/m2 , BMI Body mass index is 26.99 kg/(m^2). GENERAL:  Well appearing HEENT:  Pupils equal round and reactive, fundi not visualized, oral mucosa unremarkable NECK:  No jugular venous distention, waveform within normal limits, carotid upstroke brisk and symmetric, no bruits LYMPHATICS:  No cervical adenopathy LUNGS:  Rhonchi at right base. HEART:  RRR.  PMI not displaced or sustained,S1 and S2 within normal limits, no S3, no S4, no clicks, no rubs, no murmurs ABD:  Flat, positive bowel sounds normal in frequency in pitch, no bruits, no rebound, no guarding, no midline pulsatile mass, no hepatomegaly, no splenomegaly EXT:  2 plus pulses throughout, no edema, no cyanosis no clubbing SKIN:  No rashes no nodules NEURO:  Cranial nerves II through XII grossly intact, motor grossly intact throughout PSYCH:  Cognitively intact, oriented to person place and time   EKG:  EKG is not ordered today. The ekg ordered 05/22/16 demonstrates atrial fibrillation rate 68 bpm.     Recent Labs: 03/18/2016: TSH 0.501 03/19/2016: B Natriuretic Peptide 385.9* 04/01/2016: ALT 42 04/02/2016: Magnesium 2.3 05/21/2016: Hemoglobin 12.8*; Platelets 335 05/22/2016: BUN 31*; Creat 1.86*; Potassium 4.2; Sodium 136    Lipid Panel      Component Value Date/Time   CHOL 123 03/22/2016 0320   TRIG 154* 03/22/2016 0320   HDL 25* 03/22/2016 0320   CHOLHDL 4.9 03/22/2016 0320   VLDL 31 03/22/2016 0320   LDLCALC 67 03/22/2016 0320      Wt Readings from Last 3 Encounters:  05/25/16 87.726 kg (193 lb 6.4 oz)  05/21/16 93.985 kg (207 lb 3.2 oz)  04/19/16 91.627 kg (202 lb)      ASSESSMENT AND PLAN:  # Chronic systolic and diastolic heart failure:   # Acute renal failure: 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  However he had lower extremity edema yesterday that improved with Lasix. I suspect that his acute renal failure is due to overdiuresis. I have asked him to start weighing himself daily. If his weight goes up by more than 2 pounds in one day or 5 pounds in one week, he will take Lasix 40 mg that day. We will stop losartan due to acute renal failure that has not yet returned to baseline. He will continue metoprolol 75 mg twice daily.  His LVEF was reduced on the recent hospitalization, which may have been due to sepsis.  We will repeat the echo at the end of June.  If the LVEF is not >35%, he will need to be considered for ICD.  We will try to restart an ARB and consider spironolactone once his renal function stabilizes.  # Hypertension/hypotension: BP is no longer low.  He will continue metoprolol and hold losartan as above. Return in 2 weeks for an assessment of his basic metabolic panel and magnesium to determine whether his ARB can be restarted.   # Cough: Improving pneumonitis seen on CXR.  No pulmonary edema.  # Atrial fibrillation: He remains in atrial fibrillation today but rate is well-controlled.  Continue Eliquis and metoprolol   Rescheduled for 06/04/16.  He has been on anticoagulation >1 week.  This patients CHA2DS2-VASc Score and unadjusted Ischemic Stroke Rate (% per year) is equal to 4.8 % stroke rate/year from a score of 4  Above score calculated as 1 point each if present  [CHF, HTN, DM, Vascular=MI/PAD/Aortic Plaque, Age if 65-74, or Male] Above score calculated as 2 points each if present [Age > 75, or Stroke/TIA/TE]  # NSTEMI: [name] had a mildly elevated troponin that seems consistent with demand ischemia.  He does not have any symptoms of angina at this time.  Consider stress testing if he develops symptoms.      Current medicines are reviewed at length with the patient today.  The patient does not have concerns regarding medicines.  The following changes have been made:  no change  Labs/ tests ordered today include: stop hydralazine and Imdur   No orders of the defined types were placed in this encounter.     Disposition:   FU with Jinger Middlesworth C. [name], MD, [facility] in 2 months   This note was written with the assistance of speech recognition software.  Please excuse any transcriptional errors.  Signed, Srishti Strnad C. [name], MD, [facility]  05/25/2016 12:40 PM    [place]

## 2016-05-25 ENCOUNTER — Ambulatory Visit (INDEPENDENT_AMBULATORY_CARE_PROVIDER_SITE_OTHER): Payer: Medicare Other | Admitting: Cardiovascular Disease

## 2016-05-25 ENCOUNTER — Encounter: Payer: Self-pay | Admitting: Cardiovascular Disease

## 2016-05-25 VITALS — BP 143/92 | HR 56 | Ht 71.0 in | Wt 193.4 lb

## 2016-05-25 DIAGNOSIS — I481 Persistent atrial fibrillation: Secondary | ICD-10-CM

## 2016-05-25 DIAGNOSIS — I4819 Other persistent atrial fibrillation: Secondary | ICD-10-CM

## 2016-05-25 DIAGNOSIS — I255 Ischemic cardiomyopathy: Secondary | ICD-10-CM

## 2016-05-25 DIAGNOSIS — I2510 Atherosclerotic heart disease of native coronary artery without angina pectoris: Secondary | ICD-10-CM | POA: Diagnosis not present

## 2016-05-25 DIAGNOSIS — I130 Hypertensive heart and chronic kidney disease with heart failure and stage 1 through stage 4 chronic kidney disease, or unspecified chronic kidney disease: Secondary | ICD-10-CM | POA: Diagnosis not present

## 2016-05-25 DIAGNOSIS — N179 Acute kidney failure, unspecified: Secondary | ICD-10-CM | POA: Diagnosis not present

## 2016-05-25 DIAGNOSIS — I10 Essential (primary) hypertension: Secondary | ICD-10-CM | POA: Diagnosis not present

## 2016-05-25 DIAGNOSIS — E119 Type 2 diabetes mellitus without complications: Secondary | ICD-10-CM | POA: Diagnosis not present

## 2016-05-25 DIAGNOSIS — M6281 Muscle weakness (generalized): Secondary | ICD-10-CM | POA: Diagnosis not present

## 2016-05-25 DIAGNOSIS — I5042 Chronic combined systolic (congestive) and diastolic (congestive) heart failure: Secondary | ICD-10-CM | POA: Diagnosis not present

## 2016-05-25 DIAGNOSIS — I2583 Coronary atherosclerosis due to lipid rich plaque: Secondary | ICD-10-CM

## 2016-05-25 DIAGNOSIS — G8929 Other chronic pain: Secondary | ICD-10-CM | POA: Diagnosis not present

## 2016-05-25 DIAGNOSIS — I4891 Unspecified atrial fibrillation: Secondary | ICD-10-CM | POA: Diagnosis not present

## 2016-05-25 NOTE — Patient Instructions (Signed)
Medication Instructions:  STOP LOSARTAN   START USING FUROSEMIDE 40 MG AS NEEDED FOR WEIGHT GAIN OF 2 POUNDS OVER NIGHT OR 5 POUNDS IN A WEEK   Labwork: NONE  Testing/Procedures: NONE  Follow-Up: Your physician recommends that you schedule a follow-up appointment in:  2 WEEKS WITH PA/NP  Keep follow up as scheduled with Dr [name]  Any Other Special Instructions Will Be Listed Below (If Applicable).  You are scheduled for a cardioversion on 06/04/16 with Dr. [name] or associate.  Go to [facility] 2nd Floor Short Stay on 6/5/17arrive at 11:30.  Enter thru the [employer] entrance A No food or drink 8 hours prior You may take your medications with a sip of water on the day of your procedure.

## 2016-05-27 DIAGNOSIS — M6281 Muscle weakness (generalized): Secondary | ICD-10-CM | POA: Diagnosis not present

## 2016-05-27 DIAGNOSIS — I2510 Atherosclerotic heart disease of native coronary artery without angina pectoris: Secondary | ICD-10-CM | POA: Diagnosis not present

## 2016-05-27 DIAGNOSIS — I4891 Unspecified atrial fibrillation: Secondary | ICD-10-CM | POA: Diagnosis not present

## 2016-05-27 DIAGNOSIS — G8929 Other chronic pain: Secondary | ICD-10-CM | POA: Diagnosis not present

## 2016-05-27 DIAGNOSIS — I130 Hypertensive heart and chronic kidney disease with heart failure and stage 1 through stage 4 chronic kidney disease, or unspecified chronic kidney disease: Secondary | ICD-10-CM | POA: Diagnosis not present

## 2016-05-27 DIAGNOSIS — E119 Type 2 diabetes mellitus without complications: Secondary | ICD-10-CM | POA: Diagnosis not present

## 2016-05-29 ENCOUNTER — Other Ambulatory Visit: Payer: Self-pay | Admitting: *Deleted

## 2016-05-29 DIAGNOSIS — I2510 Atherosclerotic heart disease of native coronary artery without angina pectoris: Secondary | ICD-10-CM | POA: Diagnosis not present

## 2016-05-29 DIAGNOSIS — G8929 Other chronic pain: Secondary | ICD-10-CM | POA: Diagnosis not present

## 2016-05-29 DIAGNOSIS — I4891 Unspecified atrial fibrillation: Secondary | ICD-10-CM | POA: Diagnosis not present

## 2016-05-29 DIAGNOSIS — I5022 Chronic systolic (congestive) heart failure: Secondary | ICD-10-CM | POA: Diagnosis not present

## 2016-05-29 DIAGNOSIS — E1122 Type 2 diabetes mellitus with diabetic chronic kidney disease: Secondary | ICD-10-CM | POA: Diagnosis not present

## 2016-05-29 DIAGNOSIS — I130 Hypertensive heart and chronic kidney disease with heart failure and stage 1 through stage 4 chronic kidney disease, or unspecified chronic kidney disease: Secondary | ICD-10-CM | POA: Diagnosis not present

## 2016-05-29 DIAGNOSIS — F419 Anxiety disorder, unspecified: Secondary | ICD-10-CM | POA: Diagnosis not present

## 2016-05-29 DIAGNOSIS — E119 Type 2 diabetes mellitus without complications: Secondary | ICD-10-CM | POA: Diagnosis not present

## 2016-05-29 DIAGNOSIS — M6281 Muscle weakness (generalized): Secondary | ICD-10-CM | POA: Diagnosis not present

## 2016-05-29 DIAGNOSIS — Z794 Long term (current) use of insulin: Secondary | ICD-10-CM | POA: Diagnosis not present

## 2016-05-29 DIAGNOSIS — F339 Major depressive disorder, recurrent, unspecified: Secondary | ICD-10-CM | POA: Diagnosis not present

## 2016-05-29 DIAGNOSIS — M059 Rheumatoid arthritis with rheumatoid factor, unspecified: Secondary | ICD-10-CM | POA: Diagnosis not present

## 2016-05-29 MED ORDER — METOPROLOL TARTRATE 75 MG PO TABS: 75.0000 mg | ORAL_TABLET | Freq: Two times a day (BID) | ORAL | Status: DC

## 2016-05-29 NOTE — Telephone Encounter (Signed)
It was requested that this be sent in today.

## 2016-05-30 DIAGNOSIS — I4891 Unspecified atrial fibrillation: Secondary | ICD-10-CM | POA: Diagnosis not present

## 2016-05-30 DIAGNOSIS — I130 Hypertensive heart and chronic kidney disease with heart failure and stage 1 through stage 4 chronic kidney disease, or unspecified chronic kidney disease: Secondary | ICD-10-CM | POA: Diagnosis not present

## 2016-05-30 DIAGNOSIS — I2510 Atherosclerotic heart disease of native coronary artery without angina pectoris: Secondary | ICD-10-CM | POA: Diagnosis not present

## 2016-05-30 DIAGNOSIS — G8929 Other chronic pain: Secondary | ICD-10-CM | POA: Diagnosis not present

## 2016-05-30 DIAGNOSIS — E119 Type 2 diabetes mellitus without complications: Secondary | ICD-10-CM | POA: Diagnosis not present

## 2016-05-30 DIAGNOSIS — M6281 Muscle weakness (generalized): Secondary | ICD-10-CM | POA: Diagnosis not present

## 2016-05-31 DIAGNOSIS — I4891 Unspecified atrial fibrillation: Secondary | ICD-10-CM | POA: Diagnosis not present

## 2016-05-31 DIAGNOSIS — G8929 Other chronic pain: Secondary | ICD-10-CM | POA: Diagnosis not present

## 2016-05-31 DIAGNOSIS — I130 Hypertensive heart and chronic kidney disease with heart failure and stage 1 through stage 4 chronic kidney disease, or unspecified chronic kidney disease: Secondary | ICD-10-CM | POA: Diagnosis not present

## 2016-05-31 DIAGNOSIS — E119 Type 2 diabetes mellitus without complications: Secondary | ICD-10-CM | POA: Diagnosis not present

## 2016-05-31 DIAGNOSIS — I2510 Atherosclerotic heart disease of native coronary artery without angina pectoris: Secondary | ICD-10-CM | POA: Diagnosis not present

## 2016-05-31 DIAGNOSIS — M6281 Muscle weakness (generalized): Secondary | ICD-10-CM | POA: Diagnosis not present

## 2016-06-04 ENCOUNTER — Encounter (HOSPITAL_COMMUNITY)
Admission: RE | Disposition: A | Discharge status: Home / Self Care | Payer: Self-pay | Source: Ambulatory Visit | Attending: Cardiovascular Disease

## 2016-06-04 ENCOUNTER — Ambulatory Visit (HOSPITAL_COMMUNITY)
Admission: RE | Admit: 2016-06-04 | Discharge: 2016-06-04 | Disposition: A | Discharge status: Home / Self Care | Payer: Medicare Other | Source: Ambulatory Visit | Attending: Cardiovascular Disease | Admitting: Cardiovascular Disease

## 2016-06-04 ENCOUNTER — Ambulatory Visit (HOSPITAL_COMMUNITY): Payer: Medicare Other | Admitting: Certified Registered Nurse Anesthetist

## 2016-06-04 ENCOUNTER — Encounter (HOSPITAL_COMMUNITY): Payer: Self-pay | Admitting: Certified Registered Nurse Anesthetist

## 2016-06-04 DIAGNOSIS — Z7901 Long term (current) use of anticoagulants: Secondary | ICD-10-CM | POA: Diagnosis not present

## 2016-06-04 DIAGNOSIS — I5042 Chronic combined systolic (congestive) and diastolic (congestive) heart failure: Secondary | ICD-10-CM | POA: Diagnosis not present

## 2016-06-04 DIAGNOSIS — I4891 Unspecified atrial fibrillation: Secondary | ICD-10-CM | POA: Diagnosis present

## 2016-06-04 DIAGNOSIS — I2510 Atherosclerotic heart disease of native coronary artery without angina pectoris: Secondary | ICD-10-CM | POA: Diagnosis not present

## 2016-06-04 DIAGNOSIS — I130 Hypertensive heart and chronic kidney disease with heart failure and stage 1 through stage 4 chronic kidney disease, or unspecified chronic kidney disease: Secondary | ICD-10-CM | POA: Insufficient documentation

## 2016-06-04 DIAGNOSIS — N183 Chronic kidney disease, stage 3 (moderate): Secondary | ICD-10-CM | POA: Diagnosis not present

## 2016-06-04 DIAGNOSIS — Z955 Presence of coronary angioplasty implant and graft: Secondary | ICD-10-CM | POA: Insufficient documentation

## 2016-06-04 DIAGNOSIS — I255 Ischemic cardiomyopathy: Secondary | ICD-10-CM | POA: Diagnosis not present

## 2016-06-04 DIAGNOSIS — N179 Acute kidney failure, unspecified: Secondary | ICD-10-CM | POA: Diagnosis not present

## 2016-06-04 DIAGNOSIS — E1122 Type 2 diabetes mellitus with diabetic chronic kidney disease: Secondary | ICD-10-CM | POA: Insufficient documentation

## 2016-06-04 DIAGNOSIS — Z794 Long term (current) use of insulin: Secondary | ICD-10-CM | POA: Diagnosis not present

## 2016-06-04 DIAGNOSIS — Z880 Allergy status to penicillin: Secondary | ICD-10-CM | POA: Diagnosis not present

## 2016-06-04 DIAGNOSIS — I252 Old myocardial infarction: Secondary | ICD-10-CM | POA: Diagnosis not present

## 2016-06-04 DIAGNOSIS — I480 Paroxysmal atrial fibrillation: Secondary | ICD-10-CM | POA: Insufficient documentation

## 2016-06-04 DIAGNOSIS — I129 Hypertensive chronic kidney disease with stage 1 through stage 4 chronic kidney disease, or unspecified chronic kidney disease: Secondary | ICD-10-CM | POA: Diagnosis not present

## 2016-06-04 HISTORY — PX: CARDIOVERSION: SHX1299

## 2016-06-04 LAB — POCT I-STAT 4, (NA,K, GLUC, HGB,HCT)
GLUCOSE: 174 mg/dL — AB (ref 65–99)
HEMATOCRIT: 32 % — AB (ref 39.0–52.0)
Hemoglobin: 10.9 g/dL — ABNORMAL LOW (ref 13.0–17.0)
Potassium: 3.8 mmol/L (ref 3.5–5.1)
Sodium: 141 mmol/L (ref 135–145)

## 2016-06-04 SURGERY — CARDIOVERSION
Anesthesia: Monitor Anesthesia Care

## 2016-06-04 MED ORDER — LIDOCAINE 2% (20 MG/ML) 5 ML SYRINGE
INTRAMUSCULAR | Status: DC | PRN
  Administered 2016-06-04: 100 mg via INTRAVENOUS

## 2016-06-04 MED ORDER — SODIUM CHLORIDE 0.9 % IV SOLN
INTRAVENOUS | Status: DC | PRN
  Administered 2016-06-04: 13:00:00 via INTRAVENOUS

## 2016-06-04 MED ORDER — PROPOFOL 10 MG/ML IV BOLUS
INTRAVENOUS | Status: DC | PRN
  Administered 2016-06-04: 100 mg via INTRAVENOUS

## 2016-06-04 NOTE — CV Procedure (Signed)
Electrical Cardioversion Procedure Note Julian Ross [id number] 1946/01/30  Procedure: Electrical Cardioversion Indications:  Atrial Fibrillation  Procedure Details Consent: Risks of procedure as well as the alternatives and risks of each were explained to the (patient/caregiver).  Consent for procedure obtained. Time Out: Verified patient identification, verified procedure, site/side was marked, verified correct patient position, special equipment/implants available, medications/allergies/relevent history reviewed, required imaging and test results available.  Performed  Patient placed on cardiac monitor, pulse oximetry, supplemental oxygen as necessary.  Sedation given: propofol  Pacer pads placed anterior and posterior chest.  Cardioverted 1 time(s).  Cardioverted at 150J.  Evaluation Findings: Post procedure EKG shows: sinus bradycardia followed by sinus rhythm  Complications: None Patient did tolerate procedure well.   Julian Ross C. [name], MD, [facility]   06/04/2016, 1:15 PM

## 2016-06-04 NOTE — Anesthesia Postprocedure Evaluation (Signed)
Anesthesia Post Note  Patient: Julian Ross  Procedure(s) Performed: Procedure(s) (LRB): CARDIOVERSION (N/A)  Patient location during evaluation: Endoscopy Anesthesia Type: General Level of consciousness: awake, awake and alert, oriented and patient cooperative Pain management: pain level controlled Vital Signs Assessment: post-procedure vital signs reviewed and stable Respiratory status: spontaneous breathing, nonlabored ventilation, respiratory function stable and patient connected to nasal cannula oxygen Cardiovascular status: blood pressure returned to baseline and stable Postop Assessment: no headache, no backache and no signs of nausea or vomiting Anesthetic complications: no    Last Vitals:  Filed Vitals:   06/04/16 1118  BP: 134/86  Pulse: 64  Temp: 36.9 C  Resp: 13    Last Pain: There were no vitals filed for this visit.               Ned Grace

## 2016-06-04 NOTE — H&P (View-Only) (Signed)
Cardiology Office Note   Date:  05/25/2016   ID:  Julian Ross, DOB Dec 31, 1946, MRN [id number]  PCP:  Moshe Cipro, MD  Cardiologist:   Skeet Latch, MD   Chief Complaint  Patient presents with  . Follow-up    sob; this morning, havent experienced it any other time. edema; randomly in legs.     History of Present Illness: Julian Ross is a 70 y.o. 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  During that hospitalization he also had elevated troponin consistent with demand ischemia (troponin 0.13) and new onset atrial fibrillation with RVR.  He was diuresed to a discharge weight of 196 lb (from 208 lb on admit).  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  He previously had 16 stents placed in [place], [place].  His cardiologist there is [name].  The inpatient team contacted [name], who informed them that Julian Ross has a tight RCA lesion but no recent interventions.  He felt that Julian Ross could transition from Plavix to aspirin.  He has been intolerant to statins in the past.   At his last appointment [name] noted fatigue and an episode of syncope.  He BP was low in the office,so hydralazine and isosorbide were discontinued. Basic metabolic panel was obtained which revealed a potassium of 6 and creatinine was 2.26 up from 1.2 in the hospital. He was instructed to stop lisinopril and losartan. He has continued to take losartan and reduce the metoprolol dose in half. He did stop taking Lasix, although he took one 20 mg dose yesterday he developed lower extremity edema. Overall he has felt fairly well. However he has noted persistent fatigue. His breathing has been okay. He denies orthopnea or PND.  He does not weigh  himself regularly.  Past Medical History  Diagnosis Date  . MI (myocardial infarction) ([place])   . Diabetes mellitus type 2 in obese ([place])   . Hypertension   . Septic shock ([place])   . CKD (chronic kidney disease), stage III   . Edema of leg   . Elevated troponin   . Anemia   . Acute hypoxemic respiratory failure ([place])   . Altered mental status   . Atrial fibrillation, new onset ([place])   . AKI (acute kidney injury) ([place])   . Acute respiratory failure with hypoxia ([place])   . Chronic systolic CHF (congestive heart failure) ([place])   . Cardiomyopathy, ischemic   . Demand ischemia ([place])   . Acute renal failure superimposed on stage 3 chronic kidney disease ([place])   . Hypokalemia   . Rheumatoid arthritis of multiple sites with negative rheumatoid factor ([place])   . Acute encephalopathy   . Anxiety state     Past Surgical History  Procedure Laterality Date  . Coronary angioplasty with stent placement      x 10  . Angioplasty       Current Outpatient Prescriptions  Medication Sig Dispense Refill  . furosemide (LASIX) 40 MG tablet Take 40 mg by mouth as directed. 1 tablet by mouth as needed for weight of 2 pounds overnight or 5 in 1 week    . acetaminophen (TYLENOL) 325 MG tablet Take 650 mg by mouth every 4 (four) hours as needed for mild pain or headache.    Marland Kitchen  apixaban (ELIQUIS) 5 MG TABS tablet Take 1 tablet (5 mg total) by mouth 2 (two) times daily. 60 tablet   . azithromycin (ZITHROMAX) 250 MG tablet 2 tablets by mouth on day 1 and then 1 daily until finished 6 each 0  . folic acid (FOLVITE) 1 MG tablet Take 1 mg by mouth daily.    Marland Kitchen gabapentin (NEURONTIN) 100 MG capsule Take 1 capsule (100 mg total) by mouth daily. 30 capsule 0  . hydrocortisone (ANUSOL-HC) 2.5 % rectal cream Place 1 application rectally 2 (two) times daily as needed for hemorrhoids or itching.    . insulin glargine (LANTUS) 100 UNIT/ML injection Inject 0.05 mLs (5 Units total) into the skin daily. 10 mL 11  . insulin  lispro protamine-lispro (HUMALOG 50/50 MIX) (50-50) 100 UNIT/ML SUSP injection Inject 40-50 Units into the skin 3 (three) times daily with meals. Inject 50 units subcutaneously with breakfast and 40 units with lunch & supper    . leucovorin (WELLCOVORIN) 10 MG tablet Take 10 mg by mouth See admin instructions. Take 1 tablet (10 mg) by mouth every 12 hours and 24 hours after the methotrexate dose    . lidocaine (LIDODERM) 5 % Place 0.5 patches onto the skin daily. Remove & Discard patch within 12 hours or as directed by MD.  Apply to lower back and left shoulder QHS    . LUMIGAN 0.01 % SOLN Place 1 drop into both eyes daily.  0  . magnesium oxide (MAG-OX) 400 (241.3 Mg) MG tablet Take 1 tablet (400 mg total) by mouth 2 (two) times daily. 60 tablet 0  . Methotrexate Sodium (METHOTREXATE, PF,) 50 MG/2ML injection Inject 15 mg into the muscle once a week. Inject 0.6 ml (15 mg) - on Fridays.  Must be given the same time every administration  0  . metoprolol 75 MG TABS Take 75 mg by mouth 2 (two) times daily.    . Misc Natural Products (OSTEO BI-FLEX ADV DOUBLE ST PO) Take 1 tablet by mouth 2 (two) times daily.    Marland Kitchen PARoxetine (PAXIL) 40 MG tablet Take 1 tablet by mouth daily.  11  . predniSONE (DELTASONE) 5 MG tablet Take 5 mg by mouth daily with breakfast.    . Protein (PROCEL 100) POWD Take 2 scoop by mouth 2 (two) times daily.    . tamsulosin (FLOMAX) 0.4 MG CAPS capsule Take 0.4 mg by mouth daily.   4  . topiramate (TOPAMAX) 25 MG tablet Take 25 mg by mouth 2 (two) times daily.     Current Facility-Administered Medications  Medication Dose Route Frequency Provider Last Rate Last Dose  . 0.9 %  sodium chloride infusion  250 mL Intravenous Continuous Skeet Latch, MD      . sodium chloride flush (NS) 0.9 % injection 3 mL  3 mL Intravenous Q12H Skeet Latch, MD      . sodium chloride flush (NS) 0.9 % injection 3 mL  3 mL Intravenous PRN Skeet Latch, MD        Allergies:   Ace  inhibitors; Atenolol; Contrast media; and Penicillins    Social History:  The patient  reports that he has never smoked. He does not have any smokeless tobacco history on file. He reports that he does not drink alcohol or use illicit drugs.   Family History:  The patient's family history includes Heart attack in his brother and father; Heart failure in his mother; Hyperlipidemia in his mother; Hypertension in his mother.  ROS:  Please see the history of present illness.   Otherwise, review of systems are positive for none.   All other systems are reviewed and negative.    PHYSICAL EXAM: VS:  BP 143/92 mmHg  Pulse 56  Ht 5\' 11"  (1.803 m)  Wt 87.726 kg (193 lb 6.4 oz)  BMI 26.99 kg/m2 , BMI Body mass index is 26.99 kg/(m^2). GENERAL:  Well appearing HEENT:  Pupils equal round and reactive, fundi not visualized, oral mucosa unremarkable NECK:  No jugular venous distention, waveform within normal limits, carotid upstroke brisk and symmetric, no bruits LYMPHATICS:  No cervical adenopathy LUNGS:  Rhonchi at right base. HEART:  RRR.  PMI not displaced or sustained,S1 and S2 within normal limits, no S3, no S4, no clicks, no rubs, no murmurs ABD:  Flat, positive bowel sounds normal in frequency in pitch, no bruits, no rebound, no guarding, no midline pulsatile mass, no hepatomegaly, no splenomegaly EXT:  2 plus pulses throughout, no edema, no cyanosis no clubbing SKIN:  No rashes no nodules NEURO:  Cranial nerves II through XII grossly intact, motor grossly intact throughout PSYCH:  Cognitively intact, oriented to person place and time   EKG:  EKG is not ordered today. The ekg ordered 05/22/16 demonstrates atrial fibrillation rate 68 bpm.     Recent Labs: 03/18/2016: TSH 0.501 03/19/2016: B Natriuretic Peptide 385.9* 04/01/2016: ALT 42 04/02/2016: Magnesium 2.3 05/21/2016: Hemoglobin 12.8*; Platelets 335 05/22/2016: BUN 31*; Creat 1.86*; Potassium 4.2; Sodium 136    Lipid Panel      Component Value Date/Time   CHOL 123 03/22/2016 0320   TRIG 154* 03/22/2016 0320   HDL 25* 03/22/2016 0320   CHOLHDL 4.9 03/22/2016 0320   VLDL 31 03/22/2016 0320   LDLCALC 67 03/22/2016 0320      Wt Readings from Last 3 Encounters:  05/25/16 87.726 kg (193 lb 6.4 oz)  05/21/16 93.985 kg (207 lb 3.2 oz)  04/19/16 91.627 kg (202 lb)      ASSESSMENT AND PLAN:  # Chronic systolic and diastolic heart failure:   # Acute renal failure: 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  However he had lower extremity edema yesterday that improved with Lasix. I suspect that his acute renal failure is due to overdiuresis. I have asked him to start weighing himself daily. If his weight goes up by more than 2 pounds in one day or 5 pounds in one week, he will take Lasix 40 mg that day. We will stop losartan due to acute renal failure that has not yet returned to baseline. He will continue metoprolol 75 mg twice daily.  His LVEF was reduced on the recent hospitalization, which may have been due to sepsis.  We will repeat the echo at the end of June.  If the LVEF is not >35%, he will need to be considered for ICD.  We will try to restart an ARB and consider spironolactone once his renal function stabilizes.  # Hypertension/hypotension: BP is no longer low.  He will continue metoprolol and hold losartan as above. Return in 2 weeks for an assessment of his basic metabolic panel and magnesium to determine whether his ARB can be restarted.   # Cough: Improving pneumonitis seen on CXR.  No pulmonary edema.  # Atrial fibrillation: He remains in atrial fibrillation today but rate is well-controlled.  Continue Eliquis and metoprolol   Rescheduled for 06/04/16.  He has been on anticoagulation >1 week.  This patients CHA2DS2-VASc Score and unadjusted Ischemic Stroke Rate (% per year) is equal to 4.8 % stroke rate/year from a score of 4  Above score calculated as 1 point each if present  [CHF, HTN, DM, Vascular=MI/PAD/Aortic Plaque, Age if 65-74, or Male] Above score calculated as 2 points each if present [Age > 75, or Stroke/TIA/TE]  # NSTEMI: [name] had a mildly elevated troponin that seems consistent with demand ischemia.  He does not have any symptoms of angina at this time.  Consider stress testing if he develops symptoms.      Current medicines are reviewed at length with the patient today.  The patient does not have concerns regarding medicines.  The following changes have been made:  no change  Labs/ tests ordered today include: stop hydralazine and Imdur   No orders of the defined types were placed in this encounter.     Disposition:   FU with Julian Hack C. [name], MD, [facility] in 2 months   This note was written with the assistance of speech recognition software.  Please excuse any transcriptional errors.  Signed, Ramar Nobrega C. [name], MD, [facility]  05/25/2016 12:40 PM    [place]

## 2016-06-04 NOTE — Transfer of Care (Signed)
Immediate Anesthesia Transfer of Care Note  Patient: Julian Ross  Procedure(s) Performed: Procedure(s): CARDIOVERSION (N/A)  Patient Location: PACU and Endoscopy Unit  Anesthesia Type:General  Level of Consciousness: awake, alert , oriented and patient cooperative  Airway & Oxygen Therapy: Patient Spontanous Breathing and Patient connected to nasal cannula oxygen  Post-op Assessment: Report given to RN, Post -op Vital signs reviewed and stable and Patient moving all extremities  Post vital signs: Reviewed and stable  Last Vitals:  Filed Vitals:   06/04/16 1118  BP: 134/86  Pulse: 64  Temp: 36.9 C  Resp: 13    Last Pain: There were no vitals filed for this visit.       Complications: No apparent anesthesia complications

## 2016-06-04 NOTE — Anesthesia Procedure Notes (Signed)
Date/Time: 06/04/2016 1:07 PM Performed by: Lowella Dell Pre-anesthesia Checklist: Emergency Drugs available, Patient identified, Suction available, Patient being monitored and Timeout performed Patient Re-evaluated:Patient Re-evaluated prior to inductionOxygen Delivery Method: Ambu bag Preoxygenation: Pre-oxygenation with 100% oxygen Intubation Type: IV induction Ventilation: Mask ventilation without difficulty Dental Injury: Teeth and Oropharynx as per pre-operative assessment

## 2016-06-04 NOTE — Interval H&P Note (Signed)
History and Physical Interval Note:  06/04/2016 1:01 PM  Julian Ross  has presented today for surgery, with the diagnosis of A FIB  The various methods of treatment have been discussed with the patient and family. After consideration of risks, benefits and other options for treatment, the patient has consented to  Procedure(s): CARDIOVERSION (N/A) as a surgical intervention .  The patient's history has been reviewed, patient examined, no change in status, stable for surgery.  I have reviewed the patient's chart and labs.  Questions were answered to the patient's satisfaction.     Charman Blasco C. [name], MD, [facility]  06/04/2016  1:01 PM

## 2016-06-04 NOTE — Discharge Instructions (Signed)
Electrical Cardioversion, Care After °Refer to this sheet in the next few weeks. These instructions provide you with information on caring for yourself after your procedure. Your health care provider may also give you more specific instructions. Your treatment has been planned according to current medical practices, but problems sometimes occur. Call your health care provider if you have any problems or questions after your procedure. °WHAT TO EXPECT AFTER THE PROCEDURE °After your procedure, it is typical to have the following sensations: °· Some redness on the skin where the shocks were delivered. If this is tender, a sunburn lotion or hydrocortisone cream may help. °· Possible return of an abnormal heart rhythm within hours or days after the procedure. °HOME CARE INSTRUCTIONS °· Take medicines only as directed by your health care provider. Be sure you understand how and when to take your medicine. °· Learn how to feel your pulse and check it often. °· Limit your activity for 48 hours after the procedure or as directed by your health care provider. °· Avoid or minimize caffeine and other stimulants as directed by your health care provider. °SEEK MEDICAL CARE IF: °· You feel like your heart is beating too fast or your pulse is not regular. °· You have any questions about your medicines. °· You have bleeding that will not stop. °SEEK IMMEDIATE MEDICAL CARE IF: °· You are dizzy or feel faint. °· It is hard to breathe or you feel short of breath. °· There is a change in discomfort in your chest. °· Your speech is slurred or you have trouble moving an arm or leg on one side of your body. °· You get a serious muscle cramp that does not go away. °· Your fingers or toes turn cold or blue. °  °This information is not intended to replace advice given to you by your health care provider. Make sure you discuss any questions you have with your health care provider. °  °Document Released: 10/07/2013 Document Revised: 01/07/2015  Document Reviewed: 10/07/2013 °Elsevier Interactive Patient Education ©2016 [employer] ° °

## 2016-06-04 NOTE — Anesthesia Preprocedure Evaluation (Signed)
Anesthesia Evaluation  Patient identified by MRN, date of birth, ID band Patient awake    Reviewed: Allergy & Precautions, NPO status , Patient's Chart, lab work & pertinent test results  Airway Mallampati: I  TM Distance: >3 FB Neck ROM: Full    Dental   Pulmonary    Pulmonary exam normal        Cardiovascular hypertension, Pt. on medications + CAD and + Past MI  Normal cardiovascular exam+ dysrhythmias Atrial Fibrillation      Neuro/Psych Anxiety    GI/Hepatic   Endo/Other  diabetes, Type 2, Oral Hypoglycemic Agents  Renal/GU Renal InsufficiencyRenal disease     Musculoskeletal   Abdominal   Peds  Hematology   Anesthesia Other Findings   Reproductive/Obstetrics                             Anesthesia Physical Anesthesia Plan  ASA: III  Anesthesia Plan: General   Post-op Pain Management:    Induction: Intravenous  Airway Management Planned: Mask  Additional Equipment:   Intra-op Plan:   Post-operative Plan:   Informed Consent: I have reviewed the patients History and Physical, chart, labs and discussed the procedure including the risks, benefits and alternatives for the proposed anesthesia with the patient or authorized representative who has indicated his/her understanding and acceptance.     Plan Discussed with: CRNA and Surgeon  Anesthesia Plan Comments:         Anesthesia Quick Evaluation

## 2016-06-05 ENCOUNTER — Encounter (HOSPITAL_COMMUNITY): Payer: Self-pay | Admitting: Cardiovascular Disease

## 2016-06-06 DIAGNOSIS — M6281 Muscle weakness (generalized): Secondary | ICD-10-CM | POA: Diagnosis not present

## 2016-06-06 DIAGNOSIS — I2510 Atherosclerotic heart disease of native coronary artery without angina pectoris: Secondary | ICD-10-CM | POA: Diagnosis not present

## 2016-06-06 DIAGNOSIS — E119 Type 2 diabetes mellitus without complications: Secondary | ICD-10-CM | POA: Diagnosis not present

## 2016-06-06 DIAGNOSIS — G8929 Other chronic pain: Secondary | ICD-10-CM | POA: Diagnosis not present

## 2016-06-06 DIAGNOSIS — I130 Hypertensive heart and chronic kidney disease with heart failure and stage 1 through stage 4 chronic kidney disease, or unspecified chronic kidney disease: Secondary | ICD-10-CM | POA: Diagnosis not present

## 2016-06-06 DIAGNOSIS — I4891 Unspecified atrial fibrillation: Secondary | ICD-10-CM | POA: Diagnosis not present

## 2016-06-08 DIAGNOSIS — G8929 Other chronic pain: Secondary | ICD-10-CM | POA: Diagnosis not present

## 2016-06-08 DIAGNOSIS — I4891 Unspecified atrial fibrillation: Secondary | ICD-10-CM | POA: Diagnosis not present

## 2016-06-08 DIAGNOSIS — M6281 Muscle weakness (generalized): Secondary | ICD-10-CM | POA: Diagnosis not present

## 2016-06-08 DIAGNOSIS — E119 Type 2 diabetes mellitus without complications: Secondary | ICD-10-CM | POA: Diagnosis not present

## 2016-06-08 DIAGNOSIS — I2510 Atherosclerotic heart disease of native coronary artery without angina pectoris: Secondary | ICD-10-CM | POA: Diagnosis not present

## 2016-06-08 DIAGNOSIS — I130 Hypertensive heart and chronic kidney disease with heart failure and stage 1 through stage 4 chronic kidney disease, or unspecified chronic kidney disease: Secondary | ICD-10-CM | POA: Diagnosis not present

## 2016-06-11 DIAGNOSIS — M79672 Pain in left foot: Secondary | ICD-10-CM | POA: Diagnosis not present

## 2016-06-11 DIAGNOSIS — G629 Polyneuropathy, unspecified: Secondary | ICD-10-CM | POA: Diagnosis not present

## 2016-06-11 DIAGNOSIS — G8929 Other chronic pain: Secondary | ICD-10-CM | POA: Diagnosis not present

## 2016-06-11 DIAGNOSIS — M0579 Rheumatoid arthritis with rheumatoid factor of multiple sites without organ or systems involvement: Secondary | ICD-10-CM | POA: Diagnosis not present

## 2016-06-11 DIAGNOSIS — S23122A Subluxation of T3/T4 thoracic vertebra, initial encounter: Secondary | ICD-10-CM | POA: Diagnosis not present

## 2016-06-11 DIAGNOSIS — S332XXA Dislocation of sacroiliac and sacrococcygeal joint, initial encounter: Secondary | ICD-10-CM | POA: Diagnosis not present

## 2016-06-11 DIAGNOSIS — M79671 Pain in right foot: Secondary | ICD-10-CM | POA: Diagnosis not present

## 2016-06-11 DIAGNOSIS — M9902 Segmental and somatic dysfunction of thoracic region: Secondary | ICD-10-CM | POA: Diagnosis not present

## 2016-06-11 DIAGNOSIS — Z79899 Other long term (current) drug therapy: Secondary | ICD-10-CM | POA: Diagnosis not present

## 2016-06-11 DIAGNOSIS — M9904 Segmental and somatic dysfunction of sacral region: Secondary | ICD-10-CM | POA: Diagnosis not present

## 2016-06-11 DIAGNOSIS — E559 Vitamin D deficiency, unspecified: Secondary | ICD-10-CM | POA: Diagnosis not present

## 2016-06-13 DIAGNOSIS — M6281 Muscle weakness (generalized): Secondary | ICD-10-CM | POA: Diagnosis not present

## 2016-06-13 DIAGNOSIS — E119 Type 2 diabetes mellitus without complications: Secondary | ICD-10-CM | POA: Diagnosis not present

## 2016-06-13 DIAGNOSIS — I130 Hypertensive heart and chronic kidney disease with heart failure and stage 1 through stage 4 chronic kidney disease, or unspecified chronic kidney disease: Secondary | ICD-10-CM | POA: Diagnosis not present

## 2016-06-13 DIAGNOSIS — G8929 Other chronic pain: Secondary | ICD-10-CM | POA: Diagnosis not present

## 2016-06-13 DIAGNOSIS — I2510 Atherosclerotic heart disease of native coronary artery without angina pectoris: Secondary | ICD-10-CM | POA: Diagnosis not present

## 2016-06-13 DIAGNOSIS — I4891 Unspecified atrial fibrillation: Secondary | ICD-10-CM | POA: Diagnosis not present

## 2016-06-15 ENCOUNTER — Telehealth: Payer: Self-pay | Admitting: *Deleted

## 2016-06-15 DIAGNOSIS — M6281 Muscle weakness (generalized): Secondary | ICD-10-CM | POA: Diagnosis not present

## 2016-06-15 DIAGNOSIS — G8929 Other chronic pain: Secondary | ICD-10-CM | POA: Diagnosis not present

## 2016-06-15 DIAGNOSIS — E119 Type 2 diabetes mellitus without complications: Secondary | ICD-10-CM | POA: Diagnosis not present

## 2016-06-15 DIAGNOSIS — I130 Hypertensive heart and chronic kidney disease with heart failure and stage 1 through stage 4 chronic kidney disease, or unspecified chronic kidney disease: Secondary | ICD-10-CM | POA: Diagnosis not present

## 2016-06-15 DIAGNOSIS — I4891 Unspecified atrial fibrillation: Secondary | ICD-10-CM | POA: Diagnosis not present

## 2016-06-15 DIAGNOSIS — I2510 Atherosclerotic heart disease of native coronary artery without angina pectoris: Secondary | ICD-10-CM | POA: Diagnosis not present

## 2016-06-15 NOTE — Telephone Encounter (Signed)
Recommendations communicated to patient. He will adjust metoprolol dosage to 50mg  BID and call if further concerns. He is aware to f/u as scheduled w Suanne Marker on Tuesday 6/20.

## 2016-06-15 NOTE — Telephone Encounter (Signed)
Spoke w/ [name], a patient of Dr. [name]. He had recent cardioversion on 06/04/16 and went home. He's had a normal baseline heart rate of 60, is diligent about checking his pulse daily. Notes he felt a little fatigued yesterday. HR was 60 yesterday. When he checked this morning, he's had a consistent rate of 48-56 (at rest-after activity), which he has checked periodically over the course of an hour. He is having fatigue, but no other specific symptoms.  Patient is asking if recommendation to hold/adjust dose of his metoprolol. His recent BP readings have been consistently 120s/70s-80s.  He notes his losartan was discontinued post-cardioversion in context of other medication adjustments.  Of note, he has a scheduled follow up with Suanne Marker on 6/20. Patient aware I will send to Dr. Sallyanne Kuster ([facility]) for review and advice.

## 2016-06-15 NOTE — Telephone Encounter (Signed)
Please reduce metoprolol to 50 mg twice daily

## 2016-06-15 NOTE — Telephone Encounter (Signed)
called for bradycardia, will transfer to triage.

## 2016-06-15 NOTE — Telephone Encounter (Signed)
Routed to triage - my personal basket should not be used for triage calls.

## 2016-06-18 ENCOUNTER — Telehealth: Payer: Self-pay | Admitting: Cardiovascular Disease

## 2016-06-18 MED ORDER — CARVEDILOL 12.5 MG PO TABS: 12.5000 mg | ORAL_TABLET | Freq: Two times a day (BID) | ORAL | Status: DC

## 2016-06-18 NOTE — Telephone Encounter (Signed)
Patient is concerned about his heart rate and blood pressure Blood pressure 170-180/90's and heart rate 40's-50's  On 6/16 patients Metoprolol was decreased to 50 mg twice a day  Will forward to Dr [name] for review

## 2016-06-18 NOTE — Telephone Encounter (Signed)
Advised patient, verbalized understanding  Will keep follow up as scheduled

## 2016-06-18 NOTE — Telephone Encounter (Signed)
Goes to voice mail. Left msg for patient to call.

## 2016-06-18 NOTE — Telephone Encounter (Signed)
Would suggest switching to carvedilol 12.5 mg bid and stop metoprolol.

## 2016-06-18 NOTE — Telephone Encounter (Signed)
F/u  Pt stated his HR is not doing any better after adjusting med from last week. Pt requested to speak w/ RN- has appt sched for tomorrow 6/20 w/ Suanne Marker. Please call back and discuss.

## 2016-06-18 NOTE — Telephone Encounter (Signed)
F/u  Pt returning RN phone call. Please call back and discuss.   

## 2016-06-19 ENCOUNTER — Ambulatory Visit (INDEPENDENT_AMBULATORY_CARE_PROVIDER_SITE_OTHER): Payer: Medicare Other | Admitting: Physician Assistant

## 2016-06-19 ENCOUNTER — Encounter: Payer: Self-pay | Admitting: Physician Assistant

## 2016-06-19 VITALS — BP 120/62 | HR 48 | Ht 71.0 in | Wt 212.0 lb

## 2016-06-19 DIAGNOSIS — I4891 Unspecified atrial fibrillation: Secondary | ICD-10-CM | POA: Diagnosis not present

## 2016-06-19 DIAGNOSIS — I255 Ischemic cardiomyopathy: Secondary | ICD-10-CM

## 2016-06-19 DIAGNOSIS — R001 Bradycardia, unspecified: Secondary | ICD-10-CM

## 2016-06-19 DIAGNOSIS — I10 Essential (primary) hypertension: Secondary | ICD-10-CM

## 2016-06-19 DIAGNOSIS — Z79899 Other long term (current) drug therapy: Secondary | ICD-10-CM | POA: Diagnosis not present

## 2016-06-19 DIAGNOSIS — N183 Chronic kidney disease, stage 3 unspecified: Secondary | ICD-10-CM

## 2016-06-19 DIAGNOSIS — I5042 Chronic combined systolic (congestive) and diastolic (congestive) heart failure: Secondary | ICD-10-CM | POA: Diagnosis not present

## 2016-06-19 NOTE — Progress Notes (Signed)
Cardiology Office Note   Date:  06/19/2016   ID:  Julian Ross, DOB 1946/03/24, MRN [id number]  PCP:  Julian Hams, MD  Cardiologist:  Dr Julian Barre, PA-C   Chief Complaint  Patient presents with  . cardioversion    f/u  . Bradycardia    History of Present Illness: Julian Ross is a 70 y.o. male with a history of S-D-CHF (EF 30-35% echo 02/2016), PAF on Eliquis (s/p DCCV 06/05), CAD/PCI (med rx for "tight" RCA), intol statins, DM, HTN, HLD, CKD.  Ever since his cardioversion on 06/05, he has felt that he was maintaining sinus rhythm. For several days, he did well. Then, his heart rate seemed to suddenly drop. He began contacting our office about that and his metoprolol was reduced from 75 mg twice a day to 50 mg twice a day. However, he remained bradycardic and very tired and weak. His metoprolol was changed to carvedilol 12.5 mg twice a day. His first dose of that was last night.  Julian Ross presents for ongoing assessment and management of his bradycardia and weakness.  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 He feels weak and tired. He is concerned because he has had problems with hypertension in the past with a systolic blood pressure size 200. He does not wish to have problems with his blood pressure again. However he admits that he would really appreciated if we could his medications to wear his heart rate was so low. His weight is up on the office readings today, but he states his weight on his home scales has not changed significantly, only going up a pound or 2. He had not weighed himself at home today.  He does not feel that he has any orthopnea or PND. He has not had lower extremity edema. He feels that his dyspnea on exertion is at baseline, but has not been able to exert himself because he is so tired and his heart rate is so low. He does note that when he ambulates his heart rate will increase up to 55, but he has not been  able to get it even up to 60 in the last few days.  He has not had chest finding and is otherwise doing well from a cardiac standpoint.   Past Medical History  Diagnosis Date  . MI (myocardial infarction) ([place])   . Diabetes mellitus type 2 in obese ([place])   . Hypertension   . Septic shock ([place])   . CKD (chronic kidney disease), stage III   . Edema of leg   . Elevated troponin   . Anemia   . Acute hypoxemic respiratory failure ([place])   . Altered mental status   . Atrial fibrillation, new onset ([place])   . AKI (acute kidney injury) ([place])   . Acute respiratory failure with hypoxia ([place])   . Chronic systolic CHF (congestive heart failure) ([place])   . Cardiomyopathy, ischemic   . Demand ischemia ([place])   . Acute renal failure superimposed on stage 3 chronic kidney disease ([place])   . Hypokalemia   . Rheumatoid arthritis of multiple sites with negative rheumatoid factor ([place])   . Acute encephalopathy   . Anxiety state     Past Surgical History  Procedure Laterality Date  . Coronary angioplasty with stent placement      x 10  . Angioplasty    . Cardioversion N/A 06/04/2016    Procedure: CARDIOVERSION;  Surgeon: Julian Latch, MD;  Location: [facility] ENDOSCOPY;  Service: Cardiovascular;  Laterality: N/A;    Current Outpatient Prescriptions  Medication Sig Dispense Refill  . acetaminophen (TYLENOL) 325 MG tablet Take 650 mg by mouth every 4 (four) hours as needed for mild pain or headache.    . ALPRAZolam (NIRAVAM) 0.5 MG dissolvable tablet Take 1 tablet by mouth at bedtime.  2  . apixaban (ELIQUIS) 5 MG TABS tablet Take 1 tablet (5 mg total) by mouth 2 (two) times daily. 60 tablet   . carvedilol (COREG) 12.5 MG tablet Take 1 tablet (12.5 mg total) by mouth 2 (two) times daily. 60 tablet 5  . fenofibrate (TRICOR) 145 MG tablet Take 145 mg by mouth daily.    . folic acid (FOLVITE) 1 MG tablet Take 1 mg by mouth daily.    . furosemide (LASIX) 40 MG tablet Take 20 mg by mouth as directed. 1  tablet by mouth as needed for weight of 2 pounds overnight or 5 in 1 week    . gabapentin (NEURONTIN) 100 MG capsule Take 1 capsule (100 mg total) by mouth daily. 30 capsule 0  . hydrocortisone (ANUSOL-HC) 2.5 % rectal cream Place 1 application rectally 2 (two) times daily as needed for hemorrhoids or itching.    . insulin glargine (LANTUS) 100 UNIT/ML injection Inject 0.05 mLs (5 Units total) into the skin daily. 10 mL 11  . insulin lispro protamine-lispro (HUMALOG 50/50 MIX) (50-50) 100 UNIT/ML SUSP injection Inject 40-50 Units into the skin 3 (three) times daily with meals. Inject 50 units subcutaneously with breakfast and 40 units with lunch & supper    . leucovorin (WELLCOVORIN) 10 MG tablet Take 10 mg by mouth See admin instructions. Take 1 tablet (10 mg) by mouth every 12 hours and 24 hours after the methotrexate dose    . LUMIGAN 0.01 % SOLN Place 1 drop into both eyes daily.  0  . magnesium oxide (MAG-OX) 400 (241.3 Mg) MG tablet Take 1 tablet (400 mg total) by mouth 2 (two) times daily. 60 tablet 0  . Methotrexate Sodium (METHOTREXATE, PF,) 50 MG/2ML injection Inject 15 mg into the muscle once a week. Inject 0.6 ml (15 mg) - on Fridays.  Must be given the same time every administration  0  . Misc Natural Products (OSTEO BI-FLEX ADV DOUBLE ST PO) Take 1 tablet by mouth 2 (two) times daily.    Marland Kitchen omega-3 acid ethyl esters (LOVAZA) 1 g capsule Take 1 capsule by mouth 2 (two) times daily.  11  . PARoxetine (PAXIL) 40 MG tablet Take 1 tablet by mouth daily.  11  . predniSONE (DELTASONE) 5 MG tablet Take 5 mg by mouth daily with breakfast.    . Protein (PROCEL 100) POWD Take 2 scoop by mouth 2 (two) times daily.    . tamsulosin (FLOMAX) 0.4 MG CAPS capsule Take 0.4 mg by mouth daily.   4  . tiZANidine (ZANAFLEX) 4 MG tablet Take 1 tablet by mouth 3 (three) times daily.  2  . topiramate (TOPAMAX) 25 MG tablet Take 25 mg by mouth 2 (two) times daily.    . traMADol (ULTRAM) 50 MG tablet Take 2  tablets by mouth 3 (three) times daily as needed.  2  . VITAMIN D, ERGOCALCIFEROL, PO Take 5,000 Units by mouth at bedtime.    . lidocaine (LIDODERM) 5 % Place 0.5 patches onto the skin daily. Reported on 06/19/2016     Allergies:   Ace inhibitors; Atenolol; Contrast  media; and Penicillins    Social History:  The patient  reports that he has never smoked. He does not have any smokeless tobacco history on file. He reports that he does not drink alcohol or use illicit drugs.   Family History:  The patient's family history includes Heart attack in his brother and father; Heart failure in his mother; Hyperlipidemia in his mother; Hypertension in his mother.    ROS:  Please see the history of present illness. All other systems are reviewed and negative.    PHYSICAL EXAM: VS:  BP 120/62 mmHg  Pulse 48  Ht 5\' 11"  (1.803 m)  Wt 212 lb (96.163 kg)  BMI 29.58 kg/m2 , BMI Body mass index is 29.58 kg/(m^2). GEN: Well nourished, well developed, male in no acute distress HEENT: normal for age  Neck: no JVD, no carotid bruit, no masses Cardiac: RRR; no murmur, no rubs, or gallops Respiratory:  Decreased breath sounds bases bilaterally, normal work of breathing GI: soft, nontender, nondistended, + BS MS: no deformity or atrophy; no edema; distal pulses are 2+ in all 4 extremities  Skin: warm and dry, no rash Neuro:  Strength and sensation are intact Psych: euthymic mood, full affect   EKG:  EKG is ordered today. The ekg ordered today demonstrates sinus bradycardia, heart rate 48 Other than the change from atrial fibrillation to sinus bradycardia, no significant change from 05/22/2016   Recent Labs: 03/18/2016: TSH 0.501 03/19/2016: B Natriuretic Peptide 385.9* 04/01/2016: ALT 42 04/02/2016: Magnesium 2.3 05/21/2016: Platelets 335 05/22/2016: BUN 31*; Creat 1.86* 06/04/2016: Hemoglobin 10.9*; Potassium 3.8; Sodium 141    Lipid Panel    Component Value Date/Time   CHOL 123 03/22/2016 0320    TRIG 154* 03/22/2016 0320   HDL 25* 03/22/2016 0320   CHOLHDL 4.9 03/22/2016 0320   VLDL 31 03/22/2016 0320   LDLCALC 67 03/22/2016 0320     Wt Readings from Last 3 Encounters:  06/19/16 212 lb (96.163 kg)  06/04/16 200 lb (90.719 kg)  05/25/16 193 lb 6.4 oz (87.726 kg)     Other studies Reviewed: Additional studies/ records that were reviewed today include: Office notes and hospital records, testing.  ASSESSMENT AND PLAN:  1.  PAF: I explained that we would like to keep his beta blocker at as high a dose as his heart rate tolerated. Advised that although it could take a day or 2 for his last dose of metoprolol to wear off, felt like his heart rate was too slow. We'll therefore encourage him to decrease the carvedilol to 6.25 mg twice a day. Do not wish to stop it completely to avoid rebound tachycardia, possibly recurrent A. fib. He is to let us know if he has any more palpitations or arrhythmia. As his heart rate came up when he exerted himself, I do not have any evidence of chronotropic incompetence but we will have to watch carefully for this. His baseline heart rate improves and he is still extremely fatigued, he may need a POET to look for chronotropic incompetence.  2. Hypertension: He would benefit from being on hydralazine and nitrates. He stated he had one point had taken hydralazine 25 mg total of those tablets at home. His blood pressures currently well controlled and I don't want to drop it too low. I will have him take half hydralazine 25 mg 3 times a day.  On previous records, he was prescribed Imdur 30 mg daily. If he tolerates the hydralazine well, can start the Imdur.  3. Anticoagulation: He is compliant with the Eliquis and not having any acute bleeding issues  4. CK D stage III: His last creatinine was 1.86. This is sick never currently above his baseline which is between 1.0 1.2. However, it is decreased from the 2.26 he had 05/22. Recheck when he sees Dr [name], his  baseline may have increased  5. Chronic combined systolic and diastolic CHF: The patient states his weight has not increased on his home scales and he feels his respiratory status is at baseline. He is encouraged to continue compliance with medications and a low sodium diabetic diet. He is also encouraged to weigh himself daily.  He had a cough with ace inhibitors and he is not currently on an ARB. Once his renal function stabilizes, consideration can be given to [facility].  Current medicines are reviewed at length with the patient today.  The patient does not have concerns regarding medicines.  The following changes have been made:  Decrease the Coreg, add hydralazine  Labs/ tests ordered today include:   Orders Placed This Encounter  Procedures  . Basic Metabolic Panel (BMET)  . EKG 12-Lead     Disposition:   FU with Dr. [name]  Signed, Tamario Heal, [address]  06/19/2016 12:51 PM    [place] Phone: [phone]; Fax: [phone]  This note was written with the assistance of speech recognition software. Please excuse any transcriptional errors.

## 2016-06-19 NOTE — Patient Instructions (Signed)
Medication Instructions:  Take Hydralazine 25 mg, 1/2 tablets 3 times a day and DECREASE Carvedilol 1/2 tablets twice a day  Labwork: BMP  Testing/Procedures: NONE  Follow-Up: Keep appointment with Dr [name]  Any Other Special Instructions Will Be Listed Below (If Applicable).   If you need a refill on your cardiac medications before your next appointment, please call your pharmacy.

## 2016-07-19 ENCOUNTER — Inpatient Hospital Stay (HOSPITAL_COMMUNITY)
Admission: EM | Admit: 2016-07-19 | Discharge: 2016-07-23 | DRG: 312 | Disposition: A | Discharge status: Home Health | Payer: Medicare Other | Attending: Internal Medicine | Admitting: Internal Medicine

## 2016-07-19 ENCOUNTER — Emergency Department (HOSPITAL_COMMUNITY): Payer: Medicare Other

## 2016-07-19 ENCOUNTER — Encounter (HOSPITAL_COMMUNITY): Payer: Self-pay

## 2016-07-19 DIAGNOSIS — Z7952 Long term (current) use of systemic steroids: Secondary | ICD-10-CM

## 2016-07-19 DIAGNOSIS — N189 Chronic kidney disease, unspecified: Secondary | ICD-10-CM

## 2016-07-19 DIAGNOSIS — Z79899 Other long term (current) drug therapy: Secondary | ICD-10-CM

## 2016-07-19 DIAGNOSIS — S299XXA Unspecified injury of thorax, initial encounter: Secondary | ICD-10-CM | POA: Diagnosis not present

## 2016-07-19 DIAGNOSIS — I130 Hypertensive heart and chronic kidney disease with heart failure and stage 1 through stage 4 chronic kidney disease, or unspecified chronic kidney disease: Secondary | ICD-10-CM | POA: Diagnosis present

## 2016-07-19 DIAGNOSIS — S92511A Displaced fracture of proximal phalanx of right lesser toe(s), initial encounter for closed fracture: Secondary | ICD-10-CM | POA: Diagnosis not present

## 2016-07-19 DIAGNOSIS — E1165 Type 2 diabetes mellitus with hyperglycemia: Secondary | ICD-10-CM | POA: Diagnosis not present

## 2016-07-19 DIAGNOSIS — E1121 Type 2 diabetes mellitus with diabetic nephropathy: Secondary | ICD-10-CM | POA: Diagnosis present

## 2016-07-19 DIAGNOSIS — N183 Chronic kidney disease, stage 3 unspecified: Secondary | ICD-10-CM | POA: Diagnosis present

## 2016-07-19 DIAGNOSIS — I480 Paroxysmal atrial fibrillation: Secondary | ICD-10-CM | POA: Diagnosis present

## 2016-07-19 DIAGNOSIS — Z9861 Coronary angioplasty status: Secondary | ICD-10-CM

## 2016-07-19 DIAGNOSIS — Z794 Long term (current) use of insulin: Secondary | ICD-10-CM

## 2016-07-19 DIAGNOSIS — M25469 Effusion, unspecified knee: Secondary | ICD-10-CM | POA: Insufficient documentation

## 2016-07-19 DIAGNOSIS — R55 Syncope and collapse: Secondary | ICD-10-CM | POA: Diagnosis present

## 2016-07-19 DIAGNOSIS — W19XXXA Unspecified fall, initial encounter: Secondary | ICD-10-CM | POA: Diagnosis present

## 2016-07-19 DIAGNOSIS — I951 Orthostatic hypotension: Principal | ICD-10-CM | POA: Diagnosis present

## 2016-07-19 DIAGNOSIS — I252 Old myocardial infarction: Secondary | ICD-10-CM

## 2016-07-19 DIAGNOSIS — I2510 Atherosclerotic heart disease of native coronary artery without angina pectoris: Secondary | ICD-10-CM | POA: Diagnosis present

## 2016-07-19 DIAGNOSIS — D649 Anemia, unspecified: Secondary | ICD-10-CM | POA: Diagnosis present

## 2016-07-19 DIAGNOSIS — D72829 Elevated white blood cell count, unspecified: Secondary | ICD-10-CM | POA: Diagnosis present

## 2016-07-19 DIAGNOSIS — R52 Pain, unspecified: Secondary | ICD-10-CM

## 2016-07-19 DIAGNOSIS — M069 Rheumatoid arthritis, unspecified: Secondary | ICD-10-CM | POA: Diagnosis present

## 2016-07-19 DIAGNOSIS — D631 Anemia in chronic kidney disease: Secondary | ICD-10-CM | POA: Diagnosis not present

## 2016-07-19 DIAGNOSIS — I7389 Other specified peripheral vascular diseases: Secondary | ICD-10-CM | POA: Diagnosis present

## 2016-07-19 DIAGNOSIS — G4733 Obstructive sleep apnea (adult) (pediatric): Secondary | ICD-10-CM | POA: Diagnosis present

## 2016-07-19 DIAGNOSIS — F411 Generalized anxiety disorder: Secondary | ICD-10-CM | POA: Diagnosis present

## 2016-07-19 DIAGNOSIS — T380X5A Adverse effect of glucocorticoids and synthetic analogues, initial encounter: Secondary | ICD-10-CM | POA: Diagnosis present

## 2016-07-19 DIAGNOSIS — Z955 Presence of coronary angioplasty implant and graft: Secondary | ICD-10-CM

## 2016-07-19 DIAGNOSIS — Z7901 Long term (current) use of anticoagulants: Secondary | ICD-10-CM

## 2016-07-19 DIAGNOSIS — S0990XA Unspecified injury of head, initial encounter: Secondary | ICD-10-CM | POA: Diagnosis not present

## 2016-07-19 DIAGNOSIS — R51 Headache: Secondary | ICD-10-CM | POA: Diagnosis not present

## 2016-07-19 DIAGNOSIS — S92513A Displaced fracture of proximal phalanx of unspecified lesser toe(s), initial encounter for closed fracture: Secondary | ICD-10-CM | POA: Diagnosis present

## 2016-07-19 DIAGNOSIS — E0865 Diabetes mellitus due to underlying condition with hyperglycemia: Secondary | ICD-10-CM

## 2016-07-19 DIAGNOSIS — I255 Ischemic cardiomyopathy: Secondary | ICD-10-CM | POA: Diagnosis present

## 2016-07-19 DIAGNOSIS — E7800 Pure hypercholesterolemia, unspecified: Secondary | ICD-10-CM | POA: Diagnosis present

## 2016-07-19 DIAGNOSIS — E0821 Diabetes mellitus due to underlying condition with diabetic nephropathy: Secondary | ICD-10-CM

## 2016-07-19 DIAGNOSIS — M0609 Rheumatoid arthritis without rheumatoid factor, multiple sites: Secondary | ICD-10-CM

## 2016-07-19 DIAGNOSIS — M1711 Unilateral primary osteoarthritis, right knee: Secondary | ICD-10-CM | POA: Diagnosis present

## 2016-07-19 DIAGNOSIS — R404 Transient alteration of awareness: Secondary | ICD-10-CM | POA: Diagnosis not present

## 2016-07-19 DIAGNOSIS — E1122 Type 2 diabetes mellitus with diabetic chronic kidney disease: Secondary | ICD-10-CM | POA: Diagnosis present

## 2016-07-19 DIAGNOSIS — I10 Essential (primary) hypertension: Secondary | ICD-10-CM

## 2016-07-19 DIAGNOSIS — S0181XA Laceration without foreign body of other part of head, initial encounter: Secondary | ICD-10-CM | POA: Diagnosis present

## 2016-07-19 DIAGNOSIS — I5042 Chronic combined systolic (congestive) and diastolic (congestive) heart failure: Secondary | ICD-10-CM | POA: Diagnosis present

## 2016-07-19 DIAGNOSIS — E1169 Type 2 diabetes mellitus with other specified complication: Secondary | ICD-10-CM

## 2016-07-19 DIAGNOSIS — E785 Hyperlipidemia, unspecified: Secondary | ICD-10-CM

## 2016-07-19 DIAGNOSIS — M25461 Effusion, right knee: Secondary | ICD-10-CM

## 2016-07-19 HISTORY — DX: Low back pain, unspecified: M54.50

## 2016-07-19 HISTORY — DX: Major depressive disorder, single episode, unspecified: F32.9

## 2016-07-19 HISTORY — DX: Dependence on other enabling machines and devices: Z99.89

## 2016-07-19 HISTORY — DX: Hepatitis a without hepatic coma: B15.9

## 2016-07-19 HISTORY — DX: Fibromyalgia: M79.7

## 2016-07-19 HISTORY — DX: Unspecified osteoarthritis, unspecified site: M19.90

## 2016-07-19 HISTORY — DX: Low back pain: M54.5

## 2016-07-19 HISTORY — DX: Depression, unspecified: F32.A

## 2016-07-19 HISTORY — DX: Personal history of other medical treatment: Z92.89

## 2016-07-19 HISTORY — DX: Pure hypercholesterolemia, unspecified: E78.00

## 2016-07-19 HISTORY — DX: Obstructive sleep apnea (adult) (pediatric): G47.33

## 2016-07-19 HISTORY — DX: Other chronic pain: G89.29

## 2016-07-19 LAB — LIPASE, BLOOD: Lipase: 26 U/L (ref 11–51)

## 2016-07-19 LAB — TSH: TSH: 1.209 u[IU]/mL (ref 0.350–4.500)

## 2016-07-19 LAB — BASIC METABOLIC PANEL
ANION GAP: 7 (ref 5–15)
BUN: 21 mg/dL — ABNORMAL HIGH (ref 6–20)
CO2: 24 mmol/L (ref 22–32)
Calcium: 9 mg/dL (ref 8.9–10.3)
Chloride: 103 mmol/L (ref 101–111)
Creatinine, Ser: 1.65 mg/dL — ABNORMAL HIGH (ref 0.61–1.24)
GFR, EST AFRICAN AMERICAN: 47 mL/min — AB (ref >60)
GFR, EST NON AFRICAN AMERICAN: 41 mL/min — AB (ref >60)
Glucose, Bld: 295 mg/dL — ABNORMAL HIGH (ref 65–99)
POTASSIUM: 3.8 mmol/L (ref 3.5–5.1)
SODIUM: 134 mmol/L — AB (ref 135–145)

## 2016-07-19 LAB — HEPATIC FUNCTION PANEL
ALT: 14 U/L — ABNORMAL LOW (ref 17–63)
AST: 25 U/L (ref 15–41)
Albumin: 3.2 g/dL — ABNORMAL LOW (ref 3.5–5.0)
Alkaline Phosphatase: 44 U/L (ref 38–126)
BILIRUBIN DIRECT: 0.1 mg/dL (ref 0.1–0.5)
BILIRUBIN INDIRECT: 0.5 mg/dL (ref 0.3–0.9)
Total Bilirubin: 0.6 mg/dL (ref 0.3–1.2)
Total Protein: 6.2 g/dL — ABNORMAL LOW (ref 6.5–8.1)

## 2016-07-19 LAB — CBC WITH DIFFERENTIAL/PLATELET
Basophils Absolute: 0.1 10*3/uL (ref 0.0–0.1)
Basophils Relative: 0 %
Eosinophils Absolute: 0.1 10*3/uL (ref 0.0–0.7)
Eosinophils Relative: 1 %
HEMATOCRIT: 39.9 % (ref 39.0–52.0)
HEMOGLOBIN: 13.1 g/dL (ref 13.0–17.0)
LYMPHS ABS: 1.3 10*3/uL (ref 0.7–4.0)
LYMPHS PCT: 11 %
MCH: 31 pg (ref 26.0–34.0)
MCHC: 32.8 g/dL (ref 30.0–36.0)
MCV: 94.5 fL (ref 78.0–100.0)
MONOS PCT: 4 %
Monocytes Absolute: 0.4 10*3/uL (ref 0.1–1.0)
NEUTROS PCT: 84 %
Neutro Abs: 10 10*3/uL — ABNORMAL HIGH (ref 1.7–7.7)
Platelets: 282 10*3/uL (ref 150–400)
RBC: 4.22 MIL/uL (ref 4.22–5.81)
RDW: 14.5 % (ref 11.5–15.5)
WBC: 11.8 10*3/uL — AB (ref 4.0–10.5)

## 2016-07-19 LAB — COMPREHENSIVE METABOLIC PANEL
ALT: 15 U/L — ABNORMAL LOW (ref 17–63)
AST: 23 U/L (ref 15–41)
Albumin: 3.5 g/dL (ref 3.5–5.0)
Alkaline Phosphatase: 45 U/L (ref 38–126)
Anion gap: 9 (ref 5–15)
BUN: 19 mg/dL (ref 6–20)
CO2: 29 mmol/L (ref 22–32)
Calcium: 9.6 mg/dL (ref 8.9–10.3)
Chloride: 100 mmol/L — ABNORMAL LOW (ref 101–111)
Creatinine, Ser: 1.51 mg/dL — ABNORMAL HIGH (ref 0.61–1.24)
GFR calc Af Amer: 53 mL/min — ABNORMAL LOW (ref >60)
GFR calc non Af Amer: 45 mL/min — ABNORMAL LOW (ref >60)
Glucose, Bld: 214 mg/dL — ABNORMAL HIGH (ref 65–99)
Potassium: 3.4 mmol/L — ABNORMAL LOW (ref 3.5–5.1)
Sodium: 138 mmol/L (ref 135–145)
Total Bilirubin: 0.6 mg/dL (ref 0.3–1.2)
Total Protein: 7.3 g/dL (ref 6.5–8.1)

## 2016-07-19 LAB — CBC
HEMATOCRIT: 35.8 % — AB (ref 39.0–52.0)
HEMOGLOBIN: 11.9 g/dL — AB (ref 13.0–17.0)
MCH: 31 pg (ref 26.0–34.0)
MCHC: 33.2 g/dL (ref 30.0–36.0)
MCV: 93.2 fL (ref 78.0–100.0)
Platelets: 236 10*3/uL (ref 150–400)
RBC: 3.84 MIL/uL — AB (ref 4.22–5.81)
RDW: 14.6 % (ref 11.5–15.5)
WBC: 13.8 10*3/uL — AB (ref 4.0–10.5)

## 2016-07-19 LAB — TROPONIN I: Troponin I: <0.03 ng/mL (ref <0.03)

## 2016-07-19 LAB — MRSA PCR SCREENING: MRSA BY PCR: POSITIVE — AB

## 2016-07-19 LAB — GLUCOSE, CAPILLARY
GLUCOSE-CAPILLARY: 195 mg/dL — AB (ref 65–99)
GLUCOSE-CAPILLARY: 230 mg/dL — AB (ref 65–99)

## 2016-07-19 LAB — PHOSPHORUS: Phosphorus: 3.9 mg/dL (ref 2.5–4.6)

## 2016-07-19 LAB — PROTIME-INR
INR: 1.29 (ref 0.00–1.49)
Prothrombin Time: 16.2 seconds — ABNORMAL HIGH (ref 11.6–15.2)

## 2016-07-19 LAB — MAGNESIUM: Magnesium: 2.1 mg/dL (ref 1.7–2.4)

## 2016-07-19 LAB — I-STAT TROPONIN, ED: TROPONIN I, POC: 0.04 ng/mL (ref 0.00–0.08)

## 2016-07-19 LAB — APTT: APTT: 35 s (ref 24–37)

## 2016-07-19 MED ORDER — ALPRAZOLAM 0.5 MG PO TBDP: 0.5000 mg | ORAL_TABLET | Freq: Every day | ORAL | Status: DC

## 2016-07-19 MED ORDER — PREDNISONE 5 MG PO TABS
5.0000 mg | ORAL_TABLET | Freq: Every day | ORAL | Status: DC
  Administered 2016-07-20 – 2016-07-23 (×4): 5 mg via ORAL
  Filled 2016-07-19 (×4): qty 1

## 2016-07-19 MED ORDER — SODIUM CHLORIDE 0.9% FLUSH
3.0000 mL | Freq: Two times a day (BID) | INTRAVENOUS | Status: DC
  Administered 2016-07-19 – 2016-07-22 (×7): 3 mL via INTRAVENOUS

## 2016-07-19 MED ORDER — ACETAMINOPHEN 650 MG RE SUPP: 650.0000 mg | Freq: Four times a day (QID) | RECTAL | Status: DC | PRN

## 2016-07-19 MED ORDER — LEUCOVORIN CALCIUM 10 MG PO TABS: 10.0000 mg | ORAL_TABLET | ORAL | Status: DC

## 2016-07-19 MED ORDER — INSULIN ASPART PROT & ASPART (70-30 MIX) 100 UNIT/ML ~~LOC~~ SUSP
40.0000 [IU] | Freq: Two times a day (BID) | SUBCUTANEOUS | Status: DC
  Administered 2016-07-21 – 2016-07-23 (×5): 40 [IU] via SUBCUTANEOUS
  Filled 2016-07-19: qty 10

## 2016-07-19 MED ORDER — MUPIROCIN 2 % EX OINT
1.0000 | TOPICAL_OINTMENT | Freq: Two times a day (BID) | CUTANEOUS | Status: DC
Start: 2016-07-19 — End: 2016-07-23
  Administered 2016-07-19 – 2016-07-23 (×8): 1 via NASAL
  Filled 2016-07-19: qty 22

## 2016-07-19 MED ORDER — VITAMIN D 1000 UNITS PO TABS
5000.0000 [IU] | ORAL_TABLET | Freq: Every day | ORAL | Status: DC
  Administered 2016-07-19 – 2016-07-22 (×4): 5000 [IU] via ORAL
  Filled 2016-07-19 (×4): qty 5

## 2016-07-19 MED ORDER — OMEGA-3-ACID ETHYL ESTERS 1 G PO CAPS
1.0000 | ORAL_CAPSULE | Freq: Two times a day (BID) | ORAL | Status: DC
  Administered 2016-07-19 – 2016-07-23 (×8): 1 g via ORAL
  Filled 2016-07-19 (×8): qty 1

## 2016-07-19 MED ORDER — TRAMADOL HCL 50 MG PO TABS
100.0000 mg | ORAL_TABLET | Freq: Four times a day (QID) | ORAL | Status: DC | PRN
  Administered 2016-07-20 – 2016-07-23 (×7): 100 mg via ORAL
  Filled 2016-07-19 (×7): qty 2

## 2016-07-19 MED ORDER — METHOTREXATE SODIUM CHEMO INJECTION (PF) 50 MG/2ML: 15.0000 mg | INTRAMUSCULAR | Status: DC

## 2016-07-19 MED ORDER — PAROXETINE HCL 20 MG PO TABS
40.0000 mg | ORAL_TABLET | Freq: Every day | ORAL | Status: DC
Start: 2016-07-19 — End: 2016-07-23
  Administered 2016-07-20 – 2016-07-23 (×4): 40 mg via ORAL
  Filled 2016-07-19 (×4): qty 2

## 2016-07-19 MED ORDER — HYDROCORTISONE 2.5 % RE CREA
1.0000 "application " | TOPICAL_CREAM | Freq: Two times a day (BID) | RECTAL | Status: DC | PRN
  Filled 2016-07-19: qty 28.35

## 2016-07-19 MED ORDER — ACETAMINOPHEN 325 MG PO TABS
650.0000 mg | ORAL_TABLET | Freq: Four times a day (QID) | ORAL | Status: DC | PRN
  Administered 2016-07-20 – 2016-07-21 (×2): 650 mg via ORAL
  Filled 2016-07-19 (×2): qty 2

## 2016-07-19 MED ORDER — CARVEDILOL 6.25 MG PO TABS
6.2500 mg | ORAL_TABLET | Freq: Two times a day (BID) | ORAL | Status: DC
  Administered 2016-07-19 – 2016-07-22 (×6): 6.25 mg via ORAL
  Filled 2016-07-19 (×6): qty 1

## 2016-07-19 MED ORDER — ONDANSETRON HCL 4 MG PO TABS: 4.0000 mg | ORAL_TABLET | Freq: Four times a day (QID) | ORAL | Status: DC | PRN

## 2016-07-19 MED ORDER — FUROSEMIDE 20 MG PO TABS
20.0000 mg | ORAL_TABLET | Freq: Every day | ORAL | Status: DC
  Administered 2016-07-20 – 2016-07-21 (×2): 20 mg via ORAL
  Filled 2016-07-19 (×2): qty 1

## 2016-07-19 MED ORDER — INSULIN GLARGINE 100 UNIT/ML ~~LOC~~ SOLN
5.0000 [IU] | Freq: Every day | SUBCUTANEOUS | Status: DC
  Administered 2016-07-19 – 2016-07-23 (×5): 5 [IU] via SUBCUTANEOUS
  Filled 2016-07-19 (×5): qty 0.05

## 2016-07-19 MED ORDER — MAGNESIUM OXIDE 400 (241.3 MG) MG PO TABS
400.0000 mg | ORAL_TABLET | Freq: Two times a day (BID) | ORAL | Status: DC
  Administered 2016-07-19 – 2016-07-23 (×8): 400 mg via ORAL
  Filled 2016-07-19 (×8): qty 1

## 2016-07-19 MED ORDER — ONDANSETRON HCL 4 MG/2ML IJ SOLN: 4.0000 mg | Freq: Four times a day (QID) | INTRAMUSCULAR | Status: DC | PRN

## 2016-07-19 MED ORDER — APIXABAN 5 MG PO TABS
5.0000 mg | ORAL_TABLET | Freq: Two times a day (BID) | ORAL | Status: DC
  Administered 2016-07-19 – 2016-07-23 (×8): 5 mg via ORAL
  Filled 2016-07-19 (×8): qty 1

## 2016-07-19 MED ORDER — CHLORHEXIDINE GLUCONATE CLOTH 2 % EX PADS
6.0000 | MEDICATED_PAD | Freq: Every day | CUTANEOUS | Status: DC
  Administered 2016-07-20 – 2016-07-23 (×4): 6 via TOPICAL

## 2016-07-19 MED ORDER — ACETAMINOPHEN 325 MG PO TABS: 650.0000 mg | ORAL_TABLET | ORAL | Status: DC | PRN

## 2016-07-19 MED ORDER — ALPRAZOLAM 0.5 MG PO TABS: 0.5000 mg | ORAL_TABLET | Freq: Every day | ORAL | Status: DC

## 2016-07-19 MED ORDER — LATANOPROST 0.005 % OP SOLN
1.0000 [drp] | Freq: Every day | OPHTHALMIC | Status: DC
  Administered 2016-07-19 – 2016-07-22 (×4): 1 [drp] via OPHTHALMIC
  Filled 2016-07-19: qty 2.5

## 2016-07-19 MED ORDER — TOPIRAMATE 25 MG PO TABS
25.0000 mg | ORAL_TABLET | Freq: Two times a day (BID) | ORAL | Status: DC
  Administered 2016-07-19 – 2016-07-23 (×8): 25 mg via ORAL
  Filled 2016-07-19 (×8): qty 1

## 2016-07-19 MED ORDER — TIZANIDINE HCL 4 MG PO TABS
4.0000 mg | ORAL_TABLET | Freq: Three times a day (TID) | ORAL | Status: DC
  Administered 2016-07-19 – 2016-07-23 (×11): 4 mg via ORAL
  Filled 2016-07-19 (×13): qty 1

## 2016-07-19 MED ORDER — GABAPENTIN 100 MG PO CAPS
100.0000 mg | ORAL_CAPSULE | Freq: Every day | ORAL | Status: DC
  Administered 2016-07-20 – 2016-07-23 (×4): 100 mg via ORAL
  Filled 2016-07-19 (×4): qty 1

## 2016-07-19 MED ORDER — HYDRALAZINE HCL 25 MG PO TABS
12.5000 mg | ORAL_TABLET | Freq: Three times a day (TID) | ORAL | Status: DC
  Administered 2016-07-19 – 2016-07-21 (×7): 12.5 mg via ORAL
  Filled 2016-07-19 (×9): qty 1

## 2016-07-19 MED ORDER — FENOFIBRATE 160 MG PO TABS
160.0000 mg | ORAL_TABLET | Freq: Every day | ORAL | Status: DC
  Administered 2016-07-20 – 2016-07-23 (×4): 160 mg via ORAL
  Filled 2016-07-19 (×4): qty 1

## 2016-07-19 MED ORDER — TAMSULOSIN HCL 0.4 MG PO CAPS
0.4000 mg | ORAL_CAPSULE | Freq: Every day | ORAL | Status: DC
  Administered 2016-07-20 – 2016-07-23 (×4): 0.4 mg via ORAL
  Filled 2016-07-19 (×4): qty 1

## 2016-07-19 MED ORDER — FOLIC ACID 1 MG PO TABS
1.0000 mg | ORAL_TABLET | Freq: Every day | ORAL | Status: DC
  Administered 2016-07-20 – 2016-07-23 (×4): 1 mg via ORAL
  Filled 2016-07-19 (×4): qty 1

## 2016-07-19 NOTE — Progress Notes (Signed)
MEDICATION RELATED CONSULT NOTE - Insulin regimen  Pt was concerned about being on Lantus + Novolog 70/30. He said he was only started on Lantus after being admitted to the hospital recently, it was erroneously prescribed for him in addition to his 50/50. He was never educated about the use of Lantus, from the way he described it to me, it sounds like Julian Ross has been using his Lantus prn, as a sliding scale in addition to his normal 50/50 regimen.   His PTA insulin regimen is as follows:  Humalog 50/50  40 units qAM, 40 units q lunch, 30 units q dinner  At discharge, he would like to only have Humalog 50/50 prescribed, as he has multiple pens at home he needs to use up.   Recommendations: -Discontinue Lantus while inpatient -Continue Novolog 70/30, add sliding scale if needed -At discharge, resume patient's Humalog 50/50 regimen    Hughes Better, PharmD, BCPS Clinical Pharmacist 07/19/2016 10:27 PM

## 2016-07-19 NOTE — H&P (Addendum)
History and Physical    Julian Ross [id number] DOB: 10-29-1946 DOA: 07/19/2016  Referring MD/NP/PA: Nanda Quinton, PA  PCP: Kandice Hams, MD   Outpatient Specialists: Cardiology: Dr. Jonn Shingles   Patient coming from: home   Chief Complaint: loss of consciousness   HPI: Julian Ross is a 70 y.o. male with medical history significant for chronic combined CHF with last 2 D ECHO in 02/2016 with EF 30%, hypertension, DM on insulin, dyslipidemia, RA on prednisone, atrial fibrillation on apixaban who presented to [facility] with reprots of loss of consciousness on the day of the admission. He was getting ready to go out to a restaurant with his family and everything was fine. He felt fine throughout the lunch time with no chest pain or shortness of breath until the moment he got up ready to leave and then he lost consciousness for about few seconds. He dose report having occasional dizziness and usually this is his prodromal sign and he sits down. This time he did not have prodromal symptoms and just collapsed. No chest pain, no shortness of breath. No palpitations. No reports of seizures. No fevers. No abdominal pain, nausea or vomiting. No urinary complaints. No blood in stool or urine.   ED Course: In ED, pt was hemodynamically stable. Blood work was notable for WBC count 13.8, hemoglobin 11.9, Cr 1.65 (recent baseline 2.13). CT head showed no acute intracranial findings. CXR showed mild interstitial prominence bilaterally probably chronic in nature. Right foot x ray showed displaced fracture of proximal phalanx, mild, middle toe. Erosive arthropathy and partial subluxation of first MTP joint consistent with RA.  Review of Systems:  Constitutional: Negative for fever, chills, diaphoresis, activity change, appetite change and fatigue.  HENT: Negative for ear pain, nosebleeds, congestion, facial swelling, rhinorrhea, neck pain, neck stiffness and ear discharge.   Eyes: Negative for pain,  discharge, redness, itching and visual disturbance.  Respiratory: Negative for cough, choking, chest tightness, shortness of breath, wheezing and stridor.   Cardiovascular: Negative for chest pain, palpitations and leg swelling.  Gastrointestinal: Negative for abdominal distention.  Genitourinary: Negative for dysuria, urgency, frequency, hematuria, flank pain, decreased urine volume, difficulty urinating and dyspareunia.  Musculoskeletal: Negative for back pain, joint swelling, arthralgias and gait problem.  Neurological: per HPI  Hematological: Negative for adenopathy. Does not bruise/bleed easily.  Psychiatric/Behavioral: Negative for hallucinations, behavioral problems, confusion, dysphoric mood, decreased concentration and agitation.   Past Medical History  Diagnosis Date  . MI (myocardial infarction) ([place])   . Diabetes mellitus type 2 in obese ([place])   . Hypertension   . Septic shock ([place])   . CKD (chronic kidney disease), stage III   . Edema of leg   . Elevated troponin   . Anemia   . Acute hypoxemic respiratory failure ([place])   . Altered mental status   . Atrial fibrillation, new onset ([place])   . AKI (acute kidney injury) ([place])   . Acute respiratory failure with hypoxia ([place])   . Chronic systolic CHF (congestive heart failure) ([place])   . Cardiomyopathy, ischemic   . Demand ischemia ([place])   . Acute renal failure superimposed on stage 3 chronic kidney disease ([place])   . Hypokalemia   . Rheumatoid arthritis of multiple sites with negative rheumatoid factor ([place])   . Acute encephalopathy   . Anxiety state     Past Surgical History  Procedure Laterality Date  . Coronary angioplasty with stent placement      x 10  . Angioplasty    .  Cardioversion N/A 06/04/2016    Procedure: CARDIOVERSION;  Surgeon: Skeet Latch, MD;  Location: [place];  Service: Cardiovascular;  Laterality: N/A;    Social history:  reports that he has never smoked. He does not have any smokeless  tobacco history on file. He reports that he does not drink alcohol or use illicit drugs.  Ambulation  Walks independently   Allergies  Allergen Reactions  . Ace Inhibitors Cough  . Atenolol Other (See Comments)    Unknown reaction  . Contrast Media [Iodinated Diagnostic Agents] Rash  . Penicillins Hives and Rash    Has patient had a PCN reaction causing immediate rash, facial/tongue/throat swelling, SOB or lightheadedness with hypotension: Yes Has patient had a PCN reaction causing severe rash involving mucus membranes or skin necrosis: No Has patient had a PCN reaction that required hospitalization pt was in the hospital at time of last reaction - heart attack Has patient had a PCN reaction occurring within the last 10 years: No If all of the above answers are "NO", then may proceed with Cephalosporin use.    Family History  Problem Relation Age of Onset  . Heart failure Mother   . Hyperlipidemia Mother   . Hypertension Mother   . Heart attack Father   . Heart attack Brother     Prior to Admission medications   Medication Sig Start Date End Date Taking? Authorizing Provider  acetaminophen (TYLENOL) 325 MG tablet Take 650 mg by mouth every 4 (four) hours as needed for mild pain or headache.    Historical Provider, MD  ALPRAZolam (NIRAVAM) 0.5 MG dissolvable tablet Take 1 tablet by mouth at bedtime. 05/30/16   Historical Provider, MD  apixaban (ELIQUIS) 5 MG TABS tablet Take 1 tablet (5 mg total) by mouth 2 (two) times daily. 04/01/16   Nita Sells, MD  carvedilol (COREG) 6.25 MG tablet Take 6.25 mg by mouth 2 (two) times daily with a meal.    Historical Provider, MD  fenofibrate (TRICOR) 145 MG tablet Take 145 mg by mouth daily.    Historical Provider, MD  folic acid (FOLVITE) 1 MG tablet Take 1 mg by mouth daily.    Historical Provider, MD  furosemide (LASIX) 40 MG tablet Take 20 mg by mouth as directed. 1 tablet by mouth as needed for weight of 2 pounds overnight or 5 in 1  week    Historical Provider, MD  gabapentin (NEURONTIN) 100 MG capsule Take 1 capsule (100 mg total) by mouth daily. 04/02/16   Nita Sells, MD  hydrALAZINE (APRESOLINE) 25 MG tablet Take 12.5 mg by mouth 3 (three) times daily.    Historical Provider, MD  hydrocortisone (ANUSOL-HC) 2.5 % rectal cream Place 1 application rectally 2 (two) times daily as needed for hemorrhoids or itching.    Historical Provider, MD  insulin glargine (LANTUS) 100 UNIT/ML injection Inject 0.05 mLs (5 Units total) into the skin daily. 04/02/16   Nita Sells, MD  insulin lispro protamine-lispro (HUMALOG 50/50 MIX) (50-50) 100 UNIT/ML SUSP injection Inject 40-50 Units into the skin 3 (three) times daily with meals. Inject 50 units subcutaneously with breakfast and 40 units with lunch & supper    Historical Provider, MD  leucovorin (WELLCOVORIN) 10 MG tablet Take 10 mg by mouth See admin instructions. Take 1 tablet (10 mg) by mouth every 12 hours and 24 hours after the methotrexate dose    Historical Provider, MD  lidocaine (LIDODERM) 5 % Place 0.5 patches onto the skin daily. Reported on 06/19/2016  Historical Provider, MD  LUMIGAN 0.01 % SOLN Place 1 drop into both eyes daily. 04/30/16   Historical Provider, MD  magnesium oxide (MAG-OX) 400 (241.3 Mg) MG tablet Take 1 tablet (400 mg total) by mouth 2 (two) times daily. 04/01/16   Nita Sells, MD  Methotrexate Sodium (METHOTREXATE, PF,) 50 MG/2ML injection Inject 15 mg into the muscle once a week. Inject 0.6 ml (15 mg) - on Fridays.  Must be given the same time every administration 03/09/16   Historical Provider, MD  Misc Natural Products (OSTEO BI-FLEX ADV DOUBLE ST PO) Take 1 tablet by mouth 2 (two) times daily.    Historical Provider, MD  omega-3 acid ethyl esters (LOVAZA) 1 g capsule Take 1 capsule by mouth 2 (two) times daily. 05/29/16   Historical Provider, MD  PARoxetine (PAXIL) 40 MG tablet Take 1 tablet by mouth daily. 04/30/16   Historical Provider, MD    predniSONE (DELTASONE) 5 MG tablet Take 5 mg by mouth daily with breakfast.    Historical Provider, MD  Protein (PROCEL 100) POWD Take 2 scoop by mouth 2 (two) times daily.    Historical Provider, MD  tamsulosin (FLOMAX) 0.4 MG CAPS capsule Take 0.4 mg by mouth daily.  02/21/16   Historical Provider, MD  tiZANidine (ZANAFLEX) 4 MG tablet Take 1 tablet by mouth 3 (three) times daily. 05/29/16   Historical Provider, MD  topiramate (TOPAMAX) 25 MG tablet Take 25 mg by mouth 2 (two) times daily.    Historical Provider, MD  traMADol (ULTRAM) 50 MG tablet Take 2 tablets by mouth 3 (three) times daily as needed. 06/15/16   Historical Provider, MD  VITAMIN D, ERGOCALCIFEROL, PO Take 5,000 Units by mouth at bedtime.    Historical Provider, MD    Physical Exam: Filed Vitals:   07/19/16 1342 07/19/16 1400 07/19/16 1445 07/19/16 1500  BP: 131/81 136/82 141/87 148/89  Pulse: 63 62 64 65  Temp: 98.3 F (36.8 C)     TempSrc: Oral     Resp: 16 14 19 15   Height: 5\' 11"  (1.803 m)     Weight: 92.987 kg (205 lb)     SpO2: 97% 98% 98% 98%    Constitutional: NAD, calm, comfortable Filed Vitals:   07/19/16 1342 07/19/16 1400 07/19/16 1445 07/19/16 1500  BP: 131/81 136/82 141/87 148/89  Pulse: 63 62 64 65  Temp: 98.3 F (36.8 C)     TempSrc: Oral     Resp: 16 14 19 15   Height: 5\' 11"  (1.803 m)     Weight: 92.987 kg (205 lb)     SpO2: 97% 98% 98% 98%   Eyes: PERRL, lids and conjunctivae normal, small bruise above the nose on the forehead with dry blood  ENMT: Mucous membranes are moist. Posterior pharynx clear of any exudate or lesions.Normal dentition.  Neck: normal, supple, no masses, no thyromegaly Respiratory: clear to auscultation bilaterally, no wheezing, no crackles. Normal respiratory effort. No accessory muscle use.  Cardiovascular: Regular rate and rhythm, appreciate S1, S2  Abdomen: no tenderness, no masses palpated. No hepatosplenomegaly. Bowel sounds positive.  Musculoskeletal: no  clubbing / cyanosis. No joint deformity upper and lower extremities. Good ROM, no contractures. Normal muscle tone.  Skin: no rashes, lesions, ulcers. No induration Neurologic: CN 2-12 grossly intact. Sensation intact, DTR normal. Strength 5/5 in all 4.  Psychiatric: Normal judgment and insight. Alert and oriented x 3. Normal mood.   Labs on Admission: I have personally reviewed following labs and imaging studies  CBC:  Recent Labs Lab 07/19/16 1343  WBC 13.8*  HGB 11.9*  HCT 35.8*  MCV 93.2  PLT [id number]   Basic Metabolic Panel:  Recent Labs Lab 07/19/16 1343  NA 134*  K 3.8  CL 103  CO2 24  GLUCOSE 295*  BUN 21*  CREATININE 1.65*  CALCIUM 9.0   GFR: Estimated Creatinine Clearance: 49.2 mL/min (by C-G formula based on Cr of 1.65). Liver Function Tests:  Recent Labs Lab 07/19/16 1343  AST 25  ALT 14*  ALKPHOS 44  BILITOT 0.6  PROT 6.2*  ALBUMIN 3.2*    Recent Labs Lab 07/19/16 1343  LIPASE 26   No results for input(s): AMMONIA in the last 168 hours. Coagulation Profile: No results for input(s): INR, PROTIME in the last 168 hours. Cardiac Enzymes: No results for input(s): CKTOTAL, CKMB, CKMBINDEX, TROPONINI in the last 168 hours. BNP (last 3 results) No results for input(s): PROBNP in the last 8760 hours. HbA1C: No results for input(s): HGBA1C in the last 72 hours. CBG: No results for input(s): GLUCAP in the last 168 hours. Lipid Profile: No results for input(s): CHOL, HDL, LDLCALC, TRIG, CHOLHDL, LDLDIRECT in the last 72 hours. Thyroid Function Tests: No results for input(s): TSH, T4TOTAL, FREET4, T3FREE, THYROIDAB in the last 72 hours. Anemia Panel: No results for input(s): VITAMINB12, FOLATE, FERRITIN, TIBC, IRON, RETICCTPCT in the last 72 hours. Urine analysis: No results found for: COLORURINE, APPEARANCEUR, LABSPEC, PHURINE, GLUCOSEU, HGBUR, BILIRUBINUR, KETONESUR, PROTEINUR, UROBILINOGEN, NITRITE, LEUKOCYTESUR Sepsis  Labs: @LABRCNTIP (procalcitonin:4,lacticidven:4) )No results found for this or any previous visit (from the past 240 hour(s)).   Radiological Exams on Admission: Dg Chest 2 View  07/19/2016  CLINICAL DATA:  Syncope, fall, diabetes EXAM: CHEST  2 VIEW COMPARISON:  03/25/2016 FINDINGS: Cardiomediastinal silhouette is stable. There is mild interstitial prominence bilaterally left greater than right probable chronic in nature. No convincing pulmonary edema or superimposed infiltrate. Bony thorax is unremarkable. IMPRESSION: Mild interstitial prominence bilateral probable chronic in nature. No convincing pulmonary edema or superimposed infiltrate. Electronically Signed   By: Lahoma Crocker M.D.   On: 07/19/2016 14:39   Ct Head Wo Contrast  07/19/2016  CLINICAL DATA:  Pain following fall EXAM: CT HEAD WITHOUT CONTRAST TECHNIQUE: Contiguous axial images were obtained from the base of the skull through the vertex without intravenous contrast. COMPARISON:  March 23, 2016 FINDINGS: Brain: Moderate diffuse atrophy is stable. There is no intracranial mass, hemorrhage, extra-axial fluid collection, or midline shift. There is mild small vessel disease in the centra semiovale bilaterally. Elsewhere gray-white compartments appear normal. No acute infarct is evident. Vascular: There is calcification in the distal vertebral arteries and cavernous carotid artery regions bilaterally. There is calcification in the carotid siphon regions as well. No hyperdense vessel evident. Skull: The bony calvarium appears intact. Sinuses/Orbits: Orbits appear symmetric bilaterally. Patient appears to have had an antrostomy on the left. There is mucosal thickening throughout the left maxillary antrum. There is opacification in multiple ethmoid air cells on the left. A much lesser degree of opacification is noted in right-sided ethmoid air cells. There is opacification in a portion of the left frontal sinus. Other: Mastoid air cells are clear.  IMPRESSION: Atrophy with mild periventricular small vessel disease. No intracranial mass, hemorrhage, or extra-axial fluid collection. No acute infarct evident. 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 Extensive mucoperiosteal thickening in the left maxillary antrum. There is opacification in multiple ethmoid air cells on the left as well as in the left frontal sinus region. Much  milder paranasal sinus disease is noted in an anterior right ethmoid air cell region. There are foci of arterial vascular calcification. Electronically Signed   By: Lowella Grip III M.D.   On: 07/19/2016 14:31   Dg Foot Complete Right  07/19/2016  CLINICAL DATA:  Fall. Right foot injury and pain. Initial encounter. Rheumatoid arthritis. EXAM: RIGHT FOOT COMPLETE - 3+ VIEW COMPARISON:  None. FINDINGS: Mildly displaced oblique fracture of the proximal phalanx of the middle toe is seen. No evidence of dislocation. Chronic erosive arthropathy is seen involving first MTP joint with partial subluxation of the proximal phalanx. Extensive peripheral vascular calcification noted. IMPRESSION: Mildly displaced fracture of proximal phalanx of the middle toe. Chronic erosive arthropathy and partial subluxation involving first MTP joint. This consistent with history of rheumatoid arthritis. Electronically Signed   By: Earle Gell M.D.   On: 07/19/2016 15:44    EKG: Independently reviewed.   Assessment/Plan  Principal Problem:   Syncope and collapse - Worrisome for cardiac etiology - Will cycle cardiac enzymes  - His last 2 D ECHO was in 02/2016 with EF 30%. He does have evidence of structural heart disease on this 2 D ECHO with global hypokinesis with severe inferior hypokinesis to akinesis but other than dizziness does not report significant limitation with ordinary activities - He does report intermittent dizziness but no syncope - Will follow up on 2 D ECHO in am and call cardio in am - Resume home meds  Active Problems:    Rheumatoid arthritis of multiple sites with negative rheumatoid factor ([place]) - Continue low dose prednisone    CAD (coronary artery disease), native artery, no angina pectoris - Continue apixaban    Paroxysmal atrial fibrillation (HCC) - CHADS vasc score 4 - On AC with apixaban - Rate controlled with carvedilol    Chronic combined systolic and diastolic CHF (congestive heart failure) (H[facility]) - Last 2 D ECHO in 02/2016 with EF 30% and evidence of structural heart disease on this 2 D ECHO with global hypokinesis with severe inferior hypokinesis to akinesis - Follow up 2 D EHCO on this admission    CKD (chronic kidney disease) stage 3, GFR 30-59 ml/min - Baseline CR in 02/2016 was 2.13    Leukocytosis - Likely due to chronic steroids     Anemia of chronic kidney failure - Stable hemoglobin    Uncontrolled diabetes mellitus with diabetic nephropathy, with long-term current use of insulin (HCC) - A1c in 02/2016 was 9.2 indicating poor glycemic control - Resume insulin regimen per home schedule    Benign essential HTN - Resume home meds    Dyslipidemia associated with type 2 diabetes mellitus (HCC) - Resume statin therapy    DVT prophylaxis: on full dose AC with apixaban  Code Status: full code  Family Communication: daughter at the bedside today  Disposition Plan: home in next 24-48 hours  Consults called: None but we will call cardio in am depending on results of ECHO Admission status: inpatient and telemetry    Leisa Lenz MD [facility] Pager [phone]  If 7PM-7AM, please contact night-coverage [email or web address] Password [facility]  07/19/2016, 3:51 PM

## 2016-07-19 NOTE — ED Notes (Signed)
MD at bedside. 

## 2016-07-19 NOTE — ED Notes (Signed)
Patient transported to CT 

## 2016-07-19 NOTE — ED Provider Notes (Signed)
Emergency Department Provider Note  Time seen: Approximately 2:02 PM  I have reviewed the triage vital signs and the nursing notes.   HISTORY  Chief Complaint Near Syncope   HPI Brycen Salsedo is a 70 y.o. male with PMH of CKD, DM, CHF, Anemia presents to the emergency department for evaluation of syncopal episode. The patient reports feeling lightheaded on his way to lunch. While sitting at the table the patient began feeling suddenly very lightheaded. He tried to himself to the ground and had a syncopal event lasted several seconds. He did hit his head on the ground. The patient is currently taking liquids. He denies any preceding chest pain or heart palpitations. He is otherwise been in his normal state of health. Patient reports prior episodes of syncope that were thought to be secondary to blood pressure medication. These medications were discontinued and he been doing well. He is been on his current blood pressure medication regimen for the past 6 weeks without issue. No fever associated infection symptoms. Patient is feeling back to baseline at this time.  Past Medical History  Diagnosis Date  . MI (myocardial infarction) ([place])   . Diabetes mellitus type 2 in obese ([place])   . Hypertension   . Septic shock ([place])   . CKD (chronic kidney disease), stage III   . Edema of leg   . Elevated troponin   . Anemia   . Acute hypoxemic respiratory failure ([place])   . Altered mental status   . Atrial fibrillation, new onset ([place])   . AKI (acute kidney injury) ([place])   . Acute respiratory failure with hypoxia ([place])   . Chronic systolic CHF (congestive heart failure) ([place])   . Cardiomyopathy, ischemic   . Demand ischemia ([place])   . Acute renal failure superimposed on stage 3 chronic kidney disease ([place])   . Hypokalemia   . Rheumatoid arthritis of multiple sites with negative rheumatoid factor ([place])   . Acute encephalopathy   . Anxiety state     Patient Active Problem List   Diagnosis  Date Noted  . Hypertension   . Paroxysmal atrial fibrillation (HCC)   . Hypotension 04/16/2016  . CAD (coronary artery disease) 04/16/2016  . Chronic systolic CHF (congestive heart failure) ([place])   . Cardiomyopathy, ischemic   . New onset atrial fibrillation ([place])   . Demand ischemia ([place])   . Acute renal failure superimposed on stage 3 chronic kidney disease ([place])   . Hypokalemia   . Rheumatoid arthritis of multiple sites with negative rheumatoid factor ([place])   . Acute encephalopathy   . Anxiety state   . Acute respiratory failure with hypoxia ([place])   . Acute respiratory failure with hypoxemia ([place])   . AKI (acute kidney injury) ([place])   . Altered mental status   . Atrial fibrillation, new onset ([place])   . Acute on chronic systolic and diastolic heart failure, NYHA class 1 ([place])   . Acute hypoxemic respiratory failure ([place]) 03/19/2016  . Septic shock ([place]) 03/18/2016  . Severe sepsis ([place]) 03/18/2016  . CKD (chronic kidney disease), stage III 03/18/2016  . Edema leg 03/18/2016  . Diabetes mellitus type 2 in obese ([place]) 03/18/2016  . Elevated troponin 03/18/2016  . Anemia 03/18/2016    Past Surgical History  Procedure Laterality Date  . Coronary angioplasty with stent placement      x 10  . Angioplasty    . Cardioversion N/A 06/04/2016    Procedure: CARDIOVERSION;  Surgeon: Skeet Latch, MD;  Location: [facility] END[facility];  Service: Cardiovascular;  Laterality: N/A;    Current Outpatient Rx  Name  Route  Sig  Dispense  Refill  . acetaminophen (TYLENOL) 325 MG tablet   Oral   Take 650 mg by mouth every 4 (four) hours as needed for mild pain or headache.         . ALPRAZolam (NIRAVAM) 0.5 MG dissolvable tablet   Oral   Take 1 tablet by mouth at bedtime.      2   . apixaban (ELIQUIS) 5 MG TABS tablet   Oral   Take 1 tablet (5 mg total) by mouth 2 (two) times daily.   60 tablet      . carvedilol (COREG) 6.25 MG tablet   Oral   Take 6.25 mg by mouth 2 (two) times  daily with a meal.         . fenofibrate (TRICOR) 145 MG tablet   Oral   Take 145 mg by mouth daily.         . folic acid (FOLVITE) 1 MG tablet   Oral   Take 1 mg by mouth daily.         . furosemide (LASIX) 40 MG tablet   Oral   Take 20 mg by mouth as directed. 1 tablet by mouth as needed for weight of 2 pounds overnight or 5 in 1 week         . gabapentin (NEURONTIN) 100 MG capsule   Oral   Take 1 capsule (100 mg total) by mouth daily.   30 capsule   0   . hydrALAZINE (APRESOLINE) 25 MG tablet   Oral   Take 12.5 mg by mouth 3 (three) times daily.         . hydrocortisone (ANUSOL-HC) 2.5 % rectal cream   Rectal   Place 1 application rectally 2 (two) times daily as needed for hemorrhoids or itching.         . insulin glargine (LANTUS) 100 UNIT/ML injection   Subcutaneous   Inject 0.05 mLs (5 Units total) into the skin daily.   10 mL   11   . insulin lispro protamine-lispro (HUMALOG 50/50 MIX) (50-50) 100 UNIT/ML SUSP injection   Subcutaneous   Inject 40-50 Units into the skin 3 (three) times daily with meals. Inject 50 units subcutaneously with breakfast and 40 units with lunch & supper         . leucovorin (WELLCOVORIN) 10 MG tablet   Oral   Take 10 mg by mouth See admin instructions. Take 1 tablet (10 mg) by mouth every 12 hours and 24 hours after the methotrexate dose         . lidocaine (LIDODERM) 5 %   Transdermal   Place 0.5 patches onto the skin daily. Reported on 06/19/2016         . LUMIGAN 0.01 % SOLN   Both Eyes   Place 1 drop into both eyes daily.      0     Dispense as written.   . magnesium oxide (MAG-OX) 400 (241.3 Mg) MG tablet   Oral   Take 1 tablet (400 mg total) by mouth 2 (two) times daily.   60 tablet   0   . Methotrexate Sodium (METHOTREXATE, PF,) 50 MG/2ML injection   Intramuscular   Inject 15 mg into the muscle once a week. Inject 0.6 ml (15 mg) - on Fridays.  Must be given the same time every administration  0   . Misc Natural Products (OSTEO BI-FLEX ADV DOUBLE ST PO)   Oral   Take 1 tablet by mouth 2 (two) times daily.         Marland Kitchen omega-3 acid ethyl esters (LOVAZA) 1 g capsule   Oral   Take 1 capsule by mouth 2 (two) times daily.      11   . PARoxetine (PAXIL) 40 MG tablet   Oral   Take 1 tablet by mouth daily.      11   . predniSONE (DELTASONE) 5 MG tablet   Oral   Take 5 mg by mouth daily with breakfast.         . Protein (PROCEL 100) POWD   Oral   Take 2 scoop by mouth 2 (two) times daily.         . tamsulosin (FLOMAX) 0.4 MG CAPS capsule   Oral   Take 0.4 mg by mouth daily.       4   . tiZANidine (ZANAFLEX) 4 MG tablet   Oral   Take 1 tablet by mouth 3 (three) times daily.      2   . topiramate (TOPAMAX) 25 MG tablet   Oral   Take 25 mg by mouth 2 (two) times daily.         . traMADol (ULTRAM) 50 MG tablet   Oral   Take 2 tablets by mouth 3 (three) times daily as needed.      2   . VITAMIN D, ERGOCALCIFEROL, PO   Oral   Take 5,000 Units by mouth at bedtime.           Allergies Ace inhibitors; Atenolol; Contrast media; and Penicillins  Family History  Problem Relation Age of Onset  . Heart failure Mother   . Hyperlipidemia Mother   . Hypertension Mother   . Heart attack Father   . Heart attack Brother     Social History Social History  Substance Use Topics  . Smoking status: Never Smoker   . Smokeless tobacco: None  . Alcohol Use: No    Review of Systems  Constitutional: No fever/chills Eyes: No visual changes. ENT: No sore throat. Cardiovascular: Denies chest pain; positive syncope and collapse.  Respiratory: Denies shortness of breath. Gastrointestinal: No abdominal pain.  No nausea, no vomiting.  No diarrhea.  No constipation. Genitourinary: Negative for dysuria. Musculoskeletal: Negative for back pain. Skin: Negative for rash. Neurological: Negative for headaches, focal weakness or numbness.  10-point ROS otherwise  negative.  ____________________________________________   PHYSICAL EXAM:  VITAL SIGNS: ED Triage Vitals  Enc Vitals Group     BP 07/19/16 1336 131/81 mmHg     Pulse Rate 07/19/16 1336 82     Resp 07/19/16 1336 15     Temp 07/19/16 1342 98.3 F (36.8 C)     Temp Source 07/19/16 1342 Oral     SpO2 07/19/16 1336 98 %     Weight 07/19/16 1342 205 lb (92.987 kg)     Height 07/19/16 1342 5\' 11"  (1.803 m)     Pain Score 07/19/16 1343 0   Constitutional: Alert and oriented. Well appearing and in no acute distress. Eyes: Conjunctivae are normal. PERRL. EOMI. Head: Atraumatic.  Nose: No congestion/rhinnorhea. Mouth/Throat: Mucous membranes are moist.  Oropharynx non-erythematous. Neck: No stridor.   Cardiovascular: Normal rate, regular rhythm. Good peripheral circulation. Grossly normal heart sounds.   Respiratory: Normal respiratory effort.  No retractions. Lungs CTAB. Gastrointestinal: Soft and nontender. No  distention.  Musculoskeletal: No lower extremity tenderness nor edema. No gross deformities of extremities. Neurologic:  Normal speech and language. No gross focal neurologic deficits are appreciated.  Skin:  Skin is warm, dry and intact. Small left eyebrow abrasion.  Psychiatric: Mood and affect are normal. Speech and behavior are normal.  ____________________________________________   LABS (all labs ordered are listed, but only abnormal results are displayed)  Labs Reviewed  BASIC METABOLIC PANEL - Abnormal; Notable for the following:    Sodium 134 (*)    Glucose, Bld 295 (*)    BUN 21 (*)    Creatinine, Ser 1.65 (*)    GFR calc non Af Amer 41 (*)    GFR calc Af Amer 47 (*)    All other components within normal limits  CBC - Abnormal; Notable for the following:    WBC 13.8 (*)    RBC 3.84 (*)    Hemoglobin 11.9 (*)    HCT 35.8 (*)    All other components within normal limits  HEPATIC FUNCTION PANEL - Abnormal; Notable for the following:    Total Protein 6.2 (*)      Albumin 3.2 (*)    ALT 14 (*)    All other components within normal limits  LIPASE, BLOOD  URINALYSIS, ROUTINE W REFLEX MICROSCOPIC (NOT AT [facility])  I-STAT TROPOININ, ED  CBG MONITORING, ED   ____________________________________________  EKG  Reviewed in MUSE.  ____________________________________________  RADIOLOGY  Dg Chest 2 View  07/19/2016  CLINICAL DATA:  Syncope, fall, diabetes EXAM: CHEST  2 VIEW COMPARISON:  03/25/2016 FINDINGS: Cardiomediastinal silhouette is stable. There is mild interstitial prominence bilaterally left greater than right probable chronic in nature. No convincing pulmonary edema or superimposed infiltrate. Bony thorax is unremarkable. IMPRESSION: Mild interstitial prominence bilateral probable chronic in nature. No convincing pulmonary edema or superimposed infiltrate. Electronically Signed   By: Lahoma Crocker M.D.   On: 07/19/2016 14:39   Ct Head Wo Contrast  07/19/2016  CLINICAL DATA:  Pain following fall EXAM: CT HEAD WITHOUT CONTRAST TECHNIQUE: Contiguous axial images were obtained from the base of the skull through the vertex without intravenous contrast. COMPARISON:  March 23, 2016 FINDINGS: Brain: Moderate diffuse atrophy is stable. There is no intracranial mass, hemorrhage, extra-axial fluid collection, or midline shift. There is mild small vessel disease in the centra semiovale bilaterally. Elsewhere gray-white compartments appear normal. No acute infarct is evident. Vascular: There is calcification in the distal vertebral arteries and cavernous carotid artery regions bilaterally. There is calcification in the carotid siphon regions as well. No hyperdense vessel evident. Skull: The bony calvarium appears intact. Sinuses/Orbits: Orbits appear symmetric bilaterally. Patient appears to have had an antrostomy on the left. There is mucosal thickening throughout the left maxillary antrum. There is opacification in multiple ethmoid air cells on the left. A much lesser  degree of opacification is noted in right-sided ethmoid air cells. There is opacification in a portion of the left frontal sinus. Other: Mastoid air cells are clear. IMPRESSION: Atrophy with mild periventricular small vessel disease. No intracranial mass, hemorrhage, or extra-axial fluid collection. No acute infarct evident. 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 Extensive mucoperiosteal thickening in the left maxillary antrum. There is opacification in multiple ethmoid air cells on the left as well as in the left frontal sinus region. Much milder paranasal sinus disease is noted in an anterior right ethmoid air cell region. There are foci of arterial vascular calcification. Electronically Signed   By: Lowella Grip III  M.D.   On: 07/19/2016 14:31   Dg Foot Complete Right  07/19/2016  CLINICAL DATA:  Fall. Right foot injury and pain. Initial encounter. Rheumatoid arthritis. EXAM: RIGHT FOOT COMPLETE - 3+ VIEW COMPARISON:  None. FINDINGS: Mildly displaced oblique fracture of the proximal phalanx of the middle toe is seen. No evidence of dislocation. Chronic erosive arthropathy is seen involving first MTP joint with partial subluxation of the proximal phalanx. Extensive peripheral vascular calcification noted. IMPRESSION: Mildly displaced fracture of proximal phalanx of the middle toe. Chronic erosive arthropathy and partial subluxation involving first MTP joint. This consistent with history of rheumatoid arthritis. Electronically Signed   By: Earle Gell M.D.   On: 07/19/2016 15:44    ____________________________________________   PROCEDURES  Procedure(s) performed:   Procedures  None ____________________________________________   INITIAL IMPRESSION / ASSESSMENT AND PLAN / ED COURSE  Pertinent labs & imaging results that were available during my care of the patient were reviewed by me and considered in my medical decision making (see chart for details).  Patient resents to the emergency  department for evaluation of syncope. He has significant medical comorbidities including congestive heart failure, anemia, currently out L liquids. Plan for CT scan of the head. CXR. Patient is currently on monitor with normal blood pressure. Will review labs. Given the patient's significant risk factors including CHF I have higher suspicion for cardiogenic syncope. The patient was also hypotensive with EMS for most of the ride to the ED.   Spoke with hospitalist and placed admission orders. CT head negative. Normal labs to this point. Will require overnight telemetry and possible repeat ECHO.  ____________________________________________  FINAL CLINICAL IMPRESSION(S) / ED DIAGNOSES  Final diagnoses:  Syncope and collapse    MEDICATIONS GIVEN DURING THIS VISIT:  None  NEW OUTPATIENT MEDICATIONS STARTED DURING THIS VISIT:  None   Note:  This document was prepared using Dragon voice recognition software and may include unintentional dictation errors.  Nanda Quinton, MD Emergency Medicine  Margette Fast, MD 07/19/16 [phone]

## 2016-07-19 NOTE — ED Notes (Signed)
Pt. BIB with [facility]. Was at lunch, stood up to get a drink, felt tired, lost consciousness and fell and hit his head. Small laceration above the L eye. A/O x4 and vitals stable upon arrival. Pt. On eliquis. Diabetic, CBG 216 per EMS

## 2016-07-20 ENCOUNTER — Inpatient Hospital Stay (HOSPITAL_COMMUNITY): Payer: Medicare Other

## 2016-07-20 ENCOUNTER — Other Ambulatory Visit: Payer: Self-pay | Admitting: Cardiology

## 2016-07-20 DIAGNOSIS — I2510 Atherosclerotic heart disease of native coronary artery without angina pectoris: Secondary | ICD-10-CM | POA: Diagnosis not present

## 2016-07-20 DIAGNOSIS — D72829 Elevated white blood cell count, unspecified: Secondary | ICD-10-CM | POA: Diagnosis not present

## 2016-07-20 DIAGNOSIS — I5042 Chronic combined systolic (congestive) and diastolic (congestive) heart failure: Secondary | ICD-10-CM

## 2016-07-20 DIAGNOSIS — N183 Chronic kidney disease, stage 3 (moderate): Secondary | ICD-10-CM | POA: Diagnosis not present

## 2016-07-20 DIAGNOSIS — R55 Syncope and collapse: Secondary | ICD-10-CM

## 2016-07-20 DIAGNOSIS — I10 Essential (primary) hypertension: Secondary | ICD-10-CM

## 2016-07-20 DIAGNOSIS — M25461 Effusion, right knee: Secondary | ICD-10-CM | POA: Diagnosis not present

## 2016-07-20 LAB — CBC
HCT: 39.3 % (ref 39.0–52.0)
Hemoglobin: 13 g/dL (ref 13.0–17.0)
MCH: 31.1 pg (ref 26.0–34.0)
MCHC: 33.1 g/dL (ref 30.0–36.0)
MCV: 94 fL (ref 78.0–100.0)
Platelets: 288 K/uL (ref 150–400)
RBC: 4.18 MIL/uL — ABNORMAL LOW (ref 4.22–5.81)
RDW: 14.4 % (ref 11.5–15.5)
WBC: 14.4 K/uL — ABNORMAL HIGH (ref 4.0–10.5)

## 2016-07-20 LAB — TROPONIN I
Troponin I: <0.03 ng/mL (ref <0.03)
Troponin I: 0.03 ng/mL (ref <0.03)

## 2016-07-20 LAB — BASIC METABOLIC PANEL WITH GFR
Anion gap: 11 (ref 5–15)
BUN: 19 mg/dL (ref 6–20)
CO2: 25 mmol/L (ref 22–32)
Calcium: 9.6 mg/dL (ref 8.9–10.3)
Chloride: 103 mmol/L (ref 101–111)
Creatinine, Ser: 1.39 mg/dL — ABNORMAL HIGH (ref 0.61–1.24)
GFR calc Af Amer: 58 mL/min — ABNORMAL LOW (ref >60)
GFR calc non Af Amer: 50 mL/min — ABNORMAL LOW (ref >60)
Glucose, Bld: 160 mg/dL — ABNORMAL HIGH (ref 65–99)
Potassium: 3.2 mmol/L — ABNORMAL LOW (ref 3.5–5.1)
Sodium: 139 mmol/L (ref 135–145)

## 2016-07-20 LAB — GLUCOSE, CAPILLARY
GLUCOSE-CAPILLARY: 248 mg/dL — AB (ref 65–99)
Glucose-Capillary: 177 mg/dL — ABNORMAL HIGH (ref 65–99)
Glucose-Capillary: 320 mg/dL — ABNORMAL HIGH (ref 65–99)
Glucose-Capillary: 224 mg/dL — ABNORMAL HIGH (ref 65–99)

## 2016-07-20 LAB — ECHOCARDIOGRAM COMPLETE
Height: 71 in
WEIGHTICAEL: 3298.08 [oz_av]

## 2016-07-20 MED ORDER — ACETAMINOPHEN 325 MG PO TABS
650.0000 mg | ORAL_TABLET | Freq: Once | ORAL | Status: AC
  Administered 2016-07-20: 650 mg via ORAL
  Filled 2016-07-20: qty 2

## 2016-07-20 MED ORDER — KETOROLAC TROMETHAMINE 30 MG/ML IJ SOLN
30.0000 mg | Freq: Four times a day (QID) | INTRAMUSCULAR | Status: DC | PRN
  Administered 2016-07-20 – 2016-07-23 (×4): 30 mg via INTRAVENOUS
  Filled 2016-07-20 (×4): qty 1

## 2016-07-20 MED ORDER — POTASSIUM CHLORIDE CRYS ER 20 MEQ PO TBCR
40.0000 meq | EXTENDED_RELEASE_TABLET | Freq: Once | ORAL | Status: AC
  Administered 2016-07-20: 40 meq via ORAL
  Filled 2016-07-20: qty 2

## 2016-07-20 MED ORDER — INSULIN ASPART 100 UNIT/ML ~~LOC~~ SOLN
0.0000 [IU] | Freq: Three times a day (TID) | SUBCUTANEOUS | Status: DC
Start: 2016-07-20 — End: 2016-07-23
  Administered 2016-07-20: 3 [IU] via SUBCUTANEOUS
  Administered 2016-07-20: 7 [IU] via SUBCUTANEOUS
  Administered 2016-07-21 (×3): 2 [IU] via SUBCUTANEOUS
  Administered 2016-07-22: 5 [IU] via SUBCUTANEOUS
  Administered 2016-07-22 – 2016-07-23 (×2): 2 [IU] via SUBCUTANEOUS

## 2016-07-20 NOTE — Progress Notes (Signed)
Echocardiogram 2D Echocardiogram has been performed.  Julian Ross 07/20/2016, 11:10 AM

## 2016-07-20 NOTE — Research (Signed)
Apixaban Validation Study (ClinicalTrials.gov Identifier: [id number]) RESEARCH SUBJECT. Purpose: Obtain fresh samples that will be used to assess the performances of STA-Apixaban Calibrator and STA-Apixaban Control in combination with the STA-Liquid Anti-Xa to determine the quantity of apixaban in plasma samples by measurement of its direct anti-Xa activity. Apixaban Validation Study is sponsored by [employer].The fresh samples will collected by completing a one time blood draw on approved patients.   Inclusion Criteria: Must meet AT LEAST one of the following:  weight </= 60kg or >/= 75 years or Hct <39% for male or < 36% for male or renal impairment or co-medication with ASA/NSAIDs, or co-medication with anti-platelet agents.  Apixaban Validation Study Informed Consent   Subject Name: Julian Ross  This patient, Julian Ross, has been consented to the above clinical trial according to FDA regulations, GCP guidelines and [employer]'s SOPs. The informed consent form and study design have been explained to this patient by this study coordinator at 11:45 AM on 07/20/2016. The patient demonstrated comprehension of this clinical trial and study requirements/expectations. No study procedures have been initiated before consenting of this patient. The patient was given sufficient time for reading the consent form. All risks, benefits and options have been thoroughly discussed and all questions were answered per the patient's satisfaction. This patient was not coerced in any way to participate in this clinical trial. This patient has voluntarily signed consent version 2.0 at 12:57 PM on 07/20/2016. A copy of the signed consent form was given to the patient and a copy was placed in the subject's medical record. Subject was thanked for their participation in research and contribution to science.  Dennison Mascot, RN Clinical Research Nurse  [facility], [facility] Office: [phone]

## 2016-07-20 NOTE — Progress Notes (Signed)
Pt's BP 172/74 while resting in bed. HR in the 60's. NP on call paged. Will cont to monitor pt.

## 2016-07-20 NOTE — Care Management Obs Status (Signed)
[facility] NOTIFICATION   Patient Details  Name: Julian Ross MRN: [id number] Date of Birth: 1946-11-30   Medicare Observation Status Notification Given:  Yes    Bethena Roys, RN 07/20/2016, 5:08 PM

## 2016-07-20 NOTE — Progress Notes (Signed)
Pt's trop .03, pt resting in bed and is CP free. NP on call text paged. Will cont to monitor pt.

## 2016-07-20 NOTE — Progress Notes (Signed)
Inpatient Diabetes Program Recommendations  AACE/ADA: New Consensus Statement on Inpatient Glycemic Control (2015)  Target Ranges:  Prepandial:   less than 140 mg/dL      Peak postprandial:   less than 180 mg/dL (1-2 hours)      Critically ill patients:  140 - 180 mg/dL   Lab Results  Component Value Date   GLUCAP 320* 07/20/2016   HGBA1C 9.2* 03/18/2016    Review of Glycemic ControlResults for Julian Ross, Julian Ross (MRN [id number]) as of 07/20/2016 13:16  Ref. Range 07/19/2016 17:36 07/19/2016 20:52 07/20/2016 07:44 07/20/2016 11:45  Glucose-Capillary Latest Ref Range: 65-99 mg/dL 195 (H) 230 (H) 177 (H) 320 (H)    Diabetes history: Type 2 diabetes Outpatient Diabetes medications: Humalog 50/50- 50 units with breakfast and 40 units with lunch and supper, Lantus 5 units q HS Current orders for Inpatient glycemic control:  Novolog 70/30 40 units bid, Lantus 5 units q HS, Novolog sensitive tid with meals  Inpatient Diabetes Program Recommendations:    Spoke with patient regarding home diabetes regimen and management.  He states that Lantus was added during his last hospitalization and he was switched to 70/30 instead of 50/50.  Patient endorses weight loss and states that he felt he was having to "eat" up to his large insulin doses.  Therefore he has been decreasing insulin dependent on his blood sugars and size of meals.  He prefers to stay on the 50/50 after discharge since he has several pens at home.  He may however need lower doses in order to avoid hypoglycemia.  He refused 70/30 this AM due to concerns regarding lows however blood sugar increased to 320 mg /dL at lunch time.  Consider reducing Novolog 70/30 to 30 units bid.  Also consider d/c of Lantus.  At discharge consider resumption of 50/50 however at reduced doses (Humalog 50/50- 30 units with breakfast, 30 units with lunch and 25 units at supper).  He also is interested in have a Novolog Correction scale so that if his blood sugar is high,  he can correct with only rapid acting insulin.  Patient would likely benefit with follow-up with Endocrinologist regarding diabetes and insulin doses.  Thanks, Adah Perl, RN, BC-ADM Inpatient Diabetes Coordinator Pager [phone]

## 2016-07-20 NOTE — Consult Note (Signed)
Cardiology Consult    Patient ID: Julian Ross MRN: [id number], DOB/AGE: 1946/11/26   Admit date: 07/19/2016 Date of Consult: 07/20/2016  Primary Physician: Kandice Hams, MD Primary Cardiologist: Dr. [name]  Requesting Provider: Dr. 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   Past Medical History   Past Medical History  Diagnosis Date  . Hypertension   . Septic shock ([place])   . Edema of leg   . Elevated troponin   . Anemia   . Acute hypoxemic respiratory failure ([employer])   . Altered mental status   . Atrial fibrillation, new onset ([place])   . Acute respiratory failure with hypoxia ([place])   . Chronic systolic CHF (congestive heart failure) ([place])   . Cardiomyopathy, ischemic   . Demand ischemia ([place])   . Hypokalemia   . Acute encephalopathy   . Anxiety state   . High cholesterol   . MI (myocardial infarction) ([place]) 1990; 1995; 1997  . OSA on CPAP   . Diabetes mellitus type 2 in obese ([place])   . History of blood transfusion     "related to OR"  . Hepatitis A   . Rheumatoid arthritis of multiple sites with negative rheumatoid factor ([place])   . Osteoarthritis   . Fibromyalgia   . Chronic lower back pain   . Depression   . CKD (chronic kidney disease), stage III   . AKI (acute kidney injury) ([place])   . Acute renal failure superimposed on stage 3 chronic kidney disease [facility])     Past Surgical History  Procedure Laterality Date  . Coronary angioplasty with stent placement      "I've got 15 stents" (07/19/2016)  . Coronary angioplasty  early 1990s  . Cardioversion N/A 06/04/2016    Procedure: CARDIOVERSION;  Surgeon: Skeet Latch, MD;  Location: [place];  Service: Cardiovascular;  Laterality: N/A;  . Tonsillectomy and adenoidectomy  1949  . Knee  arthroscopy Left 1990s  . Posterior lumbar fusion  2015    L4-5-S1  . Back surgery    . Cataract extraction w/ intraocular lens  implant, bilateral Bilateral 2000s     Allergies  Allergies  Allergen Reactions  . Ace Inhibitors Cough  . Atenolol Other (See Comments)    Unknown reaction  . Contrast Media [Iodinated Diagnostic Agents] Rash    Has to take benadryl prior to use  . Penicillins Hives and Rash    Has patient had a PCN reaction causing immediate rash, facial/tongue/throat swelling, SOB or lightheadedness with hypotension: Yes Has patient had a PCN reaction causing severe rash involving mucus membranes or skin necrosis: No Has patient had a PCN reaction that required hospitalization pt was in the hospital at time of last reaction - heart attack Has patient had a PCN reaction occurring within the last 10 years: No If all of the above answers are "NO", then may proceed with Cephalosporin use.    History of Present Illness    Julian Ross is a 70 yo male with PMH of chronic systolic and diastolic heart failure (LVEF 30-35%), paroxysmal atrial fibrillation ( on Eliquis s/p DCCV 06/04/2016), CAD s/p multiple PCIs, DM II and HTN. He was last seen in the office by Calton Golds on 06/19/2016 where he reported feeling well after his cardioversion, and  maintaining sinus rhythm. Did report that several days after his cardioversion he felt like his heart rate began to drop, and contact the office about this and his metoprolol was reduced from 75 mg twice a day to 50 mg twice a day. At that time his bradycardia seemed to continue so his metoprolol was changed to carvedilol 12.5 mg twice a day. At that office visit his heart rate was noted to be 48, and complained of feeling weak and tired. His weight was also noted to be up with office readings, but he stated his weight on his home scales had remained consistent. Denied any orthopnea or PND, but states that he did have dyspnea on exertion at his  baseline but had not been able to exert himself lately from feeling tired. The decision was made to decrease his carvedilol to 6.25 mg twice a day, to attempt to help with fatigued feeling. Suggestion was made for a possible POET to look for chronotropic incompetence. He was also instructed to take a half tablet of hydralazine 25 mg 3 times a day given his blood pressure was controlled at that time. His reported weight in the office was 212 pounds.   He presented to the [facility] ED on 07/19/2016 after having a syncopal episode that morning. Reported that he got up that morning and got ready to go out to eat with his wife, and felt normal at that time. Reports feeling to the restaurant, getting out of the car and feeling "off" and slightly lightheaded. He proceeded to walk inside and sat down at the table, and attempts to feel better. Then reports getting up to walk over to to a high top table to order, and feeling dizzy, when the room faded out. Reports his significantly last couple of seconds, and afterwards complained of bilateral knee pain with small laceration to forehead. States he did not eat or drink the fluids that morning, but to take his home medications. He does not have any chest pain, dyspnea, palpitations, increased lower extremity edema, nausea or vomiting. He was transported to the ED via EMS for further evaluation, it was noted to be hypotensive in route.  His admission labs showed a creatinine of 1.65, negative POC troponin, hemoglobin 11.9. CT of the head was negative, and chest x-ray showed no acute edema or infiltrate. NG showed what appears to be sinus rhythm but distinct P waves are difficult to determine. He was admitted to internal medicine for further management. Troponins have been cycled and negative 3, creatinine improved this morning to 1.3, and TSH 1.2.   Inpatient Medications    . apixaban  5 mg Oral BID  . carvedilol  6.25 mg Oral BID WC  . Chlorhexidine Gluconate Cloth   6 each Topical Q0600  . cholecalciferol  5,000 Units Oral QHS  . fenofibrate  160 mg Oral Daily  . folic acid  1 mg Oral Daily  . furosemide  20 mg Oral Daily  . gabapentin  100 mg Oral Daily  . hydrALAZINE  12.5 mg Oral TID  . insulin aspart  0-9 Units Subcutaneous TID WC  . insulin aspart protamine- aspart  40 Units Subcutaneous BID WC  . insulin glargine  5 Units Subcutaneous Daily  . latanoprost  1 drop Both Eyes QHS  . leucovorin  10 mg Oral See admin instructions  . magnesium oxide  400 mg Oral BID  . methotrexate (PF)  15 mg Intramuscular Weekly  . mupirocin ointment  1 application Nasal  BID  . omega-3 acid ethyl esters  1 capsule Oral BID  . PARoxetine  40 mg Oral Daily  . predniSONE  5 mg Oral Q breakfast  . sodium chloride flush  3 mL Intravenous Q12H  . tamsulosin  0.4 mg Oral Daily  . tiZANidine  4 mg Oral TID  . topiramate  25 mg Oral BID    Family History    Family History  Problem Relation Age of Onset  . Heart failure Mother   . Hyperlipidemia Mother   . Hypertension Mother   . Heart attack Father   . Heart attack Brother     Social History    Social History   Social History  . Marital Status: Married    Spouse Name: N/A  . Number of Children: N/A  . Years of Education: N/A   Occupational History  . Not on file.   Social History Main Topics  . Smoking status: Never Smoker   . Smokeless tobacco: Never Used  . Alcohol Use: No  . Drug Use: No  . Sexual Activity: Not Currently   Other Topics Concern  . Not on file   Social History Narrative     Review of Systems    General:  No chills, fever, night sweats or weight changes.  Cardiovascular: See HPI. Dermatological: No rash, lesions/masses Respiratory: No cough, dyspnea Urologic: No hematuria, dysuria Abdominal:   No nausea, vomiting, diarrhea, bright red blood per rectum, melena, or hematemesis Neurologic:  No visual changes, wkns, changes in mental status. All other systems reviewed  and are otherwise negative except as noted above.  Physical Exam    Blood pressure 179/86, pulse 78, temperature 99.2 F (37.3 C), temperature source Oral, resp. rate 20, height 5\' 11"  (1.803 m), weight 206 lb 2.1 oz (93.5 kg), SpO2 97 %.  General: Pleasant older male, NAD, small abrasion to forehead.  Psych: Normal affect. Neuro: Alert and oriented X 3. Moves all extremities spontaneously. HEENT: Normal  Neck: Supple without bruits or JVD. Lungs:  Resp regular and unlabored, Crackles in bases. Heart: RRR no s3, s4, or murmurs. Abdomen: Soft, non-tender, non-distended, BS + x 4.  Extremities: No clubbing, cyanosis or edema. DP/PT/Radials 2+ and equal bilaterally.  Labs    Troponin [facility] Test)  Recent Labs  07/19/16 1357  TROPIPOC 0.04    Recent Labs  07/19/16 1918 07/19/16 2253 07/20/16 0502  TROPONINI <0.03 <0.03 0.03*   Lab Results  Component Value Date   WBC 14.4* 07/20/2016   HGB 13.0 07/20/2016   HCT 39.3 07/20/2016   MCV 94.0 07/20/2016   PLT 288 07/20/2016    Recent Labs Lab 07/19/16 1918 07/20/16 0502  NA 138 139  K 3.4* 3.2*  CL 100* 103  CO2 29 25  BUN 19 19  CREATININE 1.51* 1.39*  CALCIUM 9.6 9.6  PROT 7.3  --   BILITOT 0.6  --   ALKPHOS 45  --   ALT 15*  --   AST 23  --   GLUCOSE 214* 160*   Lab Results  Component Value Date   CHOL 123 03/22/2016   HDL 25* 03/22/2016   LDLCALC 67 03/22/2016   TRIG 154* 03/22/2016   Lab Results  Component Value Date   DDIMER 0.81* 03/19/2016     Radiology Studies    Dg Chest 2 View  07/19/2016  CLINICAL DATA:  Syncope, fall, diabetes EXAM: CHEST  2 VIEW COMPARISON:  03/25/2016 FINDINGS: Cardiomediastinal silhouette is stable.  There is mild interstitial prominence bilaterally left greater than right probable chronic in nature. No convincing pulmonary edema or superimposed infiltrate. Bony thorax is unremarkable. IMPRESSION: Mild interstitial prominence bilateral probable chronic in nature.  No convincing pulmonary edema or superimposed infiltrate. Electronically Signed   By: Lahoma Crocker M.D.   On: 07/19/2016 14:39   Ct Head Wo Contrast  07/19/2016  CLINICAL DATA:  Pain following fall EXAM: CT HEAD WITHOUT CONTRAST TECHNIQUE: Contiguous axial images were obtained from the base of the skull through the vertex without intravenous contrast. COMPARISON:  March 23, 2016 FINDINGS: Brain: Moderate diffuse atrophy is stable. There is no intracranial mass, hemorrhage, extra-axial fluid collection, or midline shift. There is mild small vessel disease in the centra semiovale bilaterally. Elsewhere gray-white compartments appear normal. No acute infarct is evident. Vascular: There is calcification in the distal vertebral arteries and cavernous carotid artery regions bilaterally. There is calcification in the carotid siphon regions as well. No hyperdense vessel evident. Skull: The bony calvarium appears intact. Sinuses/Orbits: Orbits appear symmetric bilaterally. Patient appears to have had an antrostomy on the left. There is mucosal thickening throughout the left maxillary antrum. There is opacification in multiple ethmoid air cells on the left. A much lesser degree of opacification is noted in right-sided ethmoid air cells. There is opacification in a portion of the left frontal sinus. Other: Mastoid air cells are clear. IMPRESSION: Atrophy with mild periventricular small vessel disease. No intracranial mass, hemorrhage, or extra-axial fluid collection. No acute infarct evident. 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 Extensive mucoperiosteal thickening in the left maxillary antrum. There is opacification in multiple ethmoid air cells on the left as well as in the left frontal sinus region. Much milder paranasal sinus disease is noted in an anterior right ethmoid air cell region. There are foci of arterial vascular calcification. Electronically Signed   By: Lowella Grip III M.D.   On: 07/19/2016 14:31   Dg  Foot Complete Right  07/19/2016  CLINICAL DATA:  Fall. Right foot injury and pain. Initial encounter. Rheumatoid arthritis. EXAM: RIGHT FOOT COMPLETE - 3+ VIEW COMPARISON:  None. FINDINGS: Mildly displaced oblique fracture of the proximal phalanx of the middle toe is seen. No evidence of dislocation. Chronic erosive arthropathy is seen involving first MTP joint with partial subluxation of the proximal phalanx. Extensive peripheral vascular calcification noted. IMPRESSION: Mildly displaced fracture of proximal phalanx of the middle toe. Chronic erosive arthropathy and partial subluxation involving first MTP joint. This consistent with history of rheumatoid arthritis. Electronically Signed   By: Earle Gell M.D.   On: 07/19/2016 15:44    ECG & Cardiac Imaging    EKG: Appears to be SR but distinct p waves are difficult to determine. Junctional?  Echo: 03/19/2016  Study Conclusions  - Left ventricle: The cavity size was normal. Wall thickness was  increased in a pattern of mild LVH. Systolic function was  moderately to severely reduced. The estimated ejection fraction  was in the range of 30% to 35%. Global hypokinesis with severe  inferior hypokinesis to akinesis. The study is not technically  sufficient to allow evaluation of LV diastolic function. - Mitral valve: Mildly thickened leaflets . There was mild  regurgitation. - Left atrium: The atrium was normal in size. - Right atrium: The atrium was mildly dilated. - Tricuspid valve: There was mild regurgitation. - Pulmonary arteries: PA peak pressure: 35 mm Hg (S). - Inferior vena cava: The vessel was dilated. The respirophasic  diameter changes were blunted (<  50%), consistent with elevated  central venous pressure.  Impressions:  - LVEF 30-35%, global hypokinesis and inferior akinesis, mild MR,  mild RAE, mild TR, RVSP 35 mmHg, dilated IVC.  Assessment & Plan    Julian Ross is a 70 yo male with PMH of chronic systolic and  diastolic heart failure (LVEF 30-35%), paroxysmal atrial fibrillation ( on Eliquis s/p DCCV 06/04/2016), CAD s/p multiple PCIs, DM II and HTN. He was last seen in the office by Calton Golds on 06/19/2016 where he reported feeling well after his cardioversion, and maintaining sinus rhythm. Did report that several days after his cardioversion he felt like his heart rate began to drop, and contact the office about this and his metoprolol was reduced from 75 mg twice a day to 50 mg twice a day. At that time his bradycardia seemed to continue so his metoprolol was changed to carvedilol 12.5 mg twice a day. At that office visit his heart rate was noted to be 48, and complained of feeling weak and tired. His weight was also noted to be up with office readings, but he stated his weight on his home scales had remained consistent. Denied any orthopnea or PND, but states that he did have dyspnea on exertion at his baseline but had not been able to exert himself lately from feeling tired. The decision was made to decrease his carvedilol to 6.25 mg twice a day, to attempt to help with fatigued feeling. Suggestion was made for a possible POET to look for chronotropic incompetence. He was also instructed to take a half tablet of hydralazine 25 mg 3 times a day given his blood pressure was controlled at that time. His reported weight in the office was 212 pounds.   1. Syncope: Reports getting up yesterday morning and going to eat with his wife. States when he got out of the car, he felt slightly dizzy and weak. Proceeded to walk inside and sat down. Upon standing to walk to a high top table he had a syncopal episode, that was brief. Reported hypotensive with EMS en route. Denies any chest pain, palpitations, dyspnea, or LEE. Trop neg x3. EKG appears to be a junctional rhythm, but telemetry shows mostly SR. Reports having had extensive cardiac hx in the 90s with multiple stents. Has been on good medical therapy. -- Last 2D echo in  3/17 showed an EF of 30-35% which he reports was a decrease. Repeat 2D echo pending at this time -- Believe he would benefit from outpatient myoview to determine any further ischemia and 30 day event monitor (will order).   2. PAF: Appears in junctional rhythm at this time. Rate controlled.  -- This patients CHA2DS2-VASc Score and unadjusted Ischemic Stroke Rate (% per year) is equal to 7.2 % stroke rate/year from a score of 5 Above score calculated as 1 point each if present [CHF, HTN, DM, Vascular=MI/PAD/Aortic Plaque, Age if 65-74, or Male] Above score calculated as 2 points each if present [Age > 75, or Stroke/TIA/TE] -- Continue home medications including coreg and eliquis  3. Combined HF: Last 2D echo showed EF of 30-35%, repeat pending -- Does not appear volume overloaded, would continue current medications  4. CKD III: Admission Cr 1.65>>1.39, monitor  5. HTN: Continue with home medications  6. HL: Continue statin  Weston Brass Reino Bellis, NP-C Pager [phone] 07/20/2016, 11:27 AM  Patient seen and examined and history reviewed. Agree with above findings and plan. 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  Prior history of orthostatic dizziness which improved with change in medication. Syncopal episode preceded by symptoms of lightheadedness and weakness. Blacked out with standing up. Denies any dyspnea or chest pain. His last Echo showed EF 30-35%. Patient reports prior EF prior to that was 50%. He has a history of prior MI with multiple stents in the past ( 6 stents placed for dissected coronary at [facility] in 1990s). Last stress test was in [place] 6-8 months ago.  On exam he has a small laceration of his brow. No JVD or bruit. Lungs are clear. CV without gallop or murmur. No edema  Ecg shows Junctional rhythm rate 62. Nonspecific TWA  Telemetry reviewed: NSR with some intermittent junctional rhythm with rate in 70s. Rare PVC and PAC  Echo now shows EF  50%. There is akinesis of the basal inferior wall.  Impression: syncope most likely related to transient hypotension. He has a history of labile BP in the past. He has actually been doing well on his current regimen. Although he has some intermittent junctional rhythm his rate is unchanged and I doubt this is contributing  I have recommended continuing current therapy. 30 day event monitor at discharge. I would recommend a stress Myoview as outpatient since it has been quite a while since his last ischemic evaluation. Will arrange follow up with Dr. [name]. He is instructed not to drive for 6 months.  Peter Martinique, [place] 07/20/2016 12:50 PM

## 2016-07-20 NOTE — Progress Notes (Signed)
Pt temp 100.1 orally despite tylenol that was given at 1400 and too soon to give another dose.  Pt also complaint of chills.  Dr. Charlies Silvers notified and orders received to give an extra one time dose of 650 mg tylenol.  Will continue to closely monitor.

## 2016-07-20 NOTE — Evaluation (Signed)
Physical Therapy Evaluation Patient Details Name: Julian Ross MRN: [id number] DOB: 27-Jun-1946 Today's Date: 07/20/2016   History of Present Illness  70 yo male with PMH of chronic systolic and diastolic heart failure (LVEF 30-35%), paroxysmal atrial fibrillation, CAD s/p multiple PCIs who presented to the [facility] ED with syncope  Clinical Impression  Patient demonstrates deficits in functional mobility as indicated below. Will need continued skilled PT to address deficits and maximize function. Will see as indicated and progress as tolerated. OF NOTE: Patient with extreme pain and swelling right knee. Bruising noted over right foot. Nsg aware, will await imaging results to further assess needs.     Follow Up Recommendations Home health PT;Supervision/Assistance - 24 hour    Equipment Recommendations  Other (comment) (TBD pending RLE review)    Recommendations for Other Services       Precautions / Restrictions Precautions Precautions: Fall      Mobility  Bed Mobility Overal bed mobility: Needs Assistance Bed Mobility: Supine to Sit     Supine to sit: Mod assist     General bed mobility comments: Moderate assist to come to EOB with UE support, assist for movement of RLE to EOB  Transfers Overall transfer level: Needs assistance Equipment used: Rolling walker (2 wheeled) Transfers: Sit to/from [facility] Sit to Stand: Mod assist;+2 physical assistance Stand pivot transfers: Mod assist       General transfer comment: Extremely painful to elevate to standing in RLE (knee)  Ambulation/Gait Ambulation/Gait assistance: Min assist Ambulation Distance (Feet): 4 Feet Assistive device: Rolling walker (2 wheeled) Gait Pattern/deviations: Step-to pattern Gait velocity: decreased   General Gait Details: patient unable to place weight through RLE, attempted hop to technique in [place] but patient extemely limited  Stairs            Wheelchair  Mobility    Modified Rankin (Stroke Patients Only)       Balance Overall balance assessment: Needs assistance   Sitting balance-Leahy Scale: Good     Standing balance support: Bilateral upper extremity supported;During functional activity Standing balance-Leahy Scale: Poor                               Pertinent Vitals/Pain Pain Assessment: Faces Faces Pain Scale: Hurts whole lot Pain Location: RLE Knee pain (anterior) Pain Descriptors / Indicators: Sharp Pain Intervention(s): Limited activity within patient's tolerance;Monitored during session;Repositioned    Home Living Family/patient expects to be discharged to:: Private residence Living Arrangements: Spouse/significant other Available Help at Discharge: Family Type of Home: House Home Access: Stairs to enter     Home Layout: One level Home Equipment: Environmental consultant - 2 wheels;Crutches      Prior Function                 Hand Dominance   Dominant Hand: Right    Extremity/Trunk Assessment   Upper Extremity Assessment: Overall WFL for tasks assessed           Lower Extremity Assessment: RLE deficits/detail RLE Deficits / Details: extremely edemetous       Communication   Communication: No difficulties  Cognition Arousal/Alertness: Awake/alert Behavior During Therapy: WFL for tasks assessed/performed Overall Cognitive Status: Within Functional Limits for tasks assessed                      General Comments      Exercises        Assessment/Plan  PT Assessment Patient needs continued PT services  PT Diagnosis Difficulty walking;Abnormality of gait;Acute pain   PT Problem List Decreased strength;Decreased activity tolerance;Decreased balance;Decreased mobility;Decreased knowledge of use of DME;Cardiopulmonary status limiting activity;Pain  PT Treatment Interventions DME instruction;Gait training;Stair training;Functional mobility training;Therapeutic  activities;Therapeutic exercise;Balance training;Patient/family education   PT Goals (Current goals can be found in the Care Plan section) Acute Rehab PT Goals Patient Stated Goal: to go home PT Goal Formulation: With patient Time For Goal Achievement: 08/03/16 Potential to Achieve Goals: Good    Frequency Min 3X/week   Barriers to discharge        Co-evaluation               End of Session Equipment Utilized During Treatment: Gait belt Activity Tolerance: Patient limited by pain Patient left: Other (comment) (with nurse tech on [facility]) Nurse Communication: Mobility status         Time: [id number] PT Time Calculation (min) (ACUTE ONLY): 19 min   Charges:   PT Evaluation $PT Eval Moderate Complexity: 1 Procedure     PT G CodesDuncan Dull 08-18-2016, 4:08 PM Alben Deeds, [place] DPT  [phone]

## 2016-07-20 NOTE — Care Management CC44 (Signed)
Condition Code 44 Documentation Completed  Patient Details  Name: Julian Ross MRN: [id number] Date of Birth: [birth year]   Condition Code 44 given:  Yes Patient signature on Condition Code 44 notice:  Yes Documentation of 2 MD's agreement:  Yes Code 44 added to claim:  Yes    Bethena Roys, RN 07/20/2016, 5:08 PM

## 2016-07-20 NOTE — Progress Notes (Signed)
Patient ID: Julian Ross, male   DOB: 1946/10/29, 69 y.o.   MRN: [id number]  PROGRESS NOTE    Julian Ross  [id number] DOB: 1946-09-05 DOA: 07/19/2016  PCP: Kandice Hams, MD   Brief Narrative:  70 y.o. male with medical history significant for chronic combined CHF with last 2 D ECHO in 02/2016 with EF 30%, hypertension, DM on insulin, dyslipidemia, RA on prednisone, atrial fibrillation on apixaban who presented to [facility] with reprots of loss of consciousness on the admission. He was getting ready to go out to a restaurant with his family and everything was fine. He felt fine throughout the lunch time with no chest pain or shortness of breath until the moment he got up ready to leave and then he lost consciousness for about few seconds. He dose report having occasional dizziness and usually this is his prodromal sign and he sits down. This time he did not have prodromal symptoms and just collapsed.    ED Course: In ED, pt was hemodynamically stable. Blood work was notable for WBC count 13.8, hemoglobin 11.9, Cr 1.65 (recent baseline 2.13). CT head showed no acute intracranial findings. CXR showed mild interstitial prominence bilaterally probably chronic in nature. Right foot x ray showed displaced fracture of proximal phalanx, mild, middle toe. Erosive arthropathy and partial subluxation of first MTP joint consistent with RA.  Assessment & Plan:    Principal Problem:  Syncope and collapse - Worrisome for cardiac etiology - The 2 sets of cardiac enzymes are within normal limits and the last set mild elevation at 0.03 - His last 2 D ECHO was in 02/2016 with EF 30%. He does have evidence of structural heart disease on this 2 D ECHO with global hypokinesis with severe inferior hypokinesis to akinesis but other than dizziness does not report significant limitation with ordinary activities - He does report intermittent dizziness but no syncope - 2-D echo is pending this morning - Follow-up  cardiology recommendations - Continue Lasix 20 mg daily - Of note, patient's son reported that patient has been taking narcotics for pain control for rheumatoid arthritis and although compliant with dosing and frequency there were times when he took more medications for pain control. Asked to check UDS which is pending at this time.  Active Problems:  Rheumatoid arthritis of multiple sites with negative rheumatoid factor (HCC) - Continue low dose prednisone   CAD (coronary artery disease), native artery, no angina pectoris - Continue apixaban   Paroxysmal atrial fibrillation (HCC) - CHADS vasc score 4 - On AC with apixaban - Rate controlled with carvedilol   Chronic combined systolic and diastolic CHF (congestive heart failure) (HCC) - Last 2 D ECHO in 02/2016 with EF 30% and evidence of structural heart disease on this 2 D ECHO with global hypokinesis with severe inferior hypokinesis to akinesis - Follow up 2 D EHCO on this admission   CKD (chronic kidney disease) stage 3, GFR 30-59 ml/min - Baseline CR in 02/2016 was 2.13 - Creatinine within baseline range   Leukocytosis - Likely due to chronic steroids    Anemia of chronic kidney failure - Stable hemoglobin   Uncontrolled diabetes mellitus with diabetic nephropathy, with long-term current use of insulin (HCC) - A1c in 02/2016 was 9.2 indicating poor glycemic control - Continue Lantus 5 units daily, NovoLog 40 units twice daily and sliding scale insulin   Benign essential HTN - Continue carvedilol, Lasix, Hydralazine    Dyslipidemia associated with type 2 diabetes mellitus ([place]) - Continue statin therapy  DVT prophylaxis: On apixaban  Code Status: full code  Family Communication: No family at the bedside; spoke with pt son over the phone today  Disposition Plan: home today or tomorrow depending on cardiology recommendations   Consultants:   Cardiology   Procedures:   2 D ECHO pending    Antimicrobials:   None    Subjective: No overnight events.   Objective: Filed Vitals:   07/20/16 0010 07/20/16 0417 07/20/16 0556 07/20/16 0746  BP: 134/60 172/74  179/86  Pulse: 57 67  78  Temp: 98.4 F (36.9 C) 99.2 F (37.3 C)  99.2 F (37.3 C)  TempSrc: Oral Oral  Oral  Resp: 20 20    Height:      Weight:   93.5 kg (206 lb 2.1 oz)   SpO2: 100% 100%  97%    Intake/Output Summary (Last 24 hours) at 07/20/16 0918 Last data filed at 07/20/16 0746  Gross per 24 hour  Intake      0 ml  Output   1720 ml  Net  -1720 ml   Filed Weights   07/19/16 1342 07/20/16 0556  Weight: 92.987 kg (205 lb) 93.5 kg (206 lb 2.1 oz)    Examination:  General exam: Appears calm and comfortable  Respiratory system: Clear to auscultation. Respiratory effort normal. Cardiovascular system: S1 & S2 heard, Rate controlled  Gastrointestinal system: Abdomen is nondistended, soft and nontender. No organomegaly or masses felt. Normal bowel sounds heard. Central nervous system: Alert and oriented. No focal neurological deficits. Extremities: Symmetric 5 x 5 power. Skin: No rashes, lesions or ulcers Psychiatry: Judgement and insight appear normal. Mood & affect appropriate.   Data Reviewed: I have personally reviewed following labs and imaging studies  CBC:  Recent Labs Lab 07/19/16 1343 07/19/16 1918 07/20/16 0502  WBC 13.8* 11.8* 14.4*  NEUTROABS  --  10.0*  --   HGB 11.9* 13.1 13.0  HCT 35.8* 39.9 39.3  MCV 93.2 94.5 94.0  PLT 236 282 [id number]   Basic Metabolic Panel:  Recent Labs Lab 07/19/16 1343 07/19/16 1918 07/20/16 0502  NA 134* 138 139  K 3.8 3.4* 3.2*  CL 103 100* 103  CO2 24 29 25   GLUCOSE 295* 214* 160*  BUN 21* 19 19  CREATININE 1.65* 1.51* 1.39*  CALCIUM 9.0 9.6 9.6  MG  --  2.1  --   PHOS  --  3.9  --    GFR: Estimated Creatinine Clearance: 58.6 mL/min (by C-G formula based on Cr of 1.39). Liver Function Tests:  Recent Labs Lab 07/19/16 1343  07/19/16 1918  AST 25 23  ALT 14* 15*  ALKPHOS 44 45  BILITOT 0.6 0.6  PROT 6.2* 7.3  ALBUMIN 3.2* 3.5    Recent Labs Lab 07/19/16 1343  LIPASE 26   No results for input(s): AMMONIA in the last 168 hours. Coagulation Profile:  Recent Labs Lab 07/19/16 1918  INR 1.29   Cardiac Enzymes:  Recent Labs Lab 07/19/16 1918 07/19/16 2253 07/20/16 0502  TROPONINI <0.03 <0.03 0.03*   BNP (last 3 results) No results for input(s): PROBNP in the last 8760 hours. HbA1C: No results for input(s): HGBA1C in the last 72 hours. CBG:  Recent Labs Lab 07/19/16 1736 07/19/16 2052 07/20/16 0744  GLUCAP 195* 230* 177*   Lipid Profile: No results for input(s): CHOL, HDL, LDLCALC, TRIG, CHOLHDL, LDLDIRECT in the last 72 hours. Thyroid Function Tests:  Recent Labs  07/19/16 1918  TSH 1.209   Anemia Panel: No  results for input(s): VITAMINB12, FOLATE, FERRITIN, TIBC, IRON, RETICCTPCT in the last 72 hours. Urine analysis: No results found for: COLORURINE, APPEARANCEUR, LABSPEC, PHURINE, GLUCOSEU, HGBUR, BILIRUBINUR, KETONESUR, PROTEINUR, UROBILINOGEN, NITRITE, LEUKOCYTESUR Sepsis Labs: @LABRCNTIP (procalcitonin:4,lacticidven:4)   MRSA PCR Screening     Status: Abnormal   Collection Time: 07/19/16  5:57 PM  Result Value Ref Range Status   MRSA by PCR POSITIVE (A) NEGATIVE Final     Radiology Studies: Dg Chest 2 View 07/19/2016  Mild interstitial prominence bilateral probable chronic in nature. No convincing pulmonary edema or superimposed infiltrate.   Ct Head Wo Contrast 07/19/2016  Atrophy with mild periventricular small vessel disease. No intracranial mass, hemorrhage, or extra-axial fluid collection. No acute infarct evident. 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 Extensive mucoperiosteal thickening in the left maxillary antrum. There is opacification in multiple ethmoid air cells on the left as well as in the left frontal sinus region. Much milder paranasal sinus disease is  noted in an anterior right ethmoid air cell region. There are foci of arterial vascular calcification.   Dg Foot Complete Right 07/19/2016   Mildly displaced fracture of proximal phalanx of the middle toe. Chronic erosive arthropathy and partial subluxation involving first MTP joint. This consistent with history of rheumatoid arthritis.     Scheduled Meds: . apixaban  5 mg Oral BID  . carvedilol  6.25 mg Oral BID WC  . cholecalciferol  5,000 Units Oral QHS  . fenofibrate  160 mg Oral Daily  . folic acid  1 mg Oral Daily  . furosemide  20 mg Oral Daily  . gabapentin  100 mg Oral Daily  . hydrALAZINE  12.5 mg Oral TID  . insulin aspart   40 Units Subcutaneous BID WC  . insulin glargine  5 Units Subcutaneous Daily  . leucovorin  10 mg Oral See admin instructions  . magnesium oxide  400 mg Oral BID  . methotrexate (PF)  15 mg Intramuscular Weekly  . mupirocin ointment  1 application Nasal BID  . omega-3 acid ethyl  1 capsule Oral BID  . PARoxetine  40 mg Oral Daily  . predniSONE  5 mg Oral Q breakfast  . tamsulosin  0.4 mg Oral Daily  . tiZANidine  4 mg Oral TID  . topiramate  25 mg Oral BID   Continuous Infusions:    LOS: 1 day    Time spent: 25 minutes  Greater than 50% of the time spent on counseling and coordinating the care.   Leisa Lenz, MD [facility] Pager [phone]  If 7PM-7AM, please contact night-coverage [email or web address] Password [email or web address] 07/20/2016, 9:18 AM

## 2016-07-21 ENCOUNTER — Encounter (HOSPITAL_COMMUNITY): Payer: Self-pay | Admitting: Orthopedic Surgery

## 2016-07-21 DIAGNOSIS — S93141A Subluxation of metatarsophalangeal joint of right great toe, initial encounter: Secondary | ICD-10-CM | POA: Diagnosis not present

## 2016-07-21 DIAGNOSIS — M25469 Effusion, unspecified knee: Secondary | ICD-10-CM | POA: Insufficient documentation

## 2016-07-21 DIAGNOSIS — N183 Chronic kidney disease, stage 3 (moderate): Secondary | ICD-10-CM | POA: Diagnosis not present

## 2016-07-21 DIAGNOSIS — D631 Anemia in chronic kidney disease: Secondary | ICD-10-CM | POA: Diagnosis not present

## 2016-07-21 DIAGNOSIS — M25461 Effusion, right knee: Secondary | ICD-10-CM

## 2016-07-21 DIAGNOSIS — I2510 Atherosclerotic heart disease of native coronary artery without angina pectoris: Secondary | ICD-10-CM | POA: Diagnosis not present

## 2016-07-21 DIAGNOSIS — R55 Syncope and collapse: Secondary | ICD-10-CM | POA: Diagnosis not present

## 2016-07-21 DIAGNOSIS — I5042 Chronic combined systolic (congestive) and diastolic (congestive) heart failure: Secondary | ICD-10-CM | POA: Diagnosis not present

## 2016-07-21 LAB — URINALYSIS, ROUTINE W REFLEX MICROSCOPIC
BILIRUBIN URINE: NEGATIVE
GLUCOSE, UA: NEGATIVE mg/dL
HGB URINE DIPSTICK: NEGATIVE
KETONES UR: NEGATIVE mg/dL
LEUKOCYTES UA: NEGATIVE
Nitrite: NEGATIVE
PH: 7 (ref 5.0–8.0)
PROTEIN: NEGATIVE mg/dL
Specific Gravity, Urine: 1.007 (ref 1.005–1.030)

## 2016-07-21 LAB — BASIC METABOLIC PANEL
ANION GAP: 6 (ref 5–15)
BUN: 21 mg/dL — ABNORMAL HIGH (ref 6–20)
CO2: 26 mmol/L (ref 22–32)
Calcium: 9.1 mg/dL (ref 8.9–10.3)
Chloride: 100 mmol/L — ABNORMAL LOW (ref 101–111)
Creatinine, Ser: 1.71 mg/dL — ABNORMAL HIGH (ref 0.61–1.24)
GFR calc Af Amer: 45 mL/min — ABNORMAL LOW (ref >60)
GFR, EST NON AFRICAN AMERICAN: 39 mL/min — AB (ref >60)
Glucose, Bld: 184 mg/dL — ABNORMAL HIGH (ref 65–99)
POTASSIUM: 4 mmol/L (ref 3.5–5.1)
SODIUM: 132 mmol/L — AB (ref 135–145)

## 2016-07-21 LAB — GLUCOSE, CAPILLARY
GLUCOSE-CAPILLARY: 199 mg/dL — AB (ref 65–99)
GLUCOSE-CAPILLARY: 228 mg/dL — AB (ref 65–99)
Glucose-Capillary: 165 mg/dL — ABNORMAL HIGH (ref 65–99)
Glucose-Capillary: 176 mg/dL — ABNORMAL HIGH (ref 65–99)
Glucose-Capillary: 114 mg/dL — ABNORMAL HIGH (ref 65–99)

## 2016-07-21 LAB — GRAM STAIN

## 2016-07-21 LAB — CBC
HCT: 36.3 % — ABNORMAL LOW (ref 39.0–52.0)
Hemoglobin: 12 g/dL — ABNORMAL LOW (ref 13.0–17.0)
MCH: 30.8 pg (ref 26.0–34.0)
MCHC: 33.1 g/dL (ref 30.0–36.0)
MCV: 93.1 fL (ref 78.0–100.0)
PLATELETS: 247 10*3/uL (ref 150–400)
RBC: 3.9 MIL/uL — AB (ref 4.22–5.81)
RDW: 14.4 % (ref 11.5–15.5)
WBC: 16.5 10*3/uL — AB (ref 4.0–10.5)

## 2016-07-21 LAB — SYNOVIAL CELL COUNT + DIFF, W/ CRYSTALS
Lymphocytes-Synovial Fld: 3 % (ref 0–20)
MONOCYTE-MACROPHAGE-SYNOVIAL FLUID: 1 % — AB (ref 50–90)
Neutrophil, Synovial: 96 % — ABNORMAL HIGH (ref 0–25)
WBC, Synovial: 72000 /mm3 — ABNORMAL HIGH (ref 0–200)

## 2016-07-21 MED ORDER — SODIUM CHLORIDE 0.9 % IV SOLN: Freq: Once | INTRAVENOUS | Status: DC

## 2016-07-21 MED ORDER — SODIUM CHLORIDE 0.9 % IV BOLUS (SEPSIS)
250.0000 mL | Freq: Once | INTRAVENOUS | Status: AC
  Administered 2016-07-21: 250 mL via INTRAVENOUS

## 2016-07-21 MED ORDER — LIDOCAINE-EPINEPHRINE 2 %-1:100000 IJ SOLN
20.0000 mL | INTRAMUSCULAR | Status: AC
  Administered 2016-07-21: 5 mL via INTRADERMAL
  Filled 2016-07-21: qty 20

## 2016-07-21 NOTE — Consult Note (Signed)
Orthopaedic Trauma Service (OTS) Consult   Reason for Consult: R knee effusion Referring Physician: Leisa Lenz, MD (internal medicine)   Pt seen and evaluated with Dr. Marcelino Scot   HPI: Julian Ross is an 70 y.o. white male with extensive medical history including CHF, DM on insulin, RA, A fib on chronic anticoagulation who sustained a  Fall after losing consciousness. Pt was brought to [facility] and admitted to the medical service. Pt found to have a fracture of the proximal phalanx of 3rd toe on right foot. xrays of foot also notable for erosive arthropathy of the first MTP of proximal phalanx with partial subluxation. Pt not complaining of acute foot pain. xrays of R knee were notable for effusion but no fracture. Ortho consulted for eval of R knee effusion   Pt reports polyarthralgia. No additional acute pain noted  Decreased sensation to feet noted as well   States that his sugars have been well controlled until this past week   Past Medical History  Diagnosis Date  . Hypertension   . Septic shock ([place])   . Edema of leg   . Elevated troponin   . Anemia   . Acute hypoxemic respiratory failure ([place])   . Altered mental status   . Atrial fibrillation, new onset ([place])   . Acute respiratory failure with hypoxia ([place])   . Chronic systolic CHF (congestive heart failure) ([place])   . Cardiomyopathy, ischemic   . Demand ischemia ([place])   . Hypokalemia   . Acute encephalopathy   . Anxiety state   . High cholesterol   . MI (myocardial infarction) ([place]) 1990; 1995; 1997  . OSA on CPAP   . Diabetes mellitus type 2 in obese ([place])   . History of blood transfusion     "related to OR"  . Hepatitis A   . Rheumatoid arthritis of multiple sites with negative rheumatoid factor ([place])   . Osteoarthritis   . Fibromyalgia   . Chronic lower back pain   . Depression   . CKD (chronic kidney disease), stage III   . AKI (acute kidney injury) ([place])   . Acute renal failure superimposed on stage 3  chronic kidney disease [facility])     Past Surgical History  Procedure Laterality Date  . Coronary angioplasty with stent placement      "I've got 15 stents" (07/19/2016)  . Coronary angioplasty  early 1990s  . Cardioversion N/A 06/04/2016    Procedure: CARDIOVERSION;  Surgeon: Skeet Latch, MD;  Location: [facility];  Service: Cardiovascular;  Laterality: N/A;  . Tonsillectomy and adenoidectomy  1949  . Knee arthroscopy Left 1990s  . Posterior lumbar fusion  2015    L4-5-S1  . Back surgery    . Cataract extraction w/ intraocular lens  implant, bilateral Bilateral 2000s    Family History  Problem Relation Age of Onset  . Heart failure Mother   . Hyperlipidemia Mother   . Hypertension Mother   . Heart attack Father   . Heart attack Brother     Social History:  reports that he has never smoked. He has never used smokeless tobacco. He reports that he does not drink alcohol or use illicit drugs.  Allergies:  Allergies  Allergen Reactions  . Ace Inhibitors Cough  . Atenolol Other (See Comments)    Unknown reaction  . Contrast Media [Iodinated Diagnostic Agents] Rash    Has to take benadryl prior to use  . Penicillins Hives and Rash    Has patient  had a PCN reaction causing immediate rash, facial/tongue/throat swelling, SOB or lightheadedness with hypotension: Yes Has patient had a PCN reaction causing severe rash involving mucus membranes or skin necrosis: No Has patient had a PCN reaction that required hospitalization pt was in the hospital at time of last reaction - heart attack Has patient had a PCN reaction occurring within the last 10 years: No If all of the above answers are "NO", then may proceed with Cephalosporin use.    Medications:  I have reviewed the patient's current medications. Prior to Admission:  Facility-administered medications prior to admission  Medication Dose Route Frequency Provider Last Rate Last Dose  . 0.9 %  sodium chloride infusion  250 mL  Intravenous Continuous Chilton Si, MD      . sodium chloride flush (NS) 0.9 % injection 3 mL  3 mL Intravenous Q12H Chilton Si, MD      . sodium chloride flush (NS) 0.9 % injection 3 mL  3 mL Intravenous PRN Chilton Si, MD       Prescriptions prior to admission  Medication Sig Dispense Refill Last Dose  . acetaminophen (TYLENOL) 325 MG tablet Take 650 mg by mouth every 4 (four) hours as needed for mild pain or headache.   07/19/2016 at Unknown time  . apixaban (ELIQUIS) 5 MG TABS tablet Take 1 tablet (5 mg total) by mouth 2 (two) times daily. 60 tablet  07/19/2016 at Unknown time  . carvedilol (COREG) 6.25 MG tablet Take 6.25 mg by mouth 2 (two) times daily with a meal.   07/19/2016 at Unknown time  . fenofibrate (TRICOR) 145 MG tablet Take 145 mg by mouth daily.   07/19/2016 at Unknown time  . folic acid (FOLVITE) 1 MG tablet Take 1 mg by mouth daily.   07/19/2016 at Unknown time  . furosemide (LASIX) 40 MG tablet Take 20 mg by mouth daily.    07/19/2016 at Unknown time  . gabapentin (NEURONTIN) 300 MG capsule Take 300-600 mg by mouth daily.  2 07/18/2016 at Unknown time  . hydrALAZINE (APRESOLINE) 25 MG tablet Take 12.5 mg by mouth 2 (two) times daily.    07/19/2016 at Unknown time  . insulin lispro protamine-lispro (HUMALOG 50/50 MIX) (50-50) 100 UNIT/ML SUSP injection Inject 40-50 Units into the skin 3 (three) times daily with meals. Inject 50 units subcutaneously with breakfast and 40 units with lunch & supper   07/19/2016 at Unknown time  . leucovorin (WELLCOVORIN) 10 MG tablet Take 10 mg by mouth See admin instructions. Take 1 tablet (10 mg) by mouth every 12 hours and 24 hours after the methotrexate dose   Past Week at Unknown time  . LUMIGAN 0.01 % SOLN Place 1 drop into both eyes daily.  0 07/18/2016 at Unknown time  . magnesium oxide (MAG-OX) 400 (241.3 Mg) MG tablet Take 1 tablet (400 mg total) by mouth 2 (two) times daily. (Patient taking differently: Take 1-2 tablets by mouth 2  (two) times daily. ) 60 tablet 0 07/19/2016 at Unknown time  . Methotrexate Sodium (METHOTREXATE, PF,) 50 MG/2ML injection Inject 15 mg into the muscle once a week. Inject 0.8 ml (20 mg) - on Fridays.  Must be given the same time every administration  0 Past Week at Unknown time  . Misc Natural Products (OSTEO BI-FLEX ADV DOUBLE ST PO) Take 1 tablet by mouth 2 (two) times daily.   07/19/2016 at Unknown time  . omega-3 acid ethyl esters (LOVAZA) 1 g capsule Take 1 capsule by  mouth 2 (two) times daily.  11 07/19/2016 at Unknown time  . PARoxetine (PAXIL) 40 MG tablet Take 40 mg by mouth daily.   11 07/19/2016 at Unknown time  . predniSONE (DELTASONE) 5 MG tablet Take 5 mg by mouth daily with breakfast.   07/19/2016 at Unknown time  . tamsulosin (FLOMAX) 0.4 MG CAPS capsule Take 0.4 mg by mouth daily.   4 07/19/2016 at Unknown time  . tiZANidine (ZANAFLEX) 4 MG tablet Take 1 tablet by mouth every 8 (eight) hours as needed.   2 07/19/2016 at Unknown time  . topiramate (TOPAMAX) 25 MG tablet Take 25 mg by mouth 2 (two) times daily.   07/19/2016 at Unknown time  . VITAMIN D, ERGOCALCIFEROL, PO Take 5,000 Units by mouth at bedtime.   07/18/2016 at Unknown time  . ZETIA 10 MG tablet Take 10 mg by mouth every evening.   11 07/18/2016 at Unknown time    Results for orders placed or performed during the hospital encounter of 07/19/16 (from the past 48 hour(s))  Glucose, capillary     Status: Abnormal   Collection Time: 07/19/16  5:36 PM  Result Value Ref Range   Glucose-Capillary 195 (H) 65 - 99 mg/dL  MRSA PCR Screening     Status: Abnormal   Collection Time: 07/19/16  5:57 PM  Result Value Ref Range   MRSA by PCR POSITIVE (A) NEGATIVE    Comment:        The GeneXpert MRSA Assay (FDA approved for NASAL specimens only), is one component of a comprehensive MRSA colonization surveillance program. It is not intended to diagnose MRSA infection nor to guide or monitor treatment for MRSA infections. RESULT  CALLED TO, READ BACK BY AND VERIFIED WITH: A.DUNNON,RN AT 2023 BY L.PITT 07/19/16   Troponin I (q 6hr x 3)     Status: None   Collection Time: 07/19/16  7:18 PM  Result Value Ref Range   Troponin I <0.03 <0.03 ng/mL  Comprehensive metabolic panel     Status: Abnormal   Collection Time: 07/19/16  7:18 PM  Result Value Ref Range   Sodium 138 135 - 145 mmol/L   Potassium 3.4 (L) 3.5 - 5.1 mmol/L   Chloride 100 (L) 101 - 111 mmol/L   CO2 29 22 - 32 mmol/L   Glucose, Bld 214 (H) 65 - 99 mg/dL   BUN 19 6 - 20 mg/dL   Creatinine, Ser 1.51 (H) 0.61 - 1.24 mg/dL   Calcium 9.6 8.9 - 10.3 mg/dL   Total Protein 7.3 6.5 - 8.1 g/dL   Albumin 3.5 3.5 - 5.0 g/dL   AST 23 15 - 41 U/L   ALT 15 (L) 17 - 63 U/L   Alkaline Phosphatase 45 38 - 126 U/L   Total Bilirubin 0.6 0.3 - 1.2 mg/dL   GFR calc non Af Amer 45 (L) >60 mL/min   GFR calc Af Amer 53 (L) >60 mL/min    Comment: (NOTE) The eGFR has been calculated using the CKD EPI equation. This calculation has not been validated in all clinical situations. eGFR's persistently <60 mL/min signify possible Chronic Kidney Disease.    Anion gap 9 5 - 15  Magnesium     Status: None   Collection Time: 07/19/16  7:18 PM  Result Value Ref Range   Magnesium 2.1 1.7 - 2.4 mg/dL  Phosphorus     Status: None   Collection Time: 07/19/16  7:18 PM  Result Value Ref Range  Phosphorus 3.9 2.5 - 4.6 mg/dL  CBC WITH DIFFERENTIAL     Status: Abnormal   Collection Time: 07/19/16  7:18 PM  Result Value Ref Range   WBC 11.8 (H) 4.0 - 10.5 K/uL   RBC 4.22 4.22 - 5.81 MIL/uL   Hemoglobin 13.1 13.0 - 17.0 g/dL   HCT 39.9 39.0 - 52.0 %   MCV 94.5 78.0 - 100.0 fL   MCH 31.0 26.0 - 34.0 pg   MCHC 32.8 30.0 - 36.0 g/dL   RDW 14.5 11.5 - 15.5 %   Platelets 282 150 - 400 K/uL   Neutrophils Relative % 84 %   Neutro Abs 10.0 (H) 1.7 - 7.7 K/uL   Lymphocytes Relative 11 %   Lymphs Abs 1.3 0.7 - 4.0 K/uL   Monocytes Relative 4 %   Monocytes Absolute 0.4 0.1 - 1.0  K/uL   Eosinophils Relative 1 %   Eosinophils Absolute 0.1 0.0 - 0.7 K/uL   Basophils Relative 0 %   Basophils Absolute 0.1 0.0 - 0.1 K/uL  APTT     Status: None   Collection Time: 07/19/16  7:18 PM  Result Value Ref Range   aPTT 35 24 - 37 seconds  Protime-INR     Status: Abnormal   Collection Time: 07/19/16  7:18 PM  Result Value Ref Range   Prothrombin Time 16.2 (H) 11.6 - 15.2 seconds   INR 1.29 0.00 - 1.49  TSH     Status: None   Collection Time: 07/19/16  7:18 PM  Result Value Ref Range   TSH 1.209 0.350 - 4.500 uIU/mL  Glucose, capillary     Status: Abnormal   Collection Time: 07/19/16  8:52 PM  Result Value Ref Range   Glucose-Capillary 230 (H) 65 - 99 mg/dL   Comment 1 Notify RN    Comment 2 Document in Chart   Troponin I (q 6hr x 3)     Status: None   Collection Time: 07/19/16 10:53 PM  Result Value Ref Range   Troponin I <0.03 <0.03 ng/mL  Troponin I (q 6hr x 3)     Status: Abnormal   Collection Time: 07/20/16  5:02 AM  Result Value Ref Range   Troponin I 0.03 (HH) <0.03 ng/mL    Comment: CRITICAL RESULT CALLED TO, READ BACK BY AND VERIFIED WITH: DUNDON A,RN 07/20/16 9604 [facility]   Basic metabolic panel     Status: Abnormal   Collection Time: 07/20/16  5:02 AM  Result Value Ref Range   Sodium 139 135 - 145 mmol/L   Potassium 3.2 (L) 3.5 - 5.1 mmol/L   Chloride 103 101 - 111 mmol/L   CO2 25 22 - 32 mmol/L   Glucose, Bld 160 (H) 65 - 99 mg/dL   BUN 19 6 - 20 mg/dL   Creatinine, Ser 1.39 (H) 0.61 - 1.24 mg/dL   Calcium 9.6 8.9 - 10.3 mg/dL   GFR calc non Af Amer 50 (L) >60 mL/min   GFR calc Af Amer 58 (L) >60 mL/min    Comment: (NOTE) The eGFR has been calculated using the CKD EPI equation. This calculation has not been validated in all clinical situations. eGFR's persistently <60 mL/min signify possible Chronic Kidney Disease.    Anion gap 11 5 - 15  CBC     Status: Abnormal   Collection Time: 07/20/16  5:02 AM  Result Value Ref Range   WBC 14.4 (H) 4.0  - 10.5 K/uL   RBC 4.18 (L)  4.22 - 5.81 MIL/uL   Hemoglobin 13.0 13.0 - 17.0 g/dL   HCT 39.3 39.0 - 52.0 %   MCV 94.0 78.0 - 100.0 fL   MCH 31.1 26.0 - 34.0 pg   MCHC 33.1 30.0 - 36.0 g/dL   RDW 14.4 11.5 - 15.5 %   Platelets 288 150 - 400 K/uL  Glucose, capillary     Status: Abnormal   Collection Time: 07/20/16  7:44 AM  Result Value Ref Range   Glucose-Capillary 177 (H) 65 - 99 mg/dL  Glucose, capillary     Status: Abnormal   Collection Time: 07/20/16 11:45 AM  Result Value Ref Range   Glucose-Capillary 320 (H) 65 - 99 mg/dL  Glucose, capillary     Status: Abnormal   Collection Time: 07/20/16  5:21 PM  Result Value Ref Range   Glucose-Capillary 224 (H) 65 - 99 mg/dL  Glucose, capillary     Status: Abnormal   Collection Time: 07/20/16  9:42 PM  Result Value Ref Range   Glucose-Capillary 248 (H) 65 - 99 mg/dL  Glucose, capillary     Status: Abnormal   Collection Time: 07/21/16 12:13 AM  Result Value Ref Range   Glucose-Capillary 228 (H) 65 - 99 mg/dL  Glucose, capillary     Status: Abnormal   Collection Time: 07/21/16  7:50 AM  Result Value Ref Range   Glucose-Capillary 165 (H) 65 - 99 mg/dL  CBC     Status: Abnormal   Collection Time: 07/21/16 11:08 AM  Result Value Ref Range   WBC 16.5 (H) 4.0 - 10.5 K/uL   RBC 3.90 (L) 4.22 - 5.81 MIL/uL   Hemoglobin 12.0 (L) 13.0 - 17.0 g/dL   HCT 36.3 (L) 39.0 - 52.0 %   MCV 93.1 78.0 - 100.0 fL   MCH 30.8 26.0 - 34.0 pg   MCHC 33.1 30.0 - 36.0 g/dL   RDW 14.4 11.5 - 15.5 %   Platelets 247 150 - 400 K/uL  Basic metabolic panel     Status: Abnormal   Collection Time: 07/21/16 11:08 AM  Result Value Ref Range   Sodium 132 (L) 135 - 145 mmol/L    Comment: DELTA CHECK NOTED   Potassium 4.0 3.5 - 5.1 mmol/L    Comment: DELTA CHECK NOTED   Chloride 100 (L) 101 - 111 mmol/L   CO2 26 22 - 32 mmol/L   Glucose, Bld 184 (H) 65 - 99 mg/dL   BUN 21 (H) 6 - 20 mg/dL   Creatinine, Ser 1.71 (H) 0.61 - 1.24 mg/dL   Calcium 9.1 8.9 - 10.3  mg/dL   GFR calc non Af Amer 39 (L) >60 mL/min   GFR calc Af Amer 45 (L) >60 mL/min    Comment: (NOTE) The eGFR has been calculated using the CKD EPI equation. This calculation has not been validated in all clinical situations. eGFR's persistently <60 mL/min signify possible Chronic Kidney Disease.    Anion gap 6 5 - 15  Glucose, capillary     Status: Abnormal   Collection Time: 07/21/16 11:24 AM  Result Value Ref Range   Glucose-Capillary 199 (H) 65 - 99 mg/dL    Dg Knee 1-2 Views Right  07/20/2016  CLINICAL DATA:  Acute right knee pain, swelling, TTP EXAM: RIGHT KNEE - 1-2 VIEW COMPARISON:  None. FINDINGS: Moderate degenerative osteoarthritis of the medial and patellofemoral compartments with joint space loss, sclerosis and osteophytes. Lateral compartment is spared. No acute osseous finding or fracture. Moderate joint  effusion on the lateral view superiorly. Peripheral atherosclerosis noted. IMPRESSION: Right knee osteoarthritis and associated right knee effusion. No acute osseous finding or malalignment Peripheral atherosclerosis Electronically Signed   By: Jerilynn Mages.  Shick M.D.   On: 07/20/2016 16:35    Review of Systems  Constitutional: Negative for fever and chills.  Respiratory: Negative for shortness of breath and wheezing.   Cardiovascular: Negative for palpitations.  Gastrointestinal: Negative for nausea, vomiting and abdominal pain.  Musculoskeletal:       Polyarthralgia due to RA   Neurological: Positive for sensory change (chronic ).   Blood pressure 161/79, pulse 75, temperature 98.2 F (36.8 C), temperature source Oral, resp. rate 20, height '5\' 11"'$  (1.803 m), weight 93.622 kg (206 lb 6.4 oz), SpO2 100 %. Physical Exam  Constitutional: He is oriented to person, place, and time. He is cooperative. No distress.  Musculoskeletal:  Right Lower Extremity  Inspection:    Large R knee effusion     No erythema to R knee     Ecchymosis to plantar aspect R foot 1st and 2nd MTP     Lower leg unremarkable Bony eval:    No hip or knee tenderness    Lower leg nontender    Foot and ankle nontender Soft tissue:    Large knee effusion     Ecchymosis R foot     No significant ankle swelling     No cellulitis noted  ROM:    Pt tolerates gentle knee ROM  Sensation:    DPN, SPN, TN sensation symmetric to contralateral extremity. Decreased throughout   Motor:   EHL, FHL, AT, PT, peroneals, gastroc motor grossly intact   Vascular:   Ext warm   + DP pulse  Neurological: He is alert and oriented to person, place, and time.  Psychiatric: He has a normal mood and affect. His speech is normal. Thought content normal. Cognition and memory are normal.  Nursing note reviewed.    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 Doran Durand (Foot and ankle specialist)   - Dispo:  Continue per medicine service  Follow up on labs    Jari Pigg, PA-C [facility] [phone] (P) 07/21/2016, 3:52 PM

## 2016-07-21 NOTE — Progress Notes (Signed)
Updated report received in patient's room via [facility] [place] using [employer], reviewed new ordes, VS, meds and patient's events of the day, assumed care of patient.

## 2016-07-21 NOTE — Progress Notes (Signed)
   07/21/16 2355  BiPAP/CPAP/SIPAP  BiPAP/CPAP/SIPAP Pt Type Adult  Mask Type Full face mask  Set Rate 0 breaths/min  Respiratory Rate 16 breaths/min  IPAP 13 cmH20  EPAP 13 cmH2O  Oxygen Percent 21 %  Flow Rate 0 lpm  BiPAP/CPAP/SIPAP CPAP  Patient Home Equipment No (Except for mask and tubing)  Auto Titrate No  BiPAP/CPAP /SiPAP Vitals  Pulse Rate 63  Resp 16

## 2016-07-21 NOTE — Progress Notes (Signed)
Patient ID: Julian Ross, male   DOB: 1946/09/02, 70 y.o.   MRN: [id number]  PROGRESS NOTE    Julian Ross  [id number] DOB: 02-Nov-1946 DOA: 07/19/2016  PCP: Kandice Hams, MD   Brief Narrative:  70 y.o. male with medical history significant for chronic combined CHF with last 2 D ECHO in 02/2016 with EF 30%, hypertension, DM on insulin, dyslipidemia, RA on prednisone, atrial fibrillation on apixaban who presented to [facility] with reprots of loss of consciousness on the admission. He was getting ready to go out to a restaurant with his family and everything was fine. He felt fine throughout the lunch time with no chest pain or shortness of breath until the moment he got up ready to leave and then he lost consciousness for about few seconds. He dose report having occasional dizziness and usually this is his prodromal sign and he sits down. This time he did not have prodromal symptoms and just collapsed.    ED Course: In ED, pt was hemodynamically stable. Blood work was notable for WBC count 13.8, hemoglobin 11.9, Cr 1.65 (recent baseline 2.13). CT head showed no acute intracranial findings. CXR showed mild interstitial prominence bilaterally probably chronic in nature. Right foot x ray showed displaced fracture of proximal phalanx, mild, middle toe. Erosive arthropathy and partial subluxation of first MTP joint consistent with RA.  Assessment & Plan:    Principal Problem:  Syncope and collapse / mild troponin elevation  - Cardiac enzymes WNL other than 1 set with mild elevation 0.03 - No further ischemic work up per cardio - 2 D ECHO on this admission with EF 50% - Continue Lasix 20 mg daily - TSH WNL   Active Problems:   Right knee swelling - X ray showed findings on moderate knee effusion - Appreciate ortho consult for arthrocentesis    Rheumatoid arthritis of multiple sites with negative rheumatoid factor (HCC) - Continue low dose prednisone   CAD (coronary artery disease), native  artery, no angina pectoris - Continue apixaban   Paroxysmal atrial fibrillation (HCC) - CHADS vasc score 4 - On AC with apixaban - Rate controlled with carvedilol   Chronic combined systolic and diastolic CHF (congestive heart failure) (HCC) - Last 2 D ECHO in 02/2016 with EF 30% and evidence of structural heart disease on this 2 D ECHO with global hypokinesis with severe inferior hypokinesis to akinesis - 2 D EHCO on this admission - EF 50%   CKD (chronic kidney disease) stage 3, GFR 30-59 ml/min - Baseline CR in 02/2016 was 2.13 - Creatinine within baseline range   Leukocytosis - Likely due to chronic steroids    Anemia of chronic kidney failure - Stable hemoglobin   Uncontrolled diabetes mellitus with diabetic nephropathy, with long-term current use of insulin (HCC) - A1c in 02/2016 was 9.2 indicating poor glycemic control - Continue Lantus 5 units daily, NovoLog 40 units twice daily and sliding scale insulin   Benign essential HTN - Continue carvedilol, Lasix, Hydralazine    Dyslipidemia associated with type 2 diabetes mellitus (HCC) - Continue statin therapy    DVT prophylaxis: On apixaban  Code Status: full code  Family Communication: No family at the bedside; spoke with pt son over the phone today  Disposition Plan: to SNF in next 24-48 hours    Consultants:   Cardiology  Ortho 7/22   Procedures:   2 D ECHO 07/20/2016 - EF 50%  Antimicrobials:   None    Subjective: No overnight events. Right knee started  to swell up yesterday.   Objective: Filed Vitals:   07/21/16 0936 07/21/16 0956 07/21/16 1026 07/21/16 1139  BP: 95/50 115/56 128/62 137/69  Pulse:      Temp:    98.2 F (36.8 C)  TempSrc:    Oral  Resp:      Height:      Weight:      SpO2:    100%    Intake/Output Summary (Last 24 hours) at 07/21/16 1248 Last data filed at 07/21/16 [id number]  Gross per 24 hour  Intake    600 ml  Output   1370 ml  Net   -770 ml   Filed Weights    07/19/16 1342 07/20/16 0556 07/21/16 0600  Weight: 92.987 kg (205 lb) 93.5 kg (206 lb 2.1 oz) 93.622 kg (206 lb 6.4 oz)    Examination:  General exam: Appears calm and comfortable, no acute distress  Respiratory system: Respiratory effort normal. Cardiovascular system: S1 & S2 heard, Rate controlled  Gastrointestinal system: (+) BS, no tender  Central nervous system: No focal neurological deficits. Extremities: Symmetric 5 x 5 power. Right knee swollen with no erythema but some tenderness to palpation Skin: No rashes, lesions or ulcers, warm and dry  Psychiatry: Mood & affect appropriate.   Data Reviewed: I have personally reviewed following labs and imaging studies  CBC:  Recent Labs Lab 07/19/16 1343 07/19/16 1918 07/20/16 0502 07/21/16 1108  WBC 13.8* 11.8* 14.4* 16.5*  NEUTROABS  --  10.0*  --   --   HGB 11.9* 13.1 13.0 12.0*  HCT 35.8* 39.9 39.3 36.3*  MCV 93.2 94.5 94.0 93.1  PLT 236 282 288 [id number]   Basic Metabolic Panel:  Recent Labs Lab 07/19/16 1343 07/19/16 1918 07/20/16 0502 07/21/16 1108  NA 134* 138 139 132*  K 3.8 3.4* 3.2* 4.0  CL 103 100* 103 100*  CO2 24 29 25 26   GLUCOSE 295* 214* 160* 184*  BUN 21* 19 19 21*  CREATININE 1.65* 1.51* 1.39* 1.71*  CALCIUM 9.0 9.6 9.6 9.1  MG  --  2.1  --   --   PHOS  --  3.9  --   --    GFR: Estimated Creatinine Clearance: 47.6 mL/min (by C-G formula based on Cr of 1.71). Liver Function Tests:  Recent Labs Lab 07/19/16 1343 07/19/16 1918  AST 25 23  ALT 14* 15*  ALKPHOS 44 45  BILITOT 0.6 0.6  PROT 6.2* 7.3  ALBUMIN 3.2* 3.5    Recent Labs Lab 07/19/16 1343  LIPASE 26   No results for input(s): AMMONIA in the last 168 hours. Coagulation Profile:  Recent Labs Lab 07/19/16 1918  INR 1.29   Cardiac Enzymes:  Recent Labs Lab 07/19/16 1918 07/19/16 2253 07/20/16 0502  TROPONINI <0.03 <0.03 0.03*   BNP (last 3 results) No results for input(s): PROBNP in the last 8760 hours. HbA1C: No  results for input(s): HGBA1C in the last 72 hours. CBG:  Recent Labs Lab 07/20/16 1721 07/20/16 2142 07/21/16 0013 07/21/16 0750 07/21/16 1124  GLUCAP 224* 248* 228* 165* 199*   Lipid Profile: No results for input(s): CHOL, HDL, LDLCALC, TRIG, CHOLHDL, LDLDIRECT in the last 72 hours. Thyroid Function Tests:  Recent Labs  07/19/16 1918  TSH 1.209   Anemia Panel: No results for input(s): VITAMINB12, FOLATE, FERRITIN, TIBC, IRON, RETICCTPCT in the last 72 hours. Urine analysis: No results found for: COLORURINE, APPEARANCEUR, LABSPEC, PHURINE, GLUCOSEU, HGBUR, BILIRUBINUR, KETONESUR, PROTEINUR, UROBILINOGEN, NITRITE, LEUKOCYTESUR Sepsis Labs: @LABRCNTIP (procalcitonin:4,lacticidven:4)  MRSA PCR Screening     Status: Abnormal   Collection Time: 07/19/16  5:57 PM  Result Value Ref Range Status   MRSA by PCR POSITIVE (A) NEGATIVE Final     Radiology Studies:  Dg Knee 1-2 Views Right 07/20/2016  Right knee osteoarthritis and associated right knee effusion. No acute osseous finding or malalignment Peripheral atherosclerosis   Dg Chest 2 View 07/19/2016  Mild interstitial prominence bilateral probable chronic in nature. No convincing pulmonary edema or superimposed infiltrate.   Ct Head Wo Contrast 07/19/2016  Atrophy with mild periventricular small vessel disease. No intracranial mass, hemorrhage, or extra-axial fluid collection. No acute infarct evident. 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 Extensive mucoperiosteal thickening in the left maxillary antrum. There is opacification in multiple ethmoid air cells on the left as well as in the left frontal sinus region. Much milder paranasal sinus disease is noted in an anterior right ethmoid air cell region. There are foci of arterial vascular calcification.   Dg Foot Complete Right 07/19/2016   Mildly displaced fracture of proximal phalanx of the middle toe. Chronic erosive arthropathy and partial subluxation involving first MTP joint.  This consistent with history of rheumatoid arthritis.     Scheduled Meds: . apixaban  5 mg Oral BID  . carvedilol  6.25 mg Oral BID WC  . cholecalciferol  5,000 Units Oral QHS  . fenofibrate  160 mg Oral Daily  . folic acid  1 mg Oral Daily  . furosemide  20 mg Oral Daily  . gabapentin  100 mg Oral Daily  . hydrALAZINE  12.5 mg Oral TID  . insulin aspart   40 Units Subcutaneous BID WC  . insulin glargine  5 Units Subcutaneous Daily  . leucovorin  10 mg Oral See admin instructions  . magnesium oxide  400 mg Oral BID  . methotrexate (PF)  15 mg Intramuscular Weekly  . mupirocin ointment  1 application Nasal BID  . omega-3 acid ethyl  1 capsule Oral BID  . PARoxetine  40 mg Oral Daily  . predniSONE  5 mg Oral Q breakfast  . tamsulosin  0.4 mg Oral Daily  . tiZANidine  4 mg Oral TID  . topiramate  25 mg Oral BID   Continuous Infusions:    LOS: 2 days    Time spent: 25 minutes  Greater than 50% of the time spent on counseling and coordinating the care.   Leisa Lenz, MD [facility] Pager [phone]  If 7PM-7AM, please contact night-coverage [email or web address] Password [facility] 07/21/2016, 12:48 PM

## 2016-07-21 NOTE — Progress Notes (Signed)
Physical Therapy Treatment Patient Details Name: Julian Ross MRN: [id number] DOB: 19-Jul-1946 Today's Date: 07/21/2016    History of Present Illness 70 yo male with PMH of chronic systolic and diastolic heart failure (LVEF 30-35%), paroxysmal atrial fibrillation, CAD s/p multiple PCIs who presented to the [facility] ED with syncope. Knee XRAY- knee effusion.    PT Comments    Patient's mobility limited today due to hypotension and right knee pain. Tolerated standing and able to shuffle feet taking steps along side bed but reluctant to place weight through RLE to advance LLE. Also limited by dizziness esp with changes in position. RN present and notified MD.  Supine BP 95/42 dizzy Sitting BP 79/49  dizzy Standing BP 81/50 dizzy Will progress OOB to chair later in day if BP improves. Pt not safe to return home at this time due to limited mobility s/p above. Discharge recommendation updated to SNF however if mobility progresses, pt will be safe to return home with support of wife and HHPT. Will continue to follow.  Follow Up Recommendations  SNF;Supervision/Assistance - 24 hour (pending improvement)     Equipment Recommendations  Rolling walker with 5" wheels    Recommendations for Other Services       Precautions / Restrictions Precautions Precautions: Fall Precaution Comments: watch BP Restrictions Weight Bearing Restrictions: No    Mobility  Bed Mobility Overal bed mobility: Needs Assistance Bed Mobility: Supine to Sit;Sit to Supine     Supine to sit: Min assist;HOB elevated Sit to supine: Min assist   General bed mobility comments: Assist to bring RLE to EOB and to bring RLE into bed to return to supine. Increased time and use of rail for support. + dizziness.  Transfers Overall transfer level: Needs assistance Equipment used: Rolling walker (2 wheeled) Transfers: Sit to/from Stand Sit to Stand: Mod assist;+2 physical assistance         General transfer comment:  Assist to boost up to standing. + dizziness. Heavy posterior lean when dizziness worsened.   Ambulation/Gait Ambulation/Gait assistance: Min assist Ambulation Distance (Feet): 2 Feet Assistive device: Rolling walker (2 wheeled) Gait Pattern/deviations: Shuffle;Trunk flexed;Step-to pattern;Decreased weight shift to right Gait velocity: decreased   General Gait Details: Able to shuffle feet a few steps to the left with Max A for weightshifting, pt extremely limited due to reluctant to place weight through RLE.   Stairs            Wheelchair Mobility    Modified Rankin (Stroke Patients Only)       Balance Overall balance assessment: Needs assistance Sitting-balance support: Feet supported;No upper extremity supported Sitting balance-Leahy Scale: Good     Standing balance support: During functional activity Standing balance-Leahy Scale: Poor Standing balance comment: Reliant on BUEs for support.                    Cognition Arousal/Alertness: Awake/alert Behavior During Therapy: WFL for tasks assessed/performed Overall Cognitive Status: Within Functional Limits for tasks assessed                      Exercises      General Comments        Pertinent Vitals/Pain Pain Assessment: Faces Faces Pain Scale: Hurts even more Pain Location: Right knee during standing/weightbearing Pain Descriptors / Indicators: Sharp;Sore Pain Intervention(s): Monitored during session;Repositioned;Limited activity within patient's tolerance    Home Living  Prior Function            PT Goals (current goals can now be found in the care plan section) Progress towards PT goals: Progressing toward goals (slowly due to hypotension and right knee pain)    Frequency  Min 3X/week    PT Plan Discharge plan needs to be updated    Co-evaluation             End of Session Equipment Utilized During Treatment: Gait belt Activity  Tolerance: Patient limited by pain;Treatment limited secondary to medical complications (Comment) (hypotension) Patient left: in bed;with call bell/phone within reach;with nursing/sitter in room;with SCD's reapplied     Time: 0906-0926 PT Time Calculation (min) (ACUTE ONLY): 20 min  Charges:  $Therapeutic Activity: 8-22 mins                    G Codes:  Functional Assessment Tool Used: clinical judgment Functional Limitation: Mobility: Walking and moving around Mobility: Walking and Moving Around Current Status [id number]): At least 40 percent but less than 60 percent impaired, limited or restricted Mobility: Walking and Moving Around Goal Status [phone]): At least 20 percent but less than 40 percent impaired, limited or restricted   [place] Gavitt A Codi Folkerts 07/21/2016, 9:38 AM Wray Kearns, PT, DPT [phone]

## 2016-07-21 NOTE — Progress Notes (Signed)
   Subjective: No dizziness in Bed  A little woozy yesterday   Objective: Filed Vitals:   07/21/16 0015 07/21/16 0400 07/21/16 0600 07/21/16 0753  BP: 143/62 169/77  184/82  Pulse: 58 69  75  Temp: 98.1 F (36.7 C) 99.8 F (37.7 C) 99.3 F (37.4 C) 99.2 F (37.3 C)  TempSrc: Oral Oral  Oral  Resp:    20  Height:      Weight:   206 lb 6.4 oz (93.622 kg)   SpO2: 98% 97%  97%   Weight change: 1 lb 6.4 oz (0.635 kg)  Intake/Output Summary (Last 24 hours) at 07/21/16 0859 Last data filed at 07/21/16 0400  Gross per 24 hour  Intake    360 ml  Output   1370 ml  Net  -1010 ml   Neg 2.7 L   General: Alert, awake, oriented x3, in no acute distress Neck:  JVP is normal Heart: Regular rate and rhythm, without murmurs, rubs, gallops.  Lungs: Clear to auscultation.  No rales or wheezes. Exemities:  No edema.   Neuro: Grossly intact, nonfocal.  Tele  SR   Lab Results: Results for orders placed or performed during the hospital encounter of 07/19/16 (from the past 24 hour(s))  Glucose, capillary     Status: Abnormal   Collection Time: 07/20/16 11:45 AM  Result Value Ref Range   Glucose-Capillary 320 (H) 65 - 99 mg/dL  Glucose, capillary     Status: Abnormal   Collection Time: 07/20/16  5:21 PM  Result Value Ref Range   Glucose-Capillary 224 (H) 65 - 99 mg/dL  Glucose, capillary     Status: Abnormal   Collection Time: 07/20/16  9:42 PM  Result Value Ref Range   Glucose-Capillary 248 (H) 65 - 99 mg/dL  Glucose, capillary     Status: Abnormal   Collection Time: 07/21/16 12:13 AM  Result Value Ref Range   Glucose-Capillary 228 (H) 65 - 99 mg/dL  Glucose, capillary     Status: Abnormal   Collection Time: 07/21/16  7:50 AM  Result Value Ref Range   Glucose-Capillary 165 (H) 65 - 99 mg/dL    Studies/Results: Dg Knee 1-2 Views Right  07/20/2016  CLINICAL DATA:  Acute right knee pain, swelling, TTP EXAM: RIGHT KNEE - 1-2 VIEW COMPARISON:  None. FINDINGS: Moderate degenerative  osteoarthritis of the medial and patellofemoral compartments with joint space loss, sclerosis and osteophytes. Lateral compartment is spared. No acute osseous finding or fracture. Moderate joint effusion on the lateral view superiorly. Peripheral atherosclerosis noted. IMPRESSION: Right knee osteoarthritis and associated right knee effusion. No acute osseous finding or malalignment Peripheral atherosclerosis Electronically Signed   By: Jerilynn Mages.  Shick M.D.   On: 07/20/2016 16:35    Medications: Rviewed     @PROBHOSP @  1  SYncope  I would recomm checking orthostatics today    2  PAF  Continue on coreg  On eliquis  Will need to confirm how hie is when he stands  3.  Chornic systolic CHF  Volume status is OK  Echo showes LVEF 50    4  CKD   LOS: 2 days   Dorris Carnes 07/21/2016, 8:59 AM

## 2016-07-21 NOTE — Discharge Instructions (Signed)

## 2016-07-21 NOTE — Procedures (Signed)
Clinician: Jari Pigg, PA-C  Specimen: 85 cc bloody synovial fluid  Complications: none  Intra-articular medications: none, infiltrated skin with 5cc 2% lidocaine with epi  Description  Utilizing sterile technique the superolateral aspect of the Right knee was prepped with alcohol swab, followed by betadine swabs x 3.  The SQ tissue was then infiltrated with 5 cc of 1% lidocaine with epi.  After adequate anesthesia of the skin was achieved, an 18 G needle on a 30 cc syringe was then advanced into the knee joint. A palpable pop was appreciated upon entrance to the knee.  Total of 85 cc of bloody fluid was aspirated from the joint.  Based on the clinical appearance of the fluid, a septic joint is a very low probability, did not injected any additional medications into the knee joint.  Needle removed without difficulty, dressing applied.  Pt tolerated procedure well.     Specimen will be sent to lab for gram stain, culture (aerobic and anaerobic), fluid cell count with differential and crystal analysis.  Pt can participate with therapies as he can tolerate. WBAT, no ROM restrictions.  Will follow up on labs to further guide long term treatment.   No antibiotics at this time. Will have pt follow up with foot and ankle specialist regarding R foot.  Jari Pigg, PA-C [facility] [phone] (P)

## 2016-07-21 NOTE — Procedures (Signed)
pt given BP meds this am. PT in room BPs in 90s/50s. lower than usual. orthos +. Pt states feeling dizzy upon sitting and standing. Dr. Charlies Silvers and cards paged and made aware.

## 2016-07-22 DIAGNOSIS — E1122 Type 2 diabetes mellitus with diabetic chronic kidney disease: Secondary | ICD-10-CM | POA: Diagnosis present

## 2016-07-22 DIAGNOSIS — M1711 Unilateral primary osteoarthritis, right knee: Secondary | ICD-10-CM | POA: Diagnosis present

## 2016-07-22 DIAGNOSIS — N183 Chronic kidney disease, stage 3 (moderate): Secondary | ICD-10-CM | POA: Diagnosis not present

## 2016-07-22 DIAGNOSIS — S0181XA Laceration without foreign body of other part of head, initial encounter: Secondary | ICD-10-CM | POA: Diagnosis present

## 2016-07-22 DIAGNOSIS — G4733 Obstructive sleep apnea (adult) (pediatric): Secondary | ICD-10-CM | POA: Diagnosis present

## 2016-07-22 DIAGNOSIS — I2510 Atherosclerotic heart disease of native coronary artery without angina pectoris: Secondary | ICD-10-CM | POA: Diagnosis not present

## 2016-07-22 DIAGNOSIS — Z794 Long term (current) use of insulin: Secondary | ICD-10-CM | POA: Diagnosis not present

## 2016-07-22 DIAGNOSIS — D631 Anemia in chronic kidney disease: Secondary | ICD-10-CM | POA: Diagnosis not present

## 2016-07-22 DIAGNOSIS — T380X5A Adverse effect of glucocorticoids and synthetic analogues, initial encounter: Secondary | ICD-10-CM | POA: Diagnosis present

## 2016-07-22 DIAGNOSIS — I480 Paroxysmal atrial fibrillation: Secondary | ICD-10-CM | POA: Diagnosis present

## 2016-07-22 DIAGNOSIS — S92513A Displaced fracture of proximal phalanx of unspecified lesser toe(s), initial encounter for closed fracture: Secondary | ICD-10-CM | POA: Diagnosis present

## 2016-07-22 DIAGNOSIS — D649 Anemia, unspecified: Secondary | ICD-10-CM | POA: Diagnosis present

## 2016-07-22 DIAGNOSIS — I10 Essential (primary) hypertension: Secondary | ICD-10-CM | POA: Diagnosis not present

## 2016-07-22 DIAGNOSIS — M25461 Effusion, right knee: Secondary | ICD-10-CM | POA: Diagnosis not present

## 2016-07-22 DIAGNOSIS — F411 Generalized anxiety disorder: Secondary | ICD-10-CM | POA: Diagnosis present

## 2016-07-22 DIAGNOSIS — Z7901 Long term (current) use of anticoagulants: Secondary | ICD-10-CM | POA: Diagnosis not present

## 2016-07-22 DIAGNOSIS — I252 Old myocardial infarction: Secondary | ICD-10-CM | POA: Diagnosis not present

## 2016-07-22 DIAGNOSIS — M069 Rheumatoid arthritis, unspecified: Secondary | ICD-10-CM | POA: Diagnosis present

## 2016-07-22 DIAGNOSIS — Z7952 Long term (current) use of systemic steroids: Secondary | ICD-10-CM | POA: Diagnosis not present

## 2016-07-22 DIAGNOSIS — E1121 Type 2 diabetes mellitus with diabetic nephropathy: Secondary | ICD-10-CM | POA: Diagnosis present

## 2016-07-22 DIAGNOSIS — S93141A Subluxation of metatarsophalangeal joint of right great toe, initial encounter: Secondary | ICD-10-CM | POA: Diagnosis not present

## 2016-07-22 DIAGNOSIS — W19XXXA Unspecified fall, initial encounter: Secondary | ICD-10-CM | POA: Diagnosis present

## 2016-07-22 DIAGNOSIS — I951 Orthostatic hypotension: Secondary | ICD-10-CM | POA: Diagnosis present

## 2016-07-22 DIAGNOSIS — I5042 Chronic combined systolic (congestive) and diastolic (congestive) heart failure: Secondary | ICD-10-CM | POA: Diagnosis not present

## 2016-07-22 DIAGNOSIS — E785 Hyperlipidemia, unspecified: Secondary | ICD-10-CM | POA: Diagnosis present

## 2016-07-22 DIAGNOSIS — R55 Syncope and collapse: Secondary | ICD-10-CM | POA: Diagnosis not present

## 2016-07-22 DIAGNOSIS — I130 Hypertensive heart and chronic kidney disease with heart failure and stage 1 through stage 4 chronic kidney disease, or unspecified chronic kidney disease: Secondary | ICD-10-CM | POA: Diagnosis present

## 2016-07-22 DIAGNOSIS — E7800 Pure hypercholesterolemia, unspecified: Secondary | ICD-10-CM | POA: Diagnosis present

## 2016-07-22 DIAGNOSIS — E1165 Type 2 diabetes mellitus with hyperglycemia: Secondary | ICD-10-CM | POA: Diagnosis present

## 2016-07-22 LAB — GLUCOSE, CAPILLARY
GLUCOSE-CAPILLARY: 117 mg/dL — AB (ref 65–99)
GLUCOSE-CAPILLARY: 244 mg/dL — AB (ref 65–99)
Glucose-Capillary: 177 mg/dL — ABNORMAL HIGH (ref 65–99)
Glucose-Capillary: 161 mg/dL — ABNORMAL HIGH (ref 65–99)

## 2016-07-22 LAB — CBC
HEMATOCRIT: 37 % — AB (ref 39.0–52.0)
HEMOGLOBIN: 12.3 g/dL — AB (ref 13.0–17.0)
MCH: 31.1 pg (ref 26.0–34.0)
MCHC: 33.2 g/dL (ref 30.0–36.0)
MCV: 93.4 fL (ref 78.0–100.0)
Platelets: 274 10*3/uL (ref 150–400)
RBC: 3.96 MIL/uL — ABNORMAL LOW (ref 4.22–5.81)
RDW: 14.4 % (ref 11.5–15.5)
WBC: 10.5 10*3/uL (ref 4.0–10.5)

## 2016-07-22 MED ORDER — CARVEDILOL 3.125 MG PO TABS
1.5625 mg | ORAL_TABLET | Freq: Two times a day (BID) | ORAL | Status: DC
  Administered 2016-07-22 – 2016-07-23 (×2): 1.5625 mg via ORAL
  Filled 2016-07-22 (×2): qty 1

## 2016-07-22 NOTE — Progress Notes (Signed)
Subjective: Dizzy yestreday with standing  Now in bed not dizzy  No CP  No SOB   Objective: Vitals:   07/21/16 2355 07/22/16 0000 07/22/16 0500 07/22/16 0751  BP:  (!) 108/47 (!) 152/83   Pulse: 63 60 (!) 53 (!) 57  Resp: 16  20 18   Temp:  98.6 F (37 C) 99.1 F (37.3 C) 98.9 F (37.2 C)  TempSrc:  Oral  Oral  SpO2:  97% 98% 99%  Weight:   211 lb (95.7 kg)   Height:       Weight change: 4 lb 9.6 oz (2.087 kg)  Intake/Output Summary (Last 24 hours) at 07/22/16 0756 Last data filed at 07/22/16 0500  Gross per 24 hour  Intake             1750 ml  Output             1600 ml  Net              150 ml    General: Alert, awake, oriented x3, in no acute distress Neck:  JVP is normal Heart: Regular rate and rhythm, without murmurs, rubs, gallops.  Lungs: Clear to auscultation.  No rales or wheezes. Exemities:  No edema.  S/p drainage of R knee fluid   Neuro: Grossly intact, nonfocal.   Lab Results: Results for orders placed or performed during the hospital encounter of 07/19/16 (from the past 24 hour(s))  CBC     Status: Abnormal   Collection Time: 07/21/16 11:08 AM  Result Value Ref Range   WBC 16.5 (H) 4.0 - 10.5 K/uL   RBC 3.90 (L) 4.22 - 5.81 MIL/uL   Hemoglobin 12.0 (L) 13.0 - 17.0 g/dL   HCT 36.3 (L) 39.0 - 52.0 %   MCV 93.1 78.0 - 100.0 fL   MCH 30.8 26.0 - 34.0 pg   MCHC 33.1 30.0 - 36.0 g/dL   RDW 14.4 11.5 - 15.5 %   Platelets 247 150 - 400 K/uL  Basic metabolic panel     Status: Abnormal   Collection Time: 07/21/16 11:08 AM  Result Value Ref Range   Sodium 132 (L) 135 - 145 mmol/L   Potassium 4.0 3.5 - 5.1 mmol/L   Chloride 100 (L) 101 - 111 mmol/L   CO2 26 22 - 32 mmol/L   Glucose, Bld 184 (H) 65 - 99 mg/dL   BUN 21 (H) 6 - 20 mg/dL   Creatinine, Ser 1.71 (H) 0.61 - 1.24 mg/dL   Calcium 9.1 8.9 - 10.3 mg/dL   GFR calc non Af Amer 39 (L) >60 mL/min   GFR calc Af Amer 45 (L) >60 mL/min   Anion gap 6 5 - 15  Glucose, capillary     Status: Abnormal   Collection Time: 07/21/16 11:24 AM  Result Value Ref Range   Glucose-Capillary 199 (H) 65 - 99 mg/dL  Gram stain     Status: None   Collection Time: 07/21/16  3:41 PM  Result Value Ref Range   Specimen Description FLUID SYNOVIAL RIGHT KNEE    Special Requests NONE    Gram Stain      ABUNDANT WBC PRESENT, PREDOMINANTLY PMN NO ORGANISMS SEEN    Report Status 07/21/2016 FINAL   Synovial cell count + diff, w/ crystals     Status: Abnormal   Collection Time: 07/21/16  3:50 PM  Result Value Ref Range   Color, Synovial RED (A) YELLOW   Appearance-Synovial CLOUDY (A) CLEAR  Crystals, Fluid INTRACELLULAR CALCIUM PYROPHOSPHATE CRYSTALS    WBC, Synovial 72,000 (H) 0 - 200 /cu mm   Neutrophil, Synovial 96 (H) 0 - 25 %   Lymphocytes-Synovial Fld 3 0 - 20 %   Monocyte-Macrophage-Synovial Fluid 1 (L) 50 - 90 %  Glucose, capillary     Status: Abnormal   Collection Time: 07/21/16  4:23 PM  Result Value Ref Range   Glucose-Capillary 176 (H) 65 - 99 mg/dL  Urinalysis, Routine w reflex microscopic (not at [facility])     Status: None   Collection Time: 07/21/16  5:21 PM  Result Value Ref Range   Color, Urine YELLOW YELLOW   APPearance CLEAR CLEAR   Specific Gravity, Urine 1.007 1.005 - 1.030   pH 7.0 5.0 - 8.0   Glucose, UA NEGATIVE NEGATIVE mg/dL   Hgb urine dipstick NEGATIVE NEGATIVE   Bilirubin Urine NEGATIVE NEGATIVE   Ketones, ur NEGATIVE NEGATIVE mg/dL   Protein, ur NEGATIVE NEGATIVE mg/dL   Nitrite NEGATIVE NEGATIVE   Leukocytes, UA NEGATIVE NEGATIVE  Glucose, capillary     Status: Abnormal   Collection Time: 07/21/16 10:02 PM  Result Value Ref Range   Glucose-Capillary 114 (H) 65 - 99 mg/dL  Glucose, capillary     Status: Abnormal   Collection Time: 07/22/16  7:26 AM  Result Value Ref Range   Glucose-Capillary 117 (H) 65 - 99 mg/dL    Studies/Results: No results found.  Medications:REviewed  @PROBHOSP @  1  SYncope  Ptwas orthostatic yestresay  BP is up and down  With standing  BP went to 81   I would pul back on meds for today  Need to get repeat later  This will be a problem    2.  PAF  ON coreg and Eliquis  Cut back on coreg with BP  4  Chronic systolic CHF  Echo with LVEF 50%  Volume is not bad    4  CKD    LOS: 1 day   Dorris Carnes 07/22/2016, 7:56 AM

## 2016-07-22 NOTE — Progress Notes (Signed)
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 Range 07/21/2016 15:50  Color, Synovial Latest Ref Range: YELLOW  RED (A)  Appearance-Synovial Latest Ref Range: CLEAR  CLOUDY (A)  Crystals, Fluid Unknown INTRACELLULAR CAL...  WBC, Synovial Latest Ref Range: 0 - 200 /cu mm 72,000 (H)  Neutrophil, Synovial Latest Ref Range: 0 - 25 % 96 (H)  Lymphocytes-Synovial Fld Latest Ref Range: 0 - 20 % 3  Monocyte-Macrophage-Synovial Fluid Latest Ref Range: 50 - 90 % 1 (L)    F/u on cultures when finalized  Continue with current care  WBAT R leg Call with questions  Pt can follow up with Rheumatologist and will need to f/u with Dr. Doran Durand ([facility]) regarding R foot    Jari Pigg, PA-C [facility] [phone] (P) 07/22/2016 12:36 PM

## 2016-07-22 NOTE — Progress Notes (Signed)
Patient ID: Julian Ross, male   DOB: 23-Jul-1946, 70 y.o.   MRN: [id number]  PROGRESS NOTE    Julian Ross  [id number] DOB: 04-22-1946 DOA: 07/19/2016  PCP: Kandice Hams, MD   Brief Narrative:  70 y.o. male with medical history significant for chronic combined CHF with last 2 D ECHO in 02/2016 with EF 30%, hypertension, DM on insulin, dyslipidemia, RA on prednisone, atrial fibrillation on apixaban who presented to [facility] with reprots of loss of consciousness on the admission. He was getting ready to go out to a restaurant with his family and everything was fine. He felt fine throughout the lunch time with no chest pain or shortness of breath until the moment he got up ready to leave and then he lost consciousness for about few seconds. He dose report having occasional dizziness and usually this is his prodromal sign and he sits down. This time he did not have prodromal symptoms and just collapsed.    ED Course: In ED, pt was hemodynamically stable. Blood work was notable for WBC count 13.8, hemoglobin 11.9, Cr 1.65 (recent baseline 2.13). CT head showed no acute intracranial findings. CXR showed mild interstitial prominence bilaterally probably chronic in nature. Right foot x ray showed displaced fracture of proximal phalanx, mild, middle toe. Erosive arthropathy and partial subluxation of first MTP joint consistent with RA.  Due to right knee swelling ortho consulted and they did arthrocentesis 7/22 with 85 cc fluid removed.  Assessment & Plan:    Principal Problem:  Syncope and collapse / mild troponin elevation / Orthostatic hypotension  - Cardiac enzymes WNL other than 1 set with mild elevation 0.03 - No further ischemic work up per cardio but obvious concern for fluctuating blood pressure and orthostatic signs - Appreciate cardio input - TSH WNL - 2 D ECHO on this admission with EF 50% - Continue Lasix 20 mg daily - Evaluated by PT who recommended SNF placement    Active  Problems:   Right knee swelling - X ray showed findings of moderate knee effusion - S/P arthrocentesis with 85 cc fluid removed 7/22 and sent for analysis. Per ortho did not look like septic joint  - Appreciate ortho help with this   Rheumatoid arthritis of multiple sites with negative rheumatoid factor (HCC) - Continue low dose prednisone   CAD (coronary artery disease), native artery, no angina pectoris - Continue apixaban   Paroxysmal atrial fibrillation (HCC) - CHADS vasc score 4 - On AC with apixaban - Rate controlled with carvedilol   Chronic combined systolic and diastolic CHF (congestive heart failure) (HCC)  - Last 2 D ECHO in 02/2016 with EF 30% and evidence of structural heart disease on this 2 D ECHO with global hypokinesis with severe inferior hypokinesis to akinesis - 2 D EHCO on this admission - EF 50% - Compensated    CKD (chronic kidney disease) stage 3, GFR 30-59 ml/min - Baseline CR in 02/2016 was 2.13 - Creatinine within baseline range   Leukocytosis - Likely due to chronic steroids    Anemia of chronic kidney failure - Stable hemoglobin   Uncontrolled diabetes mellitus with diabetic nephropathy, with long-term current use of insulin (HCC) - A1c in 02/2016 was 9.2 indicating poor glycemic control - Continue Lantus 5 units daily, NovoLog 40 units twice daily and sliding scale insulin - CBG's in past 24 hours: 176, 114, 117   Benign essential HTN - Continue carvedilol, Lasix, Hydralazine    Dyslipidemia associated with type 2 diabetes mellitus ([place]) -  Continue statin therapy    DVT prophylaxis: On apixaban  Code Status: full code  Family Communication: No family at the bedside; spoke with pt wife over the phone 7/23 Disposition Plan: to SNF in next 24-48 hours    Consultants:   Cardiology  Ortho 7/22   Procedures:   2 D ECHO 07/20/2016 - EF 50%  Right knee arthrocentesis 07/21/2016 with 85 cc drained and sent for analysis    Antimicrobials:   None    Subjective: No overnight events.   Objective: Vitals:   07/22/16 0000 07/22/16 0500 07/22/16 0751 07/22/16 0800  BP: (!) 108/47 (!) 152/83  (!) 169/83  Pulse: 60 (!) 53 (!) 57   Resp:  20 18   Temp: 98.6 F (37 C) 99.1 F (37.3 C) 98.9 F (37.2 C)   TempSrc: Oral  Oral   SpO2: 97% 98% 99%   Weight:  95.7 kg (211 lb)    Height:        Intake/Output Summary (Last 24 hours) at 07/22/16 1058 Last data filed at 07/22/16 0500  Gross per 24 hour  Intake             1510 ml  Output             1600 ml  Net              -90 ml   Filed Weights   07/20/16 0556 07/21/16 0600 07/22/16 0500  Weight: 93.5 kg (206 lb 2.1 oz) 93.6 kg (206 lb 6.4 oz) 95.7 kg (211 lb)    Examination:  General exam: no acute distress  Respiratory system: Respiratory effort normal. No wheezing.  Cardiovascular system: S1 & S2 heard, RRR Gastrointestinal system: (+) BS, no tenderness  Central nervous system: Non focal  Extremities: Right knee less swollen and warm to touch but no erythema, left knee without swelling Skin: warm and dry  Psychiatry: Normal mod and behavior   Data Reviewed: I have personally reviewed following labs and imaging studies  CBC:  Recent Labs Lab 07/19/16 1343 07/19/16 1918 07/20/16 0502 07/21/16 1108  WBC 13.8* 11.8* 14.4* 16.5*  NEUTROABS  --  10.0*  --   --   HGB 11.9* 13.1 13.0 12.0*  HCT 35.8* 39.9 39.3 36.3*  MCV 93.2 94.5 94.0 93.1  PLT 236 282 288 [id number]   Basic Metabolic Panel:  Recent Labs Lab 07/19/16 1343 07/19/16 1918 07/20/16 0502 07/21/16 1108  NA 134* 138 139 132*  K 3.8 3.4* 3.2* 4.0  CL 103 100* 103 100*  CO2 24 29 25 26   GLUCOSE 295* 214* 160* 184*  BUN 21* 19 19 21*  CREATININE 1.65* 1.51* 1.39* 1.71*  CALCIUM 9.0 9.6 9.6 9.1  MG  --  2.1  --   --   PHOS  --  3.9  --   --    GFR: Estimated Creatinine Clearance: 48.2 mL/min (by C-G formula based on SCr of 1.71 mg/dL). Liver Function Tests:  Recent  Labs Lab 07/19/16 1343 07/19/16 1918  AST 25 23  ALT 14* 15*  ALKPHOS 44 45  BILITOT 0.6 0.6  PROT 6.2* 7.3  ALBUMIN 3.2* 3.5    Recent Labs Lab 07/19/16 1343  LIPASE 26   No results for input(s): AMMONIA in the last 168 hours. Coagulation Profile:  Recent Labs Lab 07/19/16 1918  INR 1.29   Cardiac Enzymes:  Recent Labs Lab 07/19/16 1918 07/19/16 2253 07/20/16 0502  TROPONINI <0.03 <0.03 0.03*   BNP (  last 3 results) No results for input(s): PROBNP in the last 8760 hours. HbA1C: No results for input(s): HGBA1C in the last 72 hours. CBG:  Recent Labs Lab 07/21/16 0750 07/21/16 1124 07/21/16 1623 07/21/16 2202 07/22/16 0726  GLUCAP 165* 199* 176* 114* 117*   Lipid Profile: No results for input(s): CHOL, HDL, LDLCALC, TRIG, CHOLHDL, LDLDIRECT in the last 72 hours. Thyroid Function Tests:  Recent Labs  07/19/16 1918  TSH 1.209   Anemia Panel: No results for input(s): VITAMINB12, FOLATE, FERRITIN, TIBC, IRON, RETICCTPCT in the last 72 hours. Urine analysis:    Component Value Date/Time   COLORURINE YELLOW 07/21/2016 1721   APPEARANCEUR CLEAR 07/21/2016 1721   LABSPEC 1.007 07/21/2016 1721   PHURINE 7.0 07/21/2016 1721   GLUCOSEU NEGATIVE 07/21/2016 1721   HGBUR NEGATIVE 07/21/2016 1721   BILIRUBINUR NEGATIVE 07/21/2016 1721   KETONESUR NEGATIVE 07/21/2016 1721   PROTEINUR NEGATIVE 07/21/2016 1721   NITRITE NEGATIVE 07/21/2016 1721   LEUKOCYTESUR NEGATIVE 07/21/2016 1721   Sepsis Labs: @LABRCNTIP (procalcitonin:4,lacticidven:4)   MRSA PCR Screening     Status: Abnormal   Collection Time: 07/19/16  5:57 PM  Result Value Ref Range Status   MRSA by PCR POSITIVE (A) NEGATIVE Final     Radiology Studies:  Dg Knee 1-2 Views Right 07/20/2016  Right knee osteoarthritis and associated right knee effusion. No acute osseous finding or malalignment Peripheral atherosclerosis   Dg Chest 2 View 07/19/2016  Mild interstitial prominence bilateral  probable chronic in nature. No convincing pulmonary edema or superimposed infiltrate.   Ct Head Wo Contrast 07/19/2016  Atrophy with mild periventricular small vessel disease. No intracranial mass, hemorrhage, or extra-axial fluid collection. No acute infarct evident. 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 Extensive mucoperiosteal thickening in the left maxillary antrum. There is opacification in multiple ethmoid air cells on the left as well as in the left frontal sinus region. Much milder paranasal sinus disease is noted in an anterior right ethmoid air cell region. There are foci of arterial vascular calcification.   Dg Foot Complete Right 07/19/2016   Mildly displaced fracture of proximal phalanx of the middle toe. Chronic erosive arthropathy and partial subluxation involving first MTP joint. This consistent with history of rheumatoid arthritis.     Scheduled Meds: . apixaban  5 mg Oral BID  . carvedilol  6.25 mg Oral BID WC  . cholecalciferol  5,000 Units Oral QHS  . fenofibrate  160 mg Oral Daily  . folic acid  1 mg Oral Daily  . furosemide  20 mg Oral Daily  . gabapentin  100 mg Oral Daily  . hydrALAZINE  12.5 mg Oral TID  . insulin aspart   40 Units Subcutaneous BID WC  . insulin glargine  5 Units Subcutaneous Daily  . leucovorin  10 mg Oral See admin instructions  . magnesium oxide  400 mg Oral BID  . methotrexate (PF)  15 mg Intramuscular Weekly  . mupirocin ointment  1 application Nasal BID  . omega-3 acid ethyl  1 capsule Oral BID  . PARoxetine  40 mg Oral Daily  . predniSONE  5 mg Oral Q breakfast  . tamsulosin  0.4 mg Oral Daily  . tiZANidine  4 mg Oral TID  . topiramate  25 mg Oral BID   Continuous Infusions:    LOS: 1 day    Time spent: 25 minutes  Greater than 50% of the time spent on counseling and coordinating the care.   Leisa Lenz, MD [facility] Pager  [phone]  If 7PM-7AM, please contact night-coverage [email or web address] Password  [facility] 07/22/2016, 10:58 AM

## 2016-07-22 NOTE — Procedures (Signed)
Patient on CPAP for the night.  Patient is tolerating well at this time.

## 2016-07-23 DIAGNOSIS — E1121 Type 2 diabetes mellitus with diabetic nephropathy: Secondary | ICD-10-CM

## 2016-07-23 DIAGNOSIS — E1165 Type 2 diabetes mellitus with hyperglycemia: Secondary | ICD-10-CM

## 2016-07-23 DIAGNOSIS — I480 Paroxysmal atrial fibrillation: Secondary | ICD-10-CM

## 2016-07-23 DIAGNOSIS — Z794 Long term (current) use of insulin: Secondary | ICD-10-CM

## 2016-07-23 LAB — GLUCOSE, CAPILLARY
GLUCOSE-CAPILLARY: 85 mg/dL (ref 65–99)
Glucose-Capillary: 158 mg/dL — ABNORMAL HIGH (ref 65–99)

## 2016-07-23 MED ORDER — FUROSEMIDE 20 MG PO TABS: 20.0000 mg | ORAL_TABLET | Freq: Every day | ORAL | 0 refills | Status: DC

## 2016-07-23 MED ORDER — GABAPENTIN 100 MG PO CAPS: 100.0000 mg | ORAL_CAPSULE | Freq: Every day | ORAL | 0 refills | Status: DC

## 2016-07-23 MED ORDER — CARVEDILOL 3.125 MG PO TABS: 1.5625 mg | ORAL_TABLET | Freq: Two times a day (BID) | ORAL | 0 refills | Status: DC

## 2016-07-23 MED ORDER — HYDROCORTISONE 2.5 % RE CREA: 1.0000 "application " | TOPICAL_CREAM | Freq: Two times a day (BID) | RECTAL | 0 refills | Status: DC | PRN

## 2016-07-23 NOTE — Discharge Summary (Signed)
Physician Discharge Summary  Julian Ross [id number] DOB: July 20, 1946 DOA: 07/19/2016  PCP: Kandice Hams, MD  Admit date: 07/19/2016 Discharge date: 07/23/2016  Recommendations for Outpatient Follow-up:  Please note we changed the dose of carvedilol Also, we stopped hydralazine per cardiology recommendations Please follow up with PCP in 1-2 weeks after discharge as well as with cardiology to make sure your symptoms are controlled.  Discharge Diagnoses:  Principal Problem:   Syncope and collapse Active Problems:   Rheumatoid arthritis of multiple sites with negative rheumatoid factor (HCC)   CAD (coronary artery disease)   Paroxysmal atrial fibrillation (HCC)   Chronic combined systolic and diastolic CHF (congestive heart failure) (HCC)   CKD (chronic kidney disease) stage 3, GFR 30-59 ml/min   Leukocytosis   Anemia of chronic kidney failure   Uncontrolled diabetes mellitus with diabetic nephropathy, with long-term current use of insulin (HCC)   Benign essential HTN   Dyslipidemia associated with type 2 diabetes mellitus (HCC)   Syncope   Knee effusion    Discharge Condition: stable   Diet recommendation: as tolerated   History of present illness:  70 y.o. male with medical history significant for chronic combined CHF with last 2 D ECHO in 02/2016 with EF 30%, hypertension, DM on insulin, dyslipidemia, RA on prednisone, atrial fibrillation on apixaban who presented to [facility] with reprots of loss of consciousness on the admission. He was getting ready to go out to a restaurant with his family and everything was fine. He felt fine throughout the lunch time with no chest pain or shortness of breath until the moment he got up ready to leave and then he lost consciousness for about few seconds. He dose report having occasional dizziness and usually this is his prodromal sign and he sits down. This time he did not have prodromal symptoms and just collapsed.    ED Course: In ED, pt  was hemodynamically stable. Blood work was notable for WBC count 13.8, hemoglobin 11.9, Cr 1.65 (recent baseline 2.13). CT head showed no acute intracranial findings. CXR showed mild interstitial prominence bilaterally probably chronic in nature. Right foot x ray showed displaced fracture of proximal phalanx, mild, middle toe. Erosive arthropathy and partial subluxation of first MTP joint consistent with RA.  Due to right knee swelling ortho consulted and they did arthrocentesis 7/22 with 85 cc fluid removed.  Hospital Course:    Assessment & Plan:    Principal Problem:  Syncope and collapse / mild troponin elevation / Orthostatic hypotension  - Cardiac enzymes WNL other than 1 set with mild elevation 0.03 - No further ischemic work up per cardio  - Appreciate cardio input - TSH WNL - 2 D ECHO on this admission with EF 50% - Continue Lasix 20 mg daily, stopped hydralazine and lowered coreg dose per cardio - Evaluated by PT who recommended SNF placement but pt wants to go home so [facility] orders placed   Active Problems:   Right knee swelling - X ray showed findings of moderate knee effusion - S/P arthrocentesis with 85 cc fluid removed 7/22 and sent for analysis. Per ortho did not look like septic joint    Rheumatoid arthritis of multiple sites with negative rheumatoid factor (HCC) - Continue low dose prednisone   CAD (coronary artery disease), native artery, no angina pectoris - Continue apixaban   Paroxysmal atrial fibrillation (HCC) - CHADS vasc score 4 - On AC with apixaban - Rate controlled with carvedilol   Chronic combined systolic and diastolic CHF (  congestive heart failure) ([place])  - Last 2 D ECHO in 02/2016 with EF 30% and evidence of structural heart disease on this 2 D ECHO with global hypokinesis with severe inferior hypokinesis to akinesis - 2 D EHCO on this admission - EF 50% - Compensated    CKD (chronic kidney disease) stage 3, GFR 30-59 ml/min -  Baseline CR in 02/2016 was 2.13 - Creatinine within baseline range   Leukocytosis - Likely due to chronic steroids    Anemia of chronic kidney failure - Stable hemoglobin   Uncontrolled diabetes mellitus with diabetic nephropathy, with long-term current use of insulin (HCC) - A1c in 02/2016 was 9.2 indicating poor glycemic control - Continue home insulin tregimen   Benign essential HTN - Continue carvedilol, Lasix. Coreg dose was lowered by cardio. Hydralazine was stopped.   Dyslipidemia associated with type 2 diabetes mellitus ([place]) - Continue statin therapy    DVT prophylaxis: On apixaban  Code Status: full code  Family Communication: No family at the bedside   Consultants:   Cardiology  Ortho 7/22   Procedures:   2 D ECHO 07/20/2016 - EF 50%  Right knee arthrocentesis 07/21/2016 with 85 cc drained and sent for analysis   Antimicrobials:   None     Signed:  Leisa Lenz, MD  [facility] 07/23/2016, 11:40 AM  Pager #: [phone]  Time spent in minutes: more than 30 minutes   Discharge Exam: Vitals:   07/23/16 0035 07/23/16 0421  BP: 130/65 (!) 152/85  Pulse:  65  Resp: 16 18  Temp:  99.3 F (37.4 C)   Vitals:   07/22/16 2005 07/23/16 0009 07/23/16 0035 07/23/16 0421  BP:   130/65 (!) 152/85  Pulse: 65   65  Resp: 18  16 18   Temp: 98.6 F (37 C) 98.9 F (37.2 C)  99.3 F (37.4 C)  TempSrc: Oral   Oral  SpO2: 100%  98% 99%  Weight:    95.1 kg (209 lb 9.6 oz)  Height:        General: Pt is alert, follows commands appropriately, not in acute distress Cardiovascular: Regular rate and rhythm, S1/S2 +, no murmurs Respiratory: Clear to auscultation bilaterally, no wheezing, no crackles, no rhonchi Abdominal: Soft, non tender, non distended, bowel sounds +, no guarding Extremities: no edema, no cyanosis, pulses palpable bilaterally DP and PT Neuro: Grossly nonfocal  Discharge Instructions  Discharge Instructions     Call MD for:  difficulty breathing, headache or visual disturbances    Complete by:  As directed   Call MD for:  persistant dizziness or light-headedness    Complete by:  As directed   Call MD for:  persistant nausea and vomiting    Complete by:  As directed   Diet - low sodium heart healthy    Complete by:  As directed   Discharge instructions    Complete by:  As directed   Please note we changed the dose of carvedilol Also, we stopped hydralazine per cardiology recommendations Please follow up with PCP in 1-2 weeks after discharge as well as with cardiology to make sure your symptoms are controlled.   Increase activity slowly    Complete by:  As directed       Medication List    STOP taking these medications   hydrALAZINE 25 MG tablet Commonly known as:  APRESOLINE     TAKE these medications   acetaminophen 325 MG tablet Commonly known as:  TYLENOL Take 650 mg by  mouth every 4 (four) hours as needed for mild pain or headache.   apixaban 5 MG Tabs tablet Commonly known as:  ELIQUIS Take 1 tablet (5 mg total) by mouth 2 (two) times daily.   carvedilol 3.125 MG tablet Commonly known as:  COREG Take 0.5 tablets (1.5625 mg total) by mouth 2 (two) times daily with a meal. What changed:  medication strength  how much to take   fenofibrate 145 MG tablet Commonly known as:  TRICOR Take 145 mg by mouth daily.   folic acid 1 MG tablet Commonly known as:  FOLVITE Take 1 mg by mouth daily.   furosemide 20 MG tablet Commonly known as:  LASIX Take 1 tablet (20 mg total) by mouth daily. What changed:  medication strength   gabapentin 100 MG capsule Commonly known as:  NEURONTIN Take 1 capsule (100 mg total) by mouth daily. What changed:  Another medication with the same name was removed. Continue taking this medication, and follow the directions you see here.   hydrocortisone 2.5 % rectal cream Commonly known as:  ANUSOL-HC Place 1 application rectally 2 (two) times  daily as needed for hemorrhoids or itching.   insulin lispro protamine-lispro (50-50) 100 UNIT/ML Susp injection Commonly known as:  HUMALOG 50/50 MIX Inject 40-50 Units into the skin 3 (three) times daily with meals. Inject 50 units subcutaneously with breakfast and 40 units with lunch & supper   leucovorin 10 MG tablet Commonly known as:  WELLCOVORIN Take 10 mg by mouth See admin instructions. Take 1 tablet (10 mg) by mouth every 12 hours and 24 hours after the methotrexate dose   LUMIGAN 0.01 % Soln Generic drug:  bimatoprost Place 1 drop into both eyes daily.   magnesium oxide 400 (241.3 Mg) MG tablet Commonly known as:  MAG-OX Take 1 tablet (400 mg total) by mouth 2 (two) times daily. What changed:  how much to take   methotrexate (PF) 50 MG/2ML injection Inject 15 mg into the muscle once a week. Inject 0.8 ml (20 mg) - on Fridays.  Must be given the same time every administration   omega-3 acid ethyl esters 1 g capsule Commonly known as:  LOVAZA Take 1 capsule by mouth 2 (two) times daily.   OSTEO BI-FLEX ADV DOUBLE ST PO Take 1 tablet by mouth 2 (two) times daily.   PARoxetine 40 MG tablet Commonly known as:  PAXIL Take 40 mg by mouth daily.   predniSONE 5 MG tablet Commonly known as:  DELTASONE Take 5 mg by mouth daily with breakfast.   tamsulosin 0.4 MG Caps capsule Commonly known as:  FLOMAX Take 0.4 mg by mouth daily.   tiZANidine 4 MG tablet Commonly known as:  ZANAFLEX Take 1 tablet by mouth every 8 (eight) hours as needed.   topiramate 25 MG tablet Commonly known as:  TOPAMAX Take 25 mg by mouth 2 (two) times daily.   VITAMIN D (ERGOCALCIFEROL) PO Take 5,000 Units by mouth at bedtime.   ZETIA 10 MG tablet Generic drug:  ezetimibe Take 10 mg by mouth every evening.      Follow-up Information    Skeet Latch, MD Follow up on 07/26/2016.   Specialty:  Cardiology Why:  10:45 for your follow up Contact information: [address] [place]  [place] [address] [place] Office Follow up on 07/26/2016.   Specialty:  Cardiology Why:  12:30pm To pick up your event monitor. Contact  information: [address] [place] [phone]           The results of significant diagnostics from this hospitalization (including imaging, microbiology, ancillary and laboratory) are listed below for reference.    Significant Diagnostic Studies: Dg Chest 2 View  Result Date: 07/19/2016 CLINICAL DATA:  Syncope, fall, diabetes EXAM: CHEST  2 VIEW COMPARISON:  03/25/2016 FINDINGS: Cardiomediastinal silhouette is stable. There is mild interstitial prominence bilaterally left greater than right probable chronic in nature. No convincing pulmonary edema or superimposed infiltrate. Bony thorax is unremarkable. IMPRESSION: Mild interstitial prominence bilateral probable chronic in nature. No convincing pulmonary edema or superimposed infiltrate. Electronically Signed   By: Lahoma Crocker M.D.   On: 07/19/2016 14:39   Dg Knee 1-2 Views Right  Result Date: 07/20/2016 CLINICAL DATA:  Acute right knee pain, swelling, TTP EXAM: RIGHT KNEE - 1-2 VIEW COMPARISON:  None. FINDINGS: Moderate degenerative osteoarthritis of the medial and patellofemoral compartments with joint space loss, sclerosis and osteophytes. Lateral compartment is spared. No acute osseous finding or fracture. Moderate joint effusion on the lateral view superiorly. Peripheral atherosclerosis noted. IMPRESSION: Right knee osteoarthritis and associated right knee effusion. No acute osseous finding or malalignment Peripheral atherosclerosis Electronically Signed   By: Jerilynn Mages.  Shick M.D.   On: 07/20/2016 16:35   Ct Head Wo Contrast  Result Date: 07/19/2016 CLINICAL DATA:  Pain following fall EXAM: CT HEAD WITHOUT CONTRAST TECHNIQUE: Contiguous axial images were obtained from the base of the skull through the vertex without  intravenous contrast. COMPARISON:  March 23, 2016 FINDINGS: Brain: Moderate diffuse atrophy is stable. There is no intracranial mass, hemorrhage, extra-axial fluid collection, or midline shift. There is mild small vessel disease in the centra semiovale bilaterally. Elsewhere gray-white compartments appear normal. No acute infarct is evident. Vascular: There is calcification in the distal vertebral arteries and cavernous carotid artery regions bilaterally. There is calcification in the carotid siphon regions as well. No hyperdense vessel evident. Skull: The bony calvarium appears intact. Sinuses/Orbits: Orbits appear symmetric bilaterally. Patient appears to have had an antrostomy on the left. There is mucosal thickening throughout the left maxillary antrum. There is opacification in multiple ethmoid air cells on the left. A much lesser degree of opacification is noted in right-sided ethmoid air cells. There is opacification in a portion of the left frontal sinus. Other: Mastoid air cells are clear. IMPRESSION: Atrophy with mild periventricular small vessel disease. No intracranial mass, hemorrhage, or extra-axial fluid collection. No acute infarct evident. 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 Extensive mucoperiosteal thickening in the left maxillary antrum. There is opacification in multiple ethmoid air cells on the left as well as in the left frontal sinus region. Much milder paranasal sinus disease is noted in an anterior right ethmoid air cell region. There are foci of arterial vascular calcification. Electronically Signed   By: Lowella Grip III M.D.   On: 07/19/2016 14:31   Dg Foot Complete Right  Result Date: 07/19/2016 CLINICAL DATA:  Fall. Right foot injury and pain. Initial encounter. Rheumatoid arthritis. EXAM: RIGHT FOOT COMPLETE - 3+ VIEW COMPARISON:  None. FINDINGS: Mildly displaced oblique fracture of the proximal phalanx of the middle toe is seen. No evidence of dislocation. Chronic erosive  arthropathy is seen involving first MTP joint with partial subluxation of the proximal phalanx. Extensive peripheral vascular calcification noted. IMPRESSION: Mildly displaced fracture of proximal phalanx of the middle toe. Chronic erosive arthropathy and partial subluxation involving first MTP joint. This consistent  with history of rheumatoid arthritis. Electronically Signed   By: Earle Gell M.D.   On: 07/19/2016 15:44    Microbiology: Recent Results (from the past 240 hour(s))  MRSA PCR Screening     Status: Abnormal   Collection Time: 07/19/16  5:57 PM  Result Value Ref Range Status   MRSA by PCR POSITIVE (A) NEGATIVE Final    Comment:        The GeneXpert MRSA Assay (FDA approved for NASAL specimens only), is one component of a comprehensive MRSA colonization surveillance program. It is not intended to diagnose MRSA infection nor to guide or monitor treatment for MRSA infections. RESULT CALLED TO, READ BACK BY AND VERIFIED WITH: A.DUNNON,RN AT 2023 BY L.PITT 07/19/16   Culture, body fluid-bottle     Status: None (Preliminary result)   Collection Time: 07/21/16  3:41 PM  Result Value Ref Range Status   Specimen Description FLUID SYNOVIAL RIGHT KNEE  Final   Special Requests NONE  Final   Culture NO GROWTH 1 DAY  Final   Report Status PENDING  Incomplete  Gram stain     Status: None   Collection Time: 07/21/16  3:41 PM  Result Value Ref Range Status   Specimen Description FLUID SYNOVIAL RIGHT KNEE  Final   Special Requests NONE  Final   Gram Stain   Final    ABUNDANT WBC PRESENT, PREDOMINANTLY PMN NO ORGANISMS SEEN    Report Status 07/21/2016 FINAL  Final     Labs: Basic Metabolic Panel:  Recent Labs Lab 07/19/16 1343 07/19/16 1918 07/20/16 0502 07/21/16 1108  NA 134* 138 139 132*  K 3.8 3.4* 3.2* 4.0  CL 103 100* 103 100*  CO2 24 29 25 26   GLUCOSE 295* 214* 160* 184*  BUN 21* 19 19 21*  CREATININE 1.65* 1.51* 1.39* 1.71*  CALCIUM 9.0 9.6 9.6 9.1  MG  --   2.1  --   --   PHOS  --  3.9  --   --    Liver Function Tests:  Recent Labs Lab 07/19/16 1343 07/19/16 1918  AST 25 23  ALT 14* 15*  ALKPHOS 44 45  BILITOT 0.6 0.6  PROT 6.2* 7.3  ALBUMIN 3.2* 3.5    Recent Labs Lab 07/19/16 1343  LIPASE 26   No results for input(s): AMMONIA in the last 168 hours. CBC:  Recent Labs Lab 07/19/16 1343 07/19/16 1918 07/20/16 0502 07/21/16 1108 07/22/16 1059  WBC 13.8* 11.8* 14.4* 16.5* 10.5  NEUTROABS  --  10.0*  --   --   --   HGB 11.9* 13.1 13.0 12.0* 12.3*  HCT 35.8* 39.9 39.3 36.3* 37.0*  MCV 93.2 94.5 94.0 93.1 93.4  PLT 236 282 288 247 274   Cardiac Enzymes:  Recent Labs Lab 07/19/16 1918 07/19/16 2253 07/20/16 0502  TROPONINI <0.03 <0.03 0.03*   BNP: BNP (last 3 results)  Recent Labs  03/19/16 0010  BNP 385.9*    ProBNP (last 3 results) No results for input(s): PROBNP in the last 8760 hours.  CBG:  Recent Labs Lab 07/22/16 1138 07/22/16 1611 07/22/16 2130 07/23/16 0734 07/23/16 1110  GLUCAP 177* 244* 161* 85 158*

## 2016-07-23 NOTE — Care Management Note (Signed)
Case Management Note  Patient Details  Name: Julian Ross MRN: [id number] Date of Birth: Mar 17, 1946  Subjective/Objective: Pt admitted for syncope. Pt is from home with wife. Plan will be to d/c home today with [place].                    Action/Plan: CM did speak with pt in regards to [place]. Pt is agreeable to [employer] provided and pt chose [place] for [employer]. CM did make referral with [place]. SOC to begin within 24-48 hours post d/c. Pt has DME cane and RW at home. No further needs from CM at this time.   Expected Discharge Date:                  Expected Discharge Plan:  [place]  In-House Referral:  NA  Discharge planning Services  CM Consult  Post Acute Care Choice:  Home Health Choice offered to:  Patient  DME Arranged:  N/A DME Agency:  NA  HH Arranged:  PT [place] Agency:  [facility] ([facility]  Status of Service:  Completed, signed off  If discussed at [place] of Stay Meetings, dates discussed:    Additional Comments:  Bethena Roys, RN 07/23/2016, 1:28 PM

## 2016-07-23 NOTE — Progress Notes (Signed)
Physical Therapy Treatment Patient Details Name: Julian Ross MRN: [id number] DOB: 09/16/1946 Today's Date: 07/23/2016    History of Present Illness 70 yo male with PMH of chronic systolic and diastolic heart failure (LVEF 30-35%), paroxysmal atrial fibrillation, CAD s/p multiple PCIs who presented to the [facility] ED with syncope. Knee XRAY- knee effusion.    PT Comments    Patient with significant improvements in mobility and activity tolerance. Ambulating with use of RW with some modest deviations in gait due to pain. Overall anticipate patient will be safe for d/c home. Highly recommend HHPT upon acute discharge for safety assessment given multiple history of falls and Right sided LE deficits.   Follow Up Recommendations  Home health PT;Supervision - Intermittent     Equipment Recommendations  Rolling walker with 5" wheels    Recommendations for Other Services       Precautions / Restrictions Precautions Precautions: Fall Restrictions Weight Bearing Restrictions: No    Mobility  Bed Mobility Overal bed mobility: Modified Independent             General bed mobility comments: no physical assist required  Transfers Overall transfer level: Needs assistance Equipment used: Rolling walker (2 wheeled) Transfers: Sit to/from Stand Sit to Stand: Supervision         General transfer comment: VCs for hand placement  Ambulation/Gait Ambulation/Gait assistance: Supervision Ambulation Distance (Feet): 210 Feet Assistive device: Rolling walker (2 wheeled) Gait Pattern/deviations: Antalgic     General Gait Details: mobilizing well, steady with ambulation. modest antalgic gait secondary to foot pain   Stairs            Wheelchair Mobility    Modified Rankin (Stroke Patients Only)       Balance Overall balance assessment: Modified Independent                                  Cognition Arousal/Alertness: Awake/alert Behavior During  Therapy: WFL for tasks assessed/performed Overall Cognitive Status: Within Functional Limits for tasks assessed                      Exercises      General Comments        Pertinent Vitals/Pain Pain Assessment: Faces Faces Pain Scale: Hurts a little bit Pain Location: right foot Pain Descriptors / Indicators: Sore Pain Intervention(s): Monitored during session    Home Living                      Prior Function            PT Goals (current goals can now be found in the care plan section) Acute Rehab PT Goals Patient Stated Goal: to go home PT Goal Formulation: With patient Time For Goal Achievement: 08/03/16 Potential to Achieve Goals: Good Progress towards PT goals: Progressing toward goals    Frequency  Min 3X/week    PT Plan Discharge plan needs to be updated    Co-evaluation             End of Session Equipment Utilized During Treatment: Gait belt Activity Tolerance: Patient limited by pain;Treatment limited secondary to medical complications (Comment) (hypotension) Patient left: in bed;with call bell/phone within reach;with nursing/sitter in room;with SCD's reapplied     Time: 1112-1130 PT Time Calculation (min) (ACUTE ONLY): 18 min  Charges:  $Gait Training: 8-22 mins  G CodesDuncan Ross August 07, 2016, 4:17 PM Julian Ross, [place] DPT  [phone]

## 2016-07-23 NOTE — Progress Notes (Signed)
Patient Name: Julian Ross Date of Encounter: 07/23/2016  Hospital Problem List     Principal Problem:   Syncope and collapse Active Problems:   Rheumatoid arthritis of multiple sites with negative rheumatoid factor (HCC)   CAD (coronary artery disease)   Paroxysmal atrial fibrillation (HCC)   Chronic combined systolic and diastolic CHF (congestive heart failure) (HCC)   CKD (chronic kidney disease) stage 3, GFR 30-59 ml/min   Leukocytosis   Anemia of chronic kidney failure   Uncontrolled diabetes mellitus with diabetic nephropathy, with long-term current use of insulin (HCC)   Benign essential HTN   Dyslipidemia associated with type 2 diabetes mellitus (HCC)   Syncope   Knee effusion    Subjective   Feeling ok this morning. No dizziness, but he is laying in bed.   Inpatient Medications    . apixaban  5 mg Oral BID  . carvedilol  1.5625 mg Oral BID WC  . Chlorhexidine Gluconate Cloth  6 each Topical Q0600  . cholecalciferol  5,000 Units Oral QHS  . fenofibrate  160 mg Oral Daily  . folic acid  1 mg Oral Daily  . gabapentin  100 mg Oral Daily  . insulin aspart  0-9 Units Subcutaneous TID WC  . insulin aspart protamine- aspart  40 Units Subcutaneous BID WC  . insulin glargine  5 Units Subcutaneous Daily  . latanoprost  1 drop Both Eyes QHS  . magnesium oxide  400 mg Oral BID  . mupirocin ointment  1 application Nasal BID  . omega-3 acid ethyl esters  1 capsule Oral BID  . PARoxetine  40 mg Oral Daily  . predniSONE  5 mg Oral Q breakfast  . sodium chloride flush  3 mL Intravenous Q12H  . tamsulosin  0.4 mg Oral Daily  . tiZANidine  4 mg Oral TID  . topiramate  25 mg Oral BID    Vital Signs    Vitals:   07/22/16 2005 07/23/16 0009 07/23/16 0035 07/23/16 0421  BP:   130/65 (!) 152/85  Pulse: 65   65  Resp: 18  16 18   Temp: 98.6 F (37 C) 98.9 F (37.2 C)  99.3 F (37.4 C)  TempSrc: Oral   Oral  SpO2: 100%  98% 99%  Weight:    209 lb 9.6 oz (95.1 kg)    Height:        Intake/Output Summary (Last 24 hours) at 07/23/16 0920 Last data filed at 07/23/16 0421  Gross per 24 hour  Intake              480 ml  Output             1350 ml  Net             -870 ml   Filed Weights   07/21/16 0600 07/22/16 0500 07/23/16 0421  Weight: 206 lb 6.4 oz (93.6 kg) 211 lb (95.7 kg) 209 lb 9.6 oz (95.1 kg)    Physical Exam    General: Pleasant, NAD. Bruising to bilateral inner eyes s/p fall prior to admission.  Neuro: Alert and oriented X 3. Moves all extremities spontaneously. Psych: Normal affect. HEENT:  Normal  Neck: Supple without bruits or JVD. Lungs:  Resp regular and unlabored, CTA. Heart: RRR no s3, s4, or murmurs. Abdomen: Soft, non-tender, non-distended, BS + x 4.  Extremities: No clubbing, cyanosis or edema. DP/PT/Radials 2+ and equal bilaterally. Clean, dry bandage to the right knee.   Labs  CBC  Recent Labs  07/21/16 1108 07/22/16 1059  WBC 16.5* 10.5  HGB 12.0* 12.3*  HCT 36.3* 37.0*  MCV 93.1 93.4  PLT 247 [id number]   Basic Metabolic Panel  Recent Labs  07/21/16 1108  NA 132*  K 4.0  CL 100*  CO2 26  GLUCOSE 184*  BUN 21*  CREATININE 1.71*  CALCIUM 9.1     Telemetry    SR, intermittent junctional rhythm, with occasional PVCs  ECG    No recent  Radiology      Assessment & Plan    Julian Ross is a 70 yo male with PMH of chronic systolic and diastolic heart failure (LVEF 30-35%), paroxysmal atrial fibrillation ( on Eliquis s/p DCCV 06/04/2016), CAD s/p multiple PCIs, DM II and HTN. Admitted for syncop on 07/19/2016  1. Syncope:  Has had some orthostatic episodes during admission. This morning reports no dizziness. Blood pressure is variable. Coreg has been reduced to 1.5625mg  BID. PT saw and recommended SNF placement given his instability, and right knee pain. Last orthostatic vitals improved, could add compression stockings.  -- Event monitor ordered in the outpatient setting, and follow up Dr. [name]  scheduled. Discuss outpatient stress testing at follow up appt.   2. PAF: SR on telemetry -- Coreg was reduced, continue on Eliquis  3. Chronic systolic CHF: Last Echo with EF of 50%, does not appear volume overloaded.  4. CKD III: Monitor BMET  5. Right knee pain: s/p arthrocentesis.   Signed, Reino Bellis NP-C Pager [phone]   I have seen and examined the patient along with Reino Bellis NP-C.  I have reviewed the chart, notes and new data.  I agree with NP's note.  Key new complaints: feeling better Key examination changes: periorbital ecchymosis Key new findings / data: elevated creatinine  PLAN: Symptomatic orthostatic hypotension. Hydralazine has been stopped and carvedilol dose reduced. Change furosemide to prn. Reinforced the importance of compliance with daily weights. Try to stop tamsulosin if possible  Sanda Klein, MD, [facility] [phone] 07/23/2016, 10:06 AM

## 2016-07-26 ENCOUNTER — Ambulatory Visit (INDEPENDENT_AMBULATORY_CARE_PROVIDER_SITE_OTHER): Payer: Medicare Other | Admitting: Cardiovascular Disease

## 2016-07-26 ENCOUNTER — Encounter: Payer: Self-pay | Admitting: Cardiovascular Disease

## 2016-07-26 ENCOUNTER — Ambulatory Visit: Payer: Medicare Other

## 2016-07-26 VITALS — BP 122/68 | HR 72 | Ht 71.0 in | Wt 210.0 lb

## 2016-07-26 DIAGNOSIS — I5042 Chronic combined systolic (congestive) and diastolic (congestive) heart failure: Secondary | ICD-10-CM | POA: Diagnosis not present

## 2016-07-26 DIAGNOSIS — I480 Paroxysmal atrial fibrillation: Secondary | ICD-10-CM

## 2016-07-26 DIAGNOSIS — I10 Essential (primary) hypertension: Secondary | ICD-10-CM | POA: Diagnosis not present

## 2016-07-26 DIAGNOSIS — S92503D Displaced unspecified fracture of unspecified lesser toe(s), subsequent encounter for fracture with routine healing: Secondary | ICD-10-CM

## 2016-07-26 DIAGNOSIS — I255 Ischemic cardiomyopathy: Secondary | ICD-10-CM

## 2016-07-26 LAB — CULTURE, BODY FLUID-BOTTLE: CULTURE: NO GROWTH

## 2016-07-26 LAB — CULTURE, BODY FLUID W GRAM STAIN -BOTTLE

## 2016-07-26 NOTE — Progress Notes (Signed)
Cardiology Office Note   Date:  07/26/2016   ID:  Julian Ross, DOB April 01, 1946, MRN [id number]  PCP:  Kandice Hams, MD  Cardiologist:   Skeet Latch, MD   Chief Complaint  Patient presents with  . Follow-up    2 months  . Loss of Consciousness     History of Present Illness: Julian Ross is a 70 y.o. 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  Julian Ross was admitted to the hospital 03/18/16-04/02/16 with sepsis, hypoxic respiratory failure, metabolic encephalopathy and acute on chronic heart failure.  During that hospitalization he also had elevated troponin consistent with demand ischemia (troponin 0.13) and new onset atrial fibrillation with RVR.  He was diuresed to a discharge weight of 196 lb (from 208 lb on admit).  Julian Ross has an extensive history of CAD.  He previously had 16 stents placed in [place], [place].  His cardiologist there is [name].  The inpatient team contacted [name], who informed them that Julian Ross has a tight RCA lesion but no recent interventions.  He felt that Julian Ross could transition from Plavix to aspirin.  He has been intolerant to statins in the past.    Julian Ross underwent DCCV on 06/04/16.  After cardioversion he developed bradycardia and initially metoprolol was reduced.  Then he was switched from metoprolol to carvedilol.  This was further reduced to 6.25mg  bid on 06/19/16.  On 7/21 he had a recurrent episode of syncope. Hydralazine and isosorbide were discontinued.  Cardiac enzymes were negative.  CT of the head was negative, and chest x-ray showed no acute edema or infiltrate.  During that hospitalization he was noted to be orthostatic.  His SBP dropped to 80 when standing.  Carvedilol was reduced 1.5625 mg bid and his orthostasis was improved.  A 30 day event monitor was placed and it was recommended that outpatient Myoview be  considered.  Echo that admission showed an improvement in his LVEF to 50-55%.  Since discharge he has been feeling well and denies chest pain, shortness of breath, orthopnea, PND, lightheadedness, dizziness, or palpitations. He thinks that he has remained in sinus rhythm  Past Medical History:  Diagnosis Date  . Acute encephalopathy   . Acute hypoxemic respiratory failure ([place])   . Acute renal failure superimposed on stage 3 chronic kidney disease ([place])   . Acute respiratory failure with hypoxia ([place])   . AKI (acute kidney injury) ([place])   . Altered mental status   . Anemia   . Anxiety state   . Atrial fibrillation, new onset ([place])   . Cardiomyopathy, ischemic   . Chronic lower back pain   . Chronic systolic CHF (congestive heart failure) ([place])   . CKD (chronic kidney disease), stage III   . Demand ischemia ([place])   . Depression   . Diabetes mellitus type 2 in obese ([place])   . Edema of leg   . Elevated troponin   . Fibromyalgia   . Hepatitis A   . High cholesterol   . History of blood transfusion    "related to OR"  . Hypertension   . Hypokalemia   . MI (myocardial infarction) ([place]) 1990; 1995; 1997  . OSA on CPAP   . Osteoarthritis   . Rheumatoid arthritis of multiple sites with negative rheumatoid factor ([place])   . Septic shock [facility])     Past Surgical History:  Procedure Laterality Date  . BACK SURGERY    . CARDIOVERSION N/A 06/04/2016   Procedure: CARDIOVERSION;  Surgeon: Skeet Latch, MD;  Location: [facility];  Service: Cardiovascular;  Laterality: N/A;  . CATARACT EXTRACTION W/ INTRAOCULAR LENS  IMPLANT, BILATERAL Bilateral 2000s  . CORONARY ANGIOPLASTY  early 65s  . CORONARY ANGIOPLASTY WITH STENT PLACEMENT     "I've got 15 stents" (07/19/2016)  . KNEE ARTHROSCOPY Left 1990s  . POSTERIOR LUMBAR FUSION  2015   L4-5-S1  . right knee surgery     age of 72  . TONSILLECTOMY AND ADENOIDECTOMY  1949     Current Outpatient Prescriptions  Medication Sig Dispense  Refill  . acetaminophen (TYLENOL) 325 MG tablet Take 650 mg by mouth every 4 (four) hours as needed for mild pain or headache.    Marland Kitchen apixaban (ELIQUIS) 5 MG TABS tablet Take 1 tablet (5 mg total) by mouth 2 (two) times daily. 60 tablet   . carvedilol (COREG) 3.125 MG tablet Take 0.5 tablets (1.5625 mg total) by mouth 2 (two) times daily with a meal. 30 tablet 0  . fenofibrate (TRICOR) 145 MG tablet Take 145 mg by mouth daily.    . folic acid (FOLVITE) 1 MG tablet Take 1 mg by mouth daily.    . furosemide (LASIX) 20 MG tablet Take 1 tablet (20 mg total) by mouth daily. 30 tablet 0  . gabapentin (NEURONTIN) 100 MG capsule Take 1 capsule (100 mg total) by mouth daily. 30 capsule 0  . hydrocortisone (ANUSOL-HC) 2.5 % rectal cream Place 1 application rectally 2 (two) times daily as needed for hemorrhoids or itching. 30 g 0  . insulin lispro protamine-lispro (HUMALOG 50/50 MIX) (50-50) 100 UNIT/ML SUSP injection Inject 40-50 Units into the skin 3 (three) times daily with meals. Inject 50 units subcutaneously with breakfast and 40 units with lunch & supper    . leucovorin (WELLCOVORIN) 10 MG tablet Take 10 mg by mouth See admin instructions. Take 1 tablet (10 mg) by mouth every 12 hours and 24 hours after the methotrexate dose    . LUMIGAN 0.01 % SOLN Place 1 drop into both eyes daily.  0  . magnesium oxide (MAG-OX) 400 (241.3 Mg) MG tablet Take 1 tablet (400 mg total) by mouth 2 (two) times daily. (Patient taking differently: Take 1-2 tablets by mouth 2 (two) times daily. ) 60 tablet 0  . Methotrexate Sodium (METHOTREXATE, PF,) 50 MG/2ML injection Inject 15 mg into the muscle once a week. Inject 0.8 ml (20 mg) - on Fridays.  Must be given the same time every administration  0  . Misc Natural Products (OSTEO BI-FLEX ADV DOUBLE ST PO) Take 1 tablet by mouth 2 (two) times daily.    Marland Kitchen omega-3 acid ethyl esters (LOVAZA) 1 g capsule Take 1 capsule by mouth 2 (two) times daily.  11  . PARoxetine (PAXIL) 40 MG  tablet Take 40 mg by mouth daily.   11  . predniSONE (DELTASONE) 5 MG tablet Take 5 mg by mouth daily with breakfast.    . tamsulosin (FLOMAX) 0.4 MG CAPS capsule Take 0.4 mg by mouth daily.   4  . tiZANidine (ZANAFLEX) 4 MG tablet Take 1 tablet by mouth every 8 (eight) hours as needed.   2  . topiramate (TOPAMAX) 25 MG tablet Take 25 mg by mouth 2 (two) times daily.    Marland Kitchen VITAMIN D, ERGOCALCIFEROL, PO Take 5,000 Units by mouth at bedtime.    Marland Kitchen ZETIA 10 MG  tablet Take 10 mg by mouth every evening.   11   No current facility-administered medications for this visit.     Allergies:   Ace inhibitors; Atenolol; Contrast media [iodinated diagnostic agents]; and Penicillins    Social History:  The patient  reports that he has never smoked. He has never used smokeless tobacco. He reports that he does not drink alcohol or use drugs.   Family History:  The patient's family history includes Heart attack in his brother and father; Heart failure in his mother; Hyperlipidemia in his mother; Hypertension in his mother.    ROS:  Please see the history of present illness.   Otherwise, review of systems are positive for none.   All other systems are reviewed and negative.    PHYSICAL EXAM: VS:  BP 122/68   Pulse 72   Ht 5\' 11"  (1.803 m)   Wt 210 lb (95.3 kg)   BMI 29.29 kg/m  , BMI Body mass index is 29.29 kg/m. GENERAL:  Well appearing HEENT:  Pupils equal round and reactive, fundi not visualized, oral mucosa unremarkable NECK:  No jugular venous distention, waveform within normal limits, carotid upstroke brisk and symmetric, no bruits LYMPHATICS:  No cervical adenopathy LUNGS:  Rhonchi at right base. HEART:  RRR.  PMI not displaced or sustained,S1 and S2 within normal limits, no S3, no S4, no clicks, no rubs, no murmurs ABD:  Flat, positive bowel sounds normal in frequency in pitch, no bruits, no rebound, no guarding, no midline pulsatile mass, no hepatomegaly, no splenomegaly EXT:  2 plus pulses  throughout, no edema, no cyanosis no clubbing SKIN:  No rashes no nodules NEURO:  Cranial nerves II through XII grossly intact, motor grossly intact throughout PSYCH:  Cognitively intact, oriented to person place and time   EKG:  EKG is not ordered today. The ekg ordered 05/22/16 demonstrates atrial fibrillation rate 68 bpm.    Echo 07/20/16: Study Conclusions  - Left ventricle: The cavity size was normal. Wall thickness was   increased in a pattern of mild LVH. Systolic function was normal.   The estimated ejection fraction was in the range of 50% to 55%.   There is akinesis of the basalinferior myocardium. There is   akinesis of the inferolateral myocardium. Features are consistent   with a pseudonormal left ventricular filling pattern, with   concomitant abnormal relaxation and increased filling pressure   (grade 2 diastolic dysfunction). Doppler parameters are   consistent with high ventricular filling pressure. - Left atrium: The atrium was mildly dilated. - Right atrium: The atrium was mildly dilated.  Impressions:  - Akinesis of the basal inferior wall and inferior lateral wall;   overall low normal LV systolic function; grade 2 diastolic   dysfunction with elevated LV filling pressure; mild biatrial   enlargment; trace MR and TR.   Recent Labs: 03/19/2016: B Natriuretic Peptide 385.9 07/19/2016: ALT 15; Magnesium 2.1; TSH 1.209 07/21/2016: BUN 21; Creatinine, Ser 1.71; Potassium 4.0; Sodium 132 07/22/2016: Hemoglobin 12.3; Platelets 274    Lipid Panel    Component Value Date/Time   CHOL 123 03/22/2016 0320   TRIG 154 (H) 03/22/2016 0320   HDL 25 (L) 03/22/2016 0320   CHOLHDL 4.9 03/22/2016 0320   VLDL 31 03/22/2016 0320   LDLCALC 67 03/22/2016 0320      Wt Readings from Last 3 Encounters:  07/26/16 210 lb (95.3 kg)  07/23/16 209 lb 9.6 oz (95.1 kg)  06/19/16 212 lb (96.2 kg)  ASSESSMENT AND PLAN:  # Chronic systolic and diastolic heart failure:     Julian Ross does not have any evidence of heart failure on exam today.  His ejection fraction increased to 50-55% during his recent hospitalization.  Blood pressure has been low but is now stable.  Continue carvedilol and Lasix. Given the improvement in his EF he will not need an ICD.  # Hypertension/hypotension: BP is no longer low.  Continue carvedilol.  # Atrial fibrillation: He remains in sinus rhythm after cardioversion.  Continue Eliquis and carvedilol  This patients CHA2DS2-VASc Score and unadjusted Ischemic Stroke Rate (% per year) is equal to 4.8 % stroke rate/year from a score of 4  Above score calculated as 1 point each if present [CHF, HTN, DM, Vascular=MI/PAD/Aortic Plaque, Age if 65-74, or Male] Above score calculated as 2 points each if present [Age > 75, or Stroke/TIA/TE]  # Displaced fracture of the toe:  Julian Ross broke his toe during the syncopal event. Based on the notes it appears that he was to have had a follow-up appointment with orthopedics. He was also to be wearing a post-op shoe. We will order the shoe and ask him to stop by the orthopedic clinic next door on his way out.   Current medicines are reviewed at length with the patient today.  The patient does not have concerns regarding medicines.  The following changes have been made:  no change  Labs/ tests ordered today include:   No orders of the defined types were placed in this encounter.    Disposition:   FU with Kemaria Dedic C. [name], MD, [facility] in 3 months   This note was written with the assistance of speech recognition software.  Please excuse any transcriptional errors.  Signed, Saida Lonon C. [name], MD, [facility]  07/26/2016 11:27 AM    [place]

## 2016-07-26 NOTE — Patient Instructions (Signed)
Medication Instructions:  Your physician recommends that you continue on your current medications as directed. Please refer to the Current Medication list given to you today.  Labwork: NONE  Testing/Procedures: NONE  Follow-Up: Your physician recommends that you schedule a follow-up appointment in: 3 MONTH OV  Any Other Special Instructions Will Be Listed Below (If Applicable). RX FOR POST OP SHOE GIVEN   If you need a refill on your cardiac medications before your next appointment, please call your pharmacy.

## 2016-07-27 ENCOUNTER — Encounter: Payer: Self-pay | Admitting: Cardiovascular Disease

## 2016-07-30 DIAGNOSIS — F419 Anxiety disorder, unspecified: Secondary | ICD-10-CM | POA: Diagnosis not present

## 2016-07-30 DIAGNOSIS — N400 Enlarged prostate without lower urinary tract symptoms: Secondary | ICD-10-CM | POA: Diagnosis not present

## 2016-07-30 DIAGNOSIS — R55 Syncope and collapse: Secondary | ICD-10-CM | POA: Diagnosis not present

## 2016-07-30 DIAGNOSIS — Z794 Long term (current) use of insulin: Secondary | ICD-10-CM | POA: Diagnosis not present

## 2016-07-30 DIAGNOSIS — I2510 Atherosclerotic heart disease of native coronary artery without angina pectoris: Secondary | ICD-10-CM | POA: Diagnosis not present

## 2016-07-30 DIAGNOSIS — E1340 Other specified diabetes mellitus with diabetic neuropathy, unspecified: Secondary | ICD-10-CM | POA: Diagnosis not present

## 2016-07-30 DIAGNOSIS — M25561 Pain in right knee: Secondary | ICD-10-CM | POA: Diagnosis not present

## 2016-07-30 DIAGNOSIS — I5022 Chronic systolic (congestive) heart failure: Secondary | ICD-10-CM | POA: Diagnosis not present

## 2016-07-30 DIAGNOSIS — E1122 Type 2 diabetes mellitus with diabetic chronic kidney disease: Secondary | ICD-10-CM | POA: Diagnosis not present

## 2016-07-30 DIAGNOSIS — G4730 Sleep apnea, unspecified: Secondary | ICD-10-CM | POA: Diagnosis not present

## 2016-07-30 DIAGNOSIS — M059 Rheumatoid arthritis with rheumatoid factor, unspecified: Secondary | ICD-10-CM | POA: Diagnosis not present

## 2016-07-30 DIAGNOSIS — S92901D Unspecified fracture of right foot, subsequent encounter for fracture with routine healing: Secondary | ICD-10-CM | POA: Diagnosis not present

## 2016-07-30 DIAGNOSIS — I4891 Unspecified atrial fibrillation: Secondary | ICD-10-CM | POA: Diagnosis not present

## 2016-07-31 ENCOUNTER — Ambulatory Visit (INDEPENDENT_AMBULATORY_CARE_PROVIDER_SITE_OTHER): Payer: Medicare Other

## 2016-07-31 ENCOUNTER — Encounter: Payer: Self-pay | Admitting: *Deleted

## 2016-07-31 DIAGNOSIS — R55 Syncope and collapse: Secondary | ICD-10-CM | POA: Diagnosis not present

## 2016-07-31 NOTE — Progress Notes (Signed)
Patient showed up late for cardiac event monitor appointment. Cardiac event monitor applied to patient.

## 2016-07-31 NOTE — Progress Notes (Signed)
Patient ID: Julian Ross, male   DOB: 05-Feb-1946, 70 y.o.   MRN: [id number] Patient did not show up for 07/31/16, 11:00 AM, appointment to have a cardiac event monitor applied.

## 2016-08-01 ENCOUNTER — Telehealth: Payer: Self-pay | Admitting: Cardiovascular Disease

## 2016-08-01 NOTE — Telephone Encounter (Signed)
Received call from [facility] regarding recent recording.  Patient has recording of Afib with heart rate of 80 Patient currently taking Eliquis  Will request strips and have Dr [name] review when she is back in the office

## 2016-08-03 DIAGNOSIS — M0579 Rheumatoid arthritis with rheumatoid factor of multiple sites without organ or systems involvement: Secondary | ICD-10-CM | POA: Diagnosis not present

## 2016-08-03 DIAGNOSIS — Z79899 Other long term (current) drug therapy: Secondary | ICD-10-CM | POA: Diagnosis not present

## 2016-08-03 DIAGNOSIS — M9983 Other biomechanical lesions of lumbar region: Secondary | ICD-10-CM | POA: Diagnosis not present

## 2016-08-03 DIAGNOSIS — G629 Polyneuropathy, unspecified: Secondary | ICD-10-CM | POA: Diagnosis not present

## 2016-08-03 DIAGNOSIS — M130 Polyarthritis, unspecified: Secondary | ICD-10-CM | POA: Diagnosis not present

## 2016-08-07 DIAGNOSIS — G4733 Obstructive sleep apnea (adult) (pediatric): Secondary | ICD-10-CM | POA: Diagnosis not present

## 2016-08-07 DIAGNOSIS — J84112 Idiopathic pulmonary fibrosis: Secondary | ICD-10-CM | POA: Diagnosis not present

## 2016-08-13 DIAGNOSIS — H401112 Primary open-angle glaucoma, right eye, moderate stage: Secondary | ICD-10-CM | POA: Diagnosis not present

## 2016-08-13 DIAGNOSIS — E119 Type 2 diabetes mellitus without complications: Secondary | ICD-10-CM | POA: Diagnosis not present

## 2016-08-13 DIAGNOSIS — H401122 Primary open-angle glaucoma, left eye, moderate stage: Secondary | ICD-10-CM | POA: Diagnosis not present

## 2016-08-13 DIAGNOSIS — Z961 Presence of intraocular lens: Secondary | ICD-10-CM | POA: Diagnosis not present

## 2016-08-21 DIAGNOSIS — S23142A Subluxation of T7/T8 thoracic vertebra, initial encounter: Secondary | ICD-10-CM | POA: Diagnosis not present

## 2016-08-21 DIAGNOSIS — J019 Acute sinusitis, unspecified: Secondary | ICD-10-CM | POA: Diagnosis not present

## 2016-08-21 DIAGNOSIS — M9902 Segmental and somatic dysfunction of thoracic region: Secondary | ICD-10-CM | POA: Diagnosis not present

## 2016-08-21 DIAGNOSIS — M9904 Segmental and somatic dysfunction of sacral region: Secondary | ICD-10-CM | POA: Diagnosis not present

## 2016-08-21 DIAGNOSIS — B9689 Other specified bacterial agents as the cause of diseases classified elsewhere: Secondary | ICD-10-CM | POA: Diagnosis not present

## 2016-08-21 DIAGNOSIS — Z22322 Carrier or suspected carrier of Methicillin resistant Staphylococcus aureus: Secondary | ICD-10-CM | POA: Diagnosis not present

## 2016-08-21 DIAGNOSIS — G5761 Lesion of plantar nerve, right lower limb: Secondary | ICD-10-CM | POA: Diagnosis not present

## 2016-08-21 DIAGNOSIS — S332XXA Dislocation of sacroiliac and sacrococcygeal joint, initial encounter: Secondary | ICD-10-CM | POA: Diagnosis not present

## 2016-08-21 DIAGNOSIS — Z139 Encounter for screening, unspecified: Secondary | ICD-10-CM | POA: Diagnosis not present

## 2016-08-21 DIAGNOSIS — R05 Cough: Secondary | ICD-10-CM | POA: Diagnosis not present

## 2016-08-28 DIAGNOSIS — M9902 Segmental and somatic dysfunction of thoracic region: Secondary | ICD-10-CM | POA: Diagnosis not present

## 2016-08-28 DIAGNOSIS — S23142A Subluxation of T7/T8 thoracic vertebra, initial encounter: Secondary | ICD-10-CM | POA: Diagnosis not present

## 2016-08-28 DIAGNOSIS — S332XXA Dislocation of sacroiliac and sacrococcygeal joint, initial encounter: Secondary | ICD-10-CM | POA: Diagnosis not present

## 2016-08-28 DIAGNOSIS — M9904 Segmental and somatic dysfunction of sacral region: Secondary | ICD-10-CM | POA: Diagnosis not present

## 2016-09-09 ENCOUNTER — Other Ambulatory Visit: Payer: Self-pay | Admitting: Cardiovascular Disease

## 2016-09-10 ENCOUNTER — Other Ambulatory Visit: Payer: Self-pay | Admitting: Cardiovascular Disease

## 2016-09-10 NOTE — Telephone Encounter (Signed)
REFILL 

## 2016-09-10 NOTE — Telephone Encounter (Signed)
Review for refill. 

## 2016-09-11 NOTE — Telephone Encounter (Signed)
Review for refill. 

## 2016-09-20 DIAGNOSIS — M9902 Segmental and somatic dysfunction of thoracic region: Secondary | ICD-10-CM | POA: Diagnosis not present

## 2016-09-20 DIAGNOSIS — M9904 Segmental and somatic dysfunction of sacral region: Secondary | ICD-10-CM | POA: Diagnosis not present

## 2016-09-20 DIAGNOSIS — S23142A Subluxation of T7/T8 thoracic vertebra, initial encounter: Secondary | ICD-10-CM | POA: Diagnosis not present

## 2016-09-20 DIAGNOSIS — S332XXA Dislocation of sacroiliac and sacrococcygeal joint, initial encounter: Secondary | ICD-10-CM | POA: Diagnosis not present

## 2016-09-24 DIAGNOSIS — Z7984 Long term (current) use of oral hypoglycemic drugs: Secondary | ICD-10-CM | POA: Diagnosis not present

## 2016-09-24 DIAGNOSIS — I4891 Unspecified atrial fibrillation: Secondary | ICD-10-CM | POA: Diagnosis not present

## 2016-09-24 DIAGNOSIS — E1340 Other specified diabetes mellitus with diabetic neuropathy, unspecified: Secondary | ICD-10-CM | POA: Diagnosis not present

## 2016-09-24 DIAGNOSIS — E1122 Type 2 diabetes mellitus with diabetic chronic kidney disease: Secondary | ICD-10-CM | POA: Diagnosis not present

## 2016-09-24 DIAGNOSIS — I2510 Atherosclerotic heart disease of native coronary artery without angina pectoris: Secondary | ICD-10-CM | POA: Diagnosis not present

## 2016-09-24 DIAGNOSIS — G4730 Sleep apnea, unspecified: Secondary | ICD-10-CM | POA: Diagnosis not present

## 2016-09-24 DIAGNOSIS — I5022 Chronic systolic (congestive) heart failure: Secondary | ICD-10-CM | POA: Diagnosis not present

## 2016-09-24 DIAGNOSIS — Z23 Encounter for immunization: Secondary | ICD-10-CM | POA: Diagnosis not present

## 2016-09-24 DIAGNOSIS — Z794 Long term (current) use of insulin: Secondary | ICD-10-CM | POA: Diagnosis not present

## 2016-09-24 DIAGNOSIS — F419 Anxiety disorder, unspecified: Secondary | ICD-10-CM | POA: Diagnosis not present

## 2016-09-24 DIAGNOSIS — M059 Rheumatoid arthritis with rheumatoid factor, unspecified: Secondary | ICD-10-CM | POA: Diagnosis not present

## 2016-09-28 ENCOUNTER — Ambulatory Visit (INDEPENDENT_AMBULATORY_CARE_PROVIDER_SITE_OTHER): Payer: Medicare Other | Admitting: Cardiovascular Disease

## 2016-09-28 VITALS — BP 148/92 | HR 84 | Ht 71.0 in | Wt 217.4 lb

## 2016-09-28 DIAGNOSIS — Z955 Presence of coronary angioplasty implant and graft: Secondary | ICD-10-CM

## 2016-09-28 DIAGNOSIS — R079 Chest pain, unspecified: Secondary | ICD-10-CM

## 2016-09-28 DIAGNOSIS — I5042 Chronic combined systolic (congestive) and diastolic (congestive) heart failure: Secondary | ICD-10-CM

## 2016-09-28 DIAGNOSIS — I2583 Coronary atherosclerosis due to lipid rich plaque: Secondary | ICD-10-CM

## 2016-09-28 DIAGNOSIS — I10 Essential (primary) hypertension: Secondary | ICD-10-CM | POA: Diagnosis not present

## 2016-09-28 DIAGNOSIS — I255 Ischemic cardiomyopathy: Secondary | ICD-10-CM

## 2016-09-28 DIAGNOSIS — I480 Paroxysmal atrial fibrillation: Secondary | ICD-10-CM | POA: Diagnosis not present

## 2016-09-28 DIAGNOSIS — I209 Angina pectoris, unspecified: Secondary | ICD-10-CM

## 2016-09-28 DIAGNOSIS — I2510 Atherosclerotic heart disease of native coronary artery without angina pectoris: Secondary | ICD-10-CM

## 2016-09-28 LAB — CBC WITH DIFFERENTIAL/PLATELET
BASOS ABS: 74 {cells}/uL (ref 0–200)
Basophils Relative: 1 %
EOS ABS: 296 {cells}/uL (ref 15–500)
EOS PCT: 4 %
HCT: 38.8 % (ref 38.5–50.0)
Hemoglobin: 12.7 g/dL — ABNORMAL LOW (ref 13.2–17.1)
LYMPHS PCT: 18 %
Lymphs Abs: 1332 cells/uL (ref 850–3900)
MCH: 31 pg (ref 27.0–33.0)
MCHC: 32.7 g/dL (ref 32.0–36.0)
MCV: 94.6 fL (ref 80.0–100.0)
MONOS PCT: 10 %
MPV: 9.2 fL (ref 7.5–12.5)
Monocytes Absolute: 740 cells/uL (ref 200–950)
NEUTROS PCT: 67 %
Neutro Abs: 4958 cells/uL (ref 1500–7800)
PLATELETS: 363 10*3/uL (ref 140–400)
RBC: 4.1 MIL/uL — ABNORMAL LOW (ref 4.20–5.80)
RDW: 14.7 % (ref 11.0–15.0)
WBC: 7.4 10*3/uL (ref 3.8–10.8)

## 2016-09-28 LAB — PROTIME-INR
INR: 1.2 — ABNORMAL HIGH
PROTHROMBIN TIME: 12.4 s — AB (ref 9.0–11.5)

## 2016-09-28 LAB — APTT: aPTT: 30 s (ref 22–34)

## 2016-09-28 MED ORDER — PREDNISONE 20 MG PO TABS: ORAL_TABLET | ORAL | 0 refills | Status: DC

## 2016-09-28 NOTE — Progress Notes (Signed)
Cardiology Office Note   Date:  10/02/2016   ID:  Julian Ross, DOB 28-Jan-1946, MRN [id number]  PCP:  Kandice Hams, MD  Cardiologist:   Skeet Latch, MD   Chief Complaint  Patient presents with  . Follow-up    3 months     History of Present Illness: Julian Ross is a 70 y.o. male with chronic systolic and diastolic heart failure LVEF improved from 30-35% to 50-55%, paroxysmal atrial fibrillation, CAD s/p multiple PCIs, who presents for follow up.  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  During that hospitalization he also had elevated troponin consistent with demand ischemia (troponin 0.13) and new onset atrial fibrillation with RVR.  He was diuresed to a discharge weight of 196 lb (from 208 lb on admit).  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  He previously had 16 stents placed in [place], [place].  His cardiologist there is [name].  The inpatient team contacted [name], who informed them that Mr. Rybacki has a tight RCA lesion but no recent interventions.  He felt that Mr. Schwertner could transition from Plavix to aspirin.  He has been intolerant to statins in the past.    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  After cardioversion he developed bradycardia and initially metoprolol was reduced.  Then he was switched from metoprolol to carvedilol.  This was further reduced to 6.25mg  bid on 06/19/16.  On 7/21 he had a recurrent episode of syncope. Hydralazine and isosorbide were discontinued.  Troponin was mildly elevated at 0.03.  CT of the head was negative, and chest x-ray showed no acute edema or infiltrate.  During that hospitalization he was noted to be orthostatic.  His SBP dropped to 80 when standing.  Carvedilol was reduced 1.5625 mg bid and his orthostasis was improved.  A 30 day event monitor was placed and it was recommended that outpatient Myoview be  considered.  Echo that admission showed an improvement in his LVEF to 50-55%. He wore an event monitor 07/31/16 that revealed short runs of atrial fibrillation as well as PVCs and PACs.  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  They occurred when working around the home.  It also occurred when walking for exercise.  The chest pain lasted for a couple hours before it subsided.  There was associated shortness of breath but no nausea or diaphoresis.  It felt similar to his prior episodes of angina.  Since then he has limited his activity.  He also notes that he has been feeling more fatigued lately.  Initially he felt better after the DCCV but soon after he started feeling tired again.  He also reports episodes of lightheadedness and near syncope.  He has mild lower extremity edema that improves with compression socks.  He denies orthopnea or PND.  His blood pressure has been as low as 90/60 at times.  When this happens he feels lightheaded and had near syncope.  Past Medical History:  Diagnosis Date  . Acute encephalopathy   . Acute hypoxemic respiratory failure ([place])   . Acute renal failure superimposed on stage 3 chronic kidney disease ([place])   . Acute respiratory failure with hypoxia ([place])   . AKI (acute kidney injury) ([place])   . Altered mental status   . Anemia   . Anxiety state   .  Atrial fibrillation, new onset ([place])   . Cardiomyopathy, ischemic   . Chronic lower back pain   . Chronic systolic CHF (congestive heart failure) ([place])   . CKD (chronic kidney disease), stage III   . Demand ischemia ([place])   . Depression   . Diabetes mellitus type 2 in obese ([place])   . Edema of leg   . Elevated troponin   . Fibromyalgia   . Hepatitis A   . High cholesterol   . History of blood transfusion    "related to OR"  . Hypertension   . Hypokalemia   . MI (myocardial infarction) 1990; 1995; 1997  . OSA  on CPAP   . Osteoarthritis   . Rheumatoid arthritis of multiple sites with negative rheumatoid factor ([place])   . Septic shock [facility])     Past Surgical History:  Procedure Laterality Date  . BACK SURGERY    . CARDIOVERSION N/A 06/04/2016   Procedure: CARDIOVERSION;  Surgeon: Skeet Latch, MD;  Location: [facility];  Service: Cardiovascular;  Laterality: N/A;  . CATARACT EXTRACTION W/ INTRAOCULAR LENS  IMPLANT, BILATERAL Bilateral 2000s  . CORONARY ANGIOPLASTY  early 23s  . CORONARY ANGIOPLASTY WITH STENT PLACEMENT     "I've got 15 stents" (07/19/2016)  . KNEE ARTHROSCOPY Left 1990s  . POSTERIOR LUMBAR FUSION  2015   L4-5-S1  . right knee surgery     age of 44  . TONSILLECTOMY AND ADENOIDECTOMY  1949     Current Outpatient Prescriptions  Medication Sig Dispense Refill  . acetaminophen (TYLENOL) 325 MG tablet Take 650 mg by mouth every 4 (four) hours as needed for mild pain or headache.    . ALPRAZolam (NIRAVAM) 0.5 MG dissolvable tablet Take 1 tablet by mouth as needed.  2  . apixaban (ELIQUIS) 5 MG TABS tablet Take 1 tablet (5 mg total) by mouth 2 (two) times daily. 60 tablet   . carvedilol (COREG) 3.125 MG tablet Take 0.5 tablets (1.5625 mg total) by mouth 2 (two) times daily with a meal. 30 tablet 0  . fenofibrate (TRICOR) 145 MG tablet Take 145 mg by mouth daily.    . folic acid (FOLVITE) 1 MG tablet Take 1 mg by mouth daily.    . furosemide (LASIX) 20 MG tablet Take 1 tablet (20 mg total) by mouth daily. 30 tablet 0  . gabapentin (NEURONTIN) 300 MG capsule Take 1 capsule by mouth 2 (two) times daily.  2  . hydrocortisone (ANUSOL-HC) 2.5 % rectal cream Place 1 application rectally 2 (two) times daily as needed for hemorrhoids or itching. 30 g 0  . insulin lispro protamine-lispro (HUMALOG 50/50 MIX) (50-50) 100 UNIT/ML SUSP injection Inject 40-50 Units into the skin 3 (three) times daily with meals. Inject 50 units subcutaneously with breakfast and 40 units with lunch & supper     . leucovorin (WELLCOVORIN) 10 MG tablet Take 10 mg by mouth See admin instructions. Take 1 tablet (10 mg) by mouth every 12 hours and 24 hours after the methotrexate dose    . LUMIGAN 0.01 % SOLN Place 1 drop into both eyes daily.  0  . magnesium oxide (MAG-OX) 400 (241.3 Mg) MG tablet Take 1 tablet (400 mg total) by mouth 2 (two) times daily. (Patient taking differently: Take 1-2 tablets by mouth 2 (two) times daily. ) 60 tablet 0  . Methotrexate Sodium (METHOTREXATE, PF,) 50 MG/2ML injection Inject 15 mg into the muscle once a week. Inject 0.8 ml (20 mg) - on Fridays.  Must be given the same time every administration  0  . Misc Natural Products (OSTEO BI-FLEX ADV DOUBLE ST PO) Take 1 tablet by mouth 2 (two) times daily.    Marland Kitchen omega-3 acid ethyl esters (LOVAZA) 1 g capsule Take 1 capsule by mouth 2 (two) times daily.  11  . PARoxetine (PAXIL) 40 MG tablet Take 40 mg by mouth daily.   11  . predniSONE (DELTASONE) 5 MG tablet Take 5 mg by mouth daily with breakfast.    . tamsulosin (FLOMAX) 0.4 MG CAPS capsule Take 0.4 mg by mouth daily.   4  . tiZANidine (ZANAFLEX) 4 MG tablet Take 1 tablet by mouth every 8 (eight) hours as needed.   2  . topiramate (TOPAMAX) 25 MG tablet Take 25 mg by mouth 2 (two) times daily.    . traMADol (ULTRAM) 50 MG tablet Take 1 tablet by mouth as needed.  2  . VITAMIN D, ERGOCALCIFEROL, PO Take 5,000 Units by mouth at bedtime.    Marland Kitchen ZETIA 10 MG tablet Take 10 mg by mouth every evening.   11  . predniSONE (DELTASONE) 20 MG tablet 3 TABLETS BY MOUTH 18 HOURS PRIOR AND 2 HOURS PRIOR TO PROCEDURE 6 tablet 0   No current facility-administered medications for this visit.     Allergies:   Ace inhibitors; Atenolol; Contrast media [iodinated diagnostic agents]; and Penicillins    Social History:  The patient  reports that he has never smoked. He has never used smokeless tobacco. He reports that he does not drink alcohol or use drugs.   Family History:  The patient's family  history includes Heart attack in his brother and father; Heart failure in his mother; Hyperlipidemia in his mother; Hypertension in his mother.    ROS:  Please see the history of present illness.   Otherwise, review of systems are positive for none.   All other systems are reviewed and negative.    PHYSICAL EXAM: VS:  BP (!) 148/92   Pulse 84   Ht 5\' 11"  (1.803 m)   Wt 217 lb 6.4 oz (98.6 kg)   BMI 30.32 kg/m  , BMI Body mass index is 30.32 kg/m. GENERAL:  Well appearing HEENT:  Pupils equal round and reactive, fundi not visualized, oral mucosa unremarkable NECK:  No jugular venous distention, waveform within normal limits, carotid upstroke brisk and symmetric, no bruits LYMPHATICS:  No cervical adenopathy LUNGS:  Rhonchi at right base. HEART:  Irregularly irregular.  PMI not displaced or sustained,S1 and S2 within normal limits, no S3, no S4, no clicks, no rubs, no murmurs ABD:  Flat, positive bowel sounds normal in frequency in pitch, no bruits, no rebound, no guarding, no midline pulsatile mass, no hepatomegaly, no splenomegaly EXT:  2 plus pulses throughout, no edema, no cyanosis no clubbing SKIN:  No rashes no nodules NEURO:  Cranial nerves II through XII grossly intact, motor grossly intact throughout PSYCH:  Cognitively intact, oriented to person place and time   EKG:  EKG is ordered today. The ekg ordered 05/22/16 demonstrates atrial fibrillation rate 68 bpm.   09/28/16: Atrial fibrillation rate 84 bpm.  Non-specific ST changes.  Qt prolongation.    Echo 07/20/16: Study Conclusions  - Left ventricle: The cavity size was normal. Wall thickness was   increased in a pattern of mild LVH. Systolic function was normal.   The estimated ejection fraction was in the range of 50% to 55%.   There is akinesis of the basalinferior myocardium.  There is   akinesis of the inferolateral myocardium. Features are consistent   with a pseudonormal left ventricular filling pattern, with    concomitant abnormal relaxation and increased filling pressure   (grade 2 diastolic dysfunction). Doppler parameters are   consistent with high ventricular filling pressure. - Left atrium: The atrium was mildly dilated. - Right atrium: The atrium was mildly dilated.  Impressions:  - Akinesis of the basal inferior wall and inferior lateral wall;   overall low normal LV systolic function; grade 2 diastolic   dysfunction with elevated LV filling pressure; mild biatrial   enlargment; trace MR and TR.   Recent Labs: 03/19/2016: B Natriuretic Peptide 385.9 07/19/2016: ALT 15; Magnesium 2.1; TSH 1.209 07/21/2016: BUN 21; Creatinine, Ser 1.71; Potassium 4.0; Sodium 132 09/28/2016: Hemoglobin 12.7; Platelets 363    Lipid Panel    Component Value Date/Time   CHOL 123 03/22/2016 0320   TRIG 154 (H) 03/22/2016 0320   HDL 25 (L) 03/22/2016 0320   CHOLHDL 4.9 03/22/2016 0320   VLDL 31 03/22/2016 0320   LDLCALC 67 03/22/2016 0320      Wt Readings from Last 3 Encounters:  09/28/16 217 lb 6.4 oz (98.6 kg)  07/26/16 210 lb (95.3 kg)  07/23/16 209 lb 9.6 oz (95.1 kg)      ASSESSMENT AND PLAN:  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  He has at least 16 coronary stents. At his last heart catheterization he was told that he has a tight RCA lesion. Given that he is now having symptoms of angina with minimal exertion previously had no angina, we will proceed with cardiac catheterization, as he has a high pretest probability of disease.  He had a heart catheterization 08/09/1999 at which time he was noted to have 25% proximal to mid RCA, 100% RV branch, 95% acute marginal, less than 25% left main, 25-75% left circumflex, 50% LAD with 75% distally, 50% D1 and 25% D2 lesions.  Mr. Yeagley has chronic kidney disease as well as a contrast allergy. He will receive saline immediately prior to the procedure. He will also be pretreated with prednisone,  Benadryl, and Pepcid per protocol. He will hold his Eliquis for the 3 days preceding the procedure. During this time he will take aspirin 81 mg daily.  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  His ejection fraction increased to 50-55% during his recent hospitalization.  Blood pressure has been low but is now stable.  Continue carvedilol and Lasix.   # Hypertension/hypotension: BP is  relatively stable.  Continue carvedilol.  # Atrial fibrillation: He remains in sinus rhythm after cardioversion.  Continue Eliquis and carvedilol  This patients CHA2DS2-VASc Score and unadjusted Ischemic Stroke Rate (% per year) is equal to 4.8 % stroke rate/year from a score of 4  Above score calculated as 1 point each if present [CHF, HTN, DM, Vascular=MI/PAD/Aortic Plaque, Age if 65-74, or Male] Above score calculated as 2 points each if present [Age > 75, or Stroke/TIA/TE]    Current medicines are reviewed at length with the patient today.  The patient does not have concerns regarding medicines.  The following changes have been made:  no change  Labs/ tests ordered today include:   Orders Placed This Encounter  Procedures  . Procedural/ Surgical Case Request: Left Heart Cath and Coronary Angiography  . CBC with Differential/Platelet  . INR/PT  .  PTT  . Ambulatory referral to Cardiac Electrophysiology  . EKG 12-Lead     Disposition:   FU with Aerica Rincon C. [name], MD, [facility] in 1 month   This note was written with the assistance of speech recognition software.  Please excuse any transcriptional errors.  Signed, Plez Belton C. [name], MD, [facility]  10/02/2016 6:05 PM    [place]

## 2016-09-28 NOTE — Patient Instructions (Addendum)
Medication Instructions:  Your physician recommends that you continue on your current medications as directed. Please refer to the Current Medication list given to you today.  Labwork: PT/INR/CBC/PTT AT [facility] ON THE FIRST FLOOR  Testing/Procedures: Your physician has requested that you have a cardiac catheterization. Cardiac catheterization is used to diagnose and/or treat various heart conditions. Doctors may recommend this procedure for a number of different reasons. The most common reason is to evaluate chest pain. Chest pain can be a symptom of coronary artery disease (CAD), and cardiac catheterization can show whether plaque is narrowing or blocking your heart's arteries. This procedure is also used to evaluate the valves, as well as measure the blood flow and oxygen levels in different parts of your heart. For further information please visit [email or web address]. Please follow instruction sheet, as given. DR Tamala Julian ON 10/03/16  Follow-Up: Your physician recommends that you schedule a follow-up appointment in: [place] have been referred to [facility]   Any Other Special Instructions Will Be Listed Below (If Applicable).  You are scheduled for a cardiac catheterization on 10/03/16 with Dr. Tamala Julian or associate.  Go to [facility] 2nd Floor Short Stay on 10/03/16 at 7:00 am.  Enter thru the [facility] entrance A No food or drink after midnight night before. You may take your medications with a sip of water on the day of your procedure except Furosemide and changes below.   HOLD YOUR ELIQUIS STARTING Monday UNTIL AFTER CARDIAC CATH  START ASPIRIN 81 MG DAILY STARTING Monday WHILE YOU ARE HOLDING ELIQUIS  PREDNISONE 20 MG 3 TABLETS 18 HOURS AND 2 HOURS PRIOR TO CATH BENADRYL 25 MG 1 TABLET 12 HOURS PRIOR AND 2 HOURS PRIOR PEPCID OTC 20 MG 1 TABLET 12 HOURS PRIOR AND 2 HOURS PRIOR  ABOVE TO BE TAKEN ON THE WAY TO [place]

## 2016-10-02 ENCOUNTER — Encounter: Payer: Self-pay | Admitting: Cardiovascular Disease

## 2016-10-02 DIAGNOSIS — G729 Myopathy, unspecified: Secondary | ICD-10-CM | POA: Diagnosis not present

## 2016-10-02 DIAGNOSIS — G250 Essential tremor: Secondary | ICD-10-CM | POA: Diagnosis not present

## 2016-10-02 DIAGNOSIS — G609 Hereditary and idiopathic neuropathy, unspecified: Secondary | ICD-10-CM | POA: Diagnosis not present

## 2016-10-02 DIAGNOSIS — G8929 Other chronic pain: Secondary | ICD-10-CM | POA: Diagnosis not present

## 2016-10-02 DIAGNOSIS — E559 Vitamin D deficiency, unspecified: Secondary | ICD-10-CM | POA: Diagnosis not present

## 2016-10-02 DIAGNOSIS — R5383 Other fatigue: Secondary | ICD-10-CM | POA: Diagnosis not present

## 2016-10-03 ENCOUNTER — Ambulatory Visit (HOSPITAL_COMMUNITY)
Admission: RE | Admit: 2016-10-03 | Discharge: 2016-10-03 | Disposition: A | Discharge status: Home / Self Care | Payer: Medicare Other | Source: Ambulatory Visit | Attending: Interventional Cardiology | Admitting: Interventional Cardiology

## 2016-10-03 ENCOUNTER — Encounter (HOSPITAL_COMMUNITY)
Admission: RE | Disposition: A | Discharge status: Home / Self Care | Payer: Self-pay | Source: Ambulatory Visit | Attending: Interventional Cardiology

## 2016-10-03 DIAGNOSIS — I252 Old myocardial infarction: Secondary | ICD-10-CM | POA: Insufficient documentation

## 2016-10-03 DIAGNOSIS — E876 Hypokalemia: Secondary | ICD-10-CM | POA: Insufficient documentation

## 2016-10-03 DIAGNOSIS — I25110 Atherosclerotic heart disease of native coronary artery with unstable angina pectoris: Secondary | ICD-10-CM | POA: Insufficient documentation

## 2016-10-03 DIAGNOSIS — E669 Obesity, unspecified: Secondary | ICD-10-CM | POA: Diagnosis not present

## 2016-10-03 DIAGNOSIS — N183 Chronic kidney disease, stage 3 unspecified: Secondary | ICD-10-CM | POA: Diagnosis present

## 2016-10-03 DIAGNOSIS — Y712 Prosthetic and other implants, materials and accessory cardiovascular devices associated with adverse incidents: Secondary | ICD-10-CM | POA: Insufficient documentation

## 2016-10-03 DIAGNOSIS — Z794 Long term (current) use of insulin: Secondary | ICD-10-CM | POA: Insufficient documentation

## 2016-10-03 DIAGNOSIS — M1990 Unspecified osteoarthritis, unspecified site: Secondary | ICD-10-CM | POA: Insufficient documentation

## 2016-10-03 DIAGNOSIS — Z6830 Body mass index (BMI) 30.0-30.9, adult: Secondary | ICD-10-CM | POA: Insufficient documentation

## 2016-10-03 DIAGNOSIS — E7800 Pure hypercholesterolemia, unspecified: Secondary | ICD-10-CM | POA: Diagnosis not present

## 2016-10-03 DIAGNOSIS — I2582 Chronic total occlusion of coronary artery: Secondary | ICD-10-CM | POA: Diagnosis not present

## 2016-10-03 DIAGNOSIS — B159 Hepatitis A without hepatic coma: Secondary | ICD-10-CM | POA: Diagnosis not present

## 2016-10-03 DIAGNOSIS — I130 Hypertensive heart and chronic kidney disease with heart failure and stage 1 through stage 4 chronic kidney disease, or unspecified chronic kidney disease: Secondary | ICD-10-CM | POA: Insufficient documentation

## 2016-10-03 DIAGNOSIS — G4733 Obstructive sleep apnea (adult) (pediatric): Secondary | ICD-10-CM | POA: Diagnosis not present

## 2016-10-03 DIAGNOSIS — E1122 Type 2 diabetes mellitus with diabetic chronic kidney disease: Secondary | ICD-10-CM | POA: Diagnosis not present

## 2016-10-03 DIAGNOSIS — Z9861 Coronary angioplasty status: Secondary | ICD-10-CM

## 2016-10-03 DIAGNOSIS — T82855A Stenosis of coronary artery stent, initial encounter: Secondary | ICD-10-CM | POA: Diagnosis not present

## 2016-10-03 DIAGNOSIS — I5042 Chronic combined systolic (congestive) and diastolic (congestive) heart failure: Secondary | ICD-10-CM | POA: Diagnosis present

## 2016-10-03 DIAGNOSIS — F419 Anxiety disorder, unspecified: Secondary | ICD-10-CM | POA: Insufficient documentation

## 2016-10-03 DIAGNOSIS — I2510 Atherosclerotic heart disease of native coronary artery without angina pectoris: Secondary | ICD-10-CM | POA: Diagnosis present

## 2016-10-03 DIAGNOSIS — I200 Unstable angina: Secondary | ICD-10-CM

## 2016-10-03 DIAGNOSIS — Z880 Allergy status to penicillin: Secondary | ICD-10-CM | POA: Insufficient documentation

## 2016-10-03 DIAGNOSIS — I255 Ischemic cardiomyopathy: Secondary | ICD-10-CM | POA: Insufficient documentation

## 2016-10-03 DIAGNOSIS — D631 Anemia in chronic kidney disease: Secondary | ICD-10-CM | POA: Diagnosis not present

## 2016-10-03 DIAGNOSIS — Z7901 Long term (current) use of anticoagulants: Secondary | ICD-10-CM | POA: Diagnosis not present

## 2016-10-03 DIAGNOSIS — Z91041 Radiographic dye allergy status: Secondary | ICD-10-CM | POA: Insufficient documentation

## 2016-10-03 DIAGNOSIS — M797 Fibromyalgia: Secondary | ICD-10-CM | POA: Insufficient documentation

## 2016-10-03 DIAGNOSIS — I209 Angina pectoris, unspecified: Secondary | ICD-10-CM

## 2016-10-03 DIAGNOSIS — M545 Low back pain: Secondary | ICD-10-CM | POA: Diagnosis not present

## 2016-10-03 DIAGNOSIS — F329 Major depressive disorder, single episode, unspecified: Secondary | ICD-10-CM | POA: Diagnosis not present

## 2016-10-03 DIAGNOSIS — G8929 Other chronic pain: Secondary | ICD-10-CM | POA: Insufficient documentation

## 2016-10-03 DIAGNOSIS — Z7952 Long term (current) use of systemic steroids: Secondary | ICD-10-CM | POA: Insufficient documentation

## 2016-10-03 DIAGNOSIS — I480 Paroxysmal atrial fibrillation: Secondary | ICD-10-CM | POA: Insufficient documentation

## 2016-10-03 DIAGNOSIS — Z8249 Family history of ischemic heart disease and other diseases of the circulatory system: Secondary | ICD-10-CM | POA: Insufficient documentation

## 2016-10-03 HISTORY — PX: CARDIAC CATHETERIZATION: SHX172

## 2016-10-03 LAB — GLUCOSE, CAPILLARY
GLUCOSE-CAPILLARY: 340 mg/dL — AB (ref 65–99)
Glucose-Capillary: 356 mg/dL — ABNORMAL HIGH (ref 65–99)

## 2016-10-03 LAB — BASIC METABOLIC PANEL
ANION GAP: 11 (ref 5–15)
BUN: 25 mg/dL — ABNORMAL HIGH (ref 6–20)
CHLORIDE: 102 mmol/L (ref 101–111)
CO2: 23 mmol/L (ref 22–32)
Calcium: 9.4 mg/dL (ref 8.9–10.3)
Creatinine, Ser: 1.64 mg/dL — ABNORMAL HIGH (ref 0.61–1.24)
GFR calc non Af Amer: 41 mL/min — ABNORMAL LOW (ref >60)
GFR, EST AFRICAN AMERICAN: 47 mL/min — AB (ref >60)
GLUCOSE: 401 mg/dL — AB (ref 65–99)
Potassium: 3.6 mmol/L (ref 3.5–5.1)
Sodium: 136 mmol/L (ref 135–145)

## 2016-10-03 SURGERY — LEFT HEART CATH AND CORONARY ANGIOGRAPHY
Anesthesia: LOCAL

## 2016-10-03 MED ORDER — SODIUM CHLORIDE 0.9% FLUSH: 3.0000 mL | Freq: Two times a day (BID) | INTRAVENOUS | Status: DC

## 2016-10-03 MED ORDER — HEPARIN (PORCINE) IN NACL 2-0.9 UNIT/ML-% IJ SOLN
INTRAMUSCULAR | Status: DC | PRN
  Administered 2016-10-03: 10 mL via INTRA_ARTERIAL

## 2016-10-03 MED ORDER — IOPAMIDOL (ISOVUE-370) INJECTION 76%
INTRAVENOUS | Status: AC
  Filled 2016-10-03: qty 100

## 2016-10-03 MED ORDER — VERAPAMIL HCL 2.5 MG/ML IV SOLN
INTRAVENOUS | Status: AC
  Filled 2016-10-03: qty 2

## 2016-10-03 MED ORDER — HEPARIN SODIUM (PORCINE) 1000 UNIT/ML IJ SOLN
INTRAMUSCULAR | Status: DC | PRN
  Administered 2016-10-03: 5000 [IU] via INTRAVENOUS

## 2016-10-03 MED ORDER — SODIUM CHLORIDE 0.9 % IV SOLN: 250.0000 mL | INTRAVENOUS | Status: DC | PRN

## 2016-10-03 MED ORDER — MIDAZOLAM HCL 2 MG/2ML IJ SOLN
INTRAMUSCULAR | Status: AC
  Filled 2016-10-03: qty 2

## 2016-10-03 MED ORDER — FENTANYL CITRATE (PF) 100 MCG/2ML IJ SOLN
INTRAMUSCULAR | Status: DC | PRN
  Administered 2016-10-03: 50 ug via INTRAVENOUS

## 2016-10-03 MED ORDER — HEPARIN SODIUM (PORCINE) 1000 UNIT/ML IJ SOLN
INTRAMUSCULAR | Status: AC
  Filled 2016-10-03: qty 1

## 2016-10-03 MED ORDER — ONDANSETRON HCL 4 MG/2ML IJ SOLN: 4.0000 mg | Freq: Four times a day (QID) | INTRAMUSCULAR | Status: DC | PRN

## 2016-10-03 MED ORDER — DIPHENHYDRAMINE HCL 50 MG/ML IJ SOLN
INTRAMUSCULAR | Status: AC
  Filled 2016-10-03: qty 1

## 2016-10-03 MED ORDER — DIPHENHYDRAMINE HCL 50 MG/ML IJ SOLN
25.0000 mg | INTRAMUSCULAR | Status: AC
  Administered 2016-10-03: 25 mg via INTRAVENOUS

## 2016-10-03 MED ORDER — HEPARIN (PORCINE) IN NACL 2-0.9 UNIT/ML-% IJ SOLN
INTRAMUSCULAR | Status: DC | PRN
  Administered 2016-10-03: 1000 mL via INTRA_ARTERIAL

## 2016-10-03 MED ORDER — PREDNISONE 20 MG PO TABS: 60.0000 mg | ORAL_TABLET | ORAL | Status: DC

## 2016-10-03 MED ORDER — FAMOTIDINE 20 MG PO TABS: 20.0000 mg | ORAL_TABLET | ORAL | Status: DC

## 2016-10-03 MED ORDER — MIDAZOLAM HCL 2 MG/2ML IJ SOLN
INTRAMUSCULAR | Status: DC | PRN
  Administered 2016-10-03: 1 mg via INTRAVENOUS

## 2016-10-03 MED ORDER — LIDOCAINE HCL (PF) 1 % IJ SOLN
INTRAMUSCULAR | Status: DC | PRN
  Administered 2016-10-03: 2 mL

## 2016-10-03 MED ORDER — SODIUM CHLORIDE 0.9 % IV SOLN
INTRAVENOUS | Status: DC | PRN
  Administered 2016-10-03: 1 mL/kg/h via INTRAVENOUS

## 2016-10-03 MED ORDER — FENTANYL CITRATE (PF) 100 MCG/2ML IJ SOLN
INTRAMUSCULAR | Status: AC
  Filled 2016-10-03: qty 2

## 2016-10-03 MED ORDER — OXYCODONE-ACETAMINOPHEN 5-325 MG PO TABS: 1.0000 | ORAL_TABLET | ORAL | Status: DC | PRN

## 2016-10-03 MED ORDER — ACETAMINOPHEN 325 MG PO TABS: 650.0000 mg | ORAL_TABLET | ORAL | Status: DC | PRN

## 2016-10-03 MED ORDER — ASPIRIN 81 MG PO CHEW: 81.0000 mg | CHEWABLE_TABLET | Freq: Every day | ORAL | Status: DC

## 2016-10-03 MED ORDER — SODIUM CHLORIDE 0.9% FLUSH: 3.0000 mL | INTRAVENOUS | Status: DC | PRN

## 2016-10-03 MED ORDER — ASPIRIN 81 MG PO CHEW: 81.0000 mg | CHEWABLE_TABLET | ORAL | Status: DC

## 2016-10-03 MED ORDER — LABETALOL HCL 5 MG/ML IV SOLN: 10.0000 mg | INTRAVENOUS | Status: DC | PRN

## 2016-10-03 MED ORDER — SODIUM CHLORIDE 0.9 % IV SOLN
Freq: Once | INTRAVENOUS | Status: AC
  Administered 2016-10-03: 09:00:00 via INTRAVENOUS

## 2016-10-03 MED ORDER — FAMOTIDINE IN NACL 20-0.9 MG/50ML-% IV SOLN
INTRAVENOUS | Status: AC
  Filled 2016-10-03: qty 50

## 2016-10-03 MED ORDER — SODIUM CHLORIDE 0.9 % WEIGHT BASED INFUSION: 1.0000 mL/kg/h | INTRAVENOUS | Status: DC

## 2016-10-03 MED ORDER — LIDOCAINE HCL (PF) 1 % IJ SOLN
INTRAMUSCULAR | Status: AC
  Filled 2016-10-03: qty 30

## 2016-10-03 MED ORDER — HEPARIN (PORCINE) IN NACL 2-0.9 UNIT/ML-% IJ SOLN
INTRAMUSCULAR | Status: AC
  Filled 2016-10-03: qty 1000

## 2016-10-03 SURGICAL SUPPLY — 9 items
CATH INFINITI 5 FR JL3.5 (CATHETERS) ×2 IMPLANT
CATH INFINITI JR4 5F (CATHETERS) ×2 IMPLANT
DEVICE RAD COMP TR BAND LRG (VASCULAR PRODUCTS) ×2 IMPLANT
GLIDESHEATH SLEND A-KIT 6F 22G (SHEATH) ×2 IMPLANT
KIT HEART LEFT (KITS) ×2 IMPLANT
PACK CARDIAC CATHETERIZATION (CUSTOM PROCEDURE TRAY) ×2 IMPLANT
TRANSDUCER W/STOPCOCK (MISCELLANEOUS) ×2 IMPLANT
TUBING CIL FLEX 10 FLL-RA (TUBING) ×2 IMPLANT
WIRE SAFE-T 1.5MM-J .035X260CM (WIRE) ×2 IMPLANT

## 2016-10-03 NOTE — CV Procedure (Signed)
   Totally occluded RCA. Fills left-to-right by collaterals.  Severe diffuse disease in the LAD and circumflex.  LV gram not performed. LVEDP is elevated.  Intensify medical therapy and if no improvement in symptoms, consider coronary bypass grafting.

## 2016-10-03 NOTE — Progress Notes (Signed)
CBC 356; Eliezer Champagne, RN aware; Pt has been premedicated with Prednisone, last dose 60mg  at 0700 Rechecked at 1000, CBG 340

## 2016-10-03 NOTE — Interval H&P Note (Signed)
Cath Lab Visit (complete for each Cath Lab visit)  Clinical Evaluation Leading to the Procedure:   ACS: No.  Non-ACS:    Anginal Classification: CCS Ross  Anti-ischemic medical therapy: Minimal Therapy (1 class of medications)  Non-Invasive Test Results: No non-invasive testing performed  Prior CABG: No previous CABG      History and Physical Interval Note:  10/03/2016 9:37 AM  Julian Ross  has presented today for surgery, with the diagnosis of chest pain  The various methods of treatment have been discussed with the patient and family. After consideration of risks, benefits and other options for treatment, the patient has consented to  Procedure(s): Left Heart Cath and Coronary Angiography (N/A) as a surgical intervention .  The patient's history has been reviewed, patient examined, no change in status, stable for surgery.  I have reviewed the patient's chart and labs.  Questions were answered to the patient's satisfaction.     Julian Ross

## 2016-10-03 NOTE — H&P (View-Only) (Signed)
Cardiology Office Note   Date:  10/02/2016   ID:  Julian Ross, DOB 1946/05/26, MRN [id number]  PCP:  Kandice Hams, MD  Cardiologist:   Skeet Latch, MD   Chief Complaint  Patient presents with  . Follow-up    3 months     History of Present Illness: Bertin Inabinet is a 70 y.o. male with chronic systolic and diastolic heart failure LVEF improved from 30-35% to 50-55%, paroxysmal atrial fibrillation, CAD s/p multiple PCIs, who presents for follow up.  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  During that hospitalization he also had elevated troponin consistent with demand ischemia (troponin 0.13) and new onset atrial fibrillation with RVR.  He was diuresed to a discharge weight of 196 lb (from 208 lb on admit).  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  He previously had 16 stents placed in [place], [place].  His cardiologist there is [name].  The inpatient team contacted [name], who informed them that Mr. Pulis has a tight RCA lesion but no recent interventions.  He felt that Mr. Bentler could transition from Plavix to aspirin.  He has been intolerant to statins in the past.    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  After cardioversion he developed bradycardia and initially metoprolol was reduced.  Then he was switched from metoprolol to carvedilol.  This was further reduced to 6.25mg  bid on 06/19/16.  On 7/21 he had a recurrent episode of syncope. Hydralazine and isosorbide were discontinued.  Troponin was mildly elevated at 0.03.  CT of the head was negative, and chest x-ray showed no acute edema or infiltrate.  During that hospitalization he was noted to be orthostatic.  His SBP dropped to 80 when standing.  Carvedilol was reduced 1.5625 mg bid and his orthostasis was improved.  A 30 day event monitor was placed and it was recommended that outpatient Myoview be  considered.  Echo that admission showed an improvement in his LVEF to 50-55%. He wore an event monitor 07/31/16 that revealed short runs of atrial fibrillation as well as PVCs and PACs.  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  They occurred when working around the home.  It also occurred when walking for exercise.  The chest pain lasted for a couple hours before it subsided.  There was associated shortness of breath but no nausea or diaphoresis.  It felt similar to his prior episodes of angina.  Since then he has limited his activity.  He also notes that he has been feeling more fatigued lately.  Initially he felt better after the DCCV but soon after he started feeling tired again.  He also reports episodes of lightheadedness and near syncope.  He has mild lower extremity edema that improves with compression socks.  He denies orthopnea or PND.  His blood pressure has been as low as 90/60 at times.  When this happens he feels lightheaded and had near syncope.  Past Medical History:  Diagnosis Date  . Acute encephalopathy   . Acute hypoxemic respiratory failure ([place])   . Acute renal failure superimposed on stage 3 chronic kidney disease ([place])   . Acute respiratory failure with hypoxia ([place])   . AKI (acute kidney injury) ([place])   . Altered mental status   . Anemia   . Anxiety state   .  Atrial fibrillation, new onset ([place])   . Cardiomyopathy, ischemic   . Chronic lower back pain   . Chronic systolic CHF (congestive heart failure) ([place])   . CKD (chronic kidney disease), stage III   . Demand ischemia ([place])   . Depression   . Diabetes mellitus type 2 in obese ([place])   . Edema of leg   . Elevated troponin   . Fibromyalgia   . Hepatitis A   . High cholesterol   . History of blood transfusion    "related to OR"  . Hypertension   . Hypokalemia   . MI (myocardial infarction) 1990; 1995; 1997  . OSA  on CPAP   . Osteoarthritis   . Rheumatoid arthritis of multiple sites with negative rheumatoid factor ([place])   . Septic shock [facility])     Past Surgical History:  Procedure Laterality Date  . BACK SURGERY    . CARDIOVERSION N/A 06/04/2016   Procedure: CARDIOVERSION;  Surgeon: Skeet Latch, MD;  Location: [facility];  Service: Cardiovascular;  Laterality: N/A;  . CATARACT EXTRACTION W/ INTRAOCULAR LENS  IMPLANT, BILATERAL Bilateral 2000s  . CORONARY ANGIOPLASTY  early 28s  . CORONARY ANGIOPLASTY WITH STENT PLACEMENT     "I've got 15 stents" (07/19/2016)  . KNEE ARTHROSCOPY Left 1990s  . POSTERIOR LUMBAR FUSION  2015   L4-5-S1  . right knee surgery     age of 11  . TONSILLECTOMY AND ADENOIDECTOMY  1949     Current Outpatient Prescriptions  Medication Sig Dispense Refill  . acetaminophen (TYLENOL) 325 MG tablet Take 650 mg by mouth every 4 (four) hours as needed for mild pain or headache.    . ALPRAZolam (NIRAVAM) 0.5 MG dissolvable tablet Take 1 tablet by mouth as needed.  2  . apixaban (ELIQUIS) 5 MG TABS tablet Take 1 tablet (5 mg total) by mouth 2 (two) times daily. 60 tablet   . carvedilol (COREG) 3.125 MG tablet Take 0.5 tablets (1.5625 mg total) by mouth 2 (two) times daily with a meal. 30 tablet 0  . fenofibrate (TRICOR) 145 MG tablet Take 145 mg by mouth daily.    . folic acid (FOLVITE) 1 MG tablet Take 1 mg by mouth daily.    . furosemide (LASIX) 20 MG tablet Take 1 tablet (20 mg total) by mouth daily. 30 tablet 0  . gabapentin (NEURONTIN) 300 MG capsule Take 1 capsule by mouth 2 (two) times daily.  2  . hydrocortisone (ANUSOL-HC) 2.5 % rectal cream Place 1 application rectally 2 (two) times daily as needed for hemorrhoids or itching. 30 g 0  . insulin lispro protamine-lispro (HUMALOG 50/50 MIX) (50-50) 100 UNIT/ML SUSP injection Inject 40-50 Units into the skin 3 (three) times daily with meals. Inject 50 units subcutaneously with breakfast and 40 units with lunch & supper     . leucovorin (WELLCOVORIN) 10 MG tablet Take 10 mg by mouth See admin instructions. Take 1 tablet (10 mg) by mouth every 12 hours and 24 hours after the methotrexate dose    . LUMIGAN 0.01 % SOLN Place 1 drop into both eyes daily.  0  . magnesium oxide (MAG-OX) 400 (241.3 Mg) MG tablet Take 1 tablet (400 mg total) by mouth 2 (two) times daily. (Patient taking differently: Take 1-2 tablets by mouth 2 (two) times daily. ) 60 tablet 0  . Methotrexate Sodium (METHOTREXATE, PF,) 50 MG/2ML injection Inject 15 mg into the muscle once a week. Inject 0.8 ml (20 mg) - on Fridays.  Must be given the same time every administration  0  . Misc Natural Products (OSTEO BI-FLEX ADV DOUBLE ST PO) Take 1 tablet by mouth 2 (two) times daily.    Marland Kitchen omega-3 acid ethyl esters (LOVAZA) 1 g capsule Take 1 capsule by mouth 2 (two) times daily.  11  . PARoxetine (PAXIL) 40 MG tablet Take 40 mg by mouth daily.   11  . predniSONE (DELTASONE) 5 MG tablet Take 5 mg by mouth daily with breakfast.    . tamsulosin (FLOMAX) 0.4 MG CAPS capsule Take 0.4 mg by mouth daily.   4  . tiZANidine (ZANAFLEX) 4 MG tablet Take 1 tablet by mouth every 8 (eight) hours as needed.   2  . topiramate (TOPAMAX) 25 MG tablet Take 25 mg by mouth 2 (two) times daily.    . traMADol (ULTRAM) 50 MG tablet Take 1 tablet by mouth as needed.  2  . VITAMIN D, ERGOCALCIFEROL, PO Take 5,000 Units by mouth at bedtime.    Marland Kitchen ZETIA 10 MG tablet Take 10 mg by mouth every evening.   11  . predniSONE (DELTASONE) 20 MG tablet 3 TABLETS BY MOUTH 18 HOURS PRIOR AND 2 HOURS PRIOR TO PROCEDURE 6 tablet 0   No current facility-administered medications for this visit.     Allergies:   Ace inhibitors; Atenolol; Contrast media [iodinated diagnostic agents]; and Penicillins    Social History:  The patient  reports that he has never smoked. He has never used smokeless tobacco. He reports that he does not drink alcohol or use drugs.   Family History:  The patient's family  history includes Heart attack in his brother and father; Heart failure in his mother; Hyperlipidemia in his mother; Hypertension in his mother.    ROS:  Please see the history of present illness.   Otherwise, review of systems are positive for none.   All other systems are reviewed and negative.    PHYSICAL EXAM: VS:  BP (!) 148/92   Pulse 84   Ht 5\' 11"  (1.803 m)   Wt 217 lb 6.4 oz (98.6 kg)   BMI 30.32 kg/m  , BMI Body mass index is 30.32 kg/m. GENERAL:  Well appearing HEENT:  Pupils equal round and reactive, fundi not visualized, oral mucosa unremarkable NECK:  No jugular venous distention, waveform within normal limits, carotid upstroke brisk and symmetric, no bruits LYMPHATICS:  No cervical adenopathy LUNGS:  Rhonchi at right base. HEART:  Irregularly irregular.  PMI not displaced or sustained,S1 and S2 within normal limits, no S3, no S4, no clicks, no rubs, no murmurs ABD:  Flat, positive bowel sounds normal in frequency in pitch, no bruits, no rebound, no guarding, no midline pulsatile mass, no hepatomegaly, no splenomegaly EXT:  2 plus pulses throughout, no edema, no cyanosis no clubbing SKIN:  No rashes no nodules NEURO:  Cranial nerves II through XII grossly intact, motor grossly intact throughout PSYCH:  Cognitively intact, oriented to person place and time   EKG:  EKG is ordered today. The ekg ordered 05/22/16 demonstrates atrial fibrillation rate 68 bpm.   09/28/16: Atrial fibrillation rate 84 bpm.  Non-specific ST changes.  Qt prolongation.    Echo 07/20/16: Study Conclusions  - Left ventricle: The cavity size was normal. Wall thickness was   increased in a pattern of mild LVH. Systolic function was normal.   The estimated ejection fraction was in the range of 50% to 55%.   There is akinesis of the basalinferior myocardium.  There is   akinesis of the inferolateral myocardium. Features are consistent   with a pseudonormal left ventricular filling pattern, with    concomitant abnormal relaxation and increased filling pressure   (grade 2 diastolic dysfunction). Doppler parameters are   consistent with high ventricular filling pressure. - Left atrium: The atrium was mildly dilated. - Right atrium: The atrium was mildly dilated.  Impressions:  - Akinesis of the basal inferior wall and inferior lateral wall;   overall low normal LV systolic function; grade 2 diastolic   dysfunction with elevated LV filling pressure; mild biatrial   enlargment; trace MR and TR.   Recent Labs: 03/19/2016: B Natriuretic Peptide 385.9 07/19/2016: ALT 15; Magnesium 2.1; TSH 1.209 07/21/2016: BUN 21; Creatinine, Ser 1.71; Potassium 4.0; Sodium 132 09/28/2016: Hemoglobin 12.7; Platelets 363    Lipid Panel    Component Value Date/Time   CHOL 123 03/22/2016 0320   TRIG 154 (H) 03/22/2016 0320   HDL 25 (L) 03/22/2016 0320   CHOLHDL 4.9 03/22/2016 0320   VLDL 31 03/22/2016 0320   LDLCALC 67 03/22/2016 0320      Wt Readings from Last 3 Encounters:  09/28/16 217 lb 6.4 oz (98.6 kg)  07/26/16 210 lb (95.3 kg)  07/23/16 209 lb 9.6 oz (95.1 kg)      ASSESSMENT AND PLAN:  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  He has at least 16 coronary stents. At his last heart catheterization he was told that he has a tight RCA lesion. Given that he is now having symptoms of angina with minimal exertion previously had no angina, we will proceed with cardiac catheterization, as he has a high pretest probability of disease.  He had a heart catheterization 08/09/1999 at which time he was noted to have 25% proximal to mid RCA, 100% RV branch, 95% acute marginal, less than 25% left main, 25-75% left circumflex, 50% LAD with 75% distally, 50% D1 and 25% D2 lesions.  Mr. Marti has chronic kidney disease as well as a contrast allergy. He will receive saline immediately prior to the procedure. He will also be pretreated with prednisone,  Benadryl, and Pepcid per protocol. He will hold his Eliquis for the 3 days preceding the procedure. During this time he will take aspirin 81 mg daily.  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  His ejection fraction increased to 50-55% during his recent hospitalization.  Blood pressure has been low but is now stable.  Continue carvedilol and Lasix.   # Hypertension/hypotension: BP is  relatively stable.  Continue carvedilol.  # Atrial fibrillation: He remains in sinus rhythm after cardioversion.  Continue Eliquis and carvedilol  This patients CHA2DS2-VASc Score and unadjusted Ischemic Stroke Rate (% per year) is equal to 4.8 % stroke rate/year from a score of 4  Above score calculated as 1 point each if present [CHF, HTN, DM, Vascular=MI/PAD/Aortic Plaque, Age if 65-74, or Male] Above score calculated as 2 points each if present [Age > 75, or Stroke/TIA/TE]    Current medicines are reviewed at length with the patient today.  The patient does not have concerns regarding medicines.  The following changes have been made:  no change  Labs/ tests ordered today include:   Orders Placed This Encounter  Procedures  . Procedural/ Surgical Case Request: Left Heart Cath and Coronary Angiography  . CBC with Differential/Platelet  . INR/PT  .  PTT  . Ambulatory referral to Cardiac Electrophysiology  . EKG 12-Lead     Disposition:   FU with Ferris Fielden C. [name], MD, [facility] in 1 month   This note was written with the assistance of speech recognition software.  Please excuse any transcriptional errors.  Signed, Cayleigh Paull C. [name], MD, [facility]  10/02/2016 6:05 PM    [place]

## 2016-10-03 NOTE — Discharge Instructions (Signed)
Radial Site Care °Refer to this sheet in the next few weeks. These instructions provide you with information about caring for yourself after your procedure. Your health care provider may also give you more specific instructions. Your treatment has been planned according to current medical practices, but problems sometimes occur. Call your health care provider if you have any problems or questions after your procedure. °WHAT TO EXPECT AFTER THE PROCEDURE °After your procedure, it is typical to have the following: °· Bruising at the radial site that usually fades within 1-2 weeks. °· Blood collecting in the tissue (hematoma) that may be painful to the touch. It should usually decrease in size and tenderness within 1-2 weeks. °HOME CARE INSTRUCTIONS °· Take medicines only as directed by your health care provider. °· You may shower 24-48 hours after the procedure or as directed by your health care provider. Remove the bandage (dressing) and gently wash the site with plain soap and water. Pat the area dry with a clean towel. Do not rub the site, because this may cause bleeding. °· Do not take baths, swim, or use a hot tub until your health care provider approves. °· Check your insertion site every day for redness, swelling, or drainage. °· Do not apply powder or lotion to the site. °· Do not flex or bend the affected arm for 24 hours or as directed by your health care provider. °· Do not push or pull heavy objects with the affected arm for 24 hours or as directed by your health care provider. °· Do not lift over 10 lb (4.5 kg) for 5 days after your procedure or as directed by your health care provider. °· Ask your health care provider when it is okay to: °¨ Return to work or school. °¨ Resume usual physical activities or sports. °¨ Resume sexual activity. °· Do not drive home if you are discharged the same day as the procedure. Have someone else drive you. °· You may drive 24 hours after the procedure unless otherwise  instructed by your health care provider. °· Do not operate machinery or power tools for 24 hours after the procedure. °· If your procedure was done as an outpatient procedure, which means that you went home the same day as your procedure, a responsible adult should be with you for the first 24 hours after you arrive home. °· Keep all follow-up visits as directed by your health care provider. This is important. °SEEK MEDICAL CARE IF: °· You have a fever. °· You have chills. °· You have increased bleeding from the radial site. Hold pressure on the site. CALL 911 °SEEK IMMEDIATE MEDICAL CARE IF: °· You have unusual pain at the radial site. °· You have redness, warmth, or swelling at the radial site. °· You have drainage (other than a small amount of blood on the dressing) from the radial site. °· The radial site is bleeding, and the bleeding does not stop after 30 minutes of holding steady pressure on the site. °· Your arm or hand becomes pale, cool, tingly, or numb. °  °This information is not intended to replace advice given to you by your health care provider. Make sure you discuss any questions you have with your health care provider. °  °Document Released: 01/19/2011 Document Revised: 01/07/2015 Document Reviewed: 07/05/2014 °Elsevier Interactive Patient Education ©2016 [employer] ° °

## 2016-10-04 ENCOUNTER — Encounter (HOSPITAL_COMMUNITY): Payer: Self-pay | Admitting: Interventional Cardiology

## 2016-10-04 ENCOUNTER — Other Ambulatory Visit: Payer: Self-pay | Admitting: *Deleted

## 2016-10-04 ENCOUNTER — Other Ambulatory Visit: Payer: Self-pay | Admitting: Cardiovascular Disease

## 2016-10-08 NOTE — Progress Notes (Signed)
Please check fasting lipids before the appointment with [name] on Wednesday

## 2016-10-10 ENCOUNTER — Encounter: Payer: Self-pay | Admitting: Internal Medicine

## 2016-10-10 ENCOUNTER — Ambulatory Visit (INDEPENDENT_AMBULATORY_CARE_PROVIDER_SITE_OTHER): Payer: Medicare Other | Admitting: Internal Medicine

## 2016-10-10 VITALS — BP 108/68 | HR 77 | Ht 71.0 in | Wt 223.6 lb

## 2016-10-10 DIAGNOSIS — I2583 Coronary atherosclerosis due to lipid rich plaque: Secondary | ICD-10-CM | POA: Diagnosis not present

## 2016-10-10 DIAGNOSIS — I2510 Atherosclerotic heart disease of native coronary artery without angina pectoris: Secondary | ICD-10-CM

## 2016-10-10 DIAGNOSIS — I10 Essential (primary) hypertension: Secondary | ICD-10-CM

## 2016-10-10 DIAGNOSIS — I481 Persistent atrial fibrillation: Secondary | ICD-10-CM

## 2016-10-10 DIAGNOSIS — I4819 Other persistent atrial fibrillation: Secondary | ICD-10-CM

## 2016-10-10 NOTE — Progress Notes (Signed)
Electrophysiology Office Note   Date:  10/10/2016   ID:  Julian Ross, DOB 12-14-1946, MRN [id number]  PCP:  Kandice Hams, MD  Cardiologist:  Dr [name] Primary Electrophysiologist: Thompson Grayer, MD    Chief Complaint  Patient presents with  . Atrial Fibrillation     History of Present Illness: Julian Ross is a 70 y.o. male who presents today for electrophysiology evaluation.   He reports having chronic fatigue and fibromyalgia.  He is not very active and retired on disability for "years".  His wife states that he is in bed most of the day.  He has depression which is stable.  He has extensive CAD and is s/p 16 stents between [place] and [facility]. He presented to [facility] in March of this year with pneumonia.  This was quite severe and with associated sepsis.  He required intubation and prolonged hospitalization.  He was found to have atrial fibrillation.  He is unaware of any afib prior to this. He remained in afib and underwent cardioversion by Dr [name] in June of this year.  He quickly returned to afib and was unaware.  He is not sure that he felt better post cardioversion though his wife states that he "moved around more".  He is on eliquis for stroke prevention.  He has chronic anemia but has not has bleeding with eliquis.  He recently had worsening CP for which he underwent cath (reviewed).  Dr Tamala Julian reports severe 3 vessel disease for which CABG could be considered.  The patients wife states that due to his overall health state that he was felt to not be a great candidate for CABG.  He has not tried AAD therapy. + SOB and CP chronically.  Today, he denies symptoms of palpitations,  orthopnea, PND, claudication, bleeding, or neurologic sequela.  He did have syncope in July which he says was attributed to low blood pressure.  He has frequent hypotension.  He also has edema, back pain, muscle aches, fatigue, joint swelling, and anxiety.  The patient is tolerating medications  without difficulties and is otherwise without complaint today.    Past Medical History:  Diagnosis Date  . Anemia   . Anxiety state   . CAD (coronary artery disease)    16 prior stents (at [place])  . Cardiomyopathy, ischemic   . Chronic lower back pain   . Chronic systolic CHF (congestive heart failure) ([place])   . CKD (chronic kidney disease), stage III   . Depression   . Diabetes mellitus type 2 in obese ([place])   . Fibromyalgia   . Hepatitis A    at age 70  . High cholesterol   . History of blood transfusion    "related to OR"  . Hypertension   . Hypokalemia   . MI (myocardial infarction) 1990; 1995; 1997  . OSA on CPAP   . Osteoarthritis   . Persistent atrial fibrillation ([place]) 02/2016   occured in setting of pneumonia  . Rheumatoid arthritis of multiple sites with negative rheumatoid factor ([place])   . Septic shock ([place]) 02/2016   in setting of severe pneumonia   Past Surgical History:  Procedure Laterality Date  . BACK SURGERY    . CARDIAC CATHETERIZATION N/A 10/03/2016   Procedure: Left Heart Cath and Coronary Angiography;  Surgeon: Belva Crome, MD;  Location: [facility];  Service: Cardiovascular;  Laterality: N/A;  . CARDIOVERSION N/A 06/04/2016   Procedure: CARDIOVERSION;  Surgeon: Skeet Latch, MD;  Location: [facility];  Service: Cardiovascular;  Laterality: N/A;  . CATARACT EXTRACTION W/ INTRAOCULAR LENS  IMPLANT, BILATERAL Bilateral 2000s  . CORONARY ANGIOPLASTY  early 38s  . CORONARY ANGIOPLASTY WITH STENT PLACEMENT     "I've got 15 stents" (07/19/2016)  . KNEE ARTHROSCOPY Left 1990s  . POSTERIOR LUMBAR FUSION  2015   [id number]  . right knee surgery     age of 35  . TONSILLECTOMY AND ADENOIDECTOMY  1949     Current Outpatient Prescriptions  Medication Sig Dispense Refill  . acetaminophen (TYLENOL) 325 MG tablet Take 650 mg by mouth every 4 (four) hours as needed for mild pain or headache.    . ALPRAZolam (NIRAVAM) 0.5 MG  dissolvable tablet Take 1 tablet by mouth as needed (Take as directed).   2  . apixaban (ELIQUIS) 5 MG TABS tablet Take 1 tablet (5 mg total) by mouth 2 (two) times daily. 60 tablet   . carvedilol (COREG) 3.125 MG tablet Take 3.125 mg by mouth 2 (two) times daily with a meal.    . fenofibrate (TRICOR) 145 MG tablet Take 145 mg by mouth daily.    . folic acid (FOLVITE) 1 MG tablet Take 1 mg by mouth daily.    . furosemide (LASIX) 20 MG tablet Take 20 mg by mouth 2 (two) times daily.    Marland Kitchen gabapentin (NEURONTIN) 300 MG capsule Take 300 mg by mouth 3 (three) times daily.   2  . hydrocortisone (ANUSOL-HC) 2.5 % rectal cream Place 1 application rectally 2 (two) times daily as needed for hemorrhoids or itching. 30 g 0  . insulin lispro protamine-lispro (HUMALOG 50/50 MIX) (50-50) 100 UNIT/ML SUSP injection Inject 40-50 Units into the skin 3 (three) times daily. 50, 40, 50    . leucovorin (WELLCOVORIN) 10 MG tablet Take 10 mg by mouth See admin instructions. Take 1 tablet (10 mg) by mouth every 12 hours and 24 hours after the methotrexate dose    . LUMIGAN 0.01 % SOLN Place 1 drop into both eyes daily.  0  . magnesium oxide (MAG-OX) 400 (241.3 Mg) MG tablet Take 1 tablet (400 mg total) by mouth 2 (two) times daily. 60 tablet 0  . Methotrexate Sodium (METHOTREXATE, PF,) 50 MG/2ML injection Inject 15 mg into the muscle once a week. Inject 0.8 ml (20 mg) - on Fridays.  Must be given the same time every administration  0  . Misc Natural Products (OSTEO BI-FLEX ADV DOUBLE ST PO) Take 1 tablet by mouth 2 (two) times daily.    Marland Kitchen omega-3 acid ethyl esters (LOVAZA) 1 g capsule Take 1 capsule by mouth 2 (two) times daily.  11  . omeprazole (PRILOSEC) 20 MG capsule Take 20 mg by mouth daily as needed (heartburn or acid reflux).    Marland Kitchen PARoxetine (PAXIL) 40 MG tablet Take 40 mg by mouth daily.   11  . potassium chloride SA (K-DUR,KLOR-CON) 20 MEQ tablet Take 20 mEq by mouth 4 (four) times daily as needed (potassium  supplement).    . predniSONE (DELTASONE) 5 MG tablet Take 5 mg by mouth daily with breakfast.    . tamsulosin (FLOMAX) 0.4 MG CAPS capsule Take 0.4 mg by mouth daily.   4  . tiZANidine (ZANAFLEX) 4 MG tablet Take 1 tablet by mouth every 8 (eight) hours as needed for muscle spasms.   2  . topiramate (TOPAMAX) 25 MG tablet Take 25 mg by mouth 2 (two) times daily.    . traMADol (ULTRAM) 50  MG tablet Take 1 tablet by mouth 3 (three) times daily as needed (pain). Take as directed  2  . VITAMIN D, ERGOCALCIFEROL, PO Take 5,000 Units by mouth daily.     Marland Kitchen ZETIA 10 MG tablet Take 10 mg by mouth daily.   11   No current facility-administered medications for this visit.     Allergies:   Statins; Ace inhibitors; Atenolol; Contrast media [iodinated diagnostic agents]; and Penicillins   Social History:  The patient  reports that he has never smoked. He has never used smokeless tobacco. He reports that he does not drink alcohol or use drugs.   Family History:  The patient's  family history includes Heart attack in his brother and father; Heart failure in his mother; Hyperlipidemia in his mother; Hypertension in his mother.    ROS:  Please see the history of present illness.   All other systems are reviewed and negative.    PHYSICAL EXAM: VS:  BP 108/68   Pulse 77   Ht 5\' 11"  (1.803 m)   Wt 223 lb 9.6 oz (101.4 kg)   BMI 31.19 kg/m  , BMI Body mass index is 31.19 kg/m. GEN: Well nourished, well developed, in no acute distress  HEENT: normal  Neck: no JVD, carotid bruits, or masses Cardiac:  RRR; no murmurs, rubs, or gallops,no edema  Respiratory:  clear to auscultation bilaterally, normal work of breathing GI: soft, nontender, nondistended, + BS MS: no deformity or atrophy  Skin: warm and dry  Neuro:  Strength and sensation are intact Psych: euthymic mood, full affect  EKG:  EKG is ordered today. The ekg ordered today shows afib, V rate 77 bpm, inferior infarct pattern, PVCs    Recent  Labs: 03/19/2016: B Natriuretic Peptide 385.9 07/19/2016: ALT 15; Magnesium 2.1; TSH 1.209 09/28/2016: Hemoglobin 12.7; Platelets 363 10/03/2016: BUN 25; Creatinine, Ser 1.64; Potassium 3.6; Sodium 136    Lipid Panel     Component Value Date/Time   CHOL 123 03/22/2016 0320   TRIG 154 (H) 03/22/2016 0320   HDL 25 (L) 03/22/2016 0320   CHOLHDL 4.9 03/22/2016 0320   VLDL 31 03/22/2016 0320   LDLCALC 67 03/22/2016 0320     Wt Readings from Last 3 Encounters:  10/10/16 223 lb 9.6 oz (101.4 kg)  10/03/16 218 lb (98.9 kg)  09/28/16 217 lb 6.4 oz (98.6 kg)      Other studies Reviewed: Additional studies/ records that were reviewed today include: Dr Darliss Ridgel cath records, Dr Quintella Reichert prior records  Review of the above records today demonstrates: as above   ASSESSMENT AND PLAN:  1.  Persistent atrial fibrillation The patient has mildly symptomatic afib.  He is rate controlled.  He has not tried AAD therapy.  Options would include tikosyn or amiodarone.  He is not currently interested in [place].  Given paucity of symptoms, rate control is also reasonable.  I would avoid amiodarone given methotrexate use and risks of toxicity.  He is a poor candidate for catheter ablation.  He is appropriately anticoagulated with eliquis for chads2vasc score of 5.  He does have history of anemia however. Given CAD (below), should he require CABG for his CAD, I would advise concomitant MAZE and LAA amputation.  I think that he may be well served from LAA amputation given his risks of stroke and also bleeding long term.  2. 3v CAD Recent cath is reviewed.  I have spoken with Dr [name] today.  The patient's medical therapy is limited by  hypotension.  He may be best served with CABG and MAZE/ LAA amputation.  He will discuss with Dr [name] on follow-up later this week.  I think it would be reasonable to refer to surgery for further consultation.  3. HTN Stable No change required today  Follow-up with Dr  [name] as scheduled I am happy to see as needed going forward  Current medicines are reviewed at length with the patient today.   The patient does not have concerns regarding his medicines.  The following changes were made today:  none   This patient has a very challenging medical presentation.  He is at risk for decompensation/ hospitalization.  I have discussed today with Dr [name].  A high level of decision making was required for this encounter.  Army Fossa, MD  10/10/2016 10:02 AM     [facility] [address] [place] [address] [phone] (office) [phone] (fax)

## 2016-10-10 NOTE — Patient Instructions (Signed)
Medication Instructions:  Your physician recommends that you continue on your current medications as directed. Please refer to the Current Medication list given to you today.  Labwork: None ordered.  Testing/Procedures: None ordered.  Follow-Up: Your physician recommends that you schedule a follow-up appointment as needed.   Any Other Special Instructions Will Be Listed Below (If Applicable).     If you need a refill on your cardiac medications before your next appointment, please call your pharmacy.   

## 2016-10-12 ENCOUNTER — Encounter: Payer: Self-pay | Admitting: Cardiovascular Disease

## 2016-10-12 ENCOUNTER — Ambulatory Visit (INDEPENDENT_AMBULATORY_CARE_PROVIDER_SITE_OTHER): Payer: Medicare Other | Admitting: Cardiovascular Disease

## 2016-10-12 VITALS — BP 165/97 | HR 88 | Ht 71.0 in | Wt 220.4 lb

## 2016-10-12 DIAGNOSIS — I10 Essential (primary) hypertension: Secondary | ICD-10-CM

## 2016-10-12 DIAGNOSIS — I480 Paroxysmal atrial fibrillation: Secondary | ICD-10-CM | POA: Diagnosis not present

## 2016-10-12 DIAGNOSIS — I25118 Atherosclerotic heart disease of native coronary artery with other forms of angina pectoris: Secondary | ICD-10-CM | POA: Diagnosis not present

## 2016-10-12 DIAGNOSIS — I209 Angina pectoris, unspecified: Secondary | ICD-10-CM

## 2016-10-12 DIAGNOSIS — R931 Abnormal findings on diagnostic imaging of heart and coronary circulation: Secondary | ICD-10-CM | POA: Diagnosis not present

## 2016-10-12 DIAGNOSIS — I255 Ischemic cardiomyopathy: Secondary | ICD-10-CM

## 2016-10-12 MED ORDER — RANOLAZINE ER 500 MG PO TB12: 500.0000 mg | ORAL_TABLET | Freq: Two times a day (BID) | ORAL | 1 refills | Status: DC

## 2016-10-12 NOTE — Progress Notes (Signed)
Cardiology Office Note   Date:  10/13/2016   ID:  Julian Ross, DOB [birth year], MRN [id number]  PCP:  Kandice Hams, MD  Cardiologist:   Skeet Latch, MD   Chief Complaint  Patient presents with  . Follow-up    post cath; edema in ankles.      History of Present Illness: Julian Ross is a 69 y.o. male with chronic systolic and diastolic heart failure LVEF improved from 30-35% to 50-55%, paroxysmal atrial fibrillation, CAD s/p multiple PCIs, who presents for follow up.  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  During that hospitalization he also had elevated troponin consistent with demand ischemia (troponin 0.13) and new onset atrial fibrillation with RVR.  He was diuresed to a discharge weight of 196 lb (from 208 lb on admit).  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  He previously had 16 stents placed in [place], [place].  His cardiologist there is [name].  The inpatient team contacted [name], who informed them that [name] has a tight RCA lesion but no recent interventions.  He felt that [name] could transition from Plavix to aspirin.  He has been intolerant to statins in the past.    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  After cardioversion he developed bradycardia and initially metoprolol was reduced.  Then he was switched from metoprolol to carvedilol.  This was further reduced to 6.25mg  bid on 06/19/16.  On 7/21 he had a recurrent episode of syncope. Hydralazine and isosorbide were discontinued.  Troponin was mildly elevated at 0.03.  CT of the head was negative, and chest x-ray showed no acute edema or infiltrate.  During that hospitalization he was noted to be orthostatic.  His SBP dropped to 80 when standing.  Carvedilol was reduced 1.5625 mg bid and his orthostasis was improved.  A 30 day event monitor was placed and it was recommended that  outpatient Myoview be considered.  Echo that admission showed an improvement in his LVEF to 50-55%. He wore an event monitor 07/31/16 that revealed short runs of atrial fibrillation as well as PVCs and PACs.  At his last appointment [name] reported episodes of angina. He was referred for cardiac catheterization where he was noted to have severe, diffuse, three-vessel coronary disease with multiple areas of prior stenting. A recommendation was made for intensification of medical therapy or consideration of coronary artery bypass grafting. He also was evaluated by [name] for management of his atrial fibrillation.  He was felt to be a poor candidate for ablation.  Amiodarone and dofetilide would be options, but it was unclear whether atrial fibrillation was actually causing symptoms.  [name] suggested considering CABG with a surgical MAZE and clipping of the LAA.  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  However, he has been very inactive to avoid angina.  His wife reports that he has been lying in the bed for over 24 hours.  He denies palpitations, lightheadedness, dizziness, orthopnea, PND or edema.  He has been checking his blood pressure which continues to be labile, but mostly low.  He did not take carvedilol today before his appointment.   Past Medical History:  Diagnosis Date  . Anemia   . Anxiety state   . CAD (coronary artery disease)    16 prior stents (at [facility])  . Cardiomyopathy, ischemic   . Chronic lower back pain   .  Chronic systolic CHF (congestive heart failure) ([place])   . CKD (chronic kidney disease), stage III   . Depression   . Diabetes mellitus type 2 in obese ([place])   . Fibromyalgia   . Hepatitis A    at age 52  . High cholesterol   . History of blood transfusion    "related to OR"  . Hypertension   . Hypokalemia   . MI (myocardial infarction) 1990; 1995; 1997  . OSA on CPAP   . Osteoarthritis   . Persistent atrial fibrillation ([place]) 02/2016   occured  in setting of pneumonia  . Rheumatoid arthritis of multiple sites with negative rheumatoid factor ([place])   . Septic shock ([place]) 02/2016   in setting of severe pneumonia    Past Surgical History:  Procedure Laterality Date  . BACK SURGERY    . CARDIAC CATHETERIZATION N/A 10/03/2016   Procedure: Left Heart Cath and Coronary Angiography;  Surgeon: Belva Crome, MD;  Location: [facility];  Service: Cardiovascular;  Laterality: N/A;  . CARDIOVERSION N/A 06/04/2016   Procedure: CARDIOVERSION;  Surgeon: Skeet Latch, MD;  Location: [facility];  Service: Cardiovascular;  Laterality: N/A;  . CATARACT EXTRACTION W/ INTRAOCULAR LENS  IMPLANT, BILATERAL Bilateral 2000s  . CORONARY ANGIOPLASTY  early 38s  . CORONARY ANGIOPLASTY WITH STENT PLACEMENT     "I've got 15 stents" (07/19/2016)  . KNEE ARTHROSCOPY Left 1990s  . POSTERIOR LUMBAR FUSION  2015   L4-5-S1  . right knee surgery     age of 69  . TONSILLECTOMY AND ADENOIDECTOMY  1949     Current Outpatient Prescriptions  Medication Sig Dispense Refill  . acetaminophen (TYLENOL) 325 MG tablet Take 650 mg by mouth every 4 (four) hours as needed for mild pain or headache.    . ALPRAZolam (NIRAVAM) 0.5 MG dissolvable tablet Take 1 tablet by mouth as needed (Take as directed).   2  . apixaban (ELIQUIS) 5 MG TABS tablet Take 1 tablet (5 mg total) by mouth 2 (two) times daily. 60 tablet   . carvedilol (COREG) 3.125 MG tablet Take 3.125 mg by mouth 2 (two) times daily with a meal.    . fenofibrate (TRICOR) 145 MG tablet Take 145 mg by mouth daily.    . folic acid (FOLVITE) 1 MG tablet Take 1 mg by mouth daily.    . furosemide (LASIX) 20 MG tablet Take 20 mg by mouth 2 (two) times daily.    Marland Kitchen gabapentin (NEURONTIN) 300 MG capsule Take 300 mg by mouth 3 (three) times daily.   2  . hydrocortisone (ANUSOL-HC) 2.5 % rectal cream Place 1 application rectally 2 (two) times daily as needed for hemorrhoids or itching. 30 g 0  . insulin lispro  protamine-lispro (HUMALOG 50/50 MIX) (50-50) 100 UNIT/ML SUSP injection Inject 40-50 Units into the skin 3 (three) times daily. 50, 40, 50    . leucovorin (WELLCOVORIN) 10 MG tablet Take 10 mg by mouth See admin instructions. Take 1 tablet (10 mg) by mouth every 12 hours and 24 hours after the methotrexate dose    . LUMIGAN 0.01 % SOLN Place 1 drop into both eyes daily.  0  . magnesium oxide (MAG-OX) 400 (241.3 Mg) MG tablet Take 1 tablet (400 mg total) by mouth 2 (two) times daily. 60 tablet 0  . Methotrexate Sodium (METHOTREXATE, PF,) 50 MG/2ML injection Inject 15 mg into the muscle once a week. Inject 0.8 ml (20 mg) - on Fridays.  Must be given  the same time every administration  0  . Misc Natural Products (OSTEO BI-FLEX ADV DOUBLE ST PO) Take 1 tablet by mouth 2 (two) times daily.    Marland Kitchen omega-3 acid ethyl esters (LOVAZA) 1 g capsule Take 1 capsule by mouth 2 (two) times daily.  11  . omeprazole (PRILOSEC) 20 MG capsule Take 20 mg by mouth daily as needed (heartburn or acid reflux).    Marland Kitchen PARoxetine (PAXIL) 40 MG tablet Take 40 mg by mouth daily.   11  . potassium chloride SA (K-DUR,KLOR-CON) 20 MEQ tablet Take 20 mEq by mouth 4 (four) times daily as needed (potassium supplement).    . predniSONE (DELTASONE) 5 MG tablet Take 5 mg by mouth daily with breakfast.    . tamsulosin (FLOMAX) 0.4 MG CAPS capsule Take 0.4 mg by mouth daily.   4  . tiZANidine (ZANAFLEX) 4 MG tablet Take 1 tablet by mouth every 8 (eight) hours as needed for muscle spasms.   2  . topiramate (TOPAMAX) 25 MG tablet Take 25 mg by mouth 2 (two) times daily.    . traMADol (ULTRAM) 50 MG tablet Take 1 tablet by mouth 3 (three) times daily as needed (pain). Take as directed  2  . VITAMIN D, ERGOCALCIFEROL, PO Take 5,000 Units by mouth daily.     Marland Kitchen ZETIA 10 MG tablet Take 10 mg by mouth daily.   11  . ranolazine (RANEXA) 500 MG 12 hr tablet Take 1 tablet (500 mg total) by mouth 2 (two) times daily. 180 tablet 1   No current  facility-administered medications for this visit.     Allergies:   Statins; Ace inhibitors; Atenolol; Contrast media [iodinated diagnostic agents]; and Penicillins    Social History:  The patient  reports that he has never smoked. He has never used smokeless tobacco. He reports that he does not drink alcohol or use drugs.   Family History:  The patient's family history includes Heart attack in his brother and father; Heart failure in his mother; Hyperlipidemia in his mother; Hypertension in his mother.    ROS:  Please see the history of present illness.   Otherwise, review of systems are positive for none.   All other systems are reviewed and negative.    PHYSICAL EXAM: VS:  BP (!) 165/97   Pulse 88   Ht 5\' 11"  (1.803 m)   Wt 220 lb 6.4 oz (100 kg)   BMI 30.74 kg/m  , BMI Body mass index is 30.74 kg/m. GENERAL:  Well appearing HEENT:  Pupils equal round and reactive, fundi not visualized, oral mucosa unremarkable NECK:  No jugular venous distention, waveform within normal limits, carotid upstroke brisk and symmetric, no bruits LYMPHATICS:  No cervical adenopathy LUNGS:  Rhonchi at right base. HEART:  Irregularly irregular.  PMI not displaced or sustained,S1 and S2 within normal limits, no S3, no S4, no clicks, no rubs, no murmurs ABD:  Flat, positive bowel sounds normal in frequency in pitch, no bruits, no rebound, no guarding, no midline pulsatile mass, no hepatomegaly, no splenomegaly EXT:  2 plus pulses throughout, no edema, no cyanosis no clubbing SKIN:  No rashes no nodules NEURO:  Cranial nerves II through XII grossly intact, motor grossly intact throughout PSYCH:  Cognitively intact, oriented to person place and time   EKG:  EKG is ordered today. The ekg ordered 05/22/16 demonstrates atrial fibrillation rate 68 bpm.   09/28/16: Atrial fibrillation rate 84 bpm.  Non-specific ST changes.  Qt prolongation.  Echo 07/20/16: Study Conclusions  - Left ventricle: The cavity  size was normal. Wall thickness was   increased in a pattern of mild LVH. Systolic function was normal.   The estimated ejection fraction was in the range of 50% to 55%.   There is akinesis of the basalinferior myocardium. There is   akinesis of the inferolateral myocardium. Features are consistent   with a pseudonormal left ventricular filling pattern, with   concomitant abnormal relaxation and increased filling pressure   (grade 2 diastolic dysfunction). Doppler parameters are   consistent with high ventricular filling pressure. - Left atrium: The atrium was mildly dilated. - Right atrium: The atrium was mildly dilated.  Impressions:  - Akinesis of the basal inferior wall and inferior lateral wall;   overall low normal LV systolic function; grade 2 diastolic   dysfunction with elevated LV filling pressure; mild biatrial   enlargment; trace MR and TR.  LHC 10/03/16: Ost Cx to Dist Cx lesion, 65 %stenosed.  2nd Mrg lesion, 75 %stenosed.  Dist Cx lesion, 80 %stenosed.  Dist LAD lesion, 90 %stenosed.  Mid LAD lesion, 80 %stenosed.  Prox LAD to Mid LAD lesion, 70 %stenosed.   1st Diag lesion, 75 %stenosed.  1st RPLB lesion, 100 %stenosed.  Prox RCA to Dist RCA lesion, 100 %stenosed.  LV end diastolic pressure is mildly elevated.    Severe diffuse three-vessel coronary disease in this patient with multiple prior stents.  Total occlusion of the right coronary within the ostial segment. Distal vessel fills by collaterals from the left coronary. The distal right coronary is small and may not be graftable.  Severe diffuse LAD disease with 70% proximal stenosis, 80% mid stenosis, and 90% apical stenosis. The first diagonal which is relatively small contains segmental 70-80% stenosis.  Heavily stented proximal to mid circumflex coronary artery with 50-70% in-stent restenosis, diffuse. Large second obtuse marginal with 75% proximal diffuse narrowing. 70-80% segmental narrowing in  the mid circumflex after the origin of the second obtuse marginal.  Mildly elevated LV EDP. Recent echo demonstrating EF greater than 50%. Contrast LV gram not performed due to chronic kidney disease. Total contrast used was 80 cc.  Recent Labs: 03/19/2016: B Natriuretic Peptide 385.9 07/19/2016: ALT 15; Magnesium 2.1; TSH 1.209 09/28/2016: Hemoglobin 12.7; Platelets 363 10/03/2016: BUN 25; Creatinine, Ser 1.64; Potassium 3.6; Sodium 136    Lipid Panel    Component Value Date/Time   CHOL 123 03/22/2016 0320   TRIG 154 (H) 03/22/2016 0320   HDL 25 (L) 03/22/2016 0320   CHOLHDL 4.9 03/22/2016 0320   VLDL 31 03/22/2016 0320   LDLCALC 67 03/22/2016 0320      Wt Readings from Last 3 Encounters:  10/12/16 220 lb 6.4 oz (100 kg)  10/10/16 223 lb 9.6 oz (101.4 kg)  10/03/16 218 lb (98.9 kg)      ASSESSMENT AND PLAN:  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  His most recent cath revealed three vessel CAD.  He doesn't have much room for optimization of medication due to hypotension.  LDL is <70, so he wouldn't qualify for PCSK9.  He is limiting his activity in order to avoid angina.  We will add Ranexa with the hope that he can start back walking for exercise.  We will refer him to Dr. [name] for consideration of CABG and MAZE.   # Chronic systolic and diastolic heart failure:   LVEF  50-55%.  Continue carvedilol and Lasix.   # Hypertension/hypotension: BP is high today but has been in the 95M systolic at home.  He has not yet taken his dose today.  Continue carvedilol.  # Atrial fibrillation: He is back in atrial fibrillation after cardioversion.  Continue Eliquis and carvedilol.  Will plan for surgical MAZE with LAA clipping.  This patients CHA2DS2-VASc Score and unadjusted Ischemic Stroke Rate (% per year) is equal to 4.8 % stroke rate/year from a score of 4  Above score calculated as 1 point each if present [CHF, HTN, DM,  Vascular=MI/PAD/Aortic Plaque, Age if 65-74, or Male] Above score calculated as 2 points each if present [Age > 75, or Stroke/TIA/TE]    Current medicines are reviewed at length with the patient today.  The patient does not have concerns regarding medicines.  The following changes have been made:  Add Ranexa  Labs/ tests ordered today include:   Orders Placed This Encounter  Procedures  . Ambulatory referral to Cardiothoracic Surgery     Disposition:   FU with Salisa Broz C. [name], MD, [facility] in 2 months   This note was written with the assistance of speech recognition software.  Please excuse any transcriptional errors.  Signed, Slaton Reaser C. [name], MD, [facility]  10/13/2016 10:27 AM    [place]

## 2016-10-12 NOTE — Patient Instructions (Addendum)
Medication Instructions:  START RANEXA 500 MG TWICE A DAY  Labwork: NONE  Testing/Procedures: NONE  Follow-Up: Your physician recommends that you schedule a follow-up appointment in: 2 MONTH OV  You have been referred to Dr [name]. If you do not hear from their office you may call them directly at [phone]   If you need a refill on your cardiac medications before your next appointment, please call your pharmacy.

## 2016-10-19 ENCOUNTER — Encounter: Payer: Self-pay | Admitting: Thoracic Surgery (Cardiothoracic Vascular Surgery)

## 2016-10-19 ENCOUNTER — Institutional Professional Consult (permissible substitution) (INDEPENDENT_AMBULATORY_CARE_PROVIDER_SITE_OTHER): Payer: Medicare Other | Admitting: Thoracic Surgery (Cardiothoracic Vascular Surgery)

## 2016-10-19 VITALS — BP 149/95 | HR 94 | Resp 20 | Ht 71.0 in | Wt 220.0 lb

## 2016-10-19 DIAGNOSIS — I2510 Atherosclerotic heart disease of native coronary artery without angina pectoris: Secondary | ICD-10-CM | POA: Diagnosis not present

## 2016-10-19 DIAGNOSIS — I209 Angina pectoris, unspecified: Secondary | ICD-10-CM

## 2016-10-19 DIAGNOSIS — I5042 Chronic combined systolic (congestive) and diastolic (congestive) heart failure: Secondary | ICD-10-CM

## 2016-10-19 DIAGNOSIS — I25118 Atherosclerotic heart disease of native coronary artery with other forms of angina pectoris: Secondary | ICD-10-CM | POA: Diagnosis not present

## 2016-10-19 DIAGNOSIS — I255 Ischemic cardiomyopathy: Secondary | ICD-10-CM

## 2016-10-19 DIAGNOSIS — I480 Paroxysmal atrial fibrillation: Secondary | ICD-10-CM

## 2016-10-19 NOTE — Progress Notes (Signed)
[address]       [place] [address]             [id number]     CARDIOTHORACIC SURGERY CONSULTATION REPORT  Referring Provider is Skeet Latch, MD PCP is Kandice Hams, MD  Chief Complaint  Patient presents with  . Coronary Artery Disease    Surgical eval, Cardiac cath 10/03/16,Electrical Cardioversion 06/04/16, ECHO 07/20/16    HPI:  Patient is a 70 year old male with long-standing history of coronary artery disease status post myocardial infarction in the remote past, multiple previous PCI procedures, ischemic cardiomyopathy with chronic combined systolic and diastolic congestive heart failure, recurrent paroxysmal atrial fibrillation, chronic kidney disease, type 2 diabetes mellitus, OSA on CPAP, rheumatoid arthritis on long-term immunosuppressive therapy, depression, chronic pain, chronic tremor,and limited physical mobility who has been referred for surgical consultation to discuss treatment options for management of multivessel coronary artery disease with chronic stable angina pectoris and recurrent paroxysmal atrial fibrillation. The patient's cardiac history dates back to 1997 when he suffered his first acute myocardial infarction. At time he lived in [place] and he was followed for many years both in [place] and at [facility]. He underwent numerous PCI and stent procedures involving both the left circumflex and the right coronary territories. He did fairly well for many years. Approximately one year ago he moved to [place] to be closer to his son.  In March of this year he was hospitalized with sepsis, hypoxic acute respiratory failure, metabolic encephalopathy, and acute on chronic heart failure. He ruled in for acute myocardial infarction based on slightly elevated troponin levels. He developed new onset atrial fibrillation with rapid ventricular response. Echocardiogram performed at that time revealed ejection fraction estimated  30-35%. He did not undergo diagnostic cardiac catheterization and was treated medically. He underwent cardioversion in June of this year, after which time he developed bradycardia with syncope. This resolved with decreasing beta blocker therapy. Echocardiogram performed at that time demonstrated improved left ventricular systolic function with ejection fraction estimated 50-55%.  He underwent an event monitor over the next month that revealed short runs of recurrent paroxysmal atrial fibrillation as well as PVCs and PACs, all of which were asymptomatic. The patient did not have any further episodes of bradycardia or syncope. He was seen in follow-up by Dr. [name] and complained of intermittent substernal chest discomfort associated with physical exertion and relieved by rest. He subsequently underwent diagnostic cardiac catheterization by Dr. Tamala Julian on 10/03/2016. He was found to have severe three-vessel coronary artery diseas.  Left ventricular end-diastolic pressure was only mildly elevated. Right heart catheterization was not performed. Left ventricular function was not assessed directly. An attempt at intensive medical treatment for angina was recommended with consideration of referral for coronary artery bypass grafting if the patient remains symptomatic. The patient was seen in follow-up by Dr. [name] and recently started on Ranexa.  Surgical consultation was recommended.  The patient is married and lives with his wife locally in [place]. 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  He has had a long history of problems with chronic fatigue, depression, and limited activity. The patient states that he has gotten considerably worse over the past year. He describes substernal chest tightness with activity that is usually relieved by rest or administration of sublingual nitroglycerin. He denies any history of nocturnal angina or  rest pain. He states that  his physical mobility is limited by poor energy and chronic pain. With ambulation he develops pain in his feet, knees, and hips. The patient's family states that he spends most of the day in bed sleeping and he is very reluctant to do much of anything. He denies any history of palpitations. He has mild chronic lower extremity edema. He has not recently had any dizzy spells or syncope.  Past Medical History:  Diagnosis Date  . Anemia   . Anxiety state   . CAD (coronary artery disease)    16 prior stents (at [facility])  . Cardiomyopathy, ischemic   . Chronic lower back pain   . Chronic systolic CHF (congestive heart failure) ([place])   . CKD (chronic kidney disease), stage III   . Depression   . Diabetes mellitus type 2 in obese ([place])   . Fibromyalgia   . Hepatitis A    at age 11  . High cholesterol   . History of blood transfusion    "related to OR"  . Hypertension   . Hypokalemia   . MI (myocardial infarction) 1990; 1995; 1997  . OSA on CPAP   . Osteoarthritis   . Persistent atrial fibrillation ([place]) 02/2016   occured in setting of pneumonia  . Rheumatoid arthritis of multiple sites with negative rheumatoid factor ([place])   . Septic shock ([place]) 02/2016   in setting of severe pneumonia    Past Surgical History:  Procedure Laterality Date  . BACK SURGERY    . CARDIAC CATHETERIZATION N/A 10/03/2016   Procedure: Left Heart Cath and Coronary Angiography;  Surgeon: Belva Crome, MD;  Location: [facility];  Service: Cardiovascular;  Laterality: N/A;  . CARDIOVERSION N/A 06/04/2016   Procedure: CARDIOVERSION;  Surgeon: Skeet Latch, MD;  Location: [facility];  Service: Cardiovascular;  Laterality: N/A;  . CATARACT EXTRACTION W/ INTRAOCULAR LENS  IMPLANT, BILATERAL Bilateral 2000s  . CORONARY ANGIOPLASTY  early 65s  . CORONARY ANGIOPLASTY WITH STENT PLACEMENT     "I've got 15 stents" (07/19/2016)  . KNEE ARTHROSCOPY Left 1990s  . POSTERIOR  LUMBAR FUSION  2015   L4-5-S1  . right knee surgery     age of 33  . TONSILLECTOMY AND ADENOIDECTOMY  1949    Family History  Problem Relation Age of Onset  . Heart failure Mother   . Hyperlipidemia Mother   . Hypertension Mother   . Heart attack Father   . Heart attack Brother     Social History   Social History  . Marital status: Married    Spouse name: N/A  . Number of children: N/A  . Years of education: N/A   Occupational History  . Not on file.   Social History Main Topics  . Smoking status: Never Smoker  . Smokeless tobacco: Never Used  . Alcohol use No  . Drug use: No  . Sexual activity: Not Currently   Other Topics Concern  . Not on file   Social History Narrative   Micheal Likens, Dentist retired on disability.  Lived in [place] but recently moved to [place].    Current Outpatient Prescriptions  Medication Sig Dispense Refill  . acetaminophen (TYLENOL) 325 MG tablet Take 650 mg by mouth every 4 (four) hours as needed for mild pain or headache.    . ALPRAZolam (NIRAVAM) 0.5 MG dissolvable tablet Take 1 tablet by mouth as needed (Take as directed).   2  . apixaban (ELIQUIS) 5 MG TABS tablet Take 1 tablet (  5 mg total) by mouth 2 (two) times daily. 60 tablet   . carvedilol (COREG) 3.125 MG tablet Take 3.125 mg by mouth 2 (two) times daily with a meal.    . fenofibrate (TRICOR) 145 MG tablet Take 145 mg by mouth daily.    . folic acid (FOLVITE) 1 MG tablet Take 1 mg by mouth daily.    . furosemide (LASIX) 20 MG tablet Take 20 mg by mouth 2 (two) times daily.    Marland Kitchen gabapentin (NEURONTIN) 300 MG capsule Take 300 mg by mouth 3 (three) times daily.   2  . hydrocortisone (ANUSOL-HC) 2.5 % rectal cream Place 1 application rectally 2 (two) times daily as needed for hemorrhoids or itching. 30 g 0  . insulin lispro protamine-lispro (HUMALOG 50/50 MIX) (50-50) 100 UNIT/ML SUSP injection Inject 40-50 Units into the skin 3 (three) times daily. 50, 40, 50    . leucovorin  (WELLCOVORIN) 10 MG tablet Take 10 mg by mouth See admin instructions. Take 1 tablet (10 mg) by mouth every 12 hours and 24 hours after the methotrexate dose    . LUMIGAN 0.01 % SOLN Place 1 drop into both eyes daily.  0  . magnesium oxide (MAG-OX) 400 (241.3 Mg) MG tablet Take 1 tablet (400 mg total) by mouth 2 (two) times daily. 60 tablet 0  . Methotrexate Sodium (METHOTREXATE, PF,) 50 MG/2ML injection Inject 15 mg into the muscle once a week. Inject 0.8 ml (20 mg) - on Fridays.  Must be given the same time every administration  0  . Misc Natural Products (OSTEO BI-FLEX ADV DOUBLE ST PO) Take 1 tablet by mouth 2 (two) times daily.    Marland Kitchen omega-3 acid ethyl esters (LOVAZA) 1 g capsule Take 1 capsule by mouth 2 (two) times daily.  11  . omeprazole (PRILOSEC) 20 MG capsule Take 20 mg by mouth daily as needed (heartburn or acid reflux).    Marland Kitchen PARoxetine (PAXIL) 40 MG tablet Take 40 mg by mouth daily.   11  . potassium chloride SA (K-DUR,KLOR-CON) 20 MEQ tablet Take 20 mEq by mouth 4 (four) times daily as needed (potassium supplement).    . predniSONE (DELTASONE) 5 MG tablet Take 5 mg by mouth daily with breakfast.    . ranolazine (RANEXA) 500 MG 12 hr tablet Take 1 tablet (500 mg total) by mouth 2 (two) times daily. 180 tablet 1  . tamsulosin (FLOMAX) 0.4 MG CAPS capsule Take 0.4 mg by mouth daily.   4  . tiZANidine (ZANAFLEX) 4 MG tablet Take 1 tablet by mouth every 8 (eight) hours as needed for muscle spasms.   2  . topiramate (TOPAMAX) 25 MG tablet Take 25 mg by mouth 2 (two) times daily.    . traMADol (ULTRAM) 50 MG tablet Take 1 tablet by mouth 3 (three) times daily as needed (pain). Take as directed  2  . VITAMIN D, ERGOCALCIFEROL, PO Take 5,000 Units by mouth daily.     Marland Kitchen ZETIA 10 MG tablet Take 10 mg by mouth daily.   11   No current facility-administered medications for this visit.     Allergies  Allergen Reactions  . Statins Other (See Comments)    Causes stiffness in joints   . Ace  Inhibitors Cough  . Atenolol Other (See Comments)    Unknown reaction  . Contrast Media [Iodinated Diagnostic Agents] Rash    Has to take benadryl prior to use  . Penicillins Hives and Rash    Has  patient had a PCN reaction causing immediate rash, facial/tongue/throat swelling, SOB or lightheadedness with hypotension: Yes Has patient had a PCN reaction causing severe rash involving mucus membranes or skin necrosis: No Has patient had a PCN reaction that required hospitalization pt was in the hospital at time of last reaction - heart attack Has patient had a PCN reaction occurring within the last 10 years: No If all of the above answers are "NO", then may proceed with Cephalosporin use.      Review of Systems:   General:  normal appetite, decreased energy, no weight gain, no weight loss, no fever  Cardiac:  + chest pain with exertion, no chest pain at rest, slight SOB with exertion, no resting SOB, no PND, no orthopnea, no palpitations, + arrhythmia, + atrial fibrillation, + LE edema, no dizzy spells, no syncope  Respiratory:  no shortness of breath, no home oxygen, + productive cough, no dry cough, no bronchitis, no wheezing, no hemoptysis, no asthma, no pain with inspiration or cough, + sleep apnea, + CPAP at night  GI:   no difficulty swallowing, no reflux, no frequent heartburn, no hiatal hernia, no abdominal pain, no constipation, no diarrhea, no hematochezia, no hematemesis, no melena  GU:   no dysuria,  no frequency, no urinary tract infection, no hematuria, + enlarged prostate, no kidney stones, + kidney disease  Vascular:  no pain suggestive of claudication, + pain in feet, no leg cramps, no varicose veins, no DVT, no non-healing foot ulcer  Neuro:   no stroke, no TIA's, no seizures, no headaches, no temporary blindness one eye,  no slurred speech, no peripheral neuropathy, + chronic pain, + instability of gait, no memory/cognitive dysfunction, + chronic tremor  Musculoskeletal: +  rheumatoid arthritis, + joint swelling, + myalgias, + difficulty walking, decreased mobility   Skin:   no rash, no itching, no skin infections, no pressure sores or ulcerations  Psych:   + anxiety, + depression, no nervousness, no unusual recent stress  Eyes:   no blurry vision, no floaters, no recent vision changes, + wears glasses or contacts  ENT:   + hearing loss, no loose or painful teeth, no dentures, last saw dentist January 2017  Hematologic:  + easy bruising, no abnormal bleeding, no clotting disorder, no frequent epistaxis  Endocrine:  + diabetes, does check CBG's at home     Physical Exam:   BP (!) 149/95 (BP Location: Left Arm, Patient Position: Sitting, Cuff Size: Normal)   Pulse 94   Resp 20   Ht 5\' 11"  (1.803 m)   Wt 220 lb (99.8 kg)   SpO2 96% Comment: RA  BMI 30.68 kg/m   General:  Well nourished male NAD  HEENT:  Unremarkable   Neck:   no JVD, no bruits, no adenopathy   Chest:   clear to auscultation, symmetrical breath sounds, no wheezes, no rhonchi   CV:   Irregular rate and rhythm, no murmur   Abdomen:  soft, non-tender, no masses   Extremities:  warm, well-perfused, pulses diminsihed, no LE edema  Rectal/GU  Deferred  Neuro:   Grossly non-focal and symmetrical throughout  Skin:   Clean and dry, no rashes, no breakdown   Diagnostic Tests:  Transthoracic Echocardiography  Patient:    Kinston, Magnan MR #:       [id number] Study Date: 07/20/2016 Gender:     M Age:        70 Height:     180.3 cm Weight:  93.5 kg BSA:        2.18 m^2 Pt. Status: Room:       Sanger, Easley, [place], Alma M  ATTENDING    Long, [facility]  SONOGRAPHER  Jimmy Reel, RDCS  PERFORMING   [facility], Inpatient  cc:  ------------------------------------------------------------------- LV EF: 50% -   55%  ------------------------------------------------------------------- Indications:      Syncope  780.2.  ------------------------------------------------------------------- History:   PMH:   Syncope.  Coronary artery disease.  Congestive heart failure.  Risk factors:  Hypertension.  ------------------------------------------------------------------- Study Conclusions  - Left ventricle: The cavity size was normal. Wall thickness was   increased in a pattern of mild LVH. Systolic function was normal.   The estimated ejection fraction was in the range of 50% to 55%.   There is akinesis of the basalinferior myocardium. There is   akinesis of the inferolateral myocardium. Features are consistent   with a pseudonormal left ventricular filling pattern, with   concomitant abnormal relaxation and increased filling pressure   (grade 2 diastolic dysfunction). Doppler parameters are   consistent with high ventricular filling pressure. - Left atrium: The atrium was mildly dilated. - Right atrium: The atrium was mildly dilated.  Impressions:  - Akinesis of the basal inferior wall and inferior lateral wall;   overall low normal LV systolic function; grade 2 diastolic   dysfunction with elevated LV filling pressure; mild biatrial   enlargment; trace MR and TR.  ------------------------------------------------------------------- Study data:  The previous study was not available, so comparison was made to the report of 03/19/2016.  Study status:  Routine. Procedure:  The patient reported no pain pre or post test. Transthoracic echocardiography. Image quality was adequate.  Study completion:  There were no complications.          Transthoracic echocardiography.  M-mode, complete 2D, spectral Doppler, and color Doppler.  Birthdate:  Patient birthdate: 1946-05-23.  Age:  Patient is 70 yr old.  Sex:  Gender: male.    BMI: 28.8 kg/m^2.  Blood pressure:     120/62  Patient status:  Inpatient.  Study date: Study date: 07/20/2016. Study time: 10:36 AM.  Location:  Bedside.    -------------------------------------------------------------------  ------------------------------------------------------------------- Left ventricle:  The cavity size was normal. Wall thickness was increased in a pattern of mild LVH. Systolic function was normal. The estimated ejection fraction was in the range of 50% to 55%. Regional wall motion abnormalities:   There is akinesis of the basalinferior myocardium.  There is akinesis of the inferolateral myocardium. Features are consistent with a pseudonormal left ventricular filling pattern, with concomitant abnormal relaxation and increased filling pressure (grade 2 diastolic dysfunction). Doppler parameters are consistent with high ventricular filling pressure.  ------------------------------------------------------------------- Aortic valve:   Trileaflet; mildly thickened leaflets. Mobility was not restricted.  Doppler:  Transvalvular velocity was within the normal range. There was no stenosis. There was no regurgitation.   ------------------------------------------------------------------- Aorta:  Aortic root: The aortic root was normal in size.  ------------------------------------------------------------------- Mitral valve:   Structurally normal valve.   Mobility was not restricted.  Doppler:  Transvalvular velocity was within the normal range. There was no evidence for stenosis. There was trivial regurgitation.    Peak gradient (D): 3 mm Hg.  ------------------------------------------------------------------- Left atrium:  The atrium was mildly dilated.  ------------------------------------------------------------------- Right ventricle:  The cavity size was normal. Systolic function was normal.  -------------------------------------------------------------------  Pulmonic valve:    Doppler:  Transvalvular velocity was within the normal range. There was no evidence for  stenosis.  ------------------------------------------------------------------- Tricuspid valve:   Structurally normal valve.    Doppler: Transvalvular velocity was within the normal range. There was trivial regurgitation.  ------------------------------------------------------------------- Right atrium:  The atrium was mildly dilated.  ------------------------------------------------------------------- Pericardium:  There was no pericardial effusion.  ------------------------------------------------------------------- Systemic veins: Inferior vena cava: The vessel was normal in size.  ------------------------------------------------------------------- Measurements   Left ventricle                           Value        Reference  LV ID, ED, PLAX chordal                  45.1  mm     43 - 52  LV ID, ES, PLAX chordal                  30.6  mm     23 - 38  LV fx shortening, PLAX chordal           32    %      >=29  LV PW thickness, ED                      10.3  mm     ---------  IVS/LV PW ratio, ED                      1.22         <=1.3  LV ejection fraction, 1-p A4C            54    %      ---------  LV end-diastolic volume, 2-p             115   ml     ---------  LV end-systolic volume, 2-p              61    ml     ---------  LV ejection fraction, 2-p                47    %      ---------  Stroke volume, 2-p                       54    ml     ---------  LV end-diastolic volume/bsa, 2-p         53    ml/m^2 ---------  LV end-systolic volume/bsa, 2-p          28    ml/m^2 ---------  Stroke volume/bsa, 2-p                   24.9  ml/m^2 ---------  LV e&', lateral                           6.2   cm/s   ---------  LV E/e&', lateral                         13.15        ---------  LV s&', lateral  4.7   cm/s   ---------  LV e&', medial                            3.7   cm/s   ---------  LV E/e&', medial                          22.03        ---------  LV  e&', average                           4.95  cm/s   ---------  LV E/e&', average                         16.46        ---------    Ventricular septum                       Value        Reference  IVS thickness, ED                        12.6  mm     ---------    LVOT                                     Value        Reference  LVOT ID, S                               19    mm     ---------  LVOT area                                2.84  cm^2   ---------    Aorta                                    Value        Reference  Aortic root ID, ED                       28    mm     ---------    Left atrium                              Value        Reference  LA ID, A-P, ES                           42    mm     ---------  LA ID/bsa, A-P                           1.92  cm/m^2 <=2.2  LA volume, S                             71.8  ml     ---------  LA volume/bsa, S                         32.9  ml/m^2 ---------  LA volume, ES, 1-p A4C                   58    ml     ---------  LA volume/bsa, ES, 1-p A4C               26.6  ml/m^2 ---------  LA volume, ES, 1-p A2C                   82.4  ml     ---------  LA volume/bsa, ES, 1-p A2C               37.7  ml/m^2 ---------    Mitral valve                             Value        Reference  Mitral E-wave peak velocity              81.5  cm/s   ---------  Mitral A-wave peak velocity              64    cm/s   ---------  Mitral deceleration time                 208   ms     150 - 230  Mitral peak gradient, D                  3     mm Hg  ---------  Mitral E/A ratio, peak                   1.3          ---------    Systemic veins                           Value        Reference  Estimated CVP                            3     mm Hg  ---------    Right ventricle                          Value        Reference  TAPSE                                    21.2  mm     ---------  RV s&', lateral, S                        9.3   cm/s   ---------  Legend: (L)   and  (H)  mark values outside specified reference range.  ------------------------------------------------------------------- Prepared and Electronically Authenticated by  Kirk Ruths 2017-07-21T12:26:36    Left Heart Cath and Coronary Angiography  Conclusion     Ost Cx to Dist Cx lesion, 65 %stenosed.  2nd Mrg lesion, 75 %stenosed.  Dist Cx lesion, 80 %stenosed.  Dist LAD lesion, 90 %stenosed.  Mid  LAD lesion, 80 %stenosed.  Prox LAD to Mid LAD lesion, 70 %stenosed.  1st Diag lesion, 75 %stenosed.  1st RPLB lesion, 100 %stenosed.  Prox RCA to Dist RCA lesion, 100 %stenosed.  LV end diastolic pressure is mildly elevated.    Severe diffuse three-vessel coronary disease in this patient with multiple prior stents.  Total occlusion of the right coronary within the ostial segment. Distal vessel fills by collaterals from the left coronary. The distal right coronary is small and may not be graftable.  Severe diffuse LAD disease with 70% proximal stenosis, 80% mid stenosis, and 90% apical stenosis. The first diagonal which is relatively small contains segmental 70-80% stenosis.  Heavily stented proximal to mid circumflex coronary artery with 50-70% in-stent restenosis, diffuse. Large second obtuse marginal with 75% proximal diffuse narrowing. 70-80% segmental narrowing in the mid circumflex after the origin of the second obtuse marginal.  Mildly elevated LV EDP. Recent echo demonstrating EF greater than 50%. Contrast LV gram not performed due to chronic kidney disease. Total contrast used was 80 cc.  Recommendations:   Intensify medical therapy if possible.  If limiting symptoms on good medication regimen, he will need to be referred for coronary bypass grafting consideration. The wife suggested he may prefer [facility] referral for CABG.   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 Intravenous sedation with Versed and fentanyl was administered. 1% Xylocaine was infiltrated to achieve local analgesia. A double wall stick with an angiocath was utilized to obtain intra-arterial access. The modified Seldinger technique was used to place a 85F " Slender" sheath in the right radial artery. Weight based heparin was administered. Coronary angiography was done using 5 F catheters. Right coronary angiography was performed with a JR4. Left ventricular hemodymic recordings and angiography was done using the JR 4 catheter and hand injection. Left coronary angiography was performed with a JL 3.5 cm.  Hemostasis was achieved using a pneumatic band.  During this procedure the patient is administered a total of Versed 1 mg and Fentanyl 50 mg to achieve and maintain moderate conscious sedation. The patient's heart rate, blood pressure, and oxygen saturation are monitored continuously during the procedure. The period of conscious sedation is 24 minutes, of which I was present face-to-face 100% of this time.   Estimated blood loss <50 mL.  During this procedure the patient was administered the following to achieve and maintain moderate conscious sedation: Versed 1 mg, Fentanyl 50 mcg, while the patient's heart rate, blood pressure, and oxygen saturation were continuously monitored. The period of conscious sedation was 24 minutes, of which I was present face-to-face 100% of this time.    Coronary Findings   Dominance: Co-dominant  Left Anterior Descending  Prox LAD to Mid LAD lesion, 70% stenosed. The lesion was previously treated.  Mid LAD lesion, 80% stenosed. The lesion was previously treated.  Dist LAD lesion, 90% stenosed.  First Diagonal Branch  1st Diag lesion, 75% stenosed.  Second Diagonal Branch  Vessel is small in size.  Left Circumflex  Ost Cx to Dist Cx lesion, 65% stenosed. The lesion  was previously treated.  Dist Cx lesion, 80% stenosed.  First Obtuse Marginal Branch  Vessel is small in size.  Second Obtuse Marginal Branch  2nd Mrg lesion, 75% stenosed.  Right Coronary Artery  Prox RCA to Dist RCA lesion, 100% stenosed. The lesion was  previously treated.  Right Posterior Descending Artery  Vessel is small in size. RPDA filled by collaterals from 1st Sept.  Right Posterior Atrioventricular Branch  Vessel is small in size.  First Right Posterolateral  Vessel is small in size. 1st RPLB filled by collaterals from 3rd Mrg.  1st RPLB lesion, 100% stenosed. The lesion was previously treated.  Wall Motion   LV gram was not performed due to stage III chronic kidney disease. Echocardiogram documented LVEF of 50% recently.        Left Heart   Left Ventricle LV end diastolic pressure is mildly elevated.    Coronary Diagrams   Diagnostic Diagram     Implants     No implant documentation for this case.  PACS Images   Show images for Cardiac catheterization   Link to Procedure Log   Procedure Log    Hemo Data   Flowsheet Row Most Recent Value  AO Systolic Pressure 297 mmHg  AO Diastolic Pressure 89 mmHg  AO Mean 989 mmHg  LV Systolic Pressure 211 mmHg  LV Diastolic Pressure -3 mmHg  LV EDP 17 mmHg  Arterial Occlusion Pressure Extended Systolic Pressure 941 mmHg  Arterial Occlusion Pressure Extended Diastolic Pressure 80 mmHg  Arterial Occlusion Pressure Extended Mean Pressure 111 mmHg  Left Ventricular Apex Extended Systolic Pressure 740 mmHg  Left Ventricular Apex Extended Diastolic Pressure 0 mmHg  Left Ventricular Apex Extended EDP Pressure 18 mmHg     Impression:  Patient has 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 He presents with symptoms of exertional chest discomfort consistent with unstable angina pectoris. He also complains of long-standing history of exertional fatigue that is likely multifactorial but progressive. I have personally reviewed the patient's most recent transthoracic echocardiogram and diagnostic cardiac catheterization. The patient has multiple wall motion abnormalities with significant hypokinesis or akinesis of the inferior wall and moderate left ventricular systolic dysfunction. Diagnostic cardiac catheterization confirmed the presence of severe three-vessel coronary artery disease. The patient appears to have adequate target vessels for possible surgical revascularization in most vascular territories, perhaps with the exception of the terminal branches of the chronically occluded right coronary artery. I agree the patient would likely benefit from surgical revascularization in terms of some degree of symptomatic improvement, decreased risk of future cardiac event, and likely improved long-term survival. He might benefit from concomitant maze procedure. Risks associated with surgery should be acceptably low but will clearly be elevated by the patient's age and numerous comorbid medical problems including chronic immunosuppression therapy for rheumatoid arthritis. I am most concerned by the patient's history of chronic depression and severely limited physical mobility and what impact this might play in his postoperative convalescence.   Plan:  I have discussed the indications, risks, and potential benefits of coronary artery bypass grafting with the patient, his son, and his wife in the office this afternoon. We directly reviewed images from his recent diagnostic cardiac catheterization and discuss treatment options at length including continued medical therapy versus surgical revascularization. Expectations for the patient's postoperative convalescence following  uncomplicated coronary artery bypass grafting were discussed and placed into perspective with regards to the patient's numerous comorbid medical problems and limited physical mobility. The possibility that the patient might need at least short-term placement and some type of rehabilitation facility was discussed. The indications, risks,  and potential benefits of concomitant maze procedure were reviewed. The patient is very interested in proceeding with surgery in the near future. His family is more reluctant and concerned about his ability to recover from surgery. We will send the patient for a formal physical therapy consultation to assess his current issues with mobility and somewhat limited strength. We will tentatively plan to proceed with surgery on 11/07/2016. The patient will return to our office in 2 weeks to review the results of his physical therapy assessment and discuss options further. All of their questions have been addressed.   I spent in excess of 90 minutes during the conduct of this office consultation and >50% of this time involved direct face-to-face encounter with the patient for counseling and/or coordination of their care.    Valentina Gu. [name], MD 10/19/2016 5:10 PM

## 2016-10-19 NOTE — Patient Instructions (Addendum)
Continue all previous medications without any changes at this time  Stop taking Eliquis and Methotrexate on Wednesday November 1st

## 2016-10-22 ENCOUNTER — Other Ambulatory Visit: Payer: Self-pay | Admitting: *Deleted

## 2016-10-22 ENCOUNTER — Ambulatory Visit: Payer: Medicare Other | Admitting: Cardiology

## 2016-10-22 DIAGNOSIS — I2510 Atherosclerotic heart disease of native coronary artery without angina pectoris: Secondary | ICD-10-CM

## 2016-10-24 ENCOUNTER — Ambulatory Visit: Payer: Medicare Other | Admitting: Cardiovascular Disease

## 2016-10-26 ENCOUNTER — Ambulatory Visit: Payer: Medicare Other | Admitting: Cardiovascular Disease

## 2016-10-26 ENCOUNTER — Ambulatory Visit: Payer: Medicare Other | Attending: Thoracic Surgery (Cardiothoracic Vascular Surgery) | Admitting: Physical Therapy

## 2016-10-26 ENCOUNTER — Encounter: Payer: Self-pay | Admitting: Physical Therapy

## 2016-10-26 DIAGNOSIS — R2689 Other abnormalities of gait and mobility: Secondary | ICD-10-CM | POA: Insufficient documentation

## 2016-10-26 DIAGNOSIS — R293 Abnormal posture: Secondary | ICD-10-CM | POA: Diagnosis not present

## 2016-10-26 NOTE — Therapy (Signed)
[place], [place], [address] Phone: [phone]   Fax:  [phone]  Physical Therapy Evaluation  Patient Details  Name: Julian Ross MRN: [id number] Date of Birth: 1946-02-22 Referring Provider: Dr. Darylene Price  Encounter Date: 10/26/2016      PT End of Session - 10/26/16 1043    Visit Number 1   PT Start Time 3419   PT Stop Time 1130   PT Time Calculation (min) 49 min      Past Medical History:  Diagnosis Date  . Anemia   . Anxiety state   . CAD (coronary artery disease)    16 prior stents (at [facility])  . Cardiomyopathy, ischemic   . Chronic lower back pain   . Chronic systolic CHF (congestive heart failure) ([facility])   . CKD (chronic kidney disease), stage III   . Depression   . Diabetes mellitus type 2 in obese ([facility])   . Fibromyalgia   . Hepatitis A    at age 34  . High cholesterol   . History of blood transfusion    "related to OR"  . Hypertension   . Hypokalemia   . MI (myocardial infarction) 1990; 1995; 1997  . OSA on CPAP   . Osteoarthritis   . Persistent atrial fibrillation ([facility]) 02/2016   occured in setting of pneumonia  . Rheumatoid arthritis of multiple sites with negative rheumatoid factor ([place])   . Septic shock ([facility]) 02/2016   in setting of severe pneumonia    Past Surgical History:  Procedure Laterality Date  . BACK SURGERY    . CARDIAC CATHETERIZATION N/A 10/03/2016   Procedure: Left Heart Cath and Coronary Angiography;  Surgeon: Belva Crome, MD;  Location: [facility];  Service: Cardiovascular;  Laterality: N/A;  . CARDIOVERSION N/A 06/04/2016   Procedure: CARDIOVERSION;  Surgeon: Skeet Latch, MD;  Location: [facility];  Service: Cardiovascular;  Laterality: N/A;  . CATARACT EXTRACTION W/ INTRAOCULAR LENS  IMPLANT, BILATERAL Bilateral 2000s  . CORONARY ANGIOPLASTY  early 47s  . CORONARY ANGIOPLASTY WITH STENT PLACEMENT     "I've got 15  stents" (07/19/2016)  . KNEE ARTHROSCOPY Left 1990s  . POSTERIOR LUMBAR FUSION  2015   L4-5-S1  . right knee surgery     age of 34  . TONSILLECTOMY AND ADENOIDECTOMY  1949    There were no vitals filed for this visit.       Subjective Assessment - 10/26/16 1044    Subjective Hospitalized in March for heart failure, episode of syncope in June in which he damaged R knee and foot. Pt feels he is currently limited by pain as well as shortness of breath and chest tightness. Least pain is in the AMs. Pt feels he has really slowed down in the last year as far as general household tasks.    Patient Stated Goals get stronger   Currently in Pain? Yes   Pain Score 4   by the end of day, around 7-8/10   Pain Location --  throughout majority of joints   Pain Descriptors / Indicators Constant   Pain Type Chronic pain   Aggravating Factors  gets worse through the day, over exertion   Pain Relieving Factors early in AM, Tylenol            [facility] PT Assessment - 10/26/16 0001      Assessment   Medical Diagnosis CAD, Afib   Referring Provider Dr. Darylene Price  Onset Date/Surgical Date 10/19/16     Precautions   Precautions None     Restrictions   Weight Bearing Restrictions No     Balance Screen   Has the patient fallen in the past 6 months No   Has the patient had a decrease in activity level because of a fear of falling?  No   Is the patient reluctant to leave their home because of a fear of falling?  No     Home Environment   Living Environment Private residence   Living Arrangements Spouse/significant other   Type of [place] Access Level entry   [place] One level     Prior Function   Level of Independence Independent with community mobility without device     Posture/Postural Control   Posture/Postural Control Postural limitations   Postural Limitations Rounded Shoulders;Forward head     ROM / Strength   AROM / PROM / Strength AROM;Strength      AROM   Overall AROM Comments grossly WFL except LUE shoulder flexion and IR limited by approximately 25% due to old injury     Strength   Overall Strength Comments grossly 4/5 in lower extremities, grossly 5/5 in UE except L shoulder 4/5   Strength Assessment Site Hand   Right/Left hand Right;Left   Right Hand Grip (lbs) 54  R hand dominant   Left Hand Grip (lbs) 60     Ambulation/Gait   Gait Comments Pt ambulates with stiffness in hip and knee joint as well as excessive lateral trunk sway bilaterally.           [facility] Pre-Surgical Assessment - 10/26/16 0001    5 Meter Walk Test- trial 1 5 sec   5 Meter Walk Test- trial 2 5 sec.    5 Meter Walk Test- trial 3 5 sec.   5 meter walk test average 5 sec   4 Stage Balance Test tolerated for:  2 sec.   4 Stage Balance Test Position 4   Sit To Stand Test- trial 1 28 sec.   ADL/IADL Independent with: Bathing;Dressing;Meal prep;Finances   ADL/IADL Needs Assistance with: Yard work   6 Minute Walk- Baseline yes   BP (mmHg) 144/86   HR (bpm) 87   02 Sat (%RA) 97 %   Modified Borg Scale for Dyspnea 1- Very mild shortness of breath  reports some congestion this AM, unusual at rest   Perceived Rate of Exertion (Borg) 6-   6 Minute Walk Post Test yes   BP (mmHg) (!)  186/96  156/90 after 5 minutes   HR (bpm) 145   02 Sat (%RA) 93 %   Modified Borg Scale for Dyspnea 3- Moderate shortness of breath or breathing difficulty   Perceived Rate of Exertion (Borg) 13- Somewhat hard   Aerobic Endurance Distance Walked 955   Endurance additional comments no seated rest brealks required. Distance is 55% of normal distance for age/gender.                                     Plan - 10/26/16 1309    Clinical Impression Statement see below   PT Frequency One time visit   Consulted and Agree with Plan of Care Patient     Clinical Impression Statement: Pt is a 70 yo male presenting to OP PT for evaluation with primary  diagnoses of CAD and Afib.  Pt reports hx of chronic pain and fatigue and reports his mobility has really declined over the last year. Pt has also noticed chest tightness with activity. Pt presents with good ROM and strength, good balance and is not at high fall risk 4 stage balance test, good walking speed and poor aerobic endurance per 6 minute walk test. Pt ambulated 955 feet in 6 min walk. Pt reported 3/10 shortness of breath on modified scale for dyspnea.  Pt's systolic BP increased significantly with 6 minute walk test. Based on the Short Physical Performance Battery, patient has a frailty rating of 9/12 with </= 5/12 considered frail.   Patient demonstrated the following deficits and impairments:     Visit Diagnosis: Other abnormalities of gait and mobility - Plan: PT plan of care cert/re-cert  Abnormal posture - Plan: PT plan of care cert/re-cert      G-Codes - 02/58/52 1310    Functional Assessment Tool Used 6 minute walk 955'   Functional Limitation Mobility: Walking and moving around   Mobility: Walking and Moving Around Current Status [phone]) At least 40 percent but less than 60 percent impaired, limited or restricted   Mobility: Walking and Moving Around Goal Status [phone]) At least 40 percent but less than 60 percent impaired, limited or restricted   Mobility: Walking and Moving Around Discharge Status [phone]) At least 40 percent but less than 60 percent impaired, limited or restricted       Problem List Patient Active Problem List   Diagnosis Date Noted  . Angina pectoris, crescendo ([facility]) 10/03/2016  . Knee effusion   . Syncope 07/20/2016  . Syncope and collapse 07/19/2016  . Chronic combined systolic and diastolic CHF (congestive heart failure) ([place]) 07/19/2016  . CKD (chronic kidney disease) stage 3, GFR 30-59 ml/min 07/19/2016  . Leukocytosis 07/19/2016  . Anemia of chronic kidney failure 07/19/2016  . Uncontrolled diabetes mellitus with diabetic nephropathy, with  long-term current use of insulin ([place]) 07/19/2016  . Benign essential HTN 07/19/2016  . Dyslipidemia associated with type 2 diabetes mellitus ([place]) 07/19/2016  . Paroxysmal atrial fibrillation ([facility])   . Coronary artery disease of native artery of native heart with stable angina pectoris ([place]) 04/16/2016  . Rheumatoid arthritis of multiple sites with negative rheumatoid factor ([place])     [place], PT 10/26/2016, 1:17 PM  [facility] [address] [place], [place], [address] Phone: [phone]   Fax:  [phone]  Name: Liston Thum MRN: [id number] Date of Birth: 18-Oct-1946

## 2016-10-29 ENCOUNTER — Encounter: Payer: Self-pay | Admitting: Thoracic Surgery (Cardiothoracic Vascular Surgery)

## 2016-10-29 ENCOUNTER — Encounter: Payer: Medicare Other | Admitting: Thoracic Surgery (Cardiothoracic Vascular Surgery)

## 2016-10-29 ENCOUNTER — Ambulatory Visit (INDEPENDENT_AMBULATORY_CARE_PROVIDER_SITE_OTHER): Payer: Medicare Other | Admitting: Thoracic Surgery (Cardiothoracic Vascular Surgery)

## 2016-10-29 VITALS — BP 179/102 | HR 110 | Resp 20 | Ht 71.0 in | Wt 220.0 lb

## 2016-10-29 DIAGNOSIS — I2510 Atherosclerotic heart disease of native coronary artery without angina pectoris: Secondary | ICD-10-CM | POA: Diagnosis not present

## 2016-10-29 DIAGNOSIS — I480 Paroxysmal atrial fibrillation: Secondary | ICD-10-CM | POA: Diagnosis not present

## 2016-10-29 DIAGNOSIS — I255 Ischemic cardiomyopathy: Secondary | ICD-10-CM | POA: Diagnosis not present

## 2016-10-29 DIAGNOSIS — I5042 Chronic combined systolic (congestive) and diastolic (congestive) heart failure: Secondary | ICD-10-CM | POA: Diagnosis not present

## 2016-10-29 NOTE — Progress Notes (Signed)
[address]       [place] [address]             [phone]     CARDIOTHORACIC SURGERY OFFICE NOTE  Referring Provider is Skeet Latch, MD PCP is Kandice Hams, MD   HPI:  Patient returns to the office today for follow-up of three-vessel coronary artery disease with ischemic cardiomyopathy, chronic combined systolic and diastolic congestive heart failure, and recurrent paroxysmal atrial fibrillation. He was originally seen in consultation on 10/19/2016. Since then he underwent a formal physical therapy evaluation 10/26/2016. He returns to the office today with his wife to discuss treatment options further. The patient states that over the last week he has developed an upper respiratory tract infection with sinus congestion, cough, chills without fever, and generalized malaise. He has not had productive cough. He has not had any fevers. He states that his blood pressure has been up and down a fair amount. Yesterday it was low and he felt dizzy so he cut back his dose of carvedilol this morning.  His wife also notes that his blood sugars have been up and down the last few days. This has been attributed to inconsistent oral intake with his current illness. He has not had any chest pain or chest tightness recently consistent with angina pectoris. He has not had any palpitations or dizzy spells. Shortness of breath is unchanged.   Current Outpatient Prescriptions  Medication Sig Dispense Refill  . acetaminophen (TYLENOL) 325 MG tablet Take 650 mg by mouth every 4 (four) hours as needed for mild pain or headache.    . ALPRAZolam (NIRAVAM) 0.5 MG dissolvable tablet Take 1 tablet by mouth as needed (Take as directed).   2  . apixaban (ELIQUIS) 5 MG TABS tablet Take 1 tablet (5 mg total) by mouth 2 (two) times daily. 60 tablet   . carvedilol (COREG) 3.125 MG tablet Take 3.125 mg by mouth 2 (two) times daily with a meal.    . fenofibrate (TRICOR) 145 MG tablet Take 145  mg by mouth daily.    . folic acid (FOLVITE) 1 MG tablet Take 1 mg by mouth daily.    . furosemide (LASIX) 20 MG tablet Take 20 mg by mouth 2 (two) times daily.    Marland Kitchen gabapentin (NEURONTIN) 300 MG capsule Take 300 mg by mouth 3 (three) times daily.   2  . hydrocortisone (ANUSOL-HC) 2.5 % rectal cream Place 1 application rectally 2 (two) times daily as needed for hemorrhoids or itching. 30 g 0  . insulin lispro protamine-lispro (HUMALOG 50/50 MIX) (50-50) 100 UNIT/ML SUSP injection Inject 40-50 Units into the skin 3 (three) times daily. 50, 40, 50    . leucovorin (WELLCOVORIN) 10 MG tablet Take 10 mg by mouth See admin instructions. Take 1 tablet (10 mg) by mouth every 12 hours and 24 hours after the methotrexate dose    . LUMIGAN 0.01 % SOLN Place 1 drop into both eyes daily.  0  . magnesium oxide (MAG-OX) 400 (241.3 Mg) MG tablet Take 1 tablet (400 mg total) by mouth 2 (two) times daily. 60 tablet 0  . Methotrexate Sodium (METHOTREXATE, PF,) 50 MG/2ML injection Inject 15 mg into the muscle once a week. Inject 0.8 ml (20 mg) - on Fridays.  Must be given the same time every administration  0  . Misc Natural Products (OSTEO BI-FLEX ADV DOUBLE ST PO) Take 1 tablet by mouth 2 (two) times daily.    Marland Kitchen  omega-3 acid ethyl esters (LOVAZA) 1 g capsule Take 1 capsule by mouth 2 (two) times daily.  11  . omeprazole (PRILOSEC) 20 MG capsule Take 20 mg by mouth daily as needed (heartburn or acid reflux).    Marland Kitchen PARoxetine (PAXIL) 40 MG tablet Take 40 mg by mouth daily.   11  . potassium chloride SA (K-DUR,KLOR-CON) 20 MEQ tablet Take 20 mEq by mouth 4 (four) times daily as needed (potassium supplement).    . predniSONE (DELTASONE) 5 MG tablet Take 5 mg by mouth daily with breakfast.    . ranolazine (RANEXA) 500 MG 12 hr tablet Take 1 tablet (500 mg total) by mouth 2 (two) times daily. 180 tablet 1  . tamsulosin (FLOMAX) 0.4 MG CAPS capsule Take 0.4 mg by mouth daily.   4  . tiZANidine (ZANAFLEX) 4 MG tablet Take 1  tablet by mouth every 8 (eight) hours as needed for muscle spasms.   2  . topiramate (TOPAMAX) 25 MG tablet Take 25 mg by mouth 2 (two) times daily.    . traMADol (ULTRAM) 50 MG tablet Take 1 tablet by mouth 3 (three) times daily as needed (pain). Take as directed  2  . VITAMIN D, ERGOCALCIFEROL, PO Take 5,000 Units by mouth daily.     Marland Kitchen ZETIA 10 MG tablet Take 10 mg by mouth daily.   11   No current facility-administered medications for this visit.       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 He presents with symptoms of exertional chest discomfort consistent with unstable angina pectoris. He also complains of long-standing history of exertional fatigue that is likely multifactorial but progressive. I have personally reviewed the patient's most recent transthoracic echocardiogram and diagnostic cardiac catheterization. The patient has multiple wall motion abnormalities with significant hypokinesis or akinesis of the inferior wall and moderate left ventricular systolic dysfunction. Diagnostic cardiac catheterization confirmed the presence of severe three-vessel coronary artery disease. The patient appears to have adequate target vessels for possible surgical revascularization in most  vascular territories, perhaps with the exception of the terminal branches of the chronically occluded right coronary artery. I agree the patient would likely benefit from surgical revascularization in terms of some degree of symptomatic improvement, decreased risk of future cardiac event, and likely improved long-term survival. He might benefit from concomitant maze procedure. Risks associated with surgery should be acceptably low but will clearly be elevated by the patient's age and numerous comorbid medical problems including chronic immunosuppression therapy for rheumatoid arthritis. I am most concerned by the patient's history of chronic depression and limited physical mobility and what impact this might play in his postoperative convalescence.  However, the patient did respectably well with his recent formal physical therapy evaluation. Unfortunately, he has  now developed symptoms consistent with an upper respiratory tract infection. In addition, his blood pressure is quite high today, although he cut back his dose of carvedilol this morning because his blood pressure was reportedly low yesterday.  Plan:  I have discussed matters at length with the patient and his wife in the office today. We have decided to postpone plans for surgery for an additional week or 2 to allow the patient to recover from his current upper respiratory tract infection. At this point we now tentatively plan to proceed with surgery on Friday, 11/16/2016. The patient will return to our office on Monday, 11/12/2016 prior to surgery. He has been instructed to stop taking Eliqius on Friday, 11/09/2016 in anticipation of surgery. He will continue to take over-the-counter expectorants and decongestants to treat his current upper respiratory tract infection. Symptoms don't improve or if he developed significant fever he will see his primary care physician. In addition, the patient will continue to monitor his blood pressure regularly and  test is with Dr. Blenda Mounts office if his blood pressure remains elevated or if he develops recurrent episodes of dizzy spells with low blood pressure. Finally, the patient and his wife request to speak with a case manager to explore options prior to surgery, with the anticipation that he will likely need temporary rehabilitation and some type of skilled nursing facility when he is recovering from his surgery. All of their questions have been addressed.  I spent in excess of 15 minutes during the conduct of this office consultation and >50% of this time involved direct face-to-face encounter with the patient for counseling and/or coordination of their care.    Valentina Gu. [name], MD 10/29/2016 12:13 PM

## 2016-10-29 NOTE — Patient Instructions (Addendum)
Continue all previous medications without any changes at this time  Continue to monitor your blood pressure daily and contact Dr Blenda Mounts office if you have more dizzy spells or blood pressure > 607 mmHg systolic  Contact your primary care physician if your current symptoms of cough and congestion don't improve or if you develop significant fever > 101 degrees  Stop taking Eliquis on Friday 11/09/2016 in anticipation of your surgery

## 2016-10-30 ENCOUNTER — Encounter (HOSPITAL_COMMUNITY): Payer: Self-pay | Admitting: Emergency Medicine

## 2016-10-30 ENCOUNTER — Emergency Department (HOSPITAL_COMMUNITY): Payer: Medicare Other

## 2016-10-30 ENCOUNTER — Inpatient Hospital Stay (HOSPITAL_COMMUNITY)
Admission: EM | Admit: 2016-10-30 | Discharge: 2016-11-04 | DRG: 871 | Disposition: A | Discharge status: Home / Self Care | Payer: Medicare Other | Attending: Family Medicine | Admitting: Family Medicine

## 2016-10-30 DIAGNOSIS — J9600 Acute respiratory failure, unspecified whether with hypoxia or hypercapnia: Secondary | ICD-10-CM

## 2016-10-30 DIAGNOSIS — J9601 Acute respiratory failure with hypoxia: Secondary | ICD-10-CM | POA: Diagnosis present

## 2016-10-30 DIAGNOSIS — Z22322 Carrier or suspected carrier of Methicillin resistant Staphylococcus aureus: Secondary | ICD-10-CM

## 2016-10-30 DIAGNOSIS — E785 Hyperlipidemia, unspecified: Secondary | ICD-10-CM | POA: Diagnosis present

## 2016-10-30 DIAGNOSIS — E119 Type 2 diabetes mellitus without complications: Secondary | ICD-10-CM | POA: Diagnosis not present

## 2016-10-30 DIAGNOSIS — J189 Pneumonia, unspecified organism: Secondary | ICD-10-CM | POA: Diagnosis present

## 2016-10-30 DIAGNOSIS — Z8249 Family history of ischemic heart disease and other diseases of the circulatory system: Secondary | ICD-10-CM

## 2016-10-30 DIAGNOSIS — I480 Paroxysmal atrial fibrillation: Secondary | ICD-10-CM | POA: Diagnosis present

## 2016-10-30 DIAGNOSIS — N189 Chronic kidney disease, unspecified: Secondary | ICD-10-CM

## 2016-10-30 DIAGNOSIS — I25118 Atherosclerotic heart disease of native coronary artery with other forms of angina pectoris: Secondary | ICD-10-CM | POA: Diagnosis present

## 2016-10-30 DIAGNOSIS — I5042 Chronic combined systolic (congestive) and diastolic (congestive) heart failure: Secondary | ICD-10-CM | POA: Diagnosis present

## 2016-10-30 DIAGNOSIS — E1165 Type 2 diabetes mellitus with hyperglycemia: Secondary | ICD-10-CM | POA: Diagnosis present

## 2016-10-30 DIAGNOSIS — M797 Fibromyalgia: Secondary | ICD-10-CM | POA: Diagnosis present

## 2016-10-30 DIAGNOSIS — R0602 Shortness of breath: Secondary | ICD-10-CM | POA: Diagnosis not present

## 2016-10-30 DIAGNOSIS — I10 Essential (primary) hypertension: Secondary | ICD-10-CM

## 2016-10-30 DIAGNOSIS — I509 Heart failure, unspecified: Secondary | ICD-10-CM | POA: Diagnosis not present

## 2016-10-30 DIAGNOSIS — G9340 Encephalopathy, unspecified: Secondary | ICD-10-CM | POA: Diagnosis present

## 2016-10-30 DIAGNOSIS — I130 Hypertensive heart and chronic kidney disease with heart failure and stage 1 through stage 4 chronic kidney disease, or unspecified chronic kidney disease: Secondary | ICD-10-CM | POA: Diagnosis present

## 2016-10-30 DIAGNOSIS — Z9861 Coronary angioplasty status: Secondary | ICD-10-CM

## 2016-10-30 DIAGNOSIS — I252 Old myocardial infarction: Secondary | ICD-10-CM

## 2016-10-30 DIAGNOSIS — E1142 Type 2 diabetes mellitus with diabetic polyneuropathy: Secondary | ICD-10-CM | POA: Diagnosis present

## 2016-10-30 DIAGNOSIS — Z880 Allergy status to penicillin: Secondary | ICD-10-CM

## 2016-10-30 DIAGNOSIS — Z794 Long term (current) use of insulin: Secondary | ICD-10-CM

## 2016-10-30 DIAGNOSIS — E871 Hypo-osmolality and hyponatremia: Secondary | ICD-10-CM | POA: Diagnosis present

## 2016-10-30 DIAGNOSIS — G4733 Obstructive sleep apnea (adult) (pediatric): Secondary | ICD-10-CM | POA: Diagnosis present

## 2016-10-30 DIAGNOSIS — Z9989 Dependence on other enabling machines and devices: Secondary | ICD-10-CM

## 2016-10-30 DIAGNOSIS — N183 Chronic kidney disease, stage 3 unspecified: Secondary | ICD-10-CM | POA: Diagnosis present

## 2016-10-30 DIAGNOSIS — Y95 Nosocomial condition: Secondary | ICD-10-CM | POA: Diagnosis present

## 2016-10-30 DIAGNOSIS — Z888 Allergy status to other drugs, medicaments and biological substances status: Secondary | ICD-10-CM

## 2016-10-30 DIAGNOSIS — M0600 Rheumatoid arthritis without rheumatoid factor, unspecified site: Secondary | ICD-10-CM | POA: Diagnosis present

## 2016-10-30 DIAGNOSIS — F329 Major depressive disorder, single episode, unspecified: Secondary | ICD-10-CM | POA: Diagnosis present

## 2016-10-30 DIAGNOSIS — E1122 Type 2 diabetes mellitus with diabetic chronic kidney disease: Secondary | ICD-10-CM | POA: Diagnosis present

## 2016-10-30 DIAGNOSIS — N400 Enlarged prostate without lower urinary tract symptoms: Secondary | ICD-10-CM | POA: Diagnosis present

## 2016-10-30 DIAGNOSIS — M0609 Rheumatoid arthritis without rheumatoid factor, multiple sites: Secondary | ICD-10-CM

## 2016-10-30 DIAGNOSIS — Z91041 Radiographic dye allergy status: Secondary | ICD-10-CM

## 2016-10-30 DIAGNOSIS — R0603 Acute respiratory distress: Secondary | ICD-10-CM

## 2016-10-30 DIAGNOSIS — I2510 Atherosclerotic heart disease of native coronary artery without angina pectoris: Secondary | ICD-10-CM | POA: Diagnosis present

## 2016-10-30 DIAGNOSIS — I248 Other forms of acute ischemic heart disease: Secondary | ICD-10-CM | POA: Diagnosis present

## 2016-10-30 DIAGNOSIS — E1169 Type 2 diabetes mellitus with other specified complication: Secondary | ICD-10-CM

## 2016-10-30 DIAGNOSIS — D631 Anemia in chronic kidney disease: Secondary | ICD-10-CM | POA: Diagnosis present

## 2016-10-30 DIAGNOSIS — J181 Lobar pneumonia, unspecified organism: Secondary | ICD-10-CM

## 2016-10-30 DIAGNOSIS — J9690 Respiratory failure, unspecified, unspecified whether with hypoxia or hypercapnia: Secondary | ICD-10-CM | POA: Diagnosis not present

## 2016-10-30 DIAGNOSIS — I255 Ischemic cardiomyopathy: Secondary | ICD-10-CM | POA: Diagnosis present

## 2016-10-30 DIAGNOSIS — Z955 Presence of coronary angioplasty implant and graft: Secondary | ICD-10-CM

## 2016-10-30 DIAGNOSIS — F411 Generalized anxiety disorder: Secondary | ICD-10-CM | POA: Diagnosis present

## 2016-10-30 DIAGNOSIS — Z66 Do not resuscitate: Secondary | ICD-10-CM | POA: Diagnosis present

## 2016-10-30 DIAGNOSIS — Z79899 Other long term (current) drug therapy: Secondary | ICD-10-CM

## 2016-10-30 DIAGNOSIS — Z7901 Long term (current) use of anticoagulants: Secondary | ICD-10-CM

## 2016-10-30 DIAGNOSIS — A419 Sepsis, unspecified organism: Secondary | ICD-10-CM | POA: Diagnosis not present

## 2016-10-30 DIAGNOSIS — Z981 Arthrodesis status: Secondary | ICD-10-CM

## 2016-10-30 LAB — DIFFERENTIAL
BASOS ABS: 0 10*3/uL (ref 0.0–0.1)
Basophils Relative: 0 %
EOS ABS: 0 10*3/uL (ref 0.0–0.7)
Eosinophils Relative: 0 %
LYMPHS ABS: 1.5 10*3/uL (ref 0.7–4.0)
Lymphocytes Relative: 10 %
MONO ABS: 0.9 10*3/uL (ref 0.1–1.0)
MONOS PCT: 6 %
NEUTROS PCT: 84 %
Neutro Abs: 12.5 10*3/uL — ABNORMAL HIGH (ref 1.7–7.7)

## 2016-10-30 LAB — I-STAT ARTERIAL BLOOD GAS, ED
BICARBONATE: 23.2 mmol/L (ref 20.0–28.0)
O2 SAT: 94 %
TCO2: 24 mmol/L (ref 0–100)
pCO2 arterial: 32.1 mmHg (ref 32.0–48.0)
pH, Arterial: 7.466 — ABNORMAL HIGH (ref 7.350–7.450)
pO2, Arterial: 65 mmHg — ABNORMAL LOW (ref 83.0–108.0)

## 2016-10-30 LAB — TROPONIN I
Troponin I: 0.13 ng/mL (ref <0.03)
Troponin I: 0.11 ng/mL (ref <0.03)

## 2016-10-30 LAB — I-STAT CG4 LACTIC ACID, ED: LACTIC ACID, VENOUS: 2.59 mmol/L — AB (ref 0.5–1.9)

## 2016-10-30 LAB — CBG MONITORING, ED
GLUCOSE-CAPILLARY: 160 mg/dL — AB (ref 65–99)
Glucose-Capillary: 167 mg/dL — ABNORMAL HIGH (ref 65–99)

## 2016-10-30 LAB — COMPREHENSIVE METABOLIC PANEL
ALBUMIN: 3.4 g/dL — AB (ref 3.5–5.0)
ALK PHOS: 37 U/L — AB (ref 38–126)
ALT: 26 U/L (ref 17–63)
ANION GAP: 11 (ref 5–15)
AST: 74 U/L — ABNORMAL HIGH (ref 15–41)
BUN: 22 mg/dL — ABNORMAL HIGH (ref 6–20)
CHLORIDE: 93 mmol/L — AB (ref 101–111)
CO2: 26 mmol/L (ref 22–32)
Calcium: 9 mg/dL (ref 8.9–10.3)
Creatinine, Ser: 1.84 mg/dL — ABNORMAL HIGH (ref 0.61–1.24)
GFR calc Af Amer: 41 mL/min — ABNORMAL LOW (ref >60)
GFR calc non Af Amer: 35 mL/min — ABNORMAL LOW (ref >60)
GLUCOSE: 225 mg/dL — AB (ref 65–99)
POTASSIUM: 4 mmol/L (ref 3.5–5.1)
SODIUM: 130 mmol/L — AB (ref 135–145)
Total Bilirubin: 0.8 mg/dL (ref 0.3–1.2)
Total Protein: 7.2 g/dL (ref 6.5–8.1)

## 2016-10-30 LAB — PROCALCITONIN: Procalcitonin: 0.37 ng/mL

## 2016-10-30 LAB — CBC
HCT: 36.4 % — ABNORMAL LOW (ref 39.0–52.0)
HEMOGLOBIN: 12.3 g/dL — AB (ref 13.0–17.0)
MCH: 31.1 pg (ref 26.0–34.0)
MCHC: 33.8 g/dL (ref 30.0–36.0)
MCV: 91.9 fL (ref 78.0–100.0)
Platelets: 212 10*3/uL (ref 150–400)
RBC: 3.96 MIL/uL — ABNORMAL LOW (ref 4.22–5.81)
RDW: 14 % (ref 11.5–15.5)
WBC: 14.9 10*3/uL — ABNORMAL HIGH (ref 4.0–10.5)

## 2016-10-30 LAB — CBC WITH DIFFERENTIAL/PLATELET
BASOS PCT: 0 %
Basophils Absolute: 0 10*3/uL (ref 0.0–0.1)
EOS PCT: 0 %
Eosinophils Absolute: 0 10*3/uL (ref 0.0–0.7)
HEMATOCRIT: 38.8 % — AB (ref 39.0–52.0)
HEMOGLOBIN: 13.3 g/dL (ref 13.0–17.0)
LYMPHS PCT: 6 %
Lymphs Abs: 1 10*3/uL (ref 0.7–4.0)
MCH: 31.3 pg (ref 26.0–34.0)
MCHC: 34.3 g/dL (ref 30.0–36.0)
MCV: 91.3 fL (ref 78.0–100.0)
MONO ABS: 2.3 10*3/uL — AB (ref 0.1–1.0)
MONOS PCT: 14 %
NEUTROS PCT: 80 %
Neutro Abs: 12.8 10*3/uL — ABNORMAL HIGH (ref 1.7–7.7)
PLATELETS: 214 10*3/uL (ref 150–400)
RBC: 4.25 MIL/uL (ref 4.22–5.81)
RDW: 14 % (ref 11.5–15.5)
WBC: 16.1 10*3/uL — AB (ref 4.0–10.5)

## 2016-10-30 LAB — I-STAT CHEM 8, ED
BUN: 25 mg/dL — ABNORMAL HIGH (ref 6–20)
CALCIUM ION: 1.06 mmol/L — AB (ref 1.15–1.40)
Chloride: 92 mmol/L — ABNORMAL LOW (ref 101–111)
Creatinine, Ser: 1.7 mg/dL — ABNORMAL HIGH (ref 0.61–1.24)
GLUCOSE: 215 mg/dL — AB (ref 65–99)
HCT: 39 % (ref 39.0–52.0)
Hemoglobin: 13.3 g/dL (ref 13.0–17.0)
Potassium: 4.1 mmol/L (ref 3.5–5.1)
SODIUM: 129 mmol/L — AB (ref 135–145)
TCO2: 25 mmol/L (ref 0–100)

## 2016-10-30 LAB — GLUCOSE, CAPILLARY
GLUCOSE-CAPILLARY: 151 mg/dL — AB (ref 65–99)
GLUCOSE-CAPILLARY: 164 mg/dL — AB (ref 65–99)

## 2016-10-30 LAB — I-STAT TROPONIN, ED: Troponin i, poc: 0.21 ng/mL (ref 0.00–0.08)

## 2016-10-30 LAB — MRSA PCR SCREENING: MRSA by PCR: POSITIVE — AB

## 2016-10-30 LAB — LACTIC ACID, PLASMA: Lactic Acid, Venous: 1.4 mmol/L (ref 0.5–1.9)

## 2016-10-30 LAB — PROTIME-INR
INR: 1.75
Prothrombin Time: 20.6 seconds — ABNORMAL HIGH (ref 11.4–15.2)

## 2016-10-30 LAB — APTT: APTT: 48 s — AB (ref 24–36)

## 2016-10-30 MED ORDER — VANCOMYCIN HCL 10 G IV SOLR
2000.0000 mg | Freq: Once | INTRAVENOUS | Status: AC
  Administered 2016-10-30: 2000 mg via INTRAVENOUS
  Filled 2016-10-30: qty 2000

## 2016-10-30 MED ORDER — PANTOPRAZOLE SODIUM 40 MG PO TBEC
40.0000 mg | DELAYED_RELEASE_TABLET | Freq: Every day | ORAL | Status: DC
Start: 2016-10-30 — End: 2016-11-04
  Administered 2016-10-30 – 2016-11-04 (×6): 40 mg via ORAL
  Filled 2016-10-30 (×6): qty 1

## 2016-10-30 MED ORDER — INSULIN ASPART PROT & ASPART (70-30 MIX) 100 UNIT/ML ~~LOC~~ SUSP
40.0000 [IU] | Freq: Every day | SUBCUTANEOUS | Status: DC
  Filled 2016-10-30: qty 10

## 2016-10-30 MED ORDER — GABAPENTIN 300 MG PO CAPS
300.0000 mg | ORAL_CAPSULE | Freq: Three times a day (TID) | ORAL | Status: DC
  Administered 2016-10-30 – 2016-11-04 (×15): 300 mg via ORAL
  Filled 2016-10-30 (×15): qty 1

## 2016-10-30 MED ORDER — VANCOMYCIN HCL IN DEXTROSE 750-5 MG/150ML-% IV SOLN
750.0000 mg | Freq: Two times a day (BID) | INTRAVENOUS | Status: DC
  Administered 2016-10-31 – 2016-11-03 (×7): 750 mg via INTRAVENOUS
  Filled 2016-10-30 (×8): qty 150

## 2016-10-30 MED ORDER — TAMSULOSIN HCL 0.4 MG PO CAPS
0.4000 mg | ORAL_CAPSULE | Freq: Every day | ORAL | Status: DC
  Administered 2016-10-30 – 2016-11-04 (×6): 0.4 mg via ORAL
  Filled 2016-10-30 (×6): qty 1

## 2016-10-30 MED ORDER — CARVEDILOL 3.125 MG PO TABS
3.1250 mg | ORAL_TABLET | Freq: Two times a day (BID) | ORAL | Status: DC
  Administered 2016-10-30 – 2016-11-02 (×7): 3.125 mg via ORAL
  Filled 2016-10-30 (×7): qty 1

## 2016-10-30 MED ORDER — INSULIN ASPART PROT & ASPART (70-30 MIX) 100 UNIT/ML ~~LOC~~ SUSP
30.0000 [IU] | Freq: Every day | SUBCUTANEOUS | Status: DC
  Filled 2016-10-30: qty 10

## 2016-10-30 MED ORDER — OMEGA-3-ACID ETHYL ESTERS 1 G PO CAPS
1.0000 | ORAL_CAPSULE | Freq: Two times a day (BID) | ORAL | Status: DC
  Administered 2016-10-30 – 2016-11-04 (×10): 1 g via ORAL
  Filled 2016-10-30 (×10): qty 1

## 2016-10-30 MED ORDER — TIZANIDINE HCL 4 MG PO TABS
4.0000 mg | ORAL_TABLET | Freq: Three times a day (TID) | ORAL | Status: DC | PRN
  Administered 2016-10-31 – 2016-11-01 (×2): 4 mg via ORAL
  Filled 2016-10-30 (×3): qty 1

## 2016-10-30 MED ORDER — DEXTROSE 5 % IV SOLN
2.0000 g | Freq: Three times a day (TID) | INTRAVENOUS | Status: DC
  Administered 2016-10-30 – 2016-11-03 (×12): 2 g via INTRAVENOUS
  Filled 2016-10-30 (×16): qty 2

## 2016-10-30 MED ORDER — PREDNISONE 5 MG PO TABS
5.0000 mg | ORAL_TABLET | Freq: Every day | ORAL | Status: DC
  Administered 2016-10-31: 5 mg via ORAL
  Filled 2016-10-30: qty 1

## 2016-10-30 MED ORDER — INSULIN ASPART PROT & ASPART (70-30 MIX) 100 UNIT/ML ~~LOC~~ SUSP
40.0000 [IU] | Freq: Every day | SUBCUTANEOUS | Status: DC
  Administered 2016-10-31: 40 [IU] via SUBCUTANEOUS
  Filled 2016-10-30: qty 10

## 2016-10-30 MED ORDER — APIXABAN 5 MG PO TABS
5.0000 mg | ORAL_TABLET | Freq: Two times a day (BID) | ORAL | Status: DC
  Administered 2016-10-30 – 2016-11-04 (×10): 5 mg via ORAL
  Filled 2016-10-30 (×10): qty 1

## 2016-10-30 MED ORDER — SODIUM CHLORIDE 0.9% FLUSH
3.0000 mL | Freq: Two times a day (BID) | INTRAVENOUS | Status: DC
  Administered 2016-10-30 – 2016-11-04 (×9): 3 mL via INTRAVENOUS

## 2016-10-30 MED ORDER — ACETAMINOPHEN 325 MG PO TABS: 650.0000 mg | ORAL_TABLET | Freq: Four times a day (QID) | ORAL | Status: DC | PRN

## 2016-10-30 MED ORDER — ALPRAZOLAM 0.5 MG PO TBDP: 0.5000 mg | ORAL_TABLET | ORAL | Status: DC | PRN

## 2016-10-30 MED ORDER — FENOFIBRATE 160 MG PO TABS
160.0000 mg | ORAL_TABLET | Freq: Every day | ORAL | Status: DC
  Administered 2016-10-31 – 2016-11-04 (×5): 160 mg via ORAL
  Filled 2016-10-30 (×5): qty 1

## 2016-10-30 MED ORDER — ALPRAZOLAM 0.5 MG PO TABS
0.5000 mg | ORAL_TABLET | Freq: Two times a day (BID) | ORAL | Status: DC | PRN
  Administered 2016-10-30 – 2016-10-31 (×2): 0.5 mg via ORAL
  Filled 2016-10-30 (×2): qty 1

## 2016-10-30 MED ORDER — SODIUM CHLORIDE 0.9 % IV SOLN
INTRAVENOUS | Status: DC
  Administered 2016-10-30: 14:00:00 via INTRAVENOUS

## 2016-10-30 MED ORDER — EZETIMIBE 10 MG PO TABS
10.0000 mg | ORAL_TABLET | Freq: Every day | ORAL | Status: DC
  Administered 2016-10-31 – 2016-11-04 (×5): 10 mg via ORAL
  Filled 2016-10-30 (×5): qty 1

## 2016-10-30 MED ORDER — ACETAMINOPHEN 650 MG RE SUPP
650.0000 mg | Freq: Four times a day (QID) | RECTAL | Status: DC | PRN
Start: 2016-10-30 — End: 2016-11-04

## 2016-10-30 MED ORDER — INSULIN ASPART 100 UNIT/ML ~~LOC~~ SOLN
0.0000 [IU] | Freq: Every day | SUBCUTANEOUS | Status: DC
  Administered 2016-10-31: 4 [IU] via SUBCUTANEOUS

## 2016-10-30 MED ORDER — ALBUTEROL SULFATE (2.5 MG/3ML) 0.083% IN NEBU
2.5000 mg | INHALATION_SOLUTION | Freq: Four times a day (QID) | RESPIRATORY_TRACT | Status: DC
  Administered 2016-10-30: 2.5 mg via RESPIRATORY_TRACT
  Filled 2016-10-30: qty 3

## 2016-10-30 MED ORDER — HYDRALAZINE HCL 20 MG/ML IJ SOLN
5.0000 mg | INTRAMUSCULAR | Status: DC | PRN
  Administered 2016-10-30: 10 mg via INTRAVENOUS
  Filled 2016-10-30: qty 1

## 2016-10-30 MED ORDER — ALBUTEROL SULFATE (2.5 MG/3ML) 0.083% IN NEBU
5.0000 mg | INHALATION_SOLUTION | Freq: Once | RESPIRATORY_TRACT | Status: AC
  Administered 2016-10-30: 5 mg via RESPIRATORY_TRACT
  Filled 2016-10-30: qty 6

## 2016-10-30 MED ORDER — PAROXETINE HCL 20 MG PO TABS
40.0000 mg | ORAL_TABLET | Freq: Every day | ORAL | Status: DC
  Administered 2016-10-31 – 2016-11-04 (×5): 40 mg via ORAL
  Filled 2016-10-30 (×5): qty 2

## 2016-10-30 MED ORDER — TRAMADOL HCL 50 MG PO TABS
50.0000 mg | ORAL_TABLET | Freq: Three times a day (TID) | ORAL | Status: DC | PRN
  Filled 2016-10-30: qty 1

## 2016-10-30 MED ORDER — INSULIN ASPART 100 UNIT/ML ~~LOC~~ SOLN
0.0000 [IU] | Freq: Three times a day (TID) | SUBCUTANEOUS | Status: DC
  Administered 2016-10-30: 2 [IU] via SUBCUTANEOUS
  Administered 2016-10-31: 1 [IU] via SUBCUTANEOUS
  Administered 2016-10-31: 2 [IU] via SUBCUTANEOUS
  Administered 2016-10-31: 3 [IU] via SUBCUTANEOUS
  Administered 2016-11-01: 7 [IU] via SUBCUTANEOUS
  Administered 2016-11-01: 3 [IU] via SUBCUTANEOUS
  Administered 2016-11-01: 9 [IU] via SUBCUTANEOUS
  Administered 2016-11-02: 5 [IU] via SUBCUTANEOUS
  Administered 2016-11-02: 3 [IU] via SUBCUTANEOUS
  Administered 2016-11-02: 2 [IU] via SUBCUTANEOUS

## 2016-10-30 MED ORDER — LEVOFLOXACIN IN D5W 750 MG/150ML IV SOLN
750.0000 mg | Freq: Once | INTRAVENOUS | Status: AC
  Administered 2016-10-30: 750 mg via INTRAVENOUS
  Filled 2016-10-30: qty 150

## 2016-10-30 MED ORDER — MAGNESIUM OXIDE 400 (241.3 MG) MG PO TABS
400.0000 mg | ORAL_TABLET | Freq: Two times a day (BID) | ORAL | Status: DC
  Administered 2016-10-30 – 2016-11-04 (×10): 400 mg via ORAL
  Filled 2016-10-30 (×10): qty 1

## 2016-10-30 MED ORDER — RANOLAZINE ER 500 MG PO TB12
500.0000 mg | ORAL_TABLET | Freq: Two times a day (BID) | ORAL | Status: DC
  Administered 2016-10-30 – 2016-11-04 (×10): 500 mg via ORAL
  Filled 2016-10-30 (×10): qty 1

## 2016-10-30 MED ORDER — TOPIRAMATE 25 MG PO TABS
25.0000 mg | ORAL_TABLET | Freq: Two times a day (BID) | ORAL | Status: DC
  Administered 2016-10-30 – 2016-11-04 (×10): 25 mg via ORAL
  Filled 2016-10-30 (×11): qty 1

## 2016-10-30 MED ORDER — ONDANSETRON HCL 4 MG/2ML IJ SOLN: 4.0000 mg | Freq: Three times a day (TID) | INTRAMUSCULAR | Status: DC | PRN

## 2016-10-30 MED ORDER — LATANOPROST 0.005 % OP SOLN
1.0000 [drp] | Freq: Every day | OPHTHALMIC | Status: DC
  Administered 2016-10-30 – 2016-11-03 (×5): 1 [drp] via OPHTHALMIC
  Filled 2016-10-30: qty 2.5

## 2016-10-30 NOTE — H&P (Signed)
History and Physical    Julian Ross MEQ:[id number] DOB: 03-29-1946 DOA: 10/30/2016   PCP: Kandice Hams, MD   Patient coming from/Resides with: Private residence/lives with wife  Admission status: Inpatient/telemetry -medically necessary to stay a minimum 2 midnights to rule out impending and/or unexpected changes in physiologic status that may differ from initial evaluation performed in the ER and/or at time of admission. The patient presents with symptoms consistent with pneumonia. Patient chronic immunosuppressed medications and recent outpatient cardiac catheterization which puts him at risk for HCAP. He will require general fluid hydration, broad-spectrum IV antibiotics, and monitoring her respiratory status. Patient had similar presentation in the past and had rapid decompensation into ARDS and required intubation. He does meet sepsis physiology criteria's follows: Heart rate greater than 90, respiratory rate greater than 20, white count and neutrophils elevated greater than 12,0000 and >10% bands, he does have evidence of acute organ failure with elevated lactic acid, recent altered mental status. He does have mild elevation in poc troponin likely reflective of demand ischemia from sepsis physiology require cycling troponins during acute phase/initial 24 hours.  Chief Complaint: "infection in the lungs" with shortness of breath productive cough with brown sputum  HPI: Julian Ross is a 70 y.o. male with medical history significant for rheumatoid arthritis on methotrexate, prednisone and leucovorin; sleep apnea on CPAP, hypertension, known chronic combined systolic and diastolic heart failure, atrial fibrillation on eliquis, chronic kidney disease with associated anemia, dyslipidemia. Patient recently diagnosed with multivessel CAD and has been evaluated by Dr. [name] with cardiothoracic surgery with plans to proceed with elective CABG in the near future. Patient was last evaluated by the  cardiac surgeon yesterday 10/30 at that time was having respiratory infection type symptoms and this is part of the reason for the delay in proceeding with surgical intervention. Patient reports 3 days of shortness of breath with increasing fatigue and dyspnea on exertion with cough of productive rusty brown sputum. Patient does not utilize oxygen at home but does utilize a CPAP. In the past 24 hours wife has noticed altered mentation not to to bring him to the ER for evaluation. In addition to the above patient has reported he has had decreased urinary output over the past 24 hours. He denies chest pain or other typical angina symptoms for him.   ED Course:  Vital Signs: BP 134/81   Pulse 120   Temp 99.1 F (37.3 C) (Oral)   Resp (!) 33   SpO2 98%  PCXR: Patchy left upper lobe and right infrahilar opacity suspicious for pneumonia Lab data: Sodium 129, potassium 4.1, chloride 92, CO2 26, BUN 25, creatinine 1.7, glucose 225, LFTs normal except for mildly elevated AST of 74, poc troponin 0.21, initial lactic acid 2.59, WBCs 14,900 with neutrophils 84% and absolute neutrophils 12.5%, hemoglobin 12.3, platelets 212,000, PT 20.6, INR 1.75, PTT 48, blood cultures have been obtained in the ER Medications and treatments: Proventil nebulizer 1, Levaquin 750 mg IV 1  Review of Systems:  In addition to the HPI above,  No Headache, changes with Vision or hearing, new weakness, tingling, numbness in any extremity, dizziness, dysarthria or word finding difficulty, gait disturbance or imbalance, tremors or seizure activity No problems swallowing food or Liquids, indigestion/reflux, choking or coughing while eating, abdominal pain with or after eating No Chest pain, palpitations, orthopnea  No Abdominal pain, N/V, melena,hematochezia, dark tarry stools, constipation No dysuria, malodorous urine, hematuria or flank pain No new skin rashes, lesions, masses or bruises, No new joint  pains, aches, swelling or  redness No recent unintentional weight gain or loss No polyuria, polydypsia or polyphagia   Past Medical History:  Diagnosis Date  . Anemia   . Anxiety state   . CAD (coronary artery disease)    16 prior stents (at [facility])  . Cardiomyopathy, ischemic   . Chronic lower back pain   . Chronic systolic CHF (congestive heart failure) ([place])   . CKD (chronic kidney disease), stage III   . Depression   . Diabetes mellitus type 2 in obese ([place])   . Fibromyalgia   . Hepatitis A    at age 33  . High cholesterol   . History of blood transfusion    "related to OR"  . Hypertension   . Hypokalemia   . MI (myocardial infarction) 1990; 1995; 1997  . OSA on CPAP   . Osteoarthritis   . Persistent atrial fibrillation ([place]) 02/2016   occured in setting of pneumonia  . Rheumatoid arthritis of multiple sites with negative rheumatoid factor ([place])   . Septic shock ([place]) 02/2016   in setting of severe pneumonia    Past Surgical History:  Procedure Laterality Date  . BACK SURGERY    . CARDIAC CATHETERIZATION N/A 10/03/2016   Procedure: Left Heart Cath and Coronary Angiography;  Surgeon: Belva Crome, MD;  Location: [facility];  Service: Cardiovascular;  Laterality: N/A;  . CARDIOVERSION N/A 06/04/2016   Procedure: CARDIOVERSION;  Surgeon: Skeet Latch, MD;  Location: [facility];  Service: Cardiovascular;  Laterality: N/A;  . CATARACT EXTRACTION W/ INTRAOCULAR LENS  IMPLANT, BILATERAL Bilateral 2000s  . CORONARY ANGIOPLASTY  early 2s  . CORONARY ANGIOPLASTY WITH STENT PLACEMENT     "I've got 15 stents" (07/19/2016)  . KNEE ARTHROSCOPY Left 1990s  . POSTERIOR LUMBAR FUSION  2015   L4-5-S1  . right knee surgery     age of 33  . TONSILLECTOMY AND ADENOIDECTOMY  1949    Social History   Social History  . Marital status: Married    Spouse name: N/A  . Number of children: N/A  . Years of education: N/A   Occupational History  . Not on file.   Social History  Main Topics  . Smoking status: Never Smoker  . Smokeless tobacco: Never Used  . Alcohol use No  . Drug use: No  . Sexual activity: Not Currently   Other Topics Concern  . Not on file   Social History Narrative   Micheal Likens, Dentist retired on disability.  Lived in [place] but recently moved to [place].    Mobility: Without assistive devices Work history: Not obtained   Allergies  Allergen Reactions  . Statins Other (See Comments)    Causes stiffness in joints   . Ace Inhibitors Cough  . Atenolol Other (See Comments)    Unknown reaction  . Contrast Media [Iodinated Diagnostic Agents] Rash    Has to take benadryl prior to use  . Penicillins Hives and Rash    Has patient had a PCN reaction causing immediate rash, facial/tongue/throat swelling, SOB or lightheadedness with hypotension: Yes Has patient had a PCN reaction causing severe rash involving mucus membranes or skin necrosis: No Has patient had a PCN reaction that required hospitalization pt was in the hospital at time of last reaction - heart attack Has patient had a PCN reaction occurring within the last 10 years: No If all of the above answers are "NO", then may proceed with Cephalosporin use.  Family History  Problem Relation Age of Onset  . Heart failure Mother   . Hyperlipidemia Mother   . Hypertension Mother   . Heart attack Father   . Heart attack Brother     Prior to Admission medications   Medication Sig Start Date End Date Taking? Authorizing Provider  acetaminophen (TYLENOL) 325 MG tablet Take 650 mg by mouth every 4 (four) hours as needed for mild pain or headache.    Historical Provider, MD  ALPRAZolam (NIRAVAM) 0.5 MG dissolvable tablet Take 1 tablet by mouth as needed (Take as directed).  07/26/16   Historical Provider, MD  apixaban (ELIQUIS) 5 MG TABS tablet Take 1 tablet (5 mg total) by mouth 2 (two) times daily. 04/01/16   Nita Sells, MD  carvedilol (COREG) 3.125 MG tablet Take 3.125 mg by  mouth 2 (two) times daily with a meal.    Historical Provider, MD  fenofibrate (TRICOR) 145 MG tablet Take 145 mg by mouth daily.    Historical Provider, MD  folic acid (FOLVITE) 1 MG tablet Take 1 mg by mouth daily.    Historical Provider, MD  furosemide (LASIX) 20 MG tablet Take 20 mg by mouth 2 (two) times daily.    Historical Provider, MD  gabapentin (NEURONTIN) 300 MG capsule Take 300 mg by mouth 3 (three) times daily.  08/22/16   Historical Provider, MD  hydrocortisone (ANUSOL-HC) 2.5 % rectal cream Place 1 application rectally 2 (two) times daily as needed for hemorrhoids or itching. 07/23/16   Robbie Lis, MD  insulin lispro protamine-lispro (HUMALOG 50/50 MIX) (50-50) 100 UNIT/ML SUSP injection Inject 40-50 Units into the skin 3 (three) times daily. 50, 40, 50    Historical Provider, MD  leucovorin (WELLCOVORIN) 10 MG tablet Take 10 mg by mouth See admin instructions. Take 1 tablet (10 mg) by mouth every 12 hours and 24 hours after the methotrexate dose    Historical Provider, MD  LUMIGAN 0.01 % SOLN Place 1 drop into both eyes daily. 04/30/16   Historical Provider, MD  magnesium oxide (MAG-OX) 400 (241.3 Mg) MG tablet Take 1 tablet (400 mg total) by mouth 2 (two) times daily. 04/01/16   Nita Sells, MD  Methotrexate Sodium (METHOTREXATE, PF,) 50 MG/2ML injection Inject 15 mg into the muscle once a week. Inject 0.8 ml (20 mg) - on Fridays.  Must be given the same time every administration 03/09/16   Historical Provider, MD  Misc Natural Products (OSTEO BI-FLEX ADV DOUBLE ST PO) Take 1 tablet by mouth 2 (two) times daily.    Historical Provider, MD  omega-3 acid ethyl esters (LOVAZA) 1 g capsule Take 1 capsule by mouth 2 (two) times daily. 05/29/16   Historical Provider, MD  omeprazole (PRILOSEC) 20 MG capsule Take 20 mg by mouth daily as needed (heartburn or acid reflux).    Historical Provider, MD  PARoxetine (PAXIL) 40 MG tablet Take 40 mg by mouth daily.  04/30/16   Historical Provider, MD    potassium chloride SA (K-DUR,KLOR-CON) 20 MEQ tablet Take 20 mEq by mouth 4 (four) times daily as needed (potassium supplement).    Historical Provider, MD  predniSONE (DELTASONE) 5 MG tablet Take 5 mg by mouth daily with breakfast.    Historical Provider, MD  ranolazine (RANEXA) 500 MG 12 hr tablet Take 1 tablet (500 mg total) by mouth 2 (two) times daily. 10/12/16   Skeet Latch, MD  tamsulosin (FLOMAX) 0.4 MG CAPS capsule Take 0.4 mg by mouth daily.  02/21/16  Historical Provider, MD  tiZANidine (ZANAFLEX) 4 MG tablet Take 1 tablet by mouth every 8 (eight) hours as needed for muscle spasms.  05/29/16   Historical Provider, MD  topiramate (TOPAMAX) 25 MG tablet Take 25 mg by mouth 2 (two) times daily.    Historical Provider, MD  traMADol (ULTRAM) 50 MG tablet Take 1 tablet by mouth 3 (three) times daily as needed (pain). Take as directed 08/24/16   Historical Provider, MD  VITAMIN D, ERGOCALCIFEROL, PO Take 5,000 Units by mouth daily.     Historical Provider, MD  ZETIA 10 MG tablet Take 10 mg by mouth daily.  07/08/16   Historical Provider, MD    Physical Exam: Vitals:   10/30/16 1237 10/30/16 1330 10/30/16 1345 10/30/16 1419  BP: 156/94 131/94 134/81   Pulse: 62 99 120   Resp: 24 (!) 33    Temp:      TempSrc:      SpO2: 93% 98% 98%   Weight:    99.8 kg (220 lb)  Height:    5\' 11"  (1.803 m)      Constitutional: NAD, calm, comfortable Eyes: PERRL, lids and conjunctivae normal ENMT: Mucous membranes are Dry. Posterior pharynx clear of any exudate or lesions.Normal dentition.  Neck: normal, supple, no masses, no thyromegaly Respiratory: Bilateral crackles with a few expiratory wheezes on exam. RA rest, Normal respiratory effort. No accessory muscle use. Mild tachypnea Cardiovascular: Regular slightly tachycardic rate and rhythm, no murmurs / rubs / gallops. No extremity edema. 2+ pedal pulses. No carotid bruits.  Abdomen: no tenderness, no masses palpated. No hepatosplenomegaly.  Bowel sounds positive.  Musculoskeletal: no clubbing / cyanosis. No joint deformity upper and lower extremities. Good ROM, no contractures. Normal muscle tone.  Skin: no rashes, lesions, ulcers. No induration Neurologic: CN 2-12 grossly intact. Sensation intact, DTR normal. Strength 5/5 x all 4 extremities.  Psychiatric: Normal judgment and insight. Alert and oriented x 3. Normal mood.    Labs on Admission: I have personally reviewed following labs and imaging studies  CBC:  Recent Labs Lab 10/30/16 1131 10/30/16 1139  WBC 14.9*  --   NEUTROABS 12.5*  --   HGB 12.3* 13.3  HCT 36.4* 39.0  MCV 91.9  --   PLT 212  --    Basic Metabolic Panel:  Recent Labs Lab 10/30/16 1131 10/30/16 1139  NA 130* 129*  K 4.0 4.1  CL 93* 92*  CO2 26  --   GLUCOSE 225* 215*  BUN 22* 25*  CREATININE 1.84* 1.70*  CALCIUM 9.0  --    GFR: Estimated Creatinine Clearance: 48.7 mL/min (by C-G formula based on SCr of 1.7 mg/dL (H)). Liver Function Tests:  Recent Labs Lab 10/30/16 1131  AST 74*  ALT 26  ALKPHOS 37*  BILITOT 0.8  PROT 7.2  ALBUMIN 3.4*   No results for input(s): LIPASE, AMYLASE in the last 168 hours. No results for input(s): AMMONIA in the last 168 hours. Coagulation Profile:  Recent Labs Lab 10/30/16 1131  INR 1.75   Cardiac Enzymes: No results for input(s): CKTOTAL, CKMB, CKMBINDEX, TROPONINI in the last 168 hours. BNP (last 3 results) No results for input(s): PROBNP in the last 8760 hours. HbA1C: No results for input(s): HGBA1C in the last 72 hours. CBG:  Recent Labs Lab 10/30/16 1318 10/30/16 1500  GLUCAP 160* 167*   Lipid Profile: No results for input(s): CHOL, HDL, LDLCALC, TRIG, CHOLHDL, LDLDIRECT in the last 72 hours. Thyroid Function Tests: No results for input(s):  TSH, T4TOTAL, FREET4, T3FREE, THYROIDAB in the last 72 hours. Anemia Panel: No results for input(s): VITAMINB12, FOLATE, FERRITIN, TIBC, IRON, RETICCTPCT in the last 72 hours. Urine  analysis:    Component Value Date/Time   COLORURINE YELLOW 07/21/2016 1721   APPEARANCEUR CLEAR 07/21/2016 1721   LABSPEC 1.007 07/21/2016 1721   PHURINE 7.0 07/21/2016 1721   GLUCOSEU NEGATIVE 07/21/2016 1721   HGBUR NEGATIVE 07/21/2016 1721   BILIRUBINUR NEGATIVE 07/21/2016 1721   KETONESUR NEGATIVE 07/21/2016 1721   PROTEINUR NEGATIVE 07/21/2016 1721   NITRITE NEGATIVE 07/21/2016 1721   LEUKOCYTESUR NEGATIVE 07/21/2016 1721   Sepsis Labs: @LABRCNTIP (procalcitonin:4,lacticidven:4) )No results found for this or any previous visit (from the past 240 hour(s)).   Radiological Exams on Admission: Dg Chest Port 1 View  Result Date: 10/30/2016 CLINICAL DATA:  Shortness of breath, wheezing x4 days EXAM: PORTABLE CHEST 1 VIEW COMPARISON:  07/19/2016 FINDINGS: Patchy left upper lobe and right infrahilar opacity, suspicious for pneumonia. No pleural effusion or pneumothorax. The heart is normal in size. IMPRESSION: Patchy left upper lobe and right infrahilar opacity, suspicious for pneumonia. Electronically Signed   By: Julian Hy M.D.   On: 10/30/2016 12:26    EKG: (Independently reviewed) Atrial fibrillation with ventricular rate 100 bpm, QTC 461 ms, no acute ischemic changes and stable since previous EKG other than increased heart rate  Assessment/Plan Principal Problem:   Sepsis due to pneumonia / HCAP (healthcare-associated pneumonia) -Patient presents with 3 days of upper risk for infection symptoms associated with rusty brown productive sputum, altered mentation, decreased urinary output, resistant tachycardia and tachypnea with findings of bilateral pneumonia on chest x-ray -On chronic immunosuppression with methotrexate, prednisone and leucovorin in recent hospital encounter less than 24 hours on 10/4 for cardiac catheterization and therefore we'll treat as healthcare acquired etiology -Adult pneumonia as well as sepsis focused order set initiated -Currently hemodynamically  stable and lactate less than 4 so will not administer to 30 mL/KG boluses but this can be reevaluated based on clinical status and follow-up of lactate -Currently with adequate tissue perfusion/adequate capillary refill -Initiate broad-spectrum antibiotics and follow-up on blood cultures -Attempt to obtain sputum culture -Urinary strep and Legionella -Procalcitonin -Coags mildly elevated but patient does take eliquis chronically -Continue supportive care with nebs and if develops hypoxemia oxygen available -History of ARDS in the past so monitor closely for worsening condition -For completeness of exam check urinalysis and culture  Active Problems:   Rheumatoid arthritis of multiple sites with negative rheumatoid factor  -Continue preadmission prednisone -Receives weekly methotrexate with leucovorin-had not reordered at this juncture -Actually hypertensive so no indication to initiate stress dose steroids    Coronary artery disease of native artery of native heart with stable angina pectoris  -Currently without chest pain -poc troponin was mildly elevated so we'll continue to cycle given known multivessel CAD and plans for CABG in the near future -Continue preadmission carvedilol, TriCor, omega-3 fatty acids, zetia and Ranexa -Elevation in poc troponin and patient without chest pain likely reflective of demand ischemia from sepsis-if develops symptoms or significant elevation of troponin would need formal cardiology consultation    OSA on CPAP -Continue nocturnal CPAP -Can increase frequency or change to BiPAP if respiratory status worsens but this would require transfer to stepdown    Diabetes mellitus on insulin -Utilizes Humalog 50/50 mix at home and instead will utilize 70/30 mix here in decrease dosage as follows; 40-30-40 (breakfast, lunch, supper) -Follow CBGs and provide SSI -Hemoglobin A1c in March was 9.2-repeat  this admission -Continue Neurontin for peripheral neuropathy       Paroxysmal atrial fibrillation -Currently mildly tachycardic in setting of sepsis physiology -Continue carvedilol as above -Continue eliquis -chadvasc = 5     Chronic combined systolic and diastolic CHF (congestive heart failure)  -Currently appears compensated with respiratory symptoms at this juncture attributed to pneumonia -Hypertensive at presentation so we'll continue preadmission carvedilol and give gentle IV fluid hydration at 50 mL per hour -Hold Lasix and potassium    Benign essential HTN -As above    Acute hyponatremia -Suspect pseudohyponatremia and setting of elevated glucose as well as a degree of hyponatremia from dehydration -Follow chemistries    CKD (chronic kidney disease) stage 3, GFR 30-59 ml/min -Renal function stable and at baseline    BPH -Continue Flomax    Anemia of chronic kidney failure -Hemoglobin stable and at baseline    Dyslipidemia associated with type 2 diabetes mellitus -Not on statin  -Continue preadmission omega-3 fatty acids, zetia and TriCor      DVT prophylaxis: Eliquis Code Status: DO NOT RESUSCITATE Family Communication: Wife at bedside Disposition Plan: Anticipate discharge back to preadmission environment once medically stable Consults called: None    ELLIS,ALLISON L. ANP-BC [facility] Pager [phone]   If 7PM-7AM, please contact night-coverage [email or web address] Password [email or web address]  10/30/2016, 3:08 PM

## 2016-10-30 NOTE — ED Triage Notes (Signed)
Pt states "i have an infection in my lungs" pt endorses SOB, cough, coughing up brown sputum. Pt wife states "I think hes having stroke symptoms, hes having trouble lifting his right leg, and he loses focus easily." Pt wife states his LSN was yesterday. Pt is AAOX4. Grip strength equal, no facial droop. Pt is profusing coughing. States hes scheduled for a heart surgery, triple bypass, but it was delayed due to his cough.

## 2016-10-30 NOTE — Progress Notes (Signed)
Pt home NIV unit set up per RT. o2 humidity provided. Pt states that he will place it on his face once he is ready for bed. Pt home unit and mask.

## 2016-10-30 NOTE — ED Notes (Signed)
Nurse will get blood cultures.

## 2016-10-30 NOTE — Progress Notes (Signed)
New pt admission from ED. Pt brought to the floor in stable condition. Vitals taken. Initial Assessment done. All immediate pertinent needs to patient addressed. Patient Guide given to patient. Important safety instructions relating to hospitalization reviewed with patient. Patient verbalized understanding. Will continue to monitor pt.  Shatana Saxton RN 

## 2016-10-30 NOTE — Progress Notes (Signed)
Pharmacy Antibiotic Note  Julian Ross is a 70 y.o. male admitted on 10/30/2016 with SOB and productive cough.  Pharmacy has been consulted for vancomycin dosing. Pt saw Cardiology outpatient yesterday for consult prior to planned cardiac surgery. CC SOB and productive cough x 1 week. Elevated lactic @ 2.59 and WBC @ 14.9. Pt is afebrile.    Plan: Vancomycin 2000 mg IV x1 then 750 mg IV q12h Aztreonam 2g IV q8h Levofloxacin 750 mg IV x1 Monitor clinical course and renal function     Temp (24hrs), Avg:99.1 F (37.3 C), Min:99.1 F (37.3 C), Max:99.1 F (37.3 C)   Recent Labs Lab 10/30/16 1131 10/30/16 1139 10/30/16 1214  WBC 14.9*  --   --   CREATININE 1.84* 1.70*  --   LATICACIDVEN  --   --  2.59*    Estimated Creatinine Clearance: 48.7 mL/min (by C-G formula based on SCr of 1.7 mg/dL (H)).   Stable clearance  Allergies  Allergen Reactions  . Statins Other (See Comments)    Causes stiffness in joints   . Ace Inhibitors Cough  . Atenolol Other (See Comments)    Unknown reaction  . Contrast Media [Iodinated Diagnostic Agents] Rash    Has to take benadryl prior to use  . Penicillins Hives and Rash    Has patient had a PCN reaction causing immediate rash, facial/tongue/throat swelling, SOB or lightheadedness with hypotension: Yes Has patient had a PCN reaction causing severe rash involving mucus membranes or skin necrosis: No Has patient had a PCN reaction that required hospitalization pt was in the hospital at time of last reaction - heart attack Has patient had a PCN reaction occurring within the last 10 years: No If all of the above answers are "NO", then may proceed with Cephalosporin use.    Antimicrobials this admission:  10/31 Vancomycin >> 10/31 Aztreonam >> 10/31 Levofloxacin x1  Dose adjustments this admission:  N/A  Microbiology results:  10/31 BCx: Sent 10/31 UCx: Sent Thank you for allowing pharmacy to be a part of this patient's care.  Cheral Almas, PharmD Candidate 10/30/2016 1:26 PM

## 2016-10-30 NOTE — ED Provider Notes (Signed)
[facility] DEPT Provider Note   CSN: [id number] Arrival date & time: 10/30/16  1116     History   Chief Complaint Chief Complaint  Patient presents with  . Cough  . Shortness of Breath  . Stroke Symptoms    HPI Julian Ross is a 70 y.o. male.  HPI  The patient is a 70 year old male with a known history of significant three-vessel coronary disease, currently on Eliquis, atrial fibrillation, has been recently admitted to the hospital and intubated for severe respiratory failure in relation to pneumonia, also has a history of chronic systolic congestive heart failure, myocardial infarction, chronic kidney disease. According to the patient and his wife he developed increasing shortness of breath several days ago which has been gradually worsening and last night was up all night because he could not breathe. The patient gets orthopnea, he is unable to walk because of exertional symptoms and has increasing coughing and phlegm production. There is no fevers. The patient was seen at the cardiovascular surgeons office yesterday in follow-up as he has planned to have a bypass operation on November 7 however because of increasing shortness of breath this procedure was delayed and the patient was encouraged to follow-up in the primary care setting which she has yet to do. This morning his wife found him to be slow to move, slow to get up and move around, having some difficulty moving his left leg but overall in respiratory distress. His mental status gradually normalized but his shortness of breath has persisted. The patient is acutely ill, has ongoing severe shortness of breath.   Severe diffuse three-vessel coronary disease in this patient with multiple prior stents.  Total occlusion of the right coronary within the ostial segment. Distal vessel fills by collaterals from the left coronary. The distal right coronary is small and may not be graftable.  Severe diffuse LAD disease with 70%  proximal stenosis, 80% mid stenosis, and 90% apical stenosis. The first diagonal which is relatively small contains segmental 70-80% stenosis.  Heavily stented proximal to mid circumflex coronary artery with 50-70% in-stent restenosis, diffuse. Large second obtuse marginal with 75% proximal diffuse narrowing. 70-80% segmental narrowing in the mid circumflex after the origin of the second obtuse marginal.  Past Medical History:  Diagnosis Date  . Anemia   . Anxiety state   . CAD (coronary artery disease)    16 prior stents (at [facility])  . Cardiomyopathy, ischemic   . Chronic lower back pain   . Chronic systolic CHF (congestive heart failure) ([place])   . CKD (chronic kidney disease), stage III   . Depression   . Diabetes mellitus type 2 in obese ([place])   . Fibromyalgia   . Hepatitis A    at age 64  . High cholesterol   . History of blood transfusion    "related to OR"  . Hypertension   . Hypokalemia   . MI (myocardial infarction) 1990; 1995; 1997  . OSA on CPAP   . Osteoarthritis   . Persistent atrial fibrillation ([place]) 02/2016   occured in setting of pneumonia  . Rheumatoid arthritis of multiple sites with negative rheumatoid factor ([place])   . Septic shock ([place]) 02/2016   in setting of severe pneumonia    Patient Active Problem List   Diagnosis Date Noted  . Angina pectoris, crescendo ([place]) 10/03/2016  . Knee effusion   . Syncope 07/20/2016  . Syncope and collapse 07/19/2016  . Chronic combined systolic and diastolic CHF (congestive heart  failure) ([place]) 07/19/2016  . CKD (chronic kidney disease) stage 3, GFR 30-59 ml/min 07/19/2016  . Leukocytosis 07/19/2016  . Anemia of chronic kidney failure 07/19/2016  . Uncontrolled diabetes mellitus with diabetic nephropathy, with long-term current use of insulin ([place]) 07/19/2016  . Benign essential HTN 07/19/2016  . Dyslipidemia associated with type 2 diabetes mellitus ([place]) 07/19/2016  . Paroxysmal atrial fibrillation (H[facility])    . Coronary artery disease of native artery of native heart with stable angina pectoris ([place]) 04/16/2016  . Rheumatoid arthritis of multiple sites with negative rheumatoid factor [facility])     Past Surgical History:  Procedure Laterality Date  . BACK SURGERY    . CARDIAC CATHETERIZATION N/A 10/03/2016   Procedure: Left Heart Cath and Coronary Angiography;  Surgeon: Belva Crome, MD;  Location: [facility];  Service: Cardiovascular;  Laterality: N/A;  . CARDIOVERSION N/A 06/04/2016   Procedure: CARDIOVERSION;  Surgeon: Skeet Latch, MD;  Location: [facility];  Service: Cardiovascular;  Laterality: N/A;  . CATARACT EXTRACTION W/ INTRAOCULAR LENS  IMPLANT, BILATERAL Bilateral 2000s  . CORONARY ANGIOPLASTY  early 93s  . CORONARY ANGIOPLASTY WITH STENT PLACEMENT     "I've got 15 stents" (07/19/2016)  . KNEE ARTHROSCOPY Left 1990s  . POSTERIOR LUMBAR FUSION  2015   L4-5-S1  . right knee surgery     age of 43  . TONSILLECTOMY AND ADENOIDECTOMY  1949       Home Medications    Prior to Admission medications   Medication Sig Start Date End Date Taking? Authorizing Provider  acetaminophen (TYLENOL) 325 MG tablet Take 650 mg by mouth every 4 (four) hours as needed for mild pain or headache.    Historical Provider, MD  ALPRAZolam (NIRAVAM) 0.5 MG dissolvable tablet Take 1 tablet by mouth as needed (Take as directed).  07/26/16   Historical Provider, MD  apixaban (ELIQUIS) 5 MG TABS tablet Take 1 tablet (5 mg total) by mouth 2 (two) times daily. 04/01/16   Nita Sells, MD  carvedilol (COREG) 3.125 MG tablet Take 3.125 mg by mouth 2 (two) times daily with a meal.    Historical Provider, MD  fenofibrate (TRICOR) 145 MG tablet Take 145 mg by mouth daily.    Historical Provider, MD  folic acid (FOLVITE) 1 MG tablet Take 1 mg by mouth daily.    Historical Provider, MD  furosemide (LASIX) 20 MG tablet Take 20 mg by mouth 2 (two) times daily.    Historical Provider, MD  gabapentin  (NEURONTIN) 300 MG capsule Take 300 mg by mouth 3 (three) times daily.  08/22/16   Historical Provider, MD  hydrocortisone (ANUSOL-HC) 2.5 % rectal cream Place 1 application rectally 2 (two) times daily as needed for hemorrhoids or itching. 07/23/16   Robbie Lis, MD  insulin lispro protamine-lispro (HUMALOG 50/50 MIX) (50-50) 100 UNIT/ML SUSP injection Inject 40-50 Units into the skin 3 (three) times daily. 50, 40, 50    Historical Provider, MD  leucovorin (WELLCOVORIN) 10 MG tablet Take 10 mg by mouth See admin instructions. Take 1 tablet (10 mg) by mouth every 12 hours and 24 hours after the methotrexate dose    Historical Provider, MD  LUMIGAN 0.01 % SOLN Place 1 drop into both eyes daily. 04/30/16   Historical Provider, MD  magnesium oxide (MAG-OX) 400 (241.3 Mg) MG tablet Take 1 tablet (400 mg total) by mouth 2 (two) times daily. 04/01/16   Nita Sells, MD  Methotrexate Sodium (METHOTREXATE, PF,) 50 MG/2ML injection Inject 15  mg into the muscle once a week. Inject 0.8 ml (20 mg) - on Fridays.  Must be given the same time every administration 03/09/16   Historical Provider, MD  Misc Natural Products (OSTEO BI-FLEX ADV DOUBLE ST PO) Take 1 tablet by mouth 2 (two) times daily.    Historical Provider, MD  omega-3 acid ethyl esters (LOVAZA) 1 g capsule Take 1 capsule by mouth 2 (two) times daily. 05/29/16   Historical Provider, MD  omeprazole (PRILOSEC) 20 MG capsule Take 20 mg by mouth daily as needed (heartburn or acid reflux).    Historical Provider, MD  PARoxetine (PAXIL) 40 MG tablet Take 40 mg by mouth daily.  04/30/16   Historical Provider, MD  potassium chloride SA (K-DUR,KLOR-CON) 20 MEQ tablet Take 20 mEq by mouth 4 (four) times daily as needed (potassium supplement).    Historical Provider, MD  predniSONE (DELTASONE) 5 MG tablet Take 5 mg by mouth daily with breakfast.    Historical Provider, MD  ranolazine (RANEXA) 500 MG 12 hr tablet Take 1 tablet (500 mg total) by mouth 2 (two) times  daily. 10/12/16   Skeet Latch, MD  tamsulosin (FLOMAX) 0.4 MG CAPS capsule Take 0.4 mg by mouth daily.  02/21/16   Historical Provider, MD  tiZANidine (ZANAFLEX) 4 MG tablet Take 1 tablet by mouth every 8 (eight) hours as needed for muscle spasms.  05/29/16   Historical Provider, MD  topiramate (TOPAMAX) 25 MG tablet Take 25 mg by mouth 2 (two) times daily.    Historical Provider, MD  traMADol (ULTRAM) 50 MG tablet Take 1 tablet by mouth 3 (three) times daily as needed (pain). Take as directed 08/24/16   Historical Provider, MD  VITAMIN D, ERGOCALCIFEROL, PO Take 5,000 Units by mouth daily.     Historical Provider, MD  ZETIA 10 MG tablet Take 10 mg by mouth daily.  07/08/16   Historical Provider, MD    Family History Family History  Problem Relation Age of Onset  . Heart failure Mother   . Hyperlipidemia Mother   . Hypertension Mother   . Heart attack Father   . Heart attack Brother     Social History Social History  Substance Use Topics  . Smoking status: Never Smoker  . Smokeless tobacco: Never Used  . Alcohol use No     Allergies   Statins; Ace inhibitors; Atenolol; Contrast media [iodinated diagnostic agents]; and Penicillins   Review of Systems Review of Systems  All other systems reviewed and are negative.    Physical Exam Updated Vital Signs BP 156/94   Pulse 62   Temp 99.1 F (37.3 C) (Oral)   Resp 24   SpO2 93%   Physical Exam  Constitutional: He appears well-developed and well-nourished. He appears distressed.  HENT:  Head: Normocephalic and atraumatic.  Mouth/Throat: Oropharynx is clear and moist. No oropharyngeal exudate.  Eyes: Conjunctivae and EOM are normal. Pupils are equal, round, and reactive to light. Right eye exhibits no discharge. Left eye exhibits no discharge. No scleral icterus.  Neck: Normal range of motion. Neck supple. No JVD present. No thyromegaly present.  Cardiovascular: Normal heart sounds and intact distal pulses.  Exam reveals  no gallop and no friction rub.   No murmur heard. Tachycardia, atrial fibrillation  Pulmonary/Chest: He is in respiratory distress. He has wheezes. He has rales.  Increased work of breathing, accessory muscle use, rales throughout the left lung, decreased air movement in the right lung, diffuse expiratory wheezing, speaks in 2-3 word  sentences  Abdominal: Soft. Bowel sounds are normal. He exhibits no distension and no mass. There is no tenderness.  Musculoskeletal: Normal range of motion. He exhibits no edema or tenderness.  No edema of the lower extremities  Lymphadenopathy:    He has no cervical adenopathy.  Neurological: He is alert. Coordination normal.  Moves all 4 extremities without difficulty, normal finger-nose-finger, normal coordination, normal strength in all 4 extremities, no facial droop  Skin: Skin is warm and dry. No rash noted. No erythema.  Psychiatric: He has a normal mood and affect. His behavior is normal.  Nursing note and vitals reviewed.    ED Treatments / Results  Labs (all labs ordered are listed, but only abnormal results are displayed) Labs Reviewed  PROTIME-INR - Abnormal; Notable for the following:       Result Value   Prothrombin Time 20.6 (*)    All other components within normal limits  APTT - Abnormal; Notable for the following:    aPTT 48 (*)    All other components within normal limits  CBC - Abnormal; Notable for the following:    WBC 14.9 (*)    RBC 3.96 (*)    Hemoglobin 12.3 (*)    HCT 36.4 (*)    All other components within normal limits  DIFFERENTIAL - Abnormal; Notable for the following:    Neutro Abs 12.5 (*)    All other components within normal limits  COMPREHENSIVE METABOLIC PANEL - Abnormal; Notable for the following:    Sodium 130 (*)    Chloride 93 (*)    Glucose, Bld 225 (*)    BUN 22 (*)    Creatinine, Ser 1.84 (*)    Albumin 3.4 (*)    AST 74 (*)    Alkaline Phosphatase 37 (*)    GFR calc non Af Amer 35 (*)    GFR  calc Af Amer 41 (*)    All other components within normal limits  I-STAT TROPOININ, ED - Abnormal; Notable for the following:    Troponin i, poc 0.21 (*)    All other components within normal limits  I-STAT CHEM 8, ED - Abnormal; Notable for the following:    Sodium 129 (*)    Chloride 92 (*)    BUN 25 (*)    Creatinine, Ser 1.70 (*)    Glucose, Bld 215 (*)    Calcium, Ion 1.06 (*)    All other components within normal limits  I-STAT CG4 LACTIC ACID, ED - Abnormal; Notable for the following:    Lactic Acid, Venous 2.59 (*)    All other components within normal limits  I-STAT ARTERIAL BLOOD GAS, ED - Abnormal; Notable for the following:    pH, Arterial 7.466 (*)    pO2, Arterial 65.0 (*)    All other components within normal limits  CULTURE, BLOOD (ROUTINE X 2)  CULTURE, BLOOD (ROUTINE X 2)  URINE CULTURE  CULTURE, EXPECTORATED SPUTUM-ASSESSMENT  GRAM STAIN  URINALYSIS, ROUTINE W REFLEX MICROSCOPIC (NOT AT [facility])  BLOOD GAS, ARTERIAL  STREP PNEUMONIAE URINARY ANTIGEN  LEGIONELLA PNEUMOPHILA SEROGP 1 UR AG  TROPONIN I  TROPONIN I  TROPONIN I  CBG MONITORING, ED    EKG  EKG Interpretation  Date/Time:  Tuesday October 30 2016 11:33:32 EDT Ventricular Rate:  100 PR Interval:    QRS Duration: 100 QT Interval:  358 QTC Calculation: 461 R Axis:   89 Text Interpretation:  Atrial fibrillation Cannot rule out Anterior infarct , age undetermined  Abnormal ECG Since last tracing rate faster Confirmed by Rainbow Salman  MD, [place] ([id number]) on 10/30/2016 11:43:45 AM       Radiology Dg Chest Port 1 View  Result Date: 10/30/2016 CLINICAL DATA:  Shortness of breath, wheezing x4 days EXAM: PORTABLE CHEST 1 VIEW COMPARISON:  07/19/2016 FINDINGS: Patchy left upper lobe and right infrahilar opacity, suspicious for pneumonia. No pleural effusion or pneumothorax. The heart is normal in size. IMPRESSION: Patchy left upper lobe and right infrahilar opacity, suspicious for pneumonia. Electronically  Signed   By: Julian Hy M.D.   On: 10/30/2016 12:26    Procedures Procedures (including critical care time)  Medications Ordered in ED Medications  levofloxacin (LEVAQUIN) IVPB 750 mg (750 mg Intravenous New Bag/Given 10/30/16 1251)  aztreonam (AZACTAM) 2 g in dextrose 5 % 50 mL IVPB (not administered)  0.9 %  sodium chloride infusion (not administered)  albuterol (PROVENTIL) (2.5 MG/3ML) 0.083% nebulizer solution 5 mg (5 mg Nebulization Given 10/30/16 1251)     Initial Impression / Assessment and Plan / ED Course  I have reviewed the triage vital signs and the nursing notes.  Pertinent labs & imaging results that were available during my care of the patient were reviewed by me and considered in my medical decision making (see chart for details).  Clinical Course   The patient is critically ill, he has a respiratory pathology, this could be driven by an underlying cardiac source though he does have abnormal lung sounds on the left. His EKG does show atrial fibrillation, he does have a rather rapid rate but at this time does not need any rate control with a pulse of 100. Oxygen between 93 and 100% depending on his effort, rales throughout the left chest suggest an underlying infectious etiology. His troponin is elevated consistent with a non-ST elevation MI however with chronic kidney disease and chronic congestive heart failure it is unclear whether this is a true ischemia or driven by demand. He will be placed on cardiac monitor, labs, x-ray, consultation and admission to the hospital for this critically ill patient.  CRITICAL CARE Performed by: Johnna Acosta Total critical care time: 35 minutes Critical care time was exclusive of separately billable procedures and treating other patients. Critical care was necessary to treat or prevent imminent or life-threatening deterioration. Critical care was time spent personally by me on the following activities: development of treatment  plan with patient and/or surrogate as well as nursing, discussions with consultants, evaluation of patient's response to treatment, examination of patient, obtaining history from patient or surrogate, ordering and performing treatments and interventions, ordering and review of laboratory studies, ordering and review of radiographic studies, pulse oximetry and re-evaluation of patient's condition.  Discussed care with the hospitalist, Ebony Hail will admit the patient and requests not to give 30 mL/kg of IV fluid. Rather we'll go slow since the patient has congestive heart failure. At this time the patient is normotensive but does have a lactic acid over 2. This will be repeated. The patient will be admitted to the hospital.  Final Clinical Impressions(s) / ED Diagnoses   Final diagnoses:  None    New Prescriptions New Prescriptions   No medications on file     Noemi Chapel, MD 10/30/16 1317

## 2016-10-30 NOTE — ED Notes (Signed)
Dr Miller at bedside. 

## 2016-10-30 NOTE — Progress Notes (Signed)
CRITICAL VALUE ALERT  Critical value received:  Troponin 0.13   Date of notification:  10/30/16   Time of notification:  8295  Critical value read back: yes  Nurse who received alert:  Hershell Brandl RN  MD notified (1st page):  Erin Hearing NP  Time of first page:  [phone]

## 2016-10-31 ENCOUNTER — Inpatient Hospital Stay (HOSPITAL_COMMUNITY): Payer: Medicare Other

## 2016-10-31 DIAGNOSIS — J189 Pneumonia, unspecified organism: Secondary | ICD-10-CM

## 2016-10-31 LAB — BLOOD GAS, ARTERIAL
Acid-Base Excess: 0.7 mmol/L (ref 0.0–2.0)
Bicarbonate: 24.3 mmol/L (ref 20.0–28.0)
Drawn by: 448981
O2 CONTENT: 2 L/min
O2 Saturation: 98.8 %
PCO2 ART: 36 mmHg (ref 32.0–48.0)
PH ART: 7.445 (ref 7.350–7.450)
PO2 ART: 137 mmHg — AB (ref 83.0–108.0)
Patient temperature: 98.6

## 2016-10-31 LAB — CBC
HEMATOCRIT: 36.2 % — AB (ref 39.0–52.0)
Hemoglobin: 12.6 g/dL — ABNORMAL LOW (ref 13.0–17.0)
MCH: 31 pg (ref 26.0–34.0)
MCHC: 34.8 g/dL (ref 30.0–36.0)
MCV: 89.2 fL (ref 78.0–100.0)
Platelets: 230 10*3/uL (ref 150–400)
RBC: 4.06 MIL/uL — ABNORMAL LOW (ref 4.22–5.81)
RDW: 13.6 % (ref 11.5–15.5)
WBC: 18.9 10*3/uL — ABNORMAL HIGH (ref 4.0–10.5)

## 2016-10-31 LAB — GLUCOSE, CAPILLARY
GLUCOSE-CAPILLARY: 198 mg/dL — AB (ref 65–99)
GLUCOSE-CAPILLARY: 137 mg/dL — AB (ref 65–99)
GLUCOSE-CAPILLARY: 234 mg/dL — AB (ref 65–99)
Glucose-Capillary: 306 mg/dL — ABNORMAL HIGH (ref 65–99)

## 2016-10-31 LAB — URINALYSIS, ROUTINE W REFLEX MICROSCOPIC
BILIRUBIN URINE: NEGATIVE
Glucose, UA: NEGATIVE mg/dL
KETONES UR: NEGATIVE mg/dL
Leukocytes, UA: NEGATIVE
Nitrite: NEGATIVE
PH: 6.5 (ref 5.0–8.0)
Protein, ur: 100 mg/dL — AB
SPECIFIC GRAVITY, URINE: 1.008 (ref 1.005–1.030)

## 2016-10-31 LAB — COMPREHENSIVE METABOLIC PANEL
ALBUMIN: 3.1 g/dL — AB (ref 3.5–5.0)
ALT: 28 U/L (ref 17–63)
ANION GAP: 14 (ref 5–15)
AST: 83 U/L — ABNORMAL HIGH (ref 15–41)
Alkaline Phosphatase: 41 U/L (ref 38–126)
BILIRUBIN TOTAL: 1.4 mg/dL — AB (ref 0.3–1.2)
BUN: 18 mg/dL (ref 6–20)
CO2: 20 mmol/L — ABNORMAL LOW (ref 22–32)
Calcium: 8.6 mg/dL — ABNORMAL LOW (ref 8.9–10.3)
Chloride: 95 mmol/L — ABNORMAL LOW (ref 101–111)
Creatinine, Ser: 1.42 mg/dL — ABNORMAL HIGH (ref 0.61–1.24)
GFR calc Af Amer: 56 mL/min — ABNORMAL LOW (ref >60)
GFR calc non Af Amer: 49 mL/min — ABNORMAL LOW (ref >60)
GLUCOSE: 192 mg/dL — AB (ref 65–99)
POTASSIUM: 3.5 mmol/L (ref 3.5–5.1)
Sodium: 129 mmol/L — ABNORMAL LOW (ref 135–145)
TOTAL PROTEIN: 7 g/dL (ref 6.5–8.1)

## 2016-10-31 LAB — URINE MICROSCOPIC-ADD ON: Squamous Epithelial / LPF: NONE SEEN

## 2016-10-31 LAB — STREP PNEUMONIAE URINARY ANTIGEN: Strep Pneumo Urinary Antigen: NEGATIVE

## 2016-10-31 LAB — TROPONIN I: Troponin I: 0.12 ng/mL (ref <0.03)

## 2016-10-31 MED ORDER — MUPIROCIN 2 % EX OINT
1.0000 "application " | TOPICAL_OINTMENT | Freq: Two times a day (BID) | CUTANEOUS | Status: DC
  Administered 2016-10-31 – 2016-11-04 (×9): 1 via NASAL
  Filled 2016-10-31: qty 22

## 2016-10-31 MED ORDER — ORAL CARE MOUTH RINSE
15.0000 mL | Freq: Two times a day (BID) | OROMUCOSAL | Status: DC
  Administered 2016-10-31 – 2016-11-02 (×3): 15 mL via OROMUCOSAL

## 2016-10-31 MED ORDER — CHLORHEXIDINE GLUCONATE CLOTH 2 % EX PADS
6.0000 | MEDICATED_PAD | Freq: Every day | CUTANEOUS | Status: DC
  Administered 2016-11-01 – 2016-11-04 (×4): 6 via TOPICAL

## 2016-10-31 MED ORDER — ALBUTEROL SULFATE (2.5 MG/3ML) 0.083% IN NEBU
INHALATION_SOLUTION | RESPIRATORY_TRACT | Status: AC
  Administered 2016-10-31: 14:00:00
  Filled 2016-10-31: qty 3

## 2016-10-31 MED ORDER — INSULIN ASPART PROT & ASPART (70-30 MIX) 100 UNIT/ML ~~LOC~~ SUSP
40.0000 [IU] | Freq: Every day | SUBCUTANEOUS | Status: DC
  Administered 2016-11-01: 40 [IU] via SUBCUTANEOUS
  Filled 2016-10-31: qty 10

## 2016-10-31 MED ORDER — INSULIN ASPART PROT & ASPART (70-30 MIX) 100 UNIT/ML ~~LOC~~ SUSP
40.0000 [IU] | Freq: Every day | SUBCUTANEOUS | Status: DC
  Administered 2016-10-31: 40 [IU] via SUBCUTANEOUS
  Filled 2016-10-31: qty 10

## 2016-10-31 MED ORDER — METHYLPREDNISOLONE SODIUM SUCC 125 MG IJ SOLR
125.0000 mg | Freq: Four times a day (QID) | INTRAMUSCULAR | Status: DC
  Administered 2016-10-31 – 2016-11-01 (×5): 125 mg via INTRAVENOUS
  Filled 2016-10-31 (×5): qty 2

## 2016-10-31 MED ORDER — ALBUTEROL SULFATE (2.5 MG/3ML) 0.083% IN NEBU: 2.5000 mg | INHALATION_SOLUTION | RESPIRATORY_TRACT | Status: DC | PRN

## 2016-10-31 MED ORDER — SODIUM CHLORIDE 0.9 % IV SOLN: INTRAVENOUS | Status: DC

## 2016-10-31 MED ORDER — FUROSEMIDE 10 MG/ML IJ SOLN
80.0000 mg | Freq: Once | INTRAMUSCULAR | Status: AC
  Administered 2016-10-31: 80 mg via INTRAVENOUS
  Filled 2016-10-31: qty 8

## 2016-10-31 MED ORDER — INSULIN ASPART PROT & ASPART (70-30 MIX) 100 UNIT/ML ~~LOC~~ SUSP
30.0000 [IU] | Freq: Every day | SUBCUTANEOUS | Status: DC
  Filled 2016-10-31: qty 10

## 2016-10-31 MED ORDER — METOPROLOL TARTRATE 5 MG/5ML IV SOLN
5.0000 mg | Freq: Once | INTRAVENOUS | Status: AC
  Administered 2016-10-31: 5 mg via INTRAVENOUS
  Filled 2016-10-31: qty 5

## 2016-10-31 NOTE — Progress Notes (Signed)
Pt. HR sustatining in the 140s-170s. On call NP for [facility] paged via [facility] text page. BP also elevated at 163/119, oral temp. 97.8, O2 95% on CPAP, Resp 24 and labored. RN will continue to monitor pt.

## 2016-10-31 NOTE — Progress Notes (Addendum)
PROGRESS NOTE  Corday Wyka  ERX:[id number] DOB: November 18, 1946 DOA: 10/30/2016 PCP: Kandice Hams, MD   Brief Narrative: Julian Ross is a 70 y.o. male with medical history significant for rheumatoid arthritis on methotrexate, prednisone and leucovorin; sleep apnea on CPAP, hypertension, chronic combined heart failure, AFib on eliquis, CKD with associated anemia, dyslipidemia, and recently diagnosed multivessel CAD. He presented with 3 days of shortness of breath with increasing fatigue and dyspnea on exertion with cough of productive rusty brown sputum. His wife has noticed altered mentation and decreased urinary output. CXR showed patchy left upper lobe and right infrahilar opacity suspicious for pneumonia, Cr 1.7, poc troponin 0.21, initial lactic acid 2.59, WBCs 14,900 with neutrophils 84% and absolute neutrophils 12.5%. Blood cultures obtained, levaquin and nebs given and he was admitted for treatment of sepsis due to HCAP.  Assessment & Plan: Principal Problem:   HCAP (healthcare-associated pneumonia) Active Problems:   Rheumatoid arthritis of multiple sites with negative rheumatoid factor (HCC)   Coronary artery disease of native artery of native heart with stable angina pectoris (HCC)   Paroxysmal atrial fibrillation (HCC)   Chronic combined systolic and diastolic CHF (congestive heart failure) (HCC)   CKD (chronic kidney disease) stage 3, GFR 30-59 ml/min   Anemia of chronic kidney failure   Benign essential HTN   Dyslipidemia associated with type 2 diabetes mellitus (HCC)   Acute hyponatremia   OSA on CPAP   Sepsis due to pneumonia ([place])   Diabetes mellitus type 2, insulin dependent ([place])   Acute respiratory failure with hypoxia (HCC)  Sepsis and acute respiratory failure due to HCAP: Improving infectious indices. Saturating well on 2L oxygen. Bilateral pneumonia on chest x-ray in immunosuppressed pt s/p LHC 10/4. Note MRSA swab+ and rapidly expansive airspace opacities concerning  for MRSA pneumonia, being covered by vancomycin. - Vanc/aztreonam 10/31 >>  - Follow blood, sputum cultures - Follow PCT - Nebs prn - Oxygen prn -History of ARDS in the past so monitor closely for worsening condition - Repeat CXR in AM  Acute encephalopathy: Pt with diminished arousal and labored breathing 11/1 AM, transferred to SDU for possible initiation of NIPPV fearing hypercarbia. ABG reassuring, and pt has improved significantly. Likely due to sedating medications.  - Hold sedating medications  Seronegative rheumatoid arthritis of multiple sites: - Continue preadmission prednisone; no hypotension, so no stress steroids. - Receives weekly methotrexate with leucovorin - not reordered during admission  Multivessel native CAD with stable angina pectoris: Demand ischemia with mild troponin elevation in setting of sepsis but no active ACS. CABG planned in near future.  - Continue preadmission carvedilol, TriCor, omega-3 fatty acids, zetia and Ranexa - If troponins trend upward, will consult cardiology  OSA on CPAP - Continue nocturnal CPAP  IDDM: March HbA1c 9.2%  - Utilizes Humalog 50/50 mix at home and instead will utilize 70/30 mix here in decrease dosage as follows; 40-30-40 (breakfast, lunch, supper) - Follow CBGs and provide SSI - Continue neurontin for peripheral neuropathy   Paroxysmal atrial fibrillation: Rate-controlled. CHADsVASc 5. - Continue coreg and eliquis  Chronic combined systolic and diastolic CHF (congestive heart failure): July 2017 EF 50-55%, inferior akinesis and G2DD. Low suspicion for decompensation at admission, but airspace opacities bilaterally noted on CXR this AM.  - D/C IVF, give lasix 80mg  IV x1 - Repeat CXR in AM  Benign essential HTN -As above  Acute hyponatremia -Suspect pseudohyponatremia and setting of elevated glucose as well as a degree of hyponatremia from dehydration -Follow chemistries  CKD (  chronic kidney disease) stage 3,  GFR 30-59 ml/min -Renal function stable and at baseline  BPH -Continue Flomax  Anemia of chronic kidney failure -Hemoglobin stable and at baseline  Dyslipidemia associated with type 2 diabetes mellitus - Statin allergy noted -Continue preadmission omega-3 fatty acids, zetia and TriCor  DVT prophylaxis: Eliquis Code Status: DNR confirmed at admission. NIPPV ok. Family Communication: Wife at bedside this PM. Disposition Plan: If remains stable, will transfer back to telemetry floor 11/2. Eventual D/C back to home when stable.  Consultants:   None  Procedures:   None  Antimicrobials:  Levaquin 10/31 x1  Aztreonam 10/31 >>   Vancomycin 10/31 >>  Subjective: Pt somnolent this AM, oriented but nods off while talking. Denies chest pain or trouble breathing. Received benzodiazepines this AM.  Objective: Vitals:   10/31/16 1330 10/31/16 1426 10/31/16 1636 10/31/16 1754  BP: (!) 149/74  (!) 144/90   Pulse: 88   98  Resp: (!) 23     Temp:   97.8 F (36.6 C)   TempSrc:   Oral   SpO2: 95% 96%    Weight:      Height:        Intake/Output Summary (Last 24 hours) at 10/31/16 1808 Last data filed at 10/31/16 1446  Gross per 24 hour  Intake          1771.67 ml  Output             2151 ml  Net          -379.33 ml   Filed Weights   10/30/16 1419 10/30/16 1551  Weight: 99.8 kg (220 lb) 98 kg (216 lb 0.8 oz)    Examination: General exam: 70 y.o. male very drowsy in no distress Respiratory system: Labored tachypneic breathing. Decreased breath sounds throughout. +bibasilar crackles. Cardiovascular system: Regular rate and rhythm. No murmur, rub, or gallop. No JVD, and No pedal edema. Gastrointestinal system: Abdomen soft, non-tender, non-distended, with normoactive bowel sounds. No organomegaly or masses felt. Central nervous system: Drowsy but rousable and oriented. No focal neurological deficits. Extremities: Warm, no deformities Skin: No rashes, lesions no  ulcers Psychiatry: Mood & affect appropriate.   Data Reviewed: I have personally reviewed following labs and imaging studies  CBC:  Recent Labs Lab 10/30/16 1131 10/30/16 1139 10/30/16 1542 10/31/16 0106  WBC 14.9*  --  16.1* 18.9*  NEUTROABS 12.5*  --  12.8*  --   HGB 12.3* 13.3 13.3 12.6*  HCT 36.4* 39.0 38.8* 36.2*  MCV 91.9  --  91.3 89.2  PLT 212  --  214 606   Basic Metabolic Panel:  Recent Labs Lab 10/30/16 1131 10/30/16 1139 10/31/16 0106  NA 130* 129* 129*  K 4.0 4.1 3.5  CL 93* 92* 95*  CO2 26  --  20*  GLUCOSE 225* 215* 192*  BUN 22* 25* 18  CREATININE 1.84* 1.70* 1.42*  CALCIUM 9.0  --  8.6*   GFR: Estimated Creatinine Clearance: 57.8 mL/min (by C-G formula based on SCr of 1.42 mg/dL (H)). Liver Function Tests:  Recent Labs Lab 10/30/16 1131 10/31/16 0106  AST 74* 83*  ALT 26 28  ALKPHOS 37* 41  BILITOT 0.8 1.4*  PROT 7.2 7.0  ALBUMIN 3.4* 3.1*   No results for input(s): LIPASE, AMYLASE in the last 168 hours. No results for input(s): AMMONIA in the last 168 hours. Coagulation Profile:  Recent Labs Lab 10/30/16 1131  INR 1.75   Cardiac Enzymes:  Recent Labs Lab  10/30/16 1542 10/30/16 1846 10/31/16 0106  TROPONINI 0.13* 0.11* 0.12*   BNP (last 3 results) No results for input(s): PROBNP in the last 8760 hours. HbA1C: No results for input(s): HGBA1C in the last 72 hours. CBG:  Recent Labs Lab 10/30/16 1653 10/30/16 2209 10/31/16 0632 10/31/16 1143 10/31/16 1637  GLUCAP 151* 164* 198* 137* 234*   Lipid Profile: No results for input(s): CHOL, HDL, LDLCALC, TRIG, CHOLHDL, LDLDIRECT in the last 72 hours. Thyroid Function Tests: No results for input(s): TSH, T4TOTAL, FREET4, T3FREE, THYROIDAB in the last 72 hours. Anemia Panel: No results for input(s): VITAMINB12, FOLATE, FERRITIN, TIBC, IRON, RETICCTPCT in the last 72 hours. Urine analysis:    Component Value Date/Time   COLORURINE YELLOW 10/30/2016 [place] 10/30/2016 2215   LABSPEC 1.008 10/30/2016 2215   PHURINE 6.5 10/30/2016 2215   GLUCOSEU NEGATIVE 10/30/2016 2215   HGBUR SMALL (A) 10/30/2016 2215   BILIRUBINUR NEGATIVE 10/30/2016 [place] 10/30/2016 2215   PROTEINUR 100 (A) 10/30/2016 2215   NITRITE NEGATIVE 10/30/2016 2215   LEUKOCYTESUR NEGATIVE 10/30/2016 2215   Sepsis Labs: @LABRCNTIP (procalcitonin:4,lacticidven:4)  ) Recent Results (from the past 240 hour(s))  Blood Culture (routine x 2)     Status: None (Preliminary result)   Collection Time: 10/30/16 12:21 PM  Result Value Ref Range Status   Specimen Description BLOOD RIGHT ANTECUBITAL  Final   Special Requests BOTTLES DRAWN AEROBIC AND ANAEROBIC  5CC  Final   Culture NO GROWTH 1 DAY  Final   Report Status PENDING  Incomplete  Blood Culture (routine x 2)     Status: None (Preliminary result)   Collection Time: 10/30/16 12:30 PM  Result Value Ref Range Status   Specimen Description BLOOD RIGHT WRIST  Final   Special Requests BOTTLES DRAWN AEROBIC AND ANAEROBIC  5CC  Final   Culture NO GROWTH 1 DAY  Final   Report Status PENDING  Incomplete  MRSA PCR Screening     Status: Abnormal   Collection Time: 10/30/16  3:57 PM  Result Value Ref Range Status   MRSA by PCR POSITIVE (A) NEGATIVE Final    Comment:        The GeneXpert MRSA Assay (FDA approved for NASAL specimens only), is one component of a comprehensive MRSA colonization surveillance program. It is not intended to diagnose MRSA infection nor to guide or monitor treatment for MRSA infections. RESULT CALLED TO, READ BACK BY AND VERIFIED WITH: B.SCHERER,RN AT 1848 BY L.PITT 10/30/16      Radiology Studies: Dg Chest Port 1 View  Result Date: 10/31/2016 CLINICAL DATA:  Respiratory distress, pneumonia, CHF, diabetes EXAM: PORTABLE CHEST 1 VIEW COMPARISON:  10/30/2016 FINDINGS: Worsening patchy and consolidative airspace process throughout the left lung and the right hilum compatible  with bilateral pneumonia. Low lung volumes persist. No developing effusion or pneumothorax. Trachea is midline. Heart is enlarged. Aortic atherosclerosis noted. IMPRESSION: Worsening diffuse left lung and right hilar airspace process compatible with pneumonia Electronically Signed   By: Jerilynn Mages.  Shick M.D.   On: 10/31/2016 09:35   Dg Chest Port 1 View  Result Date: 10/30/2016 CLINICAL DATA:  Shortness of breath, wheezing x4 days EXAM: PORTABLE CHEST 1 VIEW COMPARISON:  07/19/2016 FINDINGS: Patchy left upper lobe and right infrahilar opacity, suspicious for pneumonia. No pleural effusion or pneumothorax. The heart is normal in size. IMPRESSION: Patchy left upper lobe and right infrahilar opacity, suspicious for pneumonia. Electronically Signed   By: Henderson Newcomer.D.  On: 10/30/2016 12:26    Scheduled Meds: . apixaban  5 mg Oral BID  . aztreonam  2 g Intravenous Q8H  . carvedilol  3.125 mg Oral BID WC  . [START ON 11/01/2016] Chlorhexidine Gluconate Cloth  6 each Topical Q0600  . ezetimibe  10 mg Oral Daily  . fenofibrate  160 mg Oral Daily  . gabapentin  300 mg Oral TID  . insulin aspart  0-5 Units Subcutaneous QHS  . insulin aspart  0-9 Units Subcutaneous TID WC  . [START ON 11/01/2016] insulin aspart protamine- aspart  40 Units Subcutaneous Q breakfast   And  . [START ON 11/01/2016] insulin aspart protamine- aspart  30 Units Subcutaneous Q lunch  . insulin aspart protamine- aspart  40 Units Subcutaneous Q supper  . latanoprost  1 drop Both Eyes QHS  . magnesium oxide  400 mg Oral BID  . mouth rinse  15 mL Mouth Rinse BID  . methylPREDNISolone (SOLU-MEDROL) injection  125 mg Intravenous Q6H  . mupirocin ointment  1 application Nasal BID  . omega-3 acid ethyl esters  1 capsule Oral BID  . pantoprazole  40 mg Oral Daily  . PARoxetine  40 mg Oral Daily  . ranolazine  500 mg Oral BID  . sodium chloride flush  3 mL Intravenous Q12H  . tamsulosin  0.4 mg Oral Daily  . topiramate  25 mg Oral  BID  . vancomycin  750 mg Intravenous Q12H   Continuous Infusions: . sodium chloride Stopped (10/31/16 1335)    LOS: 1 day   Time spent: 25 minutes.  Vance Gather, MD [facility] Pager [phone]  If 7PM-7AM, please contact night-coverage [email or web address] Password [email or web address] 10/31/2016, 6:08 PM

## 2016-10-31 NOTE — Progress Notes (Addendum)
Pt has increased workload of breathing along with expiratory wheezing. Pt responds to sternal rub but quickly falls back asleep. Dr. Bonner Puna at bedside to evaluate patient. Orders given. Rapid Response RN Debbie called and aware of situation. Respiratory Therapist Richardson Landry at bedside. Pt to be transferred to stepdown and Bipap orders due to pt history of rapid respiratory decline. Pt wife [name] called and made aware of situation but very adamant that patient is DNR and not to be intubated but Bipap is okay. Will continue to monitor.

## 2016-11-01 ENCOUNTER — Inpatient Hospital Stay (HOSPITAL_COMMUNITY): Payer: Medicare Other

## 2016-11-01 LAB — BASIC METABOLIC PANEL
ANION GAP: 10 (ref 5–15)
BUN: 24 mg/dL — ABNORMAL HIGH (ref 6–20)
CHLORIDE: 99 mmol/L — AB (ref 101–111)
CO2: 24 mmol/L (ref 22–32)
Calcium: 9.1 mg/dL (ref 8.9–10.3)
Creatinine, Ser: 1.48 mg/dL — ABNORMAL HIGH (ref 0.61–1.24)
GFR calc Af Amer: 54 mL/min — ABNORMAL LOW (ref >60)
GFR, EST NON AFRICAN AMERICAN: 46 mL/min — AB (ref >60)
GLUCOSE: 287 mg/dL — AB (ref 65–99)
POTASSIUM: 3.9 mmol/L (ref 3.5–5.1)
Sodium: 133 mmol/L — ABNORMAL LOW (ref 135–145)

## 2016-11-01 LAB — CBC
HEMATOCRIT: 35.4 % — AB (ref 39.0–52.0)
HEMOGLOBIN: 12.1 g/dL — AB (ref 13.0–17.0)
MCH: 30.4 pg (ref 26.0–34.0)
MCHC: 34.2 g/dL (ref 30.0–36.0)
MCV: 88.9 fL (ref 78.0–100.0)
Platelets: 232 10*3/uL (ref 150–400)
RBC: 3.98 MIL/uL — AB (ref 4.22–5.81)
RDW: 13.4 % (ref 11.5–15.5)
WBC: 14.2 10*3/uL — AB (ref 4.0–10.5)

## 2016-11-01 LAB — GLUCOSE, CAPILLARY
GLUCOSE-CAPILLARY: 229 mg/dL — AB (ref 65–99)
GLUCOSE-CAPILLARY: 193 mg/dL — AB (ref 65–99)
Glucose-Capillary: 317 mg/dL — ABNORMAL HIGH (ref 65–99)
Glucose-Capillary: 349 mg/dL — ABNORMAL HIGH (ref 65–99)

## 2016-11-01 LAB — HEMOGLOBIN A1C
HEMOGLOBIN A1C: 7.7 % — AB (ref 4.8–5.6)
MEAN PLASMA GLUCOSE: 174 mg/dL

## 2016-11-01 LAB — LEGIONELLA PNEUMOPHILA SEROGP 1 UR AG: L. PNEUMOPHILA SEROGP 1 UR AG: NEGATIVE

## 2016-11-01 LAB — PROCALCITONIN: Procalcitonin: 0.58 ng/mL

## 2016-11-01 MED ORDER — INSULIN ASPART PROT & ASPART (70-30 MIX) 100 UNIT/ML ~~LOC~~ SUSP
40.0000 [IU] | Freq: Every day | SUBCUTANEOUS | Status: DC
  Administered 2016-11-01 – 2016-11-03 (×3): 40 [IU] via SUBCUTANEOUS
  Filled 2016-11-01: qty 10

## 2016-11-01 MED ORDER — INSULIN ASPART PROT & ASPART (70-30 MIX) 100 UNIT/ML ~~LOC~~ SUSP
50.0000 [IU] | Freq: Every day | SUBCUTANEOUS | Status: DC
  Administered 2016-11-01 – 2016-11-03 (×3): 50 [IU] via SUBCUTANEOUS

## 2016-11-01 MED ORDER — METHYLPREDNISOLONE SODIUM SUCC 125 MG IJ SOLR
125.0000 mg | Freq: Two times a day (BID) | INTRAMUSCULAR | Status: DC
  Administered 2016-11-02: 125 mg via INTRAVENOUS
  Filled 2016-11-01: qty 2

## 2016-11-01 MED ORDER — INSULIN ASPART PROT & ASPART (70-30 MIX) 100 UNIT/ML ~~LOC~~ SUSP
50.0000 [IU] | Freq: Every day | SUBCUTANEOUS | Status: DC
  Administered 2016-11-02 – 2016-11-03 (×2): 50 [IU] via SUBCUTANEOUS

## 2016-11-01 MED ORDER — POTASSIUM CHLORIDE CRYS ER 20 MEQ PO TBCR
20.0000 meq | EXTENDED_RELEASE_TABLET | Freq: Every day | ORAL | Status: DC
  Administered 2016-11-01 – 2016-11-04 (×4): 20 meq via ORAL
  Filled 2016-11-01 (×4): qty 1

## 2016-11-01 MED ORDER — FUROSEMIDE 20 MG PO TABS
20.0000 mg | ORAL_TABLET | Freq: Every day | ORAL | Status: DC
  Administered 2016-11-01 – 2016-11-04 (×4): 20 mg via ORAL
  Filled 2016-11-01 (×4): qty 1

## 2016-11-01 NOTE — Progress Notes (Signed)
Patient has home CPAP in room but unable to use due to a "service required" notification on CPAP screen. RT brought a [facility] CPAP unit and hooked up patient's mask and tubing to it. Patient is wearing CPAP comfortably at this time. RT will continue to monitor as needed.

## 2016-11-01 NOTE — Progress Notes (Signed)
PROGRESS NOTE  Julian Ross  GMW:[id number] DOB: 05-Feb-1946 DOA: 10/30/2016 PCP: Kandice Hams, MD   Brief Narrative: Julian Ross is a 70 y.o. male with medical history significant for rheumatoid arthritis on methotrexate, prednisone and leucovorin; sleep apnea on CPAP, hypertension, chronic combined heart failure, AFib on eliquis, CKD with associated anemia, dyslipidemia, and recently diagnosed multivessel CAD. He presented with 3 days of shortness of breath with increasing fatigue and dyspnea on exertion with cough of productive rusty brown sputum. His wife has noticed altered mentation and decreased urinary output. CXR showed patchy left upper lobe and right infrahilar opacity suspicious for pneumonia, Cr 1.7, poc troponin 0.21, initial lactic acid 2.59, WBCs 14,900 with neutrophils 84% and absolute neutrophils 12.5%. Blood cultures obtained, levaquin and nebs given and he was admitted for treatment of sepsis due to HCAP. Levaquin was replaced by vanc/aztreonam, and IVF's were provided. On the morning following admission he was given multiple benzodiazepines which caused drowsiness prompting transfer to SDU. ABG at that time was normal and no BiPAP was required. Mental status and overall clinical picture has improved and he was transferred back to the floor 11/2.   Assessment & Plan: Principal Problem:   HCAP (healthcare-associated pneumonia) Active Problems:   Rheumatoid arthritis of multiple sites with negative rheumatoid factor (HCC)   Coronary artery disease of native artery of native heart with stable angina pectoris (HCC)   Paroxysmal atrial fibrillation (HCC)   Chronic combined systolic and diastolic CHF (congestive heart failure) (HCC)   CKD (chronic kidney disease) stage 3, GFR 30-59 ml/min   Anemia of chronic kidney failure   Benign essential HTN   Dyslipidemia associated with type 2 diabetes mellitus (HCC)   Acute hyponatremia   OSA on CPAP   Sepsis due to pneumonia ([place])  Diabetes mellitus type 2, insulin dependent ([place])   Acute respiratory failure with hypoxia (HCC)  Sepsis and acute respiratory failure due to HCAP: Improving infectious indices. Saturating well on 2L oxygen. Bilateral pneumonia on chest x-ray in immunosuppressed pt s/p LHC 10/4. Note MRSA swab+ and rapidly expansive airspace opacities concerning for MRSA pneumonia, being covered by vancomycin. - Vanc/aztreonam 10/31 >>  - Follow blood, sputum cultures, urine Ag's neg. - Follow PCT, considering de-escalation of abx 11/3. - Nebs prn - Oxygen prn: at 2L by [place], wean as tolerated. - History of ARDS in the past so monitor closely for worsening condition - Repeat CXR 11/2 shows improved aeration with persistent bilateral infiltrates.  Acute encephalopathy: Pt with diminished arousal and labored breathing 11/1 AM, transferred to SDU for possible initiation of NIPPV fearing hypercarbia. ABG reassuring, and pt has improved significantly. Likely due to sedating medications. Resolved. - Hold sedating medications  Seronegative rheumatoid arthritis of multiple sites: - Given stress-dose steroids with rapid taper over next 2 days, then continue preadmission prednisone. - Receives weekly methotrexate with leucovorin - not reordered during admission  Multivessel native CAD with stable angina pectoris: Demand ischemia with mild troponin elevation in setting of sepsis but no active ACS. CABG planned in near future.  - Continue preadmission carvedilol, TriCor, omega-3 fatty acids, zetia and Ranexa - Flat troponin trend reassuring. Low threshold for cardiology consult if chest pain develops.  OSA on CPAP - Continue nocturnal CPAP  IDDM: March HbA1c 9.2%, CBGs significantly elevated on reduced insulin doses started at admission, taking reliable po.  - Utilizes Humalog 50/50 mix at home and instead will utilize 70/30 mix here: 50-40-50 (breakfast, lunch, supper) - Follow CBGs and provide SSI -  Continue  neurontin for peripheral neuropathy   Paroxysmal atrial fibrillation: Rate-controlled. CHADsVASc 5. - Continue coreg and eliquis  Chronic combined systolic and diastolic CHF (congestive heart failure): July 2017 EF 50-55%, inferior akinesis and G2DD. Low suspicion for decompensation at admission, but airspace opacities bilaterally noted on CXR this AM.  - D/C IVF, give lasix 80mg  IV x1, restart home lasix in AM - Repeat CXR in AM  Benign essential HTN -As above  Acute hyponatremia: Mild. Improving. -Suspect pseudohyponatremia and setting of elevated glucose as well as a degree of hyponatremia from dehydration - Follow chemistries  CKD (chronic kidney disease) stage 3, GFR 30-59 ml/min - Renal function stable and at baseline - Monitor daily  BPH -Continue Flomax  Anemia of chronic kidney failure -Hemoglobin stable and at baseline  Dyslipidemia associated with type 2 diabetes mellitus - Statin allergy noted - Continue preadmission omega-3 fatty acids, zetia and TriCor  DVT prophylaxis: Eliquis Code Status: DNR confirmed at admission. NIPPV ok. Family Communication: None at bedside this AM. Disposition Plan: Anticipate D/C back to home when stable, 48-72 hours.  Consultants:   None  Procedures:   None  Antimicrobials:  Levaquin 10/31 x1  Aztreonam 10/31 >>   Vancomycin 10/31 >>  Subjective: Pt slept well with CPAP for OSA, no events overnight. Breathing "much better" without chest pain.  Objective: Vitals:   11/01/16 0400 11/01/16 0644 11/01/16 0850 11/01/16 1112  BP: (!) 147/91 (!) 154/101 (!) 167/120 (!) 166/105  Pulse: (!) 57 78 98 67  Resp: 16  17 19   Temp:   97.7 F (36.5 C) 98 F (36.7 C)  TempSrc:   Oral Oral  SpO2: 96%  100% 99%  Weight:      Height:        Intake/Output Summary (Last 24 hours) at 11/01/16 1151 Last data filed at 11/01/16 8938  Gross per 24 hour  Intake           2277.5 ml  Output             3775 ml  Net           -1497.5 ml   Filed Weights   10/30/16 1419 10/30/16 1551 11/01/16 0346  Weight: 99.8 kg (220 lb) 98 kg (216 lb 0.8 oz) 97.9 kg (215 lb 13.3 oz)    Examination: General exam: 70 y.o. male very drowsy in no distress Respiratory system: Nonlabored. Decreased breath sounds throughout. trace bibasilar crackles. Cardiovascular system: Regular rate and rhythm. No murmur, rub, or gallop. No JVD, and No pedal edema. Gastrointestinal system: Abdomen soft, non-tender, non-distended, with normoactive bowel sounds. No organomegaly or masses felt. Central nervous system: Alert and oriented. No focal neurological deficits. Extremities: Warm, no deformities Skin: No rashes, lesions no ulcers Psychiatry: Mood & affect appropriate.   Data Reviewed: I have personally reviewed following labs and imaging studies  CBC:  Recent Labs Lab 10/30/16 1131 10/30/16 1139 10/30/16 1542 10/31/16 0106 11/01/16 0758  WBC 14.9*  --  16.1* 18.9* 14.2*  NEUTROABS 12.5*  --  12.8*  --   --   HGB 12.3* 13.3 13.3 12.6* 12.1*  HCT 36.4* 39.0 38.8* 36.2* 35.4*  MCV 91.9  --  91.3 89.2 88.9  PLT 212  --  214 230 101   Basic Metabolic Panel:  Recent Labs Lab 10/30/16 1131 10/30/16 1139 10/31/16 0106 11/01/16 0758  NA 130* 129* 129* 133*  K 4.0 4.1 3.5 3.9  CL 93* 92* 95* 99*  CO2 26  --  20* 24  GLUCOSE 225* 215* 192* 287*  BUN 22* 25* 18 24*  CREATININE 1.84* 1.70* 1.42* 1.48*  CALCIUM 9.0  --  8.6* 9.1   GFR: Estimated Creatinine Clearance: 55.4 mL/min (by C-G formula based on SCr of 1.48 mg/dL (H)). Liver Function Tests:  Recent Labs Lab 10/30/16 1131 10/31/16 0106  AST 74* 83*  ALT 26 28  ALKPHOS 37* 41  BILITOT 0.8 1.4*  PROT 7.2 7.0  ALBUMIN 3.4* 3.1*   No results for input(s): LIPASE, AMYLASE in the last 168 hours. No results for input(s): AMMONIA in the last 168 hours. Coagulation Profile:  Recent Labs Lab 10/30/16 1131  INR 1.75   Cardiac Enzymes:  Recent Labs Lab  10/30/16 1542 10/30/16 1846 10/31/16 0106  TROPONINI 0.13* 0.11* 0.12*   BNP (last 3 results) No results for input(s): PROBNP in the last 8760 hours. HbA1C:  Recent Labs  10/31/16 0106  HGBA1C 7.7*   CBG:  Recent Labs Lab 10/31/16 1143 10/31/16 1637 10/31/16 2116 11/01/16 0759 11/01/16 1114  GLUCAP 137* 234* 306* 317* 349*   Lipid Profile: No results for input(s): CHOL, HDL, LDLCALC, TRIG, CHOLHDL, LDLDIRECT in the last 72 hours. Thyroid Function Tests: No results for input(s): TSH, T4TOTAL, FREET4, T3FREE, THYROIDAB in the last 72 hours. Anemia Panel: No results for input(s): VITAMINB12, FOLATE, FERRITIN, TIBC, IRON, RETICCTPCT in the last 72 hours. Urine analysis:    Component Value Date/Time   COLORURINE YELLOW 10/30/2016 [place] 10/30/2016 2215   LABSPEC 1.008 10/30/2016 2215   PHURINE 6.5 10/30/2016 2215   GLUCOSEU NEGATIVE 10/30/2016 2215   HGBUR SMALL (A) 10/30/2016 2215   BILIRUBINUR NEGATIVE 10/30/2016 [place] 10/30/2016 2215   PROTEINUR 100 (A) 10/30/2016 2215   NITRITE NEGATIVE 10/30/2016 2215   LEUKOCYTESUR NEGATIVE 10/30/2016 2215   Sepsis Labs: @LABRCNTIP (procalcitonin:4,lacticidven:4)  ) Recent Results (from the past 240 hour(s))  Blood Culture (routine x 2)     Status: None (Preliminary result)   Collection Time: 10/30/16 12:21 PM  Result Value Ref Range Status   Specimen Description BLOOD RIGHT ANTECUBITAL  Final   Special Requests BOTTLES DRAWN AEROBIC AND ANAEROBIC  5CC  Final   Culture NO GROWTH 2 DAYS  Final   Report Status PENDING  Incomplete  Blood Culture (routine x 2)     Status: None (Preliminary result)   Collection Time: 10/30/16 12:30 PM  Result Value Ref Range Status   Specimen Description BLOOD RIGHT WRIST  Final   Special Requests BOTTLES DRAWN AEROBIC AND ANAEROBIC  5CC  Final   Culture NO GROWTH 2 DAYS  Final   Report Status PENDING  Incomplete  MRSA PCR Screening     Status:  Abnormal   Collection Time: 10/30/16  3:57 PM  Result Value Ref Range Status   MRSA by PCR POSITIVE (A) NEGATIVE Final    Comment:        The GeneXpert MRSA Assay (FDA approved for NASAL specimens only), is one component of a comprehensive MRSA colonization surveillance program. It is not intended to diagnose MRSA infection nor to guide or monitor treatment for MRSA infections. RESULT CALLED TO, READ BACK BY AND VERIFIED WITH: B.SCHERER,RN AT 1848 BY L.PITT 10/30/16      Radiology Studies: Dg Chest 2 View  Result Date: 11/01/2016 CLINICAL DATA:  Followup of healthcare associated pneumonia, respiratory failure, symptomatically improved, history CHF, hypertension, diabetes mellitus, atrial fibrillation, renal disease EXAM: CHEST  2 VIEW COMPARISON:  10/31/2016 FINDINGS:  Enlargement of cardiac silhouette. Mediastinal contours normal. Persistent LEFT upper lobe infiltrate consistent with pneumonia. Mild RIGHT upper lobe infiltrate persists with improved aeration at both lung bases since previous exam. No pleural effusion or pneumothorax. Bones demineralized. IMPRESSION: Persistent BILATERAL upper lobe infiltrates LEFT greater than RIGHT consistent with pneumonia. Improved bibasilar aeration. Electronically Signed   By: Lavonia Dana M.D.   On: 11/01/2016 08:17   Dg Chest Port 1 View  Result Date: 10/31/2016 CLINICAL DATA:  Respiratory distress, pneumonia, CHF, diabetes EXAM: PORTABLE CHEST 1 VIEW COMPARISON:  10/30/2016 FINDINGS: Worsening patchy and consolidative airspace process throughout the left lung and the right hilum compatible with bilateral pneumonia. Low lung volumes persist. No developing effusion or pneumothorax. Trachea is midline. Heart is enlarged. Aortic atherosclerosis noted. IMPRESSION: Worsening diffuse left lung and right hilar airspace process compatible with pneumonia Electronically Signed   By: Jerilynn Mages.  Shick M.D.   On: 10/31/2016 09:35   Dg Chest Port 1 View  Result Date:  10/30/2016 CLINICAL DATA:  Shortness of breath, wheezing x4 days EXAM: PORTABLE CHEST 1 VIEW COMPARISON:  07/19/2016 FINDINGS: Patchy left upper lobe and right infrahilar opacity, suspicious for pneumonia. No pleural effusion or pneumothorax. The heart is normal in size. IMPRESSION: Patchy left upper lobe and right infrahilar opacity, suspicious for pneumonia. Electronically Signed   By: Julian Hy M.D.   On: 10/30/2016 12:26    Scheduled Meds: . apixaban  5 mg Oral BID  . aztreonam  2 g Intravenous Q8H  . carvedilol  3.125 mg Oral BID WC  . Chlorhexidine Gluconate Cloth  6 each Topical Q0600  . ezetimibe  10 mg Oral Daily  . fenofibrate  160 mg Oral Daily  . furosemide  20 mg Oral Daily  . gabapentin  300 mg Oral TID  . insulin aspart  0-5 Units Subcutaneous QHS  . insulin aspart  0-9 Units Subcutaneous TID WC  . [START ON 11/02/2016] insulin aspart protamine- aspart  50 Units Subcutaneous Q breakfast   And  . insulin aspart protamine- aspart  40 Units Subcutaneous Q lunch  . insulin aspart protamine- aspart  50 Units Subcutaneous Q supper  . latanoprost  1 drop Both Eyes QHS  . magnesium oxide  400 mg Oral BID  . mouth rinse  15 mL Mouth Rinse BID  . [START ON 11/02/2016] methylPREDNISolone (SOLU-MEDROL) injection  125 mg Intravenous BID  . mupirocin ointment  1 application Nasal BID  . omega-3 acid ethyl esters  1 capsule Oral BID  . pantoprazole  40 mg Oral Daily  . PARoxetine  40 mg Oral Daily  . potassium chloride SA  20 mEq Oral Daily  . ranolazine  500 mg Oral BID  . sodium chloride flush  3 mL Intravenous Q12H  . tamsulosin  0.4 mg Oral Daily  . topiramate  25 mg Oral BID  . vancomycin  750 mg Intravenous Q12H   Continuous Infusions: . sodium chloride Stopped (10/31/16 1335)    LOS: 2 days   Time spent: 25 minutes.  Vance Gather, MD [facility] Pager [phone]  If 7PM-7AM, please contact night-coverage [email or web address] Password [email or web address] 11/01/2016, 11:51  AM

## 2016-11-02 ENCOUNTER — Other Ambulatory Visit: Payer: Self-pay | Admitting: *Deleted

## 2016-11-02 LAB — RENAL FUNCTION PANEL
ANION GAP: 7 (ref 5–15)
Albumin: 2.3 g/dL — ABNORMAL LOW (ref 3.5–5.0)
BUN: 29 mg/dL — ABNORMAL HIGH (ref 6–20)
CALCIUM: 8.8 mg/dL — AB (ref 8.9–10.3)
CO2: 25 mmol/L (ref 22–32)
Chloride: 102 mmol/L (ref 101–111)
Creatinine, Ser: 1.4 mg/dL — ABNORMAL HIGH (ref 0.61–1.24)
GFR calc non Af Amer: 49 mL/min — ABNORMAL LOW (ref >60)
GFR, EST AFRICAN AMERICAN: 57 mL/min — AB (ref >60)
GLUCOSE: 156 mg/dL — AB (ref 65–99)
PHOSPHORUS: 1.9 mg/dL — AB (ref 2.5–4.6)
POTASSIUM: 3.7 mmol/L (ref 3.5–5.1)
Sodium: 134 mmol/L — ABNORMAL LOW (ref 135–145)

## 2016-11-02 LAB — GLUCOSE, CAPILLARY
GLUCOSE-CAPILLARY: 187 mg/dL — AB (ref 65–99)
GLUCOSE-CAPILLARY: 230 mg/dL — AB (ref 65–99)
GLUCOSE-CAPILLARY: 226 mg/dL — AB (ref 65–99)
GLUCOSE-CAPILLARY: 149 mg/dL — AB (ref 65–99)
Glucose-Capillary: 258 mg/dL — ABNORMAL HIGH (ref 65–99)

## 2016-11-02 LAB — CBC WITH DIFFERENTIAL/PLATELET
BASOS ABS: 0 10*3/uL (ref 0.0–0.1)
BASOS PCT: 0 %
EOS ABS: 0 10*3/uL (ref 0.0–0.7)
Eosinophils Relative: 0 %
HEMATOCRIT: 33.4 % — AB (ref 39.0–52.0)
HEMOGLOBIN: 11.4 g/dL — AB (ref 13.0–17.0)
LYMPHS PCT: 8 %
Lymphs Abs: 1.9 10*3/uL (ref 0.7–4.0)
MCH: 30.4 pg (ref 26.0–34.0)
MCHC: 34.1 g/dL (ref 30.0–36.0)
MCV: 89.1 fL (ref 78.0–100.0)
MONOS PCT: 3 %
Monocytes Absolute: 0.7 10*3/uL (ref 0.1–1.0)
NEUTROS ABS: 21.4 10*3/uL — AB (ref 1.7–7.7)
NEUTROS PCT: 89 %
Platelets: 243 10*3/uL (ref 150–400)
RBC: 3.75 MIL/uL — ABNORMAL LOW (ref 4.22–5.81)
RDW: 13.7 % (ref 11.5–15.5)
WBC: 24 10*3/uL — ABNORMAL HIGH (ref 4.0–10.5)

## 2016-11-02 LAB — URINE CULTURE: Culture: NO GROWTH

## 2016-11-02 LAB — VANCOMYCIN, TROUGH: Vancomycin Tr: 20 ug/mL (ref 15–20)

## 2016-11-02 MED ORDER — METHYLPREDNISOLONE SODIUM SUCC 125 MG IJ SOLR
60.0000 mg | Freq: Two times a day (BID) | INTRAMUSCULAR | Status: DC
  Administered 2016-11-02: 60 mg via INTRAVENOUS
  Filled 2016-11-02: qty 2

## 2016-11-02 NOTE — Progress Notes (Signed)
TCTS BRIEF PROGRESS NOTE   Patient is well known to me from recent outpatient consultation for possible surgical treatment of multivessel CAD.  We will postpone any plans for surgery indefinitely and reconsider once he has completely recovered from his current illness.  He would not be considered a candidate for urgent or emergent CABG should he develop unstable angina in the setting of acute pneumonia.  He does have severe 3-vessel CAD with tight blockages in all 3 major coronary artery territories.  I would recommend cardiology consultation during this hospitalization if he develops any acute cardiac symptoms or complications.  Rexene Alberts, MD 11/02/2016 11:17 AM

## 2016-11-02 NOTE — Progress Notes (Signed)
Patient transported to [place] via bed. All personal items sent with patient, including but not limited to his home CPAP and clothing. Medications also sent with patient. Patient informed wife of his new room assignment. Report given to nurse accepting patient on [place]. Patient A/o X 4 and in no distress. Been on room air all day and denies any problems with SOB.

## 2016-11-02 NOTE — Progress Notes (Signed)
Patient has CPAP unit, set up  For 13 cmH20 (as per home use), and within reach.  Patient prefers to self-administer CPAP when ready for bed.  Patient is familiar with equipment and procedure.  Room air.

## 2016-11-02 NOTE — Progress Notes (Signed)
Pt BP 176/98, asymptomatic. PRN hydralazine in place for systolic >994 or diastolic >129. Paged MD to make him aware. Will continue to monitor.

## 2016-11-02 NOTE — Progress Notes (Signed)
Pharmacy Antibiotic Note  Julian Ross is a 70 y.o. male admitted on 10/30/2016 with SOB and productive cough.   Day # 4 of broad spectrum antibiotics  Vancomycin trough therapeutic  Plan: Continue Vancomycin  750 mg IV q12h Continue Aztreonam 2g IV q8h  Height: 5\' 11"  (180.3 cm) Weight: 213 lb 6.5 oz (96.8 kg) IBW/kg (Calculated) : 75.3  Temp (24hrs), Avg:97.4 F (36.3 C), Min:95.9 F (35.5 C), Max:98.1 F (36.7 C)   Recent Labs Lab 10/30/16 1131 10/30/16 1139 10/30/16 1214 10/30/16 1542 10/30/16 1632 10/31/16 0106 11/01/16 0758 11/02/16 0248 11/02/16 1257  WBC 14.9*  --   --  16.1*  --  18.9* 14.2* 24.0*  --   CREATININE 1.84* 1.70*  --   --   --  1.42* 1.48* 1.40*  --   LATICACIDVEN  --   --  2.59*  --  1.4  --   --   --   --   VANCOTROUGH  --   --   --   --   --   --   --   --  20    Estimated Creatinine Clearance: 58.3 mL/min (by C-G formula based on SCr of 1.4 mg/dL (H)).   Stable clearance  Allergies  Allergen Reactions  . Statins Other (See Comments)    Causes stiffness in joints   . Ace Inhibitors Cough  . Atenolol Other (See Comments)    Unknown reaction  . Contrast Media [Iodinated Diagnostic Agents] Rash    Has to take benadryl prior to use  . Penicillins Hives and Rash    Has patient had a PCN reaction causing immediate rash, facial/tongue/throat swelling, SOB or lightheadedness with hypotension: Yes Has patient had a PCN reaction causing severe rash involving mucus membranes or skin necrosis: No Has patient had a PCN reaction that required hospitalization pt was in the hospital at time of last reaction - heart attack Has patient had a PCN reaction occurring within the last 10 years: No If all of the above answers are "NO", then may proceed with Cephalosporin use.    Antimicrobials this admission:  10/31 Vancomycin >> 10/31 Aztreonam >> 10/31 Levofloxacin x1  Dose adjustments this admission:  N/A  Microbiology results:  10/31 BCx:  Sent 10/31 UCx: Sent  Thank you Anette Guarneri, PharmD [phone]  11/02/2016 2:11 PM

## 2016-11-02 NOTE — Progress Notes (Signed)
3E called to give report. Nurse not available at this time.

## 2016-11-02 NOTE — Progress Notes (Signed)
PROGRESS NOTE  Jadd Gasior  JIR:[id number] DOB: 1946-08-05 DOA: 10/30/2016 PCP: Kandice Hams, MD   Brief Narrative: Julian Ross is a 70 y.o. male with medical history significant for rheumatoid arthritis on methotrexate, prednisone and leucovorin; sleep apnea on CPAP, hypertension, chronic combined heart failure, AFib on eliquis, CKD with associated anemia, dyslipidemia, and recently diagnosed multivessel CAD. He presented with 3 days of shortness of breath with increasing fatigue and dyspnea on exertion with cough of productive rusty brown sputum. His wife has noticed altered mentation and decreased urinary output. CXR showed patchy left upper lobe and right infrahilar opacity suspicious for pneumonia, Cr 1.7, poc troponin 0.21, initial lactic acid 2.59, WBCs 14,900 with neutrophils 84% and absolute neutrophils 12.5%. Blood cultures obtained, levaquin and nebs given and he was admitted for treatment of sepsis due to HCAP. Levaquin was replaced by vanc/aztreonam, and IVF's were provided. On the morning following admission he was given multiple benzodiazepines which caused drowsiness prompting transfer to SDU. ABG at that time was normal and no BiPAP was required. Mental status and overall clinical picture has improved and he was transferred back to the floor 11/3.   Assessment & Plan: Principal Problem:   HCAP (healthcare-associated pneumonia) Active Problems:   Rheumatoid arthritis of multiple sites with negative rheumatoid factor (HCC)   Coronary artery disease of native artery of native heart with stable angina pectoris (HCC)   Paroxysmal atrial fibrillation (HCC)   Chronic combined systolic and diastolic CHF (congestive heart failure) (HCC)   CKD (chronic kidney disease) stage 3, GFR 30-59 ml/min   Anemia of chronic kidney failure   Benign essential HTN   Dyslipidemia associated with type 2 diabetes mellitus (HCC)   Acute hyponatremia   OSA on CPAP   Sepsis due to pneumonia ([place])  Diabetes mellitus type 2, insulin dependent ([place])   Acute respiratory failure with hypoxia (HCC)  Sepsis and acute respiratory failure due to HCAP: Improving infectious indices. Saturating well on 2L oxygen. Bilateral pneumonia on chest x-ray in immunosuppressed pt s/p LHC 10/4. Note MRSA swab+ and rapidly expansive airspace opacities concerning for MRSA pneumonia, being covered by vancomycin. - Vanc/aztreonam 10/31 >>  - Follow blood, sputum cultures, urine Ag's neg. - Follow PCT, considering de-escalation of abx 11/4. - Nebs prn - Oxygen prn: at 2L by [place], wean as tolerated. - History of ARDS in the past so monitor closely for worsening condition - Repeat CXR 11/2 shows improved aeration with persistent bilateral infiltrates. Will rpeat in AM - Incentive spirometry - Ambulate - PT  Acute encephalopathy: Pt with diminished arousal and labored breathing 11/1 AM, transferred to SDU for possible initiation of NIPPV fearing hypercarbia. ABG reassuring, and pt has improved significantly. Likely due to sedating medications. Resolved. - Hold sedating medications  Seronegative rheumatoid arthritis of multiple sites: - Given stress-dose steroids with rapid taper over next 2 days, then continue preadmission prednisone. - Receives weekly methotrexate with leucovorin - not reordered during admission  Multivessel native CAD with stable angina pectoris: Demand ischemia with mild troponin elevation in setting of sepsis but no active ACS. CABG planned in near future.  - Continue preadmission carvedilol, TriCor, omega-3 fatty acids, zetia and Ranexa - Flat troponin trend reassuring. Low threshold for cardiology consult if chest pain develops. - TCTS, Dr. [name], visited today. CABG on indefinite hold pending recovery from hospitalization.  OSA on CPAP - Continue nocturnal CPAP  IDDM: March HbA1c 9.2%, CBGs significantly elevated on reduced insulin doses started at admission, taking reliable po.  -  Utilizes Humalog 50/50 mix at home and instead will utilize 70/30 mix here: 50-40-50 (breakfast, lunch, supper) - Follow CBGs and provide SSI - Continue neurontin for peripheral neuropathy   Paroxysmal atrial fibrillation: Rate-controlled. CHADsVASc 5. - Continue coreg and eliquis  Chronic combined systolic and diastolic CHF (congestive heart failure): July 2017 EF 50-55%, inferior akinesis and G2DD. Low suspicion for decompensation at admission, but airspace opacities bilaterally noted on CXR this AM.  - D/C IVF, give lasix 80mg  IV x1, restarted home lasix - Repeat CXR in AM   Benign essential HTN -As above  Acute hyponatremia: Mild. Improving. -Suspect pseudohyponatremia and setting of elevated glucose as well as a degree of hyponatremia from dehydration - Follow chemistries  Stage III CKD Renal function stable and at baseline - Monitor daily  BPH -Continue Flomax  Anemia of chronic kidney failure -Hemoglobin stable and at baseline  Dyslipidemia associated with type 2 diabetes mellitus - Statin allergy noted - Continue preadmission omega-3 fatty acids, zetia and TriCor  DVT prophylaxis: Eliquis Code Status: DNR confirmed at admission. NIPPV ok. Family Communication: Wife at bedside Disposition Plan: Anticipate D/C back to home when stable, possibly 48 hours.  Consultants:   None  Procedures:   None  Antimicrobials:  Levaquin 10/31 x1  Aztreonam 10/31 >>   Vancomycin 10/31 >>  Subjective: Pt generally slowly improving, hasn't been up OOB much at all. No chest pain.   Objective: Vitals:   11/02/16 1129 11/02/16 1207 11/02/16 1211 11/02/16 1659  BP: (!) 142/95  (!) 179/98 (!) 176/98  Pulse:   85 89  Resp:   18 18  Temp:   97.5 F (36.4 C) 97.9 F (36.6 C)  TempSrc:   Oral Oral  SpO2:   96% 100%  Weight:  96.8 kg (213 lb 6.5 oz)    Height:  5\' 11"  (1.803 m)      Intake/Output Summary (Last 24 hours) at 11/02/16 1936 Last data filed at  11/02/16 1850  Gross per 24 hour  Intake             1770 ml  Output             3150 ml  Net            -1380 ml   Filed Weights   11/01/16 0346 11/02/16 0300 11/02/16 1207  Weight: 97.9 kg (215 lb 13.3 oz) 98.9 kg (218 lb 0.6 oz) 96.8 kg (213 lb 6.5 oz)    Examination: General exam: 70 y.o. male  alert, eating lunch in no distress Respiratory system: Nonlabored on room air. No crackles.  Cardiovascular system: Regular rate and rhythm. No murmur, rub, or gallop. No JVD, and No pedal edema. Gastrointestinal system: Abdomen soft, non-tender, non-distended, with normoactive bowel sounds. No organomegaly or masses felt. Central nervous system: Alert and oriented. No focal neurological deficits. Extremities: Warm, no deformities Skin: No rashes, lesions no ulcers Psychiatry: Mood & affect appropriate.   Data Reviewed: I have personally reviewed following labs and imaging studies  CBC:  Recent Labs Lab 10/30/16 1131 10/30/16 1139 10/30/16 1542 10/31/16 0106 11/01/16 0758 11/02/16 0248  WBC 14.9*  --  16.1* 18.9* 14.2* 24.0*  NEUTROABS 12.5*  --  12.8*  --   --  21.4*  HGB 12.3* 13.3 13.3 12.6* 12.1* 11.4*  HCT 36.4* 39.0 38.8* 36.2* 35.4* 33.4*  MCV 91.9  --  91.3 89.2 88.9 89.1  PLT 212  --  214 230 232 161   Basic Metabolic  Panel:  Recent Labs Lab 10/30/16 1131 10/30/16 1139 10/31/16 0106 11/01/16 0758 11/02/16 0248  NA 130* 129* 129* 133* 134*  K 4.0 4.1 3.5 3.9 3.7  CL 93* 92* 95* 99* 102  CO2 26  --  20* 24 25  GLUCOSE 225* 215* 192* 287* 156*  BUN 22* 25* 18 24* 29*  CREATININE 1.84* 1.70* 1.42* 1.48* 1.40*  CALCIUM 9.0  --  8.6* 9.1 8.8*  PHOS  --   --   --   --  1.9*   GFR: Estimated Creatinine Clearance: 58.3 mL/min (by C-G formula based on SCr of 1.4 mg/dL (H)). Liver Function Tests:  Recent Labs Lab 10/30/16 1131 10/31/16 0106 11/02/16 0248  AST 74* 83*  --   ALT 26 28  --   ALKPHOS 37* 41  --   BILITOT 0.8 1.4*  --   PROT 7.2 7.0  --     ALBUMIN 3.4* 3.1* 2.3*   No results for input(s): LIPASE, AMYLASE in the last 168 hours. No results for input(s): AMMONIA in the last 168 hours. Coagulation Profile:  Recent Labs Lab 10/30/16 1131  INR 1.75   Cardiac Enzymes:  Recent Labs Lab 10/30/16 1542 10/30/16 1846 10/31/16 0106  TROPONINI 0.13* 0.11* 0.12*   BNP (last 3 results) No results for input(s): PROBNP in the last 8760 hours. HbA1C:  Recent Labs  10/31/16 0106  HGBA1C 7.7*   CBG:  Recent Labs Lab 11/01/16 2140 11/02/16 0810 11/02/16 1127 11/02/16 1212 11/02/16 1656  GLUCAP 193* 187* 230* 226* 258*   Lipid Profile: No results for input(s): CHOL, HDL, LDLCALC, TRIG, CHOLHDL, LDLDIRECT in the last 72 hours. Thyroid Function Tests: No results for input(s): TSH, T4TOTAL, FREET4, T3FREE, THYROIDAB in the last 72 hours. Anemia Panel: No results for input(s): VITAMINB12, FOLATE, FERRITIN, TIBC, IRON, RETICCTPCT in the last 72 hours. Urine analysis:    Component Value Date/Time   COLORURINE YELLOW 10/30/2016 [place] 10/30/2016 2215   LABSPEC 1.008 10/30/2016 2215   PHURINE 6.5 10/30/2016 2215   GLUCOSEU NEGATIVE 10/30/2016 2215   HGBUR SMALL (A) 10/30/2016 2215   BILIRUBINUR NEGATIVE 10/30/2016 [place] 10/30/2016 2215   PROTEINUR 100 (A) 10/30/2016 2215   NITRITE NEGATIVE 10/30/2016 2215   LEUKOCYTESUR NEGATIVE 10/30/2016 2215   Sepsis Labs: @LABRCNTIP (procalcitonin:4,lacticidven:4)  ) Recent Results (from the past 240 hour(s))  Blood Culture (routine x 2)     Status: None (Preliminary result)   Collection Time: 10/30/16 12:21 PM  Result Value Ref Range Status   Specimen Description BLOOD RIGHT ANTECUBITAL  Final   Special Requests BOTTLES DRAWN AEROBIC AND ANAEROBIC  5CC  Final   Culture NO GROWTH 3 DAYS  Final   Report Status PENDING  Incomplete  Blood Culture (routine x 2)     Status: None (Preliminary result)   Collection Time: 10/30/16 12:30 PM   Result Value Ref Range Status   Specimen Description BLOOD RIGHT WRIST  Final   Special Requests BOTTLES DRAWN AEROBIC AND ANAEROBIC  5CC  Final   Culture NO GROWTH 3 DAYS  Final   Report Status PENDING  Incomplete  MRSA PCR Screening     Status: Abnormal   Collection Time: 10/30/16  3:57 PM  Result Value Ref Range Status   MRSA by PCR POSITIVE (A) NEGATIVE Final    Comment:        The GeneXpert MRSA Assay (FDA approved for NASAL specimens only), is one component of a comprehensive  MRSA colonization surveillance program. It is not intended to diagnose MRSA infection nor to guide or monitor treatment for MRSA infections. RESULT CALLED TO, READ BACK BY AND VERIFIED WITH: B.SCHERER,RN AT 1848 BY L.PITT 10/30/16   Urine culture     Status: None   Collection Time: 10/30/16 10:15 PM  Result Value Ref Range Status   Specimen Description URINE, RANDOM  Final   Special Requests ADDED [id number]  Final   Culture NO GROWTH  Final   Report Status 11/02/2016 FINAL  Final     Radiology Studies: Dg Chest 2 View  Result Date: 11/01/2016 CLINICAL DATA:  Followup of healthcare associated pneumonia, respiratory failure, symptomatically improved, history CHF, hypertension, diabetes mellitus, atrial fibrillation, renal disease EXAM: CHEST  2 VIEW COMPARISON:  10/31/2016 FINDINGS: Enlargement of cardiac silhouette. Mediastinal contours normal. Persistent LEFT upper lobe infiltrate consistent with pneumonia. Mild RIGHT upper lobe infiltrate persists with improved aeration at both lung bases since previous exam. No pleural effusion or pneumothorax. Bones demineralized. IMPRESSION: Persistent BILATERAL upper lobe infiltrates LEFT greater than RIGHT consistent with pneumonia. Improved bibasilar aeration. Electronically Signed   By: Lavonia Dana M.D.   On: 11/01/2016 08:17    Scheduled Meds: . apixaban  5 mg Oral BID  . aztreonam  2 g Intravenous Q8H  . carvedilol  3.125 mg Oral BID WC  .  Chlorhexidine Gluconate Cloth  6 each Topical Q0600  . ezetimibe  10 mg Oral Daily  . fenofibrate  160 mg Oral Daily  . furosemide  20 mg Oral Daily  . gabapentin  300 mg Oral TID  . insulin aspart  0-5 Units Subcutaneous QHS  . insulin aspart  0-9 Units Subcutaneous TID WC  . insulin aspart protamine- aspart  50 Units Subcutaneous Q breakfast   And  . insulin aspart protamine- aspart  40 Units Subcutaneous Q lunch  . insulin aspart protamine- aspart  50 Units Subcutaneous Q supper  . latanoprost  1 drop Both Eyes QHS  . magnesium oxide  400 mg Oral BID  . mouth rinse  15 mL Mouth Rinse BID  . methylPREDNISolone (SOLU-MEDROL) injection  60 mg Intravenous BID  . mupirocin ointment  1 application Nasal BID  . omega-3 acid ethyl esters  1 capsule Oral BID  . pantoprazole  40 mg Oral Daily  . PARoxetine  40 mg Oral Daily  . potassium chloride SA  20 mEq Oral Daily  . ranolazine  500 mg Oral BID  . sodium chloride flush  3 mL Intravenous Q12H  . tamsulosin  0.4 mg Oral Daily  . topiramate  25 mg Oral BID  . vancomycin  750 mg Intravenous Q12H   Continuous Infusions: . sodium chloride Stopped (10/31/16 1335)    LOS: 3 days   Time spent: 25 minutes.  Vance Gather, MD [facility] Pager [phone]  If 7PM-7AM, please contact night-coverage [email or web address] Password [facility] 11/02/2016, 7:36 PM

## 2016-11-03 ENCOUNTER — Inpatient Hospital Stay (HOSPITAL_COMMUNITY): Payer: Medicare Other

## 2016-11-03 LAB — PROCALCITONIN: Procalcitonin: 0.27 ng/mL

## 2016-11-03 LAB — BASIC METABOLIC PANEL
Anion gap: 3 — ABNORMAL LOW (ref 5–15)
BUN: 31 mg/dL — AB (ref 6–20)
CHLORIDE: 107 mmol/L (ref 101–111)
CO2: 27 mmol/L (ref 22–32)
CREATININE: 1.37 mg/dL — AB (ref 0.61–1.24)
Calcium: 9.3 mg/dL (ref 8.9–10.3)
GFR calc Af Amer: 59 mL/min — ABNORMAL LOW (ref >60)
GFR calc non Af Amer: 51 mL/min — ABNORMAL LOW (ref >60)
Glucose, Bld: 84 mg/dL (ref 65–99)
POTASSIUM: 4 mmol/L (ref 3.5–5.1)
SODIUM: 137 mmol/L (ref 135–145)

## 2016-11-03 LAB — GLUCOSE, CAPILLARY
GLUCOSE-CAPILLARY: 96 mg/dL (ref 65–99)
GLUCOSE-CAPILLARY: 103 mg/dL — AB (ref 65–99)
GLUCOSE-CAPILLARY: 93 mg/dL (ref 65–99)
GLUCOSE-CAPILLARY: 45 mg/dL — AB (ref 65–99)
GLUCOSE-CAPILLARY: 52 mg/dL — AB (ref 65–99)
Glucose-Capillary: 90 mg/dL (ref 65–99)

## 2016-11-03 MED ORDER — CARVEDILOL 6.25 MG PO TABS
6.2500 mg | ORAL_TABLET | Freq: Two times a day (BID) | ORAL | Status: DC
Start: 2016-11-03 — End: 2016-11-04
  Administered 2016-11-03 – 2016-11-04 (×3): 6.25 mg via ORAL
  Filled 2016-11-03 (×3): qty 1

## 2016-11-03 MED ORDER — FUROSEMIDE 20 MG PO TABS
20.0000 mg | ORAL_TABLET | Freq: Once | ORAL | Status: AC
  Administered 2016-11-03: 20 mg via ORAL
  Filled 2016-11-03: qty 1

## 2016-11-03 MED ORDER — CARVEDILOL 3.125 MG PO TABS: 3.1250 mg | ORAL_TABLET | Freq: Two times a day (BID) | ORAL | Status: DC

## 2016-11-03 MED ORDER — LEVOFLOXACIN 750 MG PO TABS
750.0000 mg | ORAL_TABLET | Freq: Every day | ORAL | Status: DC
  Administered 2016-11-03 – 2016-11-04 (×2): 750 mg via ORAL
  Filled 2016-11-03 (×2): qty 1

## 2016-11-03 MED ORDER — PREDNISONE 5 MG PO TABS: 5.0000 mg | ORAL_TABLET | Freq: Every day | ORAL | Status: DC

## 2016-11-03 MED ORDER — PREDNISONE 5 MG PO TABS
5.0000 mg | ORAL_TABLET | Freq: Every day | ORAL | Status: DC
  Administered 2016-11-03 – 2016-11-04 (×2): 5 mg via ORAL
  Filled 2016-11-03 (×2): qty 1

## 2016-11-03 MED ORDER — INSULIN ASPART 100 UNIT/ML ~~LOC~~ SOLN: 0.0000 [IU] | Freq: Three times a day (TID) | SUBCUTANEOUS | Status: DC

## 2016-11-03 NOTE — Progress Notes (Signed)
Hypoglycemic Event  CBG: 52  Treatment: soda, graham crackers and peanut butter  Symptoms: None  Follow-up CBG: Time:2200 CBG Result:90  Possible Reasons for Event: Medication regimen: insulin  Comments/MD notified:Hypoglycemic protocol    [name]

## 2016-11-03 NOTE — Progress Notes (Signed)

## 2016-11-03 NOTE — Progress Notes (Signed)
Hypoglycemic Event  CBG: 45  Treatment: 4oz soda, graham cracker  Symptoms: None  Follow-up CBG: Time:2120 CBG Result:52  Possible Reasons for Event: Medication regimen: insulin  Comments/MD notified:Hypoglycemia Protocol    [name]

## 2016-11-03 NOTE — Progress Notes (Signed)
PROGRESS NOTE  Julian Ross  HYI:[id number] DOB: [birth year] DOA: 10/30/2016 PCP: Kandice Hams, MD   Brief Narrative: Julian Ross is a 70 y.o. male with medical history significant for rheumatoid arthritis on methotrexate, prednisone and leucovorin; sleep apnea on CPAP, hypertension, chronic combined heart failure, AFib on eliquis, CKD with associated anemia, dyslipidemia, and recently diagnosed multivessel CAD. He presented with 3 days of shortness of breath with increasing fatigue and dyspnea on exertion with cough of productive rusty brown sputum. His wife has noticed altered mentation and decreased urinary output. CXR showed patchy left upper lobe and right infrahilar opacity suspicious for pneumonia, Cr 1.7, poc troponin 0.21, initial lactic acid 2.59, WBCs 14,900 with neutrophils 84% and absolute neutrophils 12.5%. Blood cultures obtained, levaquin and nebs given and he was admitted for treatment of sepsis due to HCAP. Levaquin was replaced by vanc/aztreonam, and IVF's were provided. On the morning following admission he was given multiple benzodiazepines which caused drowsiness prompting transfer to SDU. ABG at that time was normal and no BiPAP was required. Mental status and overall clinical picture has improved and he was transferred back to the floor 11/3. IV vanc/aztreonam were transitioned to levaquin 11/4.   Assessment & Plan: Principal Problem:   HCAP (healthcare-associated pneumonia) Active Problems:   Rheumatoid arthritis of multiple sites with negative rheumatoid factor (HCC)   Coronary artery disease of native artery of native heart with stable angina pectoris (HCC)   Paroxysmal atrial fibrillation (HCC)   Chronic combined systolic and diastolic CHF (congestive heart failure) (HCC)   CKD (chronic kidney disease) stage 3, GFR 30-59 ml/min   Anemia of chronic kidney failure   Benign essential HTN   Dyslipidemia associated with type 2 diabetes mellitus (HCC)   Acute  hyponatremia   OSA on CPAP   Sepsis due to pneumonia ([place])   Diabetes mellitus type 2, insulin dependent ([place])   Acute respiratory failure with hypoxia (HCC)  Sepsis and acute respiratory failure due to HCAP: Improving infectious indices. Saturating well on 2L oxygen. Bilateral pneumonia on chest x-ray in immunosuppressed pt s/p LHC 10/4. Note MRSA swab+ and rapidly expansive airspace opacities concerning for MRSA pneumonia, being covered by vancomycin. History of ARDS in the past so monitor closely for worsening condition. PCT improving, down to 0.27 11/4. CXR 11/4 with continued left-side diffuse opacity with improving infiltrates.  - Vanc/aztreonam 10/31 >> levaquin 11/4 >>  - Follow blood, sputum cultures, urine Ag's neg. - Nebs prn - Oxygen prn: Off oxygen as of 11/3 - Incentive spirometry - Ambulate - PT  Acute encephalopathy: Pt with diminished arousal and labored breathing 11/1 AM, transferred to SDU for possible initiation of NIPPV fearing hypercarbia. ABG reassuring, and pt has improved significantly. Likely due to sedating medications. Resolved. - Hold sedating medications  Seronegative rheumatoid arthritis of multiple sites: - Given stress-dose steroids with rapid taper,now back to preadmission prednisone. - Receives weekly methotrexate with leucovorin - not reordered during admission  Multivessel native CAD with stable angina pectoris: Demand ischemia with mild troponin elevation in setting of sepsis but no active ACS. CABG planned in near future.  - Continue preadmission carvedilol, TriCor, omega-3 fatty acids, zetia and Ranexa - Flat troponin trend reassuring. Low threshold for cardiology consult if chest pain develops. - TCTS, Dr. [name], visited. CABG on indefinite hold pending recovery from hospitalization.  OSA on CPAP - Continue nocturnal CPAP  IDDM: March HbA1c 9.2%, CBGs significantly elevated on reduced insulin doses started at admission, taking reliable po.  -  Utilizes Humalog 50/50 mix at home and instead will utilize 70/30 mix here: 50-40-50 (breakfast, lunch, supper) - Follow CBGs and provide SSI - Continue neurontin for peripheral neuropathy   Paroxysmal atrial fibrillation: Rate-controlled. CHADsVASc 5. - Continue coreg and eliquis  Chronic combined systolic and diastolic CHF (congestive heart failure): July 2017 EF 50-55%, inferior akinesis and G2DD. Low suspicion for decompensation at admission, but airspace opacities bilaterally noted on CXR this AM.  - restarted home lasix - Increased crackles this AM, will repeat lasix po x1 today.  Benign essential HTN -As above  Acute hyponatremia: Mild. Improving. -Suspect pseudohyponatremia and setting of elevated glucose as well as a degree of hyponatremia from dehydration - Follow chemistries  Stage III CKD Renal function stable and at baseline - Monitor Julian  BPH -Continue Flomax  Anemia of chronic kidney failure -Hemoglobin stable and at baseline  Dyslipidemia associated with type 2 diabetes mellitus - Statin allergy noted - Continue preadmission omega-3 fatty acids, zetia and TriCor  DVT prophylaxis: Eliquis Code Status: DNR confirmed at admission. NIPPV ok. Family Communication: Wife at bedside Disposition Plan: Anticipate D/C back to home when stable, possibly 24 hours.  Consultants:   None  Procedures:   None  Antimicrobials:  Levaquin 10/31 x1  Aztreonam 10/31 >> 11/4  Vancomycin 10/31 >> 11/4  Levaquin 11/4 >>   Subjective: Pt generally slowly improving, hasn't been up OOB much at all still. No chest pain.   Objective: Vitals:   11/02/16 2333 11/03/16 0452 11/03/16 0812 11/03/16 1139  BP: (!) 168/92 (!) 164/99 (!) 159/106 (!) 183/92  Pulse: 85 91 91 97  Resp: 18 18  20   Temp: 98.4 F (36.9 C) 98.1 F (36.7 C)  97.7 F (36.5 C)  TempSrc: Oral Oral  Oral  SpO2: 98% 98%  95%  Weight:  96.1 kg (211 lb 13.8 oz)    Height:         Intake/Output Summary (Last 24 hours) at 11/03/16 1431 Last data filed at 11/03/16 1401  Gross per 24 hour  Intake              720 ml  Output             3875 ml  Net            -3155 ml   Filed Weights   11/02/16 0300 11/02/16 1207 11/03/16 0452  Weight: 98.9 kg (218 lb 0.6 oz) 96.8 kg (213 lb 6.5 oz) 96.1 kg (211 lb 13.8 oz)    Examination: General exam: 70 y.o. male  alert, eating lunch in no distress Respiratory system: Nonlabored on room air. Bibasilar crackles.  Cardiovascular system: Regular rate and rhythm. No murmur, rub, or gallop. No JVD, and No pedal edema. Gastrointestinal system: Abdomen soft, non-tender, non-distended, with normoactive bowel sounds. No organomegaly or masses felt. Central nervous system: Alert and oriented. No focal neurological deficits. Extremities: Warm, no deformities Skin: No rashes, lesions no ulcers Psychiatry: Mood & affect appropriate.   Data Reviewed: I have personally reviewed following labs and imaging studies  CBC:  Recent Labs Lab 10/30/16 1131 10/30/16 1139 10/30/16 1542 10/31/16 0106 11/01/16 0758 11/02/16 0248  WBC 14.9*  --  16.1* 18.9* 14.2* 24.0*  NEUTROABS 12.5*  --  12.8*  --   --  21.4*  HGB 12.3* 13.3 13.3 12.6* 12.1* 11.4*  HCT 36.4* 39.0 38.8* 36.2* 35.4* 33.4*  MCV 91.9  --  91.3 89.2 88.9 89.1  PLT 212  --  214  230 232 503   Basic Metabolic Panel:  Recent Labs Lab 10/30/16 1131 10/30/16 1139 10/31/16 0106 11/01/16 0758 11/02/16 0248 11/03/16 0434  NA 130* 129* 129* 133* 134* 137  K 4.0 4.1 3.5 3.9 3.7 4.0  CL 93* 92* 95* 99* 102 107  CO2 26  --  20* 24 25 27   GLUCOSE 225* 215* 192* 287* 156* 84  BUN 22* 25* 18 24* 29* 31*  CREATININE 1.84* 1.70* 1.42* 1.48* 1.40* 1.37*  CALCIUM 9.0  --  8.6* 9.1 8.8* 9.3  PHOS  --   --   --   --  1.9*  --    GFR: Estimated Creatinine Clearance: 59.3 mL/min (by C-G formula based on SCr of 1.37 mg/dL (H)). Liver Function Tests:  Recent Labs Lab  10/30/16 1131 10/31/16 0106 11/02/16 0248  AST 74* 83*  --   ALT 26 28  --   ALKPHOS 37* 41  --   BILITOT 0.8 1.4*  --   PROT 7.2 7.0  --   ALBUMIN 3.4* 3.1* 2.3*   No results for input(s): LIPASE, AMYLASE in the last 168 hours. No results for input(s): AMMONIA in the last 168 hours. Coagulation Profile:  Recent Labs Lab 10/30/16 1131  INR 1.75   Cardiac Enzymes:  Recent Labs Lab 10/30/16 1542 10/30/16 1846 10/31/16 0106  TROPONINI 0.13* 0.11* 0.12*   BNP (last 3 results) No results for input(s): PROBNP in the last 8760 hours. HbA1C: No results for input(s): HGBA1C in the last 72 hours. CBG:  Recent Labs Lab 11/02/16 1212 11/02/16 1656 11/02/16 2059 11/03/16 0639 11/03/16 1137  GLUCAP 226* 258* 149* 96 103*   Lipid Profile: No results for input(s): CHOL, HDL, LDLCALC, TRIG, CHOLHDL, LDLDIRECT in the last 72 hours. Thyroid Function Tests: No results for input(s): TSH, T4TOTAL, FREET4, T3FREE, THYROIDAB in the last 72 hours. Anemia Panel: No results for input(s): VITAMINB12, FOLATE, FERRITIN, TIBC, IRON, RETICCTPCT in the last 72 hours. Urine analysis:    Component Value Date/Time   COLORURINE YELLOW 10/30/2016 [place] 10/30/2016 2215   LABSPEC 1.008 10/30/2016 2215   PHURINE 6.5 10/30/2016 2215   GLUCOSEU NEGATIVE 10/30/2016 2215   HGBUR SMALL (A) 10/30/2016 2215   BILIRUBINUR NEGATIVE 10/30/2016 [place] 10/30/2016 2215   PROTEINUR 100 (A) 10/30/2016 2215   NITRITE NEGATIVE 10/30/2016 2215   LEUKOCYTESUR NEGATIVE 10/30/2016 2215   Sepsis Labs: @LABRCNTIP (procalcitonin:4,lacticidven:4)  ) Recent Results (from the past 240 hour(s))  Blood Culture (routine x 2)     Status: None (Preliminary result)   Collection Time: 10/30/16 12:21 PM  Result Value Ref Range Status   Specimen Description BLOOD RIGHT ANTECUBITAL  Final   Special Requests BOTTLES DRAWN AEROBIC AND ANAEROBIC  5CC  Final   Culture NO GROWTH 4 DAYS   Final   Report Status PENDING  Incomplete  Blood Culture (routine x 2)     Status: None (Preliminary result)   Collection Time: 10/30/16 12:30 PM  Result Value Ref Range Status   Specimen Description BLOOD RIGHT WRIST  Final   Special Requests BOTTLES DRAWN AEROBIC AND ANAEROBIC  5CC  Final   Culture NO GROWTH 4 DAYS  Final   Report Status PENDING  Incomplete  MRSA PCR Screening     Status: Abnormal   Collection Time: 10/30/16  3:57 PM  Result Value Ref Range Status   MRSA by PCR POSITIVE (A) NEGATIVE Final    Comment:  The GeneXpert MRSA Assay (FDA approved for NASAL specimens only), is one component of a comprehensive MRSA colonization surveillance program. It is not intended to diagnose MRSA infection nor to guide or monitor treatment for MRSA infections. RESULT CALLED TO, READ BACK BY AND VERIFIED WITH: B.SCHERER,RN AT 1848 BY L.PITT 10/30/16   Urine culture     Status: None   Collection Time: 10/30/16 10:15 PM  Result Value Ref Range Status   Specimen Description URINE, RANDOM  Final   Special Requests ADDED [id number]  Final   Culture NO GROWTH  Final   Report Status 11/02/2016 FINAL  Final     Radiology Studies: Dg Chest 2 View  Result Date: 11/03/2016 CLINICAL DATA:  HCAP (healthcare-associated pneumonia); SOB EXAM: CHEST  2 VIEW COMPARISON:  11/01/2016 FINDINGS: Hazy airspace opacity in the left lung primarily in the left upper lobe, has subtly improved from the prior exam. Right central upper lobe airspace opacity is unchanged. There are no new lung abnormalities. No convincing pleural effusion.  No pneumothorax. IMPRESSION: 1. Slight improvement in the pneumonia in the left upper lobe since the prior exam. No other change. Electronically Signed   By: Lajean Manes M.D.   On: 11/03/2016 08:15    Scheduled Meds: . apixaban  5 mg Oral BID  . carvedilol  6.25 mg Oral BID WC  . Chlorhexidine Gluconate Cloth  6 each Topical Q0600  . ezetimibe  10 mg Oral Julian   . fenofibrate  160 mg Oral Julian  . furosemide  20 mg Oral Julian  . gabapentin  300 mg Oral TID  . insulin aspart  0-5 Units Subcutaneous QHS  . insulin aspart  0-9 Units Subcutaneous TID WC  . insulin aspart protamine- aspart  50 Units Subcutaneous Q breakfast   And  . insulin aspart protamine- aspart  40 Units Subcutaneous Q lunch  . insulin aspart protamine- aspart  50 Units Subcutaneous Q supper  . latanoprost  1 drop Both Eyes QHS  . levofloxacin  750 mg Oral Julian  . magnesium oxide  400 mg Oral BID  . mouth rinse  15 mL Mouth Rinse BID  . mupirocin ointment  1 application Nasal BID  . omega-3 acid ethyl esters  1 capsule Oral BID  . pantoprazole  40 mg Oral Julian  . PARoxetine  40 mg Oral Julian  . potassium chloride SA  20 mEq Oral Julian  . ranolazine  500 mg Oral BID  . sodium chloride flush  3 mL Intravenous Q12H  . tamsulosin  0.4 mg Oral Julian  . topiramate  25 mg Oral BID   Continuous Infusions: . sodium chloride Stopped (10/31/16 1335)    LOS: 4 days   Time spent: 25 minutes.  Vance Gather, MD [facility] Pager [phone]  If 7PM-7AM, please contact night-coverage [email or web address] Password [email or web address] 11/03/2016, 2:31 PM

## 2016-11-03 NOTE — Evaluation (Signed)
Physical Therapy Evaluation Patient Details Name: Julian Ross MRN: [id number] DOB: 1946/01/13 Today's Date: 11/03/2016   History of Present Illness  Pt is a 70 y/o male admitted secondary to SOB and HCAP. PMH including but not limited to RA, HTN, CHF, A-fib, CKD and CAD.  Clinical Impression  Pt presented supine in bed with HOB elevated, awake and willing to participate in therapy session. Prior to admission, pt stated that he was mod I for all functional mobility with use of SPC for ambulation. Pt moving well during evaluation with min guard for safety for all functional mobility. Pt would continue to benefit from skilled physical therapy services at this time while admitted to address his below listed limitations in order to improve his overall safety and independence with functional mobility.       Follow Up Recommendations Supervision for mobility/OOB    Equipment Recommendations  None recommended by PT    Recommendations for Other Services       Precautions / Restrictions Precautions Precautions: Fall Restrictions Weight Bearing Restrictions: No      Mobility  Bed Mobility Overal bed mobility: Needs Assistance Bed Mobility: Supine to Sit     Supine to sit: Supervision;HOB elevated     General bed mobility comments: pt required increased time and supervision for safety  Transfers Overall transfer level: Needs assistance Equipment used: Rolling walker (2 wheeled) Transfers: Sit to/from Stand Sit to Stand: Min guard         General transfer comment: pt required increased time, VC'ing for bilateral hand placement and min guard for safety  Ambulation/Gait Ambulation/Gait assistance: Min guard Ambulation Distance (Feet): 100 Feet Assistive device: Rolling walker (2 wheeled) Gait Pattern/deviations: Step-through pattern;Decreased stride length Gait velocity: decreased   General Gait Details: pt demonstrated safety with use of RW, no LOB  Stairs             Wheelchair Mobility    Modified Rankin (Stroke Patients Only)       Balance Overall balance assessment: Needs assistance Sitting-balance support: Feet supported;No upper extremity supported Sitting balance-Leahy Scale: Good     Standing balance support: During functional activity;Bilateral upper extremity supported Standing balance-Leahy Scale: Poor Standing balance comment: pt reliant on bilateral UEs on RW                             Pertinent Vitals/Pain Pain Assessment: No/denies pain    Home Living Family/patient expects to be discharged to:: Private residence Living Arrangements: Spouse/significant other Available Help at Discharge: Family;Available 24 hours/day Type of Home: House Home Access: Level entry     Home Layout: One level Home Equipment: Walker - 2 wheels;Cane - single point      Prior Function Level of Independence: Independent with assistive device(s)         Comments: pt stated that he would use a SPC to ambulate     Hand Dominance        Extremity/Trunk Assessment   Upper Extremity Assessment: Overall WFL for tasks assessed           Lower Extremity Assessment: Overall WFL for tasks assessed         Communication   Communication: No difficulties  Cognition Arousal/Alertness: Awake/alert Behavior During Therapy: WFL for tasks assessed/performed Overall Cognitive Status: Within Functional Limits for tasks assessed                      General  Comments      Exercises     Assessment/Plan    PT Assessment Patient needs continued PT services  PT Problem List Decreased strength;Decreased balance;Decreased mobility;Decreased coordination          PT Treatment Interventions DME instruction;Gait training;Stair training;Functional mobility training;Therapeutic activities;Therapeutic exercise;Balance training;Neuromuscular re-education;Patient/family education    PT Goals (Current goals can be found in  the Care Plan section)  Acute Rehab PT Goals Patient Stated Goal: return home  PT Goal Formulation: With patient Time For Goal Achievement: 11/17/16 Potential to Achieve Goals: Good    Frequency Min 3X/week   Barriers to discharge        Co-evaluation               End of Session Equipment Utilized During Treatment: Gait belt Activity Tolerance: Patient tolerated treatment well Patient left: in chair;with call bell/phone within reach;with chair alarm set Nurse Communication: Mobility status         Time: 1420-1443 PT Time Calculation (min) (ACUTE ONLY): 23 min   Charges:   PT Evaluation $PT Eval Moderate Complexity: 1 Procedure PT Treatments $Gait Training: 8-22 mins   PT G CodesClearnce Sorrel Cecille Mcclusky 11/03/2016, 4:30 PM Sherie Don, Isabela, DPT [phone]

## 2016-11-04 LAB — GLUCOSE, CAPILLARY
GLUCOSE-CAPILLARY: 46 mg/dL — AB (ref 65–99)
GLUCOSE-CAPILLARY: 94 mg/dL (ref 65–99)
Glucose-Capillary: 163 mg/dL — ABNORMAL HIGH (ref 65–99)
Glucose-Capillary: 153 mg/dL — ABNORMAL HIGH (ref 65–99)

## 2016-11-04 LAB — CULTURE, BLOOD (ROUTINE X 2)
CULTURE: NO GROWTH
CULTURE: NO GROWTH

## 2016-11-04 MED ORDER — INSULIN ASPART PROT & ASPART (70-30 MIX) 100 UNIT/ML ~~LOC~~ SUSP
30.0000 [IU] | Freq: Every day | SUBCUTANEOUS | Status: DC
  Administered 2016-11-04: 30 [IU] via SUBCUTANEOUS

## 2016-11-04 MED ORDER — INSULIN ASPART PROT & ASPART (70-30 MIX) 100 UNIT/ML ~~LOC~~ SUSP: 40.0000 [IU] | Freq: Every day | SUBCUTANEOUS | Status: DC

## 2016-11-04 MED ORDER — LEVOFLOXACIN 750 MG PO TABS: 750.0000 mg | ORAL_TABLET | Freq: Every day | ORAL | 0 refills | Status: DC

## 2016-11-04 MED ORDER — INSULIN ASPART PROT & ASPART (70-30 MIX) 100 UNIT/ML ~~LOC~~ SUSP: 30.0000 [IU] | Freq: Every day | SUBCUTANEOUS | Status: DC

## 2016-11-04 MED ORDER — GLUCOSE 40 % PO GEL
ORAL | Status: AC
  Administered 2016-11-04: 37.5 g
  Filled 2016-11-04: qty 1

## 2016-11-04 NOTE — Discharge Summary (Signed)
Physician Discharge Summary  Julian Ross CVE:[id number] DOB: [birth year] DOA: 10/30/2016  PCP: Kandice Hams, MD  Admit date: 10/30/2016 Discharge date: 11/04/2016  Admitted From: Home Disposition: Home   Recommendations for Outpatient Follow-up:  1. Follow up with PCP in 1 week to monitor respiratory status 2. Follow up with cardiothoracic surgery to discuss timing of CABG 3. Please obtain BMP to monitor kidney function 4. Monitor blood pressure: steroid-induced HTN suspected, asymptomatic.  5. Please follow up on the following pending results: sputum culture and gram stain.  Home Health: None recommended Equipment/Devices: None recommended  Discharge Condition: Stable CODE STATUS: DNR Diet recommendation: Heart healthy  Brief/Interim Summary: Julian Ross a 70 y.o.malewith medical history significant for rheumatoid arthritis on methotrexate, prednisone and leucovorin;sleep apnea on CPAP, hypertension, chronic combined heart failure, AFib on eliquis, CKD with associated anemia, dyslipidemia, and recently diagnosed multivessel CAD. He presented with 3 days of shortness of breath with increasing fatigue and dyspnea on exertion with cough of productive rusty brown sputum. His wife has noticed altered mentation and decreased urinary output. CXR showed patchy left upper lobe and right infrahilar opacity suspicious for pneumonia, Cr 1.7, poc troponin 0.21, initial lactic acid 2.59,WBCs 14,900 with neutrophils 84% and absolute neutrophils 12.5%. Blood cultures obtained, levaquin and nebs given and he was admitted for treatment of sepsis due to HCAP. Levaquin was replaced by vanc/aztreonam, and IVF's were provided. On the morning following admission he was given multiple benzodiazepines which caused drowsiness prompting transfer to SDU. ABG at that time was normal and no BiPAP was required. Mental status and overall clinical picture has improved and he was transferred back to the floor 11/3.  IV vanc/aztreonam were transitioned to levaquin 11/4. Supplemental oxygen was weaned off on 11/3 and procalcitonin trended downward. He has continued to have a cough but has ambulated, eating well, tolerating oral antibiotic, and remains without signs of SIRS at discharge. Due to his tenuous cardiorespiratory status, it is recommended that he follow up with his primary physician, Dr. Delfina Redwood within the next few days.   Discharge Diagnoses:  Principal Problem:   HCAP (healthcare-associated pneumonia) Active Problems:   Rheumatoid arthritis of multiple sites with negative rheumatoid factor ([place])   Coronary artery disease of native artery of native heart with stable angina pectoris (HCC)   Paroxysmal atrial fibrillation (HCC)   Chronic combined systolic and diastolic CHF (congestive heart failure) (HCC)   CKD (chronic kidney disease) stage 3, GFR 30-59 ml/min   Anemia of chronic kidney failure   Benign essential HTN   Dyslipidemia associated with type 2 diabetes mellitus (HCC)   Acute hyponatremia   OSA on CPAP   Sepsis due to pneumonia ([place])   Diabetes mellitus type 2, insulin dependent ([place])   Acute respiratory failure with hypoxia (HCC)  Sepsis and acute respiratory failure due to HCAP: Improving infectious indices. Saturating well on 2L oxygen. Bilateral pneumonia on chest x-ray in immunosuppressed pt s/p LHC 10/4. History of ARDS in the past so monitor closely for worsening condition. PCT improving, down to 0.27 11/4. CXR 11/4 with continued left-side diffuse opacity with improving infiltrates.  - Vanc/aztreonam 10/31 >> levaquin 11/4 >> 11/9 - Blood cultures negative, urine culture negative. Sputum culture and gram stain sent but not resulted. Urine Ag's neg. - Nebs prn - Off oxygen as of 11/3 - Incentive spirometry - Ambulate - PT: Recommends no PT follow up or DME  Acute encephalopathy: Pt with diminished arousal and labored breathing 11/1 AM, transferred to SDU for possible  initiation of NIPPV fearing hypercarbia. ABG reassuring, and pt has improved significantly. Likely due to sedating medications. Resolved. - Hold sedating medications  Seronegative rheumatoid arthritis of multiple sites: - Given stress-dose steroids with rapid taper,now back to preadmission prednisone. - Receives weekly methotrexate with leucovorin - not reordered during admission  Multivessel native CAD with stable angina pectoris: Demand ischemia with mild troponin elevation in setting of sepsis but no active ACS. CABG planned in near future.  - Continue preadmission carvedilol, TriCor, omega-3 fatty acids, zetiaand Ranexa - Flat troponin trend reassuring. Low threshold for cardiology consult if chest pain develops. - TCTS, Dr. [name], visited. CABG on indefinite hold pending recovery from hospitalization.  OSA on CPAP - Continue nocturnal CPAP  IDDM: March HbA1c 9.2%, CBGs significantly elevated on reduced insulin doses started at admission, taking reliable po.  - Stress-dose steroids were initially given with significant hyperglycemia without HHNK or DKA, this was tapered quickly down to home prednisone. Patient is advised to take decreased doses of insulin due to mild hypoglycemic event in this setting: 40-30-40 (breakfast, lunch, supper) down 10 units each until follow up.  Paroxysmal atrial fibrillation: Rate-controlled. CHADsVASc 5. - Continue coreg and eliquis  Chronic combined systolic and diastolic CHF (congestive heart failure): July 2017 EF 50-55%, inferior akinesis and G2DD. Low suspicion for decompensation. Suspect IVF resuscitation for sepsis may have put some fluid on the lungs.  - Will discharge with BID dosing of home lasix 20mg  x3 days and return to once daily dosing and follow up with PCP.  Benign essential HTN -As above  Acute hyponatremia: Mild. Resolved -Suspect pseudohyponatremia and setting of elevated glucose as well as a degree of hyponatremia from  dehydration  Stage III CKD Renal function stable. Recent baseline is 1.6-1.7, creatinine trending down throughout admission, last 1.34.   BPH -Continued Flomax  Anemia of chronic kidney failure: Hemoglobin stable and at baseline  Dyslipidemia associated with type 2 diabetes mellitus - Statin allergy noted - Continue preadmission omega-3 fatty acids, zetiaand TriCor  Discharge Instructions Discharge Instructions    (HEART FAILURE PATIENTS) Call MD:  Anytime you have any of the following symptoms: 1) 3 pound weight gain in 24 hours or 5 pounds in 1 week 2) shortness of breath, with or without a dry hacking cough 3) swelling in the hands, feet or stomach 4) if you have to sleep on extra pillows at night in order to breathe.    Complete by:  As directed    Call MD for:  difficulty breathing, headache or visual disturbances    Complete by:  As directed    Call MD for:  extreme fatigue    Complete by:  As directed    Call MD for:  persistant dizziness or light-headedness    Complete by:  As directed    Call MD for:  persistant nausea and vomiting    Complete by:  As directed    Call MD for:  temperature >100.4    Complete by:  As directed    Diet - low sodium heart healthy    Complete by:  As directed    Discharge instructions    Complete by:  As directed    You were admitted for pneumonia and treated for bacterial pathogens found in a healthcare setting. You've improved, no longer requiring supplemental oxygen and labs have improved. You are prepared for discharge with the following recommendations:  - CONTINUE antibiotics by completing the full course of LEVAQUIN daily.  - Restart all  home medications including prednisone.  - Until you follow up with Dr. Delfina Redwood, please decrease your insulin to 40 units with breakfast, 30 units with lunch and 40 units with dinner. Continue checking blood sugars three times daily. If excessively high or low readings, contact your PCP right away.  -  If you notice WORSENING breathing problems or chest pain seek immediate medical attention.   Increase activity slowly    Complete by:  As directed        Medication List    TAKE these medications   acetaminophen 325 MG tablet Commonly known as:  TYLENOL Take 650 mg by mouth every 4 (four) hours as needed for mild pain or headache.   ALPRAZolam 0.5 MG dissolvable tablet Commonly known as:  NIRAVAM Take 1 tablet by mouth as needed (Take as directed).   apixaban 5 MG Tabs tablet Commonly known as:  ELIQUIS Take 1 tablet (5 mg total) by mouth 2 (two) times daily.   carvedilol 3.125 MG tablet Commonly known as:  COREG Take 3.125 mg by mouth 2 (two) times daily with a meal.   fenofibrate 145 MG tablet Commonly known as:  TRICOR Take 145 mg by mouth daily.   folic acid 1 MG tablet Commonly known as:  FOLVITE Take 1 mg by mouth daily.   furosemide 20 MG tablet Commonly known as:  LASIX Take 20 mg by mouth daily.   gabapentin 300 MG capsule Commonly known as:  NEURONTIN Take 300 mg by mouth at bedtime.   hydrocortisone 2.5 % rectal cream Commonly known as:  ANUSOL-HC Place 1 application rectally 2 (two) times daily as needed for hemorrhoids or itching.   insulin lispro protamine-lispro (50-50) 100 UNIT/ML Susp injection Commonly known as:  HUMALOG 50/50 MIX Inject 40-50 Units into the skin See admin instructions. Takes 50 units in am, 40 units midday, 50 units in pm   leucovorin 10 MG tablet Commonly known as:  WELLCOVORIN Take 10 mg by mouth See admin instructions. Take 1 tablet (10 mg) by mouth every 12 hours and 24 hours after the methotrexate dose   levofloxacin 750 MG tablet Commonly known as:  LEVAQUIN Take 1 tablet (750 mg total) by mouth daily.   LUMIGAN 0.01 % Soln Generic drug:  bimatoprost Place 1 drop into both eyes at bedtime.   magnesium oxide 400 (241.3 Mg) MG tablet Commonly known as:  MAG-OX Take 1 tablet (400 mg total) by mouth 2 (two) times  daily. What changed:  when to take this   methotrexate (PF) 50 MG/2ML injection Inject 15 mg into the muscle every Friday.   omega-3 acid ethyl esters 1 g capsule Commonly known as:  LOVAZA Take 1 capsule by mouth 2 (two) times daily.   omeprazole 20 MG capsule Commonly known as:  PRILOSEC Take 20 mg by mouth daily as needed (heartburn or acid reflux).   OSTEO BI-FLEX ADV DOUBLE ST PO Take 1 tablet by mouth daily as needed (takes occassionally).   PARoxetine 40 MG tablet Commonly known as:  PAXIL Take 40 mg by mouth daily.   potassium chloride SA 20 MEQ tablet Commonly known as:  K-DUR,KLOR-CON Take 20 mEq by mouth daily.   predniSONE 5 MG tablet Commonly known as:  DELTASONE Take 5 mg by mouth daily with breakfast.   ranolazine 500 MG 12 hr tablet Commonly known as:  RANEXA Take 1 tablet (500 mg total) by mouth 2 (two) times daily.   tamsulosin 0.4 MG Caps capsule Commonly known as:  FLOMAX Take 0.4 mg by mouth daily.   tiZANidine 4 MG tablet Commonly known as:  ZANAFLEX Take 1 tablet by mouth every 8 (eight) hours as needed for muscle spasms.   topiramate 25 MG tablet Commonly known as:  TOPAMAX Take 25 mg by mouth 2 (two) times daily.   traMADol 50 MG tablet Commonly known as:  ULTRAM Take 1 tablet by mouth 3 (three) times daily as needed (pain). Take as directed   VITAMIN D (ERGOCALCIFEROL) PO Take 5,000 Units by mouth daily.   ZETIA 10 MG tablet Generic drug:  ezetimibe Take 10 mg by mouth daily.      Follow-up Information    POLITE,RONALD D, MD. Schedule an appointment as soon as possible for a visit in 1 week(s).   Specialty:  Internal Medicine Contact information: [address] [place] [address] [phone]          Allergies  Allergen Reactions  . Statins Other (See Comments)    Causes stiffness in joints   . Ace Inhibitors Cough  . Atenolol Other (See Comments)    Unknown reaction  . Contrast Media [Iodinated  Diagnostic Agents] Rash    Has to take benadryl prior to use  . Penicillins Hives and Rash    Has patient had a PCN reaction causing immediate rash, facial/tongue/throat swelling, SOB or lightheadedness with hypotension: Yes Has patient had a PCN reaction causing severe rash involving mucus membranes or skin necrosis: No Has patient had a PCN reaction that required hospitalization pt was in the hospital at time of last reaction - heart attack Has patient had a PCN reaction occurring within the last 10 years: No If all of the above answers are "NO", then may proceed with Cephalosporin use.    Consultations: None  Procedures/Studies: Dg Chest 2 View  Result Date: 11/03/2016 CLINICAL DATA:  HCAP (healthcare-associated pneumonia); SOB EXAM: CHEST  2 VIEW COMPARISON:  11/01/2016 FINDINGS: Hazy airspace opacity in the left lung primarily in the left upper lobe, has subtly improved from the prior exam. Right central upper lobe airspace opacity is unchanged. There are no new lung abnormalities. No convincing pleural effusion.  No pneumothorax. IMPRESSION: 1. Slight improvement in the pneumonia in the left upper lobe since the prior exam. No other change. Electronically Signed   By: Lajean Manes M.D.   On: 11/03/2016 08:15   Dg Chest 2 View  Result Date: 11/01/2016 CLINICAL DATA:  Followup of healthcare associated pneumonia, respiratory failure, symptomatically improved, history CHF, hypertension, diabetes mellitus, atrial fibrillation, renal disease EXAM: CHEST  2 VIEW COMPARISON:  10/31/2016 FINDINGS: Enlargement of cardiac silhouette. Mediastinal contours normal. Persistent LEFT upper lobe infiltrate consistent with pneumonia. Mild RIGHT upper lobe infiltrate persists with improved aeration at both lung bases since previous exam. No pleural effusion or pneumothorax. Bones demineralized. IMPRESSION: Persistent BILATERAL upper lobe infiltrates LEFT greater than RIGHT consistent with pneumonia. Improved  bibasilar aeration. Electronically Signed   By: Lavonia Dana M.D.   On: 11/01/2016 08:17   Dg Chest Port 1 View  Result Date: 10/31/2016 CLINICAL DATA:  Respiratory distress, pneumonia, CHF, diabetes EXAM: PORTABLE CHEST 1 VIEW COMPARISON:  10/30/2016 FINDINGS: Worsening patchy and consolidative airspace process throughout the left lung and the right hilum compatible with bilateral pneumonia. Low lung volumes persist. No developing effusion or pneumothorax. Trachea is midline. Heart is enlarged. Aortic atherosclerosis noted. IMPRESSION: Worsening diffuse left lung and right hilar airspace process compatible with pneumonia Electronically Signed   By: Jerilynn Mages.  Shick M.D.   On: 10/31/2016 09:35   Dg Chest Port 1 View  Result Date: 10/30/2016 CLINICAL DATA:  Shortness of breath, wheezing x4 days EXAM: PORTABLE CHEST 1 VIEW COMPARISON:  07/19/2016 FINDINGS: Patchy left upper lobe and right infrahilar opacity, suspicious for pneumonia. No pleural effusion or pneumothorax. The heart is normal in size. IMPRESSION: Patchy left upper lobe and right infrahilar opacity, suspicious for pneumonia. Electronically Signed   By: Julian Hy M.D.   On: 10/30/2016 12:26     Subjective: Patient feels much better this morning, still coughing but improved dyspnea without chest pain. No fever, chills, leg swelling, orthopnea.   Discharge Exam:  Vitals:   11/03/16 2034 11/04/16 0447 11/04/16 0815 11/04/16 1125  BP:  (!) 158/98 (!) 161/110 (!) 172/94  Pulse:  94 (!) 124 (!) 103  Resp: 18 16 18  (!) 22  Temp:  98.6 F (37 C) 98.4 F (36.9 C) 99 F (37.2 C)  TempSrc:  Oral Axillary Oral  SpO2:  97% 96% 96%  Weight:  94.7 kg (208 lb 12.8 oz)    Height:       General: Pt is alert, awake, not in acute distress Cardiovascular: RRR, HR ~90 on my exam, S1/S2 +, no rubs, no gallops Respiratory: Nonlabored, faint crackles at right lung base, decreased left mid and lower zones without wheezing or rhonchi.  Abdominal:  Soft, NT, ND, bowel sounds + Extremities: no edema, no cyanosis  The results of significant diagnostics from this hospitalization (including imaging, microbiology, ancillary and laboratory) are listed below for reference.    Microbiology: Recent Results (from the past 240 hour(s))  Blood Culture (routine x 2)     Status: None   Collection Time: 10/30/16 12:21 PM  Result Value Ref Range Status   Specimen Description BLOOD RIGHT ANTECUBITAL  Final   Special Requests BOTTLES DRAWN AEROBIC AND ANAEROBIC  5CC  Final   Culture NO GROWTH 5 DAYS  Final   Report Status 11/04/2016 FINAL  Final  Blood Culture (routine x 2)     Status: None   Collection Time: 10/30/16 12:30 PM  Result Value Ref Range Status   Specimen Description BLOOD RIGHT WRIST  Final   Special Requests BOTTLES DRAWN AEROBIC AND ANAEROBIC  5CC  Final   Culture NO GROWTH 5 DAYS  Final   Report Status 11/04/2016 FINAL  Final  MRSA PCR Screening     Status: Abnormal   Collection Time: 10/30/16  3:57 PM  Result Value Ref Range Status   MRSA by PCR POSITIVE (A) NEGATIVE Final    Comment:        The GeneXpert MRSA Assay (FDA approved for NASAL specimens only), is one component of a comprehensive MRSA colonization surveillance program. It is not intended to diagnose MRSA infection nor to guide or monitor treatment for MRSA infections. RESULT CALLED TO, READ BACK BY AND VERIFIED WITH: B.SCHERER,RN AT 1848 BY L.PITT 10/30/16   Urine culture     Status: None   Collection Time: 10/30/16 10:15 PM  Result Value Ref Range Status   Specimen Description URINE, RANDOM  Final   Special Requests ADDED [id number]  Final   Culture NO GROWTH  Final   Report Status 11/02/2016 FINAL  Final     Labs: BNP (last 3 results)  Recent Labs  03/19/16 0010  BNP 092.3*   Basic Metabolic Panel:  Recent Labs Lab 10/30/16 1131 10/30/16 1139 10/31/16 0106 11/01/16 0758 11/02/16 0248 11/03/16 0434  NA  130* 129* 129* 133* 134* 137   K 4.0 4.1 3.5 3.9 3.7 4.0  CL 93* 92* 95* 99* 102 107  CO2 26  --  20* 24 25 27   GLUCOSE 225* 215* 192* 287* 156* 84  BUN 22* 25* 18 24* 29* 31*  CREATININE 1.84* 1.70* 1.42* 1.48* 1.40* 1.37*  CALCIUM 9.0  --  8.6* 9.1 8.8* 9.3  PHOS  --   --   --   --  1.9*  --    Liver Function Tests:  Recent Labs Lab 10/30/16 1131 10/31/16 0106 11/02/16 0248  AST 74* 83*  --   ALT 26 28  --   ALKPHOS 37* 41  --   BILITOT 0.8 1.4*  --   PROT 7.2 7.0  --   ALBUMIN 3.4* 3.1* 2.3*   No results for input(s): LIPASE, AMYLASE in the last 168 hours. No results for input(s): AMMONIA in the last 168 hours. CBC:  Recent Labs Lab 10/30/16 1131 10/30/16 1139 10/30/16 1542 10/31/16 0106 11/01/16 0758 11/02/16 0248  WBC 14.9*  --  16.1* 18.9* 14.2* 24.0*  NEUTROABS 12.5*  --  12.8*  --   --  21.4*  HGB 12.3* 13.3 13.3 12.6* 12.1* 11.4*  HCT 36.4* 39.0 38.8* 36.2* 35.4* 33.4*  MCV 91.9  --  91.3 89.2 88.9 89.1  PLT 212  --  214 230 232 243   Cardiac Enzymes:  Recent Labs Lab 10/30/16 1542 10/30/16 1846 10/31/16 0106  TROPONINI 0.13* 0.11* 0.12*   BNP: Invalid input(s): POCBNP CBG:  Recent Labs Lab 11/03/16 2229 11/04/16 0457 11/04/16 0550 11/04/16 0834 11/04/16 1127  GLUCAP 90 46* 94 163* 153*   D-Dimer No results for input(s): DDIMER in the last 72 hours. Hgb A1c No results for input(s): HGBA1C in the last 72 hours. Lipid Profile No results for input(s): CHOL, HDL, LDLCALC, TRIG, CHOLHDL, LDLDIRECT in the last 72 hours. Thyroid function studies No results for input(s): TSH, T4TOTAL, T3FREE, THYROIDAB in the last 72 hours.  Invalid input(s): FREET3 Anemia work up No results for input(s): VITAMINB12, FOLATE, FERRITIN, TIBC, IRON, RETICCTPCT in the last 72 hours. Urinalysis    Component Value Date/Time   COLORURINE YELLOW 10/30/2016 [place] 10/30/2016 2215   LABSPEC 1.008 10/30/2016 2215   PHURINE 6.5 10/30/2016 2215   GLUCOSEU NEGATIVE  10/30/2016 2215   HGBUR SMALL (A) 10/30/2016 2215   BILIRUBINUR NEGATIVE 10/30/2016 2215   [place] 10/30/2016 2215   PROTEINUR 100 (A) 10/30/2016 2215   NITRITE NEGATIVE 10/30/2016 2215   LEUKOCYTESUR NEGATIVE 10/30/2016 2215   Sepsis Labs Invalid input(s): PROCALCITONIN,  WBC,  LACTICIDVEN Microbiology Recent Results (from the past 240 hour(s))  Blood Culture (routine x 2)     Status: None   Collection Time: 10/30/16 12:21 PM  Result Value Ref Range Status   Specimen Description BLOOD RIGHT ANTECUBITAL  Final   Special Requests BOTTLES DRAWN AEROBIC AND ANAEROBIC  5CC  Final   Culture NO GROWTH 5 DAYS  Final   Report Status 11/04/2016 FINAL  Final  Blood Culture (routine x 2)     Status: None   Collection Time: 10/30/16 12:30 PM  Result Value Ref Range Status   Specimen Description BLOOD RIGHT WRIST  Final   Special Requests BOTTLES DRAWN AEROBIC AND ANAEROBIC  5CC  Final   Culture NO GROWTH 5 DAYS  Final   Report Status 11/04/2016 FINAL  Final  MRSA PCR Screening     Status: Abnormal  Collection Time: 10/30/16  3:57 PM  Result Value Ref Range Status   MRSA by PCR POSITIVE (A) NEGATIVE Final    Comment:        The GeneXpert MRSA Assay (FDA approved for NASAL specimens only), is one component of a comprehensive MRSA colonization surveillance program. It is not intended to diagnose MRSA infection nor to guide or monitor treatment for MRSA infections. RESULT CALLED TO, READ BACK BY AND VERIFIED WITH: B.SCHERER,RN AT 1848 BY L.PITT 10/30/16   Urine culture     Status: None   Collection Time: 10/30/16 10:15 PM  Result Value Ref Range Status   Specimen Description URINE, RANDOM  Final   Special Requests ADDED [id number]  Final   Culture NO GROWTH  Final   Report Status 11/02/2016 FINAL  Final    Time coordinating discharge: Over 30 minutes  Vance Gather, MD  [facility] 11/04/2016, 11:36 AM Pager [phone]  If 7PM-7AM, please contact  night-coverage [email or web address] Password [email or web address]

## 2016-11-04 NOTE — Progress Notes (Signed)
Patient alert and oriented, denies pain, v/s stable, iv and tele d/c. D/c instruction explain and given to the patient, all questions answered, pt. Verbalized understanding. D/c pt. Home per order.

## 2016-11-04 NOTE — Progress Notes (Signed)
Hypoglycemic Event  CBG: 46  Treatment:Sprite, glucose oral gel, graham crackers, peanut butter. Symptoms: shaky, sweaty  Follow-up CBG: Time:   CBG Result:94  Possible Reasons for Event: Unknown  Comments/MD notified:On call- dr. Lutricia Feil

## 2016-11-05 ENCOUNTER — Other Ambulatory Visit (HOSPITAL_COMMUNITY): Payer: Medicare Other

## 2016-11-05 ENCOUNTER — Ambulatory Visit (HOSPITAL_COMMUNITY): Payer: Medicare Other

## 2016-11-05 ENCOUNTER — Encounter (HOSPITAL_COMMUNITY): Payer: Medicare Other

## 2016-11-06 ENCOUNTER — Other Ambulatory Visit: Payer: Self-pay | Admitting: Family Medicine

## 2016-11-06 ENCOUNTER — Other Ambulatory Visit: Payer: Self-pay | Admitting: Cardiovascular Disease

## 2016-11-06 MED ORDER — LEVOFLOXACIN 750 MG PO TABS: 750.0000 mg | ORAL_TABLET | Freq: Every day | ORAL | 0 refills | Status: DC

## 2016-11-06 MED ORDER — CLOTRIMAZOLE 1 % EX CREA: 1.0000 "application " | TOPICAL_CREAM | Freq: Two times a day (BID) | CUTANEOUS | 0 refills | Status: DC

## 2016-11-06 MED ORDER — FLUCONAZOLE 100 MG PO TABS: 100.0000 mg | ORAL_TABLET | Freq: Every day | ORAL | 0 refills | Status: AC

## 2016-11-06 NOTE — Progress Notes (Signed)
Called by wife, Julian Ross with concerns for antibiotic prescribed at discharge and home health orderes, also mentions a rash.   - No HH recommended at discharge, but he has failed to be very mobile and wife says he is more motivated with PT, that's why they did not believe he needed physical therapy. He also has decreased mobility requiring the use of family for ADLs. I have ordered home health RN, PT, and aide.  - Levaquin prescription printed at discharge per pt request and this must not have made it home with them because she can't find it. I have called this in to their [facility] pharmacy.  - He has developed a beefy red rash on the buttocks/perineal area. She sends me a photo which shows classic beefy red candidal rash with satellite papules. Due to severity and ongoing abx use in this diabetic on chronic prednisone, I have called in both topical and oral antifungal agents  - She is urged to schedule a follow up appointment with his PCP, Dr. Delfina Redwood as soon as possible. She assures me he is still breathing fine without chest pain or evidence of distress.  Vance Gather, MD 11/06/2016 11:56 AM

## 2016-11-06 NOTE — Progress Notes (Signed)
Received message from spouse that patient needs [place] services. TCT spouse, she requested [place]. TCT Dr Seward Carol PCP, message left for orders for [facility] RN / PT/ aide. (pt is discharged and Epic will not allow any orders to be entered at this time); Also Stanton Kidney with [facility] called and updated. Mindi Slicker [facility] [phone]

## 2016-11-08 DIAGNOSIS — J189 Pneumonia, unspecified organism: Secondary | ICD-10-CM | POA: Diagnosis not present

## 2016-11-08 DIAGNOSIS — I25119 Atherosclerotic heart disease of native coronary artery with unspecified angina pectoris: Secondary | ICD-10-CM | POA: Diagnosis not present

## 2016-11-08 DIAGNOSIS — I110 Hypertensive heart disease with heart failure: Secondary | ICD-10-CM | POA: Diagnosis not present

## 2016-11-08 DIAGNOSIS — E1122 Type 2 diabetes mellitus with diabetic chronic kidney disease: Secondary | ICD-10-CM | POA: Diagnosis not present

## 2016-11-08 DIAGNOSIS — I5042 Chronic combined systolic (congestive) and diastolic (congestive) heart failure: Secondary | ICD-10-CM | POA: Diagnosis not present

## 2016-11-08 DIAGNOSIS — I481 Persistent atrial fibrillation: Secondary | ICD-10-CM | POA: Diagnosis not present

## 2016-11-12 ENCOUNTER — Ambulatory Visit: Payer: Medicare Other | Admitting: Thoracic Surgery (Cardiothoracic Vascular Surgery)

## 2016-11-12 DIAGNOSIS — J189 Pneumonia, unspecified organism: Secondary | ICD-10-CM | POA: Diagnosis not present

## 2016-11-12 DIAGNOSIS — E1122 Type 2 diabetes mellitus with diabetic chronic kidney disease: Secondary | ICD-10-CM | POA: Diagnosis not present

## 2016-11-12 DIAGNOSIS — I5042 Chronic combined systolic (congestive) and diastolic (congestive) heart failure: Secondary | ICD-10-CM | POA: Diagnosis not present

## 2016-11-12 DIAGNOSIS — I110 Hypertensive heart disease with heart failure: Secondary | ICD-10-CM | POA: Diagnosis not present

## 2016-11-12 DIAGNOSIS — I481 Persistent atrial fibrillation: Secondary | ICD-10-CM | POA: Diagnosis not present

## 2016-11-12 DIAGNOSIS — I25119 Atherosclerotic heart disease of native coronary artery with unspecified angina pectoris: Secondary | ICD-10-CM | POA: Diagnosis not present

## 2016-11-14 ENCOUNTER — Encounter (HOSPITAL_COMMUNITY): Payer: Medicare Other

## 2016-11-14 ENCOUNTER — Other Ambulatory Visit (HOSPITAL_COMMUNITY): Payer: Medicare Other

## 2016-11-14 DIAGNOSIS — I5042 Chronic combined systolic (congestive) and diastolic (congestive) heart failure: Secondary | ICD-10-CM | POA: Diagnosis not present

## 2016-11-14 DIAGNOSIS — J189 Pneumonia, unspecified organism: Secondary | ICD-10-CM | POA: Diagnosis not present

## 2016-11-14 DIAGNOSIS — E1122 Type 2 diabetes mellitus with diabetic chronic kidney disease: Secondary | ICD-10-CM | POA: Diagnosis not present

## 2016-11-14 DIAGNOSIS — I481 Persistent atrial fibrillation: Secondary | ICD-10-CM | POA: Diagnosis not present

## 2016-11-14 DIAGNOSIS — I110 Hypertensive heart disease with heart failure: Secondary | ICD-10-CM | POA: Diagnosis not present

## 2016-11-14 DIAGNOSIS — I25119 Atherosclerotic heart disease of native coronary artery with unspecified angina pectoris: Secondary | ICD-10-CM | POA: Diagnosis not present

## 2016-11-15 DIAGNOSIS — E1122 Type 2 diabetes mellitus with diabetic chronic kidney disease: Secondary | ICD-10-CM | POA: Diagnosis not present

## 2016-11-15 DIAGNOSIS — I5042 Chronic combined systolic (congestive) and diastolic (congestive) heart failure: Secondary | ICD-10-CM | POA: Diagnosis not present

## 2016-11-15 DIAGNOSIS — I481 Persistent atrial fibrillation: Secondary | ICD-10-CM | POA: Diagnosis not present

## 2016-11-15 DIAGNOSIS — J189 Pneumonia, unspecified organism: Secondary | ICD-10-CM | POA: Diagnosis not present

## 2016-11-15 DIAGNOSIS — I110 Hypertensive heart disease with heart failure: Secondary | ICD-10-CM | POA: Diagnosis not present

## 2016-11-15 DIAGNOSIS — I25119 Atherosclerotic heart disease of native coronary artery with unspecified angina pectoris: Secondary | ICD-10-CM | POA: Diagnosis not present

## 2016-11-16 ENCOUNTER — Inpatient Hospital Stay: Admit: 2016-11-16 | Payer: Medicare Other | Admitting: Thoracic Surgery (Cardiothoracic Vascular Surgery)

## 2016-11-16 ENCOUNTER — Other Ambulatory Visit: Payer: Self-pay | Admitting: Internal Medicine

## 2016-11-16 ENCOUNTER — Ambulatory Visit
Admission: RE | Admit: 2016-11-16 | Discharge: 2016-11-16 | Disposition: A | Discharge status: Home / Self Care | Payer: Medicare Other | Source: Ambulatory Visit | Attending: Internal Medicine | Admitting: Internal Medicine

## 2016-11-16 DIAGNOSIS — J189 Pneumonia, unspecified organism: Secondary | ICD-10-CM

## 2016-11-16 DIAGNOSIS — M059 Rheumatoid arthritis with rheumatoid factor, unspecified: Secondary | ICD-10-CM | POA: Diagnosis not present

## 2016-11-16 DIAGNOSIS — I5022 Chronic systolic (congestive) heart failure: Secondary | ICD-10-CM | POA: Diagnosis not present

## 2016-11-16 DIAGNOSIS — R0602 Shortness of breath: Secondary | ICD-10-CM | POA: Diagnosis not present

## 2016-11-16 DIAGNOSIS — I4891 Unspecified atrial fibrillation: Secondary | ICD-10-CM | POA: Diagnosis not present

## 2016-11-16 DIAGNOSIS — F419 Anxiety disorder, unspecified: Secondary | ICD-10-CM | POA: Diagnosis not present

## 2016-11-16 SURGERY — CORONARY ARTERY BYPASS GRAFTING (CABG)
Anesthesia: General | Site: Chest

## 2016-11-19 DIAGNOSIS — I25119 Atherosclerotic heart disease of native coronary artery with unspecified angina pectoris: Secondary | ICD-10-CM | POA: Diagnosis not present

## 2016-11-19 DIAGNOSIS — I110 Hypertensive heart disease with heart failure: Secondary | ICD-10-CM | POA: Diagnosis not present

## 2016-11-19 DIAGNOSIS — I5042 Chronic combined systolic (congestive) and diastolic (congestive) heart failure: Secondary | ICD-10-CM | POA: Diagnosis not present

## 2016-11-19 DIAGNOSIS — J189 Pneumonia, unspecified organism: Secondary | ICD-10-CM | POA: Diagnosis not present

## 2016-11-19 DIAGNOSIS — I481 Persistent atrial fibrillation: Secondary | ICD-10-CM | POA: Diagnosis not present

## 2016-11-19 DIAGNOSIS — E1122 Type 2 diabetes mellitus with diabetic chronic kidney disease: Secondary | ICD-10-CM | POA: Diagnosis not present

## 2016-11-26 DIAGNOSIS — I481 Persistent atrial fibrillation: Secondary | ICD-10-CM | POA: Diagnosis not present

## 2016-11-26 DIAGNOSIS — E1122 Type 2 diabetes mellitus with diabetic chronic kidney disease: Secondary | ICD-10-CM | POA: Diagnosis not present

## 2016-11-26 DIAGNOSIS — I25119 Atherosclerotic heart disease of native coronary artery with unspecified angina pectoris: Secondary | ICD-10-CM | POA: Diagnosis not present

## 2016-11-26 DIAGNOSIS — J189 Pneumonia, unspecified organism: Secondary | ICD-10-CM | POA: Diagnosis not present

## 2016-11-26 DIAGNOSIS — I110 Hypertensive heart disease with heart failure: Secondary | ICD-10-CM | POA: Diagnosis not present

## 2016-11-26 DIAGNOSIS — I5042 Chronic combined systolic (congestive) and diastolic (congestive) heart failure: Secondary | ICD-10-CM | POA: Diagnosis not present

## 2016-11-27 DIAGNOSIS — I110 Hypertensive heart disease with heart failure: Secondary | ICD-10-CM | POA: Diagnosis not present

## 2016-11-27 DIAGNOSIS — E1122 Type 2 diabetes mellitus with diabetic chronic kidney disease: Secondary | ICD-10-CM | POA: Diagnosis not present

## 2016-11-27 DIAGNOSIS — I25119 Atherosclerotic heart disease of native coronary artery with unspecified angina pectoris: Secondary | ICD-10-CM | POA: Diagnosis not present

## 2016-11-27 DIAGNOSIS — I5042 Chronic combined systolic (congestive) and diastolic (congestive) heart failure: Secondary | ICD-10-CM | POA: Diagnosis not present

## 2016-11-27 DIAGNOSIS — I481 Persistent atrial fibrillation: Secondary | ICD-10-CM | POA: Diagnosis not present

## 2016-11-27 DIAGNOSIS — J189 Pneumonia, unspecified organism: Secondary | ICD-10-CM | POA: Diagnosis not present

## 2016-11-29 DIAGNOSIS — I25119 Atherosclerotic heart disease of native coronary artery with unspecified angina pectoris: Secondary | ICD-10-CM | POA: Diagnosis not present

## 2016-11-29 DIAGNOSIS — J189 Pneumonia, unspecified organism: Secondary | ICD-10-CM | POA: Diagnosis not present

## 2016-11-29 DIAGNOSIS — I481 Persistent atrial fibrillation: Secondary | ICD-10-CM | POA: Diagnosis not present

## 2016-11-29 DIAGNOSIS — I110 Hypertensive heart disease with heart failure: Secondary | ICD-10-CM | POA: Diagnosis not present

## 2016-11-29 DIAGNOSIS — E1122 Type 2 diabetes mellitus with diabetic chronic kidney disease: Secondary | ICD-10-CM | POA: Diagnosis not present

## 2016-11-29 DIAGNOSIS — I5042 Chronic combined systolic (congestive) and diastolic (congestive) heart failure: Secondary | ICD-10-CM | POA: Diagnosis not present

## 2016-11-30 ENCOUNTER — Other Ambulatory Visit: Payer: Self-pay | Admitting: Thoracic Surgery (Cardiothoracic Vascular Surgery)

## 2016-11-30 DIAGNOSIS — Z951 Presence of aortocoronary bypass graft: Secondary | ICD-10-CM

## 2016-12-03 ENCOUNTER — Ambulatory Visit
Admission: RE | Admit: 2016-12-03 | Discharge: 2016-12-03 | Disposition: A | Discharge status: Home / Self Care | Payer: Medicare Other | Source: Ambulatory Visit | Attending: Thoracic Surgery (Cardiothoracic Vascular Surgery) | Admitting: Thoracic Surgery (Cardiothoracic Vascular Surgery)

## 2016-12-03 ENCOUNTER — Other Ambulatory Visit: Payer: Self-pay | Admitting: Thoracic Surgery (Cardiothoracic Vascular Surgery)

## 2016-12-03 ENCOUNTER — Ambulatory Visit (INDEPENDENT_AMBULATORY_CARE_PROVIDER_SITE_OTHER): Payer: Medicare Other | Admitting: Thoracic Surgery (Cardiothoracic Vascular Surgery)

## 2016-12-03 ENCOUNTER — Encounter: Payer: Self-pay | Admitting: Thoracic Surgery (Cardiothoracic Vascular Surgery)

## 2016-12-03 VITALS — BP 161/96 | Resp 16 | Ht 71.0 in | Wt 202.0 lb

## 2016-12-03 DIAGNOSIS — J189 Pneumonia, unspecified organism: Secondary | ICD-10-CM

## 2016-12-03 DIAGNOSIS — I4819 Other persistent atrial fibrillation: Secondary | ICD-10-CM

## 2016-12-03 DIAGNOSIS — I5042 Chronic combined systolic (congestive) and diastolic (congestive) heart failure: Secondary | ICD-10-CM | POA: Diagnosis not present

## 2016-12-03 DIAGNOSIS — I209 Angina pectoris, unspecified: Secondary | ICD-10-CM

## 2016-12-03 DIAGNOSIS — I255 Ischemic cardiomyopathy: Secondary | ICD-10-CM | POA: Diagnosis not present

## 2016-12-03 DIAGNOSIS — Z951 Presence of aortocoronary bypass graft: Secondary | ICD-10-CM

## 2016-12-03 DIAGNOSIS — R0602 Shortness of breath: Secondary | ICD-10-CM | POA: Diagnosis not present

## 2016-12-03 DIAGNOSIS — I481 Persistent atrial fibrillation: Secondary | ICD-10-CM | POA: Diagnosis not present

## 2016-12-03 DIAGNOSIS — I25118 Atherosclerotic heart disease of native coronary artery with other forms of angina pectoris: Secondary | ICD-10-CM

## 2016-12-03 NOTE — Patient Instructions (Signed)
Continue all previous medications without any changes at this time  

## 2016-12-03 NOTE — Progress Notes (Signed)
[address]       [place] [address]             [phone]     CARDIOTHORACIC SURGERY OFFICE NOTE  Referring Provider is Skeet Latch, MD PCP is Kandice Hams, MD   HPI:  Patient returns to the office today for follow-up of three-vessel coronary artery disease with ischemic cardiomyopathy, chronic combined systolic and diastolic congestive heart failure, and recurrent paroxysmal atrial fibrillation. He was initially seen in consultation on 10/19/2016. Just prior to that he was started on Ranexa for symptoms of exertional chest pain consistent with angina pectoris. The potential risks and benefits associated with elective coronary artery bypass grafting with or without Maze procedure was discussed at that time. The increased risks associated with surgery because of the patient's numerous comorbid medical problems and somewhat limited physical mobility was discussed at length with the patient and his family. He was sent for a formal physical therapy consultation and last seen in our office on 10/29/2016 at which time he was doing poorly with progressive nonproductive cough, sinus congestion, and upper respiratory tract infection.  Over the next few days he got worse and he ultimately was hospitalized for acute pneumonia with leukocytosis and chest x-ray demonstrating left upper lobe infiltrate.  He was treated medically and improved, and he returns to our office today for follow-up. He has not yet been seen in follow-up by Dr. [name] nor his primary care physician since hospital discharge.  He presents to our office today alone without any other family members present. He states that since hospital discharge she has continued to make slow but steady improvement. He denies any fevers, chills, or productive cough. His exercise tolerance has been slowly improving. Appetite is been improving. He has not had any chest pain or chest tightness either with activity or at  rest.   Current Outpatient Prescriptions  Medication Sig Dispense Refill  . acetaminophen (TYLENOL) 325 MG tablet Take 650 mg by mouth every 4 (four) hours as needed for mild pain or headache.    . ALPRAZolam (NIRAVAM) 0.5 MG dissolvable tablet Take 1 tablet by mouth as needed (Take as directed).   2  . apixaban (ELIQUIS) 5 MG TABS tablet Take 1 tablet (5 mg total) by mouth 2 (two) times daily. 60 tablet   . carvedilol (COREG) 3.125 MG tablet Take 3.125 mg by mouth 2 (two) times daily with a meal.    . clotrimazole (LOTRIMIN) 1 % cream Apply 1 application topically 2 (two) times daily. 30 g 0  . fenofibrate (TRICOR) 145 MG tablet Take 145 mg by mouth daily.    . folic acid (FOLVITE) 1 MG tablet Take 1 mg by mouth daily.    Marland Kitchen gabapentin (NEURONTIN) 300 MG capsule Take 300 mg by mouth at bedtime.   2  . hydrocortisone (ANUSOL-HC) 2.5 % rectal cream Place 1 application rectally 2 (two) times daily as needed for hemorrhoids or itching. 30 g 0  . insulin lispro protamine-lispro (HUMALOG 50/50 MIX) (50-50) 100 UNIT/ML SUSP injection Inject 40-50 Units into the skin See admin instructions. Takes 50 units in am, 40 units midday, 50 units in pm    . leucovorin (WELLCOVORIN) 10 MG tablet Take 10 mg by mouth See admin instructions. Take 1 tablet (10 mg) by mouth every 12 hours and 24 hours after the methotrexate dose    . levofloxacin (LEVAQUIN) 750 MG tablet Take 1 tablet (750 mg total) by mouth daily.  5 tablet 0  . LUMIGAN 0.01 % SOLN Place 1 drop into both eyes at bedtime.   0  . magnesium oxide (MAG-OX) 400 (241.3 Mg) MG tablet Take 1 tablet (400 mg total) by mouth 2 (two) times daily. (Patient taking differently: Take 400 mg by mouth daily. ) 60 tablet 0  . Methotrexate Sodium (METHOTREXATE, PF,) 50 MG/2ML injection Inject 15 mg into the muscle every Friday.   0  . Misc Natural Products (OSTEO BI-FLEX ADV DOUBLE ST PO) Take 1 tablet by mouth daily as needed (takes occassionally).     Marland Kitchen omega-3 acid  ethyl esters (LOVAZA) 1 g capsule Take 1 capsule by mouth 2 (two) times daily.  11  . omeprazole (PRILOSEC) 20 MG capsule Take 20 mg by mouth daily as needed (heartburn or acid reflux).    Marland Kitchen PARoxetine (PAXIL) 40 MG tablet Take 40 mg by mouth daily.   11  . potassium chloride SA (K-DUR,KLOR-CON) 20 MEQ tablet Take 20 mEq by mouth daily.     . predniSONE (DELTASONE) 5 MG tablet Take 5 mg by mouth daily with breakfast.    . ranolazine (RANEXA) 500 MG 12 hr tablet Take 1 tablet (500 mg total) by mouth 2 (two) times daily. 180 tablet 1  . tamsulosin (FLOMAX) 0.4 MG CAPS capsule Take 0.4 mg by mouth daily.   4  . tiZANidine (ZANAFLEX) 4 MG tablet Take 1 tablet by mouth every 8 (eight) hours as needed for muscle spasms.   2  . topiramate (TOPAMAX) 25 MG tablet Take 25 mg by mouth 2 (two) times daily.    . traMADol (ULTRAM) 50 MG tablet Take 1 tablet by mouth 3 (three) times daily as needed (pain). Take as directed  2  . VITAMIN D, ERGOCALCIFEROL, PO Take 5,000 Units by mouth daily.     Marland Kitchen ZETIA 10 MG tablet Take 10 mg by mouth daily.   11  . furosemide (LASIX) 20 MG tablet Take 20 mg by mouth daily.      No current facility-administered medications for this visit.       Physical Exam:   BP (!) 161/96 (BP Location: Right Arm, Patient Position: Sitting, Cuff Size: Large)   Resp 16   Ht 5\' 11"  (1.803 m)   Wt 202 lb (91.6 kg)   SpO2 96% Comment: RA  BMI 28.17 kg/m   General:  Elderly but well-appearing  Chest:   Clear to auscultation  CV:   Regular rate and rhythm  Incisions:  n/a  Abdomen:  Soft nontender  Extremities:  Warm and well perfused, no lower extremity edema  Diagnostic Tests:  CHEST  2 VIEW  COMPARISON:  Chest x-ray 11/16/2016.  FINDINGS: The continues to be a small amount of interstitial prominence throughout the left upper lobe, but this continues to improve compared to the prior examination. No new consolidative airspace disease. No pleural effusions. No definite  suspicious appearing pulmonary nodules or masses. Heart size is normal. Upper mediastinal contours are within normal limits. Aortic atherosclerosis.  IMPRESSION: 1. Slight continued improvement in left upper lobe consolidation indicative of resolving pneumonia. Repeat standing PA and lateral chest radiograph is recommended in 3 months to ensure complete resolution of this finding. 2. Aortic atherosclerosis.   Electronically Signed   By: Vinnie Langton M.D.   On: 12/03/2016 16:01   Impression:  Patient appears to be slowly recovering from his recent hospitalization for acute pneumonia. He has not had any further episodes of substernal chest pain  or chest tightness suggestive of angina pectoris. His respiratory status appears quite stable.    Plan:  I discussed matters at length with the patient in the office today. His coronary artery disease appears to be quite stable clinically, and he does not have high-grade proximal stenosis of the left anterior descending coronary artery. Risks associated with elective coronary artery bypass grafting with or without concomitant maze procedure would be considerable in this patient with multiple chronic medical problems and long-term immunosuppression. Under the circumstances, it probably makes most sense to continue to treat him medically and follow him closely. He will need follow-up echocardiogram to reassess left ventricular function at some point in the near future. If he develops further exertional chest pain or chest tightness consistent with angina pectoris, we may have no choice but to proceed with elective surgical revascularization. However, at this point in time the patient seems to be doing reasonably well. The patient has an appointment scheduled to follow-up with Dr. [name] within the next 2 weeks. We will plan to see him back in 3 months with a follow-up chest x-ray. We will be happy to see him sooner as needed. All of his questions  have been addressed.  I spent in excess of 15 minutes during the conduct of this office consultation and >50% of this time involved direct face-to-face encounter with the patient for counseling and/or coordination of their care.    Valentina Gu. [name], MD 12/03/2016 5:15 PM

## 2016-12-04 DIAGNOSIS — I110 Hypertensive heart disease with heart failure: Secondary | ICD-10-CM | POA: Diagnosis not present

## 2016-12-04 DIAGNOSIS — M9902 Segmental and somatic dysfunction of thoracic region: Secondary | ICD-10-CM | POA: Diagnosis not present

## 2016-12-04 DIAGNOSIS — M9904 Segmental and somatic dysfunction of sacral region: Secondary | ICD-10-CM | POA: Diagnosis not present

## 2016-12-04 DIAGNOSIS — I25119 Atherosclerotic heart disease of native coronary artery with unspecified angina pectoris: Secondary | ICD-10-CM | POA: Diagnosis not present

## 2016-12-04 DIAGNOSIS — S23142A Subluxation of T7/T8 thoracic vertebra, initial encounter: Secondary | ICD-10-CM | POA: Diagnosis not present

## 2016-12-04 DIAGNOSIS — I481 Persistent atrial fibrillation: Secondary | ICD-10-CM | POA: Diagnosis not present

## 2016-12-04 DIAGNOSIS — E1122 Type 2 diabetes mellitus with diabetic chronic kidney disease: Secondary | ICD-10-CM | POA: Diagnosis not present

## 2016-12-04 DIAGNOSIS — I5042 Chronic combined systolic (congestive) and diastolic (congestive) heart failure: Secondary | ICD-10-CM | POA: Diagnosis not present

## 2016-12-04 DIAGNOSIS — J189 Pneumonia, unspecified organism: Secondary | ICD-10-CM | POA: Diagnosis not present

## 2016-12-04 DIAGNOSIS — S332XXA Dislocation of sacroiliac and sacrococcygeal joint, initial encounter: Secondary | ICD-10-CM | POA: Diagnosis not present

## 2016-12-06 DIAGNOSIS — I110 Hypertensive heart disease with heart failure: Secondary | ICD-10-CM | POA: Diagnosis not present

## 2016-12-06 DIAGNOSIS — I25119 Atherosclerotic heart disease of native coronary artery with unspecified angina pectoris: Secondary | ICD-10-CM | POA: Diagnosis not present

## 2016-12-06 DIAGNOSIS — I5042 Chronic combined systolic (congestive) and diastolic (congestive) heart failure: Secondary | ICD-10-CM | POA: Diagnosis not present

## 2016-12-06 DIAGNOSIS — J189 Pneumonia, unspecified organism: Secondary | ICD-10-CM | POA: Diagnosis not present

## 2016-12-06 DIAGNOSIS — E1122 Type 2 diabetes mellitus with diabetic chronic kidney disease: Secondary | ICD-10-CM | POA: Diagnosis not present

## 2016-12-06 DIAGNOSIS — I481 Persistent atrial fibrillation: Secondary | ICD-10-CM | POA: Diagnosis not present

## 2016-12-11 DIAGNOSIS — I25119 Atherosclerotic heart disease of native coronary artery with unspecified angina pectoris: Secondary | ICD-10-CM | POA: Diagnosis not present

## 2016-12-11 DIAGNOSIS — E1122 Type 2 diabetes mellitus with diabetic chronic kidney disease: Secondary | ICD-10-CM | POA: Diagnosis not present

## 2016-12-11 DIAGNOSIS — I110 Hypertensive heart disease with heart failure: Secondary | ICD-10-CM | POA: Diagnosis not present

## 2016-12-11 DIAGNOSIS — I5042 Chronic combined systolic (congestive) and diastolic (congestive) heart failure: Secondary | ICD-10-CM | POA: Diagnosis not present

## 2016-12-11 DIAGNOSIS — I481 Persistent atrial fibrillation: Secondary | ICD-10-CM | POA: Diagnosis not present

## 2016-12-11 DIAGNOSIS — J189 Pneumonia, unspecified organism: Secondary | ICD-10-CM | POA: Diagnosis not present

## 2016-12-12 ENCOUNTER — Encounter: Payer: Self-pay | Admitting: Cardiovascular Disease

## 2016-12-12 ENCOUNTER — Ambulatory Visit (INDEPENDENT_AMBULATORY_CARE_PROVIDER_SITE_OTHER): Payer: Medicare Other | Admitting: Cardiovascular Disease

## 2016-12-12 VITALS — BP 130/80 | HR 92 | Ht 71.0 in | Wt 211.2 lb

## 2016-12-12 DIAGNOSIS — I480 Paroxysmal atrial fibrillation: Secondary | ICD-10-CM | POA: Diagnosis not present

## 2016-12-12 DIAGNOSIS — I25118 Atherosclerotic heart disease of native coronary artery with other forms of angina pectoris: Secondary | ICD-10-CM

## 2016-12-12 DIAGNOSIS — I110 Hypertensive heart disease with heart failure: Secondary | ICD-10-CM | POA: Diagnosis not present

## 2016-12-12 DIAGNOSIS — I5042 Chronic combined systolic (congestive) and diastolic (congestive) heart failure: Secondary | ICD-10-CM

## 2016-12-12 DIAGNOSIS — I209 Angina pectoris, unspecified: Secondary | ICD-10-CM

## 2016-12-12 DIAGNOSIS — M2021 Hallux rigidus, right foot: Secondary | ICD-10-CM | POA: Diagnosis not present

## 2016-12-12 DIAGNOSIS — S92501A Displaced unspecified fracture of right lesser toe(s), initial encounter for closed fracture: Secondary | ICD-10-CM | POA: Diagnosis not present

## 2016-12-12 DIAGNOSIS — M2041 Other hammer toe(s) (acquired), right foot: Secondary | ICD-10-CM | POA: Diagnosis not present

## 2016-12-12 DIAGNOSIS — E7800 Pure hypercholesterolemia, unspecified: Secondary | ICD-10-CM

## 2016-12-12 DIAGNOSIS — I255 Ischemic cardiomyopathy: Secondary | ICD-10-CM

## 2016-12-12 NOTE — Patient Instructions (Addendum)
Medication Instructions:  Your physician recommends that you continue on your current medications as directed. Please refer to the Current Medication list given to you today.  Labwork: none  Testing/Procedures: none  Follow-Up: Your physician recommends that you schedule a follow-up appointment in: 3 month ov  You have been referred to Dr Rayann Heman for Afib. If you do not hear from the office in a couple of days, call at [phone]  You are scheduled to see Dr Doran Durand today at 2:15  If you need a refill on your cardiac medications before your next appointment, please call your pharmacy.

## 2016-12-12 NOTE — Progress Notes (Signed)
Cardiology Office Note   Date:  12/12/2016   ID:  Julian Ross, DOB [birth year], MRN [id number]  PCP:  Kandice Hams, MD  Cardiologist:   Skeet Latch, MD   No chief complaint on file.    History of Present Illness: Julian Ross is a 70 y.o. male with chronic systolic and diastolic heart failure LVEF improved from 30-35% to 50-55%, paroxysmal atrial fibrillation, CAD s/p multiple PCIs, who presents for follow up.  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  During that hospitalization he also had elevated troponin consistent with demand ischemia (troponin 0.13) and new onset atrial fibrillation with RVR.  He was diuresed to a discharge weight of 196 lb (from 208 lb on admit).  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  He previously had 16 stents placed in [place], [place].  His cardiologist there is [name].  The inpatient team contacted [name], who informed them that [name] has a tight RCA lesion but no recent interventions.  He felt that Mr. [name] could transition from Plavix to aspirin.  He has been intolerant to statins in the past.    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  After cardioversion he developed bradycardia and initially metoprolol was reduced.  Then he was switched from metoprolol to carvedilol.  This was further reduced to 6.25mg  bid on 06/19/16.  On 7/21 he had a recurrent episode of syncope. Hydralazine and isosorbide were discontinued.  Troponin was mildly elevated at 0.03.  CT of the head was negative, and chest x-ray showed no acute edema or infiltrate.  During that hospitalization he was noted to be orthostatic.  His SBP dropped to 80 when standing.  Carvedilol was reduced 1.5625 mg bid and his orthostasis was improved.  A 30 day event monitor was placed and it was recommended that outpatient Myoview be considered.  Echo that admission showed an  improvement in his LVEF to 50-55%. He wore an event monitor 07/31/16 that revealed short runs of atrial fibrillation as well as PVCs and PACs.  At his last appointment [name] reported episodes of angina. He was referred for cardiac catheterization where he was noted to have severe, diffuse, three-vessel coronary disease with multiple areas of prior stenting. A recommendation was made for intensification of medical therapy or consideration of coronary artery bypass grafting. He also was evaluated by [name] for management of his atrial fibrillation.  He was felt to be a poor candidate for ablation.  Amiodarone and dofetilide would be options, but it was unclear whether atrial fibrillation was actually causing symptoms.  [name] suggested considering CABG with a surgical MAZE and clipping of the LAA.  Since his last appointment [name] saw Dr. [name] and was scheduled to have CABG/MAZE.  However, he then developed pneumonia requiring hospitalization.  He developed demand ischemia and was managed medically.  He required hospitalization in the ICU and was discharged with home PT.  He followed up with Dr. [name] and he was doing well, so a decision was made to defer his surgery.  Since his hospitalization [name] has been doing much better.  He is slowly getting stronger.  He feels that Ranexa has helped and he denies chest pain.  He has been doing physical therapy at home and wants to start going to cardiac rehab.  He breathing has been stable and he denies lower extremity edema, orthopnea or PND.  Prior to his hospitalization he fell and hurt his leg.  He was referred to ortho but never followed up due to the hospitalization.  Past Medical History:  Diagnosis Date  . Anemia   . Anxiety state   . CAD (coronary artery disease)    16 prior stents (at [facility])  . Cardiomyopathy, ischemic   . Chronic lower back pain   . Chronic systolic CHF (congestive heart failure) ([place])   . CKD (chronic  kidney disease), stage III   . Depression   . Diabetes mellitus type 2 in obese ([place])   . Fibromyalgia   . Hepatitis A    at age 68  . High cholesterol   . History of blood transfusion    "related to OR"  . Hypertension   . Hypokalemia   . MI (myocardial infarction) 1990; 1995; 1997  . OSA on CPAP   . Osteoarthritis   . Persistent atrial fibrillation ([place]) 02/2016   occured in setting of pneumonia  . Rheumatoid arthritis of multiple sites with negative rheumatoid factor ([place])   . Septic shock ([place]) 02/2016   in setting of severe pneumonia    Past Surgical History:  Procedure Laterality Date  . BACK SURGERY    . CARDIAC CATHETERIZATION N/A 10/03/2016   Procedure: Left Heart Cath and Coronary Angiography;  Surgeon: Belva Crome, MD;  Location: [facility];  Service: Cardiovascular;  Laterality: N/A;  . CARDIOVERSION N/A 06/04/2016   Procedure: CARDIOVERSION;  Surgeon: Skeet Latch, MD;  Location: [facility];  Service: Cardiovascular;  Laterality: N/A;  . CATARACT EXTRACTION W/ INTRAOCULAR LENS  IMPLANT, BILATERAL Bilateral 2000s  . CORONARY ANGIOPLASTY  early 41s  . CORONARY ANGIOPLASTY WITH STENT PLACEMENT     "I've got 15 stents" (07/19/2016)  . KNEE ARTHROSCOPY Left 1990s  . POSTERIOR LUMBAR FUSION  2015   L4-5-S1  . right knee surgery     age of 20  . TONSILLECTOMY AND ADENOIDECTOMY  1949     Current Outpatient Prescriptions  Medication Sig Dispense Refill  . acetaminophen (TYLENOL) 325 MG tablet Take 650 mg by mouth every 4 (four) hours as needed for mild pain or headache.    . ALPRAZolam (NIRAVAM) 0.5 MG dissolvable tablet Take 1 tablet by mouth as needed (Take as directed).   2  . apixaban (ELIQUIS) 5 MG TABS tablet Take 1 tablet (5 mg total) by mouth 2 (two) times daily. 60 tablet   . carvedilol (COREG) 6.25 MG tablet Take 6.25 mg by mouth 2 (two) times daily with a meal.    . clotrimazole (LOTRIMIN) 1 % cream Apply 1 application topically 2 (two)  times daily. 30 g 0  . fenofibrate (TRICOR) 145 MG tablet Take 145 mg by mouth daily.    . folic acid (FOLVITE) 1 MG tablet Take 1 mg by mouth daily.    . furosemide (LASIX) 20 MG tablet Take 20 mg by mouth daily.     Marland Kitchen gabapentin (NEURONTIN) 300 MG capsule Take 300 mg by mouth at bedtime.   2  . hydrocortisone (ANUSOL-HC) 2.5 % rectal cream Place 1 application rectally 2 (two) times daily as needed for hemorrhoids or itching. 30 g 0  . insulin lispro protamine-lispro (HUMALOG 50/50 MIX) (50-50) 100 UNIT/ML SUSP injection Inject 40-50 Units into the skin See admin instructions. Takes 50 units in am, 40 units midday, 50 units in pm    . leucovorin (WELLCOVORIN) 10 MG tablet Take 10 mg by mouth  See admin instructions. Take 1 tablet (10 mg) by mouth every 12 hours and 24 hours after the methotrexate dose    . levofloxacin (LEVAQUIN) 750 MG tablet Take 1 tablet (750 mg total) by mouth daily. 5 tablet 0  . LUMIGAN 0.01 % SOLN Place 1 drop into both eyes at bedtime.   0  . magnesium oxide (MAG-OX) 400 (241.3 Mg) MG tablet Take 1 tablet (400 mg total) by mouth 2 (two) times daily. (Patient taking differently: Take 400 mg by mouth daily. ) 60 tablet 0  . Methotrexate Sodium (METHOTREXATE, PF,) 50 MG/2ML injection Inject 15 mg into the muscle every Friday.   0  . Misc Natural Products (OSTEO BI-FLEX ADV DOUBLE ST PO) Take 1 tablet by mouth daily as needed (takes occassionally).     Marland Kitchen omega-3 acid ethyl esters (LOVAZA) 1 g capsule Take 1 capsule by mouth 2 (two) times daily.  11  . omeprazole (PRILOSEC) 20 MG capsule Take 20 mg by mouth daily as needed (heartburn or acid reflux).    Marland Kitchen PARoxetine (PAXIL) 40 MG tablet Take 40 mg by mouth daily.   11  . potassium chloride SA (K-DUR,KLOR-CON) 20 MEQ tablet Take 20 mEq by mouth daily.     . predniSONE (DELTASONE) 5 MG tablet Take 5 mg by mouth daily with breakfast.    . ranolazine (RANEXA) 500 MG 12 hr tablet Take 1 tablet (500 mg total) by mouth 2 (two) times  daily. 180 tablet 1  . tamsulosin (FLOMAX) 0.4 MG CAPS capsule Take 0.4 mg by mouth daily.   4  . tiZANidine (ZANAFLEX) 4 MG tablet Take 1 tablet by mouth every 8 (eight) hours as needed for muscle spasms.   2  . topiramate (TOPAMAX) 25 MG tablet Take 25 mg by mouth 2 (two) times daily.    . traMADol (ULTRAM) 50 MG tablet Take 1 tablet by mouth 3 (three) times daily as needed (pain). Take as directed  2  . VITAMIN D, ERGOCALCIFEROL, PO Take 5,000 Units by mouth daily.     Marland Kitchen ZETIA 10 MG tablet Take 10 mg by mouth daily.   11   No current facility-administered medications for this visit.     Allergies:   Statins; Ace inhibitors; Atenolol; Contrast media [iodinated diagnostic agents]; and Penicillins    Social History:  The patient  reports that he has never smoked. He has never used smokeless tobacco. He reports that he does not drink alcohol or use drugs.   Family History:  The patient's family history includes Heart attack in his brother and father; Heart failure in his mother; Hyperlipidemia in his mother; Hypertension in his mother.    ROS:  Please see the history of present illness.   Otherwise, review of systems are positive for none.   All other systems are reviewed and negative.    PHYSICAL EXAM: VS:  BP 130/80   Pulse 92   Ht 5\' 11"  (1.803 m)   Wt 95.8 kg (211 lb 3.2 oz)   BMI 29.46 kg/m  , BMI Body mass index is 29.46 kg/m. GENERAL:  Well appearing HEENT:  Pupils equal round and reactive, fundi not visualized, oral mucosa unremarkable NECK:  No jugular venous distention, waveform within normal limits, carotid upstroke brisk and symmetric, no bruits LYMPHATICS:  No cervical adenopathy LUNGS:  Rhonchi at right base. HEART:  Irregularly irregular.  PMI not displaced or sustained,S1 and S2 within normal limits, no S3, no S4, no clicks, no rubs, no  murmurs ABD:  Flat, positive bowel sounds normal in frequency in pitch, no bruits, no rebound, no guarding, no midline pulsatile  mass, no hepatomegaly, no splenomegaly EXT:  2 plus pulses throughout, no edema, no cyanosis no clubbing SKIN:  No rashes no nodules NEURO:  Cranial nerves II through XII grossly intact, motor grossly intact throughout PSYCH:  Cognitively intact, oriented to person place and time   EKG:  EKG is not ordered today. The ekg ordered 05/22/16 demonstrates atrial fibrillation rate 68 bpm.   09/28/16: Atrial fibrillation rate 84 bpm.  Non-specific ST changes.  Qt prolongation.    Echo 07/20/16: Study Conclusions  - Left ventricle: The cavity size was normal. Wall thickness was   increased in a pattern of mild LVH. Systolic function was normal.   The estimated ejection fraction was in the range of 50% to 55%.   There is akinesis of the basalinferior myocardium. There is   akinesis of the inferolateral myocardium. Features are consistent   with a pseudonormal left ventricular filling pattern, with   concomitant abnormal relaxation and increased filling pressure   (grade 2 diastolic dysfunction). Doppler parameters are   consistent with high ventricular filling pressure. - Left atrium: The atrium was mildly dilated. - Right atrium: The atrium was mildly dilated.  Impressions:  - Akinesis of the basal inferior wall and inferior lateral wall;   overall low normal LV systolic function; grade 2 diastolic   dysfunction with elevated LV filling pressure; mild biatrial   enlargment; trace MR and TR.  LHC 10/03/16: Ost Cx to Dist Cx lesion, 65 %stenosed.  2nd Mrg lesion, 75 %stenosed.  Dist Cx lesion, 80 %stenosed.  Dist LAD lesion, 90 %stenosed.  Mid LAD lesion, 80 %stenosed.  Prox LAD to Mid LAD lesion, 70 %stenosed.   1st Diag lesion, 75 %stenosed.  1st RPLB lesion, 100 %stenosed.  Prox RCA to Dist RCA lesion, 100 %stenosed.  LV end diastolic pressure is mildly elevated.    Severe diffuse three-vessel coronary disease in this patient with multiple prior stents.  Total  occlusion of the right coronary within the ostial segment. Distal vessel fills by collaterals from the left coronary. The distal right coronary is small and may not be graftable.  Severe diffuse LAD disease with 70% proximal stenosis, 80% mid stenosis, and 90% apical stenosis. The first diagonal which is relatively small contains segmental 70-80% stenosis.  Heavily stented proximal to mid circumflex coronary artery with 50-70% in-stent restenosis, diffuse. Large second obtuse marginal with 75% proximal diffuse narrowing. 70-80% segmental narrowing in the mid circumflex after the origin of the second obtuse marginal.  Mildly elevated LV EDP. Recent echo demonstrating EF greater than 50%. Contrast LV gram not performed due to chronic kidney disease. Total contrast used was 80 cc.  Recent Labs: 03/19/2016: B Natriuretic Peptide 385.9 07/19/2016: Magnesium 2.1; TSH 1.209 10/31/2016: ALT 28 11/02/2016: Hemoglobin 11.4; Platelets 243 11/03/2016: BUN 31; Creatinine, Ser 1.37; Potassium 4.0; Sodium 137    Lipid Panel    Component Value Date/Time   CHOL 123 03/22/2016 0320   TRIG 154 (H) 03/22/2016 0320   HDL 25 (L) 03/22/2016 0320   CHOLHDL 4.9 03/22/2016 0320   VLDL 31 03/22/2016 0320   LDLCALC 67 03/22/2016 0320      Wt Readings from Last 3 Encounters:  12/12/16 95.8 kg (211 lb 3.2 oz)  12/03/16 91.6 kg (202 lb)  11/04/16 94.7 kg (208 lb 12.8 oz)      ASSESSMENT AND PLAN:  #  CAD s/p PCI: # CCS Class III angina:  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  His most recent cath revealed three vessel CAD.  He was referred to Dr. [name] for CABG, but is doing very well now with medical management.  He has done well with home PT and is eager to start cardiac rehab.  We will happily refer him for this program.  Continue medical management for now and plan for surgery only if he worsens clinically.  Continue carvedilol, ranolazine, Zetia and fenofibrate.  He is not on a statin due to  intolertnce.  LDL is <70, so he wouldn't qualify for PCSK9.  He doesn't have much room for optimization of medication due to hypotension.  He is on Eliquis, so no aspirin r P2Y12 inhibitor.  # Chronic systolic and diastolic heart failure:   LVEF 50-55%.  Continue carvedilol and Lasix.   # Hypertension/hypotension: BP well-controlled.  Continue carvedilol.  # Atrial fibrillation: He remains in atrial fibrillation after cardioversion but rates are well-controlled.  Continue Eliquis and carvedilol.  his patients CHA2DS2-VASc Score and unadjusted Ischemic Stroke Rate (% per year) is equal to 7.2 % stroke rate/year from a score of 5  Above score calculated as 1 point each if present [CHF, HTN, DM, Vascular=MI/PAD/Aortic Plaque, Age if 65-74, or Male] Above score calculated as 2 points each if present [Age > 75, or Stroke/TIA/TE]    Current medicines are reviewed at length with the patient today.  The patient does not have concerns regarding medicines.  The following changes have been made:  Add Ranexa  Labs/ tests ordered today include:   No orders of the defined types were placed in this encounter.    Disposition:   FU with Abagael Kramm C. [name], MD, [facility] in 3 months   This note was written with the assistance of speech recognition software.  Please excuse any transcriptional errors.  Signed, Deejay Koppelman C. [name], MD, [facility]  12/12/2016 12:05 PM    [place]

## 2016-12-13 DIAGNOSIS — I110 Hypertensive heart disease with heart failure: Secondary | ICD-10-CM | POA: Diagnosis not present

## 2016-12-13 DIAGNOSIS — I5042 Chronic combined systolic (congestive) and diastolic (congestive) heart failure: Secondary | ICD-10-CM | POA: Diagnosis not present

## 2016-12-13 DIAGNOSIS — I25119 Atherosclerotic heart disease of native coronary artery with unspecified angina pectoris: Secondary | ICD-10-CM | POA: Diagnosis not present

## 2016-12-13 DIAGNOSIS — E1122 Type 2 diabetes mellitus with diabetic chronic kidney disease: Secondary | ICD-10-CM | POA: Diagnosis not present

## 2016-12-13 DIAGNOSIS — J189 Pneumonia, unspecified organism: Secondary | ICD-10-CM | POA: Diagnosis not present

## 2016-12-13 DIAGNOSIS — I481 Persistent atrial fibrillation: Secondary | ICD-10-CM | POA: Diagnosis not present

## 2016-12-14 DIAGNOSIS — J189 Pneumonia, unspecified organism: Secondary | ICD-10-CM | POA: Diagnosis not present

## 2016-12-14 DIAGNOSIS — E1122 Type 2 diabetes mellitus with diabetic chronic kidney disease: Secondary | ICD-10-CM | POA: Diagnosis not present

## 2016-12-14 DIAGNOSIS — Z794 Long term (current) use of insulin: Secondary | ICD-10-CM | POA: Diagnosis not present

## 2016-12-14 DIAGNOSIS — I5022 Chronic systolic (congestive) heart failure: Secondary | ICD-10-CM | POA: Diagnosis not present

## 2016-12-18 DIAGNOSIS — I481 Persistent atrial fibrillation: Secondary | ICD-10-CM | POA: Diagnosis not present

## 2016-12-18 DIAGNOSIS — I110 Hypertensive heart disease with heart failure: Secondary | ICD-10-CM | POA: Diagnosis not present

## 2016-12-18 DIAGNOSIS — J189 Pneumonia, unspecified organism: Secondary | ICD-10-CM | POA: Diagnosis not present

## 2016-12-18 DIAGNOSIS — I25119 Atherosclerotic heart disease of native coronary artery with unspecified angina pectoris: Secondary | ICD-10-CM | POA: Diagnosis not present

## 2016-12-18 DIAGNOSIS — I5042 Chronic combined systolic (congestive) and diastolic (congestive) heart failure: Secondary | ICD-10-CM | POA: Diagnosis not present

## 2016-12-18 DIAGNOSIS — E1122 Type 2 diabetes mellitus with diabetic chronic kidney disease: Secondary | ICD-10-CM | POA: Diagnosis not present

## 2016-12-19 ENCOUNTER — Encounter (HOSPITAL_COMMUNITY): Payer: Self-pay | Admitting: Nurse Practitioner

## 2016-12-19 ENCOUNTER — Ambulatory Visit (HOSPITAL_COMMUNITY)
Admission: RE | Admit: 2016-12-19 | Discharge: 2016-12-19 | Disposition: A | Discharge status: Home / Self Care | Payer: Medicare Other | Source: Ambulatory Visit | Attending: Internal Medicine | Admitting: Internal Medicine

## 2016-12-19 ENCOUNTER — Ambulatory Visit: Payer: Medicare Other | Admitting: Internal Medicine

## 2016-12-19 VITALS — BP 126/84 | HR 109 | Ht 71.0 in | Wt 208.0 lb

## 2016-12-19 DIAGNOSIS — I5042 Chronic combined systolic (congestive) and diastolic (congestive) heart failure: Secondary | ICD-10-CM | POA: Insufficient documentation

## 2016-12-19 DIAGNOSIS — F329 Major depressive disorder, single episode, unspecified: Secondary | ICD-10-CM | POA: Insufficient documentation

## 2016-12-19 DIAGNOSIS — Z880 Allergy status to penicillin: Secondary | ICD-10-CM | POA: Diagnosis not present

## 2016-12-19 DIAGNOSIS — Z794 Long term (current) use of insulin: Secondary | ICD-10-CM | POA: Diagnosis not present

## 2016-12-19 DIAGNOSIS — E1122 Type 2 diabetes mellitus with diabetic chronic kidney disease: Secondary | ICD-10-CM | POA: Insufficient documentation

## 2016-12-19 DIAGNOSIS — I2510 Atherosclerotic heart disease of native coronary artery without angina pectoris: Secondary | ICD-10-CM | POA: Diagnosis not present

## 2016-12-19 DIAGNOSIS — M0609 Rheumatoid arthritis without rheumatoid factor, multiple sites: Secondary | ICD-10-CM | POA: Diagnosis not present

## 2016-12-19 DIAGNOSIS — N183 Chronic kidney disease, stage 3 (moderate): Secondary | ICD-10-CM | POA: Diagnosis not present

## 2016-12-19 DIAGNOSIS — E7800 Pure hypercholesterolemia, unspecified: Secondary | ICD-10-CM | POA: Insufficient documentation

## 2016-12-19 DIAGNOSIS — Z888 Allergy status to other drugs, medicaments and biological substances status: Secondary | ICD-10-CM | POA: Insufficient documentation

## 2016-12-19 DIAGNOSIS — G4733 Obstructive sleep apnea (adult) (pediatric): Secondary | ICD-10-CM | POA: Insufficient documentation

## 2016-12-19 DIAGNOSIS — I481 Persistent atrial fibrillation: Secondary | ICD-10-CM | POA: Insufficient documentation

## 2016-12-19 DIAGNOSIS — Z981 Arthrodesis status: Secondary | ICD-10-CM | POA: Diagnosis not present

## 2016-12-19 DIAGNOSIS — Z79899 Other long term (current) drug therapy: Secondary | ICD-10-CM | POA: Diagnosis not present

## 2016-12-19 DIAGNOSIS — Z91041 Radiographic dye allergy status: Secondary | ICD-10-CM | POA: Diagnosis not present

## 2016-12-19 DIAGNOSIS — Z955 Presence of coronary angioplasty implant and graft: Secondary | ICD-10-CM | POA: Insufficient documentation

## 2016-12-19 DIAGNOSIS — Z8249 Family history of ischemic heart disease and other diseases of the circulatory system: Secondary | ICD-10-CM | POA: Insufficient documentation

## 2016-12-19 DIAGNOSIS — I130 Hypertensive heart and chronic kidney disease with heart failure and stage 1 through stage 4 chronic kidney disease, or unspecified chronic kidney disease: Secondary | ICD-10-CM | POA: Insufficient documentation

## 2016-12-19 DIAGNOSIS — I252 Old myocardial infarction: Secondary | ICD-10-CM | POA: Diagnosis not present

## 2016-12-19 DIAGNOSIS — Z7901 Long term (current) use of anticoagulants: Secondary | ICD-10-CM | POA: Insufficient documentation

## 2016-12-19 DIAGNOSIS — M797 Fibromyalgia: Secondary | ICD-10-CM | POA: Diagnosis not present

## 2016-12-19 DIAGNOSIS — I4891 Unspecified atrial fibrillation: Secondary | ICD-10-CM | POA: Diagnosis present

## 2016-12-19 DIAGNOSIS — M1990 Unspecified osteoarthritis, unspecified site: Secondary | ICD-10-CM | POA: Insufficient documentation

## 2016-12-19 DIAGNOSIS — I4819 Other persistent atrial fibrillation: Secondary | ICD-10-CM

## 2016-12-19 NOTE — Progress Notes (Signed)
Primary Care Physician: Julian Hams, MD Referring Physician: Dr. Gemma Payor Ross is a 70 y.o. 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 He has h/o chronic systolic and diastolic heart failure, LVEF improved from 30-35% to 50-55%, paroxysmal atrial fibrillation, CAD s/p multiple PCIs, who presents for follow up.  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  During that hospitalization he also had elevated troponin consistent with demand ischemia (troponin 0.13) and new onset atrial fibrillation with RVR.  He was diuresed to a discharge weight of 196 lb (from 208 lb on admit).  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  He previously had 16 stents placed in [place], [place]  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  After cardioversion he developed bradycardia and initially metoprolol was reduced.  Then he was switched from metoprolol to carvedilol.  This was further reduced to 6.25mg  bid on 06/19/16.  On 7/21 he had a recurrent episode of syncope. Hydralazine and isosorbide were discontinued.  Troponin was mildly elevated at 0.03.  CT of the head was negative, and chest x-ray showed no acute edema or infiltrate.  During that hospitalization he was noted to be orthostatic.  His SBP dropped to 80 when standing.  Carvedilol was reduced 1.5625 mg bid and his orthostasis was improved.  A 30 day event monitor was placed and it was recommended that outpatient Myoview be considered.  Echo that admission showed an improvement in his LVEF to 50-55%. He wore an event monitor 07/31/16 that revealed short runs of atrial fibrillation as well as PVCs and PACs.  He was seen by Dr. Rayann Ross 10/11  and was in afib , rate controlled. His recommendation was given paucity of symptoms, rate control was reasonable. He did not think he was a good candidate for ablation. Tikosyn or amiodarone would be  options, but the pt was not interested in Tikosyn and amiodarone  thought best to avoid. He was referred to Dr. Roxy Ross for consideration for bypass/maze however, he was diagnosed with pneumonia prior to visit and his with his state of health at the time of visit with Dr. Roxy Ross, it was felt to be best to not undertake surgery.  He states that the afib is not currently bothering him. He went to the wrong entrance to the clinic and walked a considerable distance and was not symptomatic during the walk. His heart rate after that distance was just 107 bpm. He states at home his v rates are less than 100. He currently is happy with rate control. He is pending another visit with Dr. Roxy Ross in March, now that overall health has improved, to revisit if surgery is feasible. He continues on apixaban 5 mg bid for a chadsvasc score of 5. He does have OSA and uses cpap nightly. No alcohol, minimal caffeine.  Today, he denies symptoms of palpitations, chest pain, shortness of breath, orthopnea, PND, lower extremity edema, dizziness, presyncope, syncope, or neurologic sequela. The patient is tolerating medications without difficulties and is otherwise without complaint today.   Past Medical History:  Diagnosis Date  . Anemia   . Anxiety state   . CAD (coronary artery disease)    16 prior stents (at [facility])  . Cardiomyopathy, ischemic   . Chronic lower back pain   . Chronic systolic CHF (congestive heart failure) ([place])   . CKD (chronic kidney disease), stage III   .  Depression   . Diabetes mellitus type 2 in obese ([place])   . Fibromyalgia   . Hepatitis A    at age 54  . High cholesterol   . History of blood transfusion    "related to OR"  . Hypertension   . Hypokalemia   . MI (myocardial infarction) 1990; 1995; 1997  . OSA on CPAP   . Osteoarthritis   . Persistent atrial fibrillation ([place]) 02/2016   occured in setting of pneumonia  . Rheumatoid arthritis of multiple sites with negative rheumatoid  factor ([place])   . Septic shock ([place]) 02/2016   in setting of severe pneumonia   Past Surgical History:  Procedure Laterality Date  . BACK SURGERY    . CARDIAC CATHETERIZATION N/A 10/03/2016   Procedure: Left Heart Cath and Coronary Angiography;  Surgeon: Julian Crome, MD;  Location: [facility];  Service: Cardiovascular;  Laterality: N/A;  . CARDIOVERSION N/A 06/04/2016   Procedure: CARDIOVERSION;  Surgeon: Julian Latch, MD;  Location: [facility];  Service: Cardiovascular;  Laterality: N/A;  . CATARACT EXTRACTION W/ INTRAOCULAR LENS  IMPLANT, BILATERAL Bilateral 2000s  . CORONARY ANGIOPLASTY  early 66s  . CORONARY ANGIOPLASTY WITH STENT PLACEMENT     "I've got 15 stents" (07/19/2016)  . KNEE ARTHROSCOPY Left 1990s  . POSTERIOR LUMBAR FUSION  2015   [id number]  . right knee surgery     age of 2  . TONSILLECTOMY AND ADENOIDECTOMY  1949    Current Outpatient Prescriptions  Medication Sig Dispense Refill  . acetaminophen (TYLENOL) 325 MG tablet Take 650 mg by mouth every 4 (four) hours as needed for mild pain or headache.    . ALPRAZolam (NIRAVAM) 0.5 MG dissolvable tablet Take 1 tablet by mouth as needed (Take as directed).   2  . apixaban (ELIQUIS) 5 MG TABS tablet Take 1 tablet (5 mg total) by mouth 2 (two) times daily. 60 tablet   . carvedilol (COREG) 6.25 MG tablet Take 6.25 mg by mouth 2 (two) times daily with a meal.    . clotrimazole (LOTRIMIN) 1 % cream Apply 1 application topically 2 (two) times daily. 30 g 0  . fenofibrate (TRICOR) 145 MG tablet Take 145 mg by mouth daily.    . folic acid (FOLVITE) 1 MG tablet Take 1 mg by mouth daily.    . furosemide (LASIX) 20 MG tablet Take 20 mg by mouth daily.     Marland Kitchen gabapentin (NEURONTIN) 300 MG capsule Take 300 mg by mouth at bedtime.   2  . hydrocortisone (ANUSOL-HC) 2.5 % rectal cream Place 1 application rectally 2 (two) times daily as needed for hemorrhoids or itching. 30 g 0  . insulin lispro protamine-lispro (HUMALOG  50/50 MIX) (50-50) 100 UNIT/ML SUSP injection Inject 40-50 Units into the skin See admin instructions. Takes 50 units in am, 40 units midday, 50 units in pm    . leucovorin (WELLCOVORIN) 10 MG tablet Take 10 mg by mouth See admin instructions. Take 1 tablet (10 mg) by mouth every 12 hours and 24 hours after the methotrexate dose    . LUMIGAN 0.01 % SOLN Place 1 drop into both eyes at bedtime.   0  . magnesium oxide (MAG-OX) 400 (241.3 Mg) MG tablet Take 1 tablet (400 mg total) by mouth 2 (two) times daily. (Patient taking differently: Take 400 mg by mouth daily. ) 60 tablet 0  . Methotrexate Sodium (METHOTREXATE, PF,) 50 MG/2ML injection Inject 15 mg into the muscle every Friday.  0  . Misc Natural Products (OSTEO BI-FLEX ADV DOUBLE ST PO) Take 1 tablet by mouth daily as needed (takes occassionally).     Marland Kitchen omega-3 acid ethyl esters (LOVAZA) 1 g capsule Take 1 capsule by mouth 2 (two) times daily.  11  . omeprazole (PRILOSEC) 20 MG capsule Take 20 mg by mouth daily as needed (heartburn or acid reflux).    Marland Kitchen PARoxetine (PAXIL) 40 MG tablet Take 40 mg by mouth daily.   11  . potassium chloride SA (K-DUR,KLOR-CON) 20 MEQ tablet Take 20 mEq by mouth daily.     . predniSONE (DELTASONE) 5 MG tablet Take 5 mg by mouth daily with breakfast.    . ranolazine (RANEXA) 500 MG 12 hr tablet Take 1 tablet (500 mg total) by mouth 2 (two) times daily. 180 tablet 1  . tamsulosin (FLOMAX) 0.4 MG CAPS capsule Take 0.4 mg by mouth daily.   4  . tiZANidine (ZANAFLEX) 4 MG tablet Take 1 tablet by mouth every 8 (eight) hours as needed for muscle spasms.   2  . topiramate (TOPAMAX) 25 MG tablet Take 25 mg by mouth 2 (two) times daily.    . traMADol (ULTRAM) 50 MG tablet Take 1 tablet by mouth 3 (three) times daily as needed (pain). Take as directed  2  . VITAMIN D, ERGOCALCIFEROL, PO Take 5,000 Units by mouth daily.     Marland Kitchen ZETIA 10 MG tablet Take 10 mg by mouth daily.   11   No current facility-administered medications for  this encounter.     Allergies  Allergen Reactions  . Statins Other (See Comments)    Causes stiffness in joints   . Ace Inhibitors Cough  . Atenolol Other (See Comments)    Unknown reaction  . Contrast Media [Iodinated Diagnostic Agents] Rash    Has to take benadryl prior to use  . Penicillins Hives and Rash    Has patient had a PCN reaction causing immediate rash, facial/tongue/throat swelling, SOB or lightheadedness with hypotension: Yes Has patient had a PCN reaction causing severe rash involving mucus membranes or skin necrosis: No Has patient had a PCN reaction that required hospitalization pt was in the hospital at time of last reaction - heart attack Has patient had a PCN reaction occurring within the last 10 years: No If all of the above answers are "NO", then may proceed with Cephalosporin use.    Social History   Social History  . Marital status: Married    Spouse name: N/A  . Number of children: N/A  . Years of education: N/A   Occupational History  . Not on file.   Social History Main Topics  . Smoking status: Never Smoker  . Smokeless tobacco: Never Used  . Alcohol use No  . Drug use: No  . Sexual activity: Not Currently   Other Topics Concern  . Not on file   Social History Narrative   Micheal Likens, Dentist retired on disability.  Lived in [place] but recently moved to [place].    Family History  Problem Relation Age of Onset  . Heart failure Mother   . Hyperlipidemia Mother   . Hypertension Mother   . Heart attack Father   . Heart attack Brother     ROS- All systems are reviewed and negative except as per the HPI above  Physical Exam: Vitals:   12/19/16 1135  BP: 126/84  Pulse: (!) 109  Weight: 208 lb (94.3 kg)  Height: 5\' 11"  (1.803 m)  Wt Readings from Last 3 Encounters:  12/19/16 208 lb (94.3 kg)  12/12/16 211 lb 3.2 oz (95.8 kg)  12/03/16 202 lb (91.6 kg)    Labs: Lab Results  Component Value Date   NA 137 11/03/2016   K 4.0  11/03/2016   CL 107 11/03/2016   CO2 27 11/03/2016   GLUCOSE 84 11/03/2016   BUN 31 (H) 11/03/2016   CREATININE 1.37 (H) 11/03/2016   CALCIUM 9.3 11/03/2016   PHOS 1.9 (L) 11/02/2016   MG 2.1 07/19/2016   Lab Results  Component Value Date   INR 1.75 10/30/2016   Lab Results  Component Value Date   CHOL 123 03/22/2016   HDL 25 (L) 03/22/2016   LDLCALC 67 03/22/2016   TRIG 154 (H) 03/22/2016     GEN- The patient is well appearing, alert and oriented x 3 today.   Head- normocephalic, atraumatic Eyes-  Sclera clear, conjunctiva pink Ears- hearing intact Oropharynx- clear Neck- supple, no JVP Lymph- no cervical lymphadenopathy Lungs- Clear to ausculation bilaterally, normal work of breathing Heart- irregular rate and rhythm, no murmurs, rubs or gallops, PMI not laterally displaced GI- soft, NT, ND, + BS Extremities- no clubbing, cyanosis, or edema MS- no significant deformity or atrophy Skin- no rash or lesion Psych- euthymic mood, full affect Neuro- strength and sensation are intact  EKG- afib at 109 bpm, qrs int 108 ms, qtc 474 ms Epic records reviewed Echo-Study Conclusions  - Left ventricle: The cavity size was normal. Wall thickness was   increased in a pattern of mild LVH. Systolic function was normal.   The estimated ejection fraction was in the range of 50% to 55%.   There is akinesis of the basalinferior myocardium. There is   akinesis of the inferolateral myocardium. Features are consistent   with a pseudonormal left ventricular filling pattern, with   concomitant abnormal relaxation and increased filling pressure   (grade 2 diastolic dysfunction). Doppler parameters are   consistent with high ventricular filling pressure. - Left atrium: The atrium was mildly dilated. - Right atrium: The atrium was mildly dilated.  Impressions:  - Akinesis of the basal inferior wall and inferior lateral wall;   overall low normal LV systolic function; grade 2  diastolic   dysfunction with elevated LV filling pressure; mild biatrial   enlargment; trace MR and TR.    Assessment and Plan: 1. Persistent asymptomatic afib Pt is currently happy to stay with rate controlled afib , will defer to Dr. Jackalyn Lombard previous recommendations Continue carvedilol at 6.25 mg bid  Continue apixaban 5 mg bid for chadsvasc score of 5  2. HTN Stable  3. CAD Currently without exertional chest pain He does have f/u with Dr. Roxy Ross in March to further discuss current health status and see if  bypass/maze surgery would be feasible  F/u with Dr. [name] 03/15/17  Afib clinic as needed  Butch Penny C. Kmari Halter, [facility] [address] [place], [place] [address] [phone]

## 2016-12-20 DIAGNOSIS — G629 Polyneuropathy, unspecified: Secondary | ICD-10-CM | POA: Diagnosis not present

## 2016-12-20 DIAGNOSIS — E559 Vitamin D deficiency, unspecified: Secondary | ICD-10-CM | POA: Diagnosis not present

## 2016-12-20 DIAGNOSIS — R748 Abnormal levels of other serum enzymes: Secondary | ICD-10-CM | POA: Diagnosis not present

## 2016-12-20 DIAGNOSIS — J189 Pneumonia, unspecified organism: Secondary | ICD-10-CM | POA: Diagnosis not present

## 2016-12-20 DIAGNOSIS — I481 Persistent atrial fibrillation: Secondary | ICD-10-CM | POA: Diagnosis not present

## 2016-12-20 DIAGNOSIS — I110 Hypertensive heart disease with heart failure: Secondary | ICD-10-CM | POA: Diagnosis not present

## 2016-12-20 DIAGNOSIS — I25119 Atherosclerotic heart disease of native coronary artery with unspecified angina pectoris: Secondary | ICD-10-CM | POA: Diagnosis not present

## 2016-12-20 DIAGNOSIS — E1122 Type 2 diabetes mellitus with diabetic chronic kidney disease: Secondary | ICD-10-CM | POA: Diagnosis not present

## 2016-12-20 DIAGNOSIS — M0579 Rheumatoid arthritis with rheumatoid factor of multiple sites without organ or systems involvement: Secondary | ICD-10-CM | POA: Diagnosis not present

## 2016-12-20 DIAGNOSIS — I5042 Chronic combined systolic (congestive) and diastolic (congestive) heart failure: Secondary | ICD-10-CM | POA: Diagnosis not present

## 2016-12-20 DIAGNOSIS — N189 Chronic kidney disease, unspecified: Secondary | ICD-10-CM | POA: Diagnosis not present

## 2016-12-25 DIAGNOSIS — I481 Persistent atrial fibrillation: Secondary | ICD-10-CM | POA: Diagnosis not present

## 2016-12-25 DIAGNOSIS — E1122 Type 2 diabetes mellitus with diabetic chronic kidney disease: Secondary | ICD-10-CM | POA: Diagnosis not present

## 2016-12-25 DIAGNOSIS — I110 Hypertensive heart disease with heart failure: Secondary | ICD-10-CM | POA: Diagnosis not present

## 2016-12-25 DIAGNOSIS — J189 Pneumonia, unspecified organism: Secondary | ICD-10-CM | POA: Diagnosis not present

## 2016-12-25 DIAGNOSIS — I5042 Chronic combined systolic (congestive) and diastolic (congestive) heart failure: Secondary | ICD-10-CM | POA: Diagnosis not present

## 2016-12-25 DIAGNOSIS — I25119 Atherosclerotic heart disease of native coronary artery with unspecified angina pectoris: Secondary | ICD-10-CM | POA: Diagnosis not present

## 2016-12-26 DIAGNOSIS — I481 Persistent atrial fibrillation: Secondary | ICD-10-CM | POA: Diagnosis not present

## 2016-12-26 DIAGNOSIS — I5042 Chronic combined systolic (congestive) and diastolic (congestive) heart failure: Secondary | ICD-10-CM | POA: Diagnosis not present

## 2016-12-26 DIAGNOSIS — J189 Pneumonia, unspecified organism: Secondary | ICD-10-CM | POA: Diagnosis not present

## 2016-12-26 DIAGNOSIS — E1122 Type 2 diabetes mellitus with diabetic chronic kidney disease: Secondary | ICD-10-CM | POA: Diagnosis not present

## 2016-12-26 DIAGNOSIS — I25119 Atherosclerotic heart disease of native coronary artery with unspecified angina pectoris: Secondary | ICD-10-CM | POA: Diagnosis not present

## 2016-12-26 DIAGNOSIS — I110 Hypertensive heart disease with heart failure: Secondary | ICD-10-CM | POA: Diagnosis not present

## 2016-12-28 DIAGNOSIS — I110 Hypertensive heart disease with heart failure: Secondary | ICD-10-CM | POA: Diagnosis not present

## 2016-12-28 DIAGNOSIS — I481 Persistent atrial fibrillation: Secondary | ICD-10-CM | POA: Diagnosis not present

## 2016-12-28 DIAGNOSIS — I25119 Atherosclerotic heart disease of native coronary artery with unspecified angina pectoris: Secondary | ICD-10-CM | POA: Diagnosis not present

## 2016-12-28 DIAGNOSIS — E1122 Type 2 diabetes mellitus with diabetic chronic kidney disease: Secondary | ICD-10-CM | POA: Diagnosis not present

## 2016-12-28 DIAGNOSIS — J189 Pneumonia, unspecified organism: Secondary | ICD-10-CM | POA: Diagnosis not present

## 2016-12-28 DIAGNOSIS — I5042 Chronic combined systolic (congestive) and diastolic (congestive) heart failure: Secondary | ICD-10-CM | POA: Diagnosis not present

## 2017-01-01 DIAGNOSIS — I25119 Atherosclerotic heart disease of native coronary artery with unspecified angina pectoris: Secondary | ICD-10-CM | POA: Diagnosis not present

## 2017-01-01 DIAGNOSIS — I5042 Chronic combined systolic (congestive) and diastolic (congestive) heart failure: Secondary | ICD-10-CM | POA: Diagnosis not present

## 2017-01-01 DIAGNOSIS — E1122 Type 2 diabetes mellitus with diabetic chronic kidney disease: Secondary | ICD-10-CM | POA: Diagnosis not present

## 2017-01-01 DIAGNOSIS — I481 Persistent atrial fibrillation: Secondary | ICD-10-CM | POA: Diagnosis not present

## 2017-01-01 DIAGNOSIS — J189 Pneumonia, unspecified organism: Secondary | ICD-10-CM | POA: Diagnosis not present

## 2017-01-01 DIAGNOSIS — I110 Hypertensive heart disease with heart failure: Secondary | ICD-10-CM | POA: Diagnosis not present

## 2017-01-03 DIAGNOSIS — I110 Hypertensive heart disease with heart failure: Secondary | ICD-10-CM | POA: Diagnosis not present

## 2017-01-03 DIAGNOSIS — I5042 Chronic combined systolic (congestive) and diastolic (congestive) heart failure: Secondary | ICD-10-CM | POA: Diagnosis not present

## 2017-01-03 DIAGNOSIS — I481 Persistent atrial fibrillation: Secondary | ICD-10-CM | POA: Diagnosis not present

## 2017-01-03 DIAGNOSIS — J189 Pneumonia, unspecified organism: Secondary | ICD-10-CM | POA: Diagnosis not present

## 2017-01-03 DIAGNOSIS — I25119 Atherosclerotic heart disease of native coronary artery with unspecified angina pectoris: Secondary | ICD-10-CM | POA: Diagnosis not present

## 2017-01-03 DIAGNOSIS — E1122 Type 2 diabetes mellitus with diabetic chronic kidney disease: Secondary | ICD-10-CM | POA: Diagnosis not present

## 2017-01-11 ENCOUNTER — Other Ambulatory Visit: Payer: Self-pay | Admitting: Internal Medicine

## 2017-01-11 ENCOUNTER — Ambulatory Visit
Admission: RE | Admit: 2017-01-11 | Discharge: 2017-01-11 | Disposition: A | Discharge status: Home / Self Care | Payer: Medicare Other | Source: Ambulatory Visit | Attending: Internal Medicine | Admitting: Internal Medicine

## 2017-01-11 DIAGNOSIS — R05 Cough: Secondary | ICD-10-CM | POA: Diagnosis not present

## 2017-01-11 DIAGNOSIS — J189 Pneumonia, unspecified organism: Secondary | ICD-10-CM | POA: Diagnosis not present

## 2017-01-11 DIAGNOSIS — R079 Chest pain, unspecified: Secondary | ICD-10-CM | POA: Diagnosis not present

## 2017-01-11 DIAGNOSIS — I509 Heart failure, unspecified: Secondary | ICD-10-CM | POA: Diagnosis not present

## 2017-01-11 DIAGNOSIS — J690 Pneumonitis due to inhalation of food and vomit: Secondary | ICD-10-CM

## 2017-02-07 DIAGNOSIS — J84112 Idiopathic pulmonary fibrosis: Secondary | ICD-10-CM | POA: Diagnosis not present

## 2017-02-07 DIAGNOSIS — G4733 Obstructive sleep apnea (adult) (pediatric): Secondary | ICD-10-CM | POA: Diagnosis not present

## 2017-02-13 DIAGNOSIS — Z79899 Other long term (current) drug therapy: Secondary | ICD-10-CM | POA: Diagnosis not present

## 2017-02-13 DIAGNOSIS — E559 Vitamin D deficiency, unspecified: Secondary | ICD-10-CM | POA: Diagnosis not present

## 2017-02-13 DIAGNOSIS — M0579 Rheumatoid arthritis with rheumatoid factor of multiple sites without organ or systems involvement: Secondary | ICD-10-CM | POA: Diagnosis not present

## 2017-02-13 DIAGNOSIS — M25542 Pain in joints of left hand: Secondary | ICD-10-CM | POA: Diagnosis not present

## 2017-02-13 DIAGNOSIS — M25541 Pain in joints of right hand: Secondary | ICD-10-CM | POA: Diagnosis not present

## 2017-02-13 DIAGNOSIS — G629 Polyneuropathy, unspecified: Secondary | ICD-10-CM | POA: Diagnosis not present

## 2017-02-13 DIAGNOSIS — R251 Tremor, unspecified: Secondary | ICD-10-CM | POA: Diagnosis not present

## 2017-02-19 ENCOUNTER — Other Ambulatory Visit: Payer: Self-pay | Admitting: Adult Health

## 2017-02-25 ENCOUNTER — Other Ambulatory Visit: Payer: Self-pay

## 2017-02-25 MED ORDER — CARVEDILOL 6.25 MG PO TABS: 6.2500 mg | ORAL_TABLET | Freq: Two times a day (BID) | ORAL | 3 refills | Status: DC

## 2017-02-25 NOTE — Telephone Encounter (Signed)
Rx(s) sent to pharmacy electronically.  

## 2017-02-28 ENCOUNTER — Ambulatory Visit: Payer: Medicare Other | Admitting: Cardiovascular Disease

## 2017-02-28 NOTE — Progress Notes (Deleted)
Cardiology Office Note   Date:  02/28/2017   ID:  Julian Ross, DOB July 24, 1946, MRN [id number]  PCP:  Kandice Hams, MD  Cardiologist:   Skeet Latch, MD   No Julian complaint on file.    History of Present Illness: Julian Ross is a 71 y.o. male with chronic systolic and diastolic heart failure LVEF improved from 30-35% to 50-55%, paroxysmal atrial fibrillation, CAD s/p multiple PCIs, who presents for follow up.  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  During that hospitalization he also had elevated troponin consistent with demand ischemia (troponin 0.13) and new onset atrial fibrillation with RVR.  He was diuresed to a discharge weight of 196 lb (from 208 lb on admit).  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  He previously had 16 stents placed in [place], [place].  His cardiologist there is [name].  The inpatient team contacted [name], who informed them that Julian Ross has a tight RCA lesion but no recent interventions.  He felt that Julian Ross could transition from Plavix to aspirin.  He has been intolerant to statins in the past.    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  After cardioversion he developed bradycardia and initially metoprolol was reduced.  Then he was switched from metoprolol to carvedilol.  This was further reduced to 6.25mg  bid on 06/19/16.  On 7/21 he had a recurrent episode of syncope. Hydralazine and isosorbide were discontinued.  Troponin was mildly elevated at 0.03.  CT of the head was negative, and chest x-ray showed no acute edema or infiltrate.  During that hospitalization he was noted to be orthostatic.  His SBP dropped to 80 when standing.  Carvedilol was reduced 1.5625 mg bid and his orthostasis was improved.  A 30 day event monitor was placed and it was recommended that outpatient Myoview be considered.  Echo that admission showed an  improvement in his LVEF to 50-55%. He wore an event monitor 07/31/16 that revealed short runs of atrial fibrillation as well as PVCs and PACs.  At his last appointment Dr. [name] reported episodes of angina. He was referred for cardiac catheterization where he was noted to have severe, diffuse, three-vessel coronary disease with multiple areas of prior stenting. A recommendation was made for intensification of medical therapy or consideration of coronary artery bypass grafting. He also was evaluated by [name] for management of his atrial fibrillation.  He was felt to be a poor candidate for ablation.  Amiodarone and dofetilide would be options, but it was unclear whether atrial fibrillation was actually causing symptoms.  [name] suggested considering CABG with a surgical MAZE and clipping of the LAA.  Since his last appointment Dr. [name] saw Dr. [name] and was scheduled to have CABG/MAZE.  However, he then developed pneumonia requiring hospitalization.  He developed demand ischemia and was managed medically.  He required hospitalization in the ICU and was discharged with home PT.  He followed up with Dr. [name] and he was doing well, so a decision was made to defer his surgery.  Since his hospitalization Dr. [name] has been doing much better.  He is slowly getting stronger.  He feels that Ranexa has helped and he denies chest pain.  He has been doing physical therapy at home and wants to start going to cardiac rehab.  He breathing has been stable and he denies lower extremity edema, orthopnea or PND.  Prior to his hospitalization he fell and hurt his leg.  He was referred to ortho but never followed up due to the hospitalization.   He was seen in the atrial fibrillation clinic 11/2016 and asymptomatic so no changes occurred.  Past Medical History:  Diagnosis Date  . Anemia   . Anxiety state   . CAD (coronary artery disease)    16 prior stents (at [facility])  . Cardiomyopathy, ischemic   .  Chronic lower back pain   . Chronic systolic CHF (congestive heart failure) ([place])   . CKD (chronic kidney disease), stage III   . Depression   . Diabetes mellitus type 2 in obese ([place])   . Fibromyalgia   . Hepatitis A    at age 46  . High cholesterol   . History of blood transfusion    "related to OR"  . Hypertension   . Hypokalemia   . MI (myocardial infarction) 1990; 1995; 1997  . OSA on CPAP   . Osteoarthritis   . Persistent atrial fibrillation ([place]) 02/2016   occured in setting of pneumonia  . Rheumatoid arthritis of multiple sites with negative rheumatoid factor ([place])   . Septic shock ([place]) 02/2016   in setting of severe pneumonia    Past Surgical History:  Procedure Laterality Date  . BACK SURGERY    . CARDIAC CATHETERIZATION N/A 10/03/2016   Procedure: Left Heart Cath and Coronary Angiography;  Surgeon: Belva Crome, MD;  Location: [facility];  Service: Cardiovascular;  Laterality: N/A;  . CARDIOVERSION N/A 06/04/2016   Procedure: CARDIOVERSION;  Surgeon: Skeet Latch, MD;  Location: [facility];  Service: Cardiovascular;  Laterality: N/A;  . CATARACT EXTRACTION W/ INTRAOCULAR LENS  IMPLANT, BILATERAL Bilateral 2000s  . CORONARY ANGIOPLASTY  early 69s  . CORONARY ANGIOPLASTY WITH STENT PLACEMENT     "I've got 15 stents" (07/19/2016)  . KNEE ARTHROSCOPY Left 1990s  . POSTERIOR LUMBAR FUSION  2015   L4-5-S1  . right knee surgery     age of 52  . TONSILLECTOMY AND ADENOIDECTOMY  1949     Current Outpatient Prescriptions  Medication Sig Dispense Refill  . acetaminophen (TYLENOL) 325 MG tablet Take 650 mg by mouth every 4 (four) hours as needed for mild pain or headache.    . ALPRAZolam (NIRAVAM) 0.5 MG dissolvable tablet Take 1 tablet by mouth as needed (Take as directed).   2  . apixaban (ELIQUIS) 5 MG TABS tablet Take 1 tablet (5 mg total) by mouth 2 (two) times daily. 60 tablet   . carvedilol (COREG) 6.25 MG tablet Take 1 tablet (6.25 mg total) by  mouth 2 (two) times daily with a meal. 180 tablet 3  . clotrimazole (LOTRIMIN) 1 % cream Apply 1 application topically 2 (two) times daily. 30 g 0  . fenofibrate (TRICOR) 145 MG tablet Take 145 mg by mouth daily.    . folic acid (FOLVITE) 1 MG tablet Take 1 mg by mouth daily.    . furosemide (LASIX) 20 MG tablet Take 20 mg by mouth daily.     Marland Kitchen gabapentin (NEURONTIN) 300 MG capsule Take 300 mg by mouth at bedtime.   2  . hydrocortisone (ANUSOL-HC) 2.5 % rectal cream Place 1 application rectally 2 (two) times daily as needed for hemorrhoids or itching. 30 g 0  . insulin lispro protamine-lispro (HUMALOG 50/50 MIX) (50-50) 100 UNIT/ML SUSP injection Inject 40-50 Units into the skin See admin instructions. Takes 50 units in am,  40 units midday, 50 units in pm    . leucovorin (WELLCOVORIN) 10 MG tablet Take 10 mg by mouth See admin instructions. Take 1 tablet (10 mg) by mouth every 12 hours and 24 hours after the methotrexate dose    . LUMIGAN 0.01 % SOLN Place 1 drop into both eyes at bedtime.   0  . magnesium oxide (MAG-OX) 400 (241.3 Mg) MG tablet Take 1 tablet (400 mg total) by mouth 2 (two) times daily. (Patient taking differently: Take 400 mg by mouth daily. ) 60 tablet 0  . Methotrexate Sodium (METHOTREXATE, PF,) 50 MG/2ML injection Inject 15 mg into the muscle every Friday.   0  . Misc Natural Products (OSTEO BI-FLEX ADV DOUBLE ST PO) Take 1 tablet by mouth daily as needed (takes occassionally).     Marland Kitchen omega-3 acid ethyl esters (LOVAZA) 1 g capsule Take 1 capsule by mouth 2 (two) times daily.  11  . omeprazole (PRILOSEC) 20 MG capsule Take 20 mg by mouth daily as needed (heartburn or acid reflux).    Marland Kitchen PARoxetine (PAXIL) 40 MG tablet Take 40 mg by mouth daily.   11  . potassium chloride SA (K-DUR,KLOR-CON) 20 MEQ tablet Take 20 mEq by mouth daily.     . predniSONE (DELTASONE) 5 MG tablet Take 5 mg by mouth daily with breakfast.    . ranolazine (RANEXA) 500 MG 12 hr tablet Take 1 tablet (500 mg  total) by mouth 2 (two) times daily. 180 tablet 1  . tamsulosin (FLOMAX) 0.4 MG CAPS capsule Take 0.4 mg by mouth daily.   4  . tiZANidine (ZANAFLEX) 4 MG tablet Take 1 tablet by mouth every 8 (eight) hours as needed for muscle spasms.   2  . topiramate (TOPAMAX) 25 MG tablet Take 25 mg by mouth 2 (two) times daily.    . traMADol (ULTRAM) 50 MG tablet Take 1 tablet by mouth 3 (three) times daily as needed (pain). Take as directed  2  . VITAMIN D, ERGOCALCIFEROL, PO Take 5,000 Units by mouth daily.     Marland Kitchen ZETIA 10 MG tablet Take 10 mg by mouth daily.   11   No current facility-administered medications for this visit.     Allergies:   Statins; Ace inhibitors; Atenolol; Contrast media [iodinated diagnostic agents]; and Penicillins    Social History:  The patient  reports that he has never smoked. He has never used smokeless tobacco. He reports that he does not drink alcohol or use drugs.   Family History:  The patient's family history includes Heart attack in his brother and father; Heart failure in his mother; Hyperlipidemia in his mother; Hypertension in his mother.    ROS:  Please see the history of present illness.   Otherwise, review of systems are positive for none.   All other systems are reviewed and negative.    PHYSICAL EXAM: VS:  There were no vitals taken for this visit. , BMI There is no height or weight on file to calculate BMI. GENERAL:  Well appearing HEENT:  Pupils equal round and reactive, fundi not visualized, oral mucosa unremarkable NECK:  No jugular venous distention, waveform within normal limits, carotid upstroke brisk and symmetric, no bruits LYMPHATICS:  No cervical adenopathy LUNGS:  Rhonchi at right base. HEART:  Irregularly irregular.  PMI not displaced or sustained,S1 and S2 within normal limits, no S3, no S4, no clicks, no rubs, no murmurs ABD:  Flat, positive bowel sounds normal in frequency in pitch, no  bruits, no rebound, no guarding, no midline pulsatile  mass, no hepatomegaly, no splenomegaly EXT:  2 plus pulses throughout, no edema, no cyanosis no clubbing SKIN:  No rashes no nodules NEURO:  Cranial nerves II through XII grossly intact, motor grossly intact throughout PSYCH:  Cognitively intact, oriented to person place and time   EKG:  EKG is not ordered today. The ekg ordered 05/22/16 demonstrates atrial fibrillation rate 68 bpm.   09/28/16: Atrial fibrillation rate 84 bpm.  Non-specific ST changes.  Qt prolongation.    Echo 07/20/16: Study Conclusions  - Left ventricle: The cavity size was normal. Wall thickness was   increased in a pattern of mild LVH. Systolic function was normal.   The estimated ejection fraction was in the range of 50% to 55%.   There is akinesis of the basalinferior myocardium. There is   akinesis of the inferolateral myocardium. Features are consistent   with a pseudonormal left ventricular filling pattern, with   concomitant abnormal relaxation and increased filling pressure   (grade 2 diastolic dysfunction). Doppler parameters are   consistent with high ventricular filling pressure. - Left atrium: The atrium was mildly dilated. - Right atrium: The atrium was mildly dilated.  Impressions:  - Akinesis of the basal inferior wall and inferior lateral wall;   overall low normal LV systolic function; grade 2 diastolic   dysfunction with elevated LV filling pressure; mild biatrial   enlargment; trace MR and TR.  LHC 10/03/16: Ost Cx to Dist Cx lesion, 65 %stenosed.  2nd Mrg lesion, 75 %stenosed.  Dist Cx lesion, 80 %stenosed.  Dist LAD lesion, 90 %stenosed.  Mid LAD lesion, 80 %stenosed.  Prox LAD to Mid LAD lesion, 70 %stenosed.   1st Diag lesion, 75 %stenosed.  1st RPLB lesion, 100 %stenosed.  Prox RCA to Dist RCA lesion, 100 %stenosed.  LV end diastolic pressure is mildly elevated.    Severe diffuse three-vessel coronary disease in this patient with multiple prior stents.  Total  occlusion of the right coronary within the ostial segment. Distal vessel fills by collaterals from the left coronary. The distal right coronary is small and may not be graftable.  Severe diffuse LAD disease with 70% proximal stenosis, 80% mid stenosis, and 90% apical stenosis. The first diagonal which is relatively small contains segmental 70-80% stenosis.  Heavily stented proximal to mid circumflex coronary artery with 50-70% in-stent restenosis, diffuse. Large second obtuse marginal with 75% proximal diffuse narrowing. 70-80% segmental narrowing in the mid circumflex after the origin of the second obtuse marginal.  Mildly elevated LV EDP. Recent echo demonstrating EF greater than 50%. Contrast LV gram not performed due to chronic kidney disease. Total contrast used was 80 cc.  Recent Labs: 03/19/2016: B Natriuretic Peptide 385.9 07/19/2016: Magnesium 2.1; TSH 1.209 10/31/2016: ALT 28 11/02/2016: Hemoglobin 11.4; Platelets 243 11/03/2016: BUN 31; Creatinine, Ser 1.37; Potassium 4.0; Sodium 137    Lipid Panel    Component Value Date/Time   CHOL 123 03/22/2016 0320   TRIG 154 (H) 03/22/2016 0320   HDL 25 (L) 03/22/2016 0320   CHOLHDL 4.9 03/22/2016 0320   VLDL 31 03/22/2016 0320   LDLCALC 67 03/22/2016 0320      Wt Readings from Last 3 Encounters:  12/19/16 94.3 kg (208 lb)  12/12/16 95.8 kg (211 lb 3.2 oz)  12/03/16 91.6 kg (202 lb)      ASSESSMENT AND PLAN:  # CAD s/p PCI: # CCS Class III angina:  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  His most recent cath revealed three vessel CAD.  He was referred to Dr. [name] for CABG, but is doing very well now with medical management.  He has done well with home PT and is eager to start cardiac rehab.  We will happily refer him for this program.  Continue medical management for now and plan for surgery only if he worsens clinically.  Continue carvedilol, ranolazine, Zetia and fenofibrate.  He is not on a statin due to  intolertnce.  LDL is <70, so he wouldn't qualify for PCSK9.  He doesn't have much room for optimization of medication due to hypotension.  He is on Eliquis, so no aspirin r P2Y12 inhibitor.  # Chronic systolic and diastolic heart failure:   LVEF 50-55%.  Continue carvedilol and Lasix.   # Hypertension/hypotension: BP well-controlled.  Continue carvedilol.  # Atrial fibrillation: He remains in atrial fibrillation after cardioversion but rates are well-controlled.  Continue Eliquis and carvedilol.  his patients CHA2DS2-VASc Score and unadjusted Ischemic Stroke Rate (% per year) is equal to 7.2 % stroke rate/year from a score of 5  Above score calculated as 1 point each if present [CHF, HTN, DM, Vascular=MI/PAD/Aortic Plaque, Age if 65-74, or Male] Above score calculated as 2 points each if present [Age > 75, or Stroke/TIA/TE]    Current medicines are reviewed at length with the patient today.  The patient does not have concerns regarding medicines.  The following changes have been made:  Add Ranexa  Labs/ tests ordered today include:   No orders of the defined types were placed in this encounter.    Disposition:   FU with Meigan Pates C. [name], MD, [facility] in 3 months   This note was written with the assistance of speech recognition software.  Please excuse any transcriptional errors.  Signed, Journii Nierman C. [name], MD, [facility]  02/28/2017 8:19 AM    [facility]

## 2017-03-05 ENCOUNTER — Encounter: Payer: Self-pay | Admitting: Cardiovascular Disease

## 2017-03-05 ENCOUNTER — Ambulatory Visit (INDEPENDENT_AMBULATORY_CARE_PROVIDER_SITE_OTHER): Payer: Medicare Other | Admitting: Cardiovascular Disease

## 2017-03-05 VITALS — BP 117/77 | HR 96 | Ht 71.0 in | Wt 218.4 lb

## 2017-03-05 DIAGNOSIS — I481 Persistent atrial fibrillation: Secondary | ICD-10-CM

## 2017-03-05 DIAGNOSIS — I110 Hypertensive heart disease with heart failure: Secondary | ICD-10-CM

## 2017-03-05 DIAGNOSIS — I4819 Other persistent atrial fibrillation: Secondary | ICD-10-CM

## 2017-03-05 DIAGNOSIS — I5043 Acute on chronic combined systolic (congestive) and diastolic (congestive) heart failure: Secondary | ICD-10-CM | POA: Diagnosis not present

## 2017-03-05 MED ORDER — FUROSEMIDE 40 MG PO TABS: 40.0000 mg | ORAL_TABLET | Freq: Two times a day (BID) | ORAL | 1 refills | Status: DC

## 2017-03-05 MED ORDER — POTASSIUM CHLORIDE CRYS ER 20 MEQ PO TBCR: EXTENDED_RELEASE_TABLET | ORAL | 1 refills | Status: DC

## 2017-03-05 NOTE — Patient Instructions (Addendum)
Medication Instructions:  INCREASE YOUR POTASSIUM TWO 20 MEQ TABLETS DAILY   INCREASE YOUR FUROSEMIDE TO 40 MG TWICE A DAY   Labwork: NONE  Testing/Procedures: NONE  Follow-Up: Your physician recommends that you schedule a follow-up appointment in: [place]  Your physician recommends that you schedule a follow-up appointment in: [place]   If you need a refill on your cardiac medications before your next appointment, please call your pharmacy.

## 2017-03-05 NOTE — Progress Notes (Signed)
Cardiology Office Note   Date:  03/05/2017   ID:  Julian Ross, DOB 1946/01/20, MRN [id number]  PCP:  Kandice Hams, MD  Cardiologist:   Skeet Latch, MD   Chief Complaint  Patient presents with  . Follow-up     History of Present Illness: Julian Ross is a 71 y.o. male with chronic systolic and diastolic heart failure LVEF improved from 30-35% to 50-55%, paroxysmal atrial fibrillation, CAD s/p multiple PCIs, who presents for follow up.  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  During that hospitalization he also had elevated troponin consistent with demand ischemia (troponin 0.13) and new onset atrial fibrillation with RVR.  He was diuresed to a discharge weight of 196 lb (from 208 lb on admit).  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  He previously had 16 stents placed in [place], [place].  His cardiologist there is [name].  The inpatient team contacted [name], who informed them that Mr. Hidrogo has a tight RCA lesion but no recent interventions.  He felt that Mr. Lenker could transition from Plavix to aspirin.  He has been intolerant to statins in the past.    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  After cardioversion he developed bradycardia and initially metoprolol was reduced.  Then he was switched from metoprolol to carvedilol.  This was further reduced to 6.25mg  bid on 06/19/16.  On 7/21 he had a recurrent episode of syncope. Hydralazine and isosorbide were discontinued.  Troponin was mildly elevated at 0.03.  CT of the head was negative, and chest x-ray showed no acute edema or infiltrate.  During that hospitalization he was noted to be orthostatic.  His SBP dropped to 80 when standing.  Carvedilol was reduced 1.5625 mg bid and his orthostasis was improved.  A 30 day event monitor was placed and it was recommended that outpatient Myoview be considered.  Echo  that admission showed an improvement in his LVEF to 50-55%. He wore an event monitor 07/31/16 that revealed short runs of atrial fibrillation as well as PVCs and PACs. He reported angina and underwent LHC  10/4/17where he was noted to have severe, diffuse, three-vessel coronary disease with multiple areas of prior stenting. A recommendation was made for intensification of medical therapy or consideration of coronary artery bypass grafting. He was scheduled for CABG/MAZE but developed pneumonia requiring hospitalization 10/2016.  After following up for that hospitalization he was feeling well. He hasn't had any chest pain since starting Ranexa.  He denies palpitations, lightheadedness, or dizziness. He also reports feeling as though his chest is congested and he can't take a deep breath. He denies fevers or chills and has not noted any productive cough. He has mild lower extremity edema worse on the left than the right lower extremity. Typically improves with elevation of his legs.   Past Medical History:  Diagnosis Date  . Anemia   . Anxiety state   . CAD (coronary artery disease)    16 prior stents (at [facility])  . Cardiomyopathy, ischemic   . Chronic lower back pain   . Chronic systolic CHF (congestive heart failure) ([place])   . CKD (chronic kidney disease), stage III   . Depression   . Diabetes mellitus type 2 in obese ([place])   . Fibromyalgia   . Hepatitis A    at age 65  . High cholesterol   . History of  blood transfusion    "related to OR"  . Hypertension   . Hypokalemia   . MI (myocardial infarction) 1990; 1995; 1997  . OSA on CPAP   . Osteoarthritis   . Persistent atrial fibrillation ([place]) 02/2016   occured in setting of pneumonia  . Rheumatoid arthritis of multiple sites with negative rheumatoid factor ([place])   . Septic shock ([place]) 02/2016   in setting of severe pneumonia    Past Surgical History:  Procedure Laterality Date  . BACK SURGERY    . CARDIAC CATHETERIZATION  N/A 10/03/2016   Procedure: Left Heart Cath and Coronary Angiography;  Surgeon: Belva Crome, MD;  Location: [facility];  Service: Cardiovascular;  Laterality: N/A;  . CARDIOVERSION N/A 06/04/2016   Procedure: CARDIOVERSION;  Surgeon: Skeet Latch, MD;  Location: [facility];  Service: Cardiovascular;  Laterality: N/A;  . CATARACT EXTRACTION W/ INTRAOCULAR LENS  IMPLANT, BILATERAL Bilateral 2000s  . CORONARY ANGIOPLASTY  early 25s  . CORONARY ANGIOPLASTY WITH STENT PLACEMENT     "I've got 15 stents" (07/19/2016)  . KNEE ARTHROSCOPY Left 1990s  . POSTERIOR LUMBAR FUSION  2015   L4-5-S1  . right knee surgery     age of 52  . TONSILLECTOMY AND ADENOIDECTOMY  1949     Current Outpatient Prescriptions  Medication Sig Dispense Refill  . acetaminophen (TYLENOL) 325 MG tablet Take 650 mg by mouth every 4 (four) hours as needed for mild pain or headache.    . ALPRAZolam (NIRAVAM) 0.5 MG dissolvable tablet Take 1 tablet by mouth as needed (Take as directed).   2  . apixaban (ELIQUIS) 5 MG TABS tablet Take 1 tablet (5 mg total) by mouth 2 (two) times daily. 60 tablet   . carvedilol (COREG) 6.25 MG tablet Take 1 tablet (6.25 mg total) by mouth 2 (two) times daily with a meal. 180 tablet 3  . clotrimazole (LOTRIMIN) 1 % cream Apply 1 application topically 2 (two) times daily. 30 g 0  . fenofibrate (TRICOR) 145 MG tablet Take 145 mg by mouth daily.    . folic acid (FOLVITE) 1 MG tablet Take 1 mg by mouth daily.    . furosemide (LASIX) 40 MG tablet Take 1 tablet (40 mg total) by mouth 2 (two) times daily. 180 tablet 1  . gabapentin (NEURONTIN) 300 MG capsule Take 300 mg by mouth at bedtime.   2  . hydrocortisone (ANUSOL-HC) 2.5 % rectal cream Place 1 application rectally 2 (two) times daily as needed for hemorrhoids or itching. 30 g 0  . insulin lispro protamine-lispro (HUMALOG 50/50 MIX) (50-50) 100 UNIT/ML SUSP injection Inject 40-50 Units into the skin See admin instructions. Takes 50  units in am, 40 units midday, 50 units in pm    . leucovorin (WELLCOVORIN) 10 MG tablet Take 10 mg by mouth See admin instructions. Take 1 tablet (10 mg) by mouth every 12 hours and 24 hours after the methotrexate dose    . LUMIGAN 0.01 % SOLN Place 1 drop into both eyes at bedtime.   0  . magnesium oxide (MAG-OX) 400 (241.3 Mg) MG tablet Take 1 tablet (400 mg total) by mouth 2 (two) times daily. (Patient taking differently: Take 400 mg by mouth daily. ) 60 tablet 0  . Methotrexate Sodium (METHOTREXATE, PF,) 50 MG/2ML injection Inject 15 mg into the muscle every Friday.   0  . Misc Natural Products (OSTEO BI-FLEX ADV DOUBLE ST PO) Take 1 tablet by mouth daily as needed (takes  occassionally).     Marland Kitchen omega-3 acid ethyl esters (LOVAZA) 1 g capsule Take 1 capsule by mouth 2 (two) times daily.  11  . omeprazole (PRILOSEC) 20 MG capsule Take 20 mg by mouth daily as needed (heartburn or acid reflux).    Marland Kitchen PARoxetine (PAXIL) 40 MG tablet Take 40 mg by mouth daily.   11  . potassium chloride SA (K-DUR,KLOR-CON) 20 MEQ tablet 2 BY MOUTH DAILY 180 tablet 1  . predniSONE (DELTASONE) 5 MG tablet Take 5 mg by mouth daily with breakfast.    . ranolazine (RANEXA) 500 MG 12 hr tablet Take 1 tablet (500 mg total) by mouth 2 (two) times daily. 180 tablet 1  . tamsulosin (FLOMAX) 0.4 MG CAPS capsule Take 0.4 mg by mouth daily.   4  . tiZANidine (ZANAFLEX) 4 MG tablet Take 1 tablet by mouth every 8 (eight) hours as needed for muscle spasms.   2  . topiramate (TOPAMAX) 25 MG tablet Take 25 mg by mouth 2 (two) times daily.    . traMADol (ULTRAM) 50 MG tablet Take 1 tablet by mouth 3 (three) times daily as needed (pain). Take as directed  2  . VITAMIN D, ERGOCALCIFEROL, PO Take 5,000 Units by mouth daily.     Marland Kitchen ZETIA 10 MG tablet Take 10 mg by mouth daily.   11   No current facility-administered medications for this visit.     Allergies:   Statins; Ace inhibitors; Atenolol; Contrast media [iodinated diagnostic agents];  and Penicillins    Social History:  The patient  reports that he has never smoked. He has never used smokeless tobacco. He reports that he does not drink alcohol or use drugs.   Family History:  The patient's family history includes Heart attack in his brother and father; Heart failure in his mother; Hyperlipidemia in his mother; Hypertension in his mother.    ROS:  Please see the history of present illness.   Otherwise, review of systems are positive for none.   All other systems are reviewed and negative.    PHYSICAL EXAM: VS:  BP 117/77   Pulse 96   Ht 5\' 11"  (1.803 m)   Wt 99.1 kg (218 lb 6.4 oz)   BMI 30.46 kg/m  , BMI Body mass index is 30.46 kg/m. GENERAL:  Well appearing HEENT:  Pupils equal round and reactive, fundi not visualized, oral mucosa unremarkable NECK:  JVP 2 cm above clavicle at 45 degrees.  waveform within normal limits, carotid upstroke brisk and symmetric, no bruits LYMPHATICS:  No cervical adenopathy LUNGS:  Bibasilar crackles HEART:  Irregularly irregular.  PMI not displaced or sustained,S1 and S2 within normal limits, no S3, no S4, no clicks, no rubs, no murmurs ABD:  Flat, positive bowel sounds normal in frequency in pitch, no bruits, no rebound, no guarding, no midline pulsatile mass, no hepatomegaly, no splenomegaly EXT:  2 plus pulses throughout, no edema, no cyanosis no clubbing SKIN:  No rashes no nodules NEURO:  Cranial nerves II through XII grossly intact, motor grossly intact throughout PSYCH:  Cognitively intact, oriented to person place and time   EKG:  EKG is not ordered today. The ekg ordered 05/22/16 demonstrates atrial fibrillation rate 68 bpm.   09/28/16: Atrial fibrillation rate 84 bpm.  Non-specific ST changes.  Qt prolongation.    Echo 07/20/16: Study Conclusions  - Left ventricle: The cavity size was normal. Wall thickness was   increased in a pattern of mild LVH. Systolic function was  normal.   The estimated ejection fraction was  in the range of 50% to 55%.   There is akinesis of the basalinferior myocardium. There is   akinesis of the inferolateral myocardium. Features are consistent   with a pseudonormal left ventricular filling pattern, with   concomitant abnormal relaxation and increased filling pressure   (grade 2 diastolic dysfunction). Doppler parameters are   consistent with high ventricular filling pressure. - Left atrium: The atrium was mildly dilated. - Right atrium: The atrium was mildly dilated.  Impressions:  - Akinesis of the basal inferior wall and inferior lateral wall;   overall low normal LV systolic function; grade 2 diastolic   dysfunction with elevated LV filling pressure; mild biatrial   enlargment; trace MR and TR.  LHC 10/03/16: Ost Cx to Dist Cx lesion, 65 %stenosed.  2nd Mrg lesion, 75 %stenosed.  Dist Cx lesion, 80 %stenosed.  Dist LAD lesion, 90 %stenosed.  Mid LAD lesion, 80 %stenosed.  Prox LAD to Mid LAD lesion, 70 %stenosed.   1st Diag lesion, 75 %stenosed.  1st RPLB lesion, 100 %stenosed.  Prox RCA to Dist RCA lesion, 100 %stenosed.  LV end diastolic pressure is mildly elevated.    Severe diffuse three-vessel coronary disease in this patient with multiple prior stents.  Total occlusion of the right coronary within the ostial segment. Distal vessel fills by collaterals from the left coronary. The distal right coronary is small and may not be graftable.  Severe diffuse LAD disease with 70% proximal stenosis, 80% mid stenosis, and 90% apical stenosis. The first diagonal which is relatively small contains segmental 70-80% stenosis.  Heavily stented proximal to mid circumflex coronary artery with 50-70% in-stent restenosis, diffuse. Large second obtuse marginal with 75% proximal diffuse narrowing. 70-80% segmental narrowing in the mid circumflex after the origin of the second obtuse marginal.  Mildly elevated LV EDP. Recent echo demonstrating EF greater than 50%.  Contrast LV gram not performed due to chronic kidney disease. Total contrast used was 80 cc.  Recent Labs: 03/19/2016: B Natriuretic Peptide 385.9 07/19/2016: Magnesium 2.1; TSH 1.209 10/31/2016: ALT 28 11/02/2016: Hemoglobin 11.4; Platelets 243 11/03/2016: BUN 31; Creatinine, Ser 1.37; Potassium 4.0; Sodium 137    Lipid Panel    Component Value Date/Time   CHOL 123 03/22/2016 0320   TRIG 154 (H) 03/22/2016 0320   HDL 25 (L) 03/22/2016 0320   CHOLHDL 4.9 03/22/2016 0320   VLDL 31 03/22/2016 0320   LDLCALC 67 03/22/2016 0320      Wt Readings from Last 3 Encounters:  03/05/17 99.1 kg (218 lb 6.4 oz)  12/19/16 94.3 kg (208 lb)  12/12/16 95.8 kg (211 lb 3.2 oz)      ASSESSMENT AND PLAN:  # Acute on chronic systolic and diastolic heart failure:   LVEF improved to 50-55%.  He reports shortness of breath and appears to be volume overloaded on exam. We will increase his Lasix to 40 mg twice daily. We'll also increase potassium to 40 mEq daily. He will need a basic metabolic panel checked at follow-up. Continue carvedilol.   Consider adding an ACE inhibitor/ARB if renal function and blood pressure permits in the future.  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  His most recent cath revealed three vessel CAD.  He was referred to Dr. [name] for CABG, but continues to do very well now with medical management. Plan for surgery only if he worsens  clinically.  Continue carvedilol, ranolazine, Zetia and fenofibrate.  He is not on a statin due to intolertnce.  LDL is <70, so he wouldn't qualify for PCSK9.  He doesn't have much room for optimization of medication due to hypotension.  He is on Eliquis, so no aspirin r P2Y12 inhibitor.  # Hypertension/hypotension: BP well-controlled.  Continue carvedilol.  # Atrial fibrillation: He remains in atrial fibrillation after cardioversion but rates are well-controlled.  Continue Eliquis and carvedilol.   his patients CHA2DS2-VASc Score and unadjusted Ischemic Stroke Rate (% per year) is equal to 7.2 % stroke rate/year from a score of 5  Above score calculated as 1 point each if present [CHF, HTN, DM, Vascular=MI/PAD/Aortic Plaque, Age if 65-74, or Male] Above score calculated as 2 points each if present [Age > 75, or Stroke/TIA/TE]    Current medicines are reviewed at length with the patient today.  The patient does not have concerns regarding medicines.  The following changes have been made:  Increase Lasix to 40 mg twice a day. Increase potassium to 40 mEq daily.  Labs/ tests ordered today include:   No orders of the defined types were placed in this encounter.    Disposition:   FU with Starlett Pehrson C. [name], MD, [facility] in 1 month.  APP in 1 week.   This note was written with the assistance of speech recognition software.  Please excuse any transcriptional errors.  Signed, Oswaldo Cueto C. [name], MD, [facility]  03/05/2017 5:48 PM    [facility]

## 2017-03-07 ENCOUNTER — Other Ambulatory Visit: Payer: Self-pay | Admitting: Thoracic Surgery (Cardiothoracic Vascular Surgery)

## 2017-03-07 DIAGNOSIS — I25119 Atherosclerotic heart disease of native coronary artery with unspecified angina pectoris: Secondary | ICD-10-CM

## 2017-03-11 ENCOUNTER — Ambulatory Visit (INDEPENDENT_AMBULATORY_CARE_PROVIDER_SITE_OTHER): Payer: Medicare Other | Admitting: Thoracic Surgery (Cardiothoracic Vascular Surgery)

## 2017-03-11 ENCOUNTER — Encounter: Payer: Self-pay | Admitting: Thoracic Surgery (Cardiothoracic Vascular Surgery)

## 2017-03-11 ENCOUNTER — Ambulatory Visit
Admission: RE | Admit: 2017-03-11 | Discharge: 2017-03-11 | Disposition: A | Discharge status: Home / Self Care | Payer: Medicare Other | Source: Ambulatory Visit | Attending: Thoracic Surgery (Cardiothoracic Vascular Surgery) | Admitting: Thoracic Surgery (Cardiothoracic Vascular Surgery)

## 2017-03-11 VITALS — BP 118/82 | HR 100 | Resp 20 | Ht 71.0 in | Wt 218.0 lb

## 2017-03-11 DIAGNOSIS — I209 Angina pectoris, unspecified: Secondary | ICD-10-CM

## 2017-03-11 DIAGNOSIS — I25119 Atherosclerotic heart disease of native coronary artery with unspecified angina pectoris: Secondary | ICD-10-CM

## 2017-03-11 DIAGNOSIS — I480 Paroxysmal atrial fibrillation: Secondary | ICD-10-CM

## 2017-03-11 DIAGNOSIS — I25118 Atherosclerotic heart disease of native coronary artery with other forms of angina pectoris: Secondary | ICD-10-CM | POA: Diagnosis not present

## 2017-03-11 DIAGNOSIS — R0602 Shortness of breath: Secondary | ICD-10-CM | POA: Diagnosis not present

## 2017-03-11 NOTE — Progress Notes (Signed)
[address]       [place] [address]             [phone]     CARDIOTHORACIC SURGERY OFFICE NOTE  Referring Provider is Skeet Latch, MD PCP is Kandice Hams, MD   HPI:  Patient is a 71 year old male with multiple medical problems including three-vessel coronary artery disease with ischemic cardiomyopathy, chronic combined systolic and diastolic congestive heart failure, recurrent paroxysmal atrial fibrillation, and obstructive sleep apnea on home CPAP who returns to the office today for follow-up. He was originally referred to discuss the potential risks and benefits of elective coronary artery bypass grafting with or without Maze procedure. Ultimately a decision was made to continue to follow him on medical therapy. He was last seen in follow-up in our office on 12/03/2016. Since then he has been followed carefully by Dr. [name]. He has been doing well on medical therapy.  He was seen in follow-up recently by Dr. [name] and his dose of Lasix was increased. He has not been having angina and he reports that overall he seems to be getting along fairly well. He states that his breathing primarily only becomes a problem at night when he has problems with his CPAP machine, particularly if it runs out of humidification. He states that he does not have much trouble with exertional shortness of breath or chest discomfort. He is reasonably active physically and he reports no significant limitations.   Current Outpatient Prescriptions  Medication Sig Dispense Refill  . acetaminophen (TYLENOL) 325 MG tablet Take 650 mg by mouth every 4 (four) hours as needed for mild pain or headache.    . ALPRAZolam (NIRAVAM) 0.5 MG dissolvable tablet Take 1 tablet by mouth as needed (Take as directed).   2  . apixaban (ELIQUIS) 5 MG TABS tablet Take 1 tablet (5 mg total) by mouth 2 (two) times daily. 60 tablet   . carvedilol (COREG) 6.25 MG tablet Take 1 tablet (6.25 mg total) by  mouth 2 (two) times daily with a meal. 180 tablet 3  . clotrimazole (LOTRIMIN) 1 % cream Apply 1 application topically 2 (two) times daily. 30 g 0  . fenofibrate (TRICOR) 145 MG tablet Take 145 mg by mouth daily.    . folic acid (FOLVITE) 1 MG tablet Take 1 mg by mouth daily.    . furosemide (LASIX) 40 MG tablet Take 1 tablet (40 mg total) by mouth 2 (two) times daily. 180 tablet 1  . gabapentin (NEURONTIN) 300 MG capsule Take 300 mg by mouth at bedtime.   2  . hydrocortisone (ANUSOL-HC) 2.5 % rectal cream Place 1 application rectally 2 (two) times daily as needed for hemorrhoids or itching. 30 g 0  . insulin lispro protamine-lispro (HUMALOG 50/50 MIX) (50-50) 100 UNIT/ML SUSP injection Inject 40-50 Units into the skin See admin instructions. Takes 50 units in am, 40 units midday, 50 units in pm    . leucovorin (WELLCOVORIN) 10 MG tablet Take 10 mg by mouth See admin instructions. Take 1 tablet (10 mg) by mouth every 12 hours and 24 hours after the methotrexate dose    . LUMIGAN 0.01 % SOLN Place 1 drop into both eyes at bedtime.   0  . magnesium oxide (MAG-OX) 400 (241.3 Mg) MG tablet Take 1 tablet (400 mg total) by mouth 2 (two) times daily. (Patient taking differently: Take 400 mg by mouth daily. ) 60 tablet 0  . Methotrexate Sodium (METHOTREXATE, PF,) 50  MG/2ML injection Inject 15 mg into the muscle every Friday.   0  . Misc Natural Products (OSTEO BI-FLEX ADV DOUBLE ST PO) Take 1 tablet by mouth daily as needed (takes occassionally).     Marland Kitchen omega-3 acid ethyl esters (LOVAZA) 1 g capsule Take 1 capsule by mouth 2 (two) times daily.  11  . omeprazole (PRILOSEC) 20 MG capsule Take 20 mg by mouth daily as needed (heartburn or acid reflux).    Marland Kitchen PARoxetine (PAXIL) 40 MG tablet Take 40 mg by mouth daily.   11  . potassium chloride SA (K-DUR,KLOR-CON) 20 MEQ tablet 2 BY MOUTH DAILY 180 tablet 1  . predniSONE (DELTASONE) 5 MG tablet Take 5 mg by mouth daily with breakfast.    . ranolazine (RANEXA) 500  MG 12 hr tablet Take 1 tablet (500 mg total) by mouth 2 (two) times daily. 180 tablet 1  . tamsulosin (FLOMAX) 0.4 MG CAPS capsule Take 0.4 mg by mouth daily.   4  . tiZANidine (ZANAFLEX) 4 MG tablet Take 1 tablet by mouth every 8 (eight) hours as needed for muscle spasms.   2  . topiramate (TOPAMAX) 25 MG tablet Take 25 mg by mouth 2 (two) times daily.    . traMADol (ULTRAM) 50 MG tablet Take 1 tablet by mouth 3 (three) times daily as needed (pain). Take as directed  2  . VITAMIN D, ERGOCALCIFEROL, PO Take 5,000 Units by mouth daily.     Marland Kitchen ZETIA 10 MG tablet Take 10 mg by mouth daily.   11   No current facility-administered medications for this visit.       Physical Exam:   BP 118/82   Pulse 100   Resp 20   Ht 5\' 11"  (1.803 m)   Wt 218 lb (98.9 kg)   SpO2 97%   BMI 30.40 kg/m   General:  Elderly but well-appearing  Chest:   Clear to auscultation  CV:   Irregular rate and rhythm  Incisions:  n/a  Abdomen:  Soft nontender  Extremities:  Warm and well-perfused  Diagnostic Tests:  CHEST  2 VIEW  COMPARISON:  PA and lateral chest 07/19/2016, 12/03/2016 and 01/11/2017.  FINDINGS: Streaky airspace opacity in the left upper lobe persists without notable change. The right lung is clear. Heart size is normal. No pneumothorax or pleural effusion. Aortic atherosclerosis is noted.  IMPRESSION: No marked change in streaky left upper lobe airspace disease which may be due to scarring related to prior pneumonia. CT chest with contrast could be used for better clarification.   Electronically Signed   By: Inge Rise M.D.   On: 03/11/2017 12:32    Impression:  Patient remains clinically stable with well-controlled symptoms of exertional chest discomfort and shortness of breath on long-term medical therapy.  Plan:  We have not recommended any changes to the patient's current medications. The patient will continue to follow-up closely with Dr. [name] and call to  return to our office only should he develop worsening symptoms of angina pectoris refractory to medical therapy. He will otherwise return to our office in one year to make sure that he continues to do fairly well.    I spent in excess of 15 minutes during the conduct of this office consultation and >50% of this time involved direct face-to-face encounter with the patient for counseling and/or coordination of their care.    Valentina Gu. [name], MD 03/11/2017 12:35 PM

## 2017-03-11 NOTE — Patient Instructions (Signed)
Continue all previous medications without any changes at this time  

## 2017-03-15 ENCOUNTER — Ambulatory Visit: Payer: Medicare Other | Admitting: Cardiovascular Disease

## 2017-03-15 DIAGNOSIS — Z794 Long term (current) use of insulin: Secondary | ICD-10-CM | POA: Diagnosis not present

## 2017-03-15 DIAGNOSIS — F419 Anxiety disorder, unspecified: Secondary | ICD-10-CM | POA: Diagnosis not present

## 2017-03-15 DIAGNOSIS — F339 Major depressive disorder, recurrent, unspecified: Secondary | ICD-10-CM | POA: Diagnosis not present

## 2017-03-15 DIAGNOSIS — E7800 Pure hypercholesterolemia, unspecified: Secondary | ICD-10-CM | POA: Diagnosis not present

## 2017-03-15 DIAGNOSIS — I509 Heart failure, unspecified: Secondary | ICD-10-CM | POA: Diagnosis not present

## 2017-03-15 DIAGNOSIS — I4891 Unspecified atrial fibrillation: Secondary | ICD-10-CM | POA: Diagnosis not present

## 2017-03-15 DIAGNOSIS — M059 Rheumatoid arthritis with rheumatoid factor, unspecified: Secondary | ICD-10-CM | POA: Diagnosis not present

## 2017-03-15 DIAGNOSIS — G4730 Sleep apnea, unspecified: Secondary | ICD-10-CM | POA: Diagnosis not present

## 2017-03-15 DIAGNOSIS — E1340 Other specified diabetes mellitus with diabetic neuropathy, unspecified: Secondary | ICD-10-CM | POA: Diagnosis not present

## 2017-03-15 DIAGNOSIS — E1122 Type 2 diabetes mellitus with diabetic chronic kidney disease: Secondary | ICD-10-CM | POA: Diagnosis not present

## 2017-03-15 DIAGNOSIS — I2510 Atherosclerotic heart disease of native coronary artery without angina pectoris: Secondary | ICD-10-CM | POA: Diagnosis not present

## 2017-03-15 DIAGNOSIS — I10 Essential (primary) hypertension: Secondary | ICD-10-CM | POA: Diagnosis not present

## 2017-03-21 ENCOUNTER — Ambulatory Visit (INDEPENDENT_AMBULATORY_CARE_PROVIDER_SITE_OTHER): Payer: Medicare Other | Admitting: Cardiology

## 2017-03-21 ENCOUNTER — Encounter: Payer: Self-pay | Admitting: Cardiology

## 2017-03-21 VITALS — BP 130/76 | HR 93 | Ht 71.0 in | Wt 221.2 lb

## 2017-03-21 DIAGNOSIS — I25118 Atherosclerotic heart disease of native coronary artery with other forms of angina pectoris: Secondary | ICD-10-CM

## 2017-03-21 DIAGNOSIS — Z7901 Long term (current) use of anticoagulants: Secondary | ICD-10-CM | POA: Diagnosis not present

## 2017-03-21 DIAGNOSIS — N183 Chronic kidney disease, stage 3 unspecified: Secondary | ICD-10-CM

## 2017-03-21 DIAGNOSIS — Z79899 Other long term (current) drug therapy: Secondary | ICD-10-CM

## 2017-03-21 DIAGNOSIS — E785 Hyperlipidemia, unspecified: Secondary | ICD-10-CM

## 2017-03-21 DIAGNOSIS — Z9989 Dependence on other enabling machines and devices: Secondary | ICD-10-CM

## 2017-03-21 DIAGNOSIS — I209 Angina pectoris, unspecified: Secondary | ICD-10-CM

## 2017-03-21 DIAGNOSIS — G4733 Obstructive sleep apnea (adult) (pediatric): Secondary | ICD-10-CM | POA: Diagnosis not present

## 2017-03-21 DIAGNOSIS — I25119 Atherosclerotic heart disease of native coronary artery with unspecified angina pectoris: Secondary | ICD-10-CM

## 2017-03-21 DIAGNOSIS — E1169 Type 2 diabetes mellitus with other specified complication: Secondary | ICD-10-CM | POA: Diagnosis not present

## 2017-03-21 DIAGNOSIS — I10 Essential (primary) hypertension: Secondary | ICD-10-CM

## 2017-03-21 DIAGNOSIS — I480 Paroxysmal atrial fibrillation: Secondary | ICD-10-CM

## 2017-03-21 NOTE — Patient Instructions (Addendum)
Medication Instructions: Kerin Ransom, PA-C, recommends that you continue on your current medications as directed. Please refer to the Current Medication list given to you today.  Labwork: Your physician recommends that you return for lab work at your earliest [place].  Testing/Procedures: NONE ORDERED  Follow-up: Lurena Joiner recommends that you schedule a follow-up appointment in 3 months with Dr [name].  If you need a refill on your cardiac medications before your next appointment, please call your pharmacy.

## 2017-03-21 NOTE — Assessment & Plan Note (Signed)
Now appears to be permanent asymptomatic AF

## 2017-03-21 NOTE — Assessment & Plan Note (Signed)
Some issues with his mask but overall complaint

## 2017-03-21 NOTE — Assessment & Plan Note (Signed)
CHADs VASc=5. On Eliquis 5 mg BID 

## 2017-03-21 NOTE — Assessment & Plan Note (Addendum)
EF 50-55% with LVH and grade 2 DD July 2017- B/P  Controlled

## 2017-03-21 NOTE — Assessment & Plan Note (Signed)
Severe multivessel CAD and multiple prior PCIs [facility]).  Cath 10/03/16-referred to Dr Milagros Loll for consideration of CABG. Pt currently stable on medical Rx.

## 2017-03-21 NOTE — Progress Notes (Signed)
03/21/2017 Julian Ross   06-26-1946  [id number]  Primary Physician Kandice Hams, MD Primary Cardiologist: Dr [name]  HPI:  71 y.o. male with chronic systolic and diastolic heart failure LVEF improved from 30-35% to 50-55%, PAF, and CAD s/p multiple PCIs [facility]).  Julian Ross was admitted to the hospital 03/18/16-04/02/16 with sepsis, hypoxic respiratory failure, metabolic encephalopathy and acute on chronic heart failure.  During that hospitalization Julian Ross also had elevated troponin consistent with demand ischemia (troponin 0.13) and new onset atrial fibrillation with RVR.  Julian Ross was diuresed to a discharge weight of 196 lb (from 208 lb on admit).   Julian Ross underwent DCCV on 06/04/16.  After cardioversion Julian Ross developed bradycardia and hypotension and  his medications were adjusted. Echo that admission showed an improvement in his LVEF to 50-55%. Julian Ross wore an event monitor 07/31/16 that revealed short runs of atrial fibrillation as well as PVCs and PACs. Julian Ross was seen by [name] for management of his atrial fibrillation. Julian Ross was felt to be a poor candidate for ablation.  Amiodarone and dofetilide would be options, but it was unclear whether atrial fibrillation was actually causing symptoms.    The pt had a cath in Oct 2017 that revealed diffuse, severe, CAD. Dr. [name] saw the pt and Julian Ross was actually scheduled to have CABG/MAZE,   however, Julian Ross then developed pneumonia requiring hospitalization.  Julian Ross required hospitalization in the ICU and was discharged with home PT.  Julian Ross has followed up with Dr. [name] and Julian Ross has been doing well, so a decision was made to defer his surgery.  Since his hospitalization Julian Ross has been doing much better.  Julian Ross is in the office today for follow up. Julian Ross denies any chest pain. Julian Ross is unaware of his AF. Julian Ross saw Dr [name] 03/11/17.    Current Outpatient Prescriptions  Medication Sig Dispense Refill  . acetaminophen (TYLENOL) 325 MG tablet Take 650 mg by mouth every 4 (four) hours as  needed for mild pain or headache.    . ALPRAZolam (NIRAVAM) 0.5 MG dissolvable tablet Take 1 tablet by mouth as needed (Take as directed).   2  . apixaban (ELIQUIS) 5 MG TABS tablet Take 1 tablet (5 mg total) by mouth 2 (two) times daily. 60 tablet   . carvedilol (COREG) 6.25 MG tablet Take 1 tablet (6.25 mg total) by mouth 2 (two) times daily with a meal. 180 tablet 3  . clotrimazole (LOTRIMIN) 1 % cream Apply 1 application topically 2 (two) times daily. 30 g 0  . fenofibrate (TRICOR) 145 MG tablet Take 145 mg by mouth daily.    . folic acid (FOLVITE) 1 MG tablet Take 2 mg by mouth daily.     . furosemide (LASIX) 40 MG tablet Take 1 tablet (40 mg total) by mouth 2 (two) times daily. 180 tablet 1  . gabapentin (NEURONTIN) 300 MG capsule Take 300 mg by mouth at bedtime.   2  . hydrocortisone (ANUSOL-HC) 2.5 % rectal cream Place 1 application rectally 2 (two) times daily as needed for hemorrhoids or itching. 30 g 0  . insulin lispro protamine-lispro (HUMALOG 50/50 MIX) (50-50) 100 UNIT/ML SUSP injection Inject 40-50 Units into the skin See admin instructions. Takes 50 units in am, 40 units midday, 50 units in pm    . leucovorin (WELLCOVORIN) 10 MG tablet Take 10 mg by mouth See admin instructions. Take 1 tablet (10 mg) by mouth every 12 hours and 24 hours after the methotrexate dose    .  LUMIGAN 0.01 % SOLN Place 1 drop into both eyes at bedtime.   0  . magnesium oxide (MAG-OX) 400 (241.3 Mg) MG tablet Take 1 tablet (400 mg total) by mouth 2 (two) times daily. (Patient taking differently: Take 400 mg by mouth daily. ) 60 tablet 0  . Methotrexate Sodium (METHOTREXATE, PF,) 50 MG/2ML injection Inject 15 mg into the muscle every Friday.   0  . Misc Natural Products (OSTEO BI-FLEX ADV DOUBLE ST PO) Take 1 tablet by mouth daily as needed (takes occassionally).     Marland Kitchen omega-3 acid ethyl esters (LOVAZA) 1 g capsule Take 1 capsule by mouth 2 (two) times daily.  11  . omeprazole (PRILOSEC) 20 MG capsule Take 20  mg by mouth daily as needed (heartburn or acid reflux).    Marland Kitchen PARoxetine (PAXIL) 40 MG tablet Take 40 mg by mouth daily.   11  . potassium chloride SA (K-DUR,KLOR-CON) 20 MEQ tablet 2 BY MOUTH DAILY 180 tablet 1  . predniSONE (DELTASONE) 5 MG tablet Take 5 mg by mouth daily with breakfast.    . ranolazine (RANEXA) 500 MG 12 hr tablet Take 1 tablet (500 mg total) by mouth 2 (two) times daily. 180 tablet 1  . tamsulosin (FLOMAX) 0.4 MG CAPS capsule Take 0.4 mg by mouth daily.   4  . tiZANidine (ZANAFLEX) 4 MG tablet Take 1 tablet by mouth every 8 (eight) hours as needed for muscle spasms.   2  . topiramate (TOPAMAX) 25 MG tablet Take 25 mg by mouth 2 (two) times daily.    . traMADol (ULTRAM) 50 MG tablet Take 1 tablet by mouth 3 (three) times daily as needed (pain). Take as directed  2  . VITAMIN D, ERGOCALCIFEROL, PO Take 5,000 Units by mouth daily.     Marland Kitchen ZETIA 10 MG tablet Take 10 mg by mouth daily.   11   No current facility-administered medications for this visit.     Allergies  Allergen Reactions  . Statins Other (See Comments)    Causes stiffness in joints   . Ace Inhibitors Cough  . Atenolol Other (See Comments)    Unknown reaction  . Contrast Media [Iodinated Diagnostic Agents] Rash    Has to take benadryl prior to use  . Penicillins Hives and Rash    Has patient had a PCN reaction causing immediate rash, facial/tongue/throat swelling, SOB or lightheadedness with hypotension: Yes Has patient had a PCN reaction causing severe rash involving mucus membranes or skin necrosis: No Has patient had a PCN reaction that required hospitalization pt was in the hospital at time of last reaction - heart attack Has patient had a PCN reaction occurring within the last 10 years: No If all of the above answers are "NO", then may proceed with Cephalosporin use.    Past Medical History:  Diagnosis Date  . Anemia   . Anxiety state   . CAD (coronary artery disease)    16 prior stents (at [facility])  . Cardiomyopathy, ischemic   . Chronic lower back pain   . Chronic systolic CHF (congestive heart failure) ([place])   . CKD (chronic kidney disease), stage III   . Depression   . Diabetes mellitus type 2 in obese ([place])   . Fibromyalgia   . Hepatitis A    at age 55  . High cholesterol   . History of blood transfusion    "related to OR"  . Hypertension   . Hypokalemia   .  MI (myocardial infarction) 1990; 1995; 1997  . OSA on CPAP   . Osteoarthritis   . Persistent atrial fibrillation ([place]) 02/2016   occured in setting of pneumonia  . Rheumatoid arthritis of multiple sites with negative rheumatoid factor ([place])   . Septic shock ([place]) 02/2016   in setting of severe pneumonia    Social History   Social History  . Marital status: Married    Spouse name: N/A  . Number of children: N/A  . Years of education: N/A   Occupational History  . Not on file.   Social History Main Topics  . Smoking status: Never Smoker  . Smokeless tobacco: Never Used  . Alcohol use No  . Drug use: No  . Sexual activity: Not Currently   Other Topics Concern  . Not on file   Social History Narrative   Micheal Likens, Dentist retired on disability.  Lived in [place] but recently moved to [place].     Family History  Problem Relation Age of Onset  . Heart failure Mother   . Hyperlipidemia Mother   . Hypertension Mother   . Heart attack Father   . Heart attack Brother      Review of Systems: General: negative for chills, fever, night sweats or weight changes.  Cardiovascular: negative for chest pain, dyspnea on exertion, edema, orthopnea, palpitations, paroxysmal nocturnal dyspnea or shortness of breath Dermatological: negative for rash Respiratory: negative for cough or wheezing Urologic: negative for hematuria Abdominal: negative for nausea, vomiting, diarrhea, bright red blood per rectum, melena, or hematemesis Neurologic: negative for visual changes, syncope, or dizziness All  other systems reviewed and are otherwise negative except as noted above.    Blood pressure 130/76, pulse 93, height 5\' 11"  (1.803 m), weight 221 lb 3.2 oz (100.3 kg), SpO2 100 %.  General appearance: alert, cooperative and no distress Neck: no carotid bruit and no JVD Lungs: basilar crackles Heart: irregularly irregular rhythm Extremities: no edema Skin: Skin color, texture, turgor normal. No rashes or lesions Neurologic: Grossly normal  EKG AF with VR 74  ASSESSMENT AND PLAN:   Coronary artery disease of native artery of native heart with stable angina pectoris (HCC) Severe multivessel CAD and multiple prior PCIs [facility]).  Cath 10/03/16-referred to Dr Milagros Loll for consideration of CABG. Pt currently stable on medical Rx.  Dyslipidemia associated with type 2 diabetes mellitus (HCC) Statin failure secondary to myalgia  CKD (chronic kidney disease) stage 3, GFR 30-59 ml/min Last SCr 1.37 in Nov 2017  Paroxysmal atrial fibrillation ([place]) Now appears to be permanent asymptomatic AF  OSA on CPAP Some issues with his mask but overall complaint  Chronic anticoagulation CHADs VASc=5. On Eliquis 5 mg BID  Benign essential HTN EF 50-55% with LVH and grade 2 DD July 2017- B/P  Controlled   PLAN  I suggested we check a fasting lipid profile to see if Julian Ross may benefit from a PCSK9 agent. Julian Ross is a stain failure secondary to myalgias and Julian Ross is high risk with multiple past PCIs and know severe multivessel CAD at recent cath.   Kerin Ransom PA-C 03/21/2017 11:43 AM

## 2017-03-21 NOTE — Assessment & Plan Note (Signed)
Statin failure secondary to myalgia

## 2017-03-21 NOTE — Assessment & Plan Note (Signed)
Last SCr 1.37 in Nov 2017

## 2017-03-22 DIAGNOSIS — E1169 Type 2 diabetes mellitus with other specified complication: Secondary | ICD-10-CM | POA: Diagnosis not present

## 2017-03-22 DIAGNOSIS — E785 Hyperlipidemia, unspecified: Secondary | ICD-10-CM | POA: Diagnosis not present

## 2017-03-22 DIAGNOSIS — I480 Paroxysmal atrial fibrillation: Secondary | ICD-10-CM | POA: Diagnosis not present

## 2017-03-22 DIAGNOSIS — Z79899 Other long term (current) drug therapy: Secondary | ICD-10-CM | POA: Diagnosis not present

## 2017-03-22 LAB — COMPREHENSIVE METABOLIC PANEL
ALT: 14 U/L (ref 9–46)
AST: 24 U/L (ref 10–35)
Albumin: 3.7 g/dL (ref 3.6–5.1)
Alkaline Phosphatase: 43 U/L (ref 40–115)
BUN: 27 mg/dL — ABNORMAL HIGH (ref 7–25)
CO2: 30 mmol/L (ref 20–31)
Calcium: 9.7 mg/dL (ref 8.6–10.3)
Chloride: 104 mmol/L (ref 98–110)
Creat: 1.69 mg/dL — ABNORMAL HIGH (ref 0.70–1.18)
Glucose, Bld: 175 mg/dL — ABNORMAL HIGH (ref 65–99)
Potassium: 4.4 mmol/L (ref 3.5–5.3)
Sodium: 142 mmol/L (ref 135–146)
Total Bilirubin: 0.5 mg/dL (ref 0.2–1.2)
Total Protein: 7 g/dL (ref 6.1–8.1)

## 2017-03-22 LAB — CBC
HCT: 39.2 % (ref 38.5–50.0)
Hemoglobin: 12.8 g/dL — ABNORMAL LOW (ref 13.2–17.1)
MCH: 30.2 pg (ref 27.0–33.0)
MCHC: 32.7 g/dL (ref 32.0–36.0)
MCV: 92.5 fL (ref 80.0–100.0)
MPV: 9 fL (ref 7.5–12.5)
Platelets: 347 10*3/uL (ref 140–400)
RBC: 4.24 MIL/uL (ref 4.20–5.80)
RDW: 14.8 % (ref 11.0–15.0)
WBC: 9.1 10*3/uL (ref 3.8–10.8)

## 2017-03-22 LAB — LIPID PANEL
Cholesterol: 179 mg/dL (ref <200)
HDL: 49 mg/dL (ref >40)
LDL Cholesterol: 101 mg/dL — ABNORMAL HIGH (ref <100)
Total CHOL/HDL Ratio: 3.7 Ratio (ref <5.0)
Triglycerides: 144 mg/dL (ref <150)
VLDL: 29 mg/dL (ref <30)

## 2017-03-25 ENCOUNTER — Telehealth: Payer: Self-pay | Admitting: Pharmacist Clinician (PhC)/ Clinical Pharmacy Specialist

## 2017-03-25 NOTE — Telephone Encounter (Signed)
Spoke with wife, let her know that MD is interested in starting Julian Ross on Repatha.   Asked if she would ask him to call us for more information.

## 2017-03-28 DIAGNOSIS — M9903 Segmental and somatic dysfunction of lumbar region: Secondary | ICD-10-CM | POA: Diagnosis not present

## 2017-03-28 DIAGNOSIS — M545 Low back pain: Secondary | ICD-10-CM | POA: Diagnosis not present

## 2017-03-29 ENCOUNTER — Telehealth: Payer: Self-pay | Admitting: *Deleted

## 2017-03-29 NOTE — Telephone Encounter (Signed)
-----   Message from Skeet Latch, MD sent at 03/29/2017  4:57 PM EDT ----- Cholesterol levels are elevated. Please refer to lipid clinic for PCSK9 inhibitor.

## 2017-03-29 NOTE — Telephone Encounter (Signed)
Advised patient of lab results  Message sent to scheduling for lipid clinic appointment

## 2017-04-01 ENCOUNTER — Telehealth: Payer: Self-pay | Admitting: Cardiovascular Disease

## 2017-04-01 NOTE — Telephone Encounter (Signed)
Called the patient and left a voicemail to call and schedule a lipid clinic appointment.

## 2017-04-02 ENCOUNTER — Telehealth: Payer: Self-pay | Admitting: Cardiovascular Disease

## 2017-04-02 DIAGNOSIS — M9903 Segmental and somatic dysfunction of lumbar region: Secondary | ICD-10-CM | POA: Diagnosis not present

## 2017-04-02 DIAGNOSIS — M545 Low back pain: Secondary | ICD-10-CM | POA: Diagnosis not present

## 2017-04-02 NOTE — Telephone Encounter (Signed)
Called patient second day in a row and left a voicemail to call me back to schedule his lipid clinic appointment.

## 2017-04-09 ENCOUNTER — Ambulatory Visit: Payer: Medicare Other | Admitting: Cardiovascular Disease

## 2017-04-09 DIAGNOSIS — M9903 Segmental and somatic dysfunction of lumbar region: Secondary | ICD-10-CM | POA: Diagnosis not present

## 2017-04-09 DIAGNOSIS — M545 Low back pain: Secondary | ICD-10-CM | POA: Diagnosis not present

## 2017-04-10 DIAGNOSIS — M9904 Segmental and somatic dysfunction of sacral region: Secondary | ICD-10-CM | POA: Diagnosis not present

## 2017-04-10 DIAGNOSIS — S23130A Subluxation of T4/T5 thoracic vertebra, initial encounter: Secondary | ICD-10-CM | POA: Diagnosis not present

## 2017-04-10 DIAGNOSIS — M9902 Segmental and somatic dysfunction of thoracic region: Secondary | ICD-10-CM | POA: Diagnosis not present

## 2017-04-10 DIAGNOSIS — S332XXA Dislocation of sacroiliac and sacrococcygeal joint, initial encounter: Secondary | ICD-10-CM | POA: Diagnosis not present

## 2017-04-23 ENCOUNTER — Ambulatory Visit (INDEPENDENT_AMBULATORY_CARE_PROVIDER_SITE_OTHER): Payer: Medicare Other | Admitting: Pharmacist

## 2017-04-23 DIAGNOSIS — E7800 Pure hypercholesterolemia, unspecified: Secondary | ICD-10-CM

## 2017-04-23 DIAGNOSIS — E785 Hyperlipidemia, unspecified: Secondary | ICD-10-CM | POA: Insufficient documentation

## 2017-04-23 DIAGNOSIS — I25119 Atherosclerotic heart disease of native coronary artery with unspecified angina pectoris: Secondary | ICD-10-CM

## 2017-04-23 NOTE — Patient Instructions (Signed)
Lipid Clinic [phone]** Julian Ross/Julian Ross  Cholesterol Cholesterol is a fat. Your body needs a small amount of cholesterol. Cholesterol (plaque) may build up in your blood vessels (arteries). That makes you more likely to have a heart attack or stroke. You cannot feel your cholesterol level. Having a blood test is the only way to find out if your level is high. Keep your test results. Work with your doctor to keep your cholesterol at a good level. What do the results mean?  Total cholesterol is how much cholesterol is in your blood.  LDL is bad cholesterol. This is the type that can build up. Try to have low LDL.  HDL is good cholesterol. It cleans your blood vessels and carries LDL away. Try to have high HDL.  Triglycerides are fat that the body can store or burn for energy. What are good levels of cholesterol?  Total cholesterol below 200.  LDL below 100 is good for people who have health risks. LDL below 70 is good for people who have very high risks.  HDL above 40 is good. It is best to have HDL of 60 or higher.  Triglycerides below 150. How can I lower my cholesterol? Diet  Follow your diet program as told by your doctor.  Choose fish, white meat chicken, or [place] that is roasted or baked. Try not to eat red meat, fried foods, sausage, or lunch meats.  Eat lots of fresh fruits and vegetables.  Choose whole grains, beans, pasta, potatoes, and cereals.  Choose olive oil, corn oil, or canola oil. Only use small amounts.  Try not to eat butter, mayonnaise, shortening, or palm kernel oils.  Try not to eat foods with trans fats.  Choose low-fat or nonfat dairy foods.  Drink skim or nonfat milk.  Eat low-fat or nonfat yogurt and cheeses.  Try not to drink whole milk or cream.  Try not to eat ice cream, egg yolks, or full-fat cheeses.  Healthy desserts include angel food cake, ginger snaps, animal crackers, hard candy, popsicles, and low-fat or nonfat frozen  yogurt. Try not to eat pastries, cakes, pies, and cookies. Exercise  Follow your exercise program as told by your doctor.  Be more active. Try gardening, walking, and taking the stairs.  Ask your doctor about ways that you can be more active. Medicine  Take over-the-counter and prescription medicines only as told by your doctor. This information is not intended to replace advice given to you by your health care provider. Make sure you discuss any questions you have with your health care provider. Document Released: 03/15/2009 Document Revised: 07/18/2016 Document Reviewed: 06/28/2016 Elsevier Interactive Patient Education  2017 [employer].

## 2017-04-23 NOTE — Progress Notes (Signed)
Patient ID: Julian Ross                 DOB: 10-11-1946                    MRN: [id number]      HPI:  Julian Ross is a 71 y.o. male patient referred to lipid clinic by Dr [name]. PMH includes CABG, ischemic cardiomyopathy, combined systolic and diastolic HF, A-Fib, hyperlipidemia, obstructive sleep apnea, hypertension, diabetes, rheumatoid arthritis, and chronic fatigue syndrome.   Also noted patient reports intolerance to multiple statins.  Currently taking fenofibrate, zetia and lovaza for lipid management but unable to reach goal of LDL <70.  Patient presents to lipid clinic for evaluation and potential initiation of PCSK9i therapy.  Current Medications:  Fenofibrate 145mg  daily Omega-3 (Lovaza) 1g twice daily Zetia 10mg  daily  Intolerances:  Atorvastatin 10mg  daily - severe myalgia and arthralgia  Rosuvastatin 5mg  daily - severe myalgia and arthralgia  Rosuvastatin 5mg  every other day - severe myalgia and arthralgia   Risk Factors: CAD; CABG, hyperlipidemia, hypertension, diabetes  LDL goal: <70  Diet: eat fresh foods and vegetables; mostly home cooked  Exercise: chronic fatigue - not significant exercise  Family History: father  - stroke at age 63, and MI with stent in this 66s  Social History: denies alcohol, smoking or smokeless tobacco use  Labs: CHO 179; TG 144; LDLc 101 (03/21/17) - taking fenofibate, omega-3 and zetia  Past Medical History:  Diagnosis Date  . Anemia   . Anxiety state   . CAD (coronary artery disease)    16 prior stents (at [facility])  . Cardiomyopathy, ischemic   . Chronic lower back pain   . Chronic systolic CHF (congestive heart failure) ([place])   . CKD (chronic kidney disease), stage III   . Depression   . Diabetes mellitus type 2 in obese ([place])   . Fibromyalgia   . Hepatitis A    at age 52  . High cholesterol   . History of blood transfusion    "related to OR"  . Hypertension   . Hypokalemia   . MI (myocardial infarction) 1990;  1995; 1997  . OSA on CPAP   . Osteoarthritis   . Persistent atrial fibrillation ([place]) 02/2016   occured in setting of pneumonia  . Rheumatoid arthritis of multiple sites with negative rheumatoid factor ([place])   . Septic shock ([place]) 02/2016   in setting of severe pneumonia    Current Outpatient Prescriptions on File Prior to Visit  Medication Sig Dispense Refill  . acetaminophen (TYLENOL) 325 MG tablet Take 650 mg by mouth every 4 (four) hours as needed for mild pain or headache.    . ALPRAZolam (NIRAVAM) 0.5 MG dissolvable tablet Take 1 tablet by mouth as needed (Take as directed).   2  . apixaban (ELIQUIS) 5 MG TABS tablet Take 1 tablet (5 mg total) by mouth 2 (two) times daily. 60 tablet   . carvedilol (COREG) 6.25 MG tablet Take 1 tablet (6.25 mg total) by mouth 2 (two) times daily with a meal. 180 tablet 3  . clotrimazole (LOTRIMIN) 1 % cream Apply 1 application topically 2 (two) times daily. 30 g 0  . fenofibrate (TRICOR) 145 MG tablet Take 145 mg by mouth daily.    . folic acid (FOLVITE) 1 MG tablet Take 2 mg by mouth daily.     . furosemide (LASIX) 40 MG tablet Take 1 tablet (40 mg total) by mouth 2 (  two) times daily. 180 tablet 1  . gabapentin (NEURONTIN) 300 MG capsule Take 300 mg by mouth at bedtime.   2  . hydrocortisone (ANUSOL-HC) 2.5 % rectal cream Place 1 application rectally 2 (two) times daily as needed for hemorrhoids or itching. 30 g 0  . insulin lispro protamine-lispro (HUMALOG 50/50 MIX) (50-50) 100 UNIT/ML SUSP injection Inject 40-50 Units into the skin See admin instructions. Takes 50 units in am, 40 units midday, 50 units in pm    . leucovorin (WELLCOVORIN) 10 MG tablet Take 10 mg by mouth See admin instructions. Take 1 tablet (10 mg) by mouth every 12 hours and 24 hours after the methotrexate dose    . LUMIGAN 0.01 % SOLN Place 1 drop into both eyes at bedtime.   0  . magnesium oxide (MAG-OX) 400 (241.3 Mg) MG tablet Take 1 tablet (400 mg total) by mouth 2 (two) times  daily. (Patient taking differently: Take 400 mg by mouth daily. ) 60 tablet 0  . Methotrexate Sodium (METHOTREXATE, PF,) 50 MG/2ML injection Inject 15 mg into the muscle every Friday.   0  . Misc Natural Products (OSTEO BI-FLEX ADV DOUBLE ST PO) Take 1 tablet by mouth daily as needed (takes occassionally).     Marland Kitchen omega-3 acid ethyl esters (LOVAZA) 1 g capsule Take 1 capsule by mouth 2 (two) times daily.  11  . omeprazole (PRILOSEC) 20 MG capsule Take 20 mg by mouth daily as needed (heartburn or acid reflux).    Marland Kitchen PARoxetine (PAXIL) 40 MG tablet Take 40 mg by mouth daily.   11  . potassium chloride SA (K-DUR,KLOR-CON) 20 MEQ tablet 2 BY MOUTH DAILY 180 tablet 1  . predniSONE (DELTASONE) 5 MG tablet Take 5 mg by mouth daily with breakfast.    . ranolazine (RANEXA) 500 MG 12 hr tablet Take 1 tablet (500 mg total) by mouth 2 (two) times daily. 180 tablet 1  . tamsulosin (FLOMAX) 0.4 MG CAPS capsule Take 0.4 mg by mouth daily.   4  . tiZANidine (ZANAFLEX) 4 MG tablet Take 1 tablet by mouth every 8 (eight) hours as needed for muscle spasms.   2  . topiramate (TOPAMAX) 25 MG tablet Take 25 mg by mouth 2 (two) times daily.    . traMADol (ULTRAM) 50 MG tablet Take 1 tablet by mouth 3 (three) times daily as needed (pain). Take as directed  2  . VITAMIN D, ERGOCALCIFEROL, PO Take 5,000 Units by mouth daily.     Marland Kitchen ZETIA 10 MG tablet Take 10 mg by mouth daily.   11   No current facility-administered medications on file prior to visit.     Allergies  Allergen Reactions  . Statins Other (See Comments)    Causes stiffness in joints   . Ace Inhibitors Cough  . Atenolol Other (See Comments)    Unknown reaction  . Contrast Media [Iodinated Diagnostic Agents] Rash    Has to take benadryl prior to use  . Penicillins Hives and Rash    Has patient had a PCN reaction causing immediate rash, facial/tongue/throat swelling, SOB or lightheadedness with hypotension: Yes Has patient had a PCN reaction causing severe  rash involving mucus membranes or skin necrosis: No Has patient had a PCN reaction that required hospitalization pt was in the hospital at time of last reaction - heart attack Has patient had a PCN reaction occurring within the last 10 years: No If all of the above answers are "NO", then may proceed with  Cephalosporin use.    Pure hyperlipidemia: Patient with complicated cardia history. LDLc remains elevated above goal of <70 for secondary prevention.  Patient compliant with lifestyle modifications, and pharmacological therapy but intolerant to multiple statins including atorvastatin 10mg  daily, rosuvastatin 5mg  daily, and rosuvastatin 5mg  every other day. Statins cause severe myalgia and difficulty ambulating.  Patient is a good candidate for PCSK9i therapy and process for Repatha approval will be initiated.  Repatha indication, storage, administration and common side effects discussed with patient during clinic appointment.  Paperwork process and estimated time frame also discussed with patient. Will apply for insurance pre-approval this week and complete [employer] safety net paperwork as needed after co-pay information available.  Levelle Edelen Rodriguez-Guzman PharmD, [place] [id number] 04/23/2017 12:45 PM

## 2017-04-25 ENCOUNTER — Other Ambulatory Visit: Payer: Self-pay | Admitting: Pharmacist

## 2017-04-25 MED ORDER — EVOLOCUMAB 140 MG/ML ~~LOC~~ SOAJ: 140.0000 mg | SUBCUTANEOUS | 11 refills | Status: DC

## 2017-05-15 ENCOUNTER — Other Ambulatory Visit: Payer: Self-pay | Admitting: Cardiovascular Disease

## 2017-05-15 NOTE — Telephone Encounter (Signed)
REFILL 

## 2017-05-15 NOTE — Telephone Encounter (Signed)
Refill Request.  

## 2017-05-16 IMAGING — CR DG CHEST 1V PORT
1 series · 1 of 1 positions shown · non-contrast
Comparison: 03/18/2016

CLINICAL DATA: Dyspnea for 2 months, worsened over the past 2 days.

EXAM:
PORTABLE CHEST 1 VIEW

[AP]
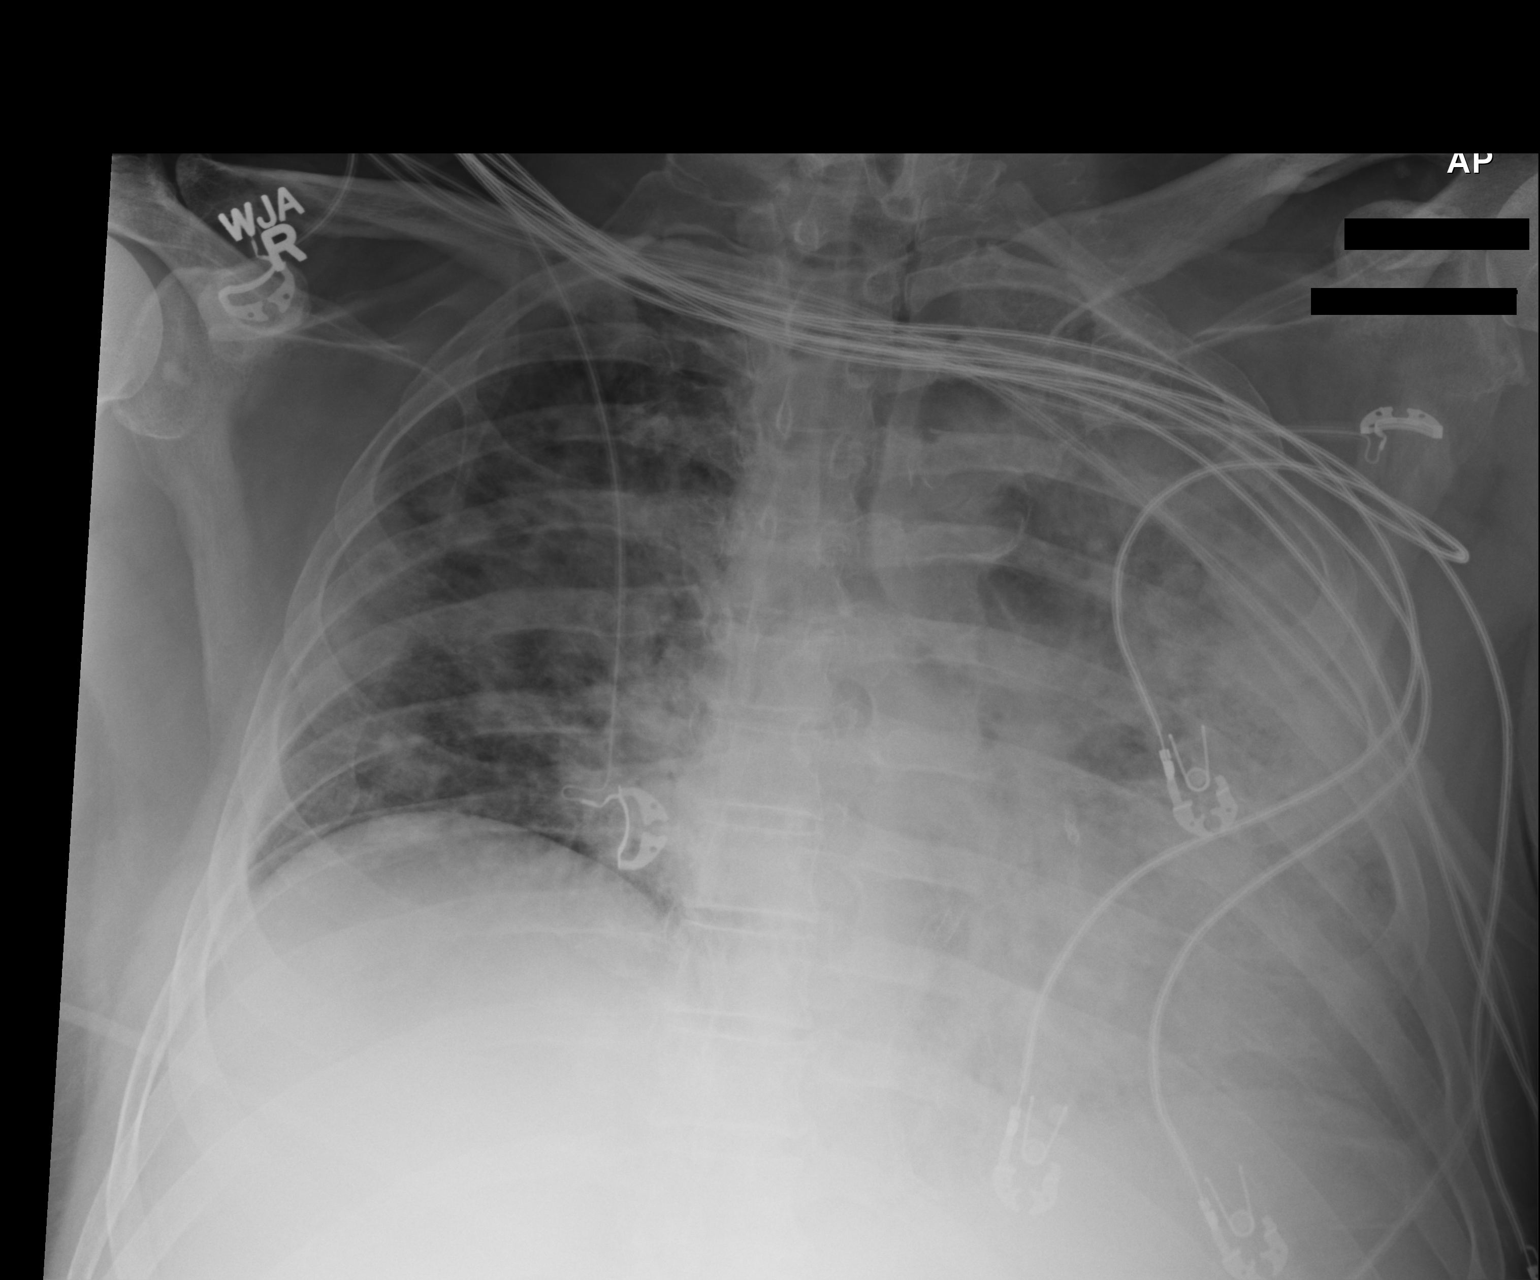

[1 of 1 positions shown; findings below may reference images not displayed]

FINDINGS: Multifocal consolidation has mildly worsened from 03/18/2016, now
with more confluent opacity in the central right lung and in the
left upper lobe periphery. There may be a left pleural
IMPRESSION: Worsening consolidation

## 2017-05-16 IMAGING — CR DG CHEST 1V PORT
1 series · 1 of 1 positions shown · non-contrast
Comparison: 03/19/2016

CLINICAL DATA: Respiratory failure

EXAM:
PORTABLE CHEST 1 VIEW

[AP]
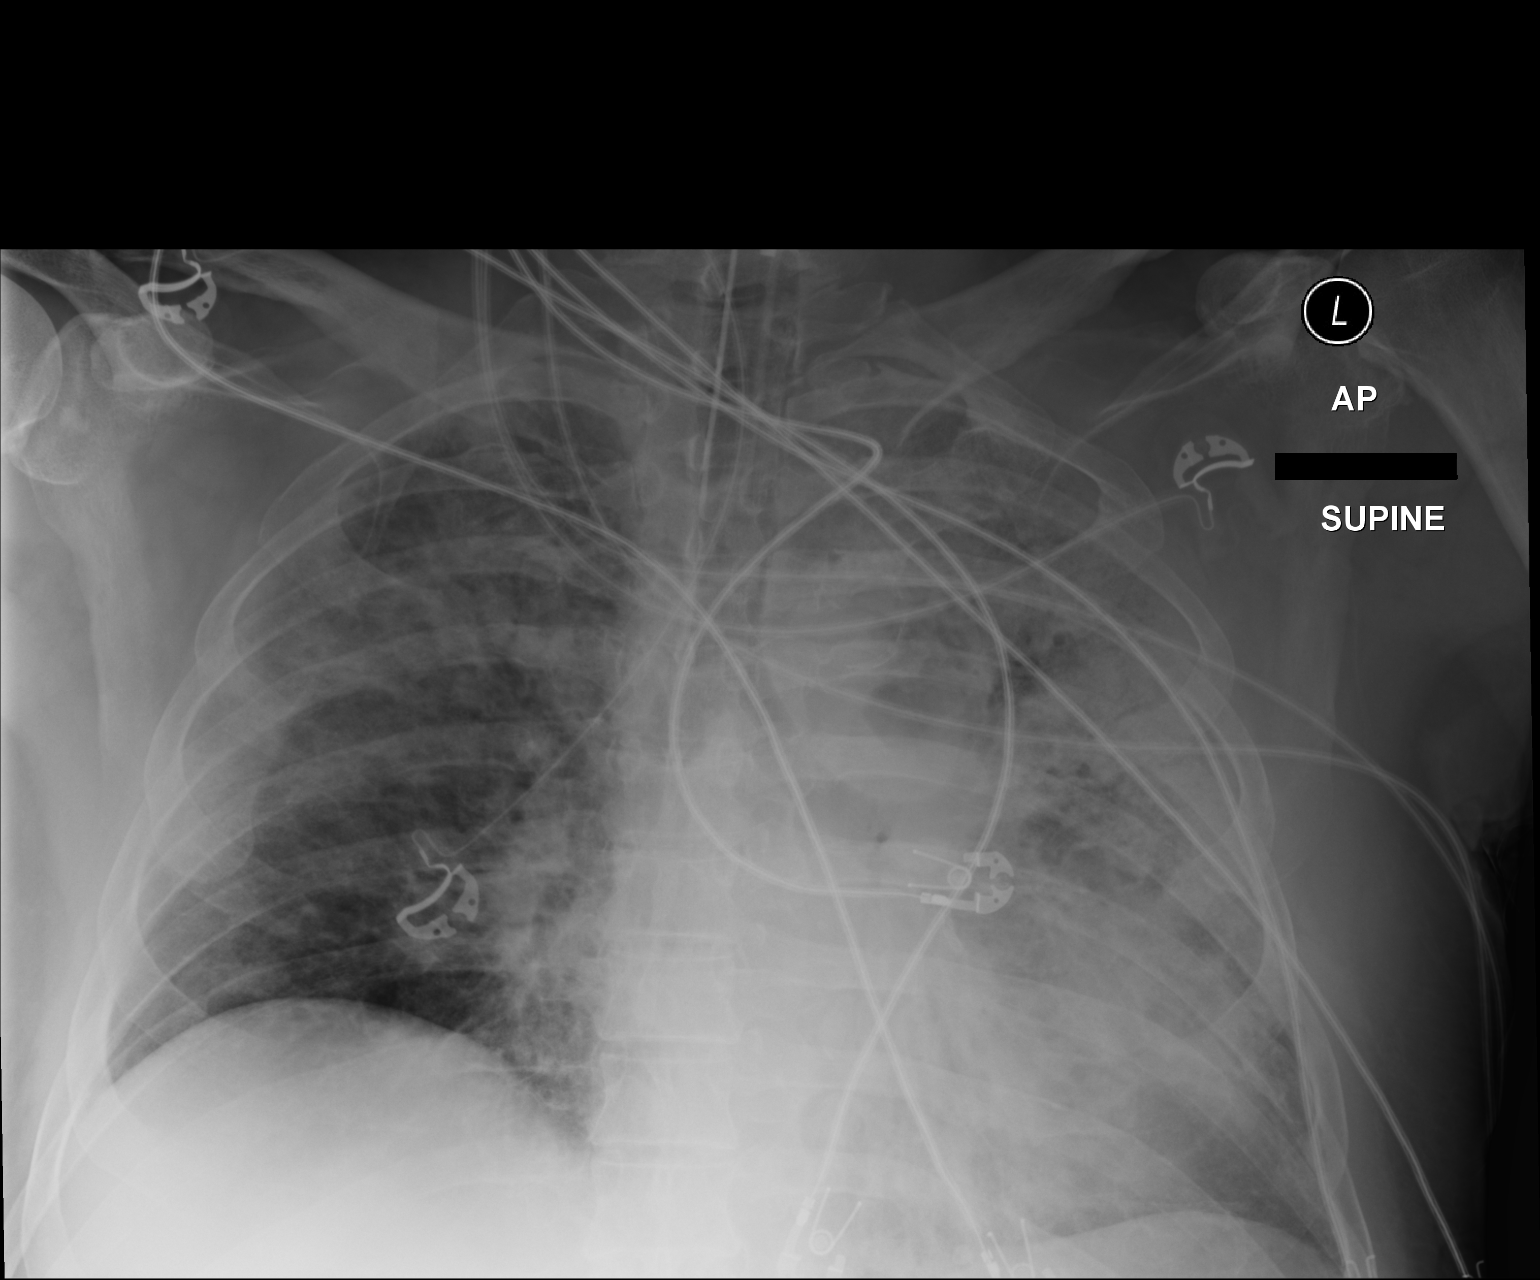

[1 of 1 positions shown; findings below may reference images not displayed]

FINDINGS: The endotracheal tube is 5.5 cm above the carina. Multifocal
consolidation persists bilaterally, probably not significantly
changed. No pneumothorax.
IMPRESSION: Satisfactorily positioned ETT. No significant interval change in the
bilateral airspace opacities.

## 2017-05-17 IMAGING — CR DG CHEST 1V PORT
1 series · 1 of 1 positions shown · non-contrast
Comparison: Chest x-rays dated 03/19/2016 and 03/18/2016.

CLINICAL DATA: Acute respiratory failure with hypoxemia.

EXAM:
PORTABLE CHEST 1 VIEW

[AP]
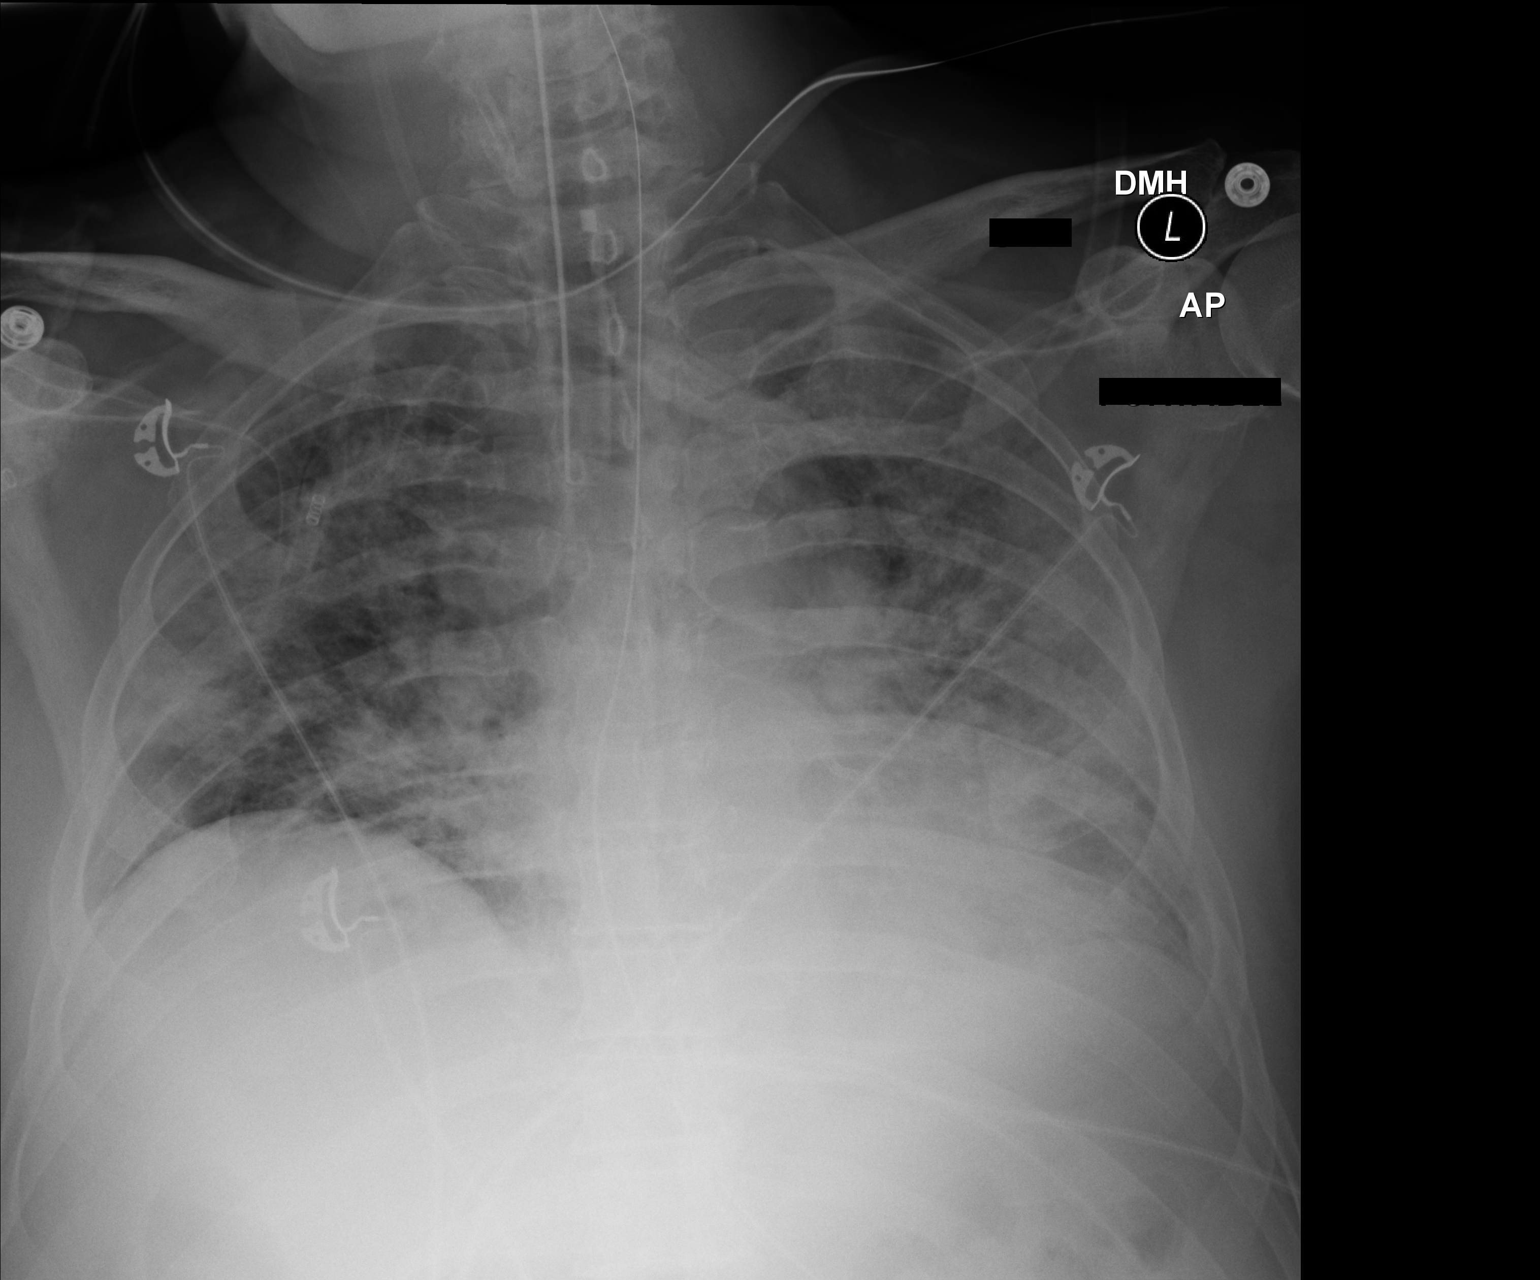

[1 of 1 positions shown; findings below may reference images not displayed]

FINDINGS: Endotracheal tube remains adequately positioned with tip just above
the level of the carina. Enteric tube passes below the diaphragm.

Cardiomediastinal silhouette appears stable in size and
configuration. There is persistent central pulmonary vascular
congestion and patchy bilateral airspace opacities which are most
suggestive of pulmonary edema pattern. Perhaps mildly improved
aeration within the left lung. No pneumothorax seen.
IMPRESSION: Patchy bilateral airspace opacities, multifocal pneumonia versus
pulmonary edema, not significantly changed in overall extent,
perhaps slightly improved aeration within the left lung. Favor
pulmonary edema. ARDS is another consideration.

## 2017-05-20 DIAGNOSIS — N401 Enlarged prostate with lower urinary tract symptoms: Secondary | ICD-10-CM | POA: Diagnosis not present

## 2017-05-20 DIAGNOSIS — I480 Paroxysmal atrial fibrillation: Secondary | ICD-10-CM | POA: Diagnosis not present

## 2017-05-20 DIAGNOSIS — I255 Ischemic cardiomyopathy: Secondary | ICD-10-CM | POA: Diagnosis not present

## 2017-05-20 DIAGNOSIS — E1129 Type 2 diabetes mellitus with other diabetic kidney complication: Secondary | ICD-10-CM | POA: Diagnosis not present

## 2017-05-20 DIAGNOSIS — E1142 Type 2 diabetes mellitus with diabetic polyneuropathy: Secondary | ICD-10-CM | POA: Diagnosis not present

## 2017-05-20 DIAGNOSIS — N183 Chronic kidney disease, stage 3 (moderate): Secondary | ICD-10-CM | POA: Diagnosis not present

## 2017-05-20 DIAGNOSIS — E559 Vitamin D deficiency, unspecified: Secondary | ICD-10-CM | POA: Diagnosis not present

## 2017-05-20 DIAGNOSIS — G4733 Obstructive sleep apnea (adult) (pediatric): Secondary | ICD-10-CM | POA: Diagnosis not present

## 2017-05-20 DIAGNOSIS — Z6830 Body mass index (BMI) 30.0-30.9, adult: Secondary | ICD-10-CM | POA: Diagnosis not present

## 2017-05-20 DIAGNOSIS — E784 Other hyperlipidemia: Secondary | ICD-10-CM | POA: Diagnosis not present

## 2017-05-20 DIAGNOSIS — I2510 Atherosclerotic heart disease of native coronary artery without angina pectoris: Secondary | ICD-10-CM | POA: Diagnosis not present

## 2017-05-20 DIAGNOSIS — Z1389 Encounter for screening for other disorder: Secondary | ICD-10-CM | POA: Diagnosis not present

## 2017-05-20 IMAGING — CT CT HEAD W/O CM
4 series · 19 of 30 positions shown, 20 images · non-contrast
Comparison: 03/18/2016.

CLINICAL DATA: Increasing lethargy after extubation.

EXAM:
CT HEAD WITHOUT CONTRAST
TECHNIQUE: Contiguous axial images were obtained from the base of the skull
through the vertex without intravenous contrast.

[Series 2: head without · axial · non-contrast · 0.44mm/px · z∈[-123,-68]mm · 2 of 35 slices shown, 3 images (1 of 2)]
[im 12/35  brain]
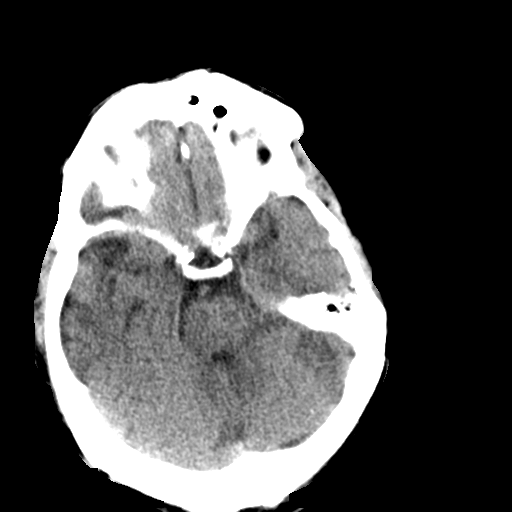
[im 12/35  bone]
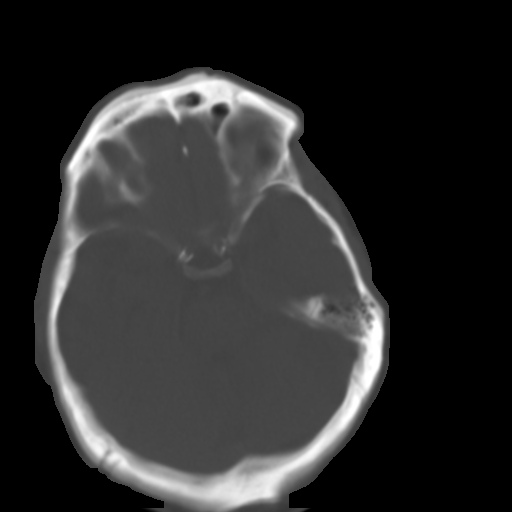
[im 23/35  brain]
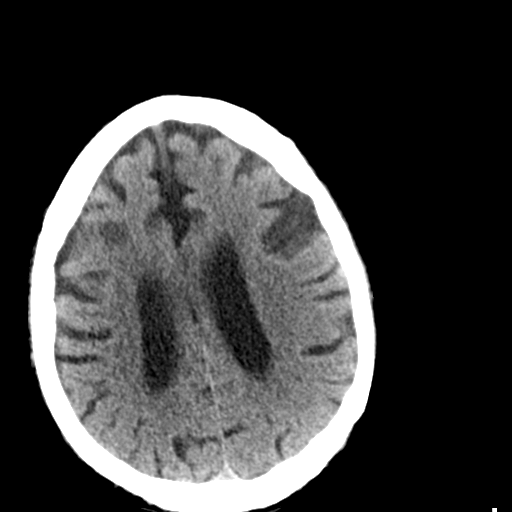

[Series 3: head bone · axial · 0.44mm/px · z∈[-162,-24]mm · 8 of 87 slices shown (1 of 2)]
[im 9/87  bone]
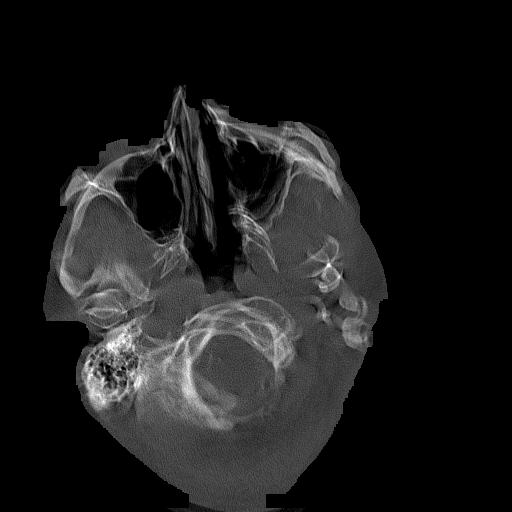
[im 18/87  bone]
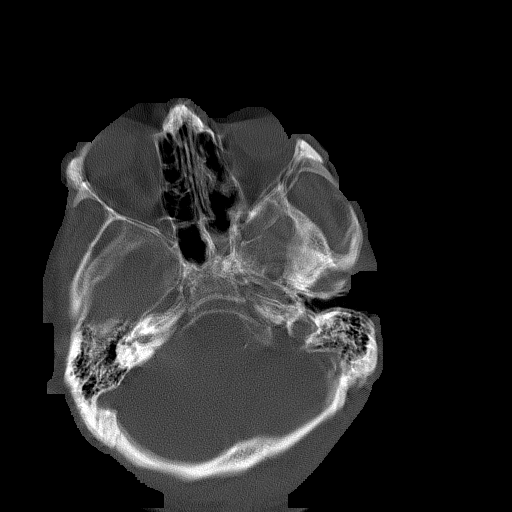
[im 26/87  bone]
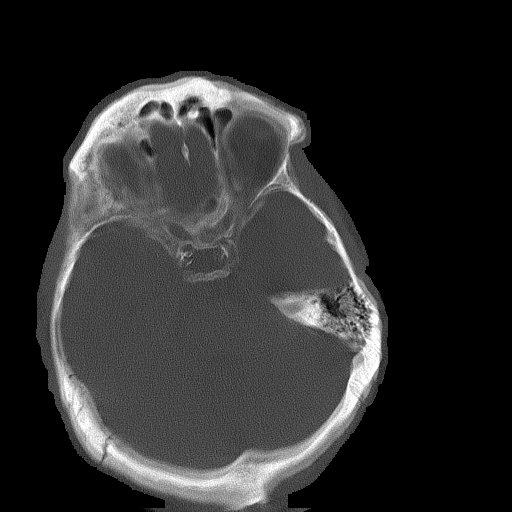
[im 35/87  bone]
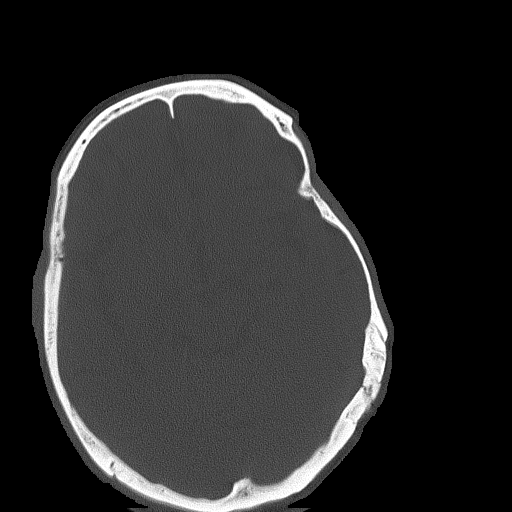
[im 52/87  bone]
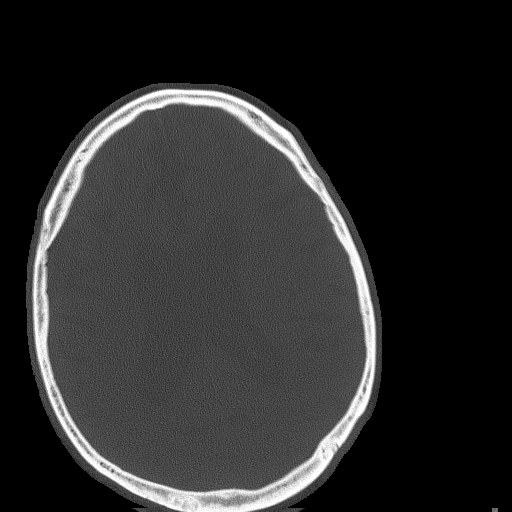
[im 61/87  bone]
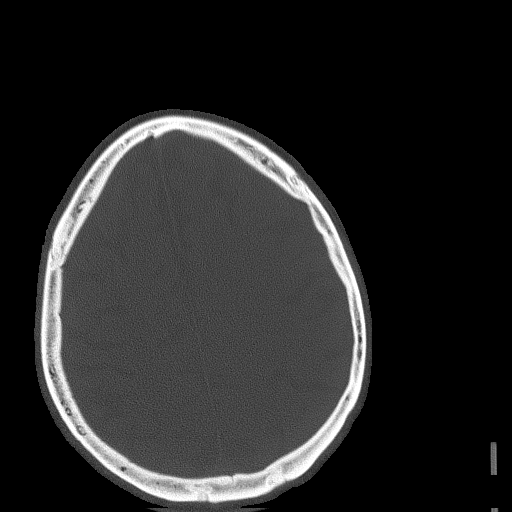
[im 69/87  bone]
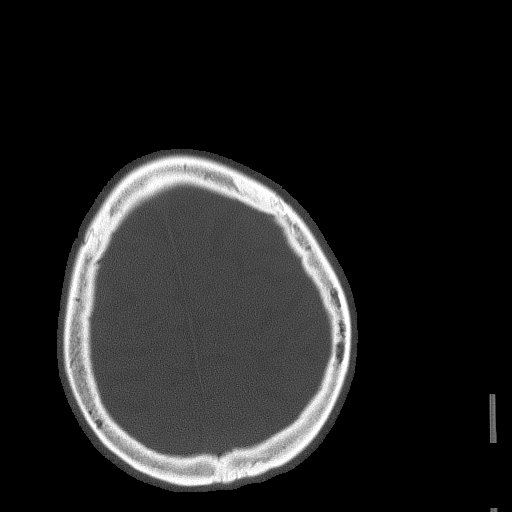
[im 78/87  bone]
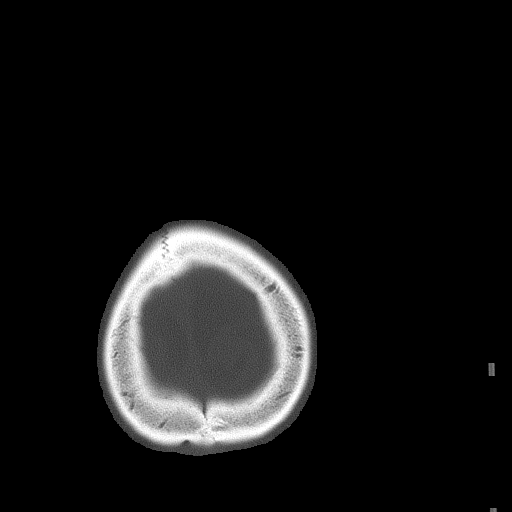

[Series 4: head without · axial · non-contrast · 0.44mm/px · z∈[-124,-69]mm · 2 of 35 slices shown (2 of 2)]
[im 12/35  brain]
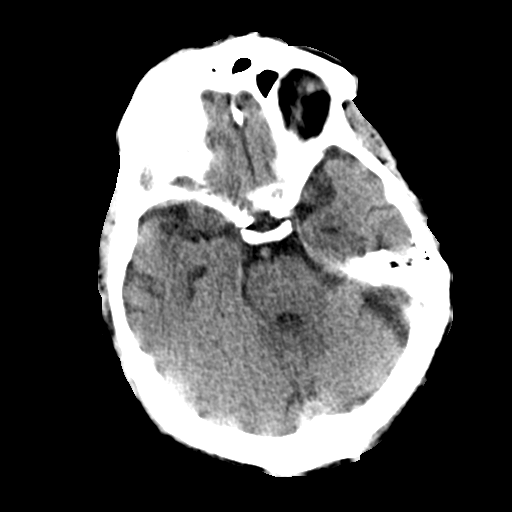
[im 23/35  brain]
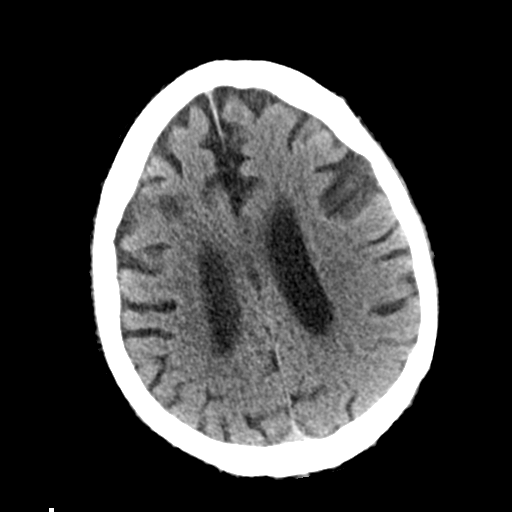

[Series 5: head bone · axial · 0.44mm/px · z∈[-163,-43]mm · 7 of 87 slices shown (2 of 2)]
[im 9/87  bone]
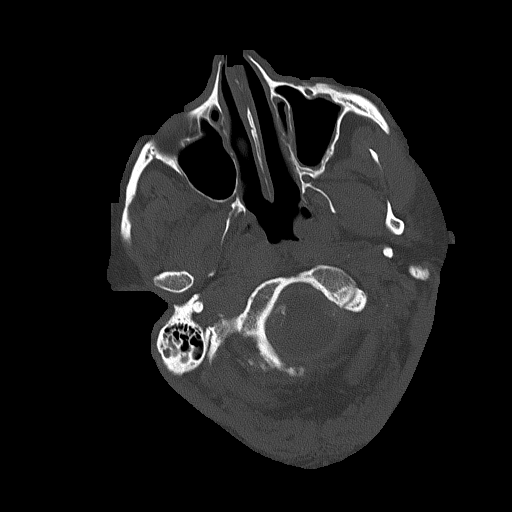
[im 18/87  bone]
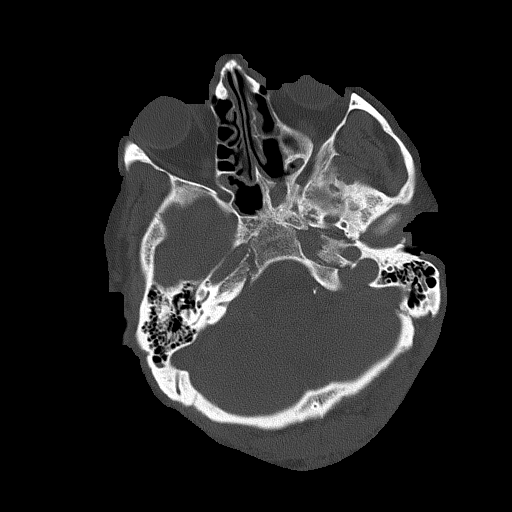
[im 26/87  bone]
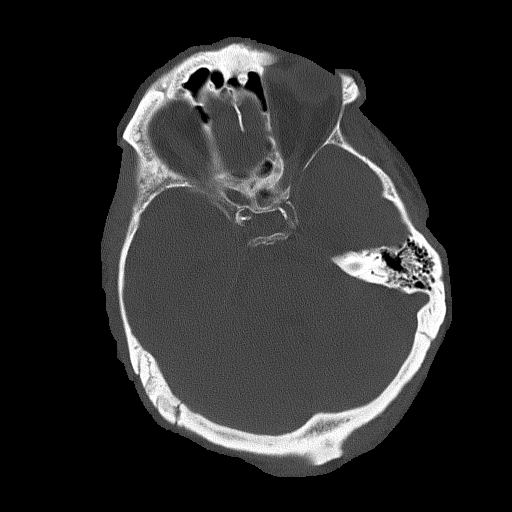
[im 35/87  bone]
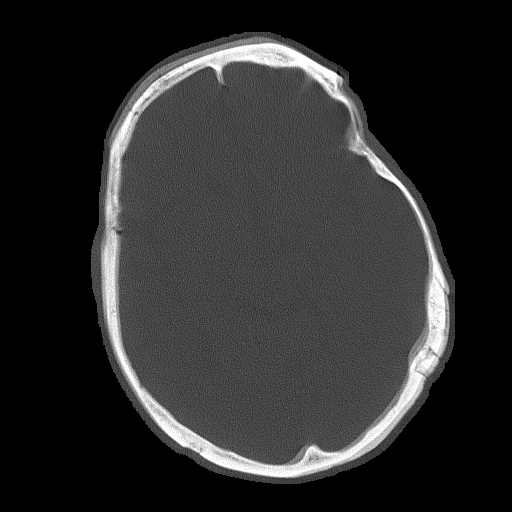
[im 52/87  bone]
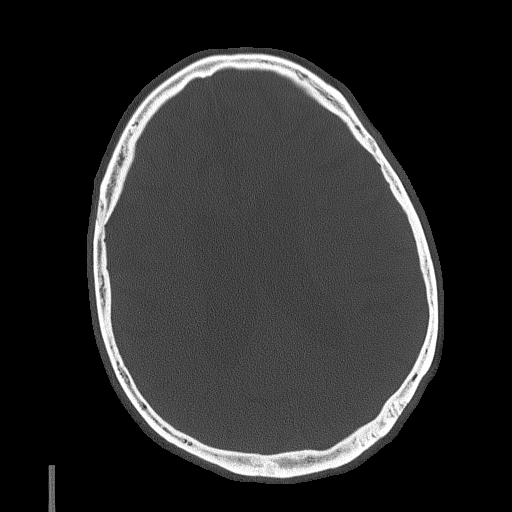
[im 61/87  bone]
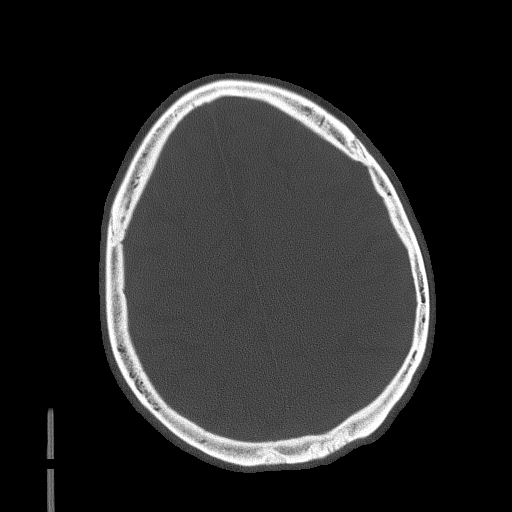
[im 69/87  bone]
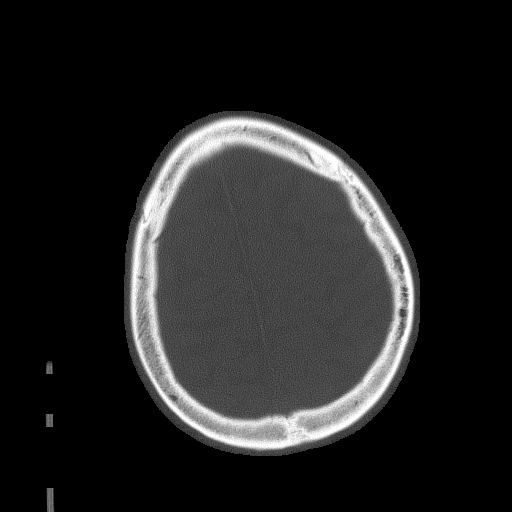

[19 of 30 positions shown; findings below may reference images not displayed]

FINDINGS: No evidence for acute infarction, hemorrhage, mass lesion,
hydrocephalus, or extra-axial fluid. Global atrophy. Chronic
microvascular ischemic change. No osseous lesion. Vascular
calcification. Stable changes of probable chronic sinus disease.
Unchanged appearance from priors.
IMPRESSION: Stable chronic changes as described. No findings suggestive of acute
cerebral infarction or hemorrhage.

## 2017-05-22 DIAGNOSIS — E119 Type 2 diabetes mellitus without complications: Secondary | ICD-10-CM | POA: Diagnosis not present

## 2017-05-22 DIAGNOSIS — H33321 Round hole, right eye: Secondary | ICD-10-CM | POA: Diagnosis not present

## 2017-05-22 DIAGNOSIS — H43813 Vitreous degeneration, bilateral: Secondary | ICD-10-CM | POA: Diagnosis not present

## 2017-06-05 ENCOUNTER — Telehealth: Payer: Self-pay | Admitting: Pharmacist

## 2017-06-05 NOTE — Telephone Encounter (Signed)
Talked to patient's wife. [employer] aproved Repatha 140mg  delivery until 12/30/17.  Need to call [facility] to arrange delivery. Patient to call this week.

## 2017-06-19 ENCOUNTER — Encounter: Payer: Self-pay | Admitting: Cardiovascular Disease

## 2017-06-19 ENCOUNTER — Ambulatory Visit (INDEPENDENT_AMBULATORY_CARE_PROVIDER_SITE_OTHER): Payer: Medicare Other | Admitting: Cardiovascular Disease

## 2017-06-19 VITALS — BP 110/60 | HR 87 | Ht 71.0 in | Wt 229.0 lb

## 2017-06-19 DIAGNOSIS — N183 Chronic kidney disease, stage 3 unspecified: Secondary | ICD-10-CM

## 2017-06-19 DIAGNOSIS — I5042 Chronic combined systolic (congestive) and diastolic (congestive) heart failure: Secondary | ICD-10-CM | POA: Diagnosis not present

## 2017-06-19 DIAGNOSIS — I481 Persistent atrial fibrillation: Secondary | ICD-10-CM | POA: Diagnosis not present

## 2017-06-19 DIAGNOSIS — I209 Angina pectoris, unspecified: Secondary | ICD-10-CM

## 2017-06-19 DIAGNOSIS — I25118 Atherosclerotic heart disease of native coronary artery with other forms of angina pectoris: Secondary | ICD-10-CM | POA: Diagnosis not present

## 2017-06-19 DIAGNOSIS — M0579 Rheumatoid arthritis with rheumatoid factor of multiple sites without organ or systems involvement: Secondary | ICD-10-CM | POA: Diagnosis not present

## 2017-06-19 DIAGNOSIS — Z79899 Other long term (current) drug therapy: Secondary | ICD-10-CM | POA: Diagnosis not present

## 2017-06-19 DIAGNOSIS — M79671 Pain in right foot: Secondary | ICD-10-CM | POA: Diagnosis not present

## 2017-06-19 DIAGNOSIS — E7800 Pure hypercholesterolemia, unspecified: Secondary | ICD-10-CM | POA: Diagnosis not present

## 2017-06-19 DIAGNOSIS — I25119 Atherosclerotic heart disease of native coronary artery with unspecified angina pectoris: Secondary | ICD-10-CM

## 2017-06-19 DIAGNOSIS — G8929 Other chronic pain: Secondary | ICD-10-CM | POA: Diagnosis not present

## 2017-06-19 DIAGNOSIS — I10 Essential (primary) hypertension: Secondary | ICD-10-CM | POA: Diagnosis not present

## 2017-06-19 DIAGNOSIS — I4819 Other persistent atrial fibrillation: Secondary | ICD-10-CM

## 2017-06-19 DIAGNOSIS — M79672 Pain in left foot: Secondary | ICD-10-CM | POA: Diagnosis not present

## 2017-06-19 NOTE — Progress Notes (Signed)
Cardiology Office Note   Date:  06/19/2017   ID:  Julian Ross, DOB [birth year], MRN [id number]  PCP:  Reynold Bowen, MD  Cardiologist:   Skeet Latch, MD   No chief complaint on file.    History of Present Illness: Julian Ross is a 71 y.o. male with chronic systolic and diastolic heart failure LVEF improved from 30-35% to 50-55%, paroxysmal atrial fibrillation, CAD s/p multiple PCIs, who presents for follow up.  Julian Ross was admitted to the hospital 03/18/16-04/02/16 with sepsis, hypoxic respiratory failure, metabolic encephalopathy and acute on chronic heart failure.  During that hospitalization he also had elevated troponin consistent with demand ischemia (troponin 0.13) and new onset atrial fibrillation with RVR.  He was diuresed to a discharge weight of 196 lb (from 208 lb on admit).  Julian Ross has an extensive history of CAD.  He previously had 16 stents placed in [place], [place].  His cardiologist there is [name].  The inpatient team contacted [name], who informed them that Julian Ross has a tight RCA lesion but no recent interventions.  He felt that Julian Ross could transition from Plavix to aspirin.  He has been intolerant to statins in the past.    Julian Ross underwent DCCV on 06/04/16.  After cardioversion he developed bradycardia and initially metoprolol was reduced.  Then he was switched from metoprolol to carvedilol.  This was further reduced to 6.25mg  bid on 06/19/16.  On 7/21 he had a recurrent episode of syncope. Hydralazine and isosorbide were discontinued.  Troponin was mildly elevated at 0.03.  CT of the head was negative, and chest x-ray showed no acute edema or infiltrate.  During that hospitalization he was noted to be orthostatic.  His SBP dropped to 80 when standing.  Carvedilol was reduced 1.5625 mg bid and his orthostasis was improved.  A 30 day event monitor was placed and it was recommended that outpatient Myoview be considered.  Echo that admission showed an  improvement in his LVEF to 50-55%. He wore an event monitor 07/31/16 that revealed short runs of atrial fibrillation as well as PVCs and PACs. He reported angina and underwent LHC  10/03/16 where he was noted to have severe, diffuse, three-vessel coronary disease with multiple areas of prior stenting. A recommendation was made for intensification of medical therapy or consideration of coronary artery bypass grafting. He was scheduled for CABG/MAZE but developed pneumonia requiring hospitalization 10/2016.    At his last appointment Julian Ross was noted to be volume overloaded.  Lasix was increased to bid.  His LDL was noted to be 101 and he started on Repatha 03/2017. He notes some mild muscle aching but is otherwise feeling well. He denies chest pain or shortness of breath. He has not been exercising lately and notes that he has been eating too much. He thinks that this is the cause of his weight gain.   Past Medical History:  Diagnosis Date  . Anemia   . Anxiety state   . CAD (coronary artery disease)    16 prior stents (at [facility])  . Cardiomyopathy, ischemic   . Chronic lower back pain   . Chronic systolic CHF (congestive heart failure) ([place])   . CKD (chronic kidney disease), stage III   . Depression   . Diabetes mellitus type 2 in obese ([place])   . Fibromyalgia   . Hepatitis A    at age 6  . High cholesterol   . History of blood transfusion    "  related to OR"  . Hypertension   . Hypokalemia   . MI (myocardial infarction) ([place]) 1990; 1995; 1997  . OSA on CPAP   . Osteoarthritis   . Persistent atrial fibrillation ([place]) 02/2016   occured in setting of pneumonia  . Rheumatoid arthritis of multiple sites with negative rheumatoid factor ([place])   . Septic shock ([place]) 02/2016   in setting of severe pneumonia    Past Surgical History:  Procedure Laterality Date  . BACK SURGERY    . CARDIAC CATHETERIZATION N/A 10/03/2016   Procedure: Left Heart Cath and Coronary Angiography;   Surgeon: Belva Crome, MD;  Location: [facility];  Service: Cardiovascular;  Laterality: N/A;  . CARDIOVERSION N/A 06/04/2016   Procedure: CARDIOVERSION;  Surgeon: Skeet Latch, MD;  Location: [facility];  Service: Cardiovascular;  Laterality: N/A;  . CATARACT EXTRACTION W/ INTRAOCULAR LENS  IMPLANT, BILATERAL Bilateral 2000s  . CORONARY ANGIOPLASTY  early 78s  . CORONARY ANGIOPLASTY WITH STENT PLACEMENT     "I've got 15 stents" (07/19/2016)  . KNEE ARTHROSCOPY Left 1990s  . POSTERIOR LUMBAR FUSION  2015   L4-5-S1  . right knee surgery     age of 31  . TONSILLECTOMY AND ADENOIDECTOMY  1949     Current Outpatient Prescriptions  Medication Sig Dispense Refill  . acetaminophen (TYLENOL) 325 MG tablet Take 650 mg by mouth every 4 (four) hours as needed for mild pain or headache.    . ALPRAZolam (NIRAVAM) 0.5 MG dissolvable tablet Take 1 tablet by mouth as needed (Take as directed).   2  . apixaban (ELIQUIS) 5 MG TABS tablet Take 1 tablet (5 mg total) by mouth 2 (two) times daily. 60 tablet   . carvedilol (COREG) 6.25 MG tablet Take 1 tablet (6.25 mg total) by mouth 2 (two) times daily with a meal. 180 tablet 3  . clotrimazole (LOTRIMIN) 1 % cream Apply 1 application topically 2 (two) times daily. 30 g 0  . Evolocumab (REPATHA SURECLICK) 782 MG/ML SOAJ Inject 140 mg into the skin every 14 (fourteen) days. 2 pen 11  . fenofibrate (TRICOR) 145 MG tablet Take 145 mg by mouth daily.    . folic acid (FOLVITE) 1 MG tablet Take 2 mg by mouth daily.     . furosemide (LASIX) 40 MG tablet TAKE 1 TABLET BY MOUTH TWICE DAILY 180 tablet 2  . gabapentin (NEURONTIN) 300 MG capsule Take 300 mg by mouth at bedtime.   2  . hydrocortisone (ANUSOL-HC) 2.5 % rectal cream Place 1 application rectally 2 (two) times daily as needed for hemorrhoids or itching. 30 g 0  . insulin lispro protamine-lispro (HUMALOG 50/50 MIX) (50-50) 100 UNIT/ML SUSP injection Inject 40-50 Units into the skin See admin  instructions. Takes 50 units in am, 40 units midday, 50 units in pm    . leucovorin (WELLCOVORIN) 10 MG tablet Take 10 mg by mouth See admin instructions. Take 1 tablet (10 mg) by mouth every 12 hours and 24 hours after the methotrexate dose    . LUMIGAN 0.01 % SOLN Place 1 drop into both eyes at bedtime.   0  . magnesium oxide (MAG-OX) 400 (241.3 Mg) MG tablet Take 1 tablet (400 mg total) by mouth 2 (two) times daily. (Patient taking differently: Take 400 mg by mouth daily. ) 60 tablet 0  . Methotrexate Sodium (METHOTREXATE, PF,) 50 MG/2ML injection Inject 15 mg into the muscle every Friday.   0  . Misc Natural Products (OSTEO BI-FLEX  ADV DOUBLE ST PO) Take 1 tablet by mouth daily as needed (takes occassionally).     Marland Kitchen omega-3 acid ethyl esters (LOVAZA) 1 g capsule Take 1 capsule by mouth 2 (two) times daily.  11  . omeprazole (PRILOSEC) 20 MG capsule Take 20 mg by mouth daily as needed (heartburn or acid reflux).    Marland Kitchen PARoxetine (PAXIL) 40 MG tablet Take 40 mg by mouth daily.   11  . potassium chloride SA (K-DUR,KLOR-CON) 20 MEQ tablet 2 BY MOUTH DAILY 180 tablet 1  . predniSONE (DELTASONE) 5 MG tablet Take 5 mg by mouth daily with breakfast.    . RANEXA 500 MG 12 hr tablet TAKE 1 TABLET(500 MG) BY MOUTH TWICE DAILY 180 tablet 2  . sulfaSALAzine (AZULFIDINE) 500 MG tablet Take 500 mg by mouth daily.    . tamsulosin (FLOMAX) 0.4 MG CAPS capsule Take 0.4 mg by mouth daily.   4  . tiZANidine (ZANAFLEX) 4 MG tablet Take 1 tablet by mouth every 8 (eight) hours as needed for muscle spasms.   2  . topiramate (TOPAMAX) 25 MG tablet Take 25 mg by mouth 2 (two) times daily.    . traMADol (ULTRAM) 50 MG tablet Take 1 tablet by mouth 3 (three) times daily as needed (pain). Take as directed  2  . VITAMIN D, ERGOCALCIFEROL, PO Take 5,000 Units by mouth daily.     Marland Kitchen ZETIA 10 MG tablet Take 10 mg by mouth daily.   11   No current facility-administered medications for this visit.     Allergies:   Statins;  Ace inhibitors; Atenolol; Contrast media [iodinated diagnostic agents]; and Penicillins    Social History:  The patient  reports that he has never smoked. He has never used smokeless tobacco. He reports that he does not drink alcohol or use drugs.   Family History:  The patient's family history includes Heart attack in his brother and father; Heart failure in his mother; Hyperlipidemia in his mother; Hypertension in his mother.    ROS:  Please see the history of present illness.   Otherwise, review of systems are positive for sinus congestion.   All other systems are reviewed and negative.    PHYSICAL EXAM: VS:  BP 110/60   Pulse 87   Ht 5\' 11"  (1.803 m)   Wt 103.9 kg (229 lb)   SpO2 96%   BMI 31.94 kg/m  , BMI Body mass index is 31.94 kg/m. GENERAL:  Well appearing.  No acute distress HEENT:  Pupils equal round and reactive, fundi not visualized, oral mucosa unremarkable NECK:  No JVD.  Waveform within normal limits, carotid upstroke brisk and symmetric, no bruits LYMPHATICS:  No cervical adenopathy LUNGS:  Mild bibasilar crackles HEART:  Irregularly irregular.  PMI not displaced or sustained,S1 and S2 within normal limits, no S3, no S4, no clicks, no rubs, no murmurs ABD:  Flat, positive bowel sounds normal in frequency in pitch, no bruits, no rebound, no guarding, no midline pulsatile mass, no hepatomegaly, no splenomegaly EXT:  2 plus pulses throughout, no edema, no cyanosis no clubbing SKIN:  No rashes no nodules NEURO:  Cranial nerves II through XII grossly intact, motor grossly intact throughout PSYCH:  Cognitively intact, oriented to person place and time   EKG:  EKG is not ordered today. The ekg ordered 05/22/16 demonstrates atrial fibrillation rate 68 bpm.   09/28/16: Atrial fibrillation rate 84 bpm.  Non-specific ST changes.  Qt prolongation.    Echo 07/20/16: Study  Conclusions  - Left ventricle: The cavity size was normal. Wall thickness was   increased in a pattern  of mild LVH. Systolic function was normal.   The estimated ejection fraction was in the range of 50% to 55%.   There is akinesis of the basalinferior myocardium. There is   akinesis of the inferolateral myocardium. Features are consistent   with a pseudonormal left ventricular filling pattern, with   concomitant abnormal relaxation and increased filling pressure   (grade 2 diastolic dysfunction). Doppler parameters are   consistent with high ventricular filling pressure. - Left atrium: The atrium was mildly dilated. - Right atrium: The atrium was mildly dilated.  Impressions:  - Akinesis of the basal inferior wall and inferior lateral wall;   overall low normal LV systolic function; grade 2 diastolic   dysfunction with elevated LV filling pressure; mild biatrial   enlargment; trace MR and TR.  LHC 10/03/16: Ost Cx to Dist Cx lesion, 65 %stenosed.  2nd Mrg lesion, 75 %stenosed.  Dist Cx lesion, 80 %stenosed.  Dist LAD lesion, 90 %stenosed.  Mid LAD lesion, 80 %stenosed.  Prox LAD to Mid LAD lesion, 70 %stenosed.   1st Diag lesion, 75 %stenosed.  1st RPLB lesion, 100 %stenosed.  Prox RCA to Dist RCA lesion, 100 %stenosed.  LV end diastolic pressure is mildly elevated.    Severe diffuse three-vessel coronary disease in this patient with multiple prior stents.  Total occlusion of the right coronary within the ostial segment. Distal vessel fills by collaterals from the left coronary. The distal right coronary is small and may not be graftable.  Severe diffuse LAD disease with 70% proximal stenosis, 80% mid stenosis, and 90% apical stenosis. The first diagonal which is relatively small contains segmental 70-80% stenosis.  Heavily stented proximal to mid circumflex coronary artery with 50-70% in-stent restenosis, diffuse. Large second obtuse marginal with 75% proximal diffuse narrowing. 70-80% segmental narrowing in the mid circumflex after the origin of the second obtuse  marginal.  Mildly elevated LV EDP. Recent echo demonstrating EF greater than 50%. Contrast LV gram not performed due to chronic kidney disease. Total contrast used was 80 cc.  Recent Labs: 07/19/2016: Magnesium 2.1; TSH 1.209 03/21/2017: ALT 14; BUN 27; Creat 1.69; Hemoglobin 12.8; Platelets 347; Potassium 4.4; Sodium 142    Lipid Panel    Component Value Date/Time   CHOL 179 03/21/2017 1211   TRIG 144 03/21/2017 1211   HDL 49 03/21/2017 1211   CHOLHDL 3.7 03/21/2017 1211   VLDL 29 03/21/2017 1211   LDLCALC 101 (H) 03/21/2017 1211      Wt Readings from Last 3 Encounters:  06/19/17 103.9 kg (229 lb)  03/21/17 100.3 kg (221 lb 3.2 oz)  03/11/17 98.9 kg (218 lb)      ASSESSMENT AND PLAN:  # Acute on chronic systolic and diastolic heart failure:   LVEF improved to 50-55%.  Julian Ross has been doing well and is euvolemic.  Continue carvedilol and Lasix. He is not on an Ace inhibitor/ARB due to renal dysfunction.  # CAD s/p PCI: # CCS Class III angina:  Julian Ross has a history of known CAD s/p multiple PCIs.  His most recent cath revealed three vessel CAD.  He was referred to Dr. [name] for CABG, but continues to do very well now with medical management. Plan for surgery only if he worsens clinically.  Continue carvedilol and ranolazine. He has not tolerated statins and was recently started on Repatha due to his LDL  of 101.  His father had carotid stenosis. We will get carotid Dopplers to rule out obstructive carotid disease.  # Hypertriglyceridemia: Controlled on fenofibrate and Lovaza.  # Hypertension/hypotension: BP well-controlled.  Continue carvedilol.  # Atrial fibrillation: He remains in atrial fibrillation after cardioversion but rates are well-controlled.  Continue Eliquis and carvedilol.  his patients CHA2DS2-VASc Score and unadjusted Ischemic Stroke Rate (% per year) is equal to 7.2 % stroke rate/year from a score of 5  Above score calculated as 1 point each if present  [CHF, HTN, DM, Vascular=MI/PAD/Aortic Plaque, Age if 65-74, or Male] Above score calculated as 2 points each if present [Age > 75, or Stroke/TIA/TE]    Current medicines are reviewed at length with the patient today.  The patient does not have concerns regarding medicines.  The following changes have been made: none  Labs/ tests ordered today include:   No orders of the defined types were placed in this encounter.    Disposition:   FU with Eiza Canniff C. [name], MD, [facility] in 6 months.    This note was written with the assistance of speech recognition software.  Please excuse any transcriptional errors.  Signed, Mariyam Remington C. [name], MD, [facility]  06/19/2017 1:05 PM    [facility]

## 2017-06-19 NOTE — Patient Instructions (Signed)
Medication Instructions:  Your physician recommends that you continue on your current medications as directed. Please refer to the Current Medication list given to you today.  Labwork: none  Testing/Procedures: Your physician has requested that you have a carotid duplex. This test is an ultrasound of the carotid arteries in your neck. It looks at blood flow through these arteries that supply the brain with blood. Allow one hour for this exam. There are no restrictions or special instructions.  Follow-Up: Your physician wants you to follow-up in: 6 month ov You will receive a reminder letter in the mail two months in advance. If you don't receive a letter, please call our office to schedule the follow-up appointment.  If you need a refill on your cardiac medications before your next appointment, please call your pharmacy.  

## 2017-06-24 DIAGNOSIS — H5213 Myopia, bilateral: Secondary | ICD-10-CM | POA: Diagnosis not present

## 2017-06-24 DIAGNOSIS — H52203 Unspecified astigmatism, bilateral: Secondary | ICD-10-CM | POA: Diagnosis not present

## 2017-06-24 DIAGNOSIS — H401132 Primary open-angle glaucoma, bilateral, moderate stage: Secondary | ICD-10-CM | POA: Diagnosis not present

## 2017-06-24 DIAGNOSIS — H26491 Other secondary cataract, right eye: Secondary | ICD-10-CM | POA: Diagnosis not present

## 2017-07-01 ENCOUNTER — Other Ambulatory Visit: Payer: Self-pay | Admitting: Adult Health

## 2017-07-02 ENCOUNTER — Other Ambulatory Visit: Payer: Self-pay | Admitting: Cardiovascular Disease

## 2017-07-02 DIAGNOSIS — I6523 Occlusion and stenosis of bilateral carotid arteries: Secondary | ICD-10-CM

## 2017-07-12 ENCOUNTER — Inpatient Hospital Stay (HOSPITAL_COMMUNITY): Admission: RE | Admit: 2017-07-12 | Payer: Medicare Other | Source: Ambulatory Visit

## 2017-07-15 DIAGNOSIS — M9904 Segmental and somatic dysfunction of sacral region: Secondary | ICD-10-CM | POA: Diagnosis not present

## 2017-07-15 DIAGNOSIS — M9902 Segmental and somatic dysfunction of thoracic region: Secondary | ICD-10-CM | POA: Diagnosis not present

## 2017-07-15 DIAGNOSIS — S332XXA Dislocation of sacroiliac and sacrococcygeal joint, initial encounter: Secondary | ICD-10-CM | POA: Diagnosis not present

## 2017-07-15 DIAGNOSIS — S23130A Subluxation of T4/T5 thoracic vertebra, initial encounter: Secondary | ICD-10-CM | POA: Diagnosis not present

## 2017-07-23 ENCOUNTER — Ambulatory Visit (HOSPITAL_COMMUNITY)
Admission: RE | Admit: 2017-07-23 | Discharge: 2017-07-23 | Disposition: A | Discharge status: Home / Self Care | Payer: Medicare Other | Source: Ambulatory Visit | Attending: Internal Medicine | Admitting: Internal Medicine

## 2017-07-23 DIAGNOSIS — S332XXA Dislocation of sacroiliac and sacrococcygeal joint, initial encounter: Secondary | ICD-10-CM | POA: Diagnosis not present

## 2017-07-23 DIAGNOSIS — I6523 Occlusion and stenosis of bilateral carotid arteries: Secondary | ICD-10-CM | POA: Diagnosis not present

## 2017-07-23 DIAGNOSIS — M9902 Segmental and somatic dysfunction of thoracic region: Secondary | ICD-10-CM | POA: Diagnosis not present

## 2017-07-23 DIAGNOSIS — S23130A Subluxation of T4/T5 thoracic vertebra, initial encounter: Secondary | ICD-10-CM | POA: Diagnosis not present

## 2017-07-23 DIAGNOSIS — M9904 Segmental and somatic dysfunction of sacral region: Secondary | ICD-10-CM | POA: Diagnosis not present

## 2017-07-25 DIAGNOSIS — S23130A Subluxation of T4/T5 thoracic vertebra, initial encounter: Secondary | ICD-10-CM | POA: Diagnosis not present

## 2017-07-25 DIAGNOSIS — S332XXA Dislocation of sacroiliac and sacrococcygeal joint, initial encounter: Secondary | ICD-10-CM | POA: Diagnosis not present

## 2017-07-25 DIAGNOSIS — M9902 Segmental and somatic dysfunction of thoracic region: Secondary | ICD-10-CM | POA: Diagnosis not present

## 2017-07-25 DIAGNOSIS — M9904 Segmental and somatic dysfunction of sacral region: Secondary | ICD-10-CM | POA: Diagnosis not present

## 2017-08-07 DIAGNOSIS — G4733 Obstructive sleep apnea (adult) (pediatric): Secondary | ICD-10-CM | POA: Diagnosis not present

## 2017-08-07 DIAGNOSIS — J84112 Idiopathic pulmonary fibrosis: Secondary | ICD-10-CM | POA: Diagnosis not present

## 2017-08-13 DIAGNOSIS — M9904 Segmental and somatic dysfunction of sacral region: Secondary | ICD-10-CM | POA: Diagnosis not present

## 2017-08-13 DIAGNOSIS — S23130A Subluxation of T4/T5 thoracic vertebra, initial encounter: Secondary | ICD-10-CM | POA: Diagnosis not present

## 2017-08-13 DIAGNOSIS — M9902 Segmental and somatic dysfunction of thoracic region: Secondary | ICD-10-CM | POA: Diagnosis not present

## 2017-08-13 DIAGNOSIS — S332XXA Dislocation of sacroiliac and sacrococcygeal joint, initial encounter: Secondary | ICD-10-CM | POA: Diagnosis not present

## 2017-08-28 ENCOUNTER — Other Ambulatory Visit: Payer: Self-pay | Admitting: Endocrinology

## 2017-08-28 DIAGNOSIS — Z6832 Body mass index (BMI) 32.0-32.9, adult: Secondary | ICD-10-CM | POA: Diagnosis not present

## 2017-08-28 DIAGNOSIS — E784 Other hyperlipidemia: Secondary | ICD-10-CM | POA: Diagnosis not present

## 2017-08-28 DIAGNOSIS — Z23 Encounter for immunization: Secondary | ICD-10-CM | POA: Diagnosis not present

## 2017-08-28 DIAGNOSIS — E559 Vitamin D deficiency, unspecified: Secondary | ICD-10-CM | POA: Diagnosis not present

## 2017-08-28 DIAGNOSIS — R05 Cough: Secondary | ICD-10-CM

## 2017-08-28 DIAGNOSIS — H61101 Unspecified noninfective disorders of pinna, right ear: Secondary | ICD-10-CM | POA: Diagnosis not present

## 2017-08-28 DIAGNOSIS — E1142 Type 2 diabetes mellitus with diabetic polyneuropathy: Secondary | ICD-10-CM | POA: Diagnosis not present

## 2017-08-28 DIAGNOSIS — N183 Chronic kidney disease, stage 3 (moderate): Secondary | ICD-10-CM | POA: Diagnosis not present

## 2017-08-28 DIAGNOSIS — E1129 Type 2 diabetes mellitus with other diabetic kidney complication: Secondary | ICD-10-CM | POA: Diagnosis not present

## 2017-08-28 DIAGNOSIS — Z1389 Encounter for screening for other disorder: Secondary | ICD-10-CM | POA: Diagnosis not present

## 2017-08-28 DIAGNOSIS — N401 Enlarged prostate with lower urinary tract symptoms: Secondary | ICD-10-CM | POA: Diagnosis not present

## 2017-08-28 DIAGNOSIS — G4733 Obstructive sleep apnea (adult) (pediatric): Secondary | ICD-10-CM | POA: Diagnosis not present

## 2017-08-28 DIAGNOSIS — I779 Disorder of arteries and arterioles, unspecified: Secondary | ICD-10-CM | POA: Diagnosis not present

## 2017-08-28 DIAGNOSIS — R053 Chronic cough: Secondary | ICD-10-CM

## 2017-08-30 ENCOUNTER — Ambulatory Visit
Admission: RE | Admit: 2017-08-30 | Discharge: 2017-08-30 | Disposition: A | Discharge status: Home / Self Care | Payer: Medicare Other | Source: Ambulatory Visit | Attending: Endocrinology | Admitting: Endocrinology

## 2017-08-30 DIAGNOSIS — R053 Chronic cough: Secondary | ICD-10-CM

## 2017-08-30 DIAGNOSIS — I700 Atherosclerosis of aorta: Secondary | ICD-10-CM | POA: Diagnosis not present

## 2017-08-30 DIAGNOSIS — R05 Cough: Secondary | ICD-10-CM

## 2017-08-30 DIAGNOSIS — I517 Cardiomegaly: Secondary | ICD-10-CM | POA: Diagnosis not present

## 2017-09-03 DIAGNOSIS — M9902 Segmental and somatic dysfunction of thoracic region: Secondary | ICD-10-CM | POA: Diagnosis not present

## 2017-09-03 DIAGNOSIS — M9904 Segmental and somatic dysfunction of sacral region: Secondary | ICD-10-CM | POA: Diagnosis not present

## 2017-09-03 DIAGNOSIS — S23132A Subluxation of T5/T6 thoracic vertebra, initial encounter: Secondary | ICD-10-CM | POA: Diagnosis not present

## 2017-09-03 DIAGNOSIS — S332XXA Dislocation of sacroiliac and sacrococcygeal joint, initial encounter: Secondary | ICD-10-CM | POA: Diagnosis not present

## 2017-09-03 DIAGNOSIS — Z23 Encounter for immunization: Secondary | ICD-10-CM | POA: Diagnosis not present

## 2017-09-04 DIAGNOSIS — C44212 Basal cell carcinoma of skin of right ear and external auricular canal: Secondary | ICD-10-CM | POA: Diagnosis not present

## 2017-09-04 DIAGNOSIS — C4441 Basal cell carcinoma of skin of scalp and neck: Secondary | ICD-10-CM | POA: Diagnosis not present

## 2017-09-04 DIAGNOSIS — L218 Other seborrheic dermatitis: Secondary | ICD-10-CM | POA: Diagnosis not present

## 2017-09-07 ENCOUNTER — Other Ambulatory Visit: Payer: Self-pay | Admitting: Cardiovascular Disease

## 2017-09-09 NOTE — Telephone Encounter (Signed)
Refill Request.  

## 2017-09-12 DIAGNOSIS — M130 Polyarthritis, unspecified: Secondary | ICD-10-CM | POA: Diagnosis not present

## 2017-09-12 DIAGNOSIS — E559 Vitamin D deficiency, unspecified: Secondary | ICD-10-CM | POA: Diagnosis not present

## 2017-09-12 DIAGNOSIS — Z111 Encounter for screening for respiratory tuberculosis: Secondary | ICD-10-CM | POA: Diagnosis not present

## 2017-09-12 DIAGNOSIS — G629 Polyneuropathy, unspecified: Secondary | ICD-10-CM | POA: Diagnosis not present

## 2017-09-12 DIAGNOSIS — Z79899 Other long term (current) drug therapy: Secondary | ICD-10-CM | POA: Diagnosis not present

## 2017-09-12 DIAGNOSIS — M0579 Rheumatoid arthritis with rheumatoid factor of multiple sites without organ or systems involvement: Secondary | ICD-10-CM | POA: Diagnosis not present

## 2017-09-26 DIAGNOSIS — M9904 Segmental and somatic dysfunction of sacral region: Secondary | ICD-10-CM | POA: Diagnosis not present

## 2017-09-26 DIAGNOSIS — S23132A Subluxation of T5/T6 thoracic vertebra, initial encounter: Secondary | ICD-10-CM | POA: Diagnosis not present

## 2017-09-26 DIAGNOSIS — M9902 Segmental and somatic dysfunction of thoracic region: Secondary | ICD-10-CM | POA: Diagnosis not present

## 2017-09-26 DIAGNOSIS — S332XXA Dislocation of sacroiliac and sacrococcygeal joint, initial encounter: Secondary | ICD-10-CM | POA: Diagnosis not present

## 2017-10-01 DIAGNOSIS — Z85828 Personal history of other malignant neoplasm of skin: Secondary | ICD-10-CM | POA: Diagnosis not present

## 2017-10-01 DIAGNOSIS — C44212 Basal cell carcinoma of skin of right ear and external auricular canal: Secondary | ICD-10-CM | POA: Diagnosis not present

## 2017-10-03 DIAGNOSIS — Z6832 Body mass index (BMI) 32.0-32.9, adult: Secondary | ICD-10-CM | POA: Diagnosis not present

## 2017-10-03 DIAGNOSIS — I10 Essential (primary) hypertension: Secondary | ICD-10-CM | POA: Diagnosis not present

## 2017-10-03 DIAGNOSIS — Z794 Long term (current) use of insulin: Secondary | ICD-10-CM | POA: Diagnosis not present

## 2017-10-03 DIAGNOSIS — N183 Chronic kidney disease, stage 3 (moderate): Secondary | ICD-10-CM | POA: Diagnosis not present

## 2017-10-03 DIAGNOSIS — E1129 Type 2 diabetes mellitus with other diabetic kidney complication: Secondary | ICD-10-CM | POA: Diagnosis not present

## 2017-10-29 ENCOUNTER — Telehealth: Payer: Self-pay | Admitting: *Deleted

## 2017-10-29 NOTE — Telephone Encounter (Signed)
SPOKE TO PATIENT. PATIENT STATES HE HAS BEEN CHEST TIGHTNESS LAST 3- 5 DAYS. NONE AT PRESENT TIME. PATIENT AWARE IF SYMPTOMS ARE OCCURRING NOW WILL OFFER TO SEND TO EMERGENCY ROOM PER PROTOCOL. OFFERED APPOINTMENT-- AVAILABLE TO TOMMORROW  8:30 AM. PATIENT  AWARE AND IN AGREEMENT

## 2017-10-30 ENCOUNTER — Encounter: Payer: Self-pay | Admitting: Cardiology

## 2017-10-30 ENCOUNTER — Ambulatory Visit (INDEPENDENT_AMBULATORY_CARE_PROVIDER_SITE_OTHER): Payer: Medicare Other | Admitting: Cardiology

## 2017-10-30 VITALS — BP 114/66 | HR 76 | Resp 16 | Ht 71.0 in | Wt 237.0 lb

## 2017-10-30 DIAGNOSIS — R0602 Shortness of breath: Secondary | ICD-10-CM | POA: Diagnosis not present

## 2017-10-30 DIAGNOSIS — Z79899 Other long term (current) drug therapy: Secondary | ICD-10-CM | POA: Diagnosis not present

## 2017-10-30 DIAGNOSIS — I5042 Chronic combined systolic (congestive) and diastolic (congestive) heart failure: Secondary | ICD-10-CM

## 2017-10-30 DIAGNOSIS — I6523 Occlusion and stenosis of bilateral carotid arteries: Secondary | ICD-10-CM

## 2017-10-30 MED ORDER — FUROSEMIDE 80 MG PO TABS: 80.0000 mg | ORAL_TABLET | Freq: Two times a day (BID) | ORAL | 3 refills | Status: DC

## 2017-10-30 MED ORDER — ISOSORBIDE MONONITRATE ER 30 MG PO TB24: 15.0000 mg | ORAL_TABLET | Freq: Every day | ORAL | 3 refills | Status: DC

## 2017-10-30 NOTE — Patient Instructions (Signed)
Medication Instructions:  START- Isosorbide 15 mg daily INCREASE- Lasix 80 mg twice a day  If you need a refill on your cardiac medications before your next appointment, please call your pharmacy.  Labwork: BMP, BNP, CBC Today HERE IN OUR OFFICE AT [facility]  Testing/Procedures: Your physician has requested that you have an echocardiogram. Echocardiography is a painless test that uses sound waves to create images of your heart. It provides your doctor with information about the size and shape of your heart and how well your heart's chambers and valves are working. This procedure takes approximately one hour. There are no restrictions for this procedure.   Follow-Up: Your physician wants you to follow-up in: 1 week with Dr [name] or Kerin Ransom.   Thank you for choosing [facility] at [facility]!!

## 2017-10-30 NOTE — Progress Notes (Signed)
10/30/2017 Julian Ross   1946-08-15  [id number]  Primary Physician Reynold Bowen, MD Primary Cardiologist: Dr [name]  HPI: 71 y.o. male with chronic systolic and diastolic heart failure, permanentl atrial fibrillation, CAD -s/p multiple PCIs, ( previously had 16 stents placed in [place], [place]). LHC  10/03/16 showed diffuse, three-vessel coronary disease with multiple areas of prior stenting. He has been evaluated by Dr [name] and recommendation was made for medical therapy, with consideration CABG if this failed. LOV with Dr [name] was June 2018 and he was doing well.   He is seen in the offcie today with comploaints of increasing DOE and vague chest discomfort. He says his wgt had been closer to 210- now 232 at   Current Outpatient Prescriptions  Medication Sig Dispense Refill  . acetaminophen (TYLENOL) 325 MG tablet Take 650 mg by mouth every 4 (four) hours as needed for mild pain or headache.    . ALPRAZolam (NIRAVAM) 0.5 MG dissolvable tablet Take 1 tablet by mouth as needed (Take as directed).   2  . apixaban (ELIQUIS) 5 MG TABS tablet Take 1 tablet (5 mg total) by mouth 2 (two) times daily. 60 tablet   . carvedilol (COREG) 6.25 MG tablet Take 1 tablet (6.25 mg total) by mouth 2 (two) times daily with a meal. 180 tablet 3  . clotrimazole (LOTRIMIN) 1 % cream Apply 1 application topically 2 (two) times daily. 30 g 0  . Evolocumab (REPATHA SURECLICK) 629 MG/ML SOAJ Inject 140 mg into the skin every 14 (fourteen) days. 2 pen 11  . fenofibrate (TRICOR) 145 MG tablet Take 145 mg by mouth daily.    . folic acid (FOLVITE) 1 MG tablet Take 2 mg by mouth daily.     . furosemide (LASIX) 40 MG tablet TAKE 1 TABLET BY MOUTH TWICE DAILY 180 tablet 2  . gabapentin (NEURONTIN) 300 MG capsule Take 300 mg by mouth at bedtime.   2  . hydrocortisone (ANUSOL-HC) 2.5 % rectal cream Place 1 application rectally 2 (two) times daily as needed for hemorrhoids or itching. 30 g 0  . insulin lispro  protamine-lispro (HUMALOG 50/50 MIX) (50-50) 100 UNIT/ML SUSP injection Inject 40-50 Units into the skin See admin instructions. Takes 50 units in am, 40 units midday, 50 units in pm    . leucovorin (WELLCOVORIN) 10 MG tablet Take 10 mg by mouth See admin instructions. Take 1 tablet (10 mg) by mouth every 12 hours and 24 hours after the methotrexate dose    . LUMIGAN 0.01 % SOLN Place 1 drop into both eyes at bedtime.   0  . magnesium oxide (MAG-OX) 400 (241.3 Mg) MG tablet Take 1 tablet (400 mg total) by mouth 2 (two) times daily. (Patient taking differently: Take 400 mg by mouth daily. ) 60 tablet 0  . Methotrexate Sodium (METHOTREXATE, PF,) 50 MG/2ML injection Inject 15 mg into the muscle every Friday.   0  . Misc Natural Products (OSTEO BI-FLEX ADV DOUBLE ST PO) Take 1 tablet by mouth daily as needed (takes occassionally).     Marland Kitchen omega-3 acid ethyl esters (LOVAZA) 1 g capsule Take 1 capsule by mouth 2 (two) times daily.  11  . omeprazole (PRILOSEC) 20 MG capsule Take 20 mg by mouth daily as needed (heartburn or acid reflux).    Marland Kitchen PARoxetine (PAXIL) 40 MG tablet Take 40 mg by mouth daily.   11  . potassium chloride SA (K-DUR,KLOR-CON) 20 MEQ tablet TAKE 2 TABLETS BY MOUTH DAILY 180 tablet  3  . predniSONE (DELTASONE) 5 MG tablet Take 5 mg by mouth daily with breakfast.    . RANEXA 500 MG 12 hr tablet TAKE 1 TABLET(500 MG) BY MOUTH TWICE DAILY 180 tablet 2  . sulfaSALAzine (AZULFIDINE) 500 MG tablet Take 500 mg by mouth daily.    . tamsulosin (FLOMAX) 0.4 MG CAPS capsule Take 0.4 mg by mouth daily.   4  . tiZANidine (ZANAFLEX) 4 MG tablet Take 1 tablet by mouth every 8 (eight) hours as needed for muscle spasms.   2  . topiramate (TOPAMAX) 25 MG tablet Take 25 mg by mouth 2 (two) times daily.    . traMADol (ULTRAM) 50 MG tablet Take 1 tablet by mouth 3 (three) times daily as needed (pain). Take as directed  2  . VITAMIN D, ERGOCALCIFEROL, PO Take 5,000 Units by mouth daily.     Marland Kitchen ZETIA 10 MG tablet  Take 10 mg by mouth daily.   11   No current facility-administered medications for this visit.     Allergies  Allergen Reactions  . Statins Other (See Comments)    Causes stiffness in joints   . Ace Inhibitors Cough  . Atenolol Other (See Comments)    Unknown reaction  . Contrast Media [Iodinated Diagnostic Agents] Rash    Has to take benadryl prior to use  . Penicillins Hives and Rash    Has patient had a PCN reaction causing immediate rash, facial/tongue/throat swelling, SOB or lightheadedness with hypotension: Yes Has patient had a PCN reaction causing severe rash involving mucus membranes or skin necrosis: No Has patient had a PCN reaction that required hospitalization pt was in the hospital at time of last reaction - heart attack Has patient had a PCN reaction occurring within the last 10 years: No If all of the above answers are "NO", then may proceed with Cephalosporin use.    Past Medical History:  Diagnosis Date  . Anemia   . Anxiety state   . CAD (coronary artery disease)    16 prior stents (at [facility])  . Cardiomyopathy, ischemic   . Chronic lower back pain   . Chronic systolic CHF (congestive heart failure) ([place])   . CKD (chronic kidney disease), stage III   . Depression   . Diabetes mellitus type 2 in obese ([place])   . Fibromyalgia   . Hepatitis A    at age 73  . High cholesterol   . History of blood transfusion    "related to OR"  . Hypertension   . Hypokalemia   . MI (myocardial infarction) ([place]) 1990; 1995; 1997  . OSA on CPAP   . Osteoarthritis   . Persistent atrial fibrillation ([facility]) 02/2016   occured in setting of pneumonia  . Rheumatoid arthritis of multiple sites with negative rheumatoid factor ([place])   . Septic shock ([place]) 02/2016   in setting of severe pneumonia    Social History   Social History  . Marital status: Married    Spouse name: N/A  . Number of children: N/A  . Years of education: N/A   Occupational History  . Not  on file.   Social History Main Topics  . Smoking status: Never Smoker  . Smokeless tobacco: Never Used  . Alcohol use No  . Drug use: No  . Sexual activity: Not Currently   Other Topics Concern  . Not on file   Social History Narrative   Micheal Likens, Dentist retired on disability.  Lived in  [place] but recently moved to [place].     Family History  Problem Relation Age of Onset  . Heart failure Mother   . Hyperlipidemia Mother   . Hypertension Mother   . Heart attack Father   . Heart attack Brother      Review of Systems: General: negative for chills, fever, night sweats or weight changes.  Cardiovascular: negative for chest pain, dyspnea on exertion, edema, orthopnea, palpitations, paroxysmal nocturnal dyspnea or shortness of breath Dermatological: negative for rash Respiratory: negative for cough or wheezing Urologic: negative for hematuria Abdominal: negative for nausea, vomiting, diarrhea, bright red blood per rectum, melena, or hematemesis Neurologic: negative for visual changes, syncope, or dizziness All other systems reviewed and are otherwise negative except as noted above.    There were no vitals taken for this visit.  General appearance: alert, cooperative, appears stated age, no distress and pale Neck: JVD noted Lungs: bi blasilar crackles 1/3 up Heart: irregularly irregular rhythm Extremities: trace pre tibial edema Skin: pale, cool, dry Neurologic: Grossly normal  EKG AF- rate 57  ASSESSMENT AND PLAN:   # Acute on chronic systolic and diastolic heart failure:   LVEF was 50-55% July 2017- thouh he was in NSR then. He is not on an Ace inhibitor/ARB due to renal dysfunction. I am concerned his EF has deteriorated in AF. He ap[ears to be 15-20 lbs up.  # CAD s/p PCI: H/O multiple PCI's in [place]. Cath Oct 2017- severe disease. Medical Rx for now, plan for surgery only if he worsens clinically.  # Dyslipidemia:  Statin intolerant- Repatha  #  Hypertension/hypotension: BP well-controlled.  Continue carvedilol.  # Atrial fibrillation: He remains in atrial fibrillation, rates are well-controlled.  Continue Eliquis and carvedilol.  his patients CHA2DS2-VASc Score and unadjusted Ischemic Stroke Rate (% per year) is equal to 7.2 % stroke rate/year from a score of 5   PLAN  I suggested Dr [name] increase his Lasix to 80 mg BID. I also added Imdur 15 mg (he has had issues with orthostatic hypotension in the past) and ordered an echo, BNP, BMP, and CBC. He'll need to be seen after his echo next week. I told him he may need to be admitted if he is not improved.   Kerin Ransom PA-C 10/30/2017 9:00 AM

## 2017-10-31 LAB — BASIC METABOLIC PANEL
BUN/Creatinine Ratio: 13 (ref 10–24)
BUN: 31 mg/dL — ABNORMAL HIGH (ref 8–27)
CO2: 22 mmol/L (ref 20–29)
Calcium: 9.3 mg/dL (ref 8.6–10.2)
Chloride: 97 mmol/L (ref 96–106)
Creatinine, Ser: 2.39 mg/dL — ABNORMAL HIGH (ref 0.76–1.27)
GFR calc Af Amer: 30 mL/min/{1.73_m2} — ABNORMAL LOW (ref >59)
GFR calc non Af Amer: 26 mL/min/{1.73_m2} — ABNORMAL LOW (ref >59)
Glucose: 151 mg/dL — ABNORMAL HIGH (ref 65–99)
Potassium: 3.8 mmol/L (ref 3.5–5.2)
Sodium: 138 mmol/L (ref 134–144)

## 2017-10-31 LAB — CBC
Hematocrit: 35.8 % — ABNORMAL LOW (ref 37.5–51.0)
Hemoglobin: 11.7 g/dL — ABNORMAL LOW (ref 13.0–17.7)
MCH: 31.5 pg (ref 26.6–33.0)
MCHC: 32.7 g/dL (ref 31.5–35.7)
MCV: 97 fL (ref 79–97)
Platelets: 269 10*3/uL (ref 150–379)
RBC: 3.71 x10E6/uL — ABNORMAL LOW (ref 4.14–5.80)
RDW: 14.1 % (ref 12.3–15.4)
WBC: 7.6 10*3/uL (ref 3.4–10.8)

## 2017-10-31 LAB — BRAIN NATRIURETIC PEPTIDE: BNP: 229.8 pg/mL — ABNORMAL HIGH (ref 0.0–100.0)

## 2017-11-04 ENCOUNTER — Other Ambulatory Visit: Payer: Self-pay

## 2017-11-04 ENCOUNTER — Ambulatory Visit (HOSPITAL_COMMUNITY): Payer: Medicare Other | Attending: Cardiology

## 2017-11-04 DIAGNOSIS — I081 Rheumatic disorders of both mitral and tricuspid valves: Secondary | ICD-10-CM | POA: Diagnosis not present

## 2017-11-04 DIAGNOSIS — I110 Hypertensive heart disease with heart failure: Secondary | ICD-10-CM | POA: Diagnosis not present

## 2017-11-04 DIAGNOSIS — I5042 Chronic combined systolic (congestive) and diastolic (congestive) heart failure: Secondary | ICD-10-CM | POA: Diagnosis not present

## 2017-11-04 DIAGNOSIS — R0600 Dyspnea, unspecified: Secondary | ICD-10-CM | POA: Diagnosis not present

## 2017-11-04 DIAGNOSIS — I272 Pulmonary hypertension, unspecified: Secondary | ICD-10-CM | POA: Diagnosis not present

## 2017-11-04 DIAGNOSIS — E785 Hyperlipidemia, unspecified: Secondary | ICD-10-CM | POA: Diagnosis not present

## 2017-11-05 ENCOUNTER — Encounter: Payer: Self-pay | Admitting: Cardiovascular Disease

## 2017-11-05 ENCOUNTER — Other Ambulatory Visit: Payer: Self-pay | Admitting: Cardiovascular Disease

## 2017-11-05 ENCOUNTER — Ambulatory Visit (INDEPENDENT_AMBULATORY_CARE_PROVIDER_SITE_OTHER): Payer: Medicare Other | Admitting: Cardiovascular Disease

## 2017-11-05 VITALS — BP 119/74 | HR 93 | Ht 71.0 in | Wt 232.4 lb

## 2017-11-05 DIAGNOSIS — I10 Essential (primary) hypertension: Secondary | ICD-10-CM | POA: Diagnosis not present

## 2017-11-05 DIAGNOSIS — I209 Angina pectoris, unspecified: Secondary | ICD-10-CM

## 2017-11-05 DIAGNOSIS — I4819 Other persistent atrial fibrillation: Secondary | ICD-10-CM

## 2017-11-05 DIAGNOSIS — I5043 Acute on chronic combined systolic (congestive) and diastolic (congestive) heart failure: Secondary | ICD-10-CM | POA: Diagnosis not present

## 2017-11-05 DIAGNOSIS — I481 Persistent atrial fibrillation: Secondary | ICD-10-CM | POA: Diagnosis not present

## 2017-11-05 DIAGNOSIS — E7800 Pure hypercholesterolemia, unspecified: Secondary | ICD-10-CM

## 2017-11-05 DIAGNOSIS — I6523 Occlusion and stenosis of bilateral carotid arteries: Secondary | ICD-10-CM | POA: Diagnosis not present

## 2017-11-05 DIAGNOSIS — Z5181 Encounter for therapeutic drug level monitoring: Secondary | ICD-10-CM | POA: Diagnosis not present

## 2017-11-05 MED ORDER — METOLAZONE 5 MG PO TABS: ORAL_TABLET | ORAL | 1 refills | Status: DC

## 2017-11-05 MED ORDER — POTASSIUM CHLORIDE CRYS ER 20 MEQ PO TBCR: 40.0000 meq | EXTENDED_RELEASE_TABLET | Freq: Two times a day (BID) | ORAL | 5 refills | Status: DC

## 2017-11-05 NOTE — Telephone Encounter (Signed)
Please review for refill, thanks ! 

## 2017-11-05 NOTE — Progress Notes (Signed)
Cardiology Office Note   Date:  11/05/2017   ID:  Julian Ross, DOB [birth year], MRN [id number]  PCP:  Julian Bowen, MD  Cardiologist:   Julian Latch, MD   Chief Complaint  Patient presents with  . Follow-up     History of Present Illness: Julian Ross is a 71 y.o. male with chronic systolic and diastolic heart failure LVEF improved from 30-35% to 50-55%, paroxysmal atrial fibrillation, CAD s/p multiple PCIs, who presents for follow up.  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  During that hospitalization he also had elevated troponin consistent with demand ischemia (troponin 0.13) and new onset atrial fibrillation with RVR.  He was diuresed to a discharge weight of 196 lb (from 208 lb on admit).  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  He previously had 16 stents placed in [place], [place].  His cardiologist there is Dr. Alroy Ross.  The inpatient team contacted Dr. Alroy Ross, who informed them that Julian Ross has a tight RCA lesion but no recent interventions.  He felt that Julian Ross could transition from Plavix to aspirin.  He has been intolerant to statins in the past.    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  After cardioversion he developed bradycardia and initially metoprolol was reduced.  Then he was switched from metoprolol to carvedilol.  This was further reduced to 6.25mg  bid on 06/19/16.  On 7/21 he had a recurrent episode of syncope. Hydralazine and isosorbide were discontinued.  Troponin was mildly elevated at 0.03.  CT of the head was negative, and chest x-ray showed no acute edema or infiltrate.  During that hospitalization he was noted to be orthostatic.  His SBP dropped to 80 when standing.  Carvedilol was reduced 1.5625 mg bid and his orthostasis was improved.  Echo that admission showed an improvement in his LVEF to 50-55%. He wore an event monitor 07/31/16 that  revealed short runs of atrial fibrillation as well as PVCs and PACs. He reported angina and underwent LHC  10/03/16 where he was noted to have severe, diffuse, three-vessel coronary disease with multiple areas of prior stenting. A recommendation was made for intensification of medical therapy or consideration of coronary artery bypass grafting. He was scheduled for CABG/MAZE but developed pneumonia requiring hospitalization 10/2016.  He had significant clinical improvement and a decision made was made to continue medical management unless his symptoms worsened.  He started on Repatha 03/2017.  Since his last appointment Dr. [name] had an episode of chest tightness.  He saw Julian Ransom, PA-C on 10/31.  His weight was up to 232 pounds from 210.  Lasix was increased to 80 mg twice daily and low-dose isosorbide nitrate was added.  He had a repeat echocardiogram 11/04/17 that revealed LVEF 45-50% with inferolateral and basal to mid inferior walls.  Since that appointment he continues to feel short of breath with exertion.   His weight is up considerably, but he has lost 5lb since increasing lasix.  He has seen improvement since increasing his Lasix but he feels that his urinary output has been slow.  His breathing is overall better and he no longer has orthopnea.  He does however have chest pressure, especially when walking.     Past Medical History:  Diagnosis Date  . Anemia   . Anxiety state   . CAD (coronary artery disease)    16 prior stents (at [facility])  .  Cardiomyopathy, ischemic   . Chronic lower back pain   . Chronic systolic CHF (congestive heart failure) ([facility])   . CKD (chronic kidney disease), stage III ([place])   . Depression   . Diabetes mellitus type 2 in obese ([place])   . Fibromyalgia   . Hepatitis A    at age 73  . High cholesterol   . History of blood transfusion    "related to OR"  . Hypertension   . Hypokalemia   . MI (myocardial infarction) ([facility]) 1990; 1995; 1997  . OSA on  CPAP   . Osteoarthritis   . Persistent atrial fibrillation ([facility]) 02/2016   occured in setting of pneumonia  . Rheumatoid arthritis of multiple sites with negative rheumatoid factor ([place])   . Septic shock ([facility]) 02/2016   in setting of severe pneumonia    Past Surgical History:  Procedure Laterality Date  . BACK SURGERY    . CATARACT EXTRACTION W/ INTRAOCULAR LENS  IMPLANT, BILATERAL Bilateral 2000s  . CORONARY ANGIOPLASTY  early 44s  . CORONARY ANGIOPLASTY WITH STENT PLACEMENT     "I've got 15 stents" (07/19/2016)  . KNEE ARTHROSCOPY Left 1990s  . POSTERIOR LUMBAR FUSION  2015   L4-5-S1  . right knee surgery     age of 67  . TONSILLECTOMY AND ADENOIDECTOMY  1949     Current Outpatient Medications  Medication Sig Dispense Refill  . acetaminophen (TYLENOL) 325 MG tablet Take 650 mg by mouth every 4 (four) hours as needed for mild pain or headache.    . ALPRAZolam (NIRAVAM) 0.5 MG dissolvable tablet Take 1 tablet by mouth as needed (Take as directed).   2  . apixaban (ELIQUIS) 5 MG TABS tablet Take 1 tablet (5 mg total) by mouth 2 (two) times daily. 60 tablet   . carvedilol (COREG) 6.25 MG tablet Take 1 tablet (6.25 mg total) by mouth 2 (two) times daily with a meal. 180 tablet 3  . clotrimazole (LOTRIMIN) 1 % cream Apply 1 application topically 2 (two) times daily. 30 g 0  . Evolocumab (REPATHA SURECLICK) 782 MG/ML SOAJ Inject 140 mg into the skin every 14 (fourteen) days. 2 pen 11  . fenofibrate (TRICOR) 145 MG tablet Take 145 mg by mouth daily.    . folic acid (FOLVITE) 1 MG tablet Take 2 mg by mouth daily.     . furosemide (LASIX) 80 MG tablet Take 1 tablet (80 mg total) by mouth 2 (two) times daily. 180 tablet 3  . gabapentin (NEURONTIN) 300 MG capsule Take 300 mg by mouth at bedtime.   2  . hydrocortisone (ANUSOL-HC) 2.5 % rectal cream Place 1 application rectally 2 (two) times daily as needed for hemorrhoids or itching. 30 g 0  . insulin lispro protamine-lispro (HUMALOG  50/50 MIX) (50-50) 100 UNIT/ML SUSP injection Inject 40-50 Units into the skin See admin instructions. Takes 50 units in am, 40 units midday, 50 units in pm    . isosorbide mononitrate (IMDUR) 30 MG 24 hr tablet Take 0.5 tablets (15 mg total) by mouth daily. 45 tablet 3  . leucovorin (WELLCOVORIN) 10 MG tablet Take 10 mg by mouth See admin instructions. Take 1 tablet (10 mg) by mouth every 12 hours and 24 hours after the methotrexate dose    . LUMIGAN 0.01 % SOLN Place 1 drop into both eyes at bedtime.   0  . magnesium oxide (MAG-OX) 400 (241.3 Mg) MG tablet Take 1 tablet (400 mg total) by mouth 2 (two)  times daily. (Patient taking differently: Take 400 mg by mouth daily. ) 60 tablet 0  . Methotrexate Sodium (METHOTREXATE, PF,) 50 MG/2ML injection Inject 15 mg into the muscle every Friday.   0  . Misc Natural Products (OSTEO BI-FLEX ADV DOUBLE ST PO) Take 1 tablet by mouth daily as needed (takes occassionally).     Marland Kitchen omega-3 acid ethyl esters (LOVAZA) 1 g capsule Take 1 capsule by mouth 2 (two) times daily.  11  . omeprazole (PRILOSEC) 20 MG capsule Take 20 mg by mouth daily as needed (heartburn or acid reflux).    Marland Kitchen PARoxetine (PAXIL) 40 MG tablet Take 40 mg by mouth daily.   11  . potassium chloride SA (K-DUR,KLOR-CON) 20 MEQ tablet Take 2 tablets (40 mEq total) 2 (two) times daily by mouth. 120 tablet 5  . predniSONE (DELTASONE) 5 MG tablet Take 5 mg by mouth daily with breakfast.    . RANEXA 500 MG 12 hr tablet TAKE 1 TABLET(500 MG) BY MOUTH TWICE DAILY 180 tablet 2  . sulfaSALAzine (AZULFIDINE) 500 MG tablet Take 500 mg by mouth daily.    . tamsulosin (FLOMAX) 0.4 MG CAPS capsule Take 0.4 mg by mouth daily.   4  . tiZANidine (ZANAFLEX) 4 MG tablet Take 1 tablet by mouth every 8 (eight) hours as needed for muscle spasms.   2  . topiramate (TOPAMAX) 25 MG tablet Take 25 mg by mouth 2 (two) times daily.    . traMADol (ULTRAM) 50 MG tablet Take 1 tablet by mouth 3 (three) times daily as needed  (pain). Take as directed  2  . VITAMIN D, ERGOCALCIFEROL, PO Take 5,000 Units by mouth daily.     Marland Kitchen ZETIA 10 MG tablet Take 10 mg by mouth daily.   11  . metolazone (ZAROXOLYN) 5 MG tablet TAKE DAILY FOR 3 DAYS 1 HOUR PRIOR TO FUROSEMIDE AND THEN AS DIRECTED 30 tablet 1   No current facility-administered medications for this visit.     Allergies:   Statins; Ace inhibitors; Atenolol; Contrast media [iodinated diagnostic agents]; and Penicillins    Social History:  The patient  reports that  has never smoked. he has never used smokeless tobacco. He reports that he does not drink alcohol or use drugs.   Family History:  The patient's family history includes Heart attack in his brother and father; Heart failure in his mother; Hyperlipidemia in his mother; Hypertension in his mother.    ROS:  Please see the history of present illness.   Otherwise, review of systems are positive for sinus congestion.   All other systems are reviewed and negative.    PHYSICAL EXAM: VS:  BP 119/74   Pulse 93   Ht 5\' 11"  (1.803 m)   Wt 105.4 kg (232 lb 6.4 oz)   BMI 32.41 kg/m  , BMI Body mass index is 32.41 kg/m. GENERAL:  Mildly ill-appearing.   HEENT: Pupils equal round and reactive, fundi not visualized, oral mucosa unremarkable NECK: +JVD.  +HJR.  waveform within normal limits, carotid upstroke brisk and symmetric, no bruits, no thyromegaly LUNGS:  Bibasilar crackles.   HEART:  Irregularly irregular.  PMI not displaced or sustained,S1 and S2 within normal limits, no S3, no S4, no clicks, no rubs, no murmurs ABD:  Flat, positive bowel sounds normal in frequency in pitch, no bruits, no rebound, no guarding, no midline pulsatile mass, no hepatomegaly, no splenomegaly EXT:  2 plus pulses throughout, 1+ pitting edema to the upper tiibia bilaterally,  no cyanosis no clubbing SKIN:  No rashes no nodules NEURO:  Cranial nerves II through XII grossly intact, motor grossly intact throughout PSYCH:  Cognitively  intact, oriented to person place and time   EKG:  EKG is not ordered today. The ekg ordered 05/22/16 demonstrates atrial fibrillation rate 68 bpm.   09/28/16: Atrial fibrillation rate 84 bpm.  Non-specific ST changes.  QT prolongation.     Echo 07/20/16: Study Conclusions  - Left ventricle: The cavity size was normal. Wall thickness was   increased in a pattern of mild LVH. Systolic function was normal.   The estimated ejection fraction was in the range of 50% to 55%.   There is akinesis of the basalinferior myocardium. There is   akinesis of the inferolateral myocardium. Features are consistent   with a pseudonormal left ventricular filling pattern, with   concomitant abnormal relaxation and increased filling pressure   (grade 2 diastolic dysfunction). Doppler parameters are   consistent with high ventricular filling pressure. - Left atrium: The atrium was mildly dilated. - Right atrium: The atrium was mildly dilated.  Impressions:  - Akinesis of the basal inferior wall and inferior lateral wall;   overall low normal LV systolic function; grade 2 diastolic   dysfunction with elevated LV filling pressure; mild biatrial   enlargment; trace MR and TR.  Echo 11/04/17: LVEF 45-50%.  Wall motion unchanged from 07/20/16.  PASP 39 mmHg.   LHC 10/03/16: Ost Cx to Dist Cx lesion, 65 %stenosed.  2nd Mrg lesion, 75 %stenosed.  Dist Cx lesion, 80 %stenosed.  Dist LAD lesion, 90 %stenosed.  Mid LAD lesion, 80 %stenosed.  Prox LAD to Mid LAD lesion, 70 %stenosed.   1st Diag lesion, 75 %stenosed.  1st RPLB lesion, 100 %stenosed.  Prox RCA to Dist RCA lesion, 100 %stenosed.  LV end diastolic pressure is mildly elevated.    Severe diffuse three-vessel coronary disease in this patient with multiple prior stents.  Total occlusion of the right coronary within the ostial segment. Distal vessel fills by collaterals from the left coronary. The distal right coronary is small and may  not be graftable.  Severe diffuse LAD disease with 70% proximal stenosis, 80% mid stenosis, and 90% apical stenosis. The first diagonal which is relatively small contains segmental 70-80% stenosis.  Heavily stented proximal to mid circumflex coronary artery with 50-70% in-stent restenosis, diffuse. Large second obtuse marginal with 75% proximal diffuse narrowing. 70-80% segmental narrowing in the mid circumflex after the origin of the second obtuse marginal.  Mildly elevated LV EDP. Recent echo demonstrating EF greater than 50%. Contrast LV gram not performed due to chronic kidney disease. Total contrast used was 80 cc.  Recent Labs: 03/21/2017: ALT 14 10/30/2017: BNP 229.8; BUN 31; Creatinine, Ser 2.39; Hemoglobin 11.7; Platelets 269; Potassium 3.8; Sodium 138    Lipid Panel    Component Value Date/Time   CHOL 179 03/21/2017 1211   TRIG 144 03/21/2017 1211   HDL 49 03/21/2017 1211   CHOLHDL 3.7 03/21/2017 1211   VLDL 29 03/21/2017 1211   LDLCALC 101 (H) 03/21/2017 1211      Wt Readings from Last 3 Encounters:  11/05/17 105.4 kg (232 lb 6.4 oz)  10/30/17 107.5 kg (237 lb)  06/19/17 103.9 kg (229 lb)      ASSESSMENT AND PLAN:  # Acute on chronic systolic and diastolic heart failure:   LVEF slightly declined to 45-50% from 50-55% previously.  He is volume overloaded but slowly improving.  Renal function  was elevated at his last appointment.  We will add metolazone 5 mg daily for the next 3 days.  He will then not take it for 2 days and then take it 2 additional days on Sunday and Monday.  He will continue Lasix 80 mg twice daily.  We will check a basic metabolic panel today.  We discussed the options of trying this outpatient regimen or going in the hospital for IV Lasix.  He prefers to try to stay home.  Continue carvedilol and Imdur.  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  His most recent cath revealed three vessel CAD.   He was referred to Dr. [name] for CABG, but continues to do very well now with medical management.  His chest pressure now is likely 2/2 volume overload.  Diuresis as above.  If he continues to have chest pressure once he is euvloemic, consider CABG. Continue carvedilol, Imdur ranolazine, and Repatha.  # Hypertriglyceridemia:  # Hyperlipidemia: Controlled on Repatha, fenofibrate and Lovaza.  # Hypertension/hypotension: BP well-controlled.  Continue carvedilol.  # Atrial fibrillation: He remains in atrial fibrillation after cardioversion but rates are well-controlled.  Continue Eliquis and carvedilol.  Unclear how this may be contributing to his volume overload.  His patients CHA2DS2-VASc Score and unadjusted Ischemic Stroke Rate (% per year) is equal to 7.2 % stroke rate/year from a score of 5  Above score calculated as 1 point each if present [CHF, HTN, DM, Vascular=MI/PAD/Aortic Plaque, Age if 65-74, or Male] Above score calculated as 2 points each if present [Age > 75, or Stroke/TIA/TE]    Current medicines are reviewed at length with the patient today.  The patient does not have concerns regarding medicines.  The following changes have been made: start metolazone.  Possibility of  Labs/ tests ordered today include:   Orders Placed This Encounter  Procedures  . Basic metabolic panel     Disposition:   FU with Kizzi Overbey C. [name], MD, [facility] in 1 week.    This note was written with the assistance of speech recognition software.  Please excuse any transcriptional errors.  Signed, Woods Gangemi C. [name], MD, [facility]  11/05/2017 1:37 PM    [facility]

## 2017-11-05 NOTE — Patient Instructions (Addendum)
Medication Instructions:  START METOLAZONE 5 MG DAILY FOR THE NEXT 3 DAYS AND THEN ON Sunday AND Monday (11-10-17 AND 11-11-17) TAKE 1 HOUR PRIOR TO YOUR FUROSEMIDE   INCREASE YOUR POTASSIUM TO 40 MEQ TWICE A DAY   Labwork: BMET TODAY   Testing/Procedures: NONE  Follow-Up: Your physician recommends that you schedule a follow-up appointment in: 1 WEEK  Any Other Special Instructions Will Be Listed Below (If Applicable). IF YOU START FEELING WORSE YOU NEED TO GO TO  EMERGENCY DEPARTMENT   If you need a refill on your cardiac medications before your next appointment, please call your pharmacy.

## 2017-11-06 LAB — BASIC METABOLIC PANEL
BUN/Creatinine Ratio: 11 (ref 10–24)
BUN: 24 mg/dL (ref 8–27)
CO2: 25 mmol/L (ref 20–29)
CREATININE: 2.17 mg/dL — AB (ref 0.76–1.27)
Calcium: 9.6 mg/dL (ref 8.6–10.2)
Chloride: 95 mmol/L — ABNORMAL LOW (ref 96–106)
GFR calc Af Amer: 34 mL/min/{1.73_m2} — ABNORMAL LOW (ref >59)
GFR calc non Af Amer: 30 mL/min/{1.73_m2} — ABNORMAL LOW (ref >59)
GLUCOSE: 99 mg/dL (ref 65–99)
Potassium: 4.7 mmol/L (ref 3.5–5.2)
Sodium: 137 mmol/L (ref 134–144)

## 2017-11-12 DIAGNOSIS — S332XXA Dislocation of sacroiliac and sacrococcygeal joint, initial encounter: Secondary | ICD-10-CM | POA: Diagnosis not present

## 2017-11-12 DIAGNOSIS — M9902 Segmental and somatic dysfunction of thoracic region: Secondary | ICD-10-CM | POA: Diagnosis not present

## 2017-11-12 DIAGNOSIS — M9904 Segmental and somatic dysfunction of sacral region: Secondary | ICD-10-CM | POA: Diagnosis not present

## 2017-11-12 DIAGNOSIS — S23132A Subluxation of T5/T6 thoracic vertebra, initial encounter: Secondary | ICD-10-CM | POA: Diagnosis not present

## 2017-11-13 ENCOUNTER — Emergency Department (HOSPITAL_COMMUNITY): Payer: Medicare Other

## 2017-11-13 ENCOUNTER — Encounter (HOSPITAL_COMMUNITY): Payer: Self-pay | Admitting: Emergency Medicine

## 2017-11-13 ENCOUNTER — Inpatient Hospital Stay (HOSPITAL_COMMUNITY)
Admission: EM | Admit: 2017-11-13 | Discharge: 2017-11-17 | DRG: 287 | Disposition: A | Discharge status: Home / Self Care | Payer: Medicare Other | Attending: Internal Medicine | Admitting: Internal Medicine

## 2017-11-13 ENCOUNTER — Ambulatory Visit (INDEPENDENT_AMBULATORY_CARE_PROVIDER_SITE_OTHER): Payer: Medicare Other | Admitting: Cardiovascular Disease

## 2017-11-13 ENCOUNTER — Encounter: Payer: Self-pay | Admitting: Cardiovascular Disease

## 2017-11-13 ENCOUNTER — Other Ambulatory Visit: Payer: Self-pay

## 2017-11-13 VITALS — BP 121/73 | HR 96 | Ht 71.0 in | Wt 222.4 lb

## 2017-11-13 DIAGNOSIS — Z7901 Long term (current) use of anticoagulants: Secondary | ICD-10-CM

## 2017-11-13 DIAGNOSIS — I25118 Atherosclerotic heart disease of native coronary artery with other forms of angina pectoris: Secondary | ICD-10-CM

## 2017-11-13 DIAGNOSIS — E785 Hyperlipidemia, unspecified: Secondary | ICD-10-CM | POA: Diagnosis present

## 2017-11-13 DIAGNOSIS — I5042 Chronic combined systolic (congestive) and diastolic (congestive) heart failure: Secondary | ICD-10-CM | POA: Diagnosis not present

## 2017-11-13 DIAGNOSIS — Z794 Long term (current) use of insulin: Secondary | ICD-10-CM | POA: Diagnosis not present

## 2017-11-13 DIAGNOSIS — I25110 Atherosclerotic heart disease of native coronary artery with unstable angina pectoris: Secondary | ICD-10-CM | POA: Diagnosis not present

## 2017-11-13 DIAGNOSIS — Z79899 Other long term (current) drug therapy: Secondary | ICD-10-CM

## 2017-11-13 DIAGNOSIS — G4733 Obstructive sleep apnea (adult) (pediatric): Secondary | ICD-10-CM | POA: Diagnosis present

## 2017-11-13 DIAGNOSIS — E1121 Type 2 diabetes mellitus with diabetic nephropathy: Secondary | ICD-10-CM | POA: Diagnosis present

## 2017-11-13 DIAGNOSIS — N184 Chronic kidney disease, stage 4 (severe): Secondary | ICD-10-CM | POA: Diagnosis present

## 2017-11-13 DIAGNOSIS — Z22322 Carrier or suspected carrier of Methicillin resistant Staphylococcus aureus: Secondary | ICD-10-CM

## 2017-11-13 DIAGNOSIS — Z66 Do not resuscitate: Secondary | ICD-10-CM | POA: Diagnosis present

## 2017-11-13 DIAGNOSIS — E1169 Type 2 diabetes mellitus with other specified complication: Secondary | ICD-10-CM

## 2017-11-13 DIAGNOSIS — N183 Chronic kidney disease, stage 3 unspecified: Secondary | ICD-10-CM | POA: Diagnosis present

## 2017-11-13 DIAGNOSIS — R079 Chest pain, unspecified: Secondary | ICD-10-CM | POA: Diagnosis present

## 2017-11-13 DIAGNOSIS — E876 Hypokalemia: Secondary | ICD-10-CM | POA: Diagnosis present

## 2017-11-13 DIAGNOSIS — I2582 Chronic total occlusion of coronary artery: Secondary | ICD-10-CM | POA: Diagnosis present

## 2017-11-13 DIAGNOSIS — E7800 Pure hypercholesterolemia, unspecified: Secondary | ICD-10-CM | POA: Diagnosis present

## 2017-11-13 DIAGNOSIS — E1122 Type 2 diabetes mellitus with diabetic chronic kidney disease: Secondary | ICD-10-CM | POA: Diagnosis present

## 2017-11-13 DIAGNOSIS — R42 Dizziness and giddiness: Secondary | ICD-10-CM

## 2017-11-13 DIAGNOSIS — I480 Paroxysmal atrial fibrillation: Secondary | ICD-10-CM | POA: Diagnosis not present

## 2017-11-13 DIAGNOSIS — Z8249 Family history of ischemic heart disease and other diseases of the circulatory system: Secondary | ICD-10-CM

## 2017-11-13 DIAGNOSIS — I10 Essential (primary) hypertension: Secondary | ICD-10-CM

## 2017-11-13 DIAGNOSIS — Z7952 Long term (current) use of systemic steroids: Secondary | ICD-10-CM

## 2017-11-13 DIAGNOSIS — R0789 Other chest pain: Secondary | ICD-10-CM | POA: Diagnosis not present

## 2017-11-13 DIAGNOSIS — Z955 Presence of coronary angioplasty implant and graft: Secondary | ICD-10-CM

## 2017-11-13 DIAGNOSIS — M797 Fibromyalgia: Secondary | ICD-10-CM | POA: Diagnosis present

## 2017-11-13 DIAGNOSIS — I481 Persistent atrial fibrillation: Secondary | ICD-10-CM | POA: Diagnosis not present

## 2017-11-13 DIAGNOSIS — F411 Generalized anxiety disorder: Secondary | ICD-10-CM | POA: Diagnosis present

## 2017-11-13 DIAGNOSIS — R55 Syncope and collapse: Secondary | ICD-10-CM | POA: Diagnosis present

## 2017-11-13 DIAGNOSIS — E1165 Type 2 diabetes mellitus with hyperglycemia: Secondary | ICD-10-CM | POA: Diagnosis present

## 2017-11-13 DIAGNOSIS — I209 Angina pectoris, unspecified: Secondary | ICD-10-CM | POA: Diagnosis not present

## 2017-11-13 DIAGNOSIS — Z9989 Dependence on other enabling machines and devices: Secondary | ICD-10-CM

## 2017-11-13 DIAGNOSIS — I6523 Occlusion and stenosis of bilateral carotid arteries: Secondary | ICD-10-CM

## 2017-11-13 DIAGNOSIS — I252 Old myocardial infarction: Secondary | ICD-10-CM

## 2017-11-13 DIAGNOSIS — E119 Type 2 diabetes mellitus without complications: Secondary | ICD-10-CM | POA: Diagnosis not present

## 2017-11-13 DIAGNOSIS — Z981 Arthrodesis status: Secondary | ICD-10-CM

## 2017-11-13 DIAGNOSIS — I255 Ischemic cardiomyopathy: Secondary | ICD-10-CM | POA: Diagnosis present

## 2017-11-13 DIAGNOSIS — I4819 Other persistent atrial fibrillation: Secondary | ICD-10-CM

## 2017-11-13 DIAGNOSIS — E871 Hypo-osmolality and hyponatremia: Secondary | ICD-10-CM | POA: Diagnosis present

## 2017-11-13 DIAGNOSIS — Z91041 Radiographic dye allergy status: Secondary | ICD-10-CM

## 2017-11-13 DIAGNOSIS — I130 Hypertensive heart and chronic kidney disease with heart failure and stage 1 through stage 4 chronic kidney disease, or unspecified chronic kidney disease: Secondary | ICD-10-CM | POA: Diagnosis not present

## 2017-11-13 DIAGNOSIS — T463X5A Adverse effect of coronary vasodilators, initial encounter: Secondary | ICD-10-CM | POA: Diagnosis present

## 2017-11-13 DIAGNOSIS — Z888 Allergy status to other drugs, medicaments and biological substances status: Secondary | ICD-10-CM

## 2017-11-13 LAB — CBC
HEMATOCRIT: 40.3 % (ref 39.0–52.0)
HEMOGLOBIN: 13.9 g/dL (ref 13.0–17.0)
MCH: 32.2 pg (ref 26.0–34.0)
MCHC: 34.5 g/dL (ref 30.0–36.0)
MCV: 93.3 fL (ref 78.0–100.0)
Platelets: 326 10*3/uL (ref 150–400)
RBC: 4.32 MIL/uL (ref 4.22–5.81)
RDW: 13 % (ref 11.5–15.5)
WBC: 12.6 10*3/uL — AB (ref 4.0–10.5)

## 2017-11-13 LAB — BASIC METABOLIC PANEL
ANION GAP: 13 (ref 5–15)
BUN: 40 mg/dL — ABNORMAL HIGH (ref 6–20)
CHLORIDE: 83 mmol/L — AB (ref 101–111)
CO2: 34 mmol/L — ABNORMAL HIGH (ref 22–32)
Calcium: 9.9 mg/dL (ref 8.9–10.3)
Creatinine, Ser: 2.16 mg/dL — ABNORMAL HIGH (ref 0.61–1.24)
GFR calc Af Amer: 34 mL/min — ABNORMAL LOW (ref >60)
GFR, EST NON AFRICAN AMERICAN: 29 mL/min — AB (ref >60)
Glucose, Bld: 167 mg/dL — ABNORMAL HIGH (ref 65–99)
POTASSIUM: 2.8 mmol/L — AB (ref 3.5–5.1)
SODIUM: 130 mmol/L — AB (ref 135–145)

## 2017-11-13 LAB — I-STAT TROPONIN, ED: Troponin i, poc: 0.03 ng/mL (ref 0.00–0.08)

## 2017-11-13 LAB — CBG MONITORING, ED
GLUCOSE-CAPILLARY: 188 mg/dL — AB (ref 65–99)
GLUCOSE-CAPILLARY: 233 mg/dL — AB (ref 65–99)

## 2017-11-13 MED ORDER — HYDRALAZINE HCL 20 MG/ML IJ SOLN: 5.0000 mg | INTRAMUSCULAR | Status: DC | PRN

## 2017-11-13 MED ORDER — ISOSORBIDE MONONITRATE ER 30 MG PO TB24
15.0000 mg | ORAL_TABLET | Freq: Two times a day (BID) | ORAL | Status: DC
  Administered 2017-11-13 – 2017-11-15 (×5): 15 mg via ORAL
  Filled 2017-11-13 (×5): qty 1

## 2017-11-13 MED ORDER — FUROSEMIDE 80 MG PO TABS
100.0000 mg | ORAL_TABLET | Freq: Two times a day (BID) | ORAL | Status: DC
  Administered 2017-11-14 – 2017-11-15 (×4): 100 mg via ORAL
  Filled 2017-11-13 (×4): qty 1

## 2017-11-13 MED ORDER — ASPIRIN 81 MG PO CHEW
324.0000 mg | CHEWABLE_TABLET | Freq: Once | ORAL | Status: AC
  Administered 2017-11-13: 324 mg via ORAL
  Filled 2017-11-13: qty 4

## 2017-11-13 MED ORDER — POTASSIUM CHLORIDE CRYS ER 20 MEQ PO TBCR
40.0000 meq | EXTENDED_RELEASE_TABLET | Freq: Two times a day (BID) | ORAL | Status: DC
  Administered 2017-11-13 – 2017-11-15 (×4): 40 meq via ORAL
  Filled 2017-11-13 (×4): qty 2

## 2017-11-13 MED ORDER — FOLIC ACID 1 MG PO TABS
2.0000 mg | ORAL_TABLET | Freq: Every day | ORAL | Status: DC
  Administered 2017-11-14 – 2017-11-17 (×4): 2 mg via ORAL
  Filled 2017-11-13 (×5): qty 2

## 2017-11-13 MED ORDER — GABAPENTIN 300 MG PO CAPS
300.0000 mg | ORAL_CAPSULE | Freq: Every day | ORAL | Status: DC
  Administered 2017-11-13 – 2017-11-16 (×4): 300 mg via ORAL
  Filled 2017-11-13 (×4): qty 1

## 2017-11-13 MED ORDER — GI COCKTAIL ~~LOC~~
30.0000 mL | Freq: Four times a day (QID) | ORAL | Status: DC | PRN
  Administered 2017-11-13: 30 mL via ORAL
  Filled 2017-11-13: qty 30

## 2017-11-13 MED ORDER — ALPRAZOLAM 0.5 MG PO TABS
0.5000 mg | ORAL_TABLET | Freq: Three times a day (TID) | ORAL | Status: DC | PRN
  Administered 2017-11-14 – 2017-11-16 (×3): 0.5 mg via ORAL
  Filled 2017-11-13 (×4): qty 1

## 2017-11-13 MED ORDER — HEPARIN (PORCINE) IN NACL 100-0.45 UNIT/ML-% IJ SOLN
1350.0000 [IU]/h | INTRAMUSCULAR | Status: DC
  Administered 2017-11-14 – 2017-11-15 (×3): 1350 [IU]/h via INTRAVENOUS
  Filled 2017-11-13 (×2): qty 250

## 2017-11-13 MED ORDER — SULFASALAZINE 500 MG PO TABS
500.0000 mg | ORAL_TABLET | Freq: Every day | ORAL | Status: DC
  Administered 2017-11-15 – 2017-11-17 (×3): 500 mg via ORAL
  Filled 2017-11-13 (×4): qty 1

## 2017-11-13 MED ORDER — ASPIRIN EC 81 MG PO TBEC
81.0000 mg | DELAYED_RELEASE_TABLET | Freq: Every day | ORAL | Status: DC
  Administered 2017-11-14 – 2017-11-17 (×3): 81 mg via ORAL
  Filled 2017-11-13 (×4): qty 1

## 2017-11-13 MED ORDER — INSULIN GLARGINE 100 UNIT/ML ~~LOC~~ SOLN
20.0000 [IU] | Freq: Every day | SUBCUTANEOUS | Status: DC
  Administered 2017-11-13: 20 [IU] via SUBCUTANEOUS
  Filled 2017-11-13: qty 0.2

## 2017-11-13 MED ORDER — APIXABAN 5 MG PO TABS
5.0000 mg | ORAL_TABLET | Freq: Two times a day (BID) | ORAL | Status: DC
  Administered 2017-11-13: 5 mg via ORAL
  Filled 2017-11-13: qty 1

## 2017-11-13 MED ORDER — MORPHINE SULFATE (PF) 4 MG/ML IV SOLN
2.0000 mg | INTRAVENOUS | Status: DC | PRN
  Administered 2017-11-13: 2 mg via INTRAVENOUS
  Filled 2017-11-13: qty 1

## 2017-11-13 MED ORDER — POTASSIUM CHLORIDE CRYS ER 20 MEQ PO TBCR
40.0000 meq | EXTENDED_RELEASE_TABLET | Freq: Once | ORAL | Status: AC
  Administered 2017-11-13: 40 meq via ORAL
  Filled 2017-11-13: qty 2

## 2017-11-13 MED ORDER — LATANOPROST 0.005 % OP SOLN
1.0000 [drp] | Freq: Every day | OPHTHALMIC | Status: DC
  Administered 2017-11-14 – 2017-11-15 (×3): 1 [drp] via OPHTHALMIC
  Filled 2017-11-13: qty 2.5

## 2017-11-13 MED ORDER — TAMSULOSIN HCL 0.4 MG PO CAPS
0.4000 mg | ORAL_CAPSULE | Freq: Every day | ORAL | Status: DC
  Administered 2017-11-14 – 2017-11-17 (×4): 0.4 mg via ORAL
  Filled 2017-11-13 (×4): qty 1

## 2017-11-13 MED ORDER — INSULIN ASPART 100 UNIT/ML ~~LOC~~ SOLN
0.0000 [IU] | Freq: Every day | SUBCUTANEOUS | Status: DC
  Administered 2017-11-13: 2 [IU] via SUBCUTANEOUS
  Administered 2017-11-14: 4 [IU] via SUBCUTANEOUS
  Filled 2017-11-13: qty 1

## 2017-11-13 MED ORDER — EVOLOCUMAB 140 MG/ML ~~LOC~~ SOAJ: 140.0000 mg | SUBCUTANEOUS | Status: DC

## 2017-11-13 MED ORDER — CARVEDILOL 6.25 MG PO TABS
6.2500 mg | ORAL_TABLET | Freq: Two times a day (BID) | ORAL | Status: DC
  Filled 2017-11-13: qty 1

## 2017-11-13 MED ORDER — INSULIN ASPART 100 UNIT/ML ~~LOC~~ SOLN
0.0000 [IU] | Freq: Three times a day (TID) | SUBCUTANEOUS | Status: DC
  Administered 2017-11-14: 3 [IU] via SUBCUTANEOUS
  Administered 2017-11-14: 11 [IU] via SUBCUTANEOUS
  Administered 2017-11-14: 8 [IU] via SUBCUTANEOUS
  Administered 2017-11-15: 11 [IU] via SUBCUTANEOUS

## 2017-11-13 MED ORDER — PAROXETINE HCL 20 MG PO TABS
40.0000 mg | ORAL_TABLET | Freq: Every day | ORAL | Status: DC
  Administered 2017-11-14 – 2017-11-17 (×4): 40 mg via ORAL
  Filled 2017-11-13 (×5): qty 2

## 2017-11-13 MED ORDER — FENOFIBRATE 160 MG PO TABS
160.0000 mg | ORAL_TABLET | Freq: Every day | ORAL | Status: DC
  Administered 2017-11-14 – 2017-11-17 (×4): 160 mg via ORAL
  Filled 2017-11-13 (×5): qty 1

## 2017-11-13 MED ORDER — TIZANIDINE HCL 4 MG PO TABS
4.0000 mg | ORAL_TABLET | Freq: Three times a day (TID) | ORAL | Status: DC | PRN
  Filled 2017-11-13: qty 1

## 2017-11-13 MED ORDER — ACETAMINOPHEN 325 MG PO TABS: 650.0000 mg | ORAL_TABLET | ORAL | Status: DC | PRN

## 2017-11-13 MED ORDER — TOPIRAMATE 25 MG PO TABS
25.0000 mg | ORAL_TABLET | Freq: Two times a day (BID) | ORAL | Status: DC
Start: 2017-11-13 — End: 2017-11-17
  Administered 2017-11-13 – 2017-11-17 (×7): 25 mg via ORAL
  Filled 2017-11-13 (×8): qty 1

## 2017-11-13 MED ORDER — RANOLAZINE ER 500 MG PO TB12
500.0000 mg | ORAL_TABLET | Freq: Two times a day (BID) | ORAL | Status: DC
  Administered 2017-11-13 – 2017-11-17 (×8): 500 mg via ORAL
  Filled 2017-11-13 (×9): qty 1

## 2017-11-13 MED ORDER — EZETIMIBE 10 MG PO TABS
10.0000 mg | ORAL_TABLET | Freq: Every day | ORAL | Status: DC
  Administered 2017-11-14 – 2017-11-17 (×4): 10 mg via ORAL
  Filled 2017-11-13 (×5): qty 1

## 2017-11-13 MED ORDER — TRAMADOL HCL 50 MG PO TABS: 50.0000 mg | ORAL_TABLET | Freq: Three times a day (TID) | ORAL | Status: DC | PRN

## 2017-11-13 MED ORDER — OMEGA-3-ACID ETHYL ESTERS 1 G PO CAPS
1.0000 | ORAL_CAPSULE | Freq: Two times a day (BID) | ORAL | Status: DC
  Administered 2017-11-13 – 2017-11-17 (×8): 1 g via ORAL
  Filled 2017-11-13 (×9): qty 1

## 2017-11-13 MED ORDER — PREDNISONE 5 MG PO TABS
5.0000 mg | ORAL_TABLET | Freq: Every day | ORAL | Status: DC
  Administered 2017-11-14 – 2017-11-17 (×4): 5 mg via ORAL
  Filled 2017-11-13 (×4): qty 1

## 2017-11-13 NOTE — ED Notes (Signed)
Admitting provider paged for persistent chest pain after medications

## 2017-11-13 NOTE — ED Provider Notes (Signed)
[facility] EMERGENCY DEPARTMENT Provider Note   CSN: [id number] Arrival date & time: 11/13/17  1553     History   Chief Complaint Chief Complaint  Patient presents with  . Chest Pain  . Near Syncope    HPI   Blood pressure (!) 141/87, pulse 89, temperature 98.3 F (36.8 C), temperature source Oral, resp. rate 18, SpO2 99 %.  Julian Ross is a 71 y.o. male with past medical history significant for CAD, systolic CHF, CKD, atrial fibrillation (anticoagulated with Eliquis) brought in by EMS status post presyncope at [place] office today.  He was seeing, he has been improving after aggressive diuresis (lasix and metolazone) for cough in the setting of acute CHF exacerbation.  He had what was described as hunger pains in his left chest area not associated with nausea, shortness of breath.  He was given 1 sublingual nitroglycerin at cardiology office and felt lightheaded, he was lowered to the ground, there was no syncope or head trauma.  He states that this sensation of discomfort in his left chest is 3 out of 10, it has improved since this morning.  It feels disimilar to prior MIs/angina   Past Medical History:  Diagnosis Date  . Anemia   . Anxiety state   . CAD (coronary artery disease)    16 prior stents (at [facility])  . Cardiomyopathy, ischemic   . Chronic lower back pain   . Chronic systolic CHF (congestive heart failure) ([place])   . CKD (chronic kidney disease), stage III ([place])   . Depression   . Diabetes mellitus type 2 in obese ([place])   . Fibromyalgia   . Hepatitis A    at age 48  . High cholesterol   . History of blood transfusion    "related to OR"  . Hypertension   . Hypokalemia   . MI (myocardial infarction) ([place]) 1990; 1995; 1997  . OSA on CPAP   . Osteoarthritis   . Persistent atrial fibrillation ([place]) 02/2016   occured in setting of pneumonia  . Rheumatoid arthritis of multiple sites with negative rheumatoid factor ([place])   .  Septic shock ([place]) 02/2016   in setting of severe pneumonia    Patient Active Problem List   Diagnosis Date Noted  . Pure hypercholesterolemia 04/23/2017  . Chronic anticoagulation 03/21/2017  . HCAP (healthcare-associated pneumonia) 10/30/2016  . Acute hyponatremia 10/30/2016  . OSA on CPAP 10/30/2016  . Sepsis due to pneumonia ([place]) 10/30/2016  . Diabetes mellitus type 2, insulin dependent ([place]) 10/30/2016  . Acute respiratory failure with hypoxia ([place])   . Angina pectoris, crescendo ([place]) 10/03/2016  . Knee effusion   . Syncope 07/20/2016  . Syncope and collapse 07/19/2016  . Chronic combined systolic and diastolic CHF (congestive heart failure) ([place]) 07/19/2016  . CKD (chronic kidney disease) stage 3, GFR 30-59 ml/min (H[facility]) 07/19/2016  . Leukocytosis 07/19/2016  . Anemia of chronic kidney failure 07/19/2016  . Uncontrolled diabetes mellitus with diabetic nephropathy, with long-term current use of insulin ([place]) 07/19/2016  . Benign essential HTN 07/19/2016  . Dyslipidemia associated with type 2 diabetes mellitus ([place]) 07/19/2016  . Paroxysmal atrial fibrillation ([facility])   . Coronary artery disease of native artery of native heart with stable angina pectoris ([place]) 04/16/2016  . Rheumatoid arthritis of multiple sites with negative rheumatoid factor [facility])     Past Surgical History:  Procedure Laterality Date  . BACK SURGERY    . CATARACT EXTRACTION W/ INTRAOCULAR LENS  IMPLANT, BILATERAL Bilateral 2000s  . CORONARY ANGIOPLASTY  early 36s  . CORONARY ANGIOPLASTY WITH STENT PLACEMENT     "I've got 15 stents" (07/19/2016)  . KNEE ARTHROSCOPY Left 1990s  . POSTERIOR LUMBAR FUSION  2015   [id number]  . right knee surgery     age of 85  . TONSILLECTOMY AND ADENOIDECTOMY  1949       Home Medications    Prior to Admission medications   Medication Sig Start Date End Date Taking? Authorizing Provider  acetaminophen (TYLENOL) 325 MG tablet Take 650 mg by mouth every 4 (four)  hours as needed for mild pain or headache.   Yes [provider]  ALPRAZolam (NIRAVAM) 0.5 MG dissolvable tablet Take 1 tablet by mouth as needed (Take as directed).  07/26/16  Yes [provider]  apixaban (ELIQUIS) 5 MG TABS tablet Take 1 tablet (5 mg total) by mouth 2 (two) times daily. 04/01/16  Yes Nita Sells, MD  carvedilol (COREG) 6.25 MG tablet Take 1 tablet (6.25 mg total) by mouth 2 (two) times daily with a meal. 02/25/17  Yes Skeet Latch, MD  clotrimazole (LOTRIMIN) 1 % cream Apply 1 application topically 2 (two) times daily. Patient taking differently: Apply 1 application 2 (two) times daily as needed topically (dry skin).  11/06/16  Yes Patrecia Pour, MD  Evolocumab (REPATHA SURECLICK) 588 MG/ML SOAJ Inject 140 mg into the skin every 14 (fourteen) days. 04/25/17  Yes Skeet Latch, MD  fenofibrate (TRICOR) 145 MG tablet Take 145 mg by mouth daily.   Yes [provider]  folic acid (FOLVITE) 1 MG tablet Take 2 mg by mouth daily.    Yes [provider]  furosemide (LASIX) 80 MG tablet Take 1 tablet (80 mg total) by mouth 2 (two) times daily. Patient taking differently: Take 100 mg 2 (two) times daily by mouth.  10/30/17  Yes Kilroy, Luke K, PA-C  gabapentin (NEURONTIN) 300 MG capsule Take 300 mg by mouth at bedtime.  08/22/16  Yes [provider]  hydrocortisone (ANUSOL-HC) 2.5 % rectal cream Place 1 application rectally 2 (two) times daily as needed for hemorrhoids or itching. 07/23/16  Yes Robbie Lis, MD  insulin lispro protamine-lispro (HUMALOG 50/50 MIX) (50-50) 100 UNIT/ML SUSP injection Inject 40-50 Units into the skin See admin instructions. Takes 50 units in am, 40 units midday, 50 units in pm   Yes [provider]  isosorbide mononitrate (IMDUR) 30 MG 24 hr tablet Take 0.5 tablets (15 mg total) by mouth daily. Patient taking differently: Take 15 mg 2 (two) times daily by mouth.  10/30/17  Yes Kilroy, Luke K, PA-C   leucovorin (WELLCOVORIN) 10 MG tablet Take 10 mg by mouth See admin instructions. Take 1 tablet (10 mg) by mouth every 12 hours and 24 hours after the methotrexate dose   Yes [provider]  LUMIGAN 0.01 % SOLN Place 1 drop into both eyes at bedtime.  04/30/16  Yes [provider]  magnesium oxide (MAG-OX) 400 (241.3 Mg) MG tablet Take 1 tablet (400 mg total) by mouth 2 (two) times daily. 04/01/16  Yes Nita Sells, MD  Methotrexate Sodium (METHOTREXATE, PF,) 50 MG/2ML injection Inject 15 mg into the muscle every Friday.  03/09/16  Yes [provider]  Misc Natural Products (OSTEO BI-FLEX ADV DOUBLE ST PO) Take 1 tablet by mouth daily as needed (takes occassionally).    Yes [provider]  omega-3 acid ethyl esters (LOVAZA) 1 g capsule Take 1  capsule by mouth 2 (two) times daily. 05/29/16  Yes [provider]  omeprazole (PRILOSEC) 20 MG capsule Take 20 mg by mouth daily as needed (heartburn or acid reflux).   Yes [provider]  PARoxetine (PAXIL) 40 MG tablet Take 40 mg by mouth daily.  04/30/16  Yes [provider]  potassium chloride SA (K-DUR,KLOR-CON) 20 MEQ tablet Take 2 tablets (40 mEq total) 2 (two) times daily by mouth. 11/05/17  Yes Skeet Latch, MD  predniSONE (DELTASONE) 5 MG tablet Take 5 mg by mouth daily with breakfast.   Yes [provider]  RANEXA 500 MG 12 hr tablet TAKE 1 TABLET(500 MG) BY MOUTH TWICE DAILY 05/15/17  Yes Skeet Latch, MD  sulfaSALAzine (AZULFIDINE) 500 MG tablet Take 500 mg by mouth daily.   Yes [provider]  tamsulosin (FLOMAX) 0.4 MG CAPS capsule Take 0.4 mg by mouth daily.  02/21/16  Yes [provider]  tiZANidine (ZANAFLEX) 4 MG tablet Take 1 tablet by mouth every 8 (eight) hours as needed for muscle spasms.  05/29/16  Yes [provider]  topiramate (TOPAMAX) 25 MG tablet Take 25 mg by mouth 2 (two) times daily.   Yes [provider]    traMADol (ULTRAM) 50 MG tablet Take 1 tablet by mouth 3 (three) times daily as needed (pain). Take as directed 08/24/16  Yes [provider]  VITAMIN D, ERGOCALCIFEROL, PO Take 5,000 Units by mouth daily.    Yes [provider]  ZETIA 10 MG tablet Take 10 mg by mouth daily.  07/08/16  Yes [provider]  metolazone (ZAROXOLYN) 5 MG tablet TAKE DAILY FOR 3 DAYS 1 HOUR PRIOR TO FUROSEMIDE AND THEN AS DIRECTED Patient not taking: Reported on 11/13/2017 11/05/17   Skeet Latch, MD    Family History Family History  Problem Relation Age of Onset  . Heart failure Mother   . Hyperlipidemia Mother   . Hypertension Mother   . Heart attack Father   . Heart attack Brother     Social History Social History   Tobacco Use  . Smoking status: Never Smoker  . Smokeless tobacco: Never Used  Substance Use Topics  . Alcohol use: No  . Drug use: No     Allergies   Statins; Ace inhibitors; Contrast media [iodinated diagnostic agents]; and Penicillins   Review of Systems Review of Systems  A complete review of systems was obtained and all systems are negative except as noted in the HPI and PMH.   Physical Exam Updated Vital Signs BP 131/87   Pulse 98   Temp 98.3 F (36.8 C) (Oral)   Resp 19   SpO2 98%   Physical Exam  Constitutional: He is oriented to person, place, and time. He appears well-developed and well-nourished. No distress.  HENT:  Head: Normocephalic.  Mouth/Throat: Oropharynx is clear and moist.  Eyes: Conjunctivae are normal.  Neck: Normal range of motion. No JVD present. No tracheal deviation present.  Cardiovascular: Normal rate, regular rhythm and intact distal pulses.  Radial pulse equal bilaterally  Pulmonary/Chest: Effort normal and breath sounds normal. No stridor. No respiratory distress. He has no wheezes. He has no rales. He exhibits no tenderness.  Abdominal: Soft. He exhibits no distension and no mass. There is no tenderness.  There is no rebound and no guarding.  Musculoskeletal: Normal range of motion. He exhibits no edema or tenderness.  No calf asymmetry, superficial collaterals, palpable cords, edema, Homans sign negative bilaterally.  Neurological: He is alert and oriented to person, place, and time.  Skin: Skin is warm. He is not diaphoretic.  Psychiatric: He has a normal mood and affect.  Nursing note and vitals reviewed.    ED Treatments / Results  Labs (all labs ordered are listed, but only abnormal results are displayed) Labs Reviewed  BASIC METABOLIC PANEL - Abnormal; Notable for the following components:      Result Value   Sodium 130 (*)    Potassium 2.8 (*)    Chloride 83 (*)    CO2 34 (*)    Glucose, Bld 167 (*)    BUN 40 (*)    Creatinine, Ser 2.16 (*)    GFR calc non Af Amer 29 (*)    GFR calc Af Amer 34 (*)    All other components within normal limits  CBC - Abnormal; Notable for the following components:   WBC 12.6 (*)    All other components within normal limits  MAGNESIUM  I-STAT TROPONIN, ED    EKG  EKG Interpretation  Date/Time:  Wednesday November 13 2017 16:35:53 EST Ventricular Rate:  97 PR Interval:    QRS Duration: 129 QT Interval:  363 QTC Calculation: 462 R Axis:   17 Text Interpretation:  Atrial fibrillation Nonspecific intraventricular conduction delay Repol abnrm suggests ischemia, lateral leads No significant change since last tracing Confirmed by Gareth Morgan [phone]) on 11/13/2017 5:50:11 PM       Radiology Dg Chest 2 View  Result Date: 11/13/2017 CLINICAL DATA:  Left-sided chest pain. EXAM: CHEST  2 VIEW COMPARISON:  08/30/2017 FINDINGS: There is moderate cardiac enlargement. No pleural effusion. Decreased lung volumes with asymmetric elevation of right hemidiaphragm. Chronic reticular interstitial opacities are identified left-greater-than-right and are favored to represent sequelae of chronic interstitial lung disease. IMPRESSION: 1. Cardiac  enlargement without evidence for heart failure. 2. Suspect chronic interstitial lung disease. No superimposed pulmonary opacities identified. Electronically Signed   By: Kerby Moors M.D.   On: 11/13/2017 16:36    Procedures Procedures (including critical care time)  Medications Ordered in ED Medications  potassium chloride SA (K-DUR,KLOR-CON) CR tablet 40 mEq (not administered)  aspirin chewable tablet 324 mg (not administered)     Initial Impression / Assessment and Plan / ED Course  I have reviewed the triage vital signs and the nursing notes.  Pertinent labs & imaging results that were available during my care of the patient were reviewed by me and considered in my medical decision making (see chart for details).     Vitals:   11/13/17 1601 11/13/17 1650 11/13/17 1730 11/13/17 1815  BP: (!) 141/87  (!) 130/91 131/87  Pulse: 89 (!) 102 84 98  Resp: 18 20 (!) 26 19  Temp: 98.3 F (36.8 C)     TempSrc: Oral     SpO2: 99% 100% 98% 98%    Medications  potassium chloride SA (K-DUR,KLOR-CON) CR tablet 40 mEq (not administered)  aspirin chewable tablet 324 mg (not administered)    Julian Ross is 71 y.o. male presenting with left-sided chest discomfort onset this morning.  Has been treated for CHF exacerbation which he feels is improving patient with a mild left-sided chest discomfort which he describes as hunger pains.  EKG unchanged.  He had a pre-syncopal event at my cardiology office after he was given a single sublingual nitroglycerin.  Troponin negative, hypokalemic at 2.8.  Magnesium pending, he reports persistent left-sided chest discomfort, will need admission for rule out.  Final Clinical Impressions(s) / ED Diagnoses   Final diagnoses:  Chest discomfort  Postural dizziness with presyncope    ED Discharge Orders    None       Waynetta Pean 11/13/17 [id number]    Gareth Morgan, MD 11/17/17 1744

## 2017-11-13 NOTE — Progress Notes (Signed)
Cardiology Office Note   Date:  11/13/2017   ID:  Julian Ross, DOB [birth year], MRN [id number]  PCP:  Julian Bowen, MD  Cardiologist:   Julian Latch, MD  CT Surgeon: Dr. Roxy Ross  Chief Complaint  Patient presents with  . Follow-up    1 WEEK;     History of Present Illness: Julian Ross is a 71 y.o. male with chronic systolic and diastolic heart failure LVEF improved from 30-35% to 50-55%, paroxysmal atrial fibrillation, CAD s/p multiple PCIs, who presents for follow up.  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  During that hospitalization he also had elevated troponin consistent with demand ischemia (troponin 0.13) and new onset atrial fibrillation with RVR.  He was diuresed to a discharge weight of 196 lb (from 208 lb on admit).  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  He previously had 16 stents placed in [place], [place].  His cardiologist there is Dr. Alroy Ross.  The inpatient team contacted Dr. Alroy Ross, who informed them that Julian Ross has a tight RCA lesion but no recent interventions.  He felt that Julian Ross could transition from Plavix to aspirin.  He has been intolerant to statins in the past.    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  After cardioversion he developed bradycardia and initially metoprolol was reduced.  Then he was switched from metoprolol to carvedilol.  This was further reduced to 6.25mg  bid on 06/19/16.  On 7/21 he had a recurrent episode of syncope. Hydralazine and isosorbide were discontinued.  Troponin was mildly elevated at 0.03.  CT of the head was negative, and chest x-ray showed no acute edema or infiltrate.  During that hospitalization he was noted to be orthostatic.  His SBP dropped to 80 when standing.  Carvedilol was reduced 1.5625 mg bid and his orthostasis was improved.  Echo that admission showed an improvement in his LVEF to 50-55%.  He wore an event monitor 07/31/16 that revealed short runs of atrial fibrillation as well as PVCs and PACs. He reported angina and underwent LHC  10/03/16 where he was noted to have severe, diffuse, three-vessel coronary disease with multiple areas of prior stenting. A recommendation was made for intensification of medical therapy or consideration of coronary artery bypass grafting. He was scheduled for CABG/MAZE but developed pneumonia requiring hospitalization 10/2016.  He had significant clinical improvement and a decision made was made to continue medical management unless his symptoms worsened.  He started on Repatha 03/2017.  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  His weight was up to 232 pounds.  Lasix was increased to 80 mg twice daily and low-dose Isordil was added to his regimen.  He had a repeat echocardiogram 11/04/17 that revealed LVEF 45-50% with inferolateral and basal to mid inferior walls.  Dr. [name] was last seen in clinic 11/05/17.  At that appointment he reported increased chest tightness.  His weight was still elevated to 232 pounds.  Metolazone was added to his regimen and he has lost 10 pounds since that time.  He no longer has orthopnea and his lower extremity edema has improved significantly.  He no longer has chest pressure and had a good day yesterday.  However this morning when he got up he felt as though he had hunger pains in his upper abdomen.  He continues to have that discomfort all day.  It is not improved with eating.  It gets slightly worse with exertion.  He denies shortness of breath and nausea but has been feeling clammy.   Past Medical History:  Diagnosis Date  . Anemia   . Anxiety state   . CAD (coronary artery disease)    16 prior stents (at [facility])  . Cardiomyopathy, ischemic   . Chronic lower back pain   . Chronic systolic CHF (congestive heart failure) ([place])   . CKD (chronic kidney disease), stage III ([place])   . Depression   . Diabetes mellitus  type 2 in obese ([place])   . Fibromyalgia   . Hepatitis A    at age 68  . High cholesterol   . History of blood transfusion    "related to OR"  . Hypertension   . Hypokalemia   . MI (myocardial infarction) ([place]) 1990; 1995; 1997  . OSA on CPAP   . Osteoarthritis   . Persistent atrial fibrillation ([place]) 02/2016   occured in setting of pneumonia  . Rheumatoid arthritis of multiple sites with negative rheumatoid factor ([place])   . Septic shock ([place]) 02/2016   in setting of severe pneumonia    Past Surgical History:  Procedure Laterality Date  . BACK SURGERY    . CATARACT EXTRACTION W/ INTRAOCULAR LENS  IMPLANT, BILATERAL Bilateral 2000s  . CORONARY ANGIOPLASTY  early 48s  . CORONARY ANGIOPLASTY WITH STENT PLACEMENT     "I've got 15 stents" (07/19/2016)  . KNEE ARTHROSCOPY Left 1990s  . POSTERIOR LUMBAR FUSION  2015   L4-5-S1  . right knee surgery     age of 62  . TONSILLECTOMY AND ADENOIDECTOMY  1949     Current Outpatient Medications  Medication Sig Dispense Refill  . acetaminophen (TYLENOL) 325 MG tablet Take 650 mg by mouth every 4 (four) hours as needed for mild pain or headache.    . ALPRAZolam (NIRAVAM) 0.5 MG dissolvable tablet Take 1 tablet by mouth as needed (Take as directed).   2  . apixaban (ELIQUIS) 5 MG TABS tablet Take 1 tablet (5 mg total) by mouth 2 (two) times daily. 60 tablet   . carvedilol (COREG) 6.25 MG tablet Take 1 tablet (6.25 mg total) by mouth 2 (two) times daily with a meal. 180 tablet 3  . clotrimazole (LOTRIMIN) 1 % cream Apply 1 application topically 2 (two) times daily. 30 g 0  . Evolocumab (REPATHA SURECLICK) 119 MG/ML SOAJ Inject 140 mg into the skin every 14 (fourteen) days. 2 pen 11  . fenofibrate (TRICOR) 145 MG tablet Take 145 mg by mouth daily.    . folic acid (FOLVITE) 1 MG tablet Take 2 mg by mouth daily.     . furosemide (LASIX) 80 MG tablet Take 1 tablet (80 mg total) by mouth 2 (two) times daily. 180 tablet 3  . gabapentin (NEURONTIN)  300 MG capsule Take 300 mg by mouth at bedtime.   2  . hydrocortisone (ANUSOL-HC) 2.5 % rectal cream Place 1 application rectally 2 (two) times daily as needed for hemorrhoids or itching. 30 g 0  . insulin lispro protamine-lispro (HUMALOG 50/50 MIX) (50-50) 100 UNIT/ML SUSP injection Inject 40-50 Units into the skin See admin instructions. Takes 50 units in am, 40 units midday, 50 units in pm    . isosorbide mononitrate (IMDUR) 30 MG 24 hr tablet Take 0.5 tablets (15 mg total) by mouth daily. 45 tablet 3  . leucovorin (WELLCOVORIN) 10 MG tablet Take 10 mg by mouth See admin instructions. Take 1  tablet (10 mg) by mouth every 12 hours and 24 hours after the methotrexate dose    . LUMIGAN 0.01 % SOLN Place 1 drop into both eyes at bedtime.   0  . magnesium oxide (MAG-OX) 400 (241.3 Mg) MG tablet Take 1 tablet (400 mg total) by mouth 2 (two) times daily. (Patient taking differently: Take 400 mg by mouth daily. ) 60 tablet 0  . Methotrexate Sodium (METHOTREXATE, PF,) 50 MG/2ML injection Inject 15 mg into the muscle every Friday.   0  . metolazone (ZAROXOLYN) 5 MG tablet TAKE DAILY FOR 3 DAYS 1 HOUR PRIOR TO FUROSEMIDE AND THEN AS DIRECTED 30 tablet 1  . Misc Natural Products (OSTEO BI-FLEX ADV DOUBLE ST PO) Take 1 tablet by mouth daily as needed (takes occassionally).     Marland Kitchen omega-3 acid ethyl esters (LOVAZA) 1 g capsule Take 1 capsule by mouth 2 (two) times daily.  11  . omeprazole (PRILOSEC) 20 MG capsule Take 20 mg by mouth daily as needed (heartburn or acid reflux).    Marland Kitchen PARoxetine (PAXIL) 40 MG tablet Take 40 mg by mouth daily.   11  . potassium chloride SA (K-DUR,KLOR-CON) 20 MEQ tablet Take 2 tablets (40 mEq total) 2 (two) times daily by mouth. 120 tablet 5  . predniSONE (DELTASONE) 5 MG tablet Take 5 mg by mouth daily with breakfast.    . RANEXA 500 MG 12 hr tablet TAKE 1 TABLET(500 MG) BY MOUTH TWICE DAILY 180 tablet 2  . sulfaSALAzine (AZULFIDINE) 500 MG tablet Take 500 mg by mouth daily.    .  tamsulosin (FLOMAX) 0.4 MG CAPS capsule Take 0.4 mg by mouth daily.   4  . tiZANidine (ZANAFLEX) 4 MG tablet Take 1 tablet by mouth every 8 (eight) hours as needed for muscle spasms.   2  . topiramate (TOPAMAX) 25 MG tablet Take 25 mg by mouth 2 (two) times daily.    . traMADol (ULTRAM) 50 MG tablet Take 1 tablet by mouth 3 (three) times daily as needed (pain). Take as directed  2  . VITAMIN D, ERGOCALCIFEROL, PO Take 5,000 Units by mouth daily.     Marland Kitchen ZETIA 10 MG tablet Take 10 mg by mouth daily.   11   No current facility-administered medications for this visit.     Allergies:   Statins; Ace inhibitors; Atenolol; Contrast media [iodinated diagnostic agents]; and Penicillins    Social History:  The patient  reports that  has never smoked. he has never used smokeless tobacco. He reports that he does not drink alcohol or use drugs.   Family History:  The patient's family history includes Heart attack in his brother and father; Heart failure in his mother; Hyperlipidemia in his mother; Hypertension in his mother.    ROS:  Please see the history of present illness.   Otherwise, review of systems are positive for sinus congestion.   All other systems are reviewed and negative.    PHYSICAL EXAM: VS:  BP 121/73   Pulse 96   Ht 5\' 11"  (1.803 m)   Wt 100.9 kg (222 lb 6.4 oz)   BMI 31.02 kg/m  , BMI Body mass index is 31.02 kg/m. GENERAL:  Well appearing HEENT: Pupils equal round and reactive, fundi not visualized, oral mucosa unremarkable NECK:  No jugular venous distention, waveform within normal limits, carotid upstroke brisk and symmetric, no bruits, no thyromegaly LYMPHATICS:  No cervical adenopathy LUNGS:  Clear to auscultation bilaterally HEART:  RRR.  PMI not  displaced or sustained,S1 and S2 within normal limits, no S3, no S4, no clicks, no rubs, no murmurs ABD:  Flat, positive bowel sounds normal in frequency in pitch, no bruits, no rebound, no guarding, no midline pulsatile mass,  no hepatomegaly, no splenomegaly EXT:  2 plus pulses throughout, no edema, no cyanosis no clubbing SKIN:  No rashes no nodules NEURO:  Cranial nerves II through XII grossly intact, motor grossly intact throughout PSYCH:  Cognitively intact, oriented to person place and time   EKG:  EKG is ordered today. The ekg ordered 05/22/16 demonstrates atrial fibrillation rate 68 bpm.   09/28/16: Atrial fibrillation rate 84 bpm.  Non-specific ST changes.  QT prolongation.  11/13/17: Atrial fibrillation.  Rate 88 bpm.  Prior inferior infarct.  Echo 07/20/16: Study Conclusions  - Left ventricle: The cavity size was normal. Wall thickness was   increased in a pattern of mild LVH. Systolic function was normal.   The estimated ejection fraction was in the range of 50% to 55%.   There is akinesis of the basalinferior myocardium. There is   akinesis of the inferolateral myocardium. Features are consistent   with a pseudonormal left ventricular filling pattern, with   concomitant abnormal relaxation and increased filling pressure   (grade 2 diastolic dysfunction). Doppler parameters are   consistent with high ventricular filling pressure. - Left atrium: The atrium was mildly dilated. - Right atrium: The atrium was mildly dilated.  Impressions:  - Akinesis of the basal inferior wall and inferior lateral wall;   overall low normal LV systolic function; grade 2 diastolic   dysfunction with elevated LV filling pressure; mild biatrial   enlargment; trace MR and TR.  Echo 11/04/17: LVEF 45-50%.  Wall motion unchanged from 07/20/16.  PASP 39 mmHg.   LHC 10/03/16: Ost Cx to Dist Cx lesion, 65 %stenosed.  2nd Mrg lesion, 75 %stenosed.  Dist Cx lesion, 80 %stenosed.  Dist LAD lesion, 90 %stenosed.  Mid LAD lesion, 80 %stenosed.  Prox LAD to Mid LAD lesion, 70 %stenosed.   1st Diag lesion, 75 %stenosed.  1st RPLB lesion, 100 %stenosed.  Prox RCA to Dist RCA lesion, 100 %stenosed.  LV end  diastolic pressure is mildly elevated.    Severe diffuse three-vessel coronary disease in this patient with multiple prior stents.  Total occlusion of the right coronary within the ostial segment. Distal vessel fills by collaterals from the left coronary. The distal right coronary is small and may not be graftable.  Severe diffuse LAD disease with 70% proximal stenosis, 80% mid stenosis, and 90% apical stenosis. The first diagonal which is relatively small contains segmental 70-80% stenosis.  Heavily stented proximal to mid circumflex coronary artery with 50-70% in-stent restenosis, diffuse. Large second obtuse marginal with 75% proximal diffuse narrowing. 70-80% segmental narrowing in the mid circumflex after the origin of the second obtuse marginal.  Mildly elevated LV EDP. Recent echo demonstrating EF greater than 50%. Contrast LV gram not performed due to chronic kidney disease. Total contrast used was 80 cc.  Recent Labs: 03/21/2017: ALT 14 10/30/2017: BNP 229.8; Hemoglobin 11.7; Platelets 269 11/05/2017: BUN 24; Creatinine, Ser 2.17; Potassium 4.7; Sodium 137    Lipid Panel    Component Value Date/Time   CHOL 179 03/21/2017 1211   TRIG 144 03/21/2017 1211   HDL 49 03/21/2017 1211   CHOLHDL 3.7 03/21/2017 1211   VLDL 29 03/21/2017 1211   LDLCALC 101 (H) 03/21/2017 1211      Wt Readings from Last 3  Encounters:  11/13/17 100.9 kg (222 lb 6.4 oz)  11/05/17 105.4 kg (232 lb 6.4 oz)  10/30/17 107.5 kg (237 lb)      ASSESSMENT AND PLAN:  # Epigastric/chest discomfort:  # CAD s/p PCI: # CCS Class III angina:  While in the office Dr. [name] was given a nitroglycerin.  He became acutely diaphoretic and clammy.  He had decreased consciousness but did not lose consciousness.  He was lowered to the ground and his legs were elevated with improvement in his symptoms within minutes.  EKG after the event showed atrial fibrillation with a prior inferior infarct.  Blood pressure after the  event was 122/84.  Given this episode and his discomfort all day we will refer him to [facility] via [facility] for further evaluation.  He has known multivessel CAD and multiple PCI's.  He has been evaluated for CABG and because he had previously been critically ill.  This may need to need to be reevaluate this admission.  Continue carvedilol, Imdur ranolazine, and Repatha.  # Acute on chronic systolic and diastolic heart failure:   LVEF slightly declined to 45-50% from 50-55% previously.  Volume status has improved with furosemide and metolazone.  Check a basic metabolic panel on admission. Continue furosemide and likely use metolazone only as needed for volume overload.  Continue carvedilol.  # Hypertension/hypotension: BP well-controlled.  Continue carvedilol.  # Atrial fibrillation: He remains in atrial fibrillation after cardioversion but rates are well-controlled.  Hold Eliqis for possible procedures. Continue carvedilol.  His patients CHA2DS2-VASc Score and unadjusted Ischemic Stroke Rate (% per year) is equal to 7.2 % stroke rate/year from a score of 5  Above score calculated as 1 point each if present [CHF, HTN, DM, Vascular=MI/PAD/Aortic Plaque, Age if 65-74, or Male] Above score calculated as 2 points each if present [Age > 75, or Stroke/TIA/TE]    Current medicines are reviewed at length with the patient today.  The patient does not have concerns regarding medicines.  The following changes have been made: hold Eliquis   Labs/ tests ordered today include:   No orders of the defined types were placed in this encounter.    Disposition:   FU with Kelsey Durflinger C. [name], MD, [facility] after hospitalization.    This note was written with the assistance of speech recognition software.  Please excuse any transcriptional errors.  Signed, Aldean Pipe C. [name], MD, [facility]  11/13/2017 3:24 PM    [facility]

## 2017-11-13 NOTE — ED Notes (Signed)
Admitting MD at bedside.

## 2017-11-13 NOTE — ED Triage Notes (Signed)
Per EMS:  Pt presents to ED for PCP where he went due to central pressure starting this morning.   Pt describes the pain as "feeling hungry" and unlike any of his previous cardiac events.  Pt given 1 nitro at MD office, had syncopal episode, lowered to ground, BP low.  EKG Unremarkable.   Pt also noted to have high CBG this morning, gtook 30 units of "humolog 50" prior to appt.  CBG 207 en route.

## 2017-11-13 NOTE — ED Notes (Signed)
Pt changed to hospital bed while awaiting for admission

## 2017-11-13 NOTE — Progress Notes (Addendum)
ANTICOAGULATION CONSULT NOTE - Initial Consult  Pharmacy Consult for heparin (PTA Eliquis on hold) Indication: atrial fibrillation  Allergies  Allergen Reactions  . Statins Other (See Comments)    Causes stiffness in joints   . Ace Inhibitors Cough  . Contrast Media [Iodinated Diagnostic Agents] Rash    Has to take benadryl prior to use  . Penicillins Hives and Rash    Has patient had a PCN reaction causing immediate rash, facial/tongue/throat swelling, SOB or lightheadedness with hypotension: Yes Has patient had a PCN reaction causing severe rash involving mucus membranes or skin necrosis: No Has patient had a PCN reaction that required hospitalization pt was in the hospital at time of last reaction - heart attack Has patient had a PCN reaction occurring within the last 10 years: No If all of the above answers are "NO", then may proceed with Cephalosporin use.   Patient Measurements: Heparin Dosing Weight: 96.2 kg   Vital Signs: Temp: 98.3 F (36.8 C) (11/14 1601) Temp Source: Oral (11/14 1601) BP: 151/82 (11/14 2130) Pulse Rate: 97 (11/14 2130)  Labs: Recent Labs    11/13/17 1635  HGB 13.9  HCT 40.3  PLT 326  CREATININE 2.16*    Estimated Creatinine Clearance: 37.9 mL/min (A) (by C-G formula based on SCr of 2.16 mg/dL (H)).   Medical History: Past Medical History:  Diagnosis Date  . Anemia   . Anxiety state   . CAD (coronary artery disease)    16 prior stents (at [facility])  . Cardiomyopathy, ischemic   . Chronic lower back pain   . Chronic systolic CHF (congestive heart failure) ([place])   . CKD (chronic kidney disease), stage III ([place])   . Depression   . Diabetes mellitus type 2 in obese ([place])   . Fibromyalgia   . Hepatitis A    at age 41  . High cholesterol   . History of blood transfusion    "related to OR"  . Hypertension   . Hypokalemia   . MI (myocardial infarction) ([place]) 1990; 1995; 1997  . OSA on CPAP    setting = 13, full face mask  .  Osteoarthritis   . Persistent atrial fibrillation ([place]) 02/2016   on Eliquis  . Rheumatoid arthritis of multiple sites with negative rheumatoid factor ([place])   . Septic shock ([place]) 02/2016   in setting of severe pneumonia   Assessment: 71 yo male admitted with chest pain. History of afib on Eliquis, with last dose 11/14 at 2230. Pharmacy consulted to dose heparin while Eliquis now on hold for possible cath.   Due to influence of Eliquis on heparin levels, will dose based on aPTT for now.   CBC stable and no s/s bleeding documented.   Goal of Therapy:  Heparin level 0.3-0.7 units/ml  APTT 66-102s Monitor platelets by anticoagulation protocol: Yes   Plan:  Baseline heparin level and aPTT with AM labs Start heparin gtt at 1350 units/hr at 11AM on 11/15 (no bolus) Heparin level and aPTT 8 hrs after gtt started  Daily heparin level, aptt, and CBC Monitor for s/s bleeding F/u cath plan   Lavonda Jumbo, PharmD Clinical Pharmacist 11/13/17 11:47 PM

## 2017-11-13 NOTE — H&P (Signed)
History and Physical    Draden Cottingham VZC:[id number] DOB: 1946-11-23 DOA: 11/13/2017  PCP: Reynold Bowen, MD Consultants:  [name] - cardiology; Shroff - rheumatology Patient coming from:  Home - lives with wife and son; Donald Prose: wife, [phone]  Chief Complaint: chest pain  HPI: Julian Ross is a 71 y.o. male with medical history significant of RA; CAD with significant need for prior intervention (stents x 16); afib on Eliquis; OSA on CPAP;  HTN; HLD; DM; stage 3 CKD; and chronic systolic heart failure (EF 45-50% on 11/5) presenting with CP/a funny feeling in his stomach.  By the time he got to cardiology office for routine appointment it was more of a pain.  They checked things at the office, and had him take NTG and he "had a good vagal response on that one."  He had CHF last month and they had been drying him out with diuretics.  It was hard for him to distinguish whether it was stomach or chest discomfort causing the pain.  He has had a bunch of stents in the past.  He still had 70% occlusion as of last cath about a year ago.  He is unsure if the NTG made his pain feel better because the NTG made him so miserable.  Cath report from 10/03/16:   Severe diffuse three-vessel coronary disease in this patient with multiple prior stents.  Total occlusion of the right coronary within the ostial segment. Distal vessel fills by collaterals from the left coronary. The distal right coronary is small and may not be graftable.  Severe diffuse LAD disease with 70% proximal stenosis, 80% mid stenosis, and 90% apical stenosis. The first diagonal which is relatively small contains segmental 70-80% stenosis.  Heavily stented proximal to mid circumflex coronary artery with 50-70% in-stent restenosis, diffuse. Large second obtuse marginal with 75% proximal diffuse narrowing. 70-80% segmental narrowing in the mid circumflex after the origin of the second obtuse marginal.  Note from Dr. [name] today reports  that he became acutely diaphoretic and clammy after NTG with decreased LOC.  He was lowered to the ground and his legs elevated and his symptoms improved within minutes.  EKG with afib and prior inferior infarct.  She recommends: continue carvedilol, Imdur, ranolazine, Repatha, furosemide; hold metolazone and Eliquis.   ED Course: left-sided chest discomfort.  EKG unchanged.  Pre-syncope after NTG at cardiology office.  Troponin negative.  K+ 2.8, Mag pending.  Review of Systems: As per HPI; otherwise review of systems reviewed and negative.   Ambulatory Status:  Ambulates without assistance  Past Medical History:  Diagnosis Date  . Anemia   . Anxiety state   . CAD (coronary artery disease)    16 prior stents (at [facility])  . Cardiomyopathy, ischemic   . Chronic lower back pain   . Chronic systolic CHF (congestive heart failure) ([place])   . CKD (chronic kidney disease), stage III ([place])   . Depression   . Diabetes mellitus type 2 in obese ([place])   . Fibromyalgia   . Hepatitis A    at age 69  . High cholesterol   . History of blood transfusion    "related to OR"  . Hypertension   . Hypokalemia   . MI (myocardial infarction) ([place]) 1990; 1995; 1997  . OSA on CPAP    setting = 13, full face mask  . Osteoarthritis   . Persistent atrial fibrillation ([place]) 02/2016   on Eliquis  . Rheumatoid arthritis of multiple sites with  negative rheumatoid factor ([place])   . Septic shock ([place]) 02/2016   in setting of severe pneumonia    Past Surgical History:  Procedure Laterality Date  . BACK SURGERY    . CATARACT EXTRACTION W/ INTRAOCULAR LENS  IMPLANT, BILATERAL Bilateral 2000s  . CORONARY ANGIOPLASTY  early 70s  . CORONARY ANGIOPLASTY WITH STENT PLACEMENT     "I've got 15 stents" (07/19/2016)  . KNEE ARTHROSCOPY Left 1990s  . POSTERIOR LUMBAR FUSION  2015   L4-5-S1  . right knee surgery     age of 34  . TONSILLECTOMY AND ADENOIDECTOMY  1949    Social History    Socioeconomic History  . Marital status: Married    Spouse name: Not on file  . Number of children: Not on file  . Years of education: Not on file  . Highest education level: Not on file  Social Needs  . Financial resource strain: Not on file  . Food insecurity - worry: Not on file  . Food insecurity - inability: Not on file  . Transportation needs - medical: Not on file  . Transportation needs - non-medical: Not on file  Occupational History  . Occupation: retired Pharmacist, community  Tobacco Use  . Smoking status: Never Smoker  . Smokeless tobacco: Never Used  Substance and Sexual Activity  . Alcohol use: No  . Drug use: No  . Sexual activity: Not Currently  Other Topics Concern  . Not on file  Social History Narrative   [employer], Dentist retired on disability.  Lived in [place] but recently moved to [place].  6 sons, 21 grandchildren.    Allergies  Allergen Reactions  . Statins Other (See Comments)    Causes stiffness in joints   . Ace Inhibitors Cough  . Contrast Media [Iodinated Diagnostic Agents] Rash    Has to take benadryl prior to use  . Penicillins Hives and Rash    Has patient had a PCN reaction causing immediate rash, facial/tongue/throat swelling, SOB or lightheadedness with hypotension: Yes Has patient had a PCN reaction causing severe rash involving mucus membranes or skin necrosis: No Has patient had a PCN reaction that required hospitalization pt was in the hospital at time of last reaction - heart attack Has patient had a PCN reaction occurring within the last 10 years: No If all of the above answers are "NO", then may proceed with Cephalosporin use.    Family History  Problem Relation Age of Onset  . Heart failure Mother   . Hyperlipidemia Mother   . Hypertension Mother   . Heart attack Father   . Heart attack Brother     Prior to Admission medications   Medication Sig Start Date End Date Taking? Authorizing Provider  acetaminophen (TYLENOL) 325 MG  tablet Take 650 mg by mouth every 4 (four) hours as needed for mild pain or headache.   Yes [provider]  ALPRAZolam (NIRAVAM) 0.5 MG dissolvable tablet Take 1 tablet by mouth as needed (Take as directed).  07/26/16  Yes [provider]  apixaban (ELIQUIS) 5 MG TABS tablet Take 1 tablet (5 mg total) by mouth 2 (two) times daily. 04/01/16  Yes Nita Sells, MD  carvedilol (COREG) 6.25 MG tablet Take 1 tablet (6.25 mg total) by mouth 2 (two) times daily with a meal. 02/25/17  Yes Skeet Latch, MD  clotrimazole (LOTRIMIN) 1 % cream Apply 1 application topically 2 (two) times daily. Patient taking differently: Apply 1 application 2 (two) times daily as needed topically (  dry skin).  11/06/16  Yes Patrecia Pour, MD  Evolocumab (REPATHA SURECLICK) 564 MG/ML SOAJ Inject 140 mg into the skin every 14 (fourteen) days. 04/25/17  Yes Skeet Latch, MD  fenofibrate (TRICOR) 145 MG tablet Take 145 mg by mouth daily.   Yes [provider]  folic acid (FOLVITE) 1 MG tablet Take 2 mg by mouth daily.    Yes [provider]  furosemide (LASIX) 80 MG tablet Take 1 tablet (80 mg total) by mouth 2 (two) times daily. Patient taking differently: Take 100 mg 2 (two) times daily by mouth.  10/30/17  Yes Kilroy, Luke K, PA-C  gabapentin (NEURONTIN) 300 MG capsule Take 300 mg by mouth at bedtime.  08/22/16  Yes [provider]  hydrocortisone (ANUSOL-HC) 2.5 % rectal cream Place 1 application rectally 2 (two) times daily as needed for hemorrhoids or itching. 07/23/16  Yes Robbie Lis, MD  insulin lispro protamine-lispro (HUMALOG 50/50 MIX) (50-50) 100 UNIT/ML SUSP injection Inject 40-50 Units into the skin See admin instructions. Takes 50 units in am, 40 units midday, 50 units in pm   Yes [provider]  isosorbide mononitrate (IMDUR) 30 MG 24 hr tablet Take 0.5 tablets (15 mg total) by mouth daily. Patient taking differently: Take 15 mg 2 (two) times daily by  mouth.  10/30/17  Yes Kilroy, Luke K, PA-C  leucovorin (WELLCOVORIN) 10 MG tablet Take 10 mg by mouth See admin instructions. Take 1 tablet (10 mg) by mouth every 12 hours and 24 hours after the methotrexate dose   Yes [provider]  LUMIGAN 0.01 % SOLN Place 1 drop into both eyes at bedtime.  04/30/16  Yes [provider]  magnesium oxide (MAG-OX) 400 (241.3 Mg) MG tablet Take 1 tablet (400 mg total) by mouth 2 (two) times daily. 04/01/16  Yes Nita Sells, MD  Methotrexate Sodium (METHOTREXATE, PF,) 50 MG/2ML injection Inject 15 mg into the muscle every Friday.  03/09/16  Yes [provider]  Misc Natural Products (OSTEO BI-FLEX ADV DOUBLE ST PO) Take 1 tablet by mouth daily as needed (takes occassionally).    Yes [provider]  omega-3 acid ethyl esters (LOVAZA) 1 g capsule Take 1 capsule by mouth 2 (two) times daily. 05/29/16  Yes [provider]  omeprazole (PRILOSEC) 20 MG capsule Take 20 mg by mouth daily as needed (heartburn or acid reflux).   Yes [provider]  PARoxetine (PAXIL) 40 MG tablet Take 40 mg by mouth daily.  04/30/16  Yes [provider]  potassium chloride SA (K-DUR,KLOR-CON) 20 MEQ tablet Take 2 tablets (40 mEq total) 2 (two) times daily by mouth. 11/05/17  Yes Skeet Latch, MD  predniSONE (DELTASONE) 5 MG tablet Take 5 mg by mouth daily with breakfast.   Yes [provider]  RANEXA 500 MG 12 hr tablet TAKE 1 TABLET(500 MG) BY MOUTH TWICE DAILY 05/15/17  Yes Skeet Latch, MD  sulfaSALAzine (AZULFIDINE) 500 MG tablet Take 500 mg by mouth daily.   Yes [provider]  tamsulosin (FLOMAX) 0.4 MG CAPS capsule Take 0.4 mg by mouth daily.  02/21/16  Yes [provider]  tiZANidine (ZANAFLEX) 4 MG tablet Take 1 tablet by mouth every 8 (eight) hours as needed for muscle spasms.  05/29/16  Yes [provider]  topiramate (TOPAMAX) 25 MG tablet Take 25 mg by mouth 2 (two) times  daily.   Yes [provider]  traMADol (ULTRAM) 50 MG tablet Take 1 tablet by  mouth 3 (three) times daily as needed (pain). Take as directed 08/24/16  Yes [provider]  VITAMIN D, ERGOCALCIFEROL, PO Take 5,000 Units by mouth daily.    Yes [provider]  ZETIA 10 MG tablet Take 10 mg by mouth daily.  07/08/16  Yes [provider]  metolazone (ZAROXOLYN) 5 MG tablet TAKE DAILY FOR 3 DAYS 1 HOUR PRIOR TO FUROSEMIDE AND THEN AS DIRECTED Patient not taking: Reported on 11/13/2017 11/05/17   Skeet Latch, MD    Physical Exam: Vitals:   11/13/17 2015 11/13/17 2100 11/13/17 2116 11/13/17 2130  BP: 135/89 (!) 151/93 (!) 155/79 (!) 151/82  Pulse: (!) 104 99 90 97  Resp: (!) 25 (!) 21 18 17   Temp:      TempSrc:      SpO2: 97% 98% 97% 97%     General:  Appears calm and comfortable and is NAD Eyes:  PERRL, EOMI, normal lids, iris ENT:  grossly normal hearing, lips & tongue, mmm; appropriate dentition but missing a middle lower tooth Neck:  no LAD, masses or thyromegaly; no carotid bruits Cardiovascular:  RRR, no m/r/g. No LE edema.  Respiratory:   CTA bilaterally with no wheezes/rales/rhonchi.  Normal respiratory effort. Abdomen:  soft, NT, ND, NABS Back:   normal alignment, no CVAT Skin:  no rash or induration seen on limited exam Musculoskeletal:  grossly normal tone BUE/BLE, good ROM, no bony abnormality Lower extremity:  No LE edema.  Limited foot exam with no ulcerations.  2+ distal pulses. Psychiatric:  grossly normal mood and affect, speech fluent and appropriate, AOx3 Neurologic:  CN 2-12 grossly intact, moves all extremities in coordinated fashion, sensation intact    Radiological Exams on Admission: Dg Chest 2 View  Result Date: 11/13/2017 CLINICAL DATA:  Left-sided chest pain. EXAM: CHEST  2 VIEW COMPARISON:  08/30/2017 FINDINGS: There is moderate cardiac enlargement. No pleural effusion. Decreased lung volumes with asymmetric elevation  of right hemidiaphragm. Chronic reticular interstitial opacities are identified left-greater-than-right and are favored to represent sequelae of chronic interstitial lung disease. IMPRESSION: 1. Cardiac enlargement without evidence for heart failure. 2. Suspect chronic interstitial lung disease. No superimposed pulmonary opacities identified. Electronically Signed   By: Kerby Moors M.D.   On: 11/13/2017 16:36    EKG: Independently reviewed.  NSR with rate 97; IVCD, nonspecific ST changes with no evidence of acute ischemia; NSCSLT   Labs on Admission: I have personally reviewed the available labs and imaging studies at the time of the admission.  Pertinent labs:   Glucose 167, 188, 233 Na++ 130; 137 on 11/6 K+ 2.8 CO2 34 BUN 40/Creatinine 2.16/GFR 29; 24/2.17/30 on 11/6 Troponin 0.03 WBC 12.6   Assessment/Plan Principal Problem:   Chest pain Active Problems:   Paroxysmal atrial fibrillation (HCC)   Chronic combined systolic and diastolic CHF (congestive heart failure) (HCC)   CKD (chronic kidney disease) stage 3, GFR 30-59 ml/min (HCC)   Benign essential HTN   Dyslipidemia associated with type 2 diabetes mellitus (HCC)   Syncope   OSA on CPAP   Diabetes mellitus type 2, insulin dependent (HCC)   Chronic anticoagulation   Chest pain --Patient with left-sided chest pressure that has come on intermittently for days to weeks, appears to resolve spontaneously. -1/3 typical symptoms suggestive of noncardiac chest pain.  -CXR unremarkable.   -Initial cardiac troponin negative.  -EKG not indicative of acute ischemia. -Patient with SIGNIFICANT h/o CAD requiring intervention and multivessel disease on last cath 1 year ago.   -  HEART score is 5.  -Will plan to place in observation status on telemetry to rule out ACS by overnight observation.  -cycle troponin q6h x 3 and repeat EKG in AM -Continue Imdur, Ranexa, ASA, Repatha -morphine given -Risk factor stratification with HgbA1c  and FLP; will also check TSH and UDS -Cardiology consultation in AM - NPO for possible stress test/cath  -Mild ongoing chest discomfort 2/10 so will place in SDU  HTN -Takes low-dose Coreg monotherapy at home -Patient with suboptimal control while in the ER -No ACE due to renal failure -Will add prn hydralazine  HLD -Continue Repatha, Tricor, Lovaza, Zetia -Lipids were checked in 3/18 (TC 179, HDL 49, LDL 101, TG 144)  -Will repeat -Reported allergy to statins  DM -Last A1c was 7.7 in 11/17 -Recheck A1c -He takes 50/50 insulin; this is a very unusual choice.  Will change to Lantus 20 units qhs starting tonight. -Cover with moderate-scale SSI for now  Acute on chronic heart failure -Continue Lasix and Coreg -Unable to take ACE -Appears to be reasonably compensated at this time  Afib on Eliquis -Generally rate controlled -Hold Eliquis due to possible need for cath -CHA2DS2-VASc score is 5, 7.2% stroke rate per year -Heparin per pharmacy dosing for now  Syncope -Thought to be orthostatic/vagal reaction from NTG given at cardiology office -No further evaluation other than as above for now  OSA on CPAP -Continue CPAP, setting 13   DVT prophylaxis: Heparin drip Code Status: DNR - confirmed with patient Family Communication: None present Disposition Plan:  Home once clinically improved Consults called: Cardiology (may need to be called again as a reminder in the AM)  Admission status: It is my clinical opinion that referral for OBSERVATION is reasonable and necessary in this patient based on the above information provided. The aforementioned taken together are felt to place the patient at high risk for further clinical deterioration. However it is anticipated that the patient may be medically stable for discharge from the hospital within 24 to 48 hours.    Karmen Bongo MD [facility]  If note is complete, please contact covering daytime or nighttime  physician. [email or web address] Password [email or web address]  11/13/2017, 10:51 PM

## 2017-11-13 NOTE — Addendum Note (Signed)
Addended by: Alvina Filbert B on: 11/13/2017 06:22 PM   Modules accepted: Orders

## 2017-11-13 NOTE — ED Notes (Signed)
Sandwich and water given. 

## 2017-11-14 ENCOUNTER — Other Ambulatory Visit: Payer: Self-pay

## 2017-11-14 DIAGNOSIS — I5042 Chronic combined systolic (congestive) and diastolic (congestive) heart failure: Secondary | ICD-10-CM

## 2017-11-14 DIAGNOSIS — Z794 Long term (current) use of insulin: Secondary | ICD-10-CM

## 2017-11-14 DIAGNOSIS — M797 Fibromyalgia: Secondary | ICD-10-CM | POA: Diagnosis present

## 2017-11-14 DIAGNOSIS — R42 Dizziness and giddiness: Secondary | ICD-10-CM | POA: Diagnosis not present

## 2017-11-14 DIAGNOSIS — I130 Hypertensive heart and chronic kidney disease with heart failure and stage 1 through stage 4 chronic kidney disease, or unspecified chronic kidney disease: Secondary | ICD-10-CM | POA: Diagnosis not present

## 2017-11-14 DIAGNOSIS — Z955 Presence of coronary angioplasty implant and graft: Secondary | ICD-10-CM | POA: Diagnosis not present

## 2017-11-14 DIAGNOSIS — I480 Paroxysmal atrial fibrillation: Secondary | ICD-10-CM

## 2017-11-14 DIAGNOSIS — I481 Persistent atrial fibrillation: Secondary | ICD-10-CM | POA: Diagnosis not present

## 2017-11-14 DIAGNOSIS — Z66 Do not resuscitate: Secondary | ICD-10-CM | POA: Diagnosis present

## 2017-11-14 DIAGNOSIS — I25110 Atherosclerotic heart disease of native coronary artery with unstable angina pectoris: Secondary | ICD-10-CM | POA: Diagnosis not present

## 2017-11-14 DIAGNOSIS — E7800 Pure hypercholesterolemia, unspecified: Secondary | ICD-10-CM | POA: Diagnosis present

## 2017-11-14 DIAGNOSIS — E119 Type 2 diabetes mellitus without complications: Secondary | ICD-10-CM

## 2017-11-14 DIAGNOSIS — Z79899 Other long term (current) drug therapy: Secondary | ICD-10-CM | POA: Diagnosis not present

## 2017-11-14 DIAGNOSIS — E785 Hyperlipidemia, unspecified: Secondary | ICD-10-CM

## 2017-11-14 DIAGNOSIS — R0789 Other chest pain: Secondary | ICD-10-CM | POA: Diagnosis not present

## 2017-11-14 DIAGNOSIS — I10 Essential (primary) hypertension: Secondary | ICD-10-CM | POA: Diagnosis not present

## 2017-11-14 DIAGNOSIS — Z981 Arthrodesis status: Secondary | ICD-10-CM | POA: Diagnosis not present

## 2017-11-14 DIAGNOSIS — I200 Unstable angina: Secondary | ICD-10-CM | POA: Diagnosis not present

## 2017-11-14 DIAGNOSIS — Z888 Allergy status to other drugs, medicaments and biological substances status: Secondary | ICD-10-CM | POA: Diagnosis not present

## 2017-11-14 DIAGNOSIS — E1122 Type 2 diabetes mellitus with diabetic chronic kidney disease: Secondary | ICD-10-CM | POA: Diagnosis present

## 2017-11-14 DIAGNOSIS — R55 Syncope and collapse: Secondary | ICD-10-CM

## 2017-11-14 DIAGNOSIS — Z9989 Dependence on other enabling machines and devices: Secondary | ICD-10-CM

## 2017-11-14 DIAGNOSIS — E871 Hypo-osmolality and hyponatremia: Secondary | ICD-10-CM

## 2017-11-14 DIAGNOSIS — N183 Chronic kidney disease, stage 3 (moderate): Secondary | ICD-10-CM | POA: Diagnosis not present

## 2017-11-14 DIAGNOSIS — E876 Hypokalemia: Secondary | ICD-10-CM | POA: Diagnosis not present

## 2017-11-14 DIAGNOSIS — Z7952 Long term (current) use of systemic steroids: Secondary | ICD-10-CM | POA: Diagnosis not present

## 2017-11-14 DIAGNOSIS — Z8249 Family history of ischemic heart disease and other diseases of the circulatory system: Secondary | ICD-10-CM | POA: Diagnosis not present

## 2017-11-14 DIAGNOSIS — I509 Heart failure, unspecified: Secondary | ICD-10-CM | POA: Diagnosis not present

## 2017-11-14 DIAGNOSIS — G4733 Obstructive sleep apnea (adult) (pediatric): Secondary | ICD-10-CM

## 2017-11-14 DIAGNOSIS — F411 Generalized anxiety disorder: Secondary | ICD-10-CM | POA: Diagnosis present

## 2017-11-14 DIAGNOSIS — Z7901 Long term (current) use of anticoagulants: Secondary | ICD-10-CM

## 2017-11-14 DIAGNOSIS — E1169 Type 2 diabetes mellitus with other specified complication: Secondary | ICD-10-CM

## 2017-11-14 DIAGNOSIS — N184 Chronic kidney disease, stage 4 (severe): Secondary | ICD-10-CM | POA: Diagnosis present

## 2017-11-14 DIAGNOSIS — I252 Old myocardial infarction: Secondary | ICD-10-CM | POA: Diagnosis not present

## 2017-11-14 LAB — BASIC METABOLIC PANEL
ANION GAP: 11 (ref 5–15)
BUN: 35 mg/dL — ABNORMAL HIGH (ref 6–20)
CALCIUM: 9.7 mg/dL (ref 8.9–10.3)
CO2: 33 mmol/L — ABNORMAL HIGH (ref 22–32)
Chloride: 91 mmol/L — ABNORMAL LOW (ref 101–111)
Creatinine, Ser: 1.99 mg/dL — ABNORMAL HIGH (ref 0.61–1.24)
GFR, EST AFRICAN AMERICAN: 37 mL/min — AB (ref >60)
GFR, EST NON AFRICAN AMERICAN: 32 mL/min — AB (ref >60)
Glucose, Bld: 174 mg/dL — ABNORMAL HIGH (ref 65–99)
Potassium: 3 mmol/L — ABNORMAL LOW (ref 3.5–5.1)
SODIUM: 135 mmol/L (ref 135–145)

## 2017-11-14 LAB — CBC
HEMATOCRIT: 39.6 % (ref 39.0–52.0)
Hemoglobin: 13.4 g/dL (ref 13.0–17.0)
MCH: 31.5 pg (ref 26.0–34.0)
MCHC: 33.8 g/dL (ref 30.0–36.0)
MCV: 93 fL (ref 78.0–100.0)
PLATELETS: 323 10*3/uL (ref 150–400)
RBC: 4.26 MIL/uL (ref 4.22–5.81)
RDW: 13 % (ref 11.5–15.5)
WBC: 10.9 10*3/uL — AB (ref 4.0–10.5)

## 2017-11-14 LAB — LIPID PANEL
CHOL/HDL RATIO: 2.3 ratio
Cholesterol: 102 mg/dL (ref 0–200)
Cholesterol: 101 mg/dL (ref 0–200)
HDL: 45 mg/dL (ref >40)
HDL: 43 mg/dL (ref >40)
LDL CALC: 18 mg/dL (ref 0–99)
LDL Cholesterol: 26 mg/dL (ref 0–99)
Total CHOL/HDL Ratio: 2.3 RATIO
Triglycerides: 155 mg/dL — ABNORMAL HIGH (ref <150)
Triglycerides: 201 mg/dL — ABNORMAL HIGH (ref <150)
VLDL: 31 mg/dL (ref 0–40)
VLDL: 40 mg/dL (ref 0–40)

## 2017-11-14 LAB — GLUCOSE, CAPILLARY
GLUCOSE-CAPILLARY: 306 mg/dL — AB (ref 65–99)
GLUCOSE-CAPILLARY: 328 mg/dL — AB (ref 65–99)
Glucose-Capillary: 259 mg/dL — ABNORMAL HIGH (ref 65–99)
Glucose-Capillary: 192 mg/dL — ABNORMAL HIGH (ref 65–99)
Glucose-Capillary: 317 mg/dL — ABNORMAL HIGH (ref 65–99)

## 2017-11-14 LAB — TROPONIN I
Troponin I: 0.03 ng/mL (ref <0.03)
Troponin I: <0.03 ng/mL (ref <0.03)

## 2017-11-14 LAB — HEMOGLOBIN A1C
HEMOGLOBIN A1C: 7.9 % — AB (ref 4.8–5.6)
Mean Plasma Glucose: 180.03 mg/dL

## 2017-11-14 LAB — MAGNESIUM: Magnesium: 2.4 mg/dL (ref 1.7–2.4)

## 2017-11-14 LAB — TSH: TSH: 1.917 u[IU]/mL (ref 0.350–4.500)

## 2017-11-14 LAB — RAPID URINE DRUG SCREEN, HOSP PERFORMED
AMPHETAMINES: NOT DETECTED
Barbiturates: NOT DETECTED
Benzodiazepines: NOT DETECTED
COCAINE: NOT DETECTED
OPIATES: POSITIVE — AB
TETRAHYDROCANNABINOL: NOT DETECTED

## 2017-11-14 LAB — HEPARIN LEVEL (UNFRACTIONATED): Heparin Unfractionated: >2.2 IU/mL — ABNORMAL HIGH (ref 0.30–0.70)

## 2017-11-14 LAB — APTT
APTT: 53 s — AB (ref 24–36)
APTT: 87 s — AB (ref 24–36)

## 2017-11-14 LAB — MRSA PCR SCREENING: MRSA BY PCR: POSITIVE — AB

## 2017-11-14 MED ORDER — ASPIRIN 81 MG PO CHEW
81.0000 mg | CHEWABLE_TABLET | ORAL | Status: AC
  Administered 2017-11-15: 81 mg via ORAL
  Filled 2017-11-14: qty 1

## 2017-11-14 MED ORDER — POTASSIUM CHLORIDE 10 MEQ/100ML IV SOLN
10.0000 meq | INTRAVENOUS | Status: AC
  Administered 2017-11-14 (×2): 10 meq via INTRAVENOUS
  Filled 2017-11-14 (×2): qty 100

## 2017-11-14 MED ORDER — CHLORHEXIDINE GLUCONATE CLOTH 2 % EX PADS
6.0000 | MEDICATED_PAD | Freq: Every day | CUTANEOUS | Status: DC
  Administered 2017-11-14 – 2017-11-17 (×4): 6 via TOPICAL

## 2017-11-14 MED ORDER — MAGNESIUM OXIDE 400 (241.3 MG) MG PO TABS
400.0000 mg | ORAL_TABLET | Freq: Two times a day (BID) | ORAL | Status: DC
  Administered 2017-11-14 – 2017-11-17 (×8): 400 mg via ORAL
  Filled 2017-11-14 (×9): qty 1

## 2017-11-14 MED ORDER — CARVEDILOL 12.5 MG PO TABS
12.5000 mg | ORAL_TABLET | Freq: Two times a day (BID) | ORAL | Status: DC
  Administered 2017-11-14 – 2017-11-17 (×6): 12.5 mg via ORAL
  Filled 2017-11-14 (×6): qty 1

## 2017-11-14 MED ORDER — SODIUM CHLORIDE 0.9 % WEIGHT BASED INFUSION
1.0000 mL/kg/h | INTRAVENOUS | Status: DC
  Administered 2017-11-15: 1 mL/kg/h via INTRAVENOUS

## 2017-11-14 MED ORDER — SODIUM CHLORIDE 0.9% FLUSH: 3.0000 mL | INTRAVENOUS | Status: DC | PRN

## 2017-11-14 MED ORDER — MUPIROCIN 2 % EX OINT
TOPICAL_OINTMENT | CUTANEOUS | Status: AC
  Filled 2017-11-14: qty 22

## 2017-11-14 MED ORDER — ONDANSETRON HCL 4 MG/2ML IJ SOLN: 4.0000 mg | Freq: Four times a day (QID) | INTRAMUSCULAR | Status: DC | PRN

## 2017-11-14 MED ORDER — MUPIROCIN 2 % EX OINT
1.0000 "application " | TOPICAL_OINTMENT | Freq: Two times a day (BID) | CUTANEOUS | Status: DC
  Administered 2017-11-14 – 2017-11-17 (×7): 1 via NASAL
  Filled 2017-11-14 (×4): qty 22

## 2017-11-14 MED ORDER — SODIUM CHLORIDE 0.9 % WEIGHT BASED INFUSION
3.0000 mL/kg/h | INTRAVENOUS | Status: DC
  Administered 2017-11-15: 3 mL/kg/h via INTRAVENOUS

## 2017-11-14 MED ORDER — INFLUENZA VAC SPLIT HIGH-DOSE 0.5 ML IM SUSY
0.5000 mL | PREFILLED_SYRINGE | INTRAMUSCULAR | Status: DC
  Filled 2017-11-14: qty 0.5

## 2017-11-14 MED ORDER — SODIUM CHLORIDE 0.9 % IV SOLN: 250.0000 mL | INTRAVENOUS | Status: DC | PRN

## 2017-11-14 MED ORDER — SODIUM CHLORIDE 0.9% FLUSH
3.0000 mL | Freq: Two times a day (BID) | INTRAVENOUS | Status: DC
  Administered 2017-11-15: 3 mL via INTRAVENOUS

## 2017-11-14 NOTE — Care Management Note (Signed)
Case Management Note  Patient Details  Name: Ryann Leavitt MRN: [id number] Date of Birth: 30-Sep-1946  Subjective/Objective:   From home, presents with chest pain, pafib, hypokalemia, chronic chf, hld, ckd stage 3, DM2. Plan for cath tomorrow, conts on heparin drip. He wal already on eliquis at home pta.                  Action/Plan: NCM will follow for dc needs.   Expected Discharge Date:                  Expected Discharge Plan:  Home/Self Care  In-House Referral:     Discharge planning Services  CM Consult  Post Acute Care Choice:    Choice offered to:     DME Arranged:    DME Agency:     HH Arranged:    HH Agency:     Status of Service:  In process, will continue to follow  If discussed at Long Length of Stay Meetings, dates discussed:    Additional Comments:  Zenon Mayo, RN 11/14/2017, 4:12 PM

## 2017-11-14 NOTE — Progress Notes (Signed)
Patient was admitted to 3M03 with heparin gtt running at 13.5 ml/hr. According to the [facility] heparin was started at 0103. This RN received a call from pharmacy around [phone] stating that the heparin was not to be started until 1100 because eliquis is still in the patient's system. Per advise of RPh, this RN stopped the heparin gtt at 0710 and assessed patient for bleeding. Patient shows no s/s of bleeding at this time. Will continue to monitor.

## 2017-11-14 NOTE — ED Notes (Signed)
Message sent to pharmacy to verify the Evolocumab and send to POD E.

## 2017-11-14 NOTE — ED Notes (Addendum)
Attempted report and the receiving floor would not transfer this RN to the receiving RN. Took number and advised they would call back.

## 2017-11-14 NOTE — Progress Notes (Signed)
Progress Note    Julian Ross  LTJ:[id number] DOB: November 16, 1946  DOA: 11/13/2017 PCP: Reynold Bowen, MD    Brief Narrative:   Chief complaint: Follow-up chest pain  Medical records reviewed and are as summarized below:  Julian Ross is an 71 y.o. male with a PMH of RA, severe diffuse three-vessel CAD with stents 64, A. fib on", OSA on CPAP, hypertension, hyperlipidemia, diabetes, stage III CK D, and chronic systolic CHF with EF 07-62 percent echo on 11/04/17 was admitted 11/13/17 with chief complaint of chest pain which began in his cardiologist's office. EKG done at that time showed A. fib with prior inferior infarct. He was given a dose of nitroglycerin which caused a vagal response.  Assessment/Plan:   Principal Problem:   Chest pain in a patient with diffuse severe CAD Evaluation on admission showed a unremarkable chest x-ray, negative troponin, no ischemic changes on EKG. Continue Imdur, Ranexa, ASA, Repatha. Cardiology consultation called, evaluated by Dr Johnsie Cancel, now, with plans for cath tomorrow.  Active Problems:   MRSA carrier Decontamination therapy ordered.    Hyponatremia Likely from CHF physiology.    Hypokalemia Placed on routine supplementation.    Paroxysmal atrial fibrillation (HCC)/chronic anticoagulation CHA2DS2-VASc score is 5, 7.2% stroke rate per year. History of failed cardioversion. To start on IV heparin in anticipation of  cardiac catheterization. Continue Coreg with increased dose noted per cardiology.    Chronic combined systolic and diastolic CHF (congestive heart failure) (HCC) Continue Lasix and Coreg (dose increased). Hold metolazone.    CKD (chronic kidney disease) stage 3, GFR 30-59 ml/min (HCC) Baseline creatinine appears to be around 2.39 as of October, but was 1.69 back in March. Current creatinine 2.16. Monitor closely with diuresis.    Benign essential HTN Continue current antihypertensives. Systolic pressures in the 263F and  diastolic pressure in the low 90s. Hopefully will improve with increase Coreg dose.    Dyslipidemia associated with type 2 diabetes mellitus (HCC) Continue Repatha, Tricor, Lovaza, Zetia. Reported allergy to statins. Lipid panel as noted below, LDL 18.    Syncope Triggered by nitroglycerin and likely a vagal response.    OSA on CPAP Continue CPAP at night.    Diabetes mellitus type 2, insulin dependent (HCC) Body mass index is 29.83 kg/m. Currently being managed with moderate scale SSI. CBGs [id number]. Hemoglobin A1c is 7.9%.   Family Communication/Anticipated D/C date and plan/Code Status   DVT prophylaxis: Heparin ordered. Code Status: DO NOT RESUSCITATE.  Family Communication: No family present at the bedside. Disposition Plan: Home once plan of care fully defined.   Medical Consultants:    Cardiology   Anti-Infectives:    None  Subjective:   Denies dizziness, chest pain, dyspnea. Resting comfortably.  Objective:    Vitals:   11/14/17 0430 11/14/17 0500 11/14/17 0600 11/14/17 0700  BP: (!) 138/91 (!) 137/99 (!) 146/77 (!) 141/90  Pulse: 86 84 83   Resp: 15 18 (!) 22 (!) 21  Temp:   97.9 F (36.6 C) 98.1 F (36.7 C)  TempSrc:   Oral Oral  SpO2: 96% 97% 95% 98%  Weight:   97 kg (213 lb 13.5 oz)   Height:   5\' 11"  (1.803 m)    No intake or output data in the 24 hours ending 11/14/17 0812 Filed Weights   11/14/17 0600  Weight: 97 kg (213 lb 13.5 oz)    Exam: General: No acute distress. Cardiovascular: Heart sounds are irregular. No gallops or rubs. No murmurs.  No JVD. Lungs: Clear to auscultation bilaterally with good air movement. No rales, rhonchi or wheezes. Abdomen: Soft, nontender, nondistended with normal active bowel sounds. No masses. No hepatosplenomegaly. Neurological: Alert and oriented 3. Moves all extremities 4 with equal strength. Cranial nerves II through XII grossly intact. Skin: Warm and dry. No rashes or lesions. Extremities: No  clubbing or cyanosis. No edema. Pedal pulses 2+. Psychiatric: Mood and affect are normal. Insight and judgment are good.   Data Reviewed:   I have personally reviewed following labs and imaging studies:  Labs: Labs show the following:   Basic Metabolic Panel: Recent Labs  Lab 11/13/17 1635 11/14/17 0617  NA 130*  --   K 2.8*  --   CL 83*  --   CO2 34*  --   GLUCOSE 167*  --   BUN 40*  --   CREATININE 2.16*  --   CALCIUM 9.9  --   MG  --  2.4   GFR Estimated Creatinine Clearance: 37.3 mL/min (A) (by C-G formula based on SCr of 2.16 mg/dL (H)).  CBC: Recent Labs  Lab 11/13/17 1635 11/14/17 0112  WBC 12.6* 10.9*  HGB 13.9 13.4  HCT 40.3 39.6  MCV 93.3 93.0  PLT 326 323   Cardiac Enzymes: Recent Labs  Lab 11/14/17 0112 11/14/17 0617  TROPONINI 0.03* <0.03   CBG: Recent Labs  Lab 11/13/17 2026 11/13/17 2155  GLUCAP 188* 233*   Hgb A1c: Recent Labs    11/14/17 0112  HGBA1C 7.9*   Lipid Profile: Recent Labs    11/14/17 0112 11/14/17 0617  CHOL 102 101  HDL 45 43  LDLCALC 26 18  TRIG 155* 201*  CHOLHDL 2.3 2.3    Microbiology No results found for this or any previous visit (from the past 240 hour(s)).  Procedures and diagnostic studies:  11/13/17: Dg Chest 2 View: My independent review of the image shows: Cardiomegaly. No infiltrates. No effusions. No evidence of acute CHF.    Medications:   . aspirin EC  81 mg Oral Daily  . carvedilol  6.25 mg Oral BID WC  . Evolocumab  140 mg Subcutaneous Q14 Days  . ezetimibe  10 mg Oral Daily  . fenofibrate  160 mg Oral Daily  . folic acid  2 mg Oral Daily  . furosemide  100 mg Oral BID  . gabapentin  300 mg Oral QHS  . [START ON 11/15/2017] Influenza vac split quadrivalent PF  0.5 mL Intramuscular Tomorrow-1000  . insulin aspart  0-15 Units Subcutaneous TID WC  . insulin aspart  0-5 Units Subcutaneous QHS  . insulin glargine  20 Units Subcutaneous QHS  . isosorbide mononitrate  15 mg Oral BID   . latanoprost  1 drop Both Eyes QHS  . magnesium oxide  400 mg Oral BID  . omega-3 acid ethyl esters  1 capsule Oral BID  . PARoxetine  40 mg Oral Daily  . potassium chloride SA  40 mEq Oral BID  . predniSONE  5 mg Oral Q breakfast  . ranolazine  500 mg Oral BID  . sulfaSALAzine  500 mg Oral Daily  . tamsulosin  0.4 mg Oral Daily  . topiramate  25 mg Oral BID   Continuous Infusions: . heparin Stopped (11/14/17 0710)     LOS: 0 days   Aislee Landgren  [facility] Pager [phone]. If unable to reach me by pager, please call my cell phone at [phone].  *Please refer to [email or web address], password [facility]  to get updated schedule on who will round on this patient, as hospitalists switch teams weekly. If 7PM-7AM, please contact night-coverage at [email or web address], password [email or web address] for any overnight needs.  11/14/2017, 8:12 AM

## 2017-11-14 NOTE — Progress Notes (Signed)
Called by  Nurse  At 10 pm  Requesting  Pre  Cath orders, I reviewed  Notes and  Put in Pre cath orders

## 2017-11-14 NOTE — Progress Notes (Signed)
RT went to place PT on CPAP. PT brought home CPAP machine and placed on self.  When asked if pt needed anything pt stated stated no.

## 2017-11-14 NOTE — Consult Note (Signed)
Cardiology Consult    Patient ID: Julian Ross MRN: [id number], DOB/AGE: 01-May-1946   Admit date: 11/13/2017 Date of Consult: 11/14/2017  Primary Physician: Reynold Bowen, MD Primary Cardiologist: Skeet Latch, MD  Requesting Provider: Dr. Rockne Menghini  Reason for Consult: Chest pain  Patient Profile   Julian Ross is a 71 y.o. 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 [name] day of admission during which time he had chest pain, decreased consciousness, and diaphoresis relieved by nitro - he was sent to ED for evaluation.  Past Medical History   Past Medical History:  Diagnosis Date  . Anemia   . Anxiety state   . CAD (coronary artery disease)    16 prior stents (at [place])  . Cardiomyopathy, ischemic   . Chronic lower back pain   . Chronic systolic CHF (congestive heart failure) ([place])   . CKD (chronic kidney disease), stage III ([place])   . Depression   . Diabetes mellitus type 2 in obese ([place])   . Fibromyalgia   . Hepatitis A    at age 33  . High cholesterol   . History of blood transfusion    "related to OR"  . Hypertension   . Hypokalemia   . MI (myocardial infarction) ([place]) 1990; 1995; 1997  . OSA on CPAP    setting = 13, full face mask  . Osteoarthritis   . Persistent atrial fibrillation ([place]) 02/2016   on Eliquis  . Rheumatoid arthritis of multiple sites with negative rheumatoid factor ([place])   . Septic shock ([place]) 02/2016   in setting of severe pneumonia    Past Surgical History:  Procedure Laterality Date  . BACK SURGERY    . CARDIAC CATHETERIZATION N/A 10/03/2016   Procedure: Left Heart Cath and Coronary Angiography;  Surgeon: Belva Crome, MD;  Location: [facility];  Service: Cardiovascular;  Laterality: N/A;  . CARDIOVERSION N/A 06/04/2016   Procedure:  CARDIOVERSION;  Surgeon: Skeet Latch, MD;  Location: [facility];  Service: Cardiovascular;  Laterality: N/A;  . CATARACT EXTRACTION W/ INTRAOCULAR LENS  IMPLANT, BILATERAL Bilateral 2000s  . CORONARY ANGIOPLASTY  early 50s  . CORONARY ANGIOPLASTY WITH STENT PLACEMENT     "I've got 15 stents" (07/19/2016)  . KNEE ARTHROSCOPY Left 1990s  . POSTERIOR LUMBAR FUSION  2015   L4-5-S1  . right knee surgery     age of 45  . TONSILLECTOMY AND ADENOIDECTOMY  1949     Allergies  Allergies  Allergen Reactions  . Statins Other (See Comments)    Causes stiffness in joints   . Ace Inhibitors Cough  . Contrast Media [Iodinated Diagnostic Agents] Rash    Has to take benadryl prior to use  . Penicillins Hives and Rash    Has patient had a PCN reaction causing immediate rash, facial/tongue/throat swelling, SOB or lightheadedness with hypotension: Yes Has patient had a PCN reaction causing severe rash involving mucus membranes or skin necrosis: No Has patient had a PCN reaction that required hospitalization pt was in the hospital at time of last reaction - heart attack Has patient had a PCN reaction occurring within the last 10 years: No If all of the above answers are "NO", then may proceed with Cephalosporin use.    History of Present Illness  Julian Ross is a 71 yo male with significant cardiac history who presented to his cardiologist yesterday for regular follow up; during visit, he developed upper abd pain that radiated to his left chest and was sharp in nature. He was given nitro in office which caused hypotension and diaphoresis; he was transferred [facility]. Patient states that the pain lasted about an hour before it resolved and was associated initially with mild nausea. He denied radiation of the pain, shortness of breath and all other symptoms he endorses started after nitro administration.  Patient has been followed for CHF exacerbation outpatient and reports good  urine output and improvement in his shortness of breath, cough, and LE swelling.  Patient endorses compliance with his nightly CPAP; he endorses compliance with his eliquis and denies missed dosing. He denies mucosal bleeding, hematuria, hematochezia, melena. He denies palpitations since being started on eliquis.   Patient endorses a couple of weeks of loose stools; he denies vomiting or abdominal pain prior to admission.   Inpatient Medications    . aspirin EC  81 mg Oral Daily  . carvedilol  6.25 mg Oral BID WC  . Chlorhexidine Gluconate Cloth  6 each Topical Q0600  . Evolocumab  140 mg Subcutaneous Q14 Days  . ezetimibe  10 mg Oral Daily  . fenofibrate  160 mg Oral Daily  . folic acid  2 mg Oral Daily  . furosemide  100 mg Oral BID  . gabapentin  300 mg Oral QHS  . [START ON 11/15/2017] Influenza vac split quadrivalent PF  0.5 mL Intramuscular Tomorrow-1000  . insulin aspart  0-15 Units Subcutaneous TID WC  . insulin aspart  0-5 Units Subcutaneous QHS  . insulin glargine  20 Units Subcutaneous QHS  . isosorbide mononitrate  15 mg Oral BID  . latanoprost  1 drop Both Eyes QHS  . magnesium oxide  400 mg Oral BID  . mupirocin ointment  1 application Nasal BID  . omega-3 acid ethyl esters  1 capsule Oral BID  . PARoxetine  40 mg Oral Daily  . potassium chloride SA  40 mEq Oral BID  . predniSONE  5 mg Oral Q breakfast  . ranolazine  500 mg Oral BID  . sulfaSALAzine  500 mg Oral Daily  . tamsulosin  0.4 mg Oral Daily  . topiramate  25 mg Oral BID     Outpatient Medications    Prior to Admission medications   Medication Sig Start Date End Date Taking? Authorizing Provider  acetaminophen (TYLENOL) 325 MG tablet Take 650 mg by mouth every 4 (four) hours as needed for mild pain or headache.   Yes [provider]  ALPRAZolam (NIRAVAM) 0.5 MG dissolvable tablet Take 1 tablet by mouth as needed (Take as directed).  07/26/16  Yes [provider]  apixaban (ELIQUIS) 5  MG TABS tablet Take 1 tablet (5 mg total) by mouth 2 (two) times daily. 04/01/16  Yes Nita Sells, MD  carvedilol (COREG) 6.25 MG tablet Take 1 tablet (6.25 mg total) by mouth 2 (two) times daily with a meal. 02/25/17  Yes Skeet Latch, MD  clotrimazole (LOTRIMIN) 1 % cream Apply 1 application topically 2 (two) times daily. Patient taking differently: Apply 1 application 2 (two) times daily as needed topically (dry skin).  11/06/16  Yes Patrecia Pour, MD  Evolocumab (REPATHA SURECLICK) 381 MG/ML SOAJ Inject 140 mg into the skin every 14 (fourteen) days. 04/25/17  Yes Skeet Latch, MD  fenofibrate [facility]) 145  MG tablet Take 145 mg by mouth daily.   Yes [provider]  folic acid (FOLVITE) 1 MG tablet Take 2 mg by mouth daily.    Yes [provider]  furosemide (LASIX) 80 MG tablet Take 1 tablet (80 mg total) by mouth 2 (two) times daily. Patient taking differently: Take 100 mg 2 (two) times daily by mouth.  10/30/17  Yes Kilroy, Luke K, PA-C  gabapentin (NEURONTIN) 300 MG capsule Take 300 mg by mouth at bedtime.  08/22/16  Yes [provider]  hydrocortisone (ANUSOL-HC) 2.5 % rectal cream Place 1 application rectally 2 (two) times daily as needed for hemorrhoids or itching. 07/23/16  Yes Robbie Lis, MD  insulin lispro protamine-lispro (HUMALOG 50/50 MIX) (50-50) 100 UNIT/ML SUSP injection Inject 40-50 Units into the skin See admin instructions. Takes 50 units in am, 40 units midday, 50 units in pm   Yes [provider]  isosorbide mononitrate (IMDUR) 30 MG 24 hr tablet Take 0.5 tablets (15 mg total) by mouth daily. Patient taking differently: Take 15 mg 2 (two) times daily by mouth.  10/30/17  Yes Kilroy, Luke K, PA-C  leucovorin (WELLCOVORIN) 10 MG tablet Take 10 mg by mouth See admin instructions. Take 1 tablet (10 mg) by mouth every 12 hours and 24 hours after the methotrexate dose   Yes [provider]  LUMIGAN 0.01 % SOLN Place 1 drop  into both eyes at bedtime.  04/30/16  Yes [provider]  magnesium oxide (MAG-OX) 400 (241.3 Mg) MG tablet Take 1 tablet (400 mg total) by mouth 2 (two) times daily. 04/01/16  Yes Nita Sells, MD  Methotrexate Sodium (METHOTREXATE, PF,) 50 MG/2ML injection Inject 15 mg into the muscle every Friday.  03/09/16  Yes [provider]  Misc Natural Products (OSTEO BI-FLEX ADV DOUBLE ST PO) Take 1 tablet by mouth daily as needed (takes occassionally).    Yes [provider]  omega-3 acid ethyl esters (LOVAZA) 1 g capsule Take 1 capsule by mouth 2 (two) times daily. 05/29/16  Yes [provider]  omeprazole (PRILOSEC) 20 MG capsule Take 20 mg by mouth daily as needed (heartburn or acid reflux).   Yes [provider]  PARoxetine (PAXIL) 40 MG tablet Take 40 mg by mouth daily.  04/30/16  Yes [provider]  potassium chloride SA (K-DUR,KLOR-CON) 20 MEQ tablet Take 2 tablets (40 mEq total) 2 (two) times daily by mouth. 11/05/17  Yes Skeet Latch, MD  predniSONE (DELTASONE) 5 MG tablet Take 5 mg by mouth daily with breakfast.   Yes [provider]  RANEXA 500 MG 12 hr tablet TAKE 1 TABLET(500 MG) BY MOUTH TWICE DAILY 05/15/17  Yes Skeet Latch, MD  sulfaSALAzine (AZULFIDINE) 500 MG tablet Take 500 mg by mouth daily.   Yes [provider]  tamsulosin (FLOMAX) 0.4 MG CAPS capsule Take 0.4 mg by mouth daily.  02/21/16  Yes [provider]  tiZANidine (ZANAFLEX) 4 MG tablet Take 1 tablet by mouth every 8 (eight) hours as needed for muscle spasms.  05/29/16  Yes [provider]  topiramate (TOPAMAX) 25 MG tablet Take 25 mg by mouth 2 (two) times daily.   Yes [provider]  traMADol (ULTRAM) 50 MG tablet Take 1 tablet by mouth 3 (three) times daily as needed (pain). Take as directed 08/24/16  Yes [provider]  VITAMIN D, ERGOCALCIFEROL, PO Take 5,000 Units by mouth daily.    Yes [provider]  Chauncey Cruel  10 MG tablet Take 10 mg by mouth daily.  07/08/16  Yes [provider]   Family History    Reports father had TIA at age 65.  Family History  Problem Relation Age of Onset  . Heart failure Mother   . Hyperlipidemia Mother   . Hypertension Mother   . Heart attack Father   . Heart attack Brother    Social History    Social History   Socioeconomic History  . Marital status: Married    Spouse name: Not on file  . Number of children: Not on file  . Years of education: Not on file  . Highest education level: Not on file  Social Needs  . Financial resource strain: Not on file  . Food insecurity - worry: Not on file  . Food insecurity - inability: Not on file  . Transportation needs - medical: Not on file  . Transportation needs - non-medical: Not on file  Occupational History  . Occupation: retired Pharmacist, community  Tobacco Use  . Smoking status: Never Smoker  . Smokeless tobacco: Never Used  Substance and Sexual Activity  . Alcohol use: No  . Drug use: No  . Sexual activity: Not Currently  Other Topics Concern  . Not on file  Social History Narrative   [employer], Dentist retired on disability.  Lived in [place] but recently moved to [place].  6 sons, 21 grandchildren.     Review of Systems   All other systems reviewed and are otherwise negative except as noted above.  Physical Exam    Blood pressure 140/90, pulse 83, temperature 98.1 F (36.7 C), temperature source Oral, resp. rate 16, height 5\' 11"  (1.803 m), weight 213 lb 13.5 oz (97 kg), SpO2 97 %.  General: Pleasant, NAD, lying in bed comfortably (~15 degrees) Psych: Normal mood and affect. Neuro: Alert and oriented X 3. Moves all extremities spontaneously. HEENT: Normal, moist mucous membranes  Neck: Supple without bruits or JVD. Lungs:  Resp regular and unlabored, Bibasilar crackles. Heart: irregularly irregular rhythm, no murmurs, rubs or gallops. Abdomen: Soft, non-tender,  non-distended, decreased BS.  Extremities: No clubbing, cyanosis or edema. DP/PT/Radials 2+ and equal bilaterally.  Labs    Troponin [facility] of Care Test) Recent Labs    11/13/17 1651  TROPIPOC 0.03   Recent Labs    11/14/17 0112 11/14/17 0617  TROPONINI 0.03* <0.03   Lab Results  Component Value Date   WBC 10.9 (H) 11/14/2017   HGB 13.4 11/14/2017   HCT 39.6 11/14/2017   MCV 93.0 11/14/2017   PLT 323 11/14/2017    Recent Labs  Lab 11/13/17 1635  NA 130*  K 2.8*  CL 83*  CO2 34*  BUN 40*  CREATININE 2.16*  CALCIUM 9.9  GLUCOSE 167*   Lab Results  Component Value Date   CHOL 101 11/14/2017   HDL 43 11/14/2017   LDLCALC 18 11/14/2017   TRIG 201 (H) 11/14/2017   Lab Results  Component Value Date   DDIMER 0.81 (H) 03/19/2016     Radiology Studies    Dg Chest 2 View  Result Date: 11/13/2017 CLINICAL DATA:  Left-sided chest pain. EXAM: CHEST  2 VIEW COMPARISON:  08/30/2017 FINDINGS: There is moderate cardiac enlargement. No pleural effusion. Decreased lung volumes with asymmetric elevation of right hemidiaphragm. Chronic reticular interstitial opacities are identified left-greater-than-right and are favored to represent sequelae of chronic interstitial lung disease. IMPRESSION: 1. Cardiac enlargement without evidence for heart failure. 2. Suspect chronic interstitial lung disease.  No superimposed pulmonary opacities identified. Electronically Signed   By: Kerby Moors M.D.   On: 11/13/2017 16:36    ECG & Cardiac Imaging    LHC 10/03/16:  Ost Cx to Dist Cx lesion, 65 %stenosed.  2nd Mrg lesion, 75 %stenosed.  Dist Cx lesion, 80 %stenosed.  Dist LAD lesion, 90 %stenosed.  Mid LAD lesion, 80 %stenosed.  Prox LAD to Mid LAD lesion, 70 %stenosed.  1st Diag lesion, 75 %stenosed.  1st RPLB lesion, 100 %stenosed.  Prox RCA to Dist RCA lesion, 100 %stenosed.  LV end diastolic pressure is mildly elevated.  Severe diffuse three-vessel coronary disease  in this patient with multiple prior stents.  Total occlusion of the right coronary within the ostial segment. Distal vessel fills by collaterals from the left coronary. The distal right coronary is small and may not be graftable.  Severe diffuse LAD disease with 70% proximal stenosis, 80% mid stenosis, and 90% apical stenosis. The first diagonal which is relatively small contains segmental 70-80% stenosis.  Heavily stented proximal to mid circumflex coronary artery with 50-70% in-stent restenosis, diffuse. Large second obtuse marginal with 75% proximal diffuse narrowing. 70-80% segmental narrowing in the mid circumflex after the origin of the second obtuse marginal.  Mildly elevated LV EDP. Recent echo demonstrating EF greater than 50%. Contrast LV gram not performed due to chronic kidney disease.  Echo 11/04/17: - Left ventricle: The cavity size was normal. There was moderate   focal basal hypertrophy. Systolic function was mildly reduced.   The estimated ejection fraction was in the range of 45% to 50%.   There is akinesis of the entire inferolateral and basal and mid   inferior myocardium. The study is not technically sufficient to   allow evaluation of LV diastolic function. - Aortic valve: Trileaflet; normal thickness, mildly calcified   leaflets. - Mitral valve: There was mild regurgitation. - Left atrium: The atrium was mildly dilated. - Pulmonary arteries: PA peak pressure: 39 mm Hg (S).  EKG, personally reviewed - A fib, rate controlled with nonspecific TW changes  Telemetry, personally reviewed - a fib, rates in 90s-100s this AM  Assessment & Plan    Chest pain:  Patient with significant CAD not amenable to stenting on last cath in 2017, T2DM, HTN. Pain in office has been amenable to nitro. Will pursue cath; he may require bypass. --cath tomorrow --heparin per pharmacy - not started yet due to dose of eliquis last night  Paroxysmal A fib: CHA2DS2-VASc score of 5. Failed  cardioversion but has been rate controlled. At home he is on Eliquis but this has been switched to heparin gtt for anticipation of possible procedures this admission. On carvedilol. Rate in high 90s - low 100s this AM.  --increase carvedilol to 12.5mg  BID  Hypokalemia: Potassium was 2.8 on admission yesterday; Magnesium is 2.4. He has thus far received 62mEq of K. No repeat bmet today yet. --repeat Bmet  Chronic combined CHF: EF 45-50%; outpatient regimen of lasix and metolazone for fluid control; on carvedilol. ACE allergy. -- PO lasix 100mg  BID --inc carvedilol to 12.5mg  BID --hold metolazone  HLD: Continue repatha, tricor, lovaza, zetia; has allergy to statins  CKD stage 3: Overall worsened over last year but stable since Oct, Cr 2.16 on admission.  T2DM: Hold evening dose of Lantus.  Signed, Alphonzo Grieve, MD 11/14/2017, 11:11 AM   Patient examined chart reviewed Discussed care with patient and resident. Angina on good medical rx which includes ranexa, beta blocker and nitrates Reviewed  cath films from last year and faint collaterals to RCA not clear that PDA would be bypassable and has distal LAD lesion with apical LAD supplying Collaterals to RCA. LIMA may not help as it would be anastomosed to mid LAD likely. No acute ECG changes stable. Hold LA insulin tonight. Increase coreg Hold eliquis and plan cath in am with Dr Irish Lack. Last cath by Dr Tamala Julian from right radial and he has a great pulse. He is willing to [facility] if  Needed previously wife indicated considering [facility]  [facility] [place]

## 2017-11-14 NOTE — Progress Notes (Signed)
Pt placed on home CPAP unit. 

## 2017-11-14 NOTE — Progress Notes (Signed)
Writer offered to let pt watch the Card Cath Pt Education Video. Pt refused stating he has already watched the videos.

## 2017-11-14 NOTE — Progress Notes (Signed)
ANTICOAGULATION CONSULT NOTE - Follow Up Consult  Pharmacy Consult for heparin (PTA Eliquis on hold) Indication: atrial fibrillation  Allergies  Allergen Reactions  . Statins Other (See Comments)    Causes stiffness in joints   . Ace Inhibitors Cough  . Contrast Media [Iodinated Diagnostic Agents] Rash    Has to take benadryl prior to use  . Penicillins Hives and Rash    Has patient had a PCN reaction causing immediate rash, facial/tongue/throat swelling, SOB or lightheadedness with hypotension: Yes Has patient had a PCN reaction causing severe rash involving mucus membranes or skin necrosis: No Has patient had a PCN reaction that required hospitalization pt was in the hospital at time of last reaction - heart attack Has patient had a PCN reaction occurring within the last 10 years: No If all of the above answers are "NO", then may proceed with Cephalosporin use.   Patient Measurements: Heparin Dosing Weight: 96.2 kg   Vital Signs: Temp: 98.3 F (36.8 C) (11/15 1900) Temp Source: Axillary (11/15 1900) BP: 153/103 (11/15 1900)  Labs: Recent Labs    11/13/17 1635 11/14/17 0112 11/14/17 0310 11/14/17 0617 11/14/17 1301 11/14/17 1948  HGB 13.9 13.4  --   --   --   --   HCT 40.3 39.6  --   --   --   --   PLT 326 323  --   --   --   --   APTT  --   --  53*  --   --  87*  HEPARINUNFRC  --   --  >2.20*  --   --   --   CREATININE 2.16*  --   --   --  1.99*  --   TROPONINI  --  0.03*  --  <0.03 <0.03  --     Estimated Creatinine Clearance: 40.5 mL/min (A) (by C-G formula based on SCr of 1.99 mg/dL (H)).  Medications: Heparin @ 1350 units/hr  Assessment: 71 yo male admitted with chest pain. History of afib on Eliquis, with last dose 11/14 at 2230. Pharmacy consulted to dose heparin while Eliquis now on hold for possible cath.   Using aPTTs given eliquis influence on heparin levels. Initial aPTT therapeutic at 87 seconds.   Goal of Therapy:  Heparin level 0.3-0.7 units/ml   APTT 66-102s Monitor platelets by anticoagulation protocol: Yes   Plan:  1) Continue heparin at 1350 units/hr 2) Daily heparin level and aPTT  Nena Jordan, PharmD, BCPS 11/14/17 8:39 PM

## 2017-11-15 ENCOUNTER — Encounter (HOSPITAL_COMMUNITY)
Admission: EM | Disposition: A | Discharge status: Home / Self Care | Payer: Self-pay | Source: Home / Self Care | Attending: Internal Medicine

## 2017-11-15 DIAGNOSIS — I25110 Atherosclerotic heart disease of native coronary artery with unstable angina pectoris: Secondary | ICD-10-CM

## 2017-11-15 DIAGNOSIS — I509 Heart failure, unspecified: Secondary | ICD-10-CM

## 2017-11-15 HISTORY — PX: LEFT HEART CATH AND CORONARY ANGIOGRAPHY: CATH118249

## 2017-11-15 LAB — CBC
HEMATOCRIT: 39.1 % (ref 39.0–52.0)
Hemoglobin: 13.1 g/dL (ref 13.0–17.0)
MCH: 31.6 pg (ref 26.0–34.0)
MCHC: 33.5 g/dL (ref 30.0–36.0)
MCV: 94.4 fL (ref 78.0–100.0)
PLATELETS: 307 10*3/uL (ref 150–400)
RBC: 4.14 MIL/uL — ABNORMAL LOW (ref 4.22–5.81)
RDW: 13 % (ref 11.5–15.5)
WBC: 10.3 10*3/uL (ref 4.0–10.5)

## 2017-11-15 LAB — BASIC METABOLIC PANEL
Anion gap: 11 (ref 5–15)
BUN: 41 mg/dL — ABNORMAL HIGH (ref 6–20)
CALCIUM: 9.9 mg/dL (ref 8.9–10.3)
CO2: 34 mmol/L — AB (ref 22–32)
CREATININE: 2.38 mg/dL — AB (ref 0.61–1.24)
Chloride: 89 mmol/L — ABNORMAL LOW (ref 101–111)
GFR, EST AFRICAN AMERICAN: 30 mL/min — AB (ref >60)
GFR, EST NON AFRICAN AMERICAN: 26 mL/min — AB (ref >60)
Glucose, Bld: 290 mg/dL — ABNORMAL HIGH (ref 65–99)
Potassium: 3 mmol/L — ABNORMAL LOW (ref 3.5–5.1)
SODIUM: 134 mmol/L — AB (ref 135–145)

## 2017-11-15 LAB — HEPARIN LEVEL (UNFRACTIONATED)

## 2017-11-15 LAB — GLUCOSE, CAPILLARY
GLUCOSE-CAPILLARY: 275 mg/dL — AB (ref 65–99)
GLUCOSE-CAPILLARY: 313 mg/dL — AB (ref 65–99)
Glucose-Capillary: 260 mg/dL — ABNORMAL HIGH (ref 65–99)
Glucose-Capillary: 282 mg/dL — ABNORMAL HIGH (ref 65–99)
Glucose-Capillary: 367 mg/dL — ABNORMAL HIGH (ref 65–99)

## 2017-11-15 LAB — APTT: aPTT: 141 seconds — ABNORMAL HIGH (ref 24–36)

## 2017-11-15 LAB — PROTIME-INR
INR: 1.33
Prothrombin Time: 16.3 seconds — ABNORMAL HIGH (ref 11.4–15.2)

## 2017-11-15 SURGERY — LEFT HEART CATH AND CORONARY ANGIOGRAPHY
Anesthesia: LOCAL

## 2017-11-15 MED ORDER — DIPHENHYDRAMINE HCL 25 MG PO CAPS
50.0000 mg | ORAL_CAPSULE | ORAL | Status: AC
  Administered 2017-11-15: 50 mg via ORAL
  Filled 2017-11-15: qty 2

## 2017-11-15 MED ORDER — INSULIN GLARGINE 100 UNIT/ML ~~LOC~~ SOLN
20.0000 [IU] | Freq: Every day | SUBCUTANEOUS | Status: DC
  Administered 2017-11-15 – 2017-11-16 (×2): 20 [IU] via SUBCUTANEOUS
  Filled 2017-11-15 (×4): qty 0.2

## 2017-11-15 MED ORDER — HEPARIN (PORCINE) IN NACL 2-0.9 UNIT/ML-% IJ SOLN
INTRAMUSCULAR | Status: AC
  Filled 2017-11-15: qty 1000

## 2017-11-15 MED ORDER — SODIUM CHLORIDE 0.9 % IV SOLN: INTRAVENOUS | Status: AC

## 2017-11-15 MED ORDER — VERAPAMIL HCL 2.5 MG/ML IV SOLN
INTRAVENOUS | Status: DC | PRN
  Administered 2017-11-15: 16:00:00 via INTRA_ARTERIAL

## 2017-11-15 MED ORDER — FAMOTIDINE IN NACL 20-0.9 MG/50ML-% IV SOLN
INTRAVENOUS | Status: AC | PRN
  Administered 2017-11-15: 20 mg via INTRAVENOUS

## 2017-11-15 MED ORDER — HEPARIN SODIUM (PORCINE) 5000 UNIT/ML IJ SOLN
5000.0000 [IU] | Freq: Three times a day (TID) | INTRAMUSCULAR | Status: DC
  Administered 2017-11-16: 5000 [IU] via SUBCUTANEOUS
  Filled 2017-11-15: qty 1

## 2017-11-15 MED ORDER — IOPAMIDOL (ISOVUE-370) INJECTION 76%
INTRAVENOUS | Status: AC
  Filled 2017-11-15: qty 100

## 2017-11-15 MED ORDER — SODIUM CHLORIDE 0.9% FLUSH
3.0000 mL | Freq: Two times a day (BID) | INTRAVENOUS | Status: DC
  Administered 2017-11-15 – 2017-11-17 (×2): 3 mL via INTRAVENOUS

## 2017-11-15 MED ORDER — INSULIN ASPART 100 UNIT/ML ~~LOC~~ SOLN
0.0000 [IU] | SUBCUTANEOUS | Status: DC
  Administered 2017-11-15 (×2): 11 [IU] via SUBCUTANEOUS
  Administered 2017-11-15: 20 [IU] via SUBCUTANEOUS
  Administered 2017-11-16: 07:00:00 7 [IU] via SUBCUTANEOUS
  Administered 2017-11-16: 15 [IU] via SUBCUTANEOUS

## 2017-11-15 MED ORDER — HEPARIN (PORCINE) IN NACL 2-0.9 UNIT/ML-% IJ SOLN
INTRAMUSCULAR | Status: AC | PRN
  Administered 2017-11-15: 1000 mL

## 2017-11-15 MED ORDER — DIPHENHYDRAMINE HCL 50 MG/ML IJ SOLN
25.0000 mg | Freq: Four times a day (QID) | INTRAMUSCULAR | Status: DC | PRN
  Filled 2017-11-15: qty 1

## 2017-11-15 MED ORDER — SODIUM CHLORIDE 0.9% FLUSH: 3.0000 mL | INTRAVENOUS | Status: DC | PRN

## 2017-11-15 MED ORDER — VERAPAMIL HCL 2.5 MG/ML IV SOLN
INTRAVENOUS | Status: AC
  Filled 2017-11-15: qty 2

## 2017-11-15 MED ORDER — METHYLPREDNISOLONE SODIUM SUCC 125 MG IJ SOLR
60.0000 mg | INTRAMUSCULAR | Status: AC
  Administered 2017-11-15: 60 mg via INTRAVENOUS
  Filled 2017-11-15: qty 2

## 2017-11-15 MED ORDER — HEPARIN (PORCINE) IN NACL 100-0.45 UNIT/ML-% IJ SOLN
1150.0000 [IU]/h | INTRAMUSCULAR | Status: DC
  Administered 2017-11-15: 1150 [IU]/h via INTRAVENOUS

## 2017-11-15 MED ORDER — FENTANYL CITRATE (PF) 100 MCG/2ML IJ SOLN
INTRAMUSCULAR | Status: DC | PRN
  Administered 2017-11-15: 25 ug via INTRAVENOUS

## 2017-11-15 MED ORDER — HEPARIN SODIUM (PORCINE) 1000 UNIT/ML IJ SOLN
INTRAMUSCULAR | Status: AC
  Filled 2017-11-15: qty 1

## 2017-11-15 MED ORDER — ACETAMINOPHEN 325 MG PO TABS
650.0000 mg | ORAL_TABLET | ORAL | Status: DC | PRN
  Administered 2017-11-16: 650 mg via ORAL
  Filled 2017-11-15: qty 2

## 2017-11-15 MED ORDER — POTASSIUM CHLORIDE 10 MEQ/100ML IV SOLN
10.0000 meq | INTRAVENOUS | Status: AC
  Administered 2017-11-15 (×3): 10 meq via INTRAVENOUS
  Filled 2017-11-15 (×4): qty 100

## 2017-11-15 MED ORDER — HEPARIN SODIUM (PORCINE) 1000 UNIT/ML IJ SOLN
INTRAMUSCULAR | Status: DC | PRN
  Administered 2017-11-15: 5000 [IU] via INTRAVENOUS

## 2017-11-15 MED ORDER — FENTANYL CITRATE (PF) 100 MCG/2ML IJ SOLN
INTRAMUSCULAR | Status: AC
  Filled 2017-11-15: qty 2

## 2017-11-15 MED ORDER — MIDAZOLAM HCL 2 MG/2ML IJ SOLN
INTRAMUSCULAR | Status: AC
  Filled 2017-11-15: qty 2

## 2017-11-15 MED ORDER — ONDANSETRON HCL 4 MG/2ML IJ SOLN: 4.0000 mg | Freq: Four times a day (QID) | INTRAMUSCULAR | Status: DC | PRN

## 2017-11-15 MED ORDER — METHYLPREDNISOLONE SODIUM SUCC 125 MG IJ SOLR
60.0000 mg | Freq: Once | INTRAMUSCULAR | Status: AC
  Administered 2017-11-15: 60 mg via INTRAVENOUS
  Filled 2017-11-15: qty 2

## 2017-11-15 MED ORDER — SODIUM CHLORIDE 0.9 % IV SOLN: 250.0000 mL | INTRAVENOUS | Status: DC | PRN

## 2017-11-15 MED ORDER — LIDOCAINE HCL (PF) 1 % IJ SOLN
INTRAMUSCULAR | Status: DC | PRN
  Administered 2017-11-15: 5 mL

## 2017-11-15 MED ORDER — LIDOCAINE HCL (PF) 1 % IJ SOLN
INTRAMUSCULAR | Status: AC
  Filled 2017-11-15: qty 30

## 2017-11-15 MED ORDER — POTASSIUM CHLORIDE CRYS ER 20 MEQ PO TBCR
40.0000 meq | EXTENDED_RELEASE_TABLET | Freq: Three times a day (TID) | ORAL | Status: DC
  Administered 2017-11-15 – 2017-11-17 (×6): 40 meq via ORAL
  Filled 2017-11-15 (×6): qty 2

## 2017-11-15 MED ORDER — MIDAZOLAM HCL 2 MG/2ML IJ SOLN
INTRAMUSCULAR | Status: DC | PRN
  Administered 2017-11-15: 1 mg via INTRAVENOUS

## 2017-11-15 SURGICAL SUPPLY — 10 items
CATH INFINITI 5 FR JL3.5 (CATHETERS) ×2 IMPLANT
CATH INFINITI JR4 5F (CATHETERS) ×2 IMPLANT
DEVICE RAD COMP TR BAND LRG (VASCULAR PRODUCTS) ×2 IMPLANT
GLIDESHEATH SLEND SS 6F .021 (SHEATH) ×2 IMPLANT
GUIDEWIRE INQWIRE 1.5J.035X260 (WIRE) ×1 IMPLANT
INQWIRE 1.5J .035X260CM (WIRE) ×2
KIT HEART LEFT (KITS) ×2 IMPLANT
PACK CARDIAC CATHETERIZATION (CUSTOM PROCEDURE TRAY) ×2 IMPLANT
TRANSDUCER W/STOPCOCK (MISCELLANEOUS) ×2 IMPLANT
TUBING CIL FLEX 10 FLL-RA (TUBING) ×2 IMPLANT

## 2017-11-15 NOTE — Progress Notes (Signed)
Progress Note    Julian Ross  TTS:[id number] DOB: [birth year]  DOA: 11/13/2017 PCP: Reynold Bowen, MD    Brief Narrative:   Chief complaint: Follow-up chest pain  Medical records reviewed and are as summarized below:  Julian Ross is an 71 y.o. male with a PMH of RA, severe diffuse three-vessel CAD with stents 58, A. fib on", OSA on CPAP, hypertension, hyperlipidemia, diabetes, stage III CK D, and chronic systolic CHF with EF 09-23 percent echo on 11/04/17 was admitted 11/13/17 with chief complaint of chest pain which began in his cardiologist's office. EKG done at that time showed A. fib with prior inferior infarct. He was given a dose of nitroglycerin which caused a vagal response.  Assessment/Plan:   Principal Problem:   Chest pain in a patient with diffuse severe CAD Evaluation on admission showed a unremarkable chest x-ray, negative troponin, no ischemic changes on EKG. Continue Imdur, Ranexa, ASA, Repatha. Cardiology consultation called, evaluated by Dr Johnsie Cancel. Cath planned for today.  Active Problems:   MRSA carrier Decontamination therapy ordered.    Hyponatremia Likely from CHF physiology. Mild, stable.    Hypokalemia Placed on routine supplementation. Will need to increase supplementation dose given persistent hypokalemia.    Paroxysmal atrial fibrillation (HCC)/chronic anticoagulation CHA2DS2-VASc score is 5, 7.2% stroke rate per year. History of failed cardioversion. Continue IV heparin in anticipation of cardiac catheterization. Continue Coreg with increased dose noted per cardiology.    Chronic combined systolic and diastolic CHF (congestive heart failure) (HCC) Continue Lasix and Coreg (dose increased). Hold metolazone.    CKD (chronic kidney disease) stage 3, GFR 30-59 ml/min (HCC) Baseline creatinine appears to be around 2.39 as of October, but was 1.69 back in March. Current creatinine 2.38. Monitor closely with diuresis.    Benign essential  HTN Continue current antihypertensives. Systolic pressures remain in the 300T and diastolic pressure in the low 90s. Coreg dose increased 11/14/17.    Dyslipidemia associated with type 2 diabetes mellitus (HCC) Continue Repatha, Tricor, Lovaza, Zetia. Reported allergy to statins. Lipid panel as noted below, LDL 18.    Syncope Triggered by nitroglycerin and likely a vagal response. No recurrent symptoms.    OSA on CPAP Continue CPAP at night.    Diabetes mellitus type 2, insulin dependent (HCC) Body mass index is 29.83 kg/m. Currently being managed with moderate scale SSI. CBGs 192-328. Add Lantus and change SSI due to resistant scale. Hemoglobin A1c is 7.9%.   Family Communication/Anticipated D/C date and plan/Code Status   DVT prophylaxis: Heparin ordered. Code Status: DO NOT RESUSCITATE.  Family Communication: No family present at the bedside. Disposition Plan: Home once plan of care fully defined.   Medical Consultants:    Cardiology   Anti-Infectives:    None  Subjective:   Continues to deny dizziness, chest pain, dyspnea.   Objective:    Vitals:   11/14/17 2359 11/15/17 0000 11/15/17 0300 11/15/17 0748  BP:  (!) 131/91  (!) 146/94  Pulse:      Resp:  20  19  Temp: 98.4 F (36.9 C)  98.6 F (37 C) 97.7 F (36.5 C)  TempSrc: Oral  Oral Oral  SpO2:  100%  98%  Weight:      Height:        Intake/Output Summary (Last 24 hours) at 11/15/2017 0822 Last data filed at 11/15/2017 0645 Gross per 24 hour  Intake 428.31 ml  Output 1575 ml  Net -1146.69 ml   [employer]  11/14/17 0600  Weight: 97 kg (213 lb 13.5 oz)    Exam: (Unchanged from 11/14/17) General: No acute distress. Cardiovascular: Heart sounds are irregular. No gallops or rubs. No murmurs. No JVD. Lungs: Clear to auscultation bilaterally with good air movement. No rales, rhonchi or wheezes. Abdomen: Soft, nontender, nondistended with normal active bowel sounds. No masses. No  hepatosplenomegaly. Neurological: Alert and oriented 3. Moves all extremities 4 with equal strength. Cranial nerves II through XII grossly intact. Skin: Warm and dry. No rashes or lesions. Extremities: No clubbing or cyanosis. No edema. Pedal pulses 2+. Psychiatric: Mood and affect are normal. Insight and judgment are good.   Data Reviewed:   I have personally reviewed following labs and imaging studies:  Labs: Labs show the following:   Basic Metabolic Panel: Recent Labs  Lab 11/13/17 1635 11/14/17 0617 11/14/17 1301 11/15/17 0437  NA 130*  --  135 134*  K 2.8*  --  3.0* 3.0*  CL 83*  --  91* 89*  CO2 34*  --  33* 34*  GLUCOSE 167*  --  174* 290*  BUN 40*  --  35* 41*  CREATININE 2.16*  --  1.99* 2.38*  CALCIUM 9.9  --  9.7 9.9  MG  --  2.4  --   --    GFR Estimated Creatinine Clearance: 33.8 mL/min (A) (by C-G formula based on SCr of 2.38 mg/dL (H)).  CBC: Recent Labs  Lab 11/13/17 1635 11/14/17 0112 11/15/17 0437  WBC 12.6* 10.9* 10.3  HGB 13.9 13.4 13.1  HCT 40.3 39.6 39.1  MCV 93.3 93.0 94.4  PLT 326 323 307   Cardiac Enzymes: Recent Labs  Lab 11/14/17 0112 11/14/17 0617 11/14/17 1301  TROPONINI 0.03* <0.03 <0.03   CBG: Recent Labs  Lab 11/14/17 1130 11/14/17 1611 11/14/17 1954 11/14/17 2354 11/15/17 0346  GLUCAP 259* 192* 317* 328* 275*   Hgb A1c: Recent Labs    11/14/17 0112  HGBA1C 7.9*   Lipid Profile: Recent Labs    11/14/17 0112 11/14/17 0617  CHOL 102 101  HDL 45 43  LDLCALC 26 18  TRIG 155* 201*  CHOLHDL 2.3 2.3    Microbiology Recent Results (from the past 240 hour(s))  MRSA PCR Screening     Status: Abnormal   Collection Time: 11/14/17  6:41 AM  Result Value Ref Range Status   MRSA by PCR POSITIVE (A) NEGATIVE Final    Comment:        The GeneXpert MRSA Assay (FDA approved for NASAL specimens only), is one component of a comprehensive MRSA colonization surveillance program. It is not intended to diagnose  MRSA infection nor to guide or monitor treatment for MRSA infections. RESULT CALLED TO, READ BACK BY AND VERIFIED WITH: D. Muhoro RN 9:50 11/14/17 ([email or web address])     Procedures and diagnostic studies:  11/13/17: Dg Chest 2 View: My independent review of the image shows: Cardiomegaly. No infiltrates. No effusions. No evidence of acute CHF.    Medications:   . aspirin EC  81 mg Oral Daily  . carvedilol  12.5 mg Oral BID WC  . Chlorhexidine Gluconate Cloth  6 each Topical Q0600  . Evolocumab  140 mg Subcutaneous Q14 Days  . ezetimibe  10 mg Oral Daily  . fenofibrate  160 mg Oral Daily  . folic acid  2 mg Oral Daily  . furosemide  100 mg Oral BID  . gabapentin  300 mg Oral QHS  . Influenza vac split quadrivalent PF  0.5 mL Intramuscular Tomorrow-1000  . insulin aspart  0-15 Units Subcutaneous TID WC  . insulin aspart  0-5 Units Subcutaneous QHS  . isosorbide mononitrate  15 mg Oral BID  . latanoprost  1 drop Both Eyes QHS  . magnesium oxide  400 mg Oral BID  . mupirocin ointment  1 application Nasal BID  . omega-3 acid ethyl esters  1 capsule Oral BID  . PARoxetine  40 mg Oral Daily  . potassium chloride SA  40 mEq Oral BID  . predniSONE  5 mg Oral Q breakfast  . ranolazine  500 mg Oral BID  . sodium chloride flush  3 mL Intravenous Q12H  . sulfaSALAzine  500 mg Oral Daily  . tamsulosin  0.4 mg Oral Daily  . topiramate  25 mg Oral BID   Continuous Infusions: . sodium chloride    . sodium chloride 1 mL/kg/hr (11/15/17 0802)  . heparin 1,150 Units/hr (11/15/17 0801)     LOS: 1 day   Jacquelynn Cree  [facility] Pager [phone]. If unable to reach me by pager, please call my cell phone at [phone].  *Please refer to [email or web address], password [email or web address] to get updated schedule on who will round on this patient, as hospitalists switch teams weekly. If 7PM-7AM, please contact night-coverage at [email or web address], password [email or web address] for any overnight needs.  11/15/2017, 8:22 AM

## 2017-11-15 NOTE — Progress Notes (Addendum)
ANTICOAGULATION CONSULT NOTE - Follow Up Consult  Pharmacy Consult for heparin (PTA Eliquis on hold) Indication: atrial fibrillation  Allergies  Allergen Reactions  . Statins Other (See Comments)    Causes stiffness in joints   . Ace Inhibitors Cough  . Contrast Media [Iodinated Diagnostic Agents] Rash    Has to take benadryl prior to use  . Penicillins Hives and Rash    Has patient had a PCN reaction causing immediate rash, facial/tongue/throat swelling, SOB or lightheadedness with hypotension: Yes Has patient had a PCN reaction causing severe rash involving mucus membranes or skin necrosis: No Has patient had a PCN reaction that required hospitalization pt was in the hospital at time of last reaction - heart attack Has patient had a PCN reaction occurring within the last 10 years: No If all of the above answers are "NO", then may proceed with Cephalosporin use.   Patient Measurements: Heparin Dosing Weight: 96.2 kg   Vital Signs: Temp: 98.6 F (37 C) (11/16 0300) Temp Source: Oral (11/16 0300) BP: 131/91 (11/16 0000) Pulse Rate: 83 (11/15 2100)  Labs: Recent Labs    11/13/17 1635 11/14/17 0112 11/14/17 0310 11/14/17 0617 11/14/17 1301 11/14/17 1948 11/15/17 0437  HGB 13.9 13.4  --   --   --   --  13.1  HCT 40.3 39.6  --   --   --   --  39.1  PLT 326 323  --   --   --   --  307  APTT  --   --  53*  --   --  87* 141*  LABPROT  --   --   --   --   --   --  16.3*  INR  --   --   --   --   --   --  1.33  HEPARINUNFRC  --   --  >2.20*  --   --   --  >2.20*  CREATININE 2.16*  --   --   --  1.99*  --  2.38*  TROPONINI  --  0.03*  --  <0.03 <0.03  --   --     Estimated Creatinine Clearance: 33.8 mL/min (A) (by C-G formula based on SCr of 2.38 mg/dL (H)).  Assessment: 71 yo male admitted with chest pain. History of afib on Eliquis, with last dose 11/14 at 2230. Pharmacy consulted to dose heparin while Eliquis now on hold for possible cath.   Using aPTTs given eliquis  influence on heparin levels.  AM aPTT is supratherapeutic at 141. RN/patient reports lab drawn in arm opposite heparin gtt. CBC stable and no bleeding per RN.    Also noted SCr up from 1.99 > 2.38.   Goal of Therapy:  Heparin level 0.3-0.7 units/ml  APTT 66-102s Monitor platelets by anticoagulation protocol: Yes   Plan:  Hold heparin gtt x1 hr and then resume at 1150 units/hr aPTT 8 hrs after resumed  Daily heparin level, aPTT, and CBC Monitor for s/s bleeding  Lavonda Jumbo, PharmD Clinical Pharmacist 11/15/17 6:41 AM

## 2017-11-15 NOTE — Progress Notes (Signed)
Progress Note  Patient Name: Julian Ross Date of Encounter: 11/15/2017  Primary Cardiologist: Skeet Latch, MD  Subjective   Feels well; denies further chest pain or abd pain; no shortness of breath.   Inpatient Medications    Scheduled Meds: . aspirin EC  81 mg Oral Daily  . carvedilol  12.5 mg Oral BID WC  . Chlorhexidine Gluconate Cloth  6 each Topical Q0600  . Evolocumab  140 mg Subcutaneous Q14 Days  . ezetimibe  10 mg Oral Daily  . fenofibrate  160 mg Oral Daily  . folic acid  2 mg Oral Daily  . furosemide  100 mg Oral BID  . gabapentin  300 mg Oral QHS  . Influenza vac split quadrivalent PF  0.5 mL Intramuscular Tomorrow-1000  . insulin aspart  0-20 Units Subcutaneous Q4H  . insulin glargine  20 Units Subcutaneous QHS  . isosorbide mononitrate  15 mg Oral BID  . latanoprost  1 drop Both Eyes QHS  . magnesium oxide  400 mg Oral BID  . mupirocin ointment  1 application Nasal BID  . omega-3 acid ethyl esters  1 capsule Oral BID  . PARoxetine  40 mg Oral Daily  . potassium chloride SA  40 mEq Oral BID  . predniSONE  5 mg Oral Q breakfast  . ranolazine  500 mg Oral BID  . sodium chloride flush  3 mL Intravenous Q12H  . sulfaSALAzine  500 mg Oral Daily  . tamsulosin  0.4 mg Oral Daily  . topiramate  25 mg Oral BID   Continuous Infusions: . sodium chloride    . sodium chloride 1 mL/kg/hr (11/15/17 0802)  . heparin 1,150 Units/hr (11/15/17 0801)  . potassium chloride     PRN Meds: sodium chloride, acetaminophen, ALPRAZolam, diphenhydrAMINE, gi cocktail, hydrALAZINE, morphine injection, ondansetron (ZOFRAN) IV, sodium chloride flush, tiZANidine, traMADol   Vital Signs    Vitals:   11/14/17 2359 11/15/17 0000 11/15/17 0300 11/15/17 0748  BP:  (!) 131/91  (!) 146/94  Pulse:      Resp:  20  19  Temp: 98.4 F (36.9 C)  98.6 F (37 C) 97.7 F (36.5 C)  TempSrc: Oral  Oral Oral  SpO2:  100%  98%  Weight:      Height:        Intake/Output Summary  (Last 24 hours) at 11/15/2017 0851 Last data filed at 11/15/2017 0645 Gross per 24 hour  Intake 428.31 ml  Output 1575 ml  Net -1146.69 ml   Filed Weights   11/14/17 0600  Weight: 213 lb 13.5 oz (97 kg)    Telemetry    A fib, rates in mid 80s mostly - Personally Reviewed  ECG    N/a today  Physical Exam   GEN: No acute distress.   Neck: No JVD Cardiac: irregularly irregular rhythm, no murmurs, rubs, or gallops. Radial pulses 2+ bil Respiratory: Clear to auscultation bilaterally, no rales or wheezes, no increased work of breathing. GI: Soft, nontender, non-distended  MS: minimal LE edema Neuro:  Nonfocal  Psych: Normal affect   Labs    Chemistry Recent Labs  Lab 11/13/17 1635 11/14/17 1301 11/15/17 0437  NA 130* 135 134*  K 2.8* 3.0* 3.0*  CL 83* 91* 89*  CO2 34* 33* 34*  GLUCOSE 167* 174* 290*  BUN 40* 35* 41*  CREATININE 2.16* 1.99* 2.38*  CALCIUM 9.9 9.7 9.9  GFRNONAA 29* 32* 26*  GFRAA 34* 37* 30*  ANIONGAP 13 11 11  Hematology Recent Labs  Lab 11/13/17 1635 11/14/17 0112 11/15/17 0437  WBC 12.6* 10.9* 10.3  RBC 4.32 4.26 4.14*  HGB 13.9 13.4 13.1  HCT 40.3 39.6 39.1  MCV 93.3 93.0 94.4  MCH 32.2 31.5 31.6  MCHC 34.5 33.8 33.5  RDW 13.0 13.0 13.0  PLT 326 323 307    Cardiac Enzymes Recent Labs  Lab 11/14/17 0112 11/14/17 0617 11/14/17 1301  TROPONINI 0.03* <0.03 <0.03    Recent Labs  Lab 11/13/17 1651  TROPIPOC 0.03     BNPNo results for input(s): BNP, PROBNP in the last 168 hours.   DDimer No results for input(s): DDIMER in the last 168 hours.   Radiology    Dg Chest 2 View  Result Date: 11/13/2017 CLINICAL DATA:  Left-sided chest pain. EXAM: CHEST  2 VIEW COMPARISON:  08/30/2017 FINDINGS: There is moderate cardiac enlargement. No pleural effusion. Decreased lung volumes with asymmetric elevation of right hemidiaphragm. Chronic reticular interstitial opacities are identified left-greater-than-right and are favored to  represent sequelae of chronic interstitial lung disease. IMPRESSION: 1. Cardiac enlargement without evidence for heart failure. 2. Suspect chronic interstitial lung disease. No superimposed pulmonary opacities identified. Electronically Signed   By: Kerby Moors M.D.   On: 11/13/2017 16:36    Cardiac Studies   LHC 10/03/16:  Ost Cx to Dist Cx lesion, 65 %stenosed.  2nd Mrg lesion, 75 %stenosed.  Dist Cx lesion, 80 %stenosed.  Dist LAD lesion, 90 %stenosed.  Mid LAD lesion, 80 %stenosed.  Prox LAD to Mid LAD lesion, 70 %stenosed.  1st Diag lesion, 75 %stenosed.  1st RPLB lesion, 100 %stenosed.  Prox RCA to Dist RCA lesion, 100 %stenosed.  LV end diastolic pressure is mildly elevated.  Severe diffuse three-vessel coronary disease in this patient with multiple prior stents.  Total occlusion of the right coronary within the ostial segment. Distal vessel fills by collaterals from the left coronary. The distal right coronary is small and may not be graftable.  Severe diffuse LAD disease with 70% proximal stenosis, 80% mid stenosis, and 90% apical stenosis. The first diagonal which is relatively small contains segmental 70-80% stenosis.  Heavily stented proximal to mid circumflex coronary artery with 50-70% in-stent restenosis, diffuse. Large second obtuse marginal with 75% proximal diffuse narrowing. 70-80% segmental narrowing in the mid circumflex after the origin of the second obtuse marginal.  Mildly elevated LV EDP. Recent echo demonstrating EF greater than 50%. Contrast LV gram not performed due to chronic kidney disease.  Echo 11/04/17: - Left ventricle: The cavity size was normal. There was moderate focal basal hypertrophy. Systolic function was mildly reduced. The estimated ejection fraction was in the range of 45% to 50%. There is akinesis of the entire inferolateral and basal and mid inferior myocardium. The study is not technically sufficient to allow  evaluation of LV diastolic function. - Aortic valve: Trileaflet; normal thickness, mildly calcified leaflets. - Mitral valve: There was mild regurgitation. - Left atrium: The atrium was mildly dilated. - Pulmonary arteries: PA peak pressure: 39 mm Hg (S).  Patient Profile     Julian Ross is a 71 y.o. 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 [name] day of admission during which time he had chest pain, decreased consciousness, and diaphoresis relieved by nitro - he was sent to ED for  evaluation.  Assessment & Plan    Chest pain:  Patient with significant CAD not amenable to stenting on last cath in 2017, T2DM, HTN. Pain in office has been amenable to nitro. Will pursue cath; he may require bypass and he is agreeable to pursuing it at this hospital. Hgb stable, Cr mildly elevated from yesterday but overall stable; has mild allergy to IV contrast (rash); radial pulses good strong. --cath today; getting benadryl due to mild contrast allergy - per patient, has not needed any other premedication apart from benadryl for prior caths --heparin per pharmacy  Paroxysmal A fib: CHA2DS2-VASc score of 5. Failed cardioversion but has been rate controlled. At home he is on Eliquis but this has been switched to heparin gtt for cath. On carvedilol.  --carvedilol 12.5mg  BID; we might be able to increase to 25mg  BID but will hold off for now  Hypokalemia: K 3.0 despite PO and IV K. Giving 3 runs of KCl prior to cath today in addition to his PO K. Magnesium has been stable.  Chronic combined CHF: EF 45-50%; outpatient regimen of lasix and metolazone for fluid control; on carvedilol. ACE allergy. --PO lasix 100mg  BID --carvedilol 12.5mg  BID --hold metolazone  HLD: Continue repatha, tricor, lovaza, zetia; has allergy to  statins  CKD stage 3: Overall worsened over last year but stable since Oct, Cr 2.16>2.3.  For questions or updates, please contact [place] Please consult [email or web address] for contact info under Cardiology/STEMI.   Signed, Alphonzo Grieve, MD  11/15/2017, 8:51 AM    See separate progress note from today For cath Premedicated for dye allergy supplementing K  Jenkins Rouge

## 2017-11-15 NOTE — Progress Notes (Signed)
Day of Surgery Procedure(s) (LRB): LEFT HEART CATH AND CORONARY ANGIOGRAPHY (N/A) Subjective: Patient examined, coronary angiogram images performed today personally reviewed and discussed with patient  Very nice 71 year old diabetic with history of atrial fibrillation, severe diabetes with nephropathy and sleep apnea.  He is admitted for outpatient catheterization due to symptoms of heart failure as well as chest pain. Patient has severe diffuse diabetic disease with total occlusion of the RCA, diffuse disease of the LAD to the apex and with OM branches of the circumflex being poor targets.  Patient would not benefit from CABG.  Objective: Vital signs in last 24 hours: Temp:  [97.5 F (36.4 C)-98.6 F (37 C)] 97.5 F (36.4 C) (11/16 1636) Pulse Rate:  [82-99] 82 (11/16 1636) Cardiac Rhythm: Atrial fibrillation (11/16 2010) Resp:  [8-20] 12 (11/16 1636) BP: (131-161)/(81-106) 144/87 (11/16 1636) SpO2:  [94 %-100 %] 94 % (11/16 1636) Weight:  [213 lb 13.5 oz (97 kg)] 213 lb 13.5 oz (97 kg) (11/16 1636)  Hemodynamic parameters for last 24 hours:    Intake/Output from previous day: 11/15 0701 - 11/16 0700 In: 428.3 [I.V.:428.3] Out: 1575 [Urine:1575] Intake/Output this shift: No intake/output data recorded.  Exam  Chronically ill-appearing male CPAP mask at bedside No bleeding from right radial artery cath site   Lab Results: Recent Labs    11/14/17 0112 11/15/17 0437  WBC 10.9* 10.3  HGB 13.4 13.1  HCT 39.6 39.1  PLT 323 307   BMET:  Recent Labs    11/14/17 1301 11/15/17 0437  NA 135 134*  K 3.0* 3.0*  CL 91* 89*  CO2 33* 34*  GLUCOSE 174* 290*  BUN 35* 41*  CREATININE 1.99* 2.38*  CALCIUM 9.7 9.9    PT/INR:  Recent Labs    11/15/17 0437  LABPROT 16.3*  INR 1.33   ABG    Component Value Date/Time   PHART 7.445 10/31/2016 1045   HCO3 24.3 10/31/2016 1045   TCO2 24 10/30/2016 1225   ACIDBASEDEF 7.0 (H) 03/20/2016 1159   O2SAT 98.8 10/31/2016 1045    CBG (last 3)  Recent Labs    11/15/17 0747 11/15/17 1311 11/15/17 1652  GLUCAP 313* 260* 282*    Assessment/Plan: S/P Procedure(s) (LRB): LEFT HEART CATH AND CORONARY ANGIOGRAPHY (N/A) Severe diffuse diabetic disease Recommend medical therapy   LOS: 1 day    Tharon Aquas Trigt III 11/15/2017

## 2017-11-15 NOTE — Interval H&P Note (Signed)
Cath Lab Visit (complete for each Cath Lab visit)  Clinical Evaluation Leading to the Procedure:   ACS: Yes.    Non-ACS:    Anginal Classification: CCS IV  Anti-ischemic medical therapy: Minimal Therapy (1 class of medications)  Non-Invasive Test Results: No non-invasive testing performed  Prior CABG: No previous CABG      History and Physical Interval Note:  11/15/2017 3:33 PM  Julian Ross  has presented today for surgery, with the diagnosis of Chest Pain  The various methods of treatment have been discussed with the patient and family. After consideration of risks, benefits and other options for treatment, the patient has consented to  Procedure(s): LEFT HEART CATH AND CORONARY ANGIOGRAPHY (N/A) as a surgical intervention .  The patient's history has been reviewed, patient examined, no change in status, stable for surgery.  I have reviewed the patient's chart and labs.  Questions were answered to the patient's satisfaction.     Larae Grooms

## 2017-11-15 NOTE — Progress Notes (Signed)
TR BAND REMOVAL  LOCATION:    right radial  DEFLATED PER PROTOCOL:    Yes.    TIME BAND OFF / DRESSING APPLIED:    1915   SITE UPON ARRIVAL:    Level 0  SITE AFTER BAND REMOVAL:    Level 0  CIRCULATION SENSATION AND MOVEMENT:    Within Normal Limits   Yes.    COMMENTS:   Tolerated procedure well

## 2017-11-15 NOTE — Progress Notes (Signed)
Responded to consult for completion of Advanced Directives.  Completed with witnesses.  Copy placed on chart.  Copy placed in Medical Records.  Original given to patient/patient's spouse.    11/15/17 1302  Clinical Encounter Type  Visited With Patient;Family  Visit Type Follow-up  Spiritual Encounters  Spiritual Needs Literature  Advance Directives (For Healthcare)  Does Patient Have a Medical Advance Directive? Yes

## 2017-11-15 NOTE — Progress Notes (Signed)
Progress Note  Patient Name: Julian Ross Date of Encounter: 11/15/2017  Primary Cardiologist: [name]  Subjective   No chest pain   Inpatient Medications    Scheduled Meds: . aspirin EC  81 mg Oral Daily  . carvedilol  12.5 mg Oral BID WC  . Chlorhexidine Gluconate Cloth  6 each Topical Q0600  . diphenhydrAMINE  50 mg Oral NOW  . Evolocumab  140 mg Subcutaneous Q14 Days  . ezetimibe  10 mg Oral Daily  . fenofibrate  160 mg Oral Daily  . folic acid  2 mg Oral Daily  . furosemide  100 mg Oral BID  . gabapentin  300 mg Oral QHS  . Influenza vac split quadrivalent PF  0.5 mL Intramuscular Tomorrow-1000  . insulin aspart  0-20 Units Subcutaneous Q4H  . insulin glargine  20 Units Subcutaneous QHS  . isosorbide mononitrate  15 mg Oral BID  . latanoprost  1 drop Both Eyes QHS  . magnesium oxide  400 mg Oral BID  . methylPREDNISolone (SOLU-MEDROL) injection  60 mg Intravenous NOW  . methylPREDNISolone (SOLU-MEDROL) injection  60 mg Intravenous Once  . mupirocin ointment  1 application Nasal BID  . omega-3 acid ethyl esters  1 capsule Oral BID  . PARoxetine  40 mg Oral Daily  . potassium chloride SA  40 mEq Oral BID  . predniSONE  5 mg Oral Q breakfast  . ranolazine  500 mg Oral BID  . sodium chloride flush  3 mL Intravenous Q12H  . sulfaSALAzine  500 mg Oral Daily  . tamsulosin  0.4 mg Oral Daily  . topiramate  25 mg Oral BID   Continuous Infusions: . sodium chloride    . sodium chloride 1 mL/kg/hr (11/15/17 0802)  . heparin 1,150 Units/hr (11/15/17 0801)  . potassium chloride     PRN Meds: sodium chloride, acetaminophen, ALPRAZolam, diphenhydrAMINE, gi cocktail, hydrALAZINE, morphine injection, ondansetron (ZOFRAN) IV, sodium chloride flush, tiZANidine, traMADol   Vital Signs    Vitals:   11/14/17 2359 11/15/17 0000 11/15/17 0300 11/15/17 0748  BP:  (!) 131/91  (!) 146/94  Pulse:      Resp:  20  19  Temp: 98.4 F (36.9 C)  98.6 F (37 C) 97.7 F (36.5 C)    TempSrc: Oral  Oral Oral  SpO2:  100%  98%  Weight:      Height:        Intake/Output Summary (Last 24 hours) at 11/15/2017 0858 Last data filed at 11/15/2017 0645 Gross per 24 hour  Intake 428.31 ml  Output 1575 ml  Net -1146.69 ml   Filed Weights   11/14/17 0600  Weight: 213 lb 13.5 oz (97 kg)    Telemetry    NSR 11/15/2017  - Personally Reviewed  ECG    NSR no acute ST changes  - Personally Reviewed  Physical Exam   GEN: No acute distress.   Neck: No JVD Cardiac: RRR, no murmurs, rubs, or gallops.  Respiratory: Clear to auscultation bilaterally. GI: Soft, nontender, non-distended  MS: No edema; No deformity. Neuro:  Nonfocal  Psych: Normal affect   Labs    Chemistry Recent Labs  Lab 11/13/17 1635 11/14/17 1301 11/15/17 0437  NA 130* 135 134*  K 2.8* 3.0* 3.0*  CL 83* 91* 89*  CO2 34* 33* 34*  GLUCOSE 167* 174* 290*  BUN 40* 35* 41*  CREATININE 2.16* 1.99* 2.38*  CALCIUM 9.9 9.7 9.9  GFRNONAA 29* 32* 26*  GFRAA 34* 37*  30*  ANIONGAP 13 11 11      Hematology Recent Labs  Lab 11/13/17 1635 11/14/17 0112 11/15/17 0437  WBC 12.6* 10.9* 10.3  RBC 4.32 4.26 4.14*  HGB 13.9 13.4 13.1  HCT 40.3 39.6 39.1  MCV 93.3 93.0 94.4  MCH 32.2 31.5 31.6  MCHC 34.5 33.8 33.5  RDW 13.0 13.0 13.0  PLT 326 323 307    Cardiac Enzymes Recent Labs  Lab 11/14/17 0112 11/14/17 0617 11/14/17 1301  TROPONINI 0.03* <0.03 <0.03    Recent Labs  Lab 11/13/17 1651  TROPIPOC 0.03     BNPNo results for input(s): BNP, PROBNP in the last 168 hours.   DDimer No results for input(s): DDIMER in the last 168 hours.   Radiology    Dg Chest 2 View  Result Date: 11/13/2017 CLINICAL DATA:  Left-sided chest pain. EXAM: CHEST  2 VIEW COMPARISON:  08/30/2017 FINDINGS: There is moderate cardiac enlargement. No pleural effusion. Decreased lung volumes with asymmetric elevation of right hemidiaphragm. Chronic reticular interstitial opacities are identified  left-greater-than-right and are favored to represent sequelae of chronic interstitial lung disease. IMPRESSION: 1. Cardiac enlargement without evidence for heart failure. 2. Suspect chronic interstitial lung disease. No superimposed pulmonary opacities identified. Electronically Signed   By: Kerby Moors M.D.   On: 11/13/2017 16:36    Cardiac Studies   None  Patient Profile     Julian Ross is a 71 y.o. 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 [name] day of admission during which time he had chest pain, decreased consciousness, and diaphoresis relieved by nitro - he was sent to ED for evaluation.     Assessment & Plan    Chest Pain:   Angina on good medical rx which includes ranexa, beta blocker and nitrates Reviewed cath films from last year and faint collaterals to RCA not clear that PDA would be bypassable and has distal LAD lesion with apical LAD supplying Collaterals to RCA. LIMA may not help as it would be anastomosed to mid LAD likely. No acute ECG changes stable. LA insulin  And Eliquis held  Increase coreg Premedicated for dye allerby cath with Dr Irish Lack. Last cath by Dr Tamala Julian from right radial and he has a great pulse. He is willing to have CABG at [facility] if  Needed previously wife indicated considering [facility]  PAF:  CHA2VASC 5 failed [place] resume eliquis post cath if not Needing CABG. Consider DAT a month then just plavix and eliquis post if stented  For questions or updates, please contact [facility] Please consult [email or web address] for contact info under Cardiology/STEMI.      Signed, Jenkins Rouge, MD  11/15/2017, 8:58 AM

## 2017-11-15 NOTE — H&P (View-Only) (Signed)
Progress Note  Patient Name: Julian Ross Date of Encounter: 11/15/2017  Primary Cardiologist: [name]  Subjective   No chest pain   Inpatient Medications    Scheduled Meds: . aspirin EC  81 mg Oral Daily  . carvedilol  12.5 mg Oral BID WC  . Chlorhexidine Gluconate Cloth  6 each Topical Q0600  . diphenhydrAMINE  50 mg Oral NOW  . Evolocumab  140 mg Subcutaneous Q14 Days  . ezetimibe  10 mg Oral Daily  . fenofibrate  160 mg Oral Daily  . folic acid  2 mg Oral Daily  . furosemide  100 mg Oral BID  . gabapentin  300 mg Oral QHS  . Influenza vac split quadrivalent PF  0.5 mL Intramuscular Tomorrow-1000  . insulin aspart  0-20 Units Subcutaneous Q4H  . insulin glargine  20 Units Subcutaneous QHS  . isosorbide mononitrate  15 mg Oral BID  . latanoprost  1 drop Both Eyes QHS  . magnesium oxide  400 mg Oral BID  . methylPREDNISolone (SOLU-MEDROL) injection  60 mg Intravenous NOW  . methylPREDNISolone (SOLU-MEDROL) injection  60 mg Intravenous Once  . mupirocin ointment  1 application Nasal BID  . omega-3 acid ethyl esters  1 capsule Oral BID  . PARoxetine  40 mg Oral Daily  . potassium chloride SA  40 mEq Oral BID  . predniSONE  5 mg Oral Q breakfast  . ranolazine  500 mg Oral BID  . sodium chloride flush  3 mL Intravenous Q12H  . sulfaSALAzine  500 mg Oral Daily  . tamsulosin  0.4 mg Oral Daily  . topiramate  25 mg Oral BID   Continuous Infusions: . sodium chloride    . sodium chloride 1 mL/kg/hr (11/15/17 0802)  . heparin 1,150 Units/hr (11/15/17 0801)  . potassium chloride     PRN Meds: sodium chloride, acetaminophen, ALPRAZolam, diphenhydrAMINE, gi cocktail, hydrALAZINE, morphine injection, ondansetron (ZOFRAN) IV, sodium chloride flush, tiZANidine, traMADol   Vital Signs    Vitals:   11/14/17 2359 11/15/17 0000 11/15/17 0300 11/15/17 0748  BP:  (!) 131/91  (!) 146/94  Pulse:      Resp:  20  19  Temp: 98.4 F (36.9 C)  98.6 F (37 C) 97.7 F (36.5 C)    TempSrc: Oral  Oral Oral  SpO2:  100%  98%  Weight:      Height:        Intake/Output Summary (Last 24 hours) at 11/15/2017 0858 Last data filed at 11/15/2017 0645 Gross per 24 hour  Intake 428.31 ml  Output 1575 ml  Net -1146.69 ml   Filed Weights   11/14/17 0600  Weight: 213 lb 13.5 oz (97 kg)    Telemetry    NSR 11/15/2017  - Personally Reviewed  ECG    NSR no acute ST changes  - Personally Reviewed  Physical Exam   GEN: No acute distress.   Neck: No JVD Cardiac: RRR, no murmurs, rubs, or gallops.  Respiratory: Clear to auscultation bilaterally. GI: Soft, nontender, non-distended  MS: No edema; No deformity. Neuro:  Nonfocal  Psych: Normal affect   Labs    Chemistry Recent Labs  Lab 11/13/17 1635 11/14/17 1301 11/15/17 0437  NA 130* 135 134*  K 2.8* 3.0* 3.0*  CL 83* 91* 89*  CO2 34* 33* 34*  GLUCOSE 167* 174* 290*  BUN 40* 35* 41*  CREATININE 2.16* 1.99* 2.38*  CALCIUM 9.9 9.7 9.9  GFRNONAA 29* 32* 26*  GFRAA 34* 37*  30*  ANIONGAP 13 11 11      Hematology Recent Labs  Lab 11/13/17 1635 11/14/17 0112 11/15/17 0437  WBC 12.6* 10.9* 10.3  RBC 4.32 4.26 4.14*  HGB 13.9 13.4 13.1  HCT 40.3 39.6 39.1  MCV 93.3 93.0 94.4  MCH 32.2 31.5 31.6  MCHC 34.5 33.8 33.5  RDW 13.0 13.0 13.0  PLT 326 323 307    Cardiac Enzymes Recent Labs  Lab 11/14/17 0112 11/14/17 0617 11/14/17 1301  TROPONINI 0.03* <0.03 <0.03    Recent Labs  Lab 11/13/17 1651  TROPIPOC 0.03     BNPNo results for input(s): BNP, PROBNP in the last 168 hours.   DDimer No results for input(s): DDIMER in the last 168 hours.   Radiology    Dg Chest 2 View  Result Date: 11/13/2017 CLINICAL DATA:  Left-sided chest pain. EXAM: CHEST  2 VIEW COMPARISON:  08/30/2017 FINDINGS: There is moderate cardiac enlargement. No pleural effusion. Decreased lung volumes with asymmetric elevation of right hemidiaphragm. Chronic reticular interstitial opacities are identified  left-greater-than-right and are favored to represent sequelae of chronic interstitial lung disease. IMPRESSION: 1. Cardiac enlargement without evidence for heart failure. 2. Suspect chronic interstitial lung disease. No superimposed pulmonary opacities identified. Electronically Signed   By: Kerby Moors M.D.   On: 11/13/2017 16:36    Cardiac Studies   None  Patient Profile     Julian Ross is a 71 y.o. 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 [name] day of admission during which time he had chest pain, decreased consciousness, and diaphoresis relieved by nitro - he was sent to ED for evaluation.     Assessment & Plan    Chest Pain:   Angina on good medical rx which includes ranexa, beta blocker and nitrates Reviewed cath films from last year and faint collaterals to RCA not clear that PDA would be bypassable and has distal LAD lesion with apical LAD supplying Collaterals to RCA. LIMA may not help as it would be anastomosed to mid LAD likely. No acute ECG changes stable. LA insulin  And Eliquis held  Increase coreg Premedicated for dye allerby cath with Dr Irish Lack. Last cath by Dr Tamala Julian from right radial and he has a great pulse. He is willing to have CABG at [facility] if  Needed previously wife indicated considering [facility]  PAF:  CHA2VASC 5 failed [place] resume eliquis post cath if not Needing CABG. Consider DAT a month then just plavix and eliquis post if stented  For questions or updates, please contact [facility] Please consult [email or web address] for contact info under Cardiology/STEMI.      Signed, Jenkins Rouge, MD  11/15/2017, 8:58 AM

## 2017-11-16 LAB — GLUCOSE, CAPILLARY
GLUCOSE-CAPILLARY: 301 mg/dL — AB (ref 65–99)
Glucose-Capillary: 344 mg/dL — ABNORMAL HIGH (ref 65–99)
Glucose-Capillary: 233 mg/dL — ABNORMAL HIGH (ref 65–99)

## 2017-11-16 LAB — BASIC METABOLIC PANEL
Anion gap: 11 (ref 5–15)
BUN: 43 mg/dL — AB (ref 6–20)
CO2: 28 mmol/L (ref 22–32)
Calcium: 9.3 mg/dL (ref 8.9–10.3)
Chloride: 93 mmol/L — ABNORMAL LOW (ref 101–111)
Creatinine, Ser: 2.44 mg/dL — ABNORMAL HIGH (ref 0.61–1.24)
GFR calc Af Amer: 29 mL/min — ABNORMAL LOW (ref >60)
GFR, EST NON AFRICAN AMERICAN: 25 mL/min — AB (ref >60)
GLUCOSE: 311 mg/dL — AB (ref 65–99)
POTASSIUM: 3.5 mmol/L (ref 3.5–5.1)
Sodium: 132 mmol/L — ABNORMAL LOW (ref 135–145)

## 2017-11-16 LAB — APTT: aPTT: 29 seconds (ref 24–36)

## 2017-11-16 LAB — CBC
HCT: 37.1 % — ABNORMAL LOW (ref 39.0–52.0)
Hemoglobin: 12.5 g/dL — ABNORMAL LOW (ref 13.0–17.0)
MCH: 31.3 pg (ref 26.0–34.0)
MCHC: 33.7 g/dL (ref 30.0–36.0)
MCV: 92.8 fL (ref 78.0–100.0)
PLATELETS: 254 10*3/uL (ref 150–400)
RBC: 4 MIL/uL — AB (ref 4.22–5.81)
RDW: 12.7 % (ref 11.5–15.5)
WBC: 18 10*3/uL — ABNORMAL HIGH (ref 4.0–10.5)

## 2017-11-16 MED ORDER — APIXABAN 5 MG PO TABS: 5.0000 mg | ORAL_TABLET | Freq: Two times a day (BID) | ORAL | Status: DC

## 2017-11-16 MED ORDER — APIXABAN 5 MG PO TABS
5.0000 mg | ORAL_TABLET | Freq: Two times a day (BID) | ORAL | Status: DC
  Administered 2017-11-16 – 2017-11-17 (×3): 5 mg via ORAL
  Filled 2017-11-16 (×3): qty 1

## 2017-11-16 MED ORDER — INSULIN ASPART 100 UNIT/ML ~~LOC~~ SOLN
0.0000 [IU] | Freq: Three times a day (TID) | SUBCUTANEOUS | Status: DC
  Administered 2017-11-16: 20 [IU] via SUBCUTANEOUS
  Administered 2017-11-17: 7 [IU] via SUBCUTANEOUS

## 2017-11-16 MED ORDER — INSULIN ASPART 100 UNIT/ML ~~LOC~~ SOLN
0.0000 [IU] | Freq: Every day | SUBCUTANEOUS | Status: DC
  Administered 2017-11-16: 4 [IU] via SUBCUTANEOUS

## 2017-11-16 MED ORDER — ISOSORBIDE MONONITRATE ER 60 MG PO TB24
60.0000 mg | ORAL_TABLET | Freq: Every day | ORAL | Status: DC
  Administered 2017-11-16 – 2017-11-17 (×2): 60 mg via ORAL
  Filled 2017-11-16 (×2): qty 1

## 2017-11-16 MED ORDER — APIXABAN 2.5 MG PO TABS: 2.5000 mg | ORAL_TABLET | Freq: Two times a day (BID) | ORAL | Status: DC

## 2017-11-16 NOTE — Progress Notes (Signed)
Progress Note    Julian Ross  XTG:[id number] DOB: 10-14-1946  DOA: 11/13/2017 PCP: Reynold Bowen, MD    Brief Narrative:   Chief complaint: Follow-up chest pain  Medical records reviewed and are as summarized below:  Julian Ross is an 71 y.o. male with a PMH of RA, severe diffuse three-vessel CAD with stents 89, A. fib on", OSA on CPAP, hypertension, hyperlipidemia, diabetes, stage III CK D, and chronic systolic CHF with EF 27-03 percent echo on 11/04/17 was admitted 11/13/17 with chief complaint of chest pain which began in his cardiologist's office. EKG done at that time showed A. fib with prior inferior infarct. He was given a dose of nitroglycerin which caused a vagal response.  Assessment/Plan:   Principal Problem:   Chest pain in a patient with diffuse severe CAD Evaluation on admission showed a unremarkable chest x-ray, negative troponin, no ischemic changes on EKG. Continue Imdur, Ranexa, ASA, Repatha. Status post cardiac catheterization showing severe 3 vessel disease with total occlusion of the RCA, diffuse disease of the LAD to the apex and OM branches of the circumflex stop evaluated by CVTS and not felt to be a good candidate for bypass due to poor targets. Medical therapy recommended.  Active Problems:   MRSA carrier Decontamination therapy ordered.    Hyponatremia Likely from CHF physiology. Mild, stable.    Hypokalemia Potassium 3.5, continue supplementation.    Paroxysmal atrial fibrillation (HCC)/chronic anticoagulation CHA2DS2-VASc score is 5, 7.2% stroke rate per year. History of failed cardioversion. Continue IV heparin in anticipation of cardiac catheterization. Continue Coreg with increased dose noted per cardiology.    Chronic combined systolic and diastolic CHF (congestive heart failure) (HCC) Continue Lasix and Coreg (dose increased). Hold metolazone.    CKD (chronic kidney disease) stage 3, GFR 30-59 ml/min (HCC) Baseline creatinine appears to  be around 2.39 as of October, but was 1.69 back in March. Current creatinine 2.44. Monitor closely given diuretic therapy and dye load from cardiac catheterization.    Benign essential HTN Continue current antihypertensives.  Coreg dose increased 11/14/17.    Dyslipidemia associated with type 2 diabetes mellitus (HCC) Continue Repatha, Tricor, Lovaza, Zetia. Reported allergy to statins. Lipid panel as noted below, LDL 18. Will be referred to lipid clinic for PCSK9 therapy per cardiology notes.    Syncope Triggered by nitroglycerin and likely a vagal response. No recurrent symptoms.    OSA on CPAP Continue CPAP at night.    Diabetes mellitus type 2, insulin dependent (HCC) Body mass index is 29.92 kg/m. Currently being managed with moderate scale SSI. CBGs 192-328. Add Lantus and change SSI due to resistant scale. Hemoglobin A1c is 7.9%.   Family Communication/Anticipated D/C date and plan/Code Status   DVT prophylaxis: Heparin ordered. Code Status: DO NOT RESUSCITATE.  Family Communication: Family updated at the bedside. Disposition Plan: Home once plan of care fully defined.   Medical Consultants:    Cardiology   Anti-Infectives:    None  Subjective:   Continues to deny dizziness, chest pain, dyspnea.   Objective:    Vitals:   11/16/17 0328 11/16/17 0413 11/16/17 0600 11/16/17 0753  BP: (!) 150/67  136/80 (!) 155/88  Pulse: 82  74 80  Resp: 19  14 19   Temp: (!) 97.3 F (36.3 C)   98 F (36.7 C)  TempSrc: Oral   Oral  SpO2: 97%   98%  Weight: 88.6 kg (195 lb 5.2 oz) 97.3 kg (214 lb 8.1 oz)  Height:        Intake/Output Summary (Last 24 hours) at 11/16/2017 0830 Last data filed at 11/16/2017 0155 Gross per 24 hour  Intake 732.66 ml  Output 800 ml  Net -67.34 ml   Filed Weights   11/15/17 1636 11/16/17 0328 11/16/17 0413  Weight: 97 kg (213 lb 13.5 oz) 88.6 kg (195 lb 5.2 oz) 97.3 kg (214 lb 8.1 oz)    Exam: (Unchanged from 11/15/17) General: No  acute distress. Cardiovascular: Heart sounds are irregular. No gallops or rubs. No murmurs. No JVD. Lungs: Clear to auscultation bilaterally with good air movement. No rales, rhonchi or wheezes. Abdomen: Soft, nontender, nondistended with normal active bowel sounds. No masses. No hepatosplenomegaly. Neurological: Alert and oriented 3. Moves all extremities 4 with equal strength. Cranial nerves II through XII grossly intact. Skin: Warm and dry. No rashes or lesions. Extremities: No clubbing or cyanosis. No edema. Pedal pulses 2+. Psychiatric: Mood and affect are normal. Insight and judgment are good.   Data Reviewed:   I have personally reviewed following labs and imaging studies:  Labs: Labs show the following:   Basic Metabolic Panel: Recent Labs  Lab 11/13/17 1635 11/14/17 0617 11/14/17 1301 11/15/17 0437 11/16/17 0307  NA 130*  --  135 134* 132*  K 2.8*  --  3.0* 3.0* 3.5  CL 83*  --  91* 89* 93*  CO2 34*  --  33* 34* 28  GLUCOSE 167*  --  174* 290* 311*  BUN 40*  --  35* 41* 43*  CREATININE 2.16*  --  1.99* 2.38* 2.44*  CALCIUM 9.9  --  9.7 9.9 9.3  MG  --  2.4  --   --   --    GFR Estimated Creatinine Clearance: 33 mL/min (A) (by C-G formula based on SCr of 2.44 mg/dL (H)).  CBC: Recent Labs  Lab 11/13/17 1635 11/14/17 0112 11/15/17 0437 11/16/17 0307  WBC 12.6* 10.9* 10.3 18.0*  HGB 13.9 13.4 13.1 12.5*  HCT 40.3 39.6 39.1 37.1*  MCV 93.3 93.0 94.4 92.8  PLT 326 323 307 254   Cardiac Enzymes: Recent Labs  Lab 11/14/17 0112 11/14/17 0617 11/14/17 1301  TROPONINI 0.03* <0.03 <0.03   CBG: Recent Labs  Lab 11/15/17 1311 11/15/17 1652 11/15/17 2221 11/16/17 0150 11/16/17 0619  GLUCAP 260* 282* 367* 344* 233*   Hgb A1c: Recent Labs    11/14/17 0112  HGBA1C 7.9*   Lipid Profile: Recent Labs    11/14/17 0112 11/14/17 0617  CHOL 102 101  HDL 45 43  LDLCALC 26 18  TRIG 155* 201*  CHOLHDL 2.3 2.3    Microbiology Recent Results (from  the past 240 hour(s))  MRSA PCR Screening     Status: Abnormal   Collection Time: 11/14/17  6:41 AM  Result Value Ref Range Status   MRSA by PCR POSITIVE (A) NEGATIVE Final    Comment:        The GeneXpert MRSA Assay (FDA approved for NASAL specimens only), is one component of a comprehensive MRSA colonization surveillance program. It is not intended to diagnose MRSA infection nor to guide or monitor treatment for MRSA infections. RESULT CALLED TO, READ BACK BY AND VERIFIED WITH: D. Muhoro RN 9:50 11/14/17 ([email or web address])     Procedures and diagnostic studies:  11/13/17: Dg Chest 2 View: My independent review of the image shows: Cardiomegaly. No infiltrates. No effusions. No evidence of acute CHF.    Medications:   . aspirin EC  81  mg Oral Daily  . carvedilol  12.5 mg Oral BID WC  . Chlorhexidine Gluconate Cloth  6 each Topical Q0600  . Evolocumab  140 mg Subcutaneous Q14 Days  . ezetimibe  10 mg Oral Daily  . fenofibrate  160 mg Oral Daily  . folic acid  2 mg Oral Daily  . furosemide  100 mg Oral BID  . gabapentin  300 mg Oral QHS  . heparin  5,000 Units Subcutaneous Q8H  . Influenza vac split quadrivalent PF  0.5 mL Intramuscular Tomorrow-1000  . insulin aspart  0-20 Units Subcutaneous Q4H  . insulin glargine  20 Units Subcutaneous QHS  . isosorbide mononitrate  15 mg Oral BID  . latanoprost  1 drop Both Eyes QHS  . magnesium oxide  400 mg Oral BID  . mupirocin ointment  1 application Nasal BID  . omega-3 acid ethyl esters  1 capsule Oral BID  . PARoxetine  40 mg Oral Daily  . potassium chloride SA  40 mEq Oral TID  . predniSONE  5 mg Oral Q breakfast  . ranolazine  500 mg Oral BID  . sodium chloride flush  3 mL Intravenous Q12H  . sulfaSALAzine  500 mg Oral Daily  . tamsulosin  0.4 mg Oral Daily  . topiramate  25 mg Oral BID   Continuous Infusions: . sodium chloride       LOS: 2 days   Jacquelynn Cree  [facility] Pager [phone]. If unable to  reach me by pager, please call my cell phone at [phone].  *Please refer to [email or web address], password [email or web address] to get updated schedule on who will round on this patient, as hospitalists switch teams weekly. If 7PM-7AM, please contact night-coverage at [email or web address], password [email or web address] for any overnight needs.  11/16/2017, 8:30 AM

## 2017-11-16 NOTE — Progress Notes (Signed)
Received patient in room 4E09  From unit [place] and oriented, placed on [place] notified.  Heart rhythm afib, O2 sat 97% on room air, denies any pain at this time.  Mervyn Skeeters, RN

## 2017-11-16 NOTE — Progress Notes (Signed)
Progress Note  Patient Name: Julian Ross Date of Encounter: 11/16/2017  Primary Cardiologist: Julian Ross  Subjective   No complaints today. He denies CP or dyspnea. Asymptomatic with his afib. Right radial cath site is stable.   Inpatient Medications    Scheduled Meds: . aspirin EC  81 mg Oral Daily  . carvedilol  12.5 mg Oral BID WC  . Chlorhexidine Gluconate Cloth  6 each Topical Q0600  . Evolocumab  140 mg Subcutaneous Q14 Days  . ezetimibe  10 mg Oral Daily  . fenofibrate  160 mg Oral Daily  . folic acid  2 mg Oral Daily  . furosemide  100 mg Oral BID  . gabapentin  300 mg Oral QHS  . heparin  5,000 Units Subcutaneous Q8H  . Influenza vac split quadrivalent PF  0.5 mL Intramuscular Tomorrow-1000  . insulin aspart  0-20 Units Subcutaneous Q4H  . insulin glargine  20 Units Subcutaneous QHS  . isosorbide mononitrate  15 mg Oral BID  . latanoprost  1 drop Both Eyes QHS  . magnesium oxide  400 mg Oral BID  . mupirocin ointment  1 application Nasal BID  . omega-3 acid ethyl esters  1 capsule Oral BID  . PARoxetine  40 mg Oral Daily  . potassium chloride SA  40 mEq Oral TID  . predniSONE  5 mg Oral Q breakfast  . ranolazine  500 mg Oral BID  . sodium chloride flush  3 mL Intravenous Q12H  . sulfaSALAzine  500 mg Oral Daily  . tamsulosin  0.4 mg Oral Daily  . topiramate  25 mg Oral BID   Continuous Infusions: . sodium chloride     PRN Meds: sodium chloride, acetaminophen, ALPRAZolam, diphenhydrAMINE, gi cocktail, hydrALAZINE, morphine injection, ondansetron (ZOFRAN) IV, sodium chloride flush, tiZANidine, traMADol   Vital Signs    Vitals:   11/16/17 0328 11/16/17 0413 11/16/17 0600 11/16/17 0753  BP: (!) 150/67  136/80 (!) 155/88  Pulse: 82  74 80  Resp: 19  14 19   Temp: (!) 97.3 F (36.3 C)   98 F (36.7 C)  TempSrc: Oral   Oral  SpO2: 97%   98%  Weight: 195 lb 5.2 oz (88.6 kg) 214 lb 8.1 oz (97.3 kg)    Height:        Intake/Output Summary (Last 24  hours) at 11/16/2017 0808 Last data filed at 11/16/2017 0155 Gross per 24 hour  Intake 732.66 ml  Output 800 ml  Net -67.34 ml   Filed Weights   11/15/17 1636 11/16/17 0328 11/16/17 0413  Weight: 213 lb 13.5 oz (97 kg) 195 lb 5.2 oz (88.6 kg) 214 lb 8.1 oz (97.3 kg)    Telemetry    Atrial fibrillation w/ CVR - Personally Reviewed  ECG    afib  - Personally Reviewed  Physical Exam   GEN: No acute distress.   Neck: No JVD Cardiac: irregularlly irregular, no murmurs, rubs, or gallops.  Respiratory: Clear to auscultation bilaterally. GI: Soft, nontender, non-distended  MS: No edema; No deformity. Neuro:  Nonfocal  Psych: Normal affect   Labs    Chemistry Recent Labs  Lab 11/14/17 1301 11/15/17 0437 11/16/17 0307  NA 135 134* 132*  K 3.0* 3.0* 3.5  CL 91* 89* 93*  CO2 33* 34* 28  GLUCOSE 174* 290* 311*  BUN 35* 41* 43*  CREATININE 1.99* 2.38* 2.44*  CALCIUM 9.7 9.9 9.3  GFRNONAA 32* 26* 25*  GFRAA 37* 30* 29*  ANIONGAP 11  11 11     Hematology Recent Labs  Lab 11/14/17 0112 11/15/17 0437 11/16/17 0307  WBC 10.9* 10.3 18.0*  RBC 4.26 4.14* 4.00*  HGB 13.4 13.1 12.5*  HCT 39.6 39.1 37.1*  MCV 93.0 94.4 92.8  MCH 31.5 31.6 31.3  MCHC 33.8 33.5 33.7  RDW 13.0 13.0 12.7  PLT 323 307 254    Cardiac Enzymes Recent Labs  Lab 11/14/17 0112 11/14/17 0617 11/14/17 1301  TROPONINI 0.03* <0.03 <0.03    Recent Labs  Lab 11/13/17 1651  TROPIPOC 0.03     BNPNo results for input(s): BNP, PROBNP in the last 168 hours.   DDimer No results for input(s): DDIMER in the last 168 hours.   Radiology    No results found.  Cardiac Studies   [facility] 11/15/17 Procedures   LEFT HEART CATH AND CORONARY ANGIOGRAPHY  Conclusion     Prox RCA to Mid RCA lesion is 100% stenosed.  Mid LAD lesion is 75% stenosed.  Mid LAD to Dist LAD lesion is 80% stenosed.  Prox LAD lesion is 70% stenosed.  Post Atrio lesion is 100% stenosed.  Prox Cx to Mid Cx lesion  is 25% stenosed.  Mid Cx lesion is 75% stenosed.  Ost 2nd Mrg lesion is 75% stenosed.  Dist LAD lesion is 80% stenosed.  LV end diastolic pressure is normal.  There is no aortic valve stenosis.   Severe three vessel disease.  No acute change from prior cath in 2017.  Per the rounding note, patient is now willing to consider CABG.  Will plan for cardiac surgery consult.      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  Assessment & Plan    1. CAD: severe 3V diabetic disease on cath w/ total occlusion of the RCA, diffuse disease of the LAD to the apex and OM branches of the circumflex. He has been seen by CT surgery. Julian Ross notes he has poor targets for bypass. Not a good surgical candidate. Medical management recommended. He is chest pain free. Continue treatment with ASA, BB, Imdur and Ranxea. He is also not on statin therapy due to intolerance. Recommend referral to our lipid clinic for PCSK9 therapy.   2. DM: poorly controlled. He is on Insulin. Management per IM.   3. HTN: moderately elevated. Given his CAD to be treated by medical management, recommend further titration of antianginals>> Imdur and Coreg.   4. AFib: rate is controlled w/ BB. He was on Eliquis prior to cath, however SCr is up to 2.4. Will need to address continuation of DOAC vs change to coumadin.   For questions or updates, please contact Julian Ross Please consult [email or web address] for contact info under Cardiology/STEMI.      Signed, Julian Jester, PA-C  11/16/2017, 8:08 AM

## 2017-11-17 LAB — CBC
HCT: 35.9 % — ABNORMAL LOW (ref 39.0–52.0)
HEMOGLOBIN: 12 g/dL — AB (ref 13.0–17.0)
MCH: 31.3 pg (ref 26.0–34.0)
MCHC: 33.4 g/dL (ref 30.0–36.0)
MCV: 93.7 fL (ref 78.0–100.0)
Platelets: 268 10*3/uL (ref 150–400)
RBC: 3.83 MIL/uL — AB (ref 4.22–5.81)
RDW: 13.1 % (ref 11.5–15.5)
WBC: 15.1 10*3/uL — ABNORMAL HIGH (ref 4.0–10.5)

## 2017-11-17 LAB — GLUCOSE, CAPILLARY
Glucose-Capillary: 365 mg/dL — ABNORMAL HIGH (ref 65–99)
Glucose-Capillary: 326 mg/dL — ABNORMAL HIGH (ref 65–99)
Glucose-Capillary: 248 mg/dL — ABNORMAL HIGH (ref 65–99)
Glucose-Capillary: 233 mg/dL — ABNORMAL HIGH (ref 65–99)
Glucose-Capillary: 358 mg/dL — ABNORMAL HIGH (ref 65–99)

## 2017-11-17 LAB — BASIC METABOLIC PANEL
ANION GAP: 8 (ref 5–15)
BUN: 41 mg/dL — ABNORMAL HIGH (ref 6–20)
CHLORIDE: 97 mmol/L — AB (ref 101–111)
CO2: 30 mmol/L (ref 22–32)
CREATININE: 2.29 mg/dL — AB (ref 0.61–1.24)
Calcium: 9.8 mg/dL (ref 8.9–10.3)
GFR calc non Af Amer: 27 mL/min — ABNORMAL LOW (ref >60)
GFR, EST AFRICAN AMERICAN: 31 mL/min — AB (ref >60)
Glucose, Bld: 243 mg/dL — ABNORMAL HIGH (ref 65–99)
POTASSIUM: 3.9 mmol/L (ref 3.5–5.1)
SODIUM: 135 mmol/L (ref 135–145)

## 2017-11-17 MED ORDER — CARVEDILOL 12.5 MG PO TABS: 12.5000 mg | ORAL_TABLET | Freq: Two times a day (BID) | ORAL | 2 refills | Status: DC

## 2017-11-17 MED ORDER — ASPIRIN 81 MG PO TBEC: 81.0000 mg | DELAYED_RELEASE_TABLET | Freq: Every day | ORAL | Status: AC

## 2017-11-17 MED ORDER — ISOSORBIDE MONONITRATE ER 60 MG PO TB24: 60.0000 mg | ORAL_TABLET | Freq: Every day | ORAL | 2 refills | Status: DC

## 2017-11-17 NOTE — Progress Notes (Signed)
Pt discharged to home. IV sites removed, tele removed. Discharge instruction reviewed with pt. Pt verbalized understanding and has no questions at this time. Pt will be taken home by wife via personal vehicle.   Cristal Howatt M

## 2017-11-17 NOTE — Progress Notes (Signed)
Progress Note  Patient Name: Julian Ross Date of Encounter: 11/17/2017  Primary Cardiologist: Dr. [name]  Subjective   Denies any chest pain or SOB  Inpatient Medications    Scheduled Meds: . apixaban  5 mg Oral BID  . aspirin EC  81 mg Oral Daily  . carvedilol  12.5 mg Oral BID WC  . Chlorhexidine Gluconate Cloth  6 each Topical Q0600  . Evolocumab  140 mg Subcutaneous Q14 Days  . ezetimibe  10 mg Oral Daily  . fenofibrate  160 mg Oral Daily  . folic acid  2 mg Oral Daily  . gabapentin  300 mg Oral QHS  . Influenza vac split quadrivalent PF  0.5 mL Intramuscular Tomorrow-1000  . insulin aspart  0-20 Units Subcutaneous TID WC  . insulin aspart  0-5 Units Subcutaneous QHS  . insulin glargine  20 Units Subcutaneous QHS  . isosorbide mononitrate  60 mg Oral Daily  . latanoprost  1 drop Both Eyes QHS  . magnesium oxide  400 mg Oral BID  . mupirocin ointment  1 application Nasal BID  . omega-3 acid ethyl esters  1 capsule Oral BID  . PARoxetine  40 mg Oral Daily  . potassium chloride SA  40 mEq Oral TID  . predniSONE  5 mg Oral Q breakfast  . ranolazine  500 mg Oral BID  . sodium chloride flush  3 mL Intravenous Q12H  . sulfaSALAzine  500 mg Oral Daily  . tamsulosin  0.4 mg Oral Daily  . topiramate  25 mg Oral BID   Continuous Infusions: . sodium chloride     PRN Meds: sodium chloride, acetaminophen, ALPRAZolam, diphenhydrAMINE, gi cocktail, hydrALAZINE, morphine injection, ondansetron (ZOFRAN) IV, sodium chloride flush, tiZANidine, traMADol   Vital Signs    Vitals:   11/16/17 1200 11/16/17 1534 11/16/17 1936 11/17/17 0410  BP: (!) 150/94 136/83 139/86 133/87  Pulse:   83 73  Resp: 13 18 (!) 28 14  Temp:  97.8 F (36.6 C) 97.8 F (36.6 C) 98.6 F (37 C)  TempSrc:   Oral Oral  SpO2:  97% 98% 98%  Weight:      Height:        Intake/Output Summary (Last 24 hours) at 11/17/2017 0836 Last data filed at 11/17/2017 0316 Gross per 24 hour  Intake 1080 ml    Output 5200 ml  Net -4120 ml   Filed Weights   11/15/17 1636 11/16/17 0328 11/16/17 0413  Weight: 213 lb 13.5 oz (97 kg) 195 lb 5.2 oz (88.6 kg) 214 lb 8.1 oz (97.3 kg)    Telemetry    NSR - Personally Reviewed  ECG    Atrial fibrillation with inferior infarct  - Personally Reviewed  Physical Exam   GEN: No acute distress.   Neck: No JVD Cardiac: RRR, no murmurs, rubs, or gallops.  Respiratory: Clear to auscultation bilaterally. GI: Soft, nontender, non-distended  MS: No edema; No deformity. Neuro:  Nonfocal  Psych: Normal affect   Labs    Chemistry Recent Labs  Lab 11/15/17 0437 11/16/17 0307 11/17/17 0250  NA 134* 132* 135  K 3.0* 3.5 3.9  CL 89* 93* 97*  CO2 34* 28 30  GLUCOSE 290* 311* 243*  BUN 41* 43* 41*  CREATININE 2.38* 2.44* 2.29*  CALCIUM 9.9 9.3 9.8  GFRNONAA 26* 25* 27*  GFRAA 30* 29* 31*  ANIONGAP 11 11 8      Hematology Recent Labs  Lab 11/15/17 6283 11/16/17 0307 11/17/17 0250  WBC 10.3 18.0* 15.1*  RBC 4.14* 4.00* 3.83*  HGB 13.1 12.5* 12.0*  HCT 39.1 37.1* 35.9*  MCV 94.4 92.8 93.7  MCH 31.6 31.3 31.3  MCHC 33.5 33.7 33.4  RDW 13.0 12.7 13.1  PLT 307 254 268    Cardiac Enzymes Recent Labs  Lab 11/14/17 0112 11/14/17 0617 11/14/17 1301  TROPONINI 0.03* <0.03 <0.03    Recent Labs  Lab 11/13/17 1651  TROPIPOC 0.03     BNPNo results for input(s): BNP, PROBNP in the last 168 hours.   DDimer No results for input(s): DDIMER in the last 168 hours.   Radiology    No results found.  Cardiac Studies   [facility] 11/15/17 Procedures   LEFT HEART CATH AND CORONARY ANGIOGRAPHY  Conclusion     Prox RCA to Mid RCA lesion is 100% stenosed.  Mid LAD lesion is 75% stenosed.  Mid LAD to Dist LAD lesion is 80% stenosed.  Prox LAD lesion is 70% stenosed.  Post Atrio lesion is 100% stenosed.  Prox Cx to Mid Cx lesion is 25% stenosed.  Mid Cx lesion is 75% stenosed.  Ost 2nd Mrg lesion is 75% stenosed.  Dist LAD  lesion is 80% stenosed.  LV end diastolic pressure is normal.  There is no aortic valve stenosis.  Severe three vessel disease. No acute change from prior cath in 2017. Per the rounding note, patient is now willing to consider CABG. Will plan for cardiac surgery consult.       Patient Profile     71 y.o. 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 [name] day of admission during which time he had chest pain, decreased consciousness, and diaphoresis relieved by nitro - he was sent to ED for evaluation.    Assessment & Plan    1. CAD: severe 3V diabetic disease on cath w/ total occlusion of the RCA, diffuse disease of the LAD to the apex and OM branches of the circumflex. ee - not a good surgical candidate due to poor distal targets per CVTS - medical management - continue ASA, Plavix, BB, Imdur and Ranxea.  - intolerant to statins so will need referral to lipid clinic for PSCK9 inhibitor therapy as outpt  2. DM: poorly controlled. He is on Insulin. - per TRH  3. HTN: BP well controlled.  - continue carvedilol and long acting nitrates.  4. AFib: rate is controlled w/ BB.  - continue Eliquis 5mg  BID - creatinine improved to 2.29  5.  CKD stage 3-4.   - creatinine improving and down from 2.44 to 2.29 today after holding Lasix - continue to hold Lasix again today as LVEDP was low at time of cath and he does not appear volume overloaded and reassess in am.    For questions or updates, please contact [place] Please consult [email or web address] for contact info under Cardiology/STEMI.      Signed, Fransico Him, MD  11/17/2017, 8:36 AM

## 2017-11-17 NOTE — Discharge Summary (Signed)
Physician Discharge Summary  Julian Ross [facility]:[id number] DOB: [birth year] DOA: 11/13/2017  PCP: Reynold Bowen, MD  Admit date: 11/13/2017 Discharge date: 11/17/2017  Admitted From: Home Discharge disposition: Home   Recommendations for Outpatient Follow-Up:   1. Recommend outpatient F/U to re-check BMET in 3-4 days.  Advised to hold diuretics for another 48 hours.   Discharge Diagnosis:   Principal Problem:   Chest pain/unstable angina Active Problems:   Paroxysmal atrial fibrillation (HCC)   Chronic combined systolic and diastolic CHF (congestive heart failure) (HCC)   CKD (chronic kidney disease) stage 3, GFR 30-59 ml/min (HCC)   Benign essential HTN   Dyslipidemia associated with type 2 diabetes mellitus (HCC)   Syncope   Hyponatremia   OSA on CPAP   Diabetes mellitus type 2, insulin dependent (HCC)   Chronic anticoagulation   Hypokalemia  Discharge Condition: Improved.  Diet recommendation: Low sodium, heart healthy.  Carbohydrate-modified.   History of Present Illness:   Julian Ross is an 71 y.o. 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 fib on", OSA on CPAP, hypertension, hyperlipidemia, diabetes, stage III CK D, and chronic systolic CHF with EF 66-29 percent echo on 11/04/17 was admitted 11/13/17 with chief complaint of chest pain which began in his cardiologist's office. EKG done at that time showed A. fib with prior inferior infarct. He was given a dose of nitroglycerin which caused a vagal response.  Hospital Course by Problem:   Principal Problem:   Chest pain//unstable angina in a patient with diffuse severe CAD Evaluation on admission showed a unremarkable chest x-ray, negative troponin, no ischemic changes on EKG. Continue Imdur, Ranexa, ASA, Repatha. Status post cardiac catheterization showing severe 3 vessel disease with total occlusion of the RCA, diffuse disease of the LAD to the apex and OM branches of the circumflex  stop evaluated by CVTS and not felt to be a good candidate for bypass due to poor targets. Medical therapy recommended. Anti-anginal therapy adjusted.  Active Problems:   MRSA carrier Decontamination therapy ordered.    Hyponatremia Likely from CHF physiology. Mild, stable.    Hypokalemia Potassium 3.5, continue supplementation.    Paroxysmal atrial fibrillation (HCC)/chronic anticoagulation CHA2DS2-VASc score is 5, 7.2% stroke rate per year. History of failed cardioversion. Resume Eliquis. Continue Coreg with increased dose noted per cardiology.    Chronic combined systolic and diastolic CHF (congestive heart failure) (HCC) Continue Coreg (dose increased). Hold metolazone & Lasix x 48 hours.    CKD (chronic kidney disease) stage 3, GFR 30-59 ml/min (HCC) Baseline creatinine appears to be around 2.39 as of October, but was 1.69 back in March. Current creatinine 2.29. Hold diuretics x 48 hours.    Benign essential HTN Continue current antihypertensives.  Coreg dose increased 11/14/17.    Dyslipidemia associated with type 2 diabetes mellitus (HCC) ContinueRepatha, Tricor, Lovaza, Zetia. Reported allergy to statins. Lipid panel as noted below, LDL 18. Will be referred tolipid clinic for PCSK9 therapy per cardiology notes.    Syncope Triggered by nitroglycerin and likely a vagal response. No recurrent symptoms.    OSA on CPAP Continue CPAP at night.    Diabetes mellitus type 2, insulin dependent (HCC) Body mass index is 29.92 kg/m. Resume outpatient regimen at discharge.   Medical Consultants:    Cardiology   Discharge Exam:   Vitals:   11/16/17 1936 11/17/17 0410  BP: 139/86 133/87  Pulse: 83 73  Resp: (!) 28 14  Temp: 97.8 F (36.6 C)  98.6 F (37 C)  SpO2: 98% 98%   Vitals:   11/16/17 1200 11/16/17 1534 11/16/17 1936 11/17/17 0410  BP: (!) 150/94 136/83 139/86 133/87  Pulse:   83 73  Resp: 13 18 (!) 28 14  Temp:  97.8 F (36.6 C) 97.8 F (36.6  C) 98.6 F (37 C)  TempSrc:   Oral Oral  SpO2:  97% 98% 98%  Weight:      Height:        General exam: Appears calm and comfortable.  Respiratory system: Clear to auscultation. Respiratory effort normal. Cardiovascular system: S1 & S2 heard, RRR. No JVD,  rubs, gallops or clicks. No murmurs. Gastrointestinal system: Abdomen is nondistended, soft and nontender. No organomegaly or masses felt. Normal bowel sounds heard. Central nervous system: Alert and oriented. No focal neurological deficits. Extremities: No clubbing,  or cyanosis. No edema. Skin: No rashes, lesions or ulcers. Psychiatry: Judgement and insight appear normal. Mood & affect appropriate.    The results of significant diagnostics from this hospitalization (including imaging, microbiology, ancillary and laboratory) are listed below for reference.     Procedures and Diagnostic Studies:   Dg Chest 2 View  Result Date: 11/13/2017 CLINICAL DATA:  Left-sided chest pain. EXAM: CHEST  2 VIEW COMPARISON:  08/30/2017 FINDINGS: There is moderate cardiac enlargement. No pleural effusion. Decreased lung volumes with asymmetric elevation of right hemidiaphragm. Chronic reticular interstitial opacities are identified left-greater-than-right and are favored to represent sequelae of chronic interstitial lung disease. IMPRESSION: 1. Cardiac enlargement without evidence for heart failure. 2. Suspect chronic interstitial lung disease. No superimposed pulmonary opacities identified. Electronically Signed   By: Kerby Moors M.D.   On: 11/13/2017 16:36     Labs:   Basic Metabolic Panel: Recent Labs  Lab 11/13/17 1635 11/14/17 0617 11/14/17 1301 11/15/17 0437 11/16/17 0307 11/17/17 0250  NA 130*  --  135 134* 132* 135  K 2.8*  --  3.0* 3.0* 3.5 3.9  CL 83*  --  91* 89* 93* 97*  CO2 34*  --  33* 34* 28 30  GLUCOSE 167*  --  174* 290* 311* 243*  BUN 40*  --  35* 41* 43* 41*  CREATININE 2.16*  --  1.99* 2.38* 2.44* 2.29*  CALCIUM  9.9  --  9.7 9.9 9.3 9.8  MG  --  2.4  --   --   --   --    GFR Estimated Creatinine Clearance: 35.2 mL/min (A) (by C-G formula based on SCr of 2.29 mg/dL (H)).  Coagulation profile Recent Labs  Lab 11/15/17 0437  INR 1.33    CBC: Recent Labs  Lab 11/13/17 1635 11/14/17 0112 11/15/17 0437 11/16/17 0307 11/17/17 0250  WBC 12.6* 10.9* 10.3 18.0* 15.1*  HGB 13.9 13.4 13.1 12.5* 12.0*  HCT 40.3 39.6 39.1 37.1* 35.9*  MCV 93.3 93.0 94.4 92.8 93.7  PLT 326 323 307 254 268   Cardiac Enzymes: Recent Labs  Lab 11/14/17 0112 11/14/17 0617 11/14/17 1301  TROPONINI 0.03* <0.03 <0.03   CBG: Recent Labs  Lab 11/16/17 1629 11/16/17 2014 11/17/17 0410 11/17/17 0747 11/17/17 1134  GLUCAP 365* 326* 248* 233* 358*   Microbiology Recent Results (from the past 240 hour(s))  MRSA PCR Screening     Status: Abnormal   Collection Time: 11/14/17  6:41 AM  Result Value Ref Range Status   MRSA by PCR POSITIVE (A) NEGATIVE Final    Comment:        The GeneXpert MRSA Assay (  FDA approved for NASAL specimens only), is one component of a comprehensive MRSA colonization surveillance program. It is not intended to diagnose MRSA infection nor to guide or monitor treatment for MRSA infections. RESULT CALLED TO, READ BACK BY AND VERIFIED WITH: D. Muhoro RN 9:50 11/14/17 ([email or web address])      Discharge Instructions:   Discharge Instructions    (HEART FAILURE PATIENTS) Call MD:  Anytime you have any of the following symptoms: 1) 3 pound weight gain in 24 hours or 5 pounds in 1 week 2) shortness of breath, with or without a dry hacking cough 3) swelling in the hands, feet or stomach 4) if you have to sleep on extra pillows at night in order to breathe.   Complete by:  As directed    Call MD for:  difficulty breathing, headache or visual disturbances   Complete by:  As directed    Call MD for:  extreme fatigue   Complete by:  As directed    Call MD for:  persistant dizziness or  light-headedness   Complete by:  As directed    Call MD for:  persistant nausea and vomiting   Complete by:  As directed    Call MD for:  severe uncontrolled pain   Complete by:  As directed    Diet - low sodium heart healthy   Complete by:  As directed    Diet Carb Modified   Complete by:  As directed    Discharge instructions   Complete by:  As directed    Hold Lasix and Potassium until 11/20/17. Do not take either of these medicines 11/18/17 or 11/19/17.   Increase activity slowly   Complete by:  As directed      Allergies as of 11/17/2017      Reactions   Statins Other (See Comments)   Causes stiffness in joints    Ace Inhibitors Cough   Contrast Media [iodinated Diagnostic Agents] Rash   Has to take benadryl prior to use   Penicillins Hives, Rash   Has patient had a PCN reaction causing immediate rash, facial/tongue/throat swelling, SOB or lightheadedness with hypotension: Yes Has patient had a PCN reaction causing severe rash involving mucus membranes or skin necrosis: No Has patient had a PCN reaction that required hospitalization pt was in the hospital at time of last reaction - heart attack Has patient had a PCN reaction occurring within the last 10 years: No If all of the above answers are "NO", then may proceed with Cephalosporin use.      Medication List    TAKE these medications   acetaminophen 325 MG tablet Commonly known as:  TYLENOL Take 650 mg by mouth every 4 (four) hours as needed for mild pain or headache.   ALPRAZolam 0.5 MG dissolvable tablet Commonly known as:  NIRAVAM Take 1 tablet by mouth as needed (Take as directed).   apixaban 5 MG Tabs tablet Commonly known as:  ELIQUIS Take 1 tablet (5 mg total) by mouth 2 (two) times daily.   aspirin 81 MG EC tablet Take 1 tablet (81 mg total) daily by mouth. Start taking on:  11/18/2017   carvedilol 12.5 MG tablet Commonly known as:  COREG Take 1 tablet (12.5 mg total) 2 (two) times daily with a  meal by mouth. What changed:    medication strength  how much to take   clotrimazole 1 % cream Commonly known as:  LOTRIMIN Apply 1 application topically 2 (two) times daily. What changed:  when to take this  reasons to take this   Evolocumab 140 MG/ML Soaj Commonly known as:  REPATHA SURECLICK Inject 741 mg into the skin every 14 (fourteen) days.   fenofibrate 145 MG tablet Commonly known as:  TRICOR Take 145 mg by mouth daily.   folic acid 1 MG tablet Commonly known as:  FOLVITE Take 2 mg by mouth daily.   furosemide 80 MG tablet Commonly known as:  LASIX Take 1 tablet (80 mg total) by mouth 2 (two) times daily. What changed:  how much to take   gabapentin 300 MG capsule Commonly known as:  NEURONTIN Take 300 mg by mouth at bedtime.   hydrocortisone 2.5 % rectal cream Commonly known as:  ANUSOL-HC Place 1 application rectally 2 (two) times daily as needed for hemorrhoids or itching.   insulin lispro protamine-lispro (50-50) 100 UNIT/ML Susp injection Commonly known as:  HUMALOG 50/50 MIX Inject 40-50 Units into the skin See admin instructions. Takes 50 units in am, 40 units midday, 50 units in pm   isosorbide mononitrate 60 MG 24 hr tablet Commonly known as:  IMDUR Take 1 tablet (60 mg total) daily by mouth. Start taking on:  11/18/2017 What changed:    medication strength  how much to take   leucovorin 10 MG tablet Commonly known as:  WELLCOVORIN Take 10 mg by mouth See admin instructions. Take 1 tablet (10 mg) by mouth every 12 hours and 24 hours after the methotrexate dose   LUMIGAN 0.01 % Soln Generic drug:  bimatoprost Place 1 drop into both eyes at bedtime.   magnesium oxide 400 (241.3 Mg) MG tablet Commonly known as:  MAG-OX Take 1 tablet (400 mg total) by mouth 2 (two) times daily.   methotrexate (PF) 50 MG/2ML injection Inject 15 mg into the muscle every Friday.   omega-3 acid ethyl esters 1 g capsule Commonly known as:  LOVAZA Take  1 capsule by mouth 2 (two) times daily.   omeprazole 20 MG capsule Commonly known as:  PRILOSEC Take 20 mg by mouth daily as needed (heartburn or acid reflux).   OSTEO BI-FLEX ADV DOUBLE ST PO Take 1 tablet by mouth daily as needed (takes occassionally).   PARoxetine 40 MG tablet Commonly known as:  PAXIL Take 40 mg by mouth daily.   potassium chloride SA 20 MEQ tablet Commonly known as:  K-DUR,KLOR-CON Take 2 tablets (40 mEq total) 2 (two) times daily by mouth.   predniSONE 5 MG tablet Commonly known as:  DELTASONE Take 5 mg by mouth daily with breakfast.   RANEXA 500 MG 12 hr tablet Generic drug:  ranolazine TAKE 1 TABLET(500 MG) BY MOUTH TWICE DAILY   sulfaSALAzine 500 MG tablet Commonly known as:  AZULFIDINE Take 500 mg by mouth daily.   tamsulosin 0.4 MG Caps capsule Commonly known as:  FLOMAX Take 0.4 mg by mouth daily.   tiZANidine 4 MG tablet Commonly known as:  ZANAFLEX Take 1 tablet by mouth every 8 (eight) hours as needed for muscle spasms.   topiramate 25 MG tablet Commonly known as:  TOPAMAX Take 25 mg by mouth 2 (two) times daily.   traMADol 50 MG tablet Commonly known as:  ULTRAM Take 1 tablet by mouth 3 (three) times daily as needed (pain). Take as directed   VITAMIN D (ERGOCALCIFEROL) PO Take 5,000 Units by mouth daily.   ZETIA 10 MG tablet Generic drug:  ezetimibe Take 10 mg by mouth daily.      Follow-up Information  Reynold Bowen, MD. Schedule an appointment as soon as possible for a visit in 1 week(s).   Specialty:  Endocrinology Why:  Hospital follow up and lab work. Contact information: [address] [place] [address] [phone]            Time coordinating discharge: 35 minutes.  SignedMargreta Journey Emelie Newsom  Pager [phone] [facility] 11/17/2017, 3:27 PM

## 2017-11-17 NOTE — Progress Notes (Addendum)
RT set up pt home CPAP from bag when transferred rooms. PT has home CPAP and places self on and off.  RT refilled Water chamber and Educated to call if needed anything else.

## 2017-11-18 ENCOUNTER — Encounter (HOSPITAL_COMMUNITY): Payer: Self-pay | Admitting: Interventional Cardiology

## 2017-11-25 DIAGNOSIS — N183 Chronic kidney disease, stage 3 (moderate): Secondary | ICD-10-CM | POA: Diagnosis not present

## 2017-11-28 ENCOUNTER — Encounter: Payer: Self-pay | Admitting: *Deleted

## 2017-11-28 ENCOUNTER — Encounter: Payer: Self-pay | Admitting: Cardiology

## 2017-11-28 ENCOUNTER — Ambulatory Visit (INDEPENDENT_AMBULATORY_CARE_PROVIDER_SITE_OTHER): Payer: Medicare Other | Admitting: Cardiology

## 2017-11-28 DIAGNOSIS — Z9289 Personal history of other medical treatment: Secondary | ICD-10-CM | POA: Insufficient documentation

## 2017-11-28 DIAGNOSIS — G8929 Other chronic pain: Secondary | ICD-10-CM | POA: Insufficient documentation

## 2017-11-28 DIAGNOSIS — J80 Acute respiratory distress syndrome: Secondary | ICD-10-CM | POA: Insufficient documentation

## 2017-11-28 DIAGNOSIS — Z9861 Coronary angioplasty status: Secondary | ICD-10-CM | POA: Diagnosis not present

## 2017-11-28 DIAGNOSIS — Z7901 Long term (current) use of anticoagulants: Secondary | ICD-10-CM | POA: Diagnosis not present

## 2017-11-28 DIAGNOSIS — A159 Respiratory tuberculosis unspecified: Secondary | ICD-10-CM | POA: Insufficient documentation

## 2017-11-28 DIAGNOSIS — G709 Myoneural disorder, unspecified: Secondary | ICD-10-CM | POA: Insufficient documentation

## 2017-11-28 DIAGNOSIS — R0982 Postnasal drip: Secondary | ICD-10-CM | POA: Insufficient documentation

## 2017-11-28 DIAGNOSIS — N183 Chronic kidney disease, stage 3 unspecified: Secondary | ICD-10-CM

## 2017-11-28 DIAGNOSIS — E559 Vitamin D deficiency, unspecified: Secondary | ICD-10-CM | POA: Insufficient documentation

## 2017-11-28 DIAGNOSIS — I200 Unstable angina: Secondary | ICD-10-CM | POA: Diagnosis not present

## 2017-11-28 DIAGNOSIS — G250 Essential tremor: Secondary | ICD-10-CM | POA: Insufficient documentation

## 2017-11-28 DIAGNOSIS — L570 Actinic keratosis: Secondary | ICD-10-CM | POA: Diagnosis not present

## 2017-11-28 DIAGNOSIS — H9190 Unspecified hearing loss, unspecified ear: Secondary | ICD-10-CM | POA: Insufficient documentation

## 2017-11-28 DIAGNOSIS — G4700 Insomnia, unspecified: Secondary | ICD-10-CM | POA: Insufficient documentation

## 2017-11-28 DIAGNOSIS — F329 Major depressive disorder, single episode, unspecified: Secondary | ICD-10-CM | POA: Insufficient documentation

## 2017-11-28 DIAGNOSIS — R399 Unspecified symptoms and signs involving the genitourinary system: Secondary | ICD-10-CM | POA: Insufficient documentation

## 2017-11-28 DIAGNOSIS — R0609 Other forms of dyspnea: Secondary | ICD-10-CM

## 2017-11-28 DIAGNOSIS — B2790 Infectious mononucleosis, unspecified without complication: Secondary | ICD-10-CM | POA: Insufficient documentation

## 2017-11-28 DIAGNOSIS — I420 Dilated cardiomyopathy: Secondary | ICD-10-CM | POA: Insufficient documentation

## 2017-11-28 DIAGNOSIS — F32A Depression, unspecified: Secondary | ICD-10-CM | POA: Insufficient documentation

## 2017-11-28 DIAGNOSIS — F419 Anxiety disorder, unspecified: Secondary | ICD-10-CM | POA: Insufficient documentation

## 2017-11-28 DIAGNOSIS — M069 Rheumatoid arthritis, unspecified: Secondary | ICD-10-CM | POA: Insufficient documentation

## 2017-11-28 DIAGNOSIS — R600 Localized edema: Secondary | ICD-10-CM | POA: Insufficient documentation

## 2017-11-28 DIAGNOSIS — M4800 Spinal stenosis, site unspecified: Secondary | ICD-10-CM | POA: Insufficient documentation

## 2017-11-28 DIAGNOSIS — I219 Acute myocardial infarction, unspecified: Secondary | ICD-10-CM

## 2017-11-28 DIAGNOSIS — K219 Gastro-esophageal reflux disease without esophagitis: Secondary | ICD-10-CM

## 2017-11-28 DIAGNOSIS — I5042 Chronic combined systolic (congestive) and diastolic (congestive) heart failure: Secondary | ICD-10-CM

## 2017-11-28 DIAGNOSIS — I255 Ischemic cardiomyopathy: Secondary | ICD-10-CM | POA: Insufficient documentation

## 2017-11-28 DIAGNOSIS — M549 Dorsalgia, unspecified: Secondary | ICD-10-CM

## 2017-11-28 DIAGNOSIS — M1990 Unspecified osteoarthritis, unspecified site: Secondary | ICD-10-CM | POA: Insufficient documentation

## 2017-11-28 DIAGNOSIS — B159 Hepatitis A without hepatic coma: Secondary | ICD-10-CM | POA: Insufficient documentation

## 2017-11-28 DIAGNOSIS — D649 Anemia, unspecified: Secondary | ICD-10-CM | POA: Insufficient documentation

## 2017-11-28 DIAGNOSIS — I2510 Atherosclerotic heart disease of native coronary artery without angina pectoris: Secondary | ICD-10-CM

## 2017-11-28 DIAGNOSIS — G629 Polyneuropathy, unspecified: Secondary | ICD-10-CM | POA: Insufficient documentation

## 2017-11-28 DIAGNOSIS — L0291 Cutaneous abscess, unspecified: Secondary | ICD-10-CM | POA: Insufficient documentation

## 2017-11-28 DIAGNOSIS — J329 Chronic sinusitis, unspecified: Secondary | ICD-10-CM | POA: Insufficient documentation

## 2017-11-28 DIAGNOSIS — E119 Type 2 diabetes mellitus without complications: Secondary | ICD-10-CM

## 2017-11-28 MED ORDER — PANTOPRAZOLE SODIUM 40 MG PO TBEC: 40.0000 mg | DELAYED_RELEASE_TABLET | Freq: Every day | ORAL | 1 refills | Status: DC

## 2017-11-28 MED ORDER — CLOPIDOGREL BISULFATE 75 MG PO TABS: 75.0000 mg | ORAL_TABLET | Freq: Every day | ORAL | 1 refills | Status: DC

## 2017-11-28 NOTE — Assessment & Plan Note (Signed)
His SCr had been around 1.5, now appears to be closer to 2-2.5

## 2017-11-28 NOTE — Progress Notes (Signed)
11/28/2017 Julian Ross   1946/10/13  [id number]  Primary Physician Reynold Bowen, MD Primary Cardiologist: Dr [name]  HPI:  71 y.o.malewith chronic systolic and diastolic heart failure, CRI 3-4, ermanentl atrial fibrillation on Eliquis, and CAD -s/p multiple PCIs, ( previously had 16 stents placed in [place], [place]). He had a LHC 10/03/16 which showed diffuse, three-vessel CAD with multiple areas of prior stenting. He has been evaluated by Dr [name] then and recommendation was made for medical therapy, with consideration CABG if this failed. He was seen in the office 10/30/17 with volume overload. His diuretics were increased and an echo was done 11/04/17 which showed his EF to be 45-50%. He followed up with dr Hubbard Hartshorn 11/13/17 and was having unstable chest pain and was admitted. Troponins were negative. He had a cath 11/15/17 which sowed diffuse CAD. Dr Nils Pyle recomended medical Rx. The plan was for ASA, Plavix, and Eliquis for 30 days, then drop the ASA. His diuretics were also held for a few days as his LVEDP was not high at cath and his SCr was up to 2.4. His was down to 211 lbs.   He is in the office today for follow up. He just recently resumed his Lasix 80 mg BID. He never got the instructions to start Plavix. His wgt is up to 224 lbs but he denies increased dyspnea. He has not had further chest pain on increased Imdur.     Current Outpatient Medications  Medication Sig Dispense Refill  . acetaminophen (TYLENOL) 325 MG tablet Take 650 mg by mouth every 4 (four) hours as needed for mild pain or headache.    . ALPRAZolam (NIRAVAM) 0.5 MG dissolvable tablet Take 1 tablet by mouth as needed (Take as directed).   2  . apixaban (ELIQUIS) 5 MG TABS tablet Take 1 tablet (5 mg total) by mouth 2 (two) times daily. 60 tablet   . aspirin EC 81 MG EC tablet Take 1 tablet (81 mg total) daily by mouth.    . carvedilol (COREG) 12.5 MG tablet Take 1 tablet (12.5 mg total) 2 (two) times daily  with a meal by mouth. 60 tablet 2  . clotrimazole (LOTRIMIN) 1 % cream Apply 1 application topically 2 (two) times daily. (Patient taking differently: Apply 1 application 2 (two) times daily as needed topically (dry skin). ) 30 g 0  . Evolocumab (REPATHA SURECLICK) 681 MG/ML SOAJ Inject 140 mg into the skin every 14 (fourteen) days. 2 pen 11  . fenofibrate (TRICOR) 145 MG tablet Take 145 mg by mouth daily.    . folic acid (FOLVITE) 1 MG tablet Take 2 mg by mouth daily.     . furosemide (LASIX) 80 MG tablet Take 1 tablet (80 mg total) by mouth 2 (two) times daily. (Patient taking differently: Take 100 mg 2 (two) times daily by mouth. ) 180 tablet 3  . gabapentin (NEURONTIN) 300 MG capsule Take 300 mg by mouth at bedtime.   2  . hydrocortisone (ANUSOL-HC) 2.5 % rectal cream Place 1 application rectally 2 (two) times daily as needed for hemorrhoids or itching. 30 g 0  . insulin lispro protamine-lispro (HUMALOG 50/50 MIX) (50-50) 100 UNIT/ML SUSP injection Inject 40-50 Units into the skin See admin instructions. Takes 50 units in am, 40 units midday, 50 units in pm    . isosorbide mononitrate (IMDUR) 60 MG 24 hr tablet Take 1 tablet (60 mg total) daily by mouth. 60 tablet 2  . leucovorin (WELLCOVORIN) 10 MG tablet  Take 10 mg by mouth See admin instructions. Take 1 tablet (10 mg) by mouth every 12 hours and 24 hours after the methotrexate dose    . LUMIGAN 0.01 % SOLN Place 1 drop into both eyes at bedtime.   0  . magnesium oxide (MAG-OX) 400 (241.3 Mg) MG tablet Take 1 tablet (400 mg total) by mouth 2 (two) times daily. 60 tablet 0  . Methotrexate Sodium (METHOTREXATE, PF,) 50 MG/2ML injection Inject 15 mg into the muscle every Friday.   0  . Misc Natural Products (OSTEO BI-FLEX ADV DOUBLE ST PO) Take 1 tablet by mouth daily as needed (takes occassionally).     Marland Kitchen omega-3 acid ethyl esters (LOVAZA) 1 g capsule Take 1 capsule by mouth 2 (two) times daily.  11  . PARoxetine (PAXIL) 40 MG tablet Take 40 mg  by mouth daily.   11  . potassium chloride SA (K-DUR,KLOR-CON) 20 MEQ tablet Take 2 tablets (40 mEq total) 2 (two) times daily by mouth. 120 tablet 5  . predniSONE (DELTASONE) 5 MG tablet Take 5 mg by mouth daily with breakfast.    . RANEXA 500 MG 12 hr tablet TAKE 1 TABLET(500 MG) BY MOUTH TWICE DAILY 180 tablet 2  . sulfaSALAzine (AZULFIDINE) 500 MG tablet Take 500 mg by mouth daily.    . tamsulosin (FLOMAX) 0.4 MG CAPS capsule Take 0.4 mg by mouth daily.   4  . tiZANidine (ZANAFLEX) 4 MG tablet Take 1 tablet by mouth every 8 (eight) hours as needed for muscle spasms.   2  . topiramate (TOPAMAX) 25 MG tablet Take 25 mg by mouth 2 (two) times daily.    . traMADol (ULTRAM) 50 MG tablet Take 1 tablet by mouth 3 (three) times daily as needed (pain). Take as directed  2  . VITAMIN D, ERGOCALCIFEROL, PO Take 5,000 Units by mouth daily.     Marland Kitchen ZETIA 10 MG tablet Take 10 mg by mouth daily.   11  . clopidogrel (PLAVIX) 75 MG tablet Take 1 tablet (75 mg total) by mouth daily. 90 tablet 1  . pantoprazole (PROTONIX) 40 MG tablet Take 1 tablet (40 mg total) by mouth daily. 90 tablet 1   No current facility-administered medications for this visit.     Allergies  Allergen Reactions  . Statins Other (See Comments)    Causes stiffness in joints   . Ace Inhibitors Cough  . Contrast Media [Iodinated Diagnostic Agents] Rash    Has to take benadryl prior to use  . Penicillins Hives and Rash    Has patient had a PCN reaction causing immediate rash, facial/tongue/throat swelling, SOB or lightheadedness with hypotension: Yes Has patient had a PCN reaction causing severe rash involving mucus membranes or skin necrosis: No Has patient had a PCN reaction that required hospitalization pt was in the hospital at time of last reaction - heart attack Has patient had a PCN reaction occurring within the last 10 years: No If all of the above answers are "NO", then may proceed with Cephalosporin use.    Past Medical  History:  Diagnosis Date  . Anemia   . Anxiety state   . CAD (coronary artery disease)    16 prior stents (at [facility])  . Cardiomyopathy, ischemic   . Chronic lower back pain   . Chronic systolic CHF (congestive heart failure) ([place])   . CKD (chronic kidney disease), stage III ([place])   . Depression   . Diabetes mellitus type 2 in  obese ([place])   . Fibromyalgia   . Hepatitis A    at age 89  . High cholesterol   . History of blood transfusion    "related to OR"  . Hypertension   . Hypokalemia   . MI (myocardial infarction) ([place]) 1990; 1995; 1997  . OSA on CPAP    setting = 13, full face mask  . Osteoarthritis   . Persistent atrial fibrillation ([place]) 02/2016   on Eliquis  . Rheumatoid arthritis of multiple sites with negative rheumatoid factor ([place])   . Septic shock ([place]) 02/2016   in setting of severe pneumonia    Social History   Socioeconomic History  . Marital status: Married    Spouse name: Not on file  . Number of children: Not on file  . Years of education: Not on file  . Highest education level: Not on file  Social Needs  . Financial resource strain: Not on file  . Food insecurity - worry: Not on file  . Food insecurity - inability: Not on file  . Transportation needs - medical: Not on file  . Transportation needs - non-medical: Not on file  Occupational History  . Occupation: retired Pharmacist, community  Tobacco Use  . Smoking status: Never Smoker  . Smokeless tobacco: Never Used  Substance and Sexual Activity  . Alcohol use: No  . Drug use: No  . Sexual activity: Not Currently  Other Topics Concern  . Not on file  Social History Narrative   [employer], Dentist retired on disability.  Lived in [place] but recently moved to [place].  6 sons, 21 grandchildren.     Family History  Problem Relation Age of Onset  . Heart failure Mother   . Hyperlipidemia Mother   . Hypertension Mother   . Heart attack Father   . Heart attack Brother      Review of  Systems: General: negative for chills, fever, night sweats or weight changes.  Cardiovascular: negative for chest pain, dyspnea on exertion, edema, orthopnea, palpitations, paroxysmal nocturnal dyspnea or shortness of breath Dermatological: negative for rash Respiratory: negative for cough or wheezing Urologic: negative for hematuria Abdominal: negative for nausea, vomiting, diarrhea, bright red blood per rectum, melena, or hematemesis Neurologic: negative for visual changes, syncope, or dizziness All other systems reviewed and are otherwise negative except as noted above.    Blood pressure (!) 160/88, pulse 86, height 5\' 11"  (1.803 m), weight 224 lb (101.6 kg), SpO2 96 %.  General appearance: alert, cooperative, appears stated age and no distress Lungs: basilar crackles Rt > Lt Heart: irregularly irregular rhythm Extremities: 1+ LE edema Skin: Skin color, texture, turgor normal. No rashes or lesions Neurologic: Grossly normal   ASSESSMENT AND PLAN:   Angina pectoris, crescendo ([place]) Admitted 11/13/17-11/17/17- cath- plan is for medical rx  Chronic combined systolic and diastolic CHF (congestive heart failure) ([place]) EF 45% Oct 2018 echo His wgt is up but he only recently resumed his diuretic  GERD (gastroesophageal reflux disease) He does not think Prilosec is working. Changed to Protonix now that he is on Plavix  Chronic anticoagulation Plan is for ASA, Plavix, and Eliquis for 30 days then drop ASA His Eliquis may need to be decreased or changed to Coumadin if his renal function continues to deteriorate  CKD (chronic kidney disease) stage 3, GFR 30-59 ml/min (HCC) His SCr had been around 1.5, now appears to be closer to 2-2.5   PLAN  Same cardiac Rx. F/U OV in 1-2 weeks, check  BMP then. I gave him and Rx for Plavix and instructions to stop ASA after 30 days.   Kerin Ransom PA-C 11/28/2017 10:54 AM

## 2017-11-28 NOTE — Assessment & Plan Note (Signed)
EF 45% Oct 2018 echo His wgt is up but he only recently resumed his diuretic

## 2017-11-28 NOTE — Assessment & Plan Note (Signed)
Admitted 11/13/17-11/17/17- cath- plan is for medical rx

## 2017-11-28 NOTE — Assessment & Plan Note (Signed)
He does not think Prilosec is working. Changed to Protonix now that he is on Plavix

## 2017-11-28 NOTE — Patient Instructions (Signed)
Medication Instructions: START Plavix 75 mg daily. ---STOP Aspirin 30 days after starting Plavix.   STOP Prilosec (Omeprazole) ---START Protonix (Pantoprazole) 40 mg daily.   Follow-Up: We have scheduled an appointment for you to follow-up with Dr. [name] on 12/10/17 at 8:20 AM.  If you need a refill on your cardiac medications before your next appointment, please call your pharmacy.

## 2017-11-28 NOTE — Assessment & Plan Note (Signed)
Plan is for ASA, Plavix, and Eliquis for 30 days then drop ASA His Eliquis may need to be decreased or changed to Coumadin if his renal function continues to deteriorate

## 2017-12-02 ENCOUNTER — Other Ambulatory Visit: Payer: Self-pay | Admitting: Cardiology

## 2017-12-05 DIAGNOSIS — D649 Anemia, unspecified: Secondary | ICD-10-CM | POA: Diagnosis not present

## 2017-12-05 DIAGNOSIS — M9983 Other biomechanical lesions of lumbar region: Secondary | ICD-10-CM | POA: Diagnosis not present

## 2017-12-05 DIAGNOSIS — R251 Tremor, unspecified: Secondary | ICD-10-CM | POA: Diagnosis not present

## 2017-12-05 DIAGNOSIS — Z79899 Other long term (current) drug therapy: Secondary | ICD-10-CM | POA: Diagnosis not present

## 2017-12-05 DIAGNOSIS — M0579 Rheumatoid arthritis with rheumatoid factor of multiple sites without organ or systems involvement: Secondary | ICD-10-CM | POA: Diagnosis not present

## 2017-12-09 ENCOUNTER — Other Ambulatory Visit: Payer: Self-pay | Admitting: Cardiology

## 2017-12-10 ENCOUNTER — Ambulatory Visit: Payer: Medicare Other | Admitting: Cardiovascular Disease

## 2017-12-11 ENCOUNTER — Other Ambulatory Visit: Payer: Self-pay

## 2017-12-11 DIAGNOSIS — Z6830 Body mass index (BMI) 30.0-30.9, adult: Secondary | ICD-10-CM | POA: Diagnosis not present

## 2017-12-11 DIAGNOSIS — E1129 Type 2 diabetes mellitus with other diabetic kidney complication: Secondary | ICD-10-CM | POA: Diagnosis not present

## 2017-12-11 DIAGNOSIS — S90414A Abrasion, right lesser toe(s), initial encounter: Secondary | ICD-10-CM | POA: Diagnosis not present

## 2017-12-11 DIAGNOSIS — E1142 Type 2 diabetes mellitus with diabetic polyneuropathy: Secondary | ICD-10-CM | POA: Diagnosis not present

## 2017-12-11 MED ORDER — NITROGLYCERIN 0.4 MG SL SUBL: SUBLINGUAL_TABLET | SUBLINGUAL | 0 refills | Status: DC

## 2017-12-12 ENCOUNTER — Telehealth: Payer: Self-pay | Admitting: Pharmacist

## 2017-12-12 NOTE — Telephone Encounter (Signed)
Lmom; repatha approved by insurance until dec/2019. Will mail paperwork for Repatha safety net to complete and bring back to offiice

## 2017-12-16 DIAGNOSIS — Z6831 Body mass index (BMI) 31.0-31.9, adult: Secondary | ICD-10-CM | POA: Diagnosis not present

## 2017-12-16 DIAGNOSIS — E1129 Type 2 diabetes mellitus with other diabetic kidney complication: Secondary | ICD-10-CM | POA: Diagnosis not present

## 2017-12-16 DIAGNOSIS — E7849 Other hyperlipidemia: Secondary | ICD-10-CM | POA: Diagnosis not present

## 2017-12-16 DIAGNOSIS — I2510 Atherosclerotic heart disease of native coronary artery without angina pectoris: Secondary | ICD-10-CM | POA: Diagnosis not present

## 2017-12-16 DIAGNOSIS — I5040 Unspecified combined systolic (congestive) and diastolic (congestive) heart failure: Secondary | ICD-10-CM | POA: Diagnosis not present

## 2017-12-16 DIAGNOSIS — N183 Chronic kidney disease, stage 3 (moderate): Secondary | ICD-10-CM | POA: Diagnosis not present

## 2017-12-16 DIAGNOSIS — I480 Paroxysmal atrial fibrillation: Secondary | ICD-10-CM | POA: Diagnosis not present

## 2017-12-17 ENCOUNTER — Other Ambulatory Visit: Payer: Self-pay | Admitting: Cardiology

## 2017-12-26 ENCOUNTER — Other Ambulatory Visit: Payer: Self-pay

## 2017-12-26 MED ORDER — OMEGA-3-ACID ETHYL ESTERS 1 G PO CAPS: 1.0000 | ORAL_CAPSULE | Freq: Two times a day (BID) | ORAL | 10 refills | Status: DC

## 2017-12-31 ENCOUNTER — Other Ambulatory Visit: Payer: Self-pay | Admitting: Cardiovascular Disease

## 2018-01-01 NOTE — Telephone Encounter (Signed)
Refill Request.  

## 2018-01-02 DIAGNOSIS — E559 Vitamin D deficiency, unspecified: Secondary | ICD-10-CM | POA: Diagnosis not present

## 2018-01-02 DIAGNOSIS — R82998 Other abnormal findings in urine: Secondary | ICD-10-CM | POA: Diagnosis not present

## 2018-01-02 DIAGNOSIS — I10 Essential (primary) hypertension: Secondary | ICD-10-CM | POA: Diagnosis not present

## 2018-01-02 DIAGNOSIS — E7849 Other hyperlipidemia: Secondary | ICD-10-CM | POA: Diagnosis not present

## 2018-01-02 DIAGNOSIS — E1129 Type 2 diabetes mellitus with other diabetic kidney complication: Secondary | ICD-10-CM | POA: Diagnosis not present

## 2018-01-02 DIAGNOSIS — Z125 Encounter for screening for malignant neoplasm of prostate: Secondary | ICD-10-CM | POA: Diagnosis not present

## 2018-01-14 DIAGNOSIS — H401132 Primary open-angle glaucoma, bilateral, moderate stage: Secondary | ICD-10-CM | POA: Diagnosis not present

## 2018-01-14 DIAGNOSIS — Z1212 Encounter for screening for malignant neoplasm of rectum: Secondary | ICD-10-CM | POA: Diagnosis not present

## 2018-01-22 DIAGNOSIS — M25542 Pain in joints of left hand: Secondary | ICD-10-CM | POA: Diagnosis not present

## 2018-01-22 DIAGNOSIS — M0579 Rheumatoid arthritis with rheumatoid factor of multiple sites without organ or systems involvement: Secondary | ICD-10-CM | POA: Diagnosis not present

## 2018-01-22 DIAGNOSIS — M25541 Pain in joints of right hand: Secondary | ICD-10-CM | POA: Diagnosis not present

## 2018-01-22 DIAGNOSIS — Z79899 Other long term (current) drug therapy: Secondary | ICD-10-CM | POA: Diagnosis not present

## 2018-01-22 DIAGNOSIS — R251 Tremor, unspecified: Secondary | ICD-10-CM | POA: Diagnosis not present

## 2018-01-28 DIAGNOSIS — Z981 Arthrodesis status: Secondary | ICD-10-CM | POA: Diagnosis not present

## 2018-01-28 DIAGNOSIS — M545 Low back pain: Secondary | ICD-10-CM | POA: Diagnosis not present

## 2018-01-28 DIAGNOSIS — G8929 Other chronic pain: Secondary | ICD-10-CM | POA: Diagnosis not present

## 2018-01-28 DIAGNOSIS — M5134 Other intervertebral disc degeneration, thoracic region: Secondary | ICD-10-CM | POA: Diagnosis not present

## 2018-01-28 DIAGNOSIS — M4326 Fusion of spine, lumbar region: Secondary | ICD-10-CM | POA: Diagnosis not present

## 2018-01-28 DIAGNOSIS — M79604 Pain in right leg: Secondary | ICD-10-CM | POA: Diagnosis not present

## 2018-01-28 DIAGNOSIS — M5136 Other intervertebral disc degeneration, lumbar region: Secondary | ICD-10-CM | POA: Diagnosis not present

## 2018-01-28 DIAGNOSIS — M5442 Lumbago with sciatica, left side: Secondary | ICD-10-CM | POA: Diagnosis not present

## 2018-01-28 DIAGNOSIS — M5441 Lumbago with sciatica, right side: Secondary | ICD-10-CM | POA: Diagnosis not present

## 2018-01-28 DIAGNOSIS — M79605 Pain in left leg: Secondary | ICD-10-CM | POA: Diagnosis not present

## 2018-01-28 DIAGNOSIS — R29898 Other symptoms and signs involving the musculoskeletal system: Secondary | ICD-10-CM | POA: Diagnosis not present

## 2018-02-03 DIAGNOSIS — E1140 Type 2 diabetes mellitus with diabetic neuropathy, unspecified: Secondary | ICD-10-CM | POA: Diagnosis not present

## 2018-02-03 DIAGNOSIS — I5040 Unspecified combined systolic (congestive) and diastolic (congestive) heart failure: Secondary | ICD-10-CM | POA: Diagnosis not present

## 2018-02-03 DIAGNOSIS — E7849 Other hyperlipidemia: Secondary | ICD-10-CM | POA: Diagnosis not present

## 2018-02-03 DIAGNOSIS — I779 Disorder of arteries and arterioles, unspecified: Secondary | ICD-10-CM | POA: Diagnosis not present

## 2018-02-03 DIAGNOSIS — Z23 Encounter for immunization: Secondary | ICD-10-CM | POA: Diagnosis not present

## 2018-02-03 DIAGNOSIS — E1142 Type 2 diabetes mellitus with diabetic polyneuropathy: Secondary | ICD-10-CM | POA: Diagnosis not present

## 2018-02-03 DIAGNOSIS — Z0000 Encounter for general adult medical examination without abnormal findings: Secondary | ICD-10-CM | POA: Diagnosis not present

## 2018-02-03 DIAGNOSIS — Z6829 Body mass index (BMI) 29.0-29.9, adult: Secondary | ICD-10-CM | POA: Diagnosis not present

## 2018-02-03 DIAGNOSIS — E559 Vitamin D deficiency, unspecified: Secondary | ICD-10-CM | POA: Diagnosis not present

## 2018-02-03 DIAGNOSIS — Z1389 Encounter for screening for other disorder: Secondary | ICD-10-CM | POA: Diagnosis not present

## 2018-02-03 DIAGNOSIS — I10 Essential (primary) hypertension: Secondary | ICD-10-CM | POA: Diagnosis not present

## 2018-02-03 DIAGNOSIS — I480 Paroxysmal atrial fibrillation: Secondary | ICD-10-CM | POA: Diagnosis not present

## 2018-02-05 DIAGNOSIS — M0579 Rheumatoid arthritis with rheumatoid factor of multiple sites without organ or systems involvement: Secondary | ICD-10-CM | POA: Diagnosis not present

## 2018-02-07 ENCOUNTER — Other Ambulatory Visit: Payer: Self-pay | Admitting: Cardiovascular Disease

## 2018-02-07 NOTE — Telephone Encounter (Signed)
Please review for refill, thanks ! 

## 2018-02-11 DIAGNOSIS — Z9181 History of falling: Secondary | ICD-10-CM | POA: Diagnosis not present

## 2018-02-11 DIAGNOSIS — J84112 Idiopathic pulmonary fibrosis: Secondary | ICD-10-CM | POA: Diagnosis not present

## 2018-02-11 DIAGNOSIS — G4733 Obstructive sleep apnea (adult) (pediatric): Secondary | ICD-10-CM | POA: Diagnosis not present

## 2018-02-13 ENCOUNTER — Telehealth: Payer: Self-pay | Admitting: *Deleted

## 2018-02-13 MED ORDER — CARVEDILOL 12.5 MG PO TABS: 12.5000 mg | ORAL_TABLET | Freq: Two times a day (BID) | ORAL | 3 refills | Status: DC

## 2018-02-13 NOTE — Telephone Encounter (Signed)
Received notification from patients insurance he had two active Carvedilol Rx's Spoke with patient and confirmed he is taking Carvedilol 12.5 mg twice a day Spoke with [facility] and updated Rx

## 2018-02-19 DIAGNOSIS — M0579 Rheumatoid arthritis with rheumatoid factor of multiple sites without organ or systems involvement: Secondary | ICD-10-CM | POA: Diagnosis not present

## 2018-03-05 DIAGNOSIS — M0579 Rheumatoid arthritis with rheumatoid factor of multiple sites without organ or systems involvement: Secondary | ICD-10-CM | POA: Diagnosis not present

## 2018-03-06 DIAGNOSIS — I10 Essential (primary) hypertension: Secondary | ICD-10-CM | POA: Diagnosis not present

## 2018-03-06 DIAGNOSIS — Z794 Long term (current) use of insulin: Secondary | ICD-10-CM | POA: Diagnosis not present

## 2018-03-06 DIAGNOSIS — Z6830 Body mass index (BMI) 30.0-30.9, adult: Secondary | ICD-10-CM | POA: Diagnosis not present

## 2018-03-06 DIAGNOSIS — E1140 Type 2 diabetes mellitus with diabetic neuropathy, unspecified: Secondary | ICD-10-CM | POA: Diagnosis not present

## 2018-03-06 DIAGNOSIS — N184 Chronic kidney disease, stage 4 (severe): Secondary | ICD-10-CM | POA: Diagnosis not present

## 2018-03-10 ENCOUNTER — Encounter: Payer: Medicare Other | Admitting: Thoracic Surgery (Cardiothoracic Vascular Surgery)

## 2018-03-17 ENCOUNTER — Encounter: Payer: Medicare Other | Admitting: Thoracic Surgery (Cardiothoracic Vascular Surgery)

## 2018-03-29 ENCOUNTER — Other Ambulatory Visit: Payer: Self-pay | Admitting: Cardiovascular Disease

## 2018-03-31 NOTE — Telephone Encounter (Signed)
Refill Request.  

## 2018-04-01 ENCOUNTER — Other Ambulatory Visit: Payer: Self-pay | Admitting: Cardiovascular Disease

## 2018-04-01 NOTE — Telephone Encounter (Signed)
Please review for refill, thanks ! 

## 2018-04-17 DIAGNOSIS — M0579 Rheumatoid arthritis with rheumatoid factor of multiple sites without organ or systems involvement: Secondary | ICD-10-CM | POA: Diagnosis not present

## 2018-04-21 ENCOUNTER — Telehealth: Payer: Self-pay | Admitting: Cardiology

## 2018-04-21 NOTE — Telephone Encounter (Signed)
Patient called in stating that he was short of breath today. He denies chest pain. This occurs on exertion only. He is currently prescribed Furosemide 80 mg bid. The patient stated that he did not take his furosemide today because "it makes him get up too much." He has been advised to take his furosemide and call the office back with an update. He has been advised that he may be retaining fluid which could be causing his shortness of breath. He verbalized his understanding and will call back with an update.

## 2018-04-24 ENCOUNTER — Other Ambulatory Visit: Payer: Self-pay

## 2018-04-24 MED ORDER — CARVEDILOL 12.5 MG PO TABS: 12.5000 mg | ORAL_TABLET | Freq: Two times a day (BID) | ORAL | 1 refills | Status: DC

## 2018-04-30 ENCOUNTER — Other Ambulatory Visit: Payer: Self-pay | Admitting: Cardiovascular Disease

## 2018-04-30 NOTE — Telephone Encounter (Signed)
Rx sent to pharmacy   

## 2018-05-01 ENCOUNTER — Other Ambulatory Visit: Payer: Self-pay | Admitting: Cardiovascular Disease

## 2018-05-01 ENCOUNTER — Telehealth: Payer: Self-pay | Admitting: *Deleted

## 2018-05-01 NOTE — Telephone Encounter (Signed)
Patient walked into the office today requesting an EKG. Patient reports SOB that started about 3 weeks ago but has gotten better now. He denies orthopnea or edema and reports his weight is down to 205 lb from 230 lb. He reports taking extra lasix to get his weight down. He is also complaining of lower abdominal discomfort that he reports is there all the time. He has also lost his appetite and that is another reason he has lost weight. He denies nausea, vomiting, diarrhea or constipation. He was asking for an EKG just to make sure everything is okay. He denies any chest pain. Follow up appointment made for the patient to see dr [name] on monday 05-05-18. He was encouraged to contact his medical doctor regarding his abdominal pain.Pt agreed with this plan.

## 2018-05-01 NOTE — Telephone Encounter (Signed)
REFILL 

## 2018-05-05 ENCOUNTER — Ambulatory Visit (INDEPENDENT_AMBULATORY_CARE_PROVIDER_SITE_OTHER): Payer: Medicare Other | Admitting: Cardiovascular Disease

## 2018-05-05 ENCOUNTER — Encounter: Payer: Self-pay | Admitting: Cardiovascular Disease

## 2018-05-05 ENCOUNTER — Other Ambulatory Visit: Payer: Self-pay | Admitting: Cardiovascular Disease

## 2018-05-05 ENCOUNTER — Telehealth: Payer: Self-pay

## 2018-05-05 VITALS — BP 123/71 | HR 87 | Ht 71.0 in | Wt 203.2 lb

## 2018-05-05 DIAGNOSIS — N183 Chronic kidney disease, stage 3 unspecified: Secondary | ICD-10-CM

## 2018-05-05 DIAGNOSIS — R0602 Shortness of breath: Secondary | ICD-10-CM

## 2018-05-05 DIAGNOSIS — I10 Essential (primary) hypertension: Secondary | ICD-10-CM | POA: Diagnosis not present

## 2018-05-05 DIAGNOSIS — E7800 Pure hypercholesterolemia, unspecified: Secondary | ICD-10-CM | POA: Diagnosis not present

## 2018-05-05 DIAGNOSIS — I200 Unstable angina: Secondary | ICD-10-CM | POA: Diagnosis not present

## 2018-05-05 DIAGNOSIS — I5042 Chronic combined systolic (congestive) and diastolic (congestive) heart failure: Secondary | ICD-10-CM | POA: Diagnosis not present

## 2018-05-05 DIAGNOSIS — R079 Chest pain, unspecified: Secondary | ICD-10-CM | POA: Diagnosis not present

## 2018-05-05 LAB — COMPREHENSIVE METABOLIC PANEL
ALT: 14 IU/L (ref 0–44)
AST: 31 IU/L (ref 0–40)
Albumin/Globulin Ratio: 1 — ABNORMAL LOW (ref 1.2–2.2)
Albumin: 3.6 g/dL (ref 3.5–4.8)
Alkaline Phosphatase: 55 IU/L (ref 39–117)
BILIRUBIN TOTAL: 0.4 mg/dL (ref 0.0–1.2)
BUN/Creatinine Ratio: 19 (ref 10–24)
BUN: 42 mg/dL — AB (ref 8–27)
CALCIUM: 9.4 mg/dL (ref 8.6–10.2)
CHLORIDE: 82 mmol/L — AB (ref 96–106)
CO2: 33 mmol/L — ABNORMAL HIGH (ref 20–29)
Creatinine, Ser: 2.26 mg/dL — ABNORMAL HIGH (ref 0.76–1.27)
GFR calc Af Amer: 33 mL/min/{1.73_m2} — ABNORMAL LOW (ref >59)
GFR, EST NON AFRICAN AMERICAN: 28 mL/min/{1.73_m2} — AB (ref >59)
GLUCOSE: 188 mg/dL — AB (ref 65–99)
Globulin, Total: 3.7 g/dL (ref 1.5–4.5)
POTASSIUM: 3.1 mmol/L — AB (ref 3.5–5.2)
Sodium: 133 mmol/L — ABNORMAL LOW (ref 134–144)
TOTAL PROTEIN: 7.3 g/dL (ref 6.0–8.5)

## 2018-05-05 LAB — LIPID PANEL
Chol/HDL Ratio: 3.6 ratio (ref 0.0–5.0)
Cholesterol, Total: 167 mg/dL (ref 100–199)
HDL: 46 mg/dL (ref >39)
LDL CALC: 93 mg/dL (ref 0–99)
TRIGLYCERIDES: 139 mg/dL (ref 0–149)
VLDL Cholesterol Cal: 28 mg/dL (ref 5–40)

## 2018-05-05 LAB — TROPONIN I: TROPONIN I: 0.08 ng/mL — AB (ref 0.00–0.04)

## 2018-05-05 LAB — PRO B NATRIURETIC PEPTIDE: NT-Pro BNP: 622 pg/mL — ABNORMAL HIGH (ref 0–376)

## 2018-05-05 MED ORDER — RANOLAZINE ER 1000 MG PO TB12: 1000.0000 mg | ORAL_TABLET | Freq: Two times a day (BID) | ORAL | 5 refills | Status: AC

## 2018-05-05 NOTE — Telephone Encounter (Signed)
Received a call from [place] for critical troponin of 0.08. MD made aware.

## 2018-05-05 NOTE — Progress Notes (Signed)
Cardiology Office Note   Date:  05/05/2018   ID:  Julian Ross, DOB [birth year], MRN [id number]  PCP:  Julian Bowen, MD  Cardiologist:   Julian Latch, MD  CT Surgeon: Julian Ross  Chief Complaint  Patient presents with  . Follow-up     History of Present Illness: Julian Ross is a 72 y.o. male with chronic systolic and diastolic heart failure LVEF improved from 30-35% to 50-55%, paroxysmal atrial fibrillation, CAD s/p multiple PCIs, who presents for follow up.  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  During that hospitalization he also had elevated troponin consistent with demand ischemia (troponin 0.13) and new onset atrial fibrillation with RVR.  He was diuresed to a discharge weight of 196 lb (from 208 lb on admit).  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  He previously had 16 stents placed in [place], [place].  His cardiologist there is Dr. Alroy Ross.  The inpatient team contacted Dr. Alroy Ross, who informed them that Julian Ross has a tight RCA lesion but no recent interventions.  He felt that Julian Ross could transition from Plavix to aspirin.  He has been intolerant to statins in the past.    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  After cardioversion he developed bradycardia and initially metoprolol was reduced.  Then he was switched from metoprolol to carvedilol.  This was further reduced to 6.25mg  bid on 06/19/16.  On 7/21 he had a recurrent episode of syncope. Hydralazine and isosorbide were discontinued.  Troponin was mildly elevated at 0.03.  CT of the head was negative, and chest x-ray showed no acute edema or infiltrate.  During that hospitalization he was noted to be orthostatic.  His SBP dropped to 80 when standing.  Carvedilol was reduced 1.5625 mg bid and his orthostasis was improved.  Echo that admission showed an improvement in his LVEF to 50-55%. He wore an  event monitor 07/31/16 that revealed short runs of atrial fibrillation as well as PVCs and PACs. He reported angina and underwent LHC  10/03/16 where he was noted to have severe, diffuse, three-vessel coronary disease with multiple areas of prior stenting. A recommendation was made for intensification of medical therapy or consideration of coronary artery bypass grafting. He was scheduled for CABG/MAZE but developed pneumonia requiring hospitalization 10/2016.  He had significant clinical improvement and a decision made was made to continue medical management unless his symptoms worsened.  He started on Repatha 03/2017.  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  His weight was up to 232 pounds.  Lasix was increased to 80 mg twice daily and low-dose Isordil was added to his regimen.  He had a repeat echocardiogram 11/04/17 that revealed LVEF 45-50% with inferolateral and basal to mid inferior walls.  For the last week he has been feeling poorly.  He has some indigestion when he walks.  It seems different than his prior chest pain that required stenting.  However it is associated with shortness of breath.  He has no nausea or diaphoresis.  It started 1 week ago and now occurs pretty much every time he walks.  It lasts for several minutes at a time and improves with rest.  He has not noted any lower extremity edema, orthopnea, or PND.  He notes that he is under more stress lately.  His wife underwent knee surgery and his son was in a car accident this  week.  His son is doing okay.   Past Medical History:  Diagnosis Date  . Anemia   . Anxiety state   . CAD (coronary artery disease)    16 prior stents (at [place])  . Cardiomyopathy, ischemic   . Chronic lower back pain   . Chronic systolic CHF (congestive heart failure) ([place])   . CKD (chronic kidney disease), stage III ([place])   . Depression   . Diabetes mellitus type 2 in obese ([place])   . Fibromyalgia   . Hepatitis A    at age 48  . High  cholesterol   . History of blood transfusion    "related to OR"  . Hypertension   . Hypokalemia   . MI (myocardial infarction) ([place]) 1990; 1995; 1997  . OSA on CPAP    setting = 13, full face mask  . Osteoarthritis   . Persistent atrial fibrillation ([place]) 02/2016   on Eliquis  . Rheumatoid arthritis of multiple sites with negative rheumatoid factor ([place])   . Septic shock ([place]) 02/2016   in setting of severe pneumonia    Past Surgical History:  Procedure Laterality Date  . BACK SURGERY    . CARDIAC CATHETERIZATION N/A 10/03/2016   Procedure: Left Heart Cath and Coronary Angiography;  Surgeon: Julian Crome, MD;  Location: [facility];  Service: Cardiovascular;  Laterality: N/A;  . CARDIOVERSION N/A 06/04/2016   Procedure: CARDIOVERSION;  Surgeon: Julian Latch, MD;  Location: [facility];  Service: Cardiovascular;  Laterality: N/A;  . CATARACT EXTRACTION W/ INTRAOCULAR LENS  IMPLANT, BILATERAL Bilateral 2000s  . CORONARY ANGIOPLASTY  early 55s  . CORONARY ANGIOPLASTY WITH STENT PLACEMENT     "I've got 15 stents" (07/19/2016)  . KNEE ARTHROSCOPY Left 1990s  . LEFT HEART CATH AND CORONARY ANGIOGRAPHY N/A 11/15/2017   Procedure: LEFT HEART CATH AND CORONARY ANGIOGRAPHY;  Surgeon: Julian Booze, MD;  Location: [facility];  Service: Cardiovascular;  Laterality: N/A;  . POSTERIOR LUMBAR FUSION  2015   L4-5-S1  . right knee surgery     age of 19  . TONSILLECTOMY AND ADENOIDECTOMY  1949     Current Outpatient Medications  Medication Sig Dispense Refill  . acetaminophen (TYLENOL) 325 MG tablet Take 650 mg by mouth every 4 (four) hours as needed for mild pain or headache.    . ALPRAZolam (NIRAVAM) 0.5 MG dissolvable tablet Take 1 tablet by mouth as needed (Take as directed).   2  . apixaban (ELIQUIS) 5 MG TABS tablet Take 1 tablet (5 mg total) by mouth 2 (two) times daily. 60 tablet   . aspirin EC 81 MG EC tablet Take 1 tablet (81 mg total) daily by mouth.      . carvedilol (COREG) 12.5 MG tablet Take 1 tablet (12.5 mg total) by mouth 2 (two) times daily with a meal. 180 tablet 1  . clopidogrel (PLAVIX) 75 MG tablet Take 1 tablet (75 mg total) by mouth daily. 90 tablet 1  . clotrimazole (LOTRIMIN) 1 % cream Apply 1 application topically 2 (two) times daily. (Patient taking differently: Apply 1 application 2 (two) times daily as needed topically (dry skin). ) 30 g 0  . Evolocumab (REPATHA SURECLICK) 093 MG/ML SOAJ Inject 140 mg into the skin every 14 (fourteen) days. 2 pen 11  . fenofibrate (TRICOR) 145 MG tablet Take 145 mg by mouth daily.    . folic acid (FOLVITE) 1 MG tablet Take 2 mg by mouth daily.     Marland Kitchen  furosemide (LASIX) 80 MG tablet Take 1 tablet (80 mg total) by mouth 2 (two) times daily. (Patient taking differently: Take 100 mg 2 (two) times daily by mouth. ) 180 tablet 3  . gabapentin (NEURONTIN) 300 MG capsule Take 300 mg by mouth at bedtime.   2  . hydrocortisone (ANUSOL-HC) 2.5 % rectal cream Place 1 application rectally 2 (two) times daily as needed for hemorrhoids or itching. 30 g 0  . insulin lispro protamine-lispro (HUMALOG 50/50 MIX) (50-50) 100 UNIT/ML SUSP injection Inject 40-50 Units into the skin See admin instructions. Takes 50 units in am, 40 units midday, 50 units in pm    . isosorbide mononitrate (IMDUR) 60 MG 24 hr tablet Take 1 tablet (60 mg total) daily by mouth. 60 tablet 2  . leucovorin (WELLCOVORIN) 10 MG tablet Take 10 mg by mouth See admin instructions. Take 1 tablet (10 mg) by mouth every 12 hours and 24 hours after the methotrexate dose    . LUMIGAN 0.01 % SOLN Place 1 drop into both eyes at bedtime.   0  . magnesium oxide (MAG-OX) 400 (241.3 Mg) MG tablet Take 1 tablet (400 mg total) by mouth 2 (two) times daily. 60 tablet 0  . Methotrexate Sodium (METHOTREXATE, PF,) 50 MG/2ML injection Inject 15 mg into the muscle every Friday.   0  . Misc Natural Products (OSTEO BI-FLEX ADV DOUBLE ST PO) Take 1 tablet by mouth daily  as needed (takes occassionally).     . nitroGLYCERIN (NITROSTAT) 0.4 MG SL tablet PLACE 1 TABLET UNDER THE TONGUE IF NEEDED 25 tablet 0  . omega-3 acid ethyl esters (LOVAZA) 1 g capsule Take 1 capsule (1 g total) by mouth 2 (two) times daily. 60 capsule 10  . pantoprazole (PROTONIX) 40 MG tablet Take 1 tablet (40 mg total) by mouth daily. 90 tablet 1  . PARoxetine (PAXIL) 40 MG tablet Take 40 mg by mouth daily.   11  . potassium chloride SA (K-DUR,KLOR-CON) 20 MEQ tablet TAKE 2 TABLETS BY MOUTH TWICE DAILY 120 tablet 3  . predniSONE (DELTASONE) 5 MG tablet Take 5 mg by mouth daily with breakfast.    . ranolazine (RANEXA) 1000 MG SR tablet Take 1 tablet (1,000 mg total) by mouth 2 (two) times daily. 60 tablet 5  . sulfaSALAzine (AZULFIDINE) 500 MG tablet Take 500 mg by mouth daily.    . tamsulosin (FLOMAX) 0.4 MG CAPS capsule Take 0.4 mg by mouth daily.   4  . tiZANidine (ZANAFLEX) 4 MG tablet Take 1 tablet by mouth every 8 (eight) hours as needed for muscle spasms.   2  . topiramate (TOPAMAX) 25 MG tablet Take 25 mg by mouth 2 (two) times daily.    . traMADol (ULTRAM) 50 MG tablet Take 1 tablet by mouth 3 (three) times daily as needed (pain). Take as directed  2  . VITAMIN D, ERGOCALCIFEROL, PO Take 5,000 Units by mouth daily.     Marland Kitchen ZETIA 10 MG tablet Take 10 mg by mouth daily.   11   No current facility-administered medications for this visit.     Allergies:   Statins; Ace inhibitors; Contrast media [iodinated diagnostic agents]; and Penicillins    Social History:  The patient  reports that he has never smoked. He has never used smokeless tobacco. He reports that he does not drink alcohol or use drugs.   Family History:  The patient's family history includes Heart attack in his brother and father; Heart failure in his mother;  Hyperlipidemia in his mother; Hypertension in his mother.    ROS:  Please see the history of present illness.   Otherwise, review of systems are positive for sinus  congestion.   All other systems are reviewed and negative.    PHYSICAL EXAM: VS:  BP 123/71   Pulse 87   Ht 5\' 11"  (1.803 m)   Wt 203 lb 3.2 oz (92.2 kg)   BMI 28.34 kg/m  , BMI Body mass index is 28.34 kg/m. GENERAL:  Chronically ill-appearing HEENT: Pupils equal round and reactive, fundi not visualized, oral mucosa unremarkable.  Poor dentition.  NECK:  No jugular venous distention, waveform within normal limits, carotid upstroke brisk and symmetric, no bruits LUNGS:  Clear to auscultation bilaterally HEART:  Irregularly irregular.  PMI not displaced or sustained,S1 and S2 within normal limits, no S3, no S4, no clicks, no rubs, no murmurs ABD:  Flat, positive bowel sounds normal in frequency in pitch, no bruits, no rebound, no guarding, no midline pulsatile mass, no hepatomegaly, no splenomegaly EXT:  2 plus pulses throughout, no edema, no cyanosis no clubbing SKIN:  No rashes no nodules NEURO:  Cranial nerves II through XII grossly intact, motor grossly intact throughout PSYCH:  Cognitively intact, oriented to person place and time   EKG:  EKG is ordered today. The ekg ordered 05/22/16 demonstrates atrial fibrillation rate 68 bpm.   09/28/16: Atrial fibrillation rate 84 bpm.  Non-specific ST changes.  QT prolongation.  11/13/17: Atrial fibrillation.  Rate 88 bpm.  Prior inferior infarct. 05/05/18: Atrial fibrillation.  Rate 87 bpm.  Prior inferior infarct.    Echo 07/20/16: Study Conclusions  - Left ventricle: The cavity size was normal. Wall thickness was   increased in a pattern of mild LVH. Systolic function was normal.   The estimated ejection fraction was in the range of 50% to 55%.   There is akinesis of the basalinferior myocardium. There is   akinesis of the inferolateral myocardium. Features are consistent   with a pseudonormal left ventricular filling pattern, with   concomitant abnormal relaxation and increased filling pressure   (grade 2 diastolic dysfunction).  Doppler parameters are   consistent with high ventricular filling pressure. - Left atrium: The atrium was mildly dilated. - Right atrium: The atrium was mildly dilated.  Impressions:  - Akinesis of the basal inferior wall and inferior lateral wall;   overall low normal LV systolic function; grade 2 diastolic   dysfunction with elevated LV filling pressure; mild biatrial   enlargment; trace MR and TR.  Echo 11/04/17: LVEF 45-50%.  Wall motion unchanged from 07/20/16.  PASP 39 mmHg.   LHC 10/03/16: Ost Cx to Dist Cx lesion, 65 %stenosed.  2nd Mrg lesion, 75 %stenosed.  Dist Cx lesion, 80 %stenosed.  Dist LAD lesion, 90 %stenosed.  Mid LAD lesion, 80 %stenosed.  Prox LAD to Mid LAD lesion, 70 %stenosed.   1st Diag lesion, 75 %stenosed.  1st RPLB lesion, 100 %stenosed.  Prox RCA to Dist RCA lesion, 100 %stenosed.  LV end diastolic pressure is mildly elevated.    Severe diffuse three-vessel coronary disease in this patient with multiple prior stents.  Total occlusion of the right coronary within the ostial segment. Distal vessel fills by collaterals from the left coronary. The distal right coronary is small and may not be graftable.  Severe diffuse LAD disease with 70% proximal stenosis, 80% mid stenosis, and 90% apical stenosis. The first diagonal which is relatively small contains segmental 70-80% stenosis.  Heavily stented proximal to mid circumflex coronary artery with 50-70% in-stent restenosis, diffuse. Large second obtuse marginal with 75% proximal diffuse narrowing. 70-80% segmental narrowing in the mid circumflex after the origin of the second obtuse marginal.  Mildly elevated LV EDP. Recent echo demonstrating EF greater than 50%. Contrast LV gram not performed due to chronic kidney disease. Total contrast used was 80 cc.  Recent Labs: 10/30/2017: BNP 229.8 11/14/2017: Magnesium 2.4; TSH 1.917 11/17/2017: BUN 41; Creatinine, Ser 2.29; Hemoglobin 12.0; Platelets  268; Potassium 3.9; Sodium 135    Lipid Panel    Component Value Date/Time   CHOL 101 11/14/2017 0617   TRIG 201 (H) 11/14/2017 0617   HDL 43 11/14/2017 0617   CHOLHDL 2.3 11/14/2017 0617   VLDL 40 11/14/2017 0617   LDLCALC 18 11/14/2017 0617      Wt Readings from Last 3 Encounters:  05/05/18 203 lb 3.2 oz (92.2 kg)  11/28/17 224 lb (101.6 kg)  11/16/17 214 lb 8.1 oz (97.3 kg)      ASSESSMENT AND PLAN:  CMP, lipids, BNP, troponin  # Epigastric/chest discomfort:  # CAD s/p PCI: # CCS Class III angina:  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 He has severe 3 vessel CAD.  If medical management fails he will need CAGB.  He has been doing fairly well and CABG was deferred 2/2 frailty.  We will try to increase Repatha to 1000mg  bid.  Continue aspirin, clopidogrel, carvedilol, Repatha, and isosorbide mononitrate.  We will also check a troponin.  # Chronic systolic and diastolic heart failure:   LVEF slightly declined to 45-50% from 50-55% previously.  Volume status looks good.  He is euvolemic.  We will check a BNP to be sure.  Continue carvedilol, Lasix, and Imdur.  He is not on an ACE-I/ARB 2/2 renal failure.   # Hypertension/hypotension: BP well-controlled.  Continue carvedilol.  # Atrial fibrillation: He remains in atrial fibrillation after cardioversion but rates are well-controlled.  Continue Eliquis and carvedilol.  His patients CHA2DS2-VASc Score and unadjusted Ischemic Stroke Rate (% per year) is equal to 7.2 % stroke rate/year from a score of 5  Above score calculated as 1 point each if present [CHF, HTN, DM, Vascular=MI/PAD/Aortic Plaque, Age if 65-74, or Male] Above score calculated as 2 points each if present [Age > 75, or Stroke/TIA/TE]    Current medicines are reviewed at length with the patient today.  The patient does not have concerns regarding medicines.  The following changes have been made:increase Repatha to 1000 mg bid.   Labs/  tests ordered today include:   Orders Placed This Encounter  Procedures  . Lipid panel  . Comprehensive metabolic panel  . Pro b natriuretic peptide (BNP)  . Troponin I  . EKG 12-Lead     Disposition:   FU with Julian Steinhauser C. [name], MD, [facility] in 1 week.      Signed, Kleo Dungee C. [name], MD, [facility]  05/05/2018 12:57 PM    [facility]

## 2018-05-05 NOTE — Patient Instructions (Addendum)
Medication Instructions:  INCREASE YOUR RANEXA TO 1000 MG TWICE A DAY  Labwork: TROPONIN/CMET/BNP/LP TODAY   Testing/Procedures: NONE  Follow-Up: Your physician recommends that you schedule a follow-up appointment in: MAY 17 AT 2:20 WITH DR [facility]   Your physician recommends that you schedule a follow-up appointment in: 2-3 MONTHS WITH DR [facility], [place]

## 2018-05-06 ENCOUNTER — Telehealth: Payer: Self-pay | Admitting: *Deleted

## 2018-05-06 ENCOUNTER — Telehealth: Payer: Self-pay

## 2018-05-06 NOTE — Telephone Encounter (Signed)
-----   Message from Skeet Latch, MD sent at 05/05/2018  5:34 PM EDT ----- Patient called.  Troponin mildly elevated.  He is not a candidate for CABG and has complex CAD.  Currently chest pain free.  Increase ranolazine as planned.  He will go to the ED if symptoms worsen.  Increase potassium to 40mg  tid.

## 2018-05-06 NOTE — Telephone Encounter (Signed)
OK great. Thank you!

## 2018-05-06 NOTE — Telephone Encounter (Signed)
Spoke with patient and he has not been taking his potassium twice a day as directed. He will resume as prescribed. Patient did want for Dr [name] to know he is feeling better since increasing his Ranexz. Will forward so she will be aware

## 2018-05-06 NOTE — Telephone Encounter (Signed)
Received call from [place] for a critical troponin of 0.08. MD has already been aware and spoken with pt. Duplicated call.

## 2018-05-08 ENCOUNTER — Other Ambulatory Visit: Payer: Self-pay | Admitting: Cardiology

## 2018-05-08 NOTE — Telephone Encounter (Signed)
Rx has been sent to the pharmacy electronically. ° °

## 2018-05-14 ENCOUNTER — Encounter: Payer: Self-pay | Admitting: Cardiovascular Disease

## 2018-05-14 ENCOUNTER — Telehealth: Payer: Self-pay | Admitting: Cardiovascular Disease

## 2018-05-14 MED ORDER — NITROGLYCERIN 0.4 MG SL SUBL: SUBLINGUAL_TABLET | SUBLINGUAL | 11 refills | Status: DC

## 2018-05-14 NOTE — Telephone Encounter (Signed)
New Message     *STAT* If patient is at the pharmacy, call can be transferred to refill team.   1. Which medications need to be refilled? (please list name of each medication and dose if known)   nitroGLYCERIN (NITROSTAT) 0.4 MG SL tablet       2. Which pharmacy/location (including street and city if local pharmacy) is medication to be sent to? [place]   3. Do they need a 30 day or 90 day supply? [place]

## 2018-05-16 ENCOUNTER — Encounter: Payer: Self-pay | Admitting: Cardiovascular Disease

## 2018-05-16 ENCOUNTER — Ambulatory Visit (INDEPENDENT_AMBULATORY_CARE_PROVIDER_SITE_OTHER): Payer: Medicare Other | Admitting: Cardiovascular Disease

## 2018-05-16 VITALS — BP 122/68 | HR 89 | Ht 71.0 in | Wt 202.2 lb

## 2018-05-16 DIAGNOSIS — E7800 Pure hypercholesterolemia, unspecified: Secondary | ICD-10-CM | POA: Diagnosis not present

## 2018-05-16 DIAGNOSIS — I4819 Other persistent atrial fibrillation: Secondary | ICD-10-CM

## 2018-05-16 DIAGNOSIS — I481 Persistent atrial fibrillation: Secondary | ICD-10-CM | POA: Diagnosis not present

## 2018-05-16 DIAGNOSIS — R079 Chest pain, unspecified: Secondary | ICD-10-CM

## 2018-05-16 DIAGNOSIS — I200 Unstable angina: Secondary | ICD-10-CM

## 2018-05-16 DIAGNOSIS — I5042 Chronic combined systolic (congestive) and diastolic (congestive) heart failure: Secondary | ICD-10-CM | POA: Diagnosis not present

## 2018-05-16 DIAGNOSIS — I739 Peripheral vascular disease, unspecified: Secondary | ICD-10-CM

## 2018-05-16 DIAGNOSIS — I10 Essential (primary) hypertension: Secondary | ICD-10-CM

## 2018-05-16 DIAGNOSIS — I2510 Atherosclerotic heart disease of native coronary artery without angina pectoris: Secondary | ICD-10-CM | POA: Diagnosis not present

## 2018-05-16 DIAGNOSIS — R0989 Other specified symptoms and signs involving the circulatory and respiratory systems: Secondary | ICD-10-CM

## 2018-05-16 DIAGNOSIS — Z7901 Long term (current) use of anticoagulants: Secondary | ICD-10-CM

## 2018-05-16 DIAGNOSIS — Z9861 Coronary angioplasty status: Secondary | ICD-10-CM | POA: Diagnosis not present

## 2018-05-16 NOTE — Progress Notes (Signed)
Cardiology Office Note   Date:  05/16/2018   ID:  Julian Ross, DOB Mar 31, 1946, MRN [id number]  PCP:  Reynold Bowen, MD  Cardiologist:   Skeet Latch, MD  CT Surgeon: Dr. [name]  Chief Complaint  Patient presents with  . Follow-up     History of Present Illness: Julian Ross is a 72 y.o. male with chronic systolic and diastolic heart failure LVEF improved from 30-35% to 50-55%, paroxysmal atrial fibrillation, CAD s/p multiple PCIs, who presents for follow up.  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  During that hospitalization he also had elevated troponin consistent with demand ischemia (troponin 0.13) and new onset atrial fibrillation with RVR.  He was diuresed to a discharge weight of 196 lb (from 208 lb on admit).  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  He previously had 16 stents placed in [place], [place].  His cardiologist there is [name].  The inpatient team contacted [name], who informed them that Mr. Codrington has a tight RCA lesion but no recent interventions.  He felt that Mr. Rushlow could transition from Plavix to aspirin.  He has been intolerant to statins in the past.    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  After cardioversion he developed bradycardia and initially metoprolol was reduced.  Then he was switched from metoprolol to carvedilol.  This was further reduced to 6.25mg  bid on 06/19/16.  On 7/21 he had a recurrent episode of syncope. Hydralazine and isosorbide were discontinued.  Troponin was mildly elevated at 0.03.  CT of the head was negative, and chest x-ray showed no acute edema or infiltrate.  During that hospitalization he was noted to be orthostatic.  His SBP dropped to 80 when standing.  Carvedilol was reduced 1.5625 mg bid and his orthostasis was improved.  Echo that admission showed an improvement in his LVEF to 50-55%. He wore an  event monitor 07/31/16 that revealed short runs of atrial fibrillation as well as PVCs and PACs. He reported angina and underwent LHC  10/03/16 where he was noted to have severe, diffuse, three-vessel coronary disease with multiple areas of prior stenting. A recommendation was made for intensification of medical therapy or consideration of coronary artery bypass grafting. He was scheduled for CABG/MAZE but developed pneumonia requiring hospitalization 10/2016.  He had significant clinical improvement and a decision made was made to continue medical management unless his symptoms worsened.  He started on Repatha 03/2017.  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  His weight was up to 232 pounds.  Lasix was increased to 80 mg twice daily and low-dose Isordil was added to his regimen.  He had a repeat echocardiogram 11/04/17 that revealed LVEF 45-50% with inferolateral and basal to mid inferior walls.  At his last appointment he was euvolemic but reported increased chest pain.  Ranexa was increased to 1000 mg twice daily.  He initially still had chest pain for the first few days.  However since that time it is been much better controlled.  His breathing has been stable.  He has no lower extremity edema, orthopnea, or PND.  He hasn't experienced palpitations.  He does note pain in his legs that is worse with walking.  He is unsure if this was arterial disease or due to neuropathy.  Past Medical History:  Diagnosis Date  . Anemia   . Anxiety state   . CAD (  coronary artery disease)    16 prior stents (at [facility])  . Cardiomyopathy, ischemic   . Chronic lower back pain   . Chronic systolic CHF (congestive heart failure) ([place])   . CKD (chronic kidney disease), stage III ([place])   . Depression   . Diabetes mellitus type 2 in obese ([place])   . Fibromyalgia   . Hepatitis A    at age 29  . High cholesterol   . History of blood transfusion    "related to OR"  . Hypertension   . Hypokalemia   . MI  (myocardial infarction) ([place]) 1990; 1995; 1997  . OSA on CPAP    setting = 13, full face mask  . Osteoarthritis   . Persistent atrial fibrillation ([place]) 02/2016   on Eliquis  . Rheumatoid arthritis of multiple sites with negative rheumatoid factor ([place])   . Septic shock ([place]) 02/2016   in setting of severe pneumonia    Past Surgical History:  Procedure Laterality Date  . BACK SURGERY    . CARDIAC CATHETERIZATION N/A 10/03/2016   Procedure: Left Heart Cath and Coronary Angiography;  Surgeon: Belva Crome, MD;  Location: [facility];  Service: Cardiovascular;  Laterality: N/A;  . CARDIOVERSION N/A 06/04/2016   Procedure: CARDIOVERSION;  Surgeon: Skeet Latch, MD;  Location: [facility];  Service: Cardiovascular;  Laterality: N/A;  . CATARACT EXTRACTION W/ INTRAOCULAR LENS  IMPLANT, BILATERAL Bilateral 2000s  . CORONARY ANGIOPLASTY  early 76s  . CORONARY ANGIOPLASTY WITH STENT PLACEMENT     "I've got 15 stents" (07/19/2016)  . KNEE ARTHROSCOPY Left 1990s  . LEFT HEART CATH AND CORONARY ANGIOGRAPHY N/A 11/15/2017   Procedure: LEFT HEART CATH AND CORONARY ANGIOGRAPHY;  Surgeon: Jettie Booze, MD;  Location: [facility];  Service: Cardiovascular;  Laterality: N/A;  . POSTERIOR LUMBAR FUSION  2015   L4-5-S1  . right knee surgery     age of 48  . TONSILLECTOMY AND ADENOIDECTOMY  1949     Current Outpatient Medications  Medication Sig Dispense Refill  . acetaminophen (TYLENOL) 325 MG tablet Take 650 mg by mouth every 4 (four) hours as needed for mild pain or headache.    . ALPRAZolam (NIRAVAM) 0.5 MG dissolvable tablet Take 1 tablet by mouth as needed (Take as directed).   2  . apixaban (ELIQUIS) 5 MG TABS tablet Take 1 tablet (5 mg total) by mouth 2 (two) times daily. 60 tablet   . aspirin EC 81 MG EC tablet Take 1 tablet (81 mg total) daily by mouth.    . carvedilol (COREG) 12.5 MG tablet Take 1 tablet (12.5 mg total) by mouth 2 (two) times daily with a meal.  180 tablet 1  . clopidogrel (PLAVIX) 75 MG tablet Take 1 tablet (75 mg total) by mouth daily. 90 tablet 1  . clotrimazole (LOTRIMIN) 1 % cream Apply 1 application topically 2 (two) times daily. (Patient taking differently: Apply 1 application 2 (two) times daily as needed topically (dry skin). ) 30 g 0  . Evolocumab (REPATHA SURECLICK) 419 MG/ML SOAJ Inject 140 mg into the skin every 14 (fourteen) days. 2 pen 11  . fenofibrate (TRICOR) 145 MG tablet Take 145 mg by mouth daily.    . folic acid (FOLVITE) 1 MG tablet Take 2 mg by mouth daily.     . furosemide (LASIX) 80 MG tablet Take 1 tablet (80 mg total) by mouth 2 (two) times daily. (Patient taking differently: Take 100 mg  2 (two) times daily by mouth. ) 180 tablet 3  . gabapentin (NEURONTIN) 300 MG capsule Take 300 mg by mouth at bedtime.   2  . hydrocortisone (ANUSOL-HC) 2.5 % rectal cream Place 1 application rectally 2 (two) times daily as needed for hemorrhoids or itching. 30 g 0  . insulin lispro protamine-lispro (HUMALOG 50/50 MIX) (50-50) 100 UNIT/ML SUSP injection Inject 40-50 Units into the skin See admin instructions. Takes 50 units in am, 40 units midday, 50 units in pm    . isosorbide mononitrate (IMDUR) 60 MG 24 hr tablet Take 1 tablet (60 mg total) daily by mouth. 60 tablet 2  . leucovorin (WELLCOVORIN) 10 MG tablet Take 10 mg by mouth See admin instructions. Take 1 tablet (10 mg) by mouth every 12 hours and 24 hours after the methotrexate dose    . LUMIGAN 0.01 % SOLN Place 1 drop into both eyes at bedtime.   0  . magnesium oxide (MAG-OX) 400 (241.3 Mg) MG tablet Take 1 tablet (400 mg total) by mouth 2 (two) times daily. 60 tablet 0  . Methotrexate Sodium (METHOTREXATE, PF,) 50 MG/2ML injection Inject 15 mg into the muscle every Friday.   0  . Misc Natural Products (OSTEO BI-FLEX ADV DOUBLE ST PO) Take 1 tablet by mouth daily as needed (takes occassionally).     . nitroGLYCERIN (NITROSTAT) 0.4 MG SL tablet PLACE 1 TABLET UNDER THE  TONGUE IF NEEDED 25 tablet 11  . omega-3 acid ethyl esters (LOVAZA) 1 g capsule Take 1 capsule (1 g total) by mouth 2 (two) times daily. 60 capsule 10  . pantoprazole (PROTONIX) 40 MG tablet TAKE 1 TABLET(40 MG) BY MOUTH DAILY 90 tablet 0  . PARoxetine (PAXIL) 40 MG tablet Take 40 mg by mouth daily.   11  . potassium chloride SA (K-DUR,KLOR-CON) 20 MEQ tablet TAKE 2 TABLETS BY MOUTH TWICE DAILY 120 tablet 3  . predniSONE (DELTASONE) 5 MG tablet Take 5 mg by mouth daily with breakfast.    . ranolazine (RANEXA) 1000 MG SR tablet Take 1 tablet (1,000 mg total) by mouth 2 (two) times daily. 60 tablet 5  . tamsulosin (FLOMAX) 0.4 MG CAPS capsule Take 0.4 mg by mouth daily.   4  . tiZANidine (ZANAFLEX) 4 MG tablet Take 1 tablet by mouth every 8 (eight) hours as needed for muscle spasms.   2  . topiramate (TOPAMAX) 25 MG tablet Take 25 mg by mouth 2 (two) times daily.    . traMADol (ULTRAM) 50 MG tablet Take 1 tablet by mouth 3 (three) times daily as needed (pain). Take as directed  2  . VITAMIN D, ERGOCALCIFEROL, PO Take 5,000 Units by mouth daily.     Marland Kitchen ZETIA 10 MG tablet Take 10 mg by mouth daily.   11   No current facility-administered medications for this visit.     Allergies:   Statins; Ace inhibitors; Contrast media [iodinated diagnostic agents]; and Penicillins    Social History:  The patient  reports that he has never smoked. He has never used smokeless tobacco. He reports that he does not drink alcohol or use drugs.   Family History:  The patient's family history includes Heart attack in his brother and father; Heart failure in his mother; Hyperlipidemia in his mother; Hypertension in his mother.    ROS:  Please see the history of present illness.   Otherwise, review of systems are positive for sinus congestion.   All other systems are reviewed and negative.  PHYSICAL EXAM: VS:  BP 122/68   Pulse 89   Ht 5\' 11"  (1.803 m)   Wt 202 lb 3.2 oz (91.7 kg)   BMI 28.20 kg/m  , BMI Body  mass index is 28.2 kg/m. GENERAL:  Chronically ill-appearing HEENT: Pupils equal round and reactive, fundi not visualized, oral mucosa unremarkable NECK:  No jugular venous distention, waveform within normal limits, carotid upstroke brisk and symmetric, no bruits, no thyromegaly LYMPHATICS:  No cervical adenopathy LUNGS:  Clear to auscultation bilaterally HEART:  Irregularly irregular.  PMI not displaced or sustained,S1 and S2 within normal limits, no S3, no S4, no clicks, no rubs, no murmurs ABD:  Flat, positive bowel sounds normal in frequency in pitch, no bruits, no rebound, no guarding, no midline pulsatile mass, no hepatomegaly, no splenomegaly EXT:  Unable to palpate DP/PT pulses, no edema, no cyanosis no clubbing SKIN:  No rashes no nodules NEURO:  Cranial nerves II through XII grossly intact, motor grossly intact throughout PSYCH:  Cognitively intact, oriented to person place and time   EKG:  EKG is not ordered today. The ekg ordered 05/22/16 demonstrates atrial fibrillation rate 68 bpm.   09/28/16: Atrial fibrillation rate 84 bpm.  Non-specific ST changes.  QT prolongation.  11/13/17: Atrial fibrillation.  Rate 88 bpm.  Prior inferior infarct. 05/05/18: Atrial fibrillation.  Rate 87 bpm.  Prior inferior infarct.    Echo 07/20/16: Study Conclusions  - Left ventricle: The cavity size was normal. Wall thickness was   increased in a pattern of mild LVH. Systolic function was normal.   The estimated ejection fraction was in the range of 50% to 55%.   There is akinesis of the basalinferior myocardium. There is   akinesis of the inferolateral myocardium. Features are consistent   with a pseudonormal left ventricular filling pattern, with   concomitant abnormal relaxation and increased filling pressure   (grade 2 diastolic dysfunction). Doppler parameters are   consistent with high ventricular filling pressure. - Left atrium: The atrium was mildly dilated. - Right atrium: The atrium  was mildly dilated.  Impressions:  - Akinesis of the basal inferior wall and inferior lateral wall;   overall low normal LV systolic function; grade 2 diastolic   dysfunction with elevated LV filling pressure; mild biatrial   enlargment; trace MR and TR.  Echo 11/04/17: LVEF 45-50%.  Wall motion unchanged from 07/20/16.  PASP 39 mmHg.   LHC 10/03/16: Ost Cx to Dist Cx lesion, 65 %stenosed.  2nd Mrg lesion, 75 %stenosed.  Dist Cx lesion, 80 %stenosed.  Dist LAD lesion, 90 %stenosed.  Mid LAD lesion, 80 %stenosed.  Prox LAD to Mid LAD lesion, 70 %stenosed.   1st Diag lesion, 75 %stenosed.  1st RPLB lesion, 100 %stenosed.  Prox RCA to Dist RCA lesion, 100 %stenosed.  LV end diastolic pressure is mildly elevated.    Severe diffuse three-vessel coronary disease in this patient with multiple prior stents.  Total occlusion of the right coronary within the ostial segment. Distal vessel fills by collaterals from the left coronary. The distal right coronary is small and may not be graftable.  Severe diffuse LAD disease with 70% proximal stenosis, 80% mid stenosis, and 90% apical stenosis. The first diagonal which is relatively small contains segmental 70-80% stenosis.  Heavily stented proximal to mid circumflex coronary artery with 50-70% in-stent restenosis, diffuse. Large second obtuse marginal with 75% proximal diffuse narrowing. 70-80% segmental narrowing in the mid circumflex after the origin of the second obtuse marginal.  Mildly elevated LV EDP. Recent echo demonstrating EF greater than 50%. Contrast LV gram not performed due to chronic kidney disease. Total contrast used was 80 cc.  Recent Labs: 10/30/2017: BNP 229.8 11/14/2017: Magnesium 2.4; TSH 1.917 11/17/2017: Hemoglobin 12.0; Platelets 268 05/05/2018: ALT 14; BUN 42; Creatinine, Ser 2.26; NT-Pro BNP 622; Potassium 3.1; Sodium 133    Lipid Panel    Component Value Date/Time   CHOL 167 05/05/2018 0939   TRIG 139  05/05/2018 0939   HDL 46 05/05/2018 0939   CHOLHDL 3.6 05/05/2018 0939   CHOLHDL 2.3 11/14/2017 0617   VLDL 40 11/14/2017 0617   LDLCALC 93 05/05/2018 0939      Wt Readings from Last 3 Encounters:  05/16/18 202 lb 3.2 oz (91.7 kg)  05/05/18 203 lb 3.2 oz (92.2 kg)  11/28/17 224 lb (101.6 kg)      ASSESSMENT AND PLAN:  # Claudication: Check ABIs.  Unable to palpate pedal pulses.  # CAD s/p PCI: # CCS Class III angina:  Chest pain improved with increasing Ranexa.  Continue Aspirin, clopidogrel, ezetimibe, fenofibrate, carvedilol, Imdur, Repatha, and Ranexa.    # Chronic systolic and diastolic heart failure:   LVEF slightly declined to 45-50% from 50-55% previously.  Volume status looks good.  Continue current meds as above.   # Hypertension/hypotension: BP well-controlled on repeat.   # Atrial fibrillation: He remains in atrial fibrillation after cardioversion but rates are well-controlled.  Continue Eliquis and carvedilol.  His patients CHA2DS2-VASc Score and unadjusted Ischemic Stroke Rate (% per year) is equal to 7.2 % stroke rate/year from a score of 5  Above score calculated as 1 point each if present [CHF, HTN, DM, Vascular=MI/PAD/Aortic Plaque, Age if 65-74, or Male] Above score calculated as 2 points each if present [Age > 75, or Stroke/TIA/TE]    Current medicines are reviewed at length with the patient today.  The patient does not have concerns regarding medicines.  The following changes have been made:increase none  Labs/ tests ordered today include:   No orders of the defined types were placed in this encounter.    Disposition:   FU with Karla Vines C. [name], MD, [facility] in 3 months.      Signed, Malin Cervini C. [name], MD, [facility]  05/16/2018 4:54 PM    [place]

## 2018-05-16 NOTE — Patient Instructions (Addendum)
Medication Instructions:  Your physician recommends that you continue on your current medications as directed. Please refer to the Current Medication list given to you today.  Labwork: NONE  Testing/Procedures: Your physician has requested that you have an ankle brachial index (ABI). During this test an ultrasound and blood pressure cuff are used to evaluate the arteries that supply the arms and legs with blood. Allow thirty minutes for this exam. There are no restrictions or special instructions.  Follow-Up: Your physician recommends that you schedule a follow-up appointment in: 3 MONTHS   If you need a refill on your cardiac medications before your next appointment, please call your pharmacy.

## 2018-05-28 ENCOUNTER — Telehealth: Payer: Self-pay | Admitting: Pharmacist

## 2018-05-28 ENCOUNTER — Emergency Department (HOSPITAL_COMMUNITY): Payer: Medicare Other

## 2018-05-28 ENCOUNTER — Inpatient Hospital Stay (HOSPITAL_COMMUNITY)
Admission: EM | Admit: 2018-05-28 | Discharge: 2018-06-02 | DRG: 682 | Disposition: A | Discharge status: Skilled Nursing Facility | Payer: Medicare Other | Attending: Internal Medicine | Admitting: Internal Medicine

## 2018-05-28 ENCOUNTER — Encounter (HOSPITAL_COMMUNITY): Payer: Self-pay

## 2018-05-28 ENCOUNTER — Other Ambulatory Visit: Payer: Self-pay

## 2018-05-28 DIAGNOSIS — R410 Disorientation, unspecified: Secondary | ICD-10-CM

## 2018-05-28 DIAGNOSIS — I371 Nonrheumatic pulmonary valve insufficiency: Secondary | ICD-10-CM | POA: Diagnosis not present

## 2018-05-28 DIAGNOSIS — I5042 Chronic combined systolic (congestive) and diastolic (congestive) heart failure: Secondary | ICD-10-CM | POA: Diagnosis present

## 2018-05-28 DIAGNOSIS — E7800 Pure hypercholesterolemia, unspecified: Secondary | ICD-10-CM | POA: Diagnosis present

## 2018-05-28 DIAGNOSIS — R400 Somnolence: Secondary | ICD-10-CM | POA: Diagnosis not present

## 2018-05-28 DIAGNOSIS — G8929 Other chronic pain: Secondary | ICD-10-CM | POA: Diagnosis present

## 2018-05-28 DIAGNOSIS — I255 Ischemic cardiomyopathy: Secondary | ICD-10-CM | POA: Diagnosis present

## 2018-05-28 DIAGNOSIS — E669 Obesity, unspecified: Secondary | ICD-10-CM | POA: Diagnosis present

## 2018-05-28 DIAGNOSIS — G9341 Metabolic encephalopathy: Secondary | ICD-10-CM | POA: Diagnosis present

## 2018-05-28 DIAGNOSIS — Z7952 Long term (current) use of systemic steroids: Secondary | ICD-10-CM

## 2018-05-28 DIAGNOSIS — M797 Fibromyalgia: Secondary | ICD-10-CM | POA: Diagnosis present

## 2018-05-28 DIAGNOSIS — N179 Acute kidney failure, unspecified: Secondary | ICD-10-CM

## 2018-05-28 DIAGNOSIS — E1110 Type 2 diabetes mellitus with ketoacidosis without coma: Secondary | ICD-10-CM | POA: Diagnosis present

## 2018-05-28 DIAGNOSIS — E668 Other obesity: Secondary | ICD-10-CM | POA: Diagnosis present

## 2018-05-28 DIAGNOSIS — M069 Rheumatoid arthritis, unspecified: Secondary | ICD-10-CM | POA: Diagnosis present

## 2018-05-28 DIAGNOSIS — Z7902 Long term (current) use of antithrombotics/antiplatelets: Secondary | ICD-10-CM

## 2018-05-28 DIAGNOSIS — Z961 Presence of intraocular lens: Secondary | ICD-10-CM | POA: Diagnosis present

## 2018-05-28 DIAGNOSIS — I252 Old myocardial infarction: Secondary | ICD-10-CM | POA: Diagnosis not present

## 2018-05-28 DIAGNOSIS — I480 Paroxysmal atrial fibrillation: Secondary | ICD-10-CM | POA: Diagnosis not present

## 2018-05-28 DIAGNOSIS — Z515 Encounter for palliative care: Secondary | ICD-10-CM | POA: Diagnosis not present

## 2018-05-28 DIAGNOSIS — I481 Persistent atrial fibrillation: Secondary | ICD-10-CM | POA: Diagnosis present

## 2018-05-28 DIAGNOSIS — F339 Major depressive disorder, recurrent, unspecified: Secondary | ICD-10-CM | POA: Diagnosis not present

## 2018-05-28 DIAGNOSIS — Z9841 Cataract extraction status, right eye: Secondary | ICD-10-CM

## 2018-05-28 DIAGNOSIS — M545 Low back pain: Secondary | ICD-10-CM | POA: Diagnosis present

## 2018-05-28 DIAGNOSIS — N183 Chronic kidney disease, stage 3 unspecified: Secondary | ICD-10-CM | POA: Diagnosis present

## 2018-05-28 DIAGNOSIS — R402441 Other coma, without documented Glasgow coma scale score, or with partial score reported, in the field [EMT or ambulance]: Secondary | ICD-10-CM | POA: Diagnosis not present

## 2018-05-28 DIAGNOSIS — Z9114 Patient's other noncompliance with medication regimen: Secondary | ICD-10-CM

## 2018-05-28 DIAGNOSIS — Z9842 Cataract extraction status, left eye: Secondary | ICD-10-CM

## 2018-05-28 DIAGNOSIS — E1165 Type 2 diabetes mellitus with hyperglycemia: Secondary | ICD-10-CM | POA: Diagnosis not present

## 2018-05-28 DIAGNOSIS — Z8739 Personal history of other diseases of the musculoskeletal system and connective tissue: Secondary | ICD-10-CM | POA: Diagnosis not present

## 2018-05-28 DIAGNOSIS — R2689 Other abnormalities of gait and mobility: Secondary | ICD-10-CM | POA: Diagnosis not present

## 2018-05-28 DIAGNOSIS — E785 Hyperlipidemia, unspecified: Secondary | ICD-10-CM | POA: Diagnosis present

## 2018-05-28 DIAGNOSIS — G4733 Obstructive sleep apnea (adult) (pediatric): Secondary | ICD-10-CM | POA: Diagnosis present

## 2018-05-28 DIAGNOSIS — Z66 Do not resuscitate: Secondary | ICD-10-CM | POA: Diagnosis present

## 2018-05-28 DIAGNOSIS — E875 Hyperkalemia: Secondary | ICD-10-CM | POA: Diagnosis present

## 2018-05-28 DIAGNOSIS — E1169 Type 2 diabetes mellitus with other specified complication: Secondary | ICD-10-CM | POA: Diagnosis present

## 2018-05-28 DIAGNOSIS — E1111 Type 2 diabetes mellitus with ketoacidosis with coma: Secondary | ICD-10-CM | POA: Diagnosis not present

## 2018-05-28 DIAGNOSIS — E7849 Other hyperlipidemia: Secondary | ICD-10-CM | POA: Diagnosis not present

## 2018-05-28 DIAGNOSIS — G4730 Sleep apnea, unspecified: Secondary | ICD-10-CM | POA: Diagnosis not present

## 2018-05-28 DIAGNOSIS — M06021 Rheumatoid arthritis without rheumatoid factor, right elbow: Secondary | ICD-10-CM | POA: Diagnosis not present

## 2018-05-28 DIAGNOSIS — I340 Nonrheumatic mitral (valve) insufficiency: Secondary | ICD-10-CM | POA: Diagnosis not present

## 2018-05-28 DIAGNOSIS — E860 Dehydration: Secondary | ICD-10-CM | POA: Diagnosis present

## 2018-05-28 DIAGNOSIS — E1122 Type 2 diabetes mellitus with diabetic chronic kidney disease: Secondary | ICD-10-CM | POA: Diagnosis present

## 2018-05-28 DIAGNOSIS — Z794 Long term (current) use of insulin: Secondary | ICD-10-CM

## 2018-05-28 DIAGNOSIS — Z8349 Family history of other endocrine, nutritional and metabolic diseases: Secondary | ICD-10-CM

## 2018-05-28 DIAGNOSIS — I509 Heart failure, unspecified: Secondary | ICD-10-CM | POA: Diagnosis not present

## 2018-05-28 DIAGNOSIS — I248 Other forms of acute ischemic heart disease: Secondary | ICD-10-CM | POA: Diagnosis not present

## 2018-05-28 DIAGNOSIS — D631 Anemia in chronic kidney disease: Secondary | ICD-10-CM | POA: Diagnosis not present

## 2018-05-28 DIAGNOSIS — R4182 Altered mental status, unspecified: Secondary | ICD-10-CM | POA: Diagnosis not present

## 2018-05-28 DIAGNOSIS — Z880 Allergy status to penicillin: Secondary | ICD-10-CM

## 2018-05-28 DIAGNOSIS — I2510 Atherosclerotic heart disease of native coronary artery without angina pectoris: Secondary | ICD-10-CM | POA: Diagnosis present

## 2018-05-28 DIAGNOSIS — I4891 Unspecified atrial fibrillation: Secondary | ICD-10-CM

## 2018-05-28 DIAGNOSIS — K219 Gastro-esophageal reflux disease without esophagitis: Secondary | ICD-10-CM | POA: Diagnosis present

## 2018-05-28 DIAGNOSIS — Z8249 Family history of ischemic heart disease and other diseases of the circulatory system: Secondary | ICD-10-CM

## 2018-05-28 DIAGNOSIS — I130 Hypertensive heart and chronic kidney disease with heart failure and stage 1 through stage 4 chronic kidney disease, or unspecified chronic kidney disease: Secondary | ICD-10-CM | POA: Diagnosis present

## 2018-05-28 DIAGNOSIS — Z7189 Other specified counseling: Secondary | ICD-10-CM | POA: Diagnosis not present

## 2018-05-28 DIAGNOSIS — G9340 Encephalopathy, unspecified: Secondary | ICD-10-CM | POA: Diagnosis not present

## 2018-05-28 DIAGNOSIS — E119 Type 2 diabetes mellitus without complications: Secondary | ICD-10-CM | POA: Diagnosis not present

## 2018-05-28 DIAGNOSIS — Z981 Arthrodesis status: Secondary | ICD-10-CM

## 2018-05-28 DIAGNOSIS — Z6828 Body mass index (BMI) 28.0-28.9, adult: Secondary | ICD-10-CM

## 2018-05-28 DIAGNOSIS — E118 Type 2 diabetes mellitus with unspecified complications: Secondary | ICD-10-CM | POA: Diagnosis not present

## 2018-05-28 DIAGNOSIS — E876 Hypokalemia: Secondary | ICD-10-CM | POA: Diagnosis present

## 2018-05-28 DIAGNOSIS — Z7982 Long term (current) use of aspirin: Secondary | ICD-10-CM

## 2018-05-28 DIAGNOSIS — E872 Acidosis: Secondary | ICD-10-CM | POA: Diagnosis not present

## 2018-05-28 DIAGNOSIS — I5022 Chronic systolic (congestive) heart failure: Secondary | ICD-10-CM | POA: Diagnosis not present

## 2018-05-28 DIAGNOSIS — Z7901 Long term (current) use of anticoagulants: Secondary | ICD-10-CM

## 2018-05-28 DIAGNOSIS — Z888 Allergy status to other drugs, medicaments and biological substances status: Secondary | ICD-10-CM

## 2018-05-28 DIAGNOSIS — M6281 Muscle weakness (generalized): Secondary | ICD-10-CM | POA: Diagnosis not present

## 2018-05-28 DIAGNOSIS — M1280 Other specific arthropathies, not elsewhere classified, unspecified site: Secondary | ICD-10-CM | POA: Diagnosis not present

## 2018-05-28 DIAGNOSIS — M1990 Unspecified osteoarthritis, unspecified site: Secondary | ICD-10-CM | POA: Diagnosis present

## 2018-05-28 DIAGNOSIS — I209 Angina pectoris, unspecified: Secondary | ICD-10-CM | POA: Diagnosis not present

## 2018-05-28 DIAGNOSIS — Z91041 Radiographic dye allergy status: Secondary | ICD-10-CM

## 2018-05-28 DIAGNOSIS — F419 Anxiety disorder, unspecified: Secondary | ICD-10-CM | POA: Diagnosis not present

## 2018-05-28 DIAGNOSIS — F0390 Unspecified dementia without behavioral disturbance: Secondary | ICD-10-CM | POA: Diagnosis present

## 2018-05-28 DIAGNOSIS — I10 Essential (primary) hypertension: Secondary | ICD-10-CM | POA: Diagnosis not present

## 2018-05-28 DIAGNOSIS — N400 Enlarged prostate without lower urinary tract symptoms: Secondary | ICD-10-CM | POA: Diagnosis not present

## 2018-05-28 LAB — COMPREHENSIVE METABOLIC PANEL
ALBUMIN: 3.5 g/dL (ref 3.5–5.0)
ALK PHOS: 47 U/L (ref 38–126)
ALT: 18 U/L (ref 17–63)
AST: 46 U/L — AB (ref 15–41)
Anion gap: 27 — ABNORMAL HIGH (ref 5–15)
BILIRUBIN TOTAL: 1.8 mg/dL — AB (ref 0.3–1.2)
BUN: 97 mg/dL — AB (ref 6–20)
CALCIUM: 10.3 mg/dL (ref 8.9–10.3)
CO2: 22 mmol/L (ref 22–32)
Chloride: 91 mmol/L — ABNORMAL LOW (ref 101–111)
Creatinine, Ser: 4.55 mg/dL — ABNORMAL HIGH (ref 0.61–1.24)
GFR calc Af Amer: 14 mL/min — ABNORMAL LOW (ref >60)
GFR calc non Af Amer: 12 mL/min — ABNORMAL LOW (ref >60)
GLUCOSE: 385 mg/dL — AB (ref 65–99)
Potassium: 2.9 mmol/L — ABNORMAL LOW (ref 3.5–5.1)
Sodium: 140 mmol/L (ref 135–145)
TOTAL PROTEIN: 9.2 g/dL — AB (ref 6.5–8.1)

## 2018-05-28 LAB — CBC WITH DIFFERENTIAL/PLATELET
Abs Immature Granulocytes: 0.1 10*3/uL (ref 0.0–0.1)
BASOS ABS: 0.1 10*3/uL (ref 0.0–0.1)
BASOS PCT: 1 %
EOS PCT: 0 %
Eosinophils Absolute: 0 10*3/uL (ref 0.0–0.7)
HCT: 46.1 % (ref 39.0–52.0)
Hemoglobin: 15.7 g/dL (ref 13.0–17.0)
Immature Granulocytes: 1 %
LYMPHS PCT: 8 %
Lymphs Abs: 1.4 10*3/uL (ref 0.7–4.0)
MCH: 29.9 pg (ref 26.0–34.0)
MCHC: 34.1 g/dL (ref 30.0–36.0)
MCV: 87.8 fL (ref 78.0–100.0)
MONO ABS: 2.1 10*3/uL — AB (ref 0.1–1.0)
Monocytes Relative: 12 %
Neutro Abs: 13.6 10*3/uL — ABNORMAL HIGH (ref 1.7–7.7)
Neutrophils Relative %: 78 %
Platelets: 586 10*3/uL — ABNORMAL HIGH (ref 150–400)
RBC: 5.25 MIL/uL (ref 4.22–5.81)
RDW: 13.8 % (ref 11.5–15.5)
WBC: 17.3 10*3/uL — ABNORMAL HIGH (ref 4.0–10.5)

## 2018-05-28 LAB — BASIC METABOLIC PANEL
Anion gap: 21 — ABNORMAL HIGH (ref 5–15)
BUN: 92 mg/dL — ABNORMAL HIGH (ref 6–20)
CO2: 25 mmol/L (ref 22–32)
Calcium: 9.5 mg/dL (ref 8.9–10.3)
Chloride: 93 mmol/L — ABNORMAL LOW (ref 101–111)
Creatinine, Ser: 3.93 mg/dL — ABNORMAL HIGH (ref 0.61–1.24)
GFR calc Af Amer: 16 mL/min — ABNORMAL LOW (ref >60)
GFR calc non Af Amer: 14 mL/min — ABNORMAL LOW (ref >60)
GLUCOSE: 389 mg/dL — AB (ref 65–99)
POTASSIUM: 2.6 mmol/L — AB (ref 3.5–5.1)
Sodium: 139 mmol/L (ref 135–145)

## 2018-05-28 LAB — GLUCOSE, CAPILLARY
Glucose-Capillary: 309 mg/dL — ABNORMAL HIGH (ref 65–99)
Glucose-Capillary: 260 mg/dL — ABNORMAL HIGH (ref 65–99)

## 2018-05-28 LAB — POC OCCULT BLOOD, ED: FECAL OCCULT BLD: POSITIVE — AB

## 2018-05-28 LAB — CK: Total CK: 784 U/L — ABNORMAL HIGH (ref 49–397)

## 2018-05-28 LAB — I-STAT CG4 LACTIC ACID, ED
Lactic Acid, Venous: 3.54 mmol/L (ref 0.5–1.9)
Lactic Acid, Venous: 2.64 mmol/L (ref 0.5–1.9)

## 2018-05-28 LAB — TYPE AND SCREEN
ABO/RH(D): B NEG
ANTIBODY SCREEN: NEGATIVE

## 2018-05-28 LAB — I-STAT CHEM 8, ED
BUN: 73 mg/dL — AB (ref 6–20)
CREATININE: 4.2 mg/dL — AB (ref 0.61–1.24)
Calcium, Ion: 1.02 mmol/L — ABNORMAL LOW (ref 1.15–1.40)
Chloride: 95 mmol/L — ABNORMAL LOW (ref 101–111)
GLUCOSE: 408 mg/dL — AB (ref 65–99)
HCT: 48 % (ref 39.0–52.0)
HEMOGLOBIN: 16.3 g/dL (ref 13.0–17.0)
POTASSIUM: 2.7 mmol/L — AB (ref 3.5–5.1)
Sodium: 139 mmol/L (ref 135–145)
TCO2: 23 mmol/L (ref 22–32)

## 2018-05-28 LAB — LACTIC ACID, PLASMA: LACTIC ACID, VENOUS: 1.8 mmol/L (ref 0.5–1.9)

## 2018-05-28 LAB — I-STAT TROPONIN, ED: Troponin i, poc: 0.31 ng/mL (ref 0.00–0.08)

## 2018-05-28 LAB — CBG MONITORING, ED: GLUCOSE-CAPILLARY: 378 mg/dL — AB (ref 65–99)

## 2018-05-28 LAB — TROPONIN I: TROPONIN I: 0.17 ng/mL — AB (ref <0.03)

## 2018-05-28 LAB — ABO/RH: ABO/RH(D): B NEG

## 2018-05-28 MED ORDER — LORAZEPAM 2 MG/ML IJ SOLN
0.5000 mg | Freq: Once | INTRAMUSCULAR | Status: AC
  Administered 2018-05-28: 0.5 mg via INTRAVENOUS
  Filled 2018-05-28: qty 1

## 2018-05-28 MED ORDER — SODIUM CHLORIDE 0.9% FLUSH
3.0000 mL | Freq: Two times a day (BID) | INTRAVENOUS | Status: DC
  Administered 2018-05-28 – 2018-06-02 (×9): 3 mL via INTRAVENOUS

## 2018-05-28 MED ORDER — RANOLAZINE ER 500 MG PO TB12: 1000.0000 mg | ORAL_TABLET | Freq: Two times a day (BID) | ORAL | Status: DC

## 2018-05-28 MED ORDER — SODIUM CHLORIDE 0.9 % IV SOLN
INTRAVENOUS | Status: DC
  Administered 2018-05-28: 3.2 [IU]/h via INTRAVENOUS
  Filled 2018-05-28: qty 1

## 2018-05-28 MED ORDER — NITROGLYCERIN 0.4 MG SL SUBL: 0.4000 mg | SUBLINGUAL_TABLET | SUBLINGUAL | Status: DC | PRN

## 2018-05-28 MED ORDER — DEXTROSE-NACL 5-0.45 % IV SOLN
INTRAVENOUS | Status: DC
  Administered 2018-05-28 – 2018-05-29 (×2): via INTRAVENOUS

## 2018-05-28 MED ORDER — ACETAMINOPHEN 650 MG RE SUPP: 650.0000 mg | Freq: Four times a day (QID) | RECTAL | Status: DC | PRN

## 2018-05-28 MED ORDER — SODIUM CHLORIDE 0.9 % IV SOLN: INTRAVENOUS | Status: AC

## 2018-05-28 MED ORDER — DILTIAZEM HCL-DEXTROSE 100-5 MG/100ML-% IV SOLN (PREMIX)
5.0000 mg/h | Freq: Once | INTRAVENOUS | Status: AC
  Administered 2018-05-28: 5 mg/h via INTRAVENOUS
  Filled 2018-05-28: qty 100

## 2018-05-28 MED ORDER — SODIUM CHLORIDE 0.9 % IV SOLN
INTRAVENOUS | Status: DC
  Administered 2018-05-29 – 2018-05-30 (×3): via INTRAVENOUS

## 2018-05-28 MED ORDER — SODIUM CHLORIDE 0.9 % IV BOLUS
500.0000 mL | Freq: Once | INTRAVENOUS | Status: AC
  Administered 2018-05-28: 500 mL via INTRAVENOUS

## 2018-05-28 MED ORDER — DILTIAZEM HCL-DEXTROSE 100-5 MG/100ML-% IV SOLN (PREMIX)
5.0000 mg/h | INTRAVENOUS | Status: DC
  Administered 2018-05-29: 5 mg/h via INTRAVENOUS
  Filled 2018-05-28: qty 100

## 2018-05-28 MED ORDER — VANCOMYCIN HCL IN DEXTROSE 1-5 GM/200ML-% IV SOLN
1000.0000 mg | INTRAVENOUS | Status: DC
  Administered 2018-05-28: 1000 mg via INTRAVENOUS
  Filled 2018-05-28: qty 200

## 2018-05-28 MED ORDER — CLOPIDOGREL BISULFATE 75 MG PO TABS
75.0000 mg | ORAL_TABLET | Freq: Every day | ORAL | Status: DC
  Administered 2018-05-29 – 2018-06-02 (×5): 75 mg via ORAL
  Filled 2018-05-28 (×6): qty 1

## 2018-05-28 MED ORDER — HEPARIN SODIUM (PORCINE) 5000 UNIT/ML IJ SOLN
5000.0000 [IU] | Freq: Three times a day (TID) | INTRAMUSCULAR | Status: DC
  Administered 2018-05-28 – 2018-05-29 (×2): 5000 [IU] via SUBCUTANEOUS
  Filled 2018-05-28 (×2): qty 1

## 2018-05-28 MED ORDER — PANTOPRAZOLE SODIUM 40 MG PO TBEC
40.0000 mg | DELAYED_RELEASE_TABLET | Freq: Every day | ORAL | Status: DC
  Administered 2018-05-29 – 2018-06-02 (×5): 40 mg via ORAL
  Filled 2018-05-28 (×5): qty 1

## 2018-05-28 MED ORDER — LEVOFLOXACIN IN D5W 250 MG/50ML IV SOLN: 250.0000 mg | INTRAVENOUS | Status: DC

## 2018-05-28 MED ORDER — SODIUM CHLORIDE 0.9 % IV SOLN
1.0000 g | Freq: Once | INTRAVENOUS | Status: AC
  Administered 2018-05-28: 1 g via INTRAVENOUS
  Filled 2018-05-28: qty 1

## 2018-05-28 MED ORDER — LATANOPROST 0.005 % OP SOLN
1.0000 [drp] | Freq: Every day | OPHTHALMIC | Status: DC
  Administered 2018-05-29 – 2018-06-01 (×5): 1 [drp] via OPHTHALMIC
  Filled 2018-05-28: qty 2.5

## 2018-05-28 MED ORDER — ASPIRIN EC 81 MG PO TBEC
81.0000 mg | DELAYED_RELEASE_TABLET | Freq: Every day | ORAL | Status: DC
Start: 2018-05-28 — End: 2018-05-30
  Administered 2018-05-29 – 2018-05-30 (×2): 81 mg via ORAL
  Filled 2018-05-28 (×2): qty 1

## 2018-05-28 MED ORDER — POTASSIUM CHLORIDE 10 MEQ/100ML IV SOLN
10.0000 meq | INTRAVENOUS | Status: AC
  Administered 2018-05-28 – 2018-05-29 (×6): 10 meq via INTRAVENOUS
  Filled 2018-05-28 (×6): qty 100

## 2018-05-28 MED ORDER — TAMSULOSIN HCL 0.4 MG PO CAPS
0.4000 mg | ORAL_CAPSULE | Freq: Every day | ORAL | Status: DC
  Administered 2018-05-29 – 2018-06-02 (×5): 0.4 mg via ORAL
  Filled 2018-05-28 (×5): qty 1

## 2018-05-28 MED ORDER — POTASSIUM CHLORIDE 10 MEQ/50ML IV SOLN: 10.0000 meq | INTRAVENOUS | Status: DC

## 2018-05-28 MED ORDER — ISOSORBIDE MONONITRATE ER 30 MG PO TB24
60.0000 mg | ORAL_TABLET | Freq: Every day | ORAL | Status: DC
  Administered 2018-05-29 – 2018-06-02 (×5): 60 mg via ORAL
  Filled 2018-05-28 (×5): qty 2

## 2018-05-28 MED ORDER — PREDNISONE 5 MG PO TABS
5.0000 mg | ORAL_TABLET | Freq: Every day | ORAL | Status: DC
  Administered 2018-05-30 – 2018-06-02 (×4): 5 mg via ORAL
  Filled 2018-05-28 (×5): qty 1

## 2018-05-28 MED ORDER — ACETAMINOPHEN 325 MG PO TABS: 650.0000 mg | ORAL_TABLET | Freq: Four times a day (QID) | ORAL | Status: DC | PRN

## 2018-05-28 MED ORDER — ALPRAZOLAM 0.5 MG PO TABS
0.5000 mg | ORAL_TABLET | Freq: Every evening | ORAL | Status: DC | PRN
  Administered 2018-06-01: 0.5 mg via ORAL
  Filled 2018-05-28: qty 1

## 2018-05-28 MED ORDER — POTASSIUM CHLORIDE 10 MEQ/100ML IV SOLN: 10.0000 meq | INTRAVENOUS | Status: AC

## 2018-05-28 NOTE — Progress Notes (Signed)
Pharmacy Antibiotic Note  Julian Ross is a 72 y.o. male admitted on 05/28/2018 with possible sepsis. Pharmacy has been consulted for vancomycin dosing. Patient has also received cefepime x1 in the ED. WBC 17.3, current temp 100.3. Scr 4.2 (BL ~2.2), estimated CrCl ~20 mL/min.  Plan: Vancomycin 1000mg  IV q24h Watch renal function closely - if Scr declines, will need to adjust dose F/u C&S, clinical status, renal function, de-escalation, LOT, vancomycin levels as indicated  Height: 5\' 11"  (180.3 cm) Weight: 202 lb (91.6 kg) IBW/kg (Calculated) : 75.3  Temp (24hrs), Avg:100.3 F (37.9 C), Min:100.3 F (37.9 C), Max:100.3 F (37.9 C)  Recent Labs  Lab 05/28/18 1402 05/28/18 1440  WBC 17.3*  --   CREATININE 4.55* 4.20*  LATICACIDVEN  --  3.54*    Estimated Creatinine Clearance: 18.7 mL/min (A) (by C-G formula based on SCr of 4.2 mg/dL (H)).    Allergies  Allergen Reactions  . Statins Other (See Comments)    Causes stiffness in joints   . Ace Inhibitors Cough  . Contrast Media [Iodinated Diagnostic Agents] Rash    Has to take benadryl prior to use  . Penicillins Hives and Rash    Has patient had a PCN reaction causing immediate rash, facial/tongue/throat swelling, SOB or lightheadedness with hypotension: Yes Has patient had a PCN reaction causing severe rash involving mucus membranes or skin necrosis: No Has patient had a PCN reaction that required hospitalization pt was in the hospital at time of last reaction - heart attack Has patient had a PCN reaction occurring within the last 10 years: No If all of the above answers are "NO", then may proceed with Cephalosporin use.   Antimicrobials this admission: Vanc 5/29 >>  Cefepime 5/29 x1  Dose adjustments this admission: None  Microbiology results: None  Thank you for allowing pharmacy to be a part of this patient's care.  Mila Merry Gerarda Fraction, PharmD PGY1 Pharmacy Resident Pager: [phone] 05/28/2018 4:20 PM

## 2018-05-28 NOTE — ED Triage Notes (Signed)
Pt arrived via [facility] from home where family reports increased AMS X3 days. States that pt has baseline dementia but has increased confusion. Pt was found to be in Afib RVR with a rate of 140-180, was given 5 of metoprolol PTA, rate now 90-140. Per EMS pt was wedged in between wall and bed. Pt is on Plavix. CBG 440. Marland Kitchen

## 2018-05-28 NOTE — ED Notes (Signed)
This RN transported patient to CT scan

## 2018-05-28 NOTE — H&P (Signed)
History and Physical  Cray Monnin AYT:[id number] DOB: Dec 23, 1946 DOA: 05/28/2018  PCP: Reynold Bowen, MD   Chief Complaint: confused  HPI:  72 year old man PMH diabetes mellitus, coronary artery disease, dementia, presented to the emergency department with increasing confusion, falls.  Admitted for acute kidney injury, atrial fibrillation with rapid ventricular response, DKA, acute encephalopathy.  Patient has long-standing dementia with acute worsening of encephalopathy over the last several days.  He is unable to provide any history.  All history obtained from wife and son at bedside.  Patient has long-standing dementia, however at baseline he is able to ambulate without assistance, eat without assistance.  He recognizes family members but short-term memory is poor.  In the last month his sleep schedule has changed, he previously would sleep 18 hours a day, but now has been staying up most the night.  Over the last few days, his oral intake is dropped to minimal, he has become more confused, unable to carry on a conversation, now with muscle jerks and has fallen out of bed several times.  He typically administers his own medications but has not done so for the last couple of days.  Typically on insulin and has suffered from hypoglycemic episodes but also hyperglycemic episodes.  Review of systems is severely limited but there is been no fever or systemic symptoms per wife.  Remainder as below.  ED Course: Tachycardic, hypertensive.  Treated with lorazepam, diltiazem infusion, normal saline.  Review of Systems:  As noted above severely limited.  Patient unable to provide history.  As far as wife knows: Negative for fever, bleeding, n/v/abdominal pain.  Positive for recent chest pain for which he saw his cardiologist Dr. [name].  Past Medical History:  Diagnosis Date  . Anemia   . Anxiety state   . CAD (coronary artery disease)    16 prior stents (at [facility])  .  Cardiomyopathy, ischemic   . Chronic lower back pain   . Chronic systolic CHF (congestive heart failure) ([place])   . CKD (chronic kidney disease), stage III ([place])   . Depression   . Diabetes mellitus type 2 in obese ([place])   . Fibromyalgia   . Hepatitis A    at age 45  . High cholesterol   . History of blood transfusion    "related to OR"  . Hypertension   . Hypokalemia   . MI (myocardial infarction) ([place]) 1990; 1995; 1997  . OSA on CPAP    setting = 13, full face mask  . Osteoarthritis   . Persistent atrial fibrillation ([place]) 02/2016   on Eliquis  . Rheumatoid arthritis of multiple sites with negative rheumatoid factor ([place])   . Septic shock ([place]) 02/2016   in setting of severe pneumonia    Past Surgical History:  Procedure Laterality Date  . BACK SURGERY    . CARDIAC CATHETERIZATION N/A 10/03/2016   Procedure: Left Heart Cath and Coronary Angiography;  Surgeon: Belva Crome, MD;  Location: [facility];  Service: Cardiovascular;  Laterality: N/A;  . CARDIOVERSION N/A 06/04/2016   Procedure: CARDIOVERSION;  Surgeon: Skeet Latch, MD;  Location: [facility];  Service: Cardiovascular;  Laterality: N/A;  . CATARACT EXTRACTION W/ INTRAOCULAR LENS  IMPLANT, BILATERAL Bilateral 2000s  . CORONARY ANGIOPLASTY  early 10s  . CORONARY ANGIOPLASTY WITH STENT PLACEMENT     "I've got 15 stents" (07/19/2016)  . KNEE ARTHROSCOPY Left 1990s  . LEFT HEART CATH AND CORONARY ANGIOGRAPHY N/A 11/15/2017   Procedure: LEFT  HEART CATH AND CORONARY ANGIOGRAPHY;  Surgeon: Jettie Booze, MD;  Location: [facility];  Service: Cardiovascular;  Laterality: N/A;  . NASAL SINUS SURGERY    . POSTERIOR LUMBAR FUSION  2015   L4-5-S1  . right knee surgery     age of 67  . TONSILLECTOMY AND ADENOIDECTOMY  1949     reports that he has never smoked. He has never used smokeless tobacco. He reports that he does not drink alcohol or use drugs. Mobility: Ambulatory  Allergies  Allergen  Reactions  . Statins Other (See Comments)    Causes stiffness in joints   . Ace Inhibitors Cough  . Contrast Media [Iodinated Diagnostic Agents] Rash    Has to take benadryl prior to use  . Penicillins Hives and Rash    Has patient had a PCN reaction causing immediate rash, facial/tongue/throat swelling, SOB or lightheadedness with hypotension: Yes Has patient had a PCN reaction causing severe rash involving mucus membranes or skin necrosis: No Has patient had a PCN reaction that required hospitalization pt was in the hospital at time of last reaction - heart attack Has patient had a PCN reaction occurring within the last 10 years: No If all of the above answers are "NO", then may proceed with Cephalosporin use.    Family History  Problem Relation Age of Onset  . Heart failure Mother   . Hyperlipidemia Mother   . Hypertension Mother   . Heart attack Father   . Heart attack Brother      Prior to Admission medications   Medication Sig Start Date End Date Taking? Authorizing Provider  acetaminophen (TYLENOL) 325 MG tablet Take 650 mg by mouth every 4 (four) hours as needed for mild pain or headache.    [provider]  ALPRAZolam (NIRAVAM) 0.5 MG dissolvable tablet Take 1 tablet by mouth as needed (Take as directed).  07/26/16   [provider]  apixaban (ELIQUIS) 5 MG TABS tablet Take 1 tablet (5 mg total) by mouth 2 (two) times daily. 04/01/16   Nita Sells, MD  aspirin EC 81 MG EC tablet Take 1 tablet (81 mg total) daily by mouth. 11/18/17   Rama, Venetia Maxon, MD  carvedilol (COREG) 12.5 MG tablet Take 1 tablet (12.5 mg total) by mouth 2 (two) times daily with a meal. 04/24/18   Kilroy, Doreene Burke, PA-C  clopidogrel (PLAVIX) 75 MG tablet Take 1 tablet (75 mg total) by mouth daily. 11/28/17   Erlene Quan, PA-C  clotrimazole (LOTRIMIN) 1 % cream Apply 1 application topically 2 (two) times daily. Patient taking differently: Apply 1 application 2 (two) times daily  as needed topically (dry skin).  11/06/16   Patrecia Pour, MD  Evolocumab (REPATHA SURECLICK) 324 MG/ML SOAJ Inject 140 mg into the skin every 14 (fourteen) days. 04/25/17   Skeet Latch, MD  fenofibrate (TRICOR) 145 MG tablet Take 145 mg by mouth daily.    [provider]  folic acid (FOLVITE) 1 MG tablet Take 2 mg by mouth daily.     [provider]  furosemide (LASIX) 80 MG tablet Take 1 tablet (80 mg total) by mouth 2 (two) times daily. Patient taking differently: Take 100 mg 2 (two) times daily by mouth.  10/30/17   Erlene Quan, PA-C  gabapentin (NEURONTIN) 300 MG capsule Take 300 mg by mouth at bedtime.  08/22/16   [provider]  hydrocortisone (ANUSOL-HC) 2.5 % rectal cream Place 1 application rectally 2 (two)  times daily as needed for hemorrhoids or itching. 07/23/16   Robbie Lis, MD  insulin lispro protamine-lispro (HUMALOG 50/50 MIX) (50-50) 100 UNIT/ML SUSP injection Inject 40-50 Units into the skin See admin instructions. Takes 50 units in am, 40 units midday, 50 units in pm    [provider]  isosorbide mononitrate (IMDUR) 60 MG 24 hr tablet Take 1 tablet (60 mg total) daily by mouth. 11/18/17   Rama, Venetia Maxon, MD  leucovorin (WELLCOVORIN) 10 MG tablet Take 10 mg by mouth See admin instructions. Take 1 tablet (10 mg) by mouth every 12 hours and 24 hours after the methotrexate dose    [provider]  LUMIGAN 0.01 % SOLN Place 1 drop into both eyes at bedtime.  04/30/16   [provider]  magnesium oxide (MAG-OX) 400 (241.3 Mg) MG tablet Take 1 tablet (400 mg total) by mouth 2 (two) times daily. 04/01/16   Nita Sells, MD  Methotrexate Sodium (METHOTREXATE, PF,) 50 MG/2ML injection Inject 15 mg into the muscle every Friday.  03/09/16   [provider]  Misc Natural Products (OSTEO BI-FLEX ADV DOUBLE ST PO) Take 1 tablet by mouth daily as needed (takes occassionally).     [provider]  nitroGLYCERIN  (NITROSTAT) 0.4 MG SL tablet PLACE 1 TABLET UNDER THE TONGUE IF NEEDED 05/14/18   Skeet Latch, MD  omega-3 acid ethyl esters (LOVAZA) 1 g capsule Take 1 capsule (1 g total) by mouth 2 (two) times daily. 12/26/17   Skeet Latch, MD  pantoprazole (PROTONIX) 40 MG tablet TAKE 1 TABLET(40 MG) BY MOUTH DAILY 05/08/18   Erlene Quan, PA-C  PARoxetine (PAXIL) 40 MG tablet Take 40 mg by mouth daily.  04/30/16   [provider]  potassium chloride SA (K-DUR,KLOR-CON) 20 MEQ tablet TAKE 2 TABLETS BY MOUTH TWICE DAILY 05/01/18   Skeet Latch, MD  predniSONE (DELTASONE) 5 MG tablet Take 5 mg by mouth daily with breakfast.    [provider]  ranolazine (RANEXA) 1000 MG SR tablet Take 1 tablet (1,000 mg total) by mouth 2 (two) times daily. 05/05/18   Skeet Latch, MD  tamsulosin (FLOMAX) 0.4 MG CAPS capsule Take 0.4 mg by mouth daily.  02/21/16   [provider]  tiZANidine (ZANAFLEX) 4 MG tablet Take 1 tablet by mouth every 8 (eight) hours as needed for muscle spasms.  05/29/16   [provider]  topiramate (TOPAMAX) 25 MG tablet Take 25 mg by mouth 2 (two) times daily.    [provider]  traMADol (ULTRAM) 50 MG tablet Take 1 tablet by mouth 3 (three) times daily as needed (pain). Take as directed 08/24/16   [provider]  VITAMIN D, ERGOCALCIFEROL, PO Take 5,000 Units by mouth daily.     [provider]  ZETIA 10 MG tablet Take 10 mg by mouth daily.  07/08/16   [provider]    Physical Exam: Vitals:   05/28/18 1749 05/28/18 1800  BP: (!) 157/100 (!) 149/89  Pulse: 96   Resp:    Temp:    SpO2: 98%     Constitutional:   . Appears mostly calm and comfortable.  Restless intermittently, confused. Eyes:  . pupils and irises appear normal . Normal lids  ENMT:  . grossly normal hearing  . Lips appear normal Neck:  . neck appears normal, no masses . no thyromegaly Respiratory:  . CTA bilaterally, no w/r/r.   . Respiratory effort normal. Cardiovascular:  . RRR, no  m/r/g . No LE extremity edema   Abdomen:  . Abdomen appears normal; no tenderness or masses . No hernias noted Musculoskeletal:  . Digits/nails BUE: no clubbing, cyanosis, petechiae, infection . RUE, LUE, RLE, LLE   o strength and tone normal, no atrophy o No tenderness, masses Skin:  . No rashes, lesions, ulcers.  Entire back appears unremarkable.  Sacrum and buttocks appear unremarkable.  Perineum appears unremarkable.  Penis and scrotum appear unremarkable. . palpation of skin: no induration or nodules Neurologic:  . Grossly nonfocal Psychiatric:  . Mental status o Cannot assess mood or affect.  Clearly confused.  Unable to assess orientation.  Judgment and insight appear severely impaired.  I have personally reviewed following labs and imaging studies  Labs:   Potassium 2.9, glucose 385, BUN 97, creatinine 4.55, anion gap 27  Total bilirubin 1.8, AST 846  Troponin 0.31  Lactic acid 3.54 >> 2.64  Urinalysis pending  Imaging studies:   Chest x-ray no acute disease, independently reviewed  CT head no acute abnormalities.  Wife reports multiple sinus surgeries.  Suspect sinusitis is mostly chronic.  Medical tests:   EKG independently reviewed: Atrial fibrillation with rapid ventricular response, left bundle branch block, old, probable inferior infarct, old.  Compared to previous studies intraventricular conduction delay is old as it is probable inferior infarct.   Active Problems:   CKD (chronic kidney disease) stage 3, GFR 30-59 ml/min (HCC)   AKI (acute kidney injury) ([place])   Atrial fibrillation with RVR (HCC)   DKA (diabetic ketoacidoses) ([place])   Demand ischemia (HCC)   Assessment/Plan Acute kidney injury superimposed on CKD stage III, multifactorial including poor oral intake, osmotic diuresis secondary to hyperglycemia, furosemide.  Cannot rule out postrenal contribution at this point.  Potassium is  low. --IV fluids, serial BMP, control hyperglycemia, hold diuretics.  Strict I/Os.  If fails to improve, check renal ultrasound 5/30.  DKA, likely precipitated by noncompliance with insulin and poor oral intake.  No evidence of infection.  Elevated troponin probably reflects demand ischemia secondary to rapid heart rate rather than precipitating event. --IV fluids, insulin infusion, serial BMP  Atrial fibrillation with rapid ventricular response.  Likely precipitated by dehydration, acute illness. --Rate currently controlled.  Continue IV diltiazem infusion.  Hold apixaban for now given renal dysfunction.  Prophylactic heparin.  Demand ischemia, possible type II NSTEMI secondary to atrial fibrillation with rapid ventricular response and dehydration. --Trend troponin.  Elevated lactic acid.  Chest x-ray is clear, skin exam is unremarkable, no localizing symptoms reported per family.  Urinalysis is pending but I think sepsis is less likely than volume depletion.  Cannot exclude infection at this point but do not suspect sepsis. --Trend lactic acid. --Urinalysis is pending. --Empiric antibiotics given in ED.  Does have leukocytosis although this was seen November of last year.  If urinalysis is negative would stop antibiotics and observe.  Hypokalemia.  Monitor closely given acute kidney injury.  Rheumatoid arthritis --Continue prednisone    Severity of Illness: The appropriate patient status for this patient is INPATIENT. Inpatient status is judged to be reasonable and necessary in order to provide the required intensity of service to ensure the patient's safety. The patient's presenting symptoms, physical exam findings, and initial radiographic and laboratory data in the context of their chronic comorbidities is felt to place them at high risk for further clinical deterioration. Furthermore, it is not anticipated that the patient will be medically stable for discharge from the hospital within  2 midnights  of admission. The following factors support the patient status of inpatient.   See above  * I certify that at the point of admission it is my clinical judgment that the patient will require inpatient hospital care spanning beyond 2 midnights from the point of admission due to high intensity of service, high risk for further deterioration and high frequency of surveillance required.*     DVT prophylaxis: heparin Code Status: DNR/DNI Family Communication: wife and son at bedside    Time spent: 65 minutes  Murray Hodgkins, MD  [facility] Direct contact: [phone] --[place]ia [place]  --[email or web address]; password [email or web address]  7PM-7AM contact night coverage as above  05/28/2018, 6:29 PM

## 2018-05-28 NOTE — Progress Notes (Signed)
Pharmacy Antibiotic Note  Julian Ross is a 72 y.o. male admitted on 05/28/2018 with possible UTI. Patient has received vancomycin and cefepime x1 in the ED. Pharmacy now consulted to switch to levofloxacin. WBC 17.3, current temp 100.3. Scr 4.2 (BL ~2.2), estimated CrCl ~20 mL/min.  Plan: Levofloxacin 250mg  q24h Per MD, if UA is negative, d/c abx - f/u UA results F/u C&S, clinical status, renal function, de-escalation, LOT, vancomycin levels as indicated  Height: 5\' 11"  (180.3 cm) Weight: 202 lb (91.6 kg) IBW/kg (Calculated) : 75.3  Temp (24hrs), Avg:100.3 F (37.9 C), Min:100.3 F (37.9 C), Max:100.3 F (37.9 C)  Recent Labs  Lab 05/28/18 1402 05/28/18 1440 05/28/18 1622  WBC 17.3*  --   --   CREATININE 4.55* 4.20*  --   LATICACIDVEN  --  3.54* 2.64*    Estimated Creatinine Clearance: 18.7 mL/min (A) (by C-G formula based on SCr of 4.2 mg/dL (H)).    Allergies  Allergen Reactions  . Statins Other (See Comments)    Causes stiffness in joints   . Ace Inhibitors Cough  . Contrast Media [Iodinated Diagnostic Agents] Rash    Has to take benadryl prior to use  . Penicillins Hives and Rash    Has patient had a PCN reaction causing immediate rash, facial/tongue/throat swelling, SOB or lightheadedness with hypotension: Yes Has patient had a PCN reaction causing severe rash involving mucus membranes or skin necrosis: No Has patient had a PCN reaction that required hospitalization pt was in the hospital at time of last reaction - heart attack Has patient had a PCN reaction occurring within the last 10 years: No If all of the above answers are "NO", then may proceed with Cephalosporin use.   Antimicrobials this admission: Vanc 5/29 x1 Cefepime 5/29 x1 Levofloxacin 5/29 >>  Dose adjustments this admission: None  Microbiology results: None  Thank you for allowing pharmacy to be a part of this patient's care.  Mila Merry Gerarda Fraction, PharmD PGY1 Pharmacy Resident Pager:  [phone] 05/28/2018 6:43 PM

## 2018-05-28 NOTE — ED Notes (Signed)
Report given to rn on 2c 

## 2018-05-28 NOTE — ED Notes (Signed)
Ativan given per request of his wife  For restlessness

## 2018-05-28 NOTE — ED Notes (Signed)
Admitting doctor at the bedside 

## 2018-05-28 NOTE — ED Notes (Signed)
Pt agitated wife wants stivan given again

## 2018-05-28 NOTE — ED Provider Notes (Addendum)
[facility] EMERGENCY DEPARTMENT Provider Note   CSN: [id number] Arrival date & time: 05/28/18  1343     History   Chief Complaint Chief Complaint  Patient presents with  . Altered Mental Status  . Atrial Fibrillation    HPI Julian Ross is a 72 y.o. male.  The history is provided by the patient. No language interpreter was used.  Altered Mental Status    Atrial Fibrillation    Julian Ross is a 72 y.o. male who presents to the Emergency Department complaining of AMS. Level V, caveat due to altered mental status. History is provided by EMS. Per report he has been confused for the last three days. He has baseline dementia but symptoms are significantly worse. He had a fall today at home and 911 was called. On EMS arrival he was found to be in atrial fibrillation with RVR with a rate of 140 to 180. He received 5 mg of metoprolol IV with rate improved to 90s to 140s. Past Medical History:  Diagnosis Date  . Anemia   . Anxiety state   . CAD (coronary artery disease)    16 prior stents (at [facility])  . Cardiomyopathy, ischemic   . Chronic lower back pain   . Chronic systolic CHF (congestive heart failure) ([place])   . CKD (chronic kidney disease), stage III ([place])   . Depression   . Diabetes mellitus type 2 in obese ([place])   . Fibromyalgia   . Hepatitis A    at age 46  . High cholesterol   . History of blood transfusion    "related to OR"  . Hypertension   . Hypokalemia   . MI (myocardial infarction) ([place]) 1990; 1995; 1997  . OSA on CPAP    setting = 13, full face mask  . Osteoarthritis   . Persistent atrial fibrillation ([place]) 02/2016   on Eliquis  . Rheumatoid arthritis of multiple sites with negative rheumatoid factor ([place])   . Septic shock ([place]) 02/2016   in setting of severe pneumonia    Patient Active Problem List   Diagnosis Date Noted  . Atrial fibrillation with RVR ([place]) 05/28/2018  . Abscess 11/28/2017  . Anemia 11/28/2017  .  Anxiety 11/28/2017  . Ischemic cardiomyopathy 11/28/2017  . Chronic back pain 11/28/2017  . Depression 11/28/2017  . DOE (dyspnea on exertion) 11/28/2017  . Leg edema 11/28/2017  . Essential tremor 11/28/2017  . GERD (gastroesophageal reflux disease) 11/28/2017  . Hepatitis A 11/28/2017  . HOH (hard of hearing) 11/28/2017  . Insomnia 11/28/2017  . Lower urinary tract symptoms (LUTS) 11/28/2017  . Mononucleosis 11/28/2017  . Neuromuscular disorder ([place]) 11/28/2017  . Neuropathy 11/28/2017  . Post-nasal drip 11/28/2017  . Sinusitis 11/28/2017  . Spinal stenosis 11/28/2017  . TB (tuberculosis) 11/28/2017  . Transfusion history 11/28/2017  . Vitamin D insufficiency 11/28/2017  . ARDS (adult respiratory distress syndrome) ([place]) 11/28/2017  . MI (myocardial infarction) ([place]) 11/28/2017  . Congestive cardiomyopathy ([place]) 11/28/2017  . Coronary artery disease 11/28/2017  . ASHD (arteriosclerotic heart disease) 11/28/2017  . DM (diabetes mellitus) ([place]) 11/28/2017  . Arthritis 11/28/2017  . RA (rheumatoid arthritis) ([place]) 11/28/2017  . Hypokalemia 11/14/2017  . Chest pain 11/13/2017  . Pure hypercholesterolemia 04/23/2017  . Chronic anticoagulation 03/21/2017  . HCAP (healthcare-associated pneumonia) 10/30/2016  . Hyponatremia 10/30/2016  . OSA on CPAP 10/30/2016  . Sepsis due to pneumonia ([place]) 10/30/2016  . Diabetes mellitus type 2, insulin dependent ([place]) 10/30/2016  .  Acute respiratory failure with hypoxia ([place])   . Angina pectoris, crescendo ([place]) 10/03/2016  . Knee effusion   . Syncope 07/20/2016  . Chronic combined systolic and diastolic CHF (congestive heart failure) ([place]) 07/19/2016  . CKD (chronic kidney disease) stage 3, GFR 30-59 ml/min (HCC) 07/19/2016  . Leukocytosis 07/19/2016  . Anemia of chronic kidney failure 07/19/2016  . Uncontrolled diabetes mellitus with diabetic nephropathy, with long-term current use of insulin ([place]) 07/19/2016  . Benign essential HTN  07/19/2016  . Dyslipidemia associated with type 2 diabetes mellitus ([place]) 07/19/2016  . Paroxysmal atrial fibrillation (H[facility])   . CAD S/P percutaneous coronary angioplasty 04/16/2016  . Rheumatoid arthritis of multiple sites with negative rheumatoid factor ([place])   . S/P lumbar fusion 04/16/2014  . Radicular pain 04/16/2014  . Lumbar stenosis 04/09/2014  . Aftercare following surgery of the musculoskeletal system, NEC 03/10/2014  . History of lumbar laminectomy for spinal cord decompression 12/29/2013  . Renal insufficiency 12/03/2013  . Herniated lumbar intervertebral disc 11/17/2013  . Spinal stenosis of lumbar region with neurogenic claudication 11/11/2013  . Pre-syncope 01/12/2013  . AKI (acute kidney injury) ([place]) 01/12/2013    Past Surgical History:  Procedure Laterality Date  . BACK SURGERY    . CARDIAC CATHETERIZATION N/A 10/03/2016   Procedure: Left Heart Cath and Coronary Angiography;  Surgeon: Belva Crome, MD;  Location: [facility];  Service: Cardiovascular;  Laterality: N/A;  . CARDIOVERSION N/A 06/04/2016   Procedure: CARDIOVERSION;  Surgeon: Skeet Latch, MD;  Location: [facility];  Service: Cardiovascular;  Laterality: N/A;  . CATARACT EXTRACTION W/ INTRAOCULAR LENS  IMPLANT, BILATERAL Bilateral 2000s  . CORONARY ANGIOPLASTY  early 6s  . CORONARY ANGIOPLASTY WITH STENT PLACEMENT     "I've got 15 stents" (07/19/2016)  . KNEE ARTHROSCOPY Left 1990s  . LEFT HEART CATH AND CORONARY ANGIOGRAPHY N/A 11/15/2017   Procedure: LEFT HEART CATH AND CORONARY ANGIOGRAPHY;  Surgeon: Jettie Booze, MD;  Location: [facility];  Service: Cardiovascular;  Laterality: N/A;  . POSTERIOR LUMBAR FUSION  2015   L4-5-S1  . right knee surgery     age of 62  . TONSILLECTOMY AND ADENOIDECTOMY  1949        Home Medications    Prior to Admission medications   Medication Sig Start Date End Date Taking? Authorizing Provider  acetaminophen (TYLENOL) 325 MG tablet  Take 650 mg by mouth every 4 (four) hours as needed for mild pain or headache.    [provider]  ALPRAZolam (NIRAVAM) 0.5 MG dissolvable tablet Take 1 tablet by mouth as needed (Take as directed).  07/26/16   [provider]  apixaban (ELIQUIS) 5 MG TABS tablet Take 1 tablet (5 mg total) by mouth 2 (two) times daily. 04/01/16   Nita Sells, MD  aspirin EC 81 MG EC tablet Take 1 tablet (81 mg total) daily by mouth. 11/18/17   Rama, Venetia Maxon, MD  carvedilol (COREG) 12.5 MG tablet Take 1 tablet (12.5 mg total) by mouth 2 (two) times daily with a meal. 04/24/18   Kilroy, Doreene Burke, PA-C  clopidogrel (PLAVIX) 75 MG tablet Take 1 tablet (75 mg total) by mouth daily. 11/28/17   Erlene Quan, PA-C  clotrimazole (LOTRIMIN) 1 % cream Apply 1 application topically 2 (two) times daily. Patient taking differently: Apply 1 application 2 (two) times daily as needed topically (dry skin).  11/06/16   Patrecia Pour, MD  Evolocumab (REPATHA SURECLICK) 254 MG/ML SOAJ Inject 140  mg into the skin every 14 (fourteen) days. 04/25/17   Skeet Latch, MD  fenofibrate (TRICOR) 145 MG tablet Take 145 mg by mouth daily.    [provider]  folic acid (FOLVITE) 1 MG tablet Take 2 mg by mouth daily.     [provider]  furosemide (LASIX) 80 MG tablet Take 1 tablet (80 mg total) by mouth 2 (two) times daily. Patient taking differently: Take 100 mg 2 (two) times daily by mouth.  10/30/17   Erlene Quan, PA-C  gabapentin (NEURONTIN) 300 MG capsule Take 300 mg by mouth at bedtime.  08/22/16   [provider]  hydrocortisone (ANUSOL-HC) 2.5 % rectal cream Place 1 application rectally 2 (two) times daily as needed for hemorrhoids or itching. 07/23/16   Robbie Lis, MD  insulin lispro protamine-lispro (HUMALOG 50/50 MIX) (50-50) 100 UNIT/ML SUSP injection Inject 40-50 Units into the skin See admin instructions. Takes 50 units in am, 40 units midday, 50 units in pm    [provider]  isosorbide mononitrate (IMDUR) 60 MG 24 hr tablet Take 1 tablet (60 mg total) daily by mouth. 11/18/17   Rama, Venetia Maxon, MD  leucovorin (WELLCOVORIN) 10 MG tablet Take 10 mg by mouth See admin instructions. Take 1 tablet (10 mg) by mouth every 12 hours and 24 hours after the methotrexate dose    [provider]  LUMIGAN 0.01 % SOLN Place 1 drop into both eyes at bedtime.  04/30/16   [provider]  magnesium oxide (MAG-OX) 400 (241.3 Mg) MG tablet Take 1 tablet (400 mg total) by mouth 2 (two) times daily. 04/01/16   Nita Sells, MD  Methotrexate Sodium (METHOTREXATE, PF,) 50 MG/2ML injection Inject 15 mg into the muscle every Friday.  03/09/16   [provider]  Misc Natural Products (OSTEO BI-FLEX ADV DOUBLE ST PO) Take 1 tablet by mouth daily as needed (takes occassionally).     [provider]  nitroGLYCERIN (NITROSTAT) 0.4 MG SL tablet PLACE 1 TABLET UNDER THE TONGUE IF NEEDED 05/14/18   Skeet Latch, MD  omega-3 acid ethyl esters (LOVAZA) 1 g capsule Take 1 capsule (1 g total) by mouth 2 (two) times daily. 12/26/17   Skeet Latch, MD  pantoprazole (PROTONIX) 40 MG tablet TAKE 1 TABLET(40 MG) BY MOUTH DAILY 05/08/18   Erlene Quan, PA-C  PARoxetine (PAXIL) 40 MG tablet Take 40 mg by mouth daily.  04/30/16   [provider]  potassium chloride SA (K-DUR,KLOR-CON) 20 MEQ tablet TAKE 2 TABLETS BY MOUTH TWICE DAILY 05/01/18   Skeet Latch, MD  predniSONE (DELTASONE) 5 MG tablet Take 5 mg by mouth daily with breakfast.    [provider]  ranolazine (RANEXA) 1000 MG SR tablet Take 1 tablet (1,000 mg total) by mouth 2 (two) times daily. 05/05/18   Skeet Latch, MD  tamsulosin (FLOMAX) 0.4 MG CAPS capsule Take 0.4 mg by mouth daily.  02/21/16   [provider]  tiZANidine (ZANAFLEX) 4 MG tablet Take 1 tablet by mouth every 8 (eight) hours as needed for muscle spasms.  05/29/16   [provider]    topiramate (TOPAMAX) 25 MG tablet Take 25 mg by mouth 2 (two) times daily.    [provider]  traMADol (ULTRAM) 50 MG tablet Take 1 tablet by mouth 3 (three) times daily as needed (pain). Take as directed 08/24/16   [provider]  VITAMIN D, ERGOCALCIFEROL, PO Take 5,000 Units by mouth daily.  [provider]  ZETIA 10 MG tablet Take 10 mg by mouth daily.  07/08/16   [provider]    Family History Family History  Problem Relation Age of Onset  . Heart failure Mother   . Hyperlipidemia Mother   . Hypertension Mother   . Heart attack Father   . Heart attack Brother     Social History Social History   Tobacco Use  . Smoking status: Never Smoker  . Smokeless tobacco: Never Used  Substance Use Topics  . Alcohol use: No  . Drug use: No     Allergies   Statins; Ace inhibitors; Contrast media [iodinated diagnostic agents]; and Penicillins   Review of Systems Review of Systems  All other systems reviewed and are negative.    Physical Exam Updated Vital Signs BP (!) 176/89   Pulse 94   Temp 100.3 F (37.9 C) (Rectal)   Resp 20   Ht 5\' 11"  (1.803 m)   Wt 91.6 kg (202 lb)   SpO2 96%   BMI 28.17 kg/m   Physical Exam  Constitutional: He appears well-developed and well-nourished. He appears distressed.  HENT:  Head: Normocephalic and atraumatic.  Cardiovascular: Regular rhythm.  No murmur heard. Tachycardic, irregular  Pulmonary/Chest: Effort normal and breath sounds normal. No respiratory distress.  Abdominal: Soft. There is no tenderness. There is no rebound and no guarding.  Musculoskeletal: He exhibits no edema or tenderness.  Neurological: He is alert.  Oriented to person.  Disoriented to place and time. Confused.  No facial asymmetry, MAE.    Skin: Skin is warm and dry.  Psychiatric: He has a normal mood and affect. His behavior is normal.  Nursing note and vitals reviewed.    ED Treatments / Results  Labs (all  labs ordered are listed, but only abnormal results are displayed) Labs Reviewed  COMPREHENSIVE METABOLIC PANEL - Abnormal; Notable for the following components:      Result Value   Potassium 2.9 (*)    Chloride 91 (*)    Glucose, Bld 385 (*)    BUN 97 (*)    Creatinine, Ser 4.55 (*)    Total Protein 9.2 (*)    AST 46 (*)    Total Bilirubin 1.8 (*)    GFR calc non Af Amer 12 (*)    GFR calc Af Amer 14 (*)    Anion gap 27 (*)    All other components within normal limits  CBC WITH DIFFERENTIAL/PLATELET - Abnormal; Notable for the following components:   WBC 17.3 (*)    Platelets 586 (*)    Neutro Abs 13.6 (*)    Monocytes Absolute 2.1 (*)    All other components within normal limits  I-STAT CHEM 8, ED - Abnormal; Notable for the following components:   Potassium 2.7 (*)    Chloride 95 (*)    BUN 73 (*)    Creatinine, Ser 4.20 (*)    Glucose, Bld 408 (*)    Calcium, Ion 1.02 (*)    All other components within normal limits  I-STAT TROPONIN, ED - Abnormal; Notable for the following components:   Troponin i, poc 0.31 (*)    All other components within normal limits  I-STAT CG4 LACTIC ACID, ED - Abnormal; Notable for the following components:   Lactic Acid, Venous 3.54 (*)    All other components within normal limits  POC OCCULT BLOOD, ED - Abnormal; Notable for the following components:   Fecal Occult Bld POSITIVE (*)  All other components within normal limits  I-STAT CG4 LACTIC ACID, ED - Abnormal; Notable for the following components:   Lactic Acid, Venous 2.64 (*)    All other components within normal limits  URINALYSIS, ROUTINE W REFLEX MICROSCOPIC  CK  CBG MONITORING, ED  TYPE AND SCREEN  ABO/RH    EKG EKG Interpretation  Date/Time:  Wednesday May 28 2018 13:55:41 EDT Ventricular Rate:  113 PR Interval:    QRS Duration: 128 QT Interval:  388 QTC Calculation: 532 R Axis:   10 Text Interpretation:  Atrial fibrillation with rapid ventricular response  Non-specific intra-ventricular conduction block Inferior infarct , age undetermined T wave abnormality, consider lateral ischemia Abnormal ECG Confirmed by Quintella Reichert [phone]) on 05/28/2018 2:42:41 PM   Radiology Ct Head Wo Contrast  Result Date: 05/28/2018 CLINICAL DATA:  Altered mental status for 3 days. History of stroke, hypertension, diabetes, hypercholesterolemia, rheumatoid arthritis. EXAM: CT HEAD WITHOUT CONTRAST TECHNIQUE: Contiguous axial images were obtained from the base of the skull through the vertex without intravenous contrast. COMPARISON:  CT HEAD July 27, 2016 FINDINGS: BRAIN: No intraparenchymal hemorrhage, mass effect nor midline shift. Moderate to severe parenchymal brain volume loss. No hydrocephalus. Patchy supratentorial white matter hypodensities less than expected for patient's age, though non-specific are most compatible with chronic small vessel ischemic disease. Small RIGHT frontal and RIGHT parietal encephalomalacia. No acute large vascular territory infarcts. No abnormal extra-axial fluid collections. Basal cisterns are patent. VASCULAR: Severe calcific atherosclerosis of the carotid siphons. SKULL: No skull fracture. No significant scalp soft tissue swelling. SINUSES/ORBITS: Severe acute on chronic paranasal sinusitis, perforated nasal septum. Status post LEFT FESS. Trace RIGHT mastoid effusion. Status post bilateral ocular lens implants. OTHER: None. IMPRESSION: 1. No acute intracranial process. 2. Moderate to severe parenchymal brain volume loss. 3. 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 4. Acute on chronic severe paranasal sinusitis. Electronically Signed   By: Elon Alas M.D.   On: 05/28/2018 16:12   Dg Chest Port 1 View  Result Date: 05/28/2018 CLINICAL DATA:  Altered mental status EXAM: PORTABLE CHEST 1 VIEW COMPARISON:  PA and lateral chest x-ray of November 13, 2017 FINDINGS: The lungs are mildly hypoinflated. There is no focal infiltrate.  The heart is top-normal in size. The pulmonary vascularity is normal. There is calcification in the wall of the aortic arch. There is no pleural effusion. IMPRESSION: There is no acute cardiopulmonary abnormality. Chronic borderline cardiomegaly. Thoracic aortic atherosclerosis. Electronically Signed   By: David  Martinique M.D.   On: 05/28/2018 15:11    Procedures Procedures (including critical care time) CRITICAL CARE Performed by: Quintella Reichert   Total critical care time: 35 minutes  Critical care time was exclusive of separately billable procedures and treating other patients.  Critical care was necessary to treat or prevent imminent or life-threatening deterioration.  Critical care was time spent personally by me on the following activities: development of treatment plan with patient and/or surrogate as well as nursing, discussions with consultants, evaluation of patient's response to treatment, examination of patient, obtaining history from patient or surrogate, ordering and performing treatments and interventions, ordering and review of laboratory studies, ordering and review of radiographic studies, pulse oximetry and re-evaluation of patient's condition.  Medications Ordered in ED Medications  ceFEPIme (MAXIPIME) 1 g in sodium chloride 0.9 % 100 mL IVPB (has no administration in time range)  vancomycin (VANCOCIN) IVPB 1000 mg/200 mL premix (has no administration in time range)  diltiazem (CARDIZEM) 100 mg in dextrose 5% 123mL (1  mg/mL) infusion (5 mg/hr Intravenous New Bag/Given 05/28/18 1500)  LORazepam (ATIVAN) injection 0.5 mg (0.5 mg Intravenous Given 05/28/18 1544)  sodium chloride 0.9 % bolus 500 mL (500 mLs Intravenous New Bag/Given 05/28/18 1544)     Initial Impression / Assessment and Plan / ED Course  I have reviewed the triage vital signs and the nursing notes.  Pertinent labs & imaging results that were available during my care of the patient were reviewed by me and  considered in my medical decision making (see chart for details).     Patient here for evaluation of altered mental status. Wife arrived to the department after initial patient evaluation. She states that he has a history of dementia and has been a little more confused over the last 4 to 6 weeks. Over the last few days he has had a significant worsening in his confusion. She states that he has new urinary incontinence and is been needing to wear depends and sleeping much more. Last night he rolled out of bed and questions were placed around him and he was placed back in the bed today. He rolled back out of bed today. He is not been complaining of pain. She does state that he is not had anything to eat or drink since yesterday and today he has only been able to say yes.   Patient ill appearing on evaluation with mild agitation, a fib with RVR. He was treated with IV fluids, Cardizem for rate control. Labs demonstrate hyperkalemia with acute renal failure, CBC with leukocytosis - similar priors. Given altered mental status, low-grade temperature, leukocytosis and elevated lactate he was treated with IV antibiotics for possible sepsis. Wife states that he has clearly expressed that he would not want aggressive measures such as intubation, tube feeds or CPR. Hospitalist consulted for admission for further treatment.  Final Clinical Impressions(s) / ED Diagnoses   Final diagnoses:  Delirium  AKI (acute kidney injury) ([facility])  Atrial fibrillation with RVR [facility])    ED Discharge Orders    None       Quintella Reichert, MD 05/28/18 1712    Quintella Reichert, MD 06/10/18 [phone]

## 2018-05-28 NOTE — ED Notes (Signed)
Small amount of stool cleaned from the pt the color was orange and appeared to have blood ixed

## 2018-05-28 NOTE — Telephone Encounter (Signed)
[facility] [employer] approved Repatha until end of year.  LMOM for patient to call [facility] and schedule delivery ASAP

## 2018-05-28 NOTE — ED Notes (Signed)
Vancomycin was infused before the pts order was discontinued

## 2018-05-28 NOTE — ED Notes (Signed)
Dr. Ralene Bathe made aware of the following critical values at 1450: Troponin 0.31 Lactic 3.54 Potassium 2.7

## 2018-05-29 DIAGNOSIS — E872 Acidosis: Secondary | ICD-10-CM

## 2018-05-29 DIAGNOSIS — E1111 Type 2 diabetes mellitus with ketoacidosis with coma: Secondary | ICD-10-CM

## 2018-05-29 DIAGNOSIS — R400 Somnolence: Secondary | ICD-10-CM

## 2018-05-29 DIAGNOSIS — E1165 Type 2 diabetes mellitus with hyperglycemia: Secondary | ICD-10-CM

## 2018-05-29 DIAGNOSIS — E118 Type 2 diabetes mellitus with unspecified complications: Secondary | ICD-10-CM

## 2018-05-29 DIAGNOSIS — M06021 Rheumatoid arthritis without rheumatoid factor, right elbow: Secondary | ICD-10-CM

## 2018-05-29 LAB — BASIC METABOLIC PANEL
Anion gap: 15 (ref 5–15)
Anion gap: 17 — ABNORMAL HIGH (ref 5–15)
BUN: 79 mg/dL — AB (ref 6–20)
BUN: 87 mg/dL — AB (ref 6–20)
CHLORIDE: 100 mmol/L — AB (ref 101–111)
CHLORIDE: 102 mmol/L (ref 101–111)
CO2: 28 mmol/L (ref 22–32)
CO2: 30 mmol/L (ref 22–32)
CREATININE: 3.13 mg/dL — AB (ref 0.61–1.24)
CREATININE: 3.41 mg/dL — AB (ref 0.61–1.24)
Calcium: 10 mg/dL (ref 8.9–10.3)
Calcium: 9.5 mg/dL (ref 8.9–10.3)
GFR calc Af Amer: 19 mL/min — ABNORMAL LOW (ref >60)
GFR calc Af Amer: 21 mL/min — ABNORMAL LOW (ref >60)
GFR calc non Af Amer: 17 mL/min — ABNORMAL LOW (ref >60)
GFR calc non Af Amer: 19 mL/min — ABNORMAL LOW (ref >60)
GLUCOSE: 159 mg/dL — AB (ref 65–99)
GLUCOSE: 186 mg/dL — AB (ref 65–99)
Potassium: 2.5 mmol/L — CL (ref 3.5–5.1)
Potassium: 3.8 mmol/L (ref 3.5–5.1)
SODIUM: 145 mmol/L (ref 135–145)
Sodium: 147 mmol/L — ABNORMAL HIGH (ref 135–145)

## 2018-05-29 LAB — URINALYSIS, ROUTINE W REFLEX MICROSCOPIC
BILIRUBIN URINE: NEGATIVE
Bacteria, UA: NONE SEEN
Glucose, UA: 150 mg/dL — AB
KETONES UR: 5 mg/dL — AB
LEUKOCYTES UA: NEGATIVE
Nitrite: NEGATIVE
PROTEIN: 100 mg/dL — AB
Specific Gravity, Urine: 1.015 (ref 1.005–1.030)
pH: 6 (ref 5.0–8.0)

## 2018-05-29 LAB — GLUCOSE, CAPILLARY
GLUCOSE-CAPILLARY: 140 mg/dL — AB (ref 65–99)
GLUCOSE-CAPILLARY: 147 mg/dL — AB (ref 65–99)
GLUCOSE-CAPILLARY: 159 mg/dL — AB (ref 65–99)
GLUCOSE-CAPILLARY: 176 mg/dL — AB (ref 65–99)
GLUCOSE-CAPILLARY: 180 mg/dL — AB (ref 65–99)
GLUCOSE-CAPILLARY: 203 mg/dL — AB (ref 65–99)
Glucose-Capillary: 159 mg/dL — ABNORMAL HIGH (ref 65–99)
Glucose-Capillary: 167 mg/dL — ABNORMAL HIGH (ref 65–99)
Glucose-Capillary: 163 mg/dL — ABNORMAL HIGH (ref 65–99)
Glucose-Capillary: 147 mg/dL — ABNORMAL HIGH (ref 65–99)
Glucose-Capillary: 139 mg/dL — ABNORMAL HIGH (ref 65–99)
Glucose-Capillary: 162 mg/dL — ABNORMAL HIGH (ref 65–99)
Glucose-Capillary: 156 mg/dL — ABNORMAL HIGH (ref 65–99)
Glucose-Capillary: 219 mg/dL — ABNORMAL HIGH (ref 65–99)
Glucose-Capillary: 226 mg/dL — ABNORMAL HIGH (ref 65–99)

## 2018-05-29 LAB — LIPID PANEL
CHOL/HDL RATIO: 5.8 ratio
CHOLESTEROL: 98 mg/dL (ref 0–200)
HDL: 17 mg/dL — ABNORMAL LOW (ref >40)
LDL Cholesterol: 64 mg/dL (ref 0–99)
Triglycerides: 87 mg/dL (ref <150)
VLDL: 17 mg/dL (ref 0–40)

## 2018-05-29 LAB — HEMOGLOBIN A1C
HEMOGLOBIN A1C: 8.3 % — AB (ref 4.8–5.6)
MEAN PLASMA GLUCOSE: 191.51 mg/dL

## 2018-05-29 LAB — MAGNESIUM: MAGNESIUM: 2.4 mg/dL (ref 1.7–2.4)

## 2018-05-29 LAB — HEPARIN LEVEL (UNFRACTIONATED): HEPARIN UNFRACTIONATED: 1.62 [IU]/mL — AB (ref 0.30–0.70)

## 2018-05-29 LAB — TROPONIN I
Troponin I: 0.12 ng/mL (ref <0.03)
Troponin I: 0.11 ng/mL (ref <0.03)
Troponin I: 0.07 ng/mL (ref <0.03)

## 2018-05-29 LAB — MRSA PCR SCREENING: MRSA by PCR: POSITIVE — AB

## 2018-05-29 LAB — APTT: APTT: 139 s — AB (ref 24–36)

## 2018-05-29 MED ORDER — HEPARIN (PORCINE) IN NACL 100-0.45 UNIT/ML-% IJ SOLN
900.0000 [IU]/h | INTRAMUSCULAR | Status: DC
  Administered 2018-05-29: 1000 [IU]/h via INTRAVENOUS
  Filled 2018-05-29: qty 250

## 2018-05-29 MED ORDER — HEPARIN BOLUS VIA INFUSION
4000.0000 [IU] | Freq: Once | INTRAVENOUS | Status: AC
  Administered 2018-05-29: 4000 [IU] via INTRAVENOUS
  Filled 2018-05-29: qty 4000

## 2018-05-29 MED ORDER — CHLORHEXIDINE GLUCONATE CLOTH 2 % EX PADS
6.0000 | MEDICATED_PAD | Freq: Every day | CUTANEOUS | Status: AC
  Administered 2018-05-29 – 2018-06-02 (×5): 6 via TOPICAL

## 2018-05-29 MED ORDER — POTASSIUM CHLORIDE 10 MEQ/100ML IV SOLN
10.0000 meq | INTRAVENOUS | Status: AC
  Administered 2018-05-29 (×5): 10 meq via INTRAVENOUS
  Filled 2018-05-29 (×5): qty 100

## 2018-05-29 MED ORDER — INSULIN GLARGINE 100 UNIT/ML ~~LOC~~ SOLN
10.0000 [IU] | Freq: Every day | SUBCUTANEOUS | Status: DC
  Administered 2018-05-29: 10 [IU] via SUBCUTANEOUS
  Filled 2018-05-29 (×2): qty 0.1

## 2018-05-29 MED ORDER — INSULIN ASPART 100 UNIT/ML ~~LOC~~ SOLN
0.0000 [IU] | SUBCUTANEOUS | Status: DC
Start: 2018-05-29 — End: 2018-06-02
  Administered 2018-05-29 – 2018-05-30 (×3): 5 [IU] via SUBCUTANEOUS
  Administered 2018-05-30 (×2): 3 [IU] via SUBCUTANEOUS
  Administered 2018-05-30: 8 [IU] via SUBCUTANEOUS
  Administered 2018-05-30 (×2): 2 [IU] via SUBCUTANEOUS
  Administered 2018-05-30: 5 [IU] via SUBCUTANEOUS
  Administered 2018-05-31 (×4): 3 [IU] via SUBCUTANEOUS
  Administered 2018-05-31: 2 [IU] via SUBCUTANEOUS
  Administered 2018-06-01 (×2): 5 [IU] via SUBCUTANEOUS
  Administered 2018-06-01: 2 [IU] via SUBCUTANEOUS
  Administered 2018-06-01 – 2018-06-02 (×3): 3 [IU] via SUBCUTANEOUS
  Administered 2018-06-02: 5 [IU] via SUBCUTANEOUS
  Administered 2018-06-02: 2 [IU] via SUBCUTANEOUS

## 2018-05-29 MED ORDER — ORAL CARE MOUTH RINSE
15.0000 mL | Freq: Two times a day (BID) | OROMUCOSAL | Status: DC
  Administered 2018-05-29 – 2018-06-01 (×8): 15 mL via OROMUCOSAL

## 2018-05-29 MED ORDER — MUPIROCIN 2 % EX OINT
1.0000 "application " | TOPICAL_OINTMENT | Freq: Two times a day (BID) | CUTANEOUS | Status: DC
  Administered 2018-05-29 – 2018-06-02 (×9): 1 via NASAL
  Filled 2018-05-29: qty 22

## 2018-05-29 NOTE — Clinical Social Work Note (Signed)
CSW acknowledges SNF consult. Please enter PT order when appropriate for evaluation.  Julian Ross, [place]

## 2018-05-29 NOTE — Progress Notes (Signed)
Initial Nutrition Assessment  DOCUMENTATION CODES:   Not applicable  INTERVENTION:   -1 packet Juven BID, each packet provides 80 calories, 8 grams of carbohydrate, and 14 grams of amino acids -MVI with minerals daily  NUTRITION DIAGNOSIS:   Inadequate oral intake related to lethargy/confusion, altered GI function as evidenced by (NPO/clear liquid diet).  GOAL:   Patient will meet greater than or equal to 90% of their needs  MONITOR:   PO intake, Supplement acceptance, Diet advancement, Labs, Weight trends, Skin, I & O's  REASON FOR ASSESSMENT:   Consult Diet education  ASSESSMENT:   72 year old man PMH diabetes mellitus, coronary artery disease, dementia, presented to the emergency department with increasing confusion, falls.  Admitted for acute kidney injury, atrial fibrillation with rapid ventricular response, DKA, acute encephalopathy.  Pt admitted with DKA and acute encephalopathy.   No family at bedside. Pt unable to provide reliable hx at time of visit. He reports he "eats all the time; good food", but unable to provide specific examples. He reports that his UBW is around 300# and has lost "between 200-300#". Pt needed to be woken up several times during assessment and often continually repeated previous answers to this RD's questions.   Reviewed wt hx; pt has experienced a 9.8% wt loss over the past 6 months, which while not significant for time frame, is concerning in the setting of uncontrolled DM. Unable to obtain a thorough DM hx at this time, however, pt with dementia and confusion, so not appropriate for education at this time. RD will be available for education at a later date for family and/or caregivers if desired.   Last A1c: 7.9 (11/14/17). PTA DM medications include 50 units insulin lispro-protamine lispro 70/30 q AM, 40 units insulin lispro-protamine lispro 70/30 q afternoon, and 50 units insulin lispro-protamine lispro 70/30 q HS.   Medications reviewed  and include prednisone.   Labs reviewed: Na: 147, K: 2.5 (on IV supplementation), CBGS: 159-180 (inpatient orders for glycemic control are10 units insulin glargine daily and glucostabliizer (IV insulin regular last dose of 2.9 units/hr).   NUTRITION - FOCUSED PHYSICAL EXAM:    Most Recent Value  Orbital Region  No depletion  Upper Arm Region  Mild depletion  Thoracic and Lumbar Region  No depletion  Buccal Region  No depletion  Temple Region  Mild depletion  Clavicle Bone Region  Mild depletion  Clavicle and Acromion Bone Region  Mild depletion  Scapular Bone Region  Unable to assess  Dorsal Hand  No depletion  Patellar Region  No depletion  Anterior Thigh Region  No depletion  Posterior Calf Region  Mild depletion  Edema (RD Assessment)  Mild  Hair  Reviewed  Eyes  Reviewed  Mouth  Reviewed  Skin  Reviewed  Nails  Reviewed       Diet Order:   Diet Order           Diet clear liquid Room service appropriate? Yes; Fluid consistency: Thin  Diet effective now          EDUCATION NEEDS:   Not appropriate for education at this time  Skin:  Skin Assessment: Reviewed RN Assessment  Last BM:  05/28/18  Height:   Ht Readings from Last 1 Encounters:  05/28/18 5\' 11"  (1.803 m)    Weight:   Wt Readings from Last 1 Encounters:  05/28/18 202 lb (91.6 kg)    Ideal Body Weight:  78.2 kg  BMI:  Body mass index is 28.17 kg/m.  Estimated Nutritional Needs:   Kcal:  2100-2300  Protein:  105-120 grams  Fluid:  2.1-2.3 L    Damary Doland A. Jimmye Norman, RD, LDN, CDE Pager: [phone] After hours Pager: [phone]

## 2018-05-29 NOTE — Progress Notes (Addendum)
Inpatient Diabetes Program Recommendations  AACE/ADA: New Consensus Statement on Inpatient Glycemic Control (2015)  Target Ranges:  Prepandial:   less than 140 mg/dL      Peak postprandial:   less than 180 mg/dL (1-2 hours)      Critically ill patients:  140 - 180 mg/dL   Lab Results  Component Value Date   GLUCAP 159 (H) 05/29/2018   HGBA1C 7.9 (H) 11/14/2017    Review of Glycemic Control Results for TOMAS, SCHAMP (MRN [id number]) as of 05/29/2018 10:34  Ref. Range 05/29/2018 00:13 05/29/2018 01:15 05/29/2018 02:21  Glucose-Capillary Latest Ref Range: 65 - 99 mg/dL 176 (H) 180 (H) 159 (H)   Diabetes history: Type 2 DM Outpatient Diabetes medications: Humalog 50/50 -50 in AM, 40 units midday, 50 units in PM Current orders for Inpatient glycemic control: Lantus 10 units QD, Novolog 0-15 units Q4H, Prednisone 5 mg QAM  Inpatient Diabetes Program Recommendations:    Preparing for transition to subQ insulin, noted orders. Recommend to give Lantus 2 hours prior to discontinuation of insulin drip. Patient was on 70 units of basal per home regimen and has received a total of 50 units of insulin in last 12 hours, thus may require more Lantus. Recommending increasing dose of Lantus to 25 units QD (91.8 kg x 28).  Additionally, noted A1C to be drawn, as last one drawn in 10/2017.   Will plan to see patient and wife today.   Thanks, Bronson Curb, MSN, RNC-OB Diabetes Coordinator [phone] (8a-5p)

## 2018-05-29 NOTE — Progress Notes (Signed)
ANTICOAGULATION CONSULT NOTE - Initial Consult  Pharmacy Consult for heparin Indication: atrial fibrillation  Allergies  Allergen Reactions  . Statins Other (See Comments)    Causes stiffness in joints   . Ace Inhibitors Cough  . Contrast Media [Iodinated Diagnostic Agents] Rash    Has to take benadryl prior to use  . Penicillins Hives and Rash    Has patient had a PCN reaction causing immediate rash, facial/tongue/throat swelling, SOB or lightheadedness with hypotension: Yes Has patient had a PCN reaction causing severe rash involving mucus membranes or skin necrosis: No Has patient had a PCN reaction that required hospitalization pt was in the hospital at time of last reaction - heart attack Has patient had a PCN reaction occurring within the last 10 years: No If all of the above answers are "NO", then may proceed with Cephalosporin use.    Patient Measurements: Height: 5\' 11"  (180.3 cm) Weight: 202 lb (91.6 kg) IBW/kg (Calculated) : 75.3 Heparin Dosing Weight: 92kg  Vital Signs: Temp: 97.8 F (36.6 C) (05/30 0340) Temp Source: Oral (05/30 0340) BP: 124/81 (05/30 0905) Pulse Rate: 77 (05/30 0905)  Labs: Recent Labs    05/28/18 1402 05/28/18 1440 05/28/18 1755 05/28/18 1918 05/28/18 2356 05/29/18 0227  HGB 15.7 16.3  --   --   --   --   HCT 46.1 48.0  --   --   --   --   PLT 586*  --   --   --   --   --   CREATININE 4.55* 4.20*  --  3.93* 3.41* 3.13*  CKTOTAL  --   --  784*  --   --   --   TROPONINI  --   --   --  0.17*  --   --     Estimated Creatinine Clearance: 25 mL/min (A) (by C-G formula based on SCr of 3.13 mg/dL (H)).   Medical History: Past Medical History:  Diagnosis Date  . Anemia   . Anxiety state   . CAD (coronary artery disease)    16 prior stents (at [facility])  . Cardiomyopathy, ischemic   . Chronic lower back pain   . Chronic systolic CHF (congestive heart failure) ([place])   . CKD (chronic kidney disease), stage III ([place])   .  Depression   . Diabetes mellitus type 2 in obese ([place])   . Fibromyalgia   . Hepatitis A    at age 67  . High cholesterol   . History of blood transfusion    "related to OR"  . Hypertension   . Hypokalemia   . MI (myocardial infarction) ([place]) 1990; 1995; 1997  . OSA on CPAP    setting = 13, full face mask  . Osteoarthritis   . Persistent atrial fibrillation ([place]) 02/2016   on Eliquis  . Rheumatoid arthritis of multiple sites with negative rheumatoid factor ([place])   . Septic shock ([place]) 02/2016   in setting of severe pneumonia   Assessment: 72 year old male with history of afib on apixaban prior to admit, this was held due to acute renal failure. Last dose was 5/27. Sq heparin was given yesterday, orders to start full dose IV heparin this afternoon.   Due to apixaban possibly still in system, will check aptt in addition to heparin levels for monitoring.   Goal of Therapy:  Heparin level 0.3-0.7 units/ml Monitor platelets by anticoagulation protocol: Yes   Plan:  Give 4000 units bolus x 1  Start heparin infusion at 1150 units/hr Check anti-Xa level in 8 hours and daily while on heparin Continue to monitor H&H and platelets  Erin Hearing PharmD., BCPS Clinical Pharmacist 05/29/2018 11:34 AM

## 2018-05-29 NOTE — Progress Notes (Signed)
PROGRESS NOTE    Julian Ross  CWC:[id number] DOB: May 03, 1946 DOA: 05/28/2018 PCP: Reynold Bowen, MD   Brief Narrative:  72 year old man WM PMHx Dementia, Anxiety, Depression, DM Type 2 uncontrolled with complication, CAD, Chronic Systolic CHF, ischemic Cardiomyopathy, HTN, Persistent Atrial Fibrillation on anticoagulation, OSA on CPAP, CKD stage III, RA with negative RF  Presented to the emergency department with increasing confusion, falls.  Admitted for acute kidney injury, atrial fibrillation with rapid ventricular response, DKA, acute encephalopathy.   Patient has long-standing dementia with acute worsening of encephalopathy over the last several days.  He is unable to provide any history.  All history obtained from wife and son at bedside.  Patient has long-standing dementia, however at baseline he is able to ambulate without assistance, eat without assistance.  He recognizes family members but short-term memory is poor.  In the last month his sleep schedule has changed, he previously would sleep 18 hours a day, but now has been staying up most the night.  Over the last few days, his oral intake is dropped to minimal, he has become more confused, unable to carry on a conversation, now with muscle jerks and has fallen out of bed several times.  He typically administers his own medications but has not done so for the last couple of days.  Typically on insulin and has suffered from hypoglycemic episodes but also hyperglycemic episodes.  Review of systems is severely limited but there is been no fever or systemic symptoms per wife.  Remainder as below.   ED Course: Tachycardic, hypertensive.  Treated with lorazepam, diltiazem infusion, normal saline.     Subjective: 5/30 A/O x1 (does not know where, when, why) answers all questions to I'm at grandma's.     Assessment & Plan:   Active Problems:   CKD (chronic kidney disease) stage 3, GFR 30-59 ml/min (HCC)   AKI (acute kidney injury)  ([place])   Atrial fibrillation with RVR (HCC)   DKA (diabetic ketoacidoses) ([place] )   Demand ischemia (HCC)  DKA/Diabetes type 2 uncontrolled with complication -Hemoglobin A1c pending -Lipid panel pending - Diabetic coordinator consult placed - Diabetic nutrition consult placed - Lantus 10 units daily.  2 hours after administration discontinue glucose stabilizer. -Moderate SSI   Altered mental status -Secondary to DKA?  If does not resolve with resolution of DKA obtain head CT.  Patient does have history of dementia but unknown baseline.  Acute on CKD stage III(baseline Cr-2.26) - Multifactorial to include poor oral intake, uncontrolled diabetes. Recent Labs  Lab 05/28/18 1402 05/28/18 1440 05/28/18 1918 05/28/18 2356 05/29/18 0227  CREATININE 4.55* 4.20* 3.93* 3.41* 3.13*      A. fib with RVR - Most likely secondary to DKA - Apixaban on (hold) secondary to renal function - Heparin per pharmacy protocol - Currently NSR - Diltiazem drip.  Will titrate patient off today. -Ranexa 1000mg  BID (hold).  Will not restart until patient off diltiazem secondary to interaction (QTC prolongation)    Demand ischemia vs NSTEMI? - EKG changes in leads I, aVL and II, III, aVF -Trend troponin - Echocardiogram pending: Cardiac catheterization 11/15/2017 showed severe three-vessel disease  Elevated lactic acid -Most likely secondary to DKA/dehydration. - Discontinue antibiotic.  If patient spikes fever or continue elevated leukocytosis panculture and then restart antibiotic.  Hypokalemia - Potassium goal> 4 - Potassium IV 50 mEq -Recheck K/Mg @1600  .   Rheumatoid Arthritis - Prednisone 5 mg daily     DVT prophylaxis: Subcu heparin Code Status: DNR Family Communication:  None Disposition Plan: TBD   Consultants:  None   Procedures/Significant Events:  Echocardiogram pending     I have personally reviewed and interpreted all radiology studies and my findings are as  above.  VENTILATOR SETTINGS:    Cultures 5/29 MRSA by PCR positive    Antimicrobials: Anti-infectives (From admission, onward)   Start     Stop   05/29/18 1800  Levofloxacin (LEVAQUIN) IVPB 250 mg  Status:  Discontinued     05/29/18 1021   05/28/18 1700  ceFEPIme (MAXIPIME) 1 g in sodium chloride 0.9 % 100 mL IVPB     05/28/18 1828   05/28/18 1630  vancomycin (VANCOCIN) IVPB 1000 mg/200 mL premix  Status:  Discontinued     05/28/18 1850       Devices   LINES / TUBES:      Continuous Infusions: . sodium chloride    . dextrose 5 % and 0.45% NaCl 125 mL/hr at 05/29/18 0729  . diltiazem (CARDIZEM) infusion 5 mg/hr (05/29/18 0320)  . insulin (NOVOLIN-R) infusion 3.2 Units/hr (05/29/18 0725)  . levofloxacin (LEVAQUIN) IV       Objective: Vitals:   05/28/18 2000 05/28/18 2327 05/29/18 0340 05/29/18 0709  BP: (!) 155/92  (!) 136/92 137/84  Pulse: 80 75 98 65  Resp:  (!) 38 18 (!) 21  Temp:  97.6 F (36.4 C) 97.8 F (36.6 C)   TempSrc:  Oral Oral   SpO2: 96% 100% 100% 100%  Weight:      Height:        Intake/Output Summary (Last 24 hours) at 05/29/2018 0734 Last data filed at 05/29/2018 0400 Gross per 24 hour  Intake 1935.65 ml  Output 100 ml  Net 1835.65 ml   Filed Weights   05/28/18 1359  Weight: 202 lb (91.6 kg)    Examination:  General: Somnolent, arousable, A/O x1 (does not know where, when, why).  Answers all questions I am at grandma's No acute respiratory distress Neck:  Negative scars, masses, torticollis, lymphadenopathy, JVD Lungs: Clear to auscultation bilaterally without wheezes or crackles Cardiovascular: Regular rate and rhythm without murmur gallop or rub normal S1 and S2 Abdomen: negative abdominal pain, nondistended, positive soft, bowel sounds, no rebound, no ascites, no appreciable mass Extremities: No significant cyanosis, clubbing, or edema bilateral lower extremities Skin: Negative rashes, lesions, ulcers Psychiatric: Unable to  assess secondary to altered mental status  Central nervous system: Spontaneously moves all extremities.  Unable to further assess secondary to altered mental status.   .     Data Reviewed: Care during the described time interval was provided by me .  I have reviewed this patient's available data, including medical history, events of note, physical examination, and all test results as part of my evaluation.   CBC: Recent Labs  Lab 05/28/18 1402 05/28/18 1440  WBC 17.3*  --   NEUTROABS 13.6*  --   HGB 15.7 16.3  HCT 46.1 48.0  MCV 87.8  --   PLT 586*  --    Basic Metabolic Panel: Recent Labs  Lab 05/28/18 1402 05/28/18 1440 05/28/18 1918 05/28/18 2356 05/29/18 0227  NA 140 139 139 145 147*  K 2.9* 2.7* 2.6* 3.8 2.5*  CL 91* 95* 93* 102 100*  CO2 22  --  25 28 30   GLUCOSE 385* 408* 389* 186* 159*  BUN 97* 73* 92* 87* 79*  CREATININE 4.55* 4.20* 3.93* 3.41* 3.13*  CALCIUM 10.3  --  9.5 9.5 10.0   GFR:  Estimated Creatinine Clearance: 25 mL/min (A) (by C-G formula based on SCr of 3.13 mg/dL (H)). Liver Function Tests: Recent Labs  Lab 05/28/18 1402  AST 46*  ALT 18  ALKPHOS 47  BILITOT 1.8*  PROT 9.2*  ALBUMIN 3.5   No results for input(s): LIPASE, AMYLASE in the last 168 hours. No results for input(s): AMMONIA in the last 168 hours. Coagulation Profile: No results for input(s): INR, PROTIME in the last 168 hours. Cardiac Enzymes: Recent Labs  Lab 05/28/18 1755 05/28/18 1918  CKTOTAL 784*  --   TROPONINI  --  0.17*   BNP (last 3 results) Recent Labs    05/05/18 0939  PROBNP 622*   HbA1C: No results for input(s): HGBA1C in the last 72 hours. CBG: Recent Labs  Lab 05/28/18 2206 05/28/18 2310 05/29/18 0013 05/29/18 0115 05/29/18 0221  GLUCAP 260* 203* 176* 180* 159*   Lipid Profile: No results for input(s): CHOL, HDL, LDLCALC, TRIG, CHOLHDL, LDLDIRECT in the last 72 hours. Thyroid Function Tests: No results for input(s): TSH, T4TOTAL, FREET4,  T3FREE, THYROIDAB in the last 72 hours. Anemia Panel: No results for input(s): VITAMINB12, FOLATE, FERRITIN, TIBC, IRON, RETICCTPCT in the last 72 hours. Urine analysis:    Component Value Date/Time   COLORURINE YELLOW 05/28/2018 1402   APPEARANCEUR CLEAR 05/28/2018 1402   LABSPEC 1.015 05/28/2018 1402   PHURINE 6.0 05/28/2018 1402   GLUCOSEU 150 (A) 05/28/2018 1402   HGBUR SMALL (A) 05/28/2018 1402   BILIRUBINUR NEGATIVE 05/28/2018 1402   KETONESUR 5 (A) 05/28/2018 1402   PROTEINUR 100 (A) 05/28/2018 1402   NITRITE NEGATIVE 05/28/2018 1402   LEUKOCYTESUR NEGATIVE 05/28/2018 1402   Sepsis Labs: @LABRCNTIP (procalcitonin:4,lacticidven:4)  ) Recent Results (from the past 240 hour(s))  MRSA PCR Screening     Status: Abnormal   Collection Time: 05/28/18  9:52 PM  Result Value Ref Range Status   MRSA by PCR POSITIVE (A) NEGATIVE Final    Comment:        The GeneXpert MRSA Assay (FDA approved for NASAL specimens only), is one component of a comprehensive MRSA colonization surveillance program. It is not intended to diagnose MRSA infection nor to guide or monitor treatment for MRSA infections. RESULT CALLED TO, READ BACK BY AND VERIFIED WITH: Royanne Foots RN 05/29/18 0425 JDW Performed at [facility], [address] [place], [place] [address]          Radiology Studies: Ct Head Wo Contrast  Result Date: 05/28/2018 CLINICAL DATA:  Altered mental status for 3 days. History of stroke, hypertension, diabetes, hypercholesterolemia, rheumatoid arthritis. EXAM: CT HEAD WITHOUT CONTRAST TECHNIQUE: Contiguous axial images were obtained from the base of the skull through the vertex without intravenous contrast. COMPARISON:  CT HEAD July 27, 2016 FINDINGS: BRAIN: No intraparenchymal hemorrhage, mass effect nor midline shift. Moderate to severe parenchymal brain volume loss. No hydrocephalus. Patchy supratentorial white matter hypodensities less than expected for patient's age, though  non-specific are most compatible with chronic small vessel ischemic disease. Small RIGHT frontal and RIGHT parietal encephalomalacia. No acute large vascular territory infarcts. No abnormal extra-axial fluid collections. Basal cisterns are patent. VASCULAR: Severe calcific atherosclerosis of the carotid siphons. SKULL: No skull fracture. No significant scalp soft tissue swelling. SINUSES/ORBITS: Severe acute on chronic paranasal sinusitis, perforated nasal septum. Status post LEFT FESS. Trace RIGHT mastoid effusion. Status post bilateral ocular lens implants. OTHER: None. IMPRESSION: 1. No acute intracranial process. 2. Moderate to severe parenchymal brain volume loss. 3. 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 4. Acute on chronic severe paranasal sinusitis. Electronically Signed   By: Elon Alas M.D.   On: 05/28/2018 16:12   Dg Chest Port 1 View  Result Date: 05/28/2018 CLINICAL DATA:  Altered mental status EXAM: PORTABLE CHEST 1 VIEW COMPARISON:  PA and lateral chest x-ray of November 13, 2017 FINDINGS: The lungs are mildly hypoinflated. There is no focal infiltrate. The heart is top-normal in size. The pulmonary vascularity is normal. There is calcification in the wall of the aortic arch. There is no pleural effusion. IMPRESSION: There is no acute cardiopulmonary abnormality. Chronic borderline cardiomegaly. Thoracic aortic atherosclerosis. Electronically Signed   By: David  Martinique M.D.   On: 05/28/2018 15:11        Scheduled Meds: . aspirin EC  81 mg Oral Daily  . clopidogrel  75 mg Oral Daily  . heparin  5,000 Units Subcutaneous Q8H  . isosorbide mononitrate  60 mg Oral Daily  . latanoprost  1 drop Both Eyes QHS  . mouth rinse  15 mL Mouth Rinse BID  . pantoprazole  40 mg Oral Daily  . predniSONE  5 mg Oral Q breakfast  . ranolazine  1,000 mg Oral BID  . sodium chloride flush  3 mL Intravenous Q12H  . tamsulosin  0.4 mg Oral Daily   Continuous Infusions: . sodium  chloride    . dextrose 5 % and 0.45% NaCl 125 mL/hr at 05/29/18 0729  . diltiazem (CARDIZEM) infusion 5 mg/hr (05/29/18 0320)  . insulin (NOVOLIN-R) infusion 3.2 Units/hr (05/29/18 0725)  . levofloxacin (LEVAQUIN) IV       LOS: 1 day    Time spent: 40 minutes    Terrilee Dudzik, Geraldo Docker, MD [facility] Pager [phone]   If 7PM-7AM, please contact night-coverage [email or web address] Password [facility] 05/29/2018, 7:34 AM

## 2018-05-29 NOTE — Consult Note (Signed)
Consultation Note Date: 05/29/2018   Patient Name: Julian Ross  DOB: 12-Apr-1946  MRN: [id number]  Age / Sex: 72 y.o., male  PCP: Julian Bowen, MD Referring Physician: Allie Bossier, MD  Reason for Consultation: Establishing goals of care  HPI/Patient Profile: 72 y.o. male admitted on 05/28/2018 from home with increased confusion and falls. He has a past medical history significant for anxiety, coronary artery disease (16 prior stents), chronic back pain, CHF, chronic kidney disease stage 3, diabetes type 2, hepatitis A (age 37), hyperlipidemia, hypertension, MI, atrial fibrillation, and dementia. During his ED course patient was unable to provide any history. His family (wife and son) were at the bedside. His family reports he has dementia, however at baseline he is able to ambulate and eat without assistance. He is able to recognize his family member, but short-term memory is poor. He was found to be tachycardic and hypertensive and was treated with lorazepam, diltiazem infusion, and normal saline. Wife reports no recent fever, bleeding, nausea, vomiting, or abdominal pain. Although generally he would sleep majority of the day and over the past week has been staying up most of the night. Chest x-ray and CT head showed no acute disease or abnormalities. EKG showed a-fib with RVR, LBBB old and probable inferior infarct old. Palliative Medicine consulted for goals of care discussion.   Clinical Assessment and Goals of Care: I have reviewed medical records including lab results, imaging, Epic notes, and MAR, received report from the bedside RN, and assessed the patient. I then met at the bedside along with wife, Julian Ross to discuss diagnosis prognosis, [place], EOL wishes, disposition and options. Julian Ross is lying in bed alert to self only. He is not able to effectively participate in goals of care discussion.   I  introduced Palliative Medicine as specialized medical care for people living with serious illness. It focuses on providing relief from the symptoms and stress of a serious illness. The goal is to improve quality of life for both the patient and the family.  We discussed a brief life review of the patient. Wife reports he is a man of [place]. They have one son who is a Pharmacist, community. He enjoys spending time with his family and being outside.   As far as functional and nutritional status wife reports "he has enough medical issues to write a medical book". She states he has a long-standing history with dementia. However, 1-2 weeks prior to admission he was functional. He was ambulatory without assistance or devices, able to feed himself, able to take his own medication. She states he was a did sleep quite often and on days he could sleep half of the day and throughout the night. Unfortunately the week prior to admission this pattern had changed and he was more awake and especially during the night. Some nights he would not sleep at all. He was also able to recognize family members, but since this illness he has been more confused. She has also noticed, over the past week, that his  appetite had decreased and would barely show interest in eating or drinking.   We discussed his current illness and what it means in the larger context of his on-going co-morbidities.  Natural disease trajectory and expectations at EOL were discussed. We discussed the process of dementia and how it relates to his current noticeable decline. Wife verbalized understanding.   I attempted to elicit values and goals of care important to the patient.    The difference between aggressive medical intervention and comfort care was considered in light of the patient's goals of care. Wife verbalized at one point she felt it was time for hospice and that on admission she was unsure if he would survive or return to baseline. However, today she  feels as if he is doing somewhat better, not at baseline but showing signs of improvement mentally and physically. She reports her initial thought several days ago was to place him in a SNF or memory care due to his status, but unless it is recommended for rehab she could possibly take him home in his current states. Her wishes for him is to continue to treat the treatable, with hopes of improvement and return to baseline status.   Advanced directives, concepts specific to code status, artifical feeding and hydration, and rehospitalization were considered and discussed. He is a DNR/DNI. Wife feels that artificial feeding such as PEG would not be appropriate and he would most likely pull the tube out if it came to that point.   Hospice and Palliative Care services outpatient were explained and offered. Wife states she is not prepared to initiate hospice services, however she is in agreement with outpatient palliative services for extra support. Her main wish is for him not to suffer, and to feel as good as he can for as long as he can. She feels if he does return to baseline, he is manageable in the home.   Questions and concerns were addressed. Wife requested literature to review in regards to our conversation and the role of palliative. Hard Choices booklet left for review. The family was encouraged to call with questions or concerns.  PMT will continue to support holistically.  Primary Decision Maker: HCPOA-Wife, Julian Ross     SUMMARY OF RECOMMENDATIONS    DNR/DNI per wife  Continue to treat the treatable. Wife feels he is slowly recovering and showing signs of returning to his baseline. She is hopeful this will happen and he will be able to return home soon, whether it be with or without SNF for rehab. She states he is more alert today and able to communicate with her. She is in agreement with outpatient Palliative service versus hospice services.   Would recommend PT eval for guidance with  discharge planning and appropriateness.   Case Manager consult for outpatient palliative services at discharge.   Palliative Medicine will continue to support patient, family, and medical team during hospitalization as needed.   Code Status/Advance Care Planning:  DNR /DNI per previous documentation and per wife.   Palliative Prophylaxis:   Aspiration, Bowel Regimen, Delirium Protocol, Frequent Pain Assessment and Oral Care  Additional Recommendations (Limitations, Scope, Preferences):  Full Scope Treatment-continue to treat the treatable at wife's request.   Psycho-social/Spiritual:   Desire for further Chaplaincy support:NO   Prognosis:   Unable to determine-guarded to poor in the setting of advanced dementia, altered sleep pattern, decrease in po intake, uncontrolled diabetes, CAD, CHF, persistent a-fib, CKD stage 3, decreased mobility.   Discharge Planning: To Be  Determined Outpatient palliative services at discharge.      Primary Diagnoses: Present on Admission: . Atrial fibrillation with RVR ([place]) . AKI (acute kidney injury) ([place]) . CKD (chronic kidney disease) stage 3, GFR 30-59 ml/min ([facility])   I have reviewed the medical record, interviewed the patient and family, and examined the patient. The following aspects are pertinent.  Past Medical History:  Diagnosis Date  . Anemia   . Anxiety state   . CAD (coronary artery disease)    16 prior stents (at [facility])  . Cardiomyopathy, ischemic   . Chronic lower back pain   . Chronic systolic CHF (congestive heart failure) ([place])   . CKD (chronic kidney disease), stage III ([place])   . Depression   . Diabetes mellitus type 2 in obese ([place])   . Fibromyalgia   . Hepatitis A    at age 77  . High cholesterol   . History of blood transfusion    "related to OR"  . Hypertension   . Hypokalemia   . MI (myocardial infarction) ([place]) 1990; 1995; 1997  . OSA on CPAP    setting = 13, full face mask  . Osteoarthritis    . Persistent atrial fibrillation ([place]) 02/2016   on Eliquis  . 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 Septic shock ([place]) 02/2016   in setting of severe pneumonia   Social History   Socioeconomic History  . Marital status: Married    Spouse name: Not on file  . Number of children: Not on file  . Years of education: Not on file  . Highest education level: Not on file  Occupational History  . Occupation: retired Pharmacist, community  Social Needs  . Financial resource strain: Not on file  . Food insecurity:    Worry: Not on file    Inability: Not on file  . Transportation needs:    Medical: Not on file    Non-medical: Not on file  Tobacco Use  . Smoking status: Never Smoker  . Smokeless tobacco: Never Used  Substance and Sexual Activity  . Alcohol use: No  . Drug use: No  . Sexual activity: Not Currently  Lifestyle  . Physical activity:    Days per week: Not on file    Minutes per session: Not on file  . Stress: Not on file  Relationships  . Social connections:    Talks on phone: Not on file    Gets together: Not on file    Attends religious service: Not on file    Active member of club or organization: Not on file    Attends meetings of clubs or organizations: Not on file    Relationship status: Not on file  Other Topics Concern  . Not on file  Social History Narrative   [employer], Dentist retired on disability.  Lived in [place] but recently moved to [place].  6 sons, 21 grandchildren.   Family History  Problem Relation Age of Onset  . Heart failure Mother   . Hyperlipidemia Mother   . Hypertension Mother   . Heart attack Father   . Heart attack Brother    Scheduled Meds: . aspirin EC  81 mg Oral Daily  . Chlorhexidine Gluconate Cloth  6 each Topical Q0600  . clopidogrel  75 mg Oral Daily  . insulin aspart  0-15 Units Subcutaneous Q4H  . insulin glargine  10 Units Subcutaneous Daily  . isosorbide mononitrate  60 mg  Oral  Daily  . latanoprost  1 drop Both Eyes QHS  . mouth rinse  15 mL Mouth Rinse BID  . mupirocin ointment  1 application Nasal BID  . pantoprazole  40 mg Oral Daily  . predniSONE  5 mg Oral Q breakfast  . sodium chloride flush  3 mL Intravenous Q12H  . tamsulosin  0.4 mg Oral Daily   Continuous Infusions: . sodium chloride 125 mL/hr at 05/29/18 1237  . dextrose 5 % and 0.45% NaCl Stopped (05/29/18 0948)  . diltiazem (CARDIZEM) infusion 5 mg/hr (05/29/18 0320)  . heparin 1,000 Units/hr (05/29/18 1217)  . potassium chloride 10 mEq (05/29/18 1321)   PRN Meds:.acetaminophen **OR** acetaminophen, ALPRAZolam, nitroGLYCERIN Medications Prior to Admission:  Prior to Admission medications   Medication Sig Start Date End Date Taking? Authorizing Provider  ALPRAZolam (NIRAVAM) 0.5 MG dissolvable tablet Take 1 tablet by mouth as needed (Take as directed).  07/26/16  Yes [provider]  apixaban (ELIQUIS) 5 MG TABS tablet Take 1 tablet (5 mg total) by mouth 2 (two) times daily. 04/01/16  Yes Nita Sells, MD  aspirin EC 81 MG EC tablet Take 1 tablet (81 mg total) daily by mouth. 11/18/17  Yes Rama, Venetia Maxon, MD  carvedilol (COREG) 12.5 MG tablet Take 1 tablet (12.5 mg total) by mouth 2 (two) times daily with a meal. 04/24/18  Yes Kilroy, Luke K, PA-C  furosemide (LASIX) 80 MG tablet Take 1 tablet (80 mg total) by mouth 2 (two) times daily. Patient taking differently: Take 100 mg 2 (two) times daily by mouth.  10/30/17  Yes Kilroy, Luke K, PA-C  isosorbide mononitrate (IMDUR) 60 MG 24 hr tablet Take 1 tablet (60 mg total) daily by mouth. Patient taking differently: Take 30 mg by mouth daily.  11/18/17  Yes Rama, Venetia Maxon, MD  leucovorin (WELLCOVORIN) 10 MG tablet Take 10 mg by mouth See admin instructions. Take 1 tablet (10 mg) by mouth every 12 hours and 24 hours after the methotrexate dose   Yes [provider]  nitroGLYCERIN (NITROSTAT) 0.4 MG SL tablet PLACE 1 TABLET  UNDER THE TONGUE IF NEEDED 05/14/18  Yes Skeet Latch, MD  omega-3 acid ethyl esters (LOVAZA) 1 g capsule Take 1 capsule (1 g total) by mouth 2 (two) times daily. 12/26/17  Yes Skeet Latch, MD  pantoprazole (PROTONIX) 40 MG tablet TAKE 1 TABLET(40 MG) BY MOUTH DAILY 05/08/18  Yes Kilroy, Luke K, PA-C  PARoxetine (PAXIL) 40 MG tablet Take 40 mg by mouth daily.  04/30/16  Yes [provider]  potassium chloride SA (K-DUR,KLOR-CON) 20 MEQ tablet TAKE 2 TABLETS BY MOUTH TWICE DAILY 05/01/18  Yes Skeet Latch, MD  predniSONE (DELTASONE) 5 MG tablet Take 5 mg by mouth daily with breakfast.   Yes [provider]  ranolazine (RANEXA) 1000 MG SR tablet Take 1 tablet (1,000 mg total) by mouth 2 (two) times daily. 05/05/18  Yes Skeet Latch, MD  tamsulosin (FLOMAX) 0.4 MG CAPS capsule Take 0.4 mg by mouth daily.  02/21/16  Yes [provider]  tiZANidine (ZANAFLEX) 4 MG tablet Take 1 tablet by mouth every 8 (eight) hours as needed for muscle spasms.  05/29/16  Yes [provider]  topiramate (TOPAMAX) 25 MG tablet Take 25 mg by mouth 2 (two) times daily.   Yes [provider]  traMADol (ULTRAM) 50 MG tablet Take 1 tablet by mouth 3 (three) times daily as needed (pain). Take as directed 08/24/16  Yes [provider]  acetaminophen (TYLENOL) 325 MG tablet Take 650 mg by mouth every 4 (four) hours as needed for mild pain or headache.    [provider]  clopidogrel (PLAVIX) 75 MG tablet Take 1 tablet (75 mg total) by mouth daily. 11/28/17   Erlene Quan, PA-C  clotrimazole (LOTRIMIN) 1 % cream Apply 1 application topically 2 (two) times daily. Patient taking differently: Apply 1 application topically 2 (two) times daily as needed (dry skin).  11/06/16   Patrecia Pour, MD  Evolocumab (REPATHA SURECLICK) 161 MG/ML SOAJ Inject 140 mg into the skin every 14 (fourteen) days. 04/25/17   Skeet Latch, MD  fenofibrate (TRICOR) 145 MG tablet Take  145 mg by mouth daily.    [provider]  folic acid (FOLVITE) 1 MG tablet Take 2 mg by mouth daily.     [provider]  gabapentin (NEURONTIN) 300 MG capsule Take 300 mg by mouth at bedtime.  08/22/16   [provider]  hydrocortisone (ANUSOL-HC) 2.5 % rectal cream Place 1 application rectally 2 (two) times daily as needed for hemorrhoids or itching. 07/23/16   Robbie Lis, MD  insulin lispro protamine-lispro (HUMALOG 50/50 MIX) (50-50) 100 UNIT/ML SUSP injection Inject 40-50 Units into the skin See admin instructions. Takes 50 units in am, 40 units midday, 50 units in pm    [provider]  LUMIGAN 0.01 % SOLN Place 1 drop into both eyes at bedtime.  04/30/16   [provider]  magnesium oxide (MAG-OX) 400 (241.3 Mg) MG tablet Take 1 tablet (400 mg total) by mouth 2 (two) times daily. 04/01/16   Nita Sells, MD  Methotrexate Sodium (METHOTREXATE, PF,) 50 MG/2ML injection Inject 15 mg into the muscle every Friday.  03/09/16   [provider]  Misc Natural Products (OSTEO BI-FLEX ADV DOUBLE ST PO) Take 1 tablet by mouth daily as needed (takes occassionally).     [provider]  VITAMIN D, ERGOCALCIFEROL, PO Take 5,000 Units by mouth daily.     [provider]  ZETIA 10 MG tablet Take 10 mg by mouth daily.  07/08/16   [provider]   Allergies  Allergen Reactions  . Statins Other (See Comments)    Causes stiffness in joints   . Ace Inhibitors Cough  . Contrast Media [Iodinated Diagnostic Agents] Rash    Has to take benadryl prior to use  . Penicillins Hives and Rash    Has patient had a PCN reaction causing immediate rash, facial/tongue/throat swelling, SOB or lightheadedness with hypotension: Yes Has patient had a PCN reaction causing severe rash involving mucus membranes or skin necrosis: No Has patient had a PCN reaction that required hospitalization pt was in the hospital at time of last reaction - heart  attack Has patient had a PCN reaction occurring within the last 10 years: No If all of the above answers are "NO", then may proceed with Cephalosporin use.   Review of Systems  Unable to perform ROS: Dementia  Does not endorse pain or discomfort.   Physical Exam  Constitutional: Vital signs are normal. He appears well-developed. He appears ill.  Cardiovascular: Normal rate, regular rhythm, normal heart sounds, intact distal pulses and normal pulses.  Pulmonary/Chest: Effort normal. He has decreased breath sounds.  Musculoskeletal:  Moves all extremities   Neurological: He is alert.  Alert to self only, hx of dementia   Psychiatric: Cognition and memory are impaired. He expresses inappropriate judgment.  Nursing note and vitals reviewed.  Vital Signs: BP 119/79   Pulse 86   Temp 97.9 F (36.6 C) (Oral)   Resp 20   Ht _0  (1.803 m)   Wt 91.6 kg (202 lb)   SpO2 100%   BMI 28.17 kg/m  Pain Scale: PAINAD   Pain Score: Asleep   SpO2: SpO2: 100 % O2 Device:SpO2: 100 % O2 Flow Rate: .O2 Flow Rate (L/min): 2 L/min  IO: Intake/output summary:   Intake/Output Summary (Last 24 hours) at 05/29/2018 1416 Last data filed at 05/29/2018 0904 Gross per 24 hour  Intake 2604.23 ml  Output 300 ml  Net 2304.23 ml    LBM: Last BM Date: 05/28/18 Baseline Weight: Weight: 91.6 kg (202 lb) Most recent weight: Weight: 91.6 kg (202 lb)     Palliative Assessment/Data:PPS 40%    Time In: 1330 Time Out: 1430 Time Total: 60 min.  Greater than 50%  of this time was spent counseling and coordinating care related to the above assessment and plan.  Signed by: Alda Lea, NP-BC Palliative Medicine Team  Phone: [phone] Fax: [phone]   Please contact Palliative Medicine Team phone at [phone] for questions and concerns.  For individual provider: See Shea Evans

## 2018-05-29 NOTE — Progress Notes (Signed)
Noted plans to titrate off cardizem per MD note today.  Pt still on cardizem drip at 5mg /hr.  Dr. Sherral Hammers called for clarification on cardizem drip.  OK to d/c drip at this time due to HR being rate controlled.  HR 60's-80's afib with BP 109/68.

## 2018-05-30 ENCOUNTER — Inpatient Hospital Stay (HOSPITAL_COMMUNITY): Payer: Medicare Other

## 2018-05-30 ENCOUNTER — Inpatient Hospital Stay (HOSPITAL_COMMUNITY): Admission: RE | Admit: 2018-05-30 | Payer: Medicare Other | Source: Ambulatory Visit

## 2018-05-30 DIAGNOSIS — I371 Nonrheumatic pulmonary valve insufficiency: Secondary | ICD-10-CM

## 2018-05-30 DIAGNOSIS — R410 Disorientation, unspecified: Secondary | ICD-10-CM

## 2018-05-30 DIAGNOSIS — Z7189 Other specified counseling: Secondary | ICD-10-CM

## 2018-05-30 DIAGNOSIS — Z515 Encounter for palliative care: Secondary | ICD-10-CM

## 2018-05-30 DIAGNOSIS — I340 Nonrheumatic mitral (valve) insufficiency: Secondary | ICD-10-CM

## 2018-05-30 LAB — GLUCOSE, CAPILLARY
GLUCOSE-CAPILLARY: 141 mg/dL — AB (ref 65–99)
GLUCOSE-CAPILLARY: 165 mg/dL — AB (ref 65–99)
GLUCOSE-CAPILLARY: 201 mg/dL — AB (ref 65–99)
GLUCOSE-CAPILLARY: 269 mg/dL — AB (ref 65–99)
Glucose-Capillary: 141 mg/dL — ABNORMAL HIGH (ref 65–99)
Glucose-Capillary: 182 mg/dL — ABNORMAL HIGH (ref 65–99)
Glucose-Capillary: 240 mg/dL — ABNORMAL HIGH (ref 65–99)

## 2018-05-30 LAB — BASIC METABOLIC PANEL
Anion gap: 9 (ref 5–15)
BUN: 48 mg/dL — AB (ref 6–20)
CALCIUM: 8.5 mg/dL — AB (ref 8.9–10.3)
CO2: 29 mmol/L (ref 22–32)
Chloride: 103 mmol/L (ref 101–111)
Creatinine, Ser: 1.86 mg/dL — ABNORMAL HIGH (ref 0.61–1.24)
GFR calc Af Amer: 40 mL/min — ABNORMAL LOW (ref >60)
GFR calc non Af Amer: 35 mL/min — ABNORMAL LOW (ref >60)
GLUCOSE: 141 mg/dL — AB (ref 65–99)
Potassium: 2.8 mmol/L — ABNORMAL LOW (ref 3.5–5.1)
SODIUM: 141 mmol/L (ref 135–145)

## 2018-05-30 LAB — HEPARIN LEVEL (UNFRACTIONATED): HEPARIN UNFRACTIONATED: 1.44 [IU]/mL — AB (ref 0.30–0.70)

## 2018-05-30 LAB — MAGNESIUM: MAGNESIUM: 2.2 mg/dL (ref 1.7–2.4)

## 2018-05-30 LAB — CBC
HCT: 34.3 % — ABNORMAL LOW (ref 39.0–52.0)
Hemoglobin: 11.2 g/dL — ABNORMAL LOW (ref 13.0–17.0)
MCH: 29.7 pg (ref 26.0–34.0)
MCHC: 32.7 g/dL (ref 30.0–36.0)
MCV: 91 fL (ref 78.0–100.0)
PLATELETS: 349 10*3/uL (ref 150–400)
RBC: 3.77 MIL/uL — ABNORMAL LOW (ref 4.22–5.81)
RDW: 13.6 % (ref 11.5–15.5)
WBC: 12.4 10*3/uL — AB (ref 4.0–10.5)

## 2018-05-30 LAB — APTT: APTT: 85 s — AB (ref 24–36)

## 2018-05-30 LAB — ECHOCARDIOGRAM COMPLETE
HEIGHTINCHES: 71 in
WEIGHTICAEL: 3232 [oz_av]

## 2018-05-30 MED ORDER — POTASSIUM CHLORIDE CRYS ER 20 MEQ PO TBCR: 40.0000 meq | EXTENDED_RELEASE_TABLET | Freq: Two times a day (BID) | ORAL | Status: DC

## 2018-05-30 MED ORDER — RANOLAZINE ER 500 MG PO TB12
1000.0000 mg | ORAL_TABLET | Freq: Two times a day (BID) | ORAL | Status: DC
  Administered 2018-05-30 – 2018-06-02 (×7): 1000 mg via ORAL
  Filled 2018-05-30 (×7): qty 2

## 2018-05-30 MED ORDER — POTASSIUM CHLORIDE 10 MEQ/100ML IV SOLN
10.0000 meq | INTRAVENOUS | Status: AC
  Administered 2018-05-30 (×5): 10 meq via INTRAVENOUS
  Filled 2018-05-30 (×5): qty 100

## 2018-05-30 MED ORDER — INSULIN GLARGINE 100 UNIT/ML ~~LOC~~ SOLN
18.0000 [IU] | Freq: Every day | SUBCUTANEOUS | Status: DC
  Administered 2018-05-30 – 2018-06-02 (×4): 18 [IU] via SUBCUTANEOUS
  Filled 2018-05-30 (×4): qty 0.18

## 2018-05-30 MED ORDER — APIXABAN 5 MG PO TABS
5.0000 mg | ORAL_TABLET | Freq: Two times a day (BID) | ORAL | Status: DC
  Administered 2018-05-30 – 2018-06-02 (×7): 5 mg via ORAL
  Filled 2018-05-30 (×7): qty 1

## 2018-05-30 NOTE — Progress Notes (Signed)
Daily Progress Note   Patient Name: Julian Ross       Date: 05/30/2018 DOB: 05/06/1946  Age: 72 y.o. MRN#: [id number] Attending Physician: Allie Bossier, MD Primary Care Physician: Reynold Bowen, MD Admit Date: 05/28/2018  Reason for Consultation/Follow-up: Establishing goals of care  Subjective: Patient is out of bed in chair this morning. He is alert to self and knows that he is in a hospital. Continues to state "when is he going home". No family at bedside. Denies pain or discomfort.  Spoke to his wife, [name] over the phone. We reviewed our goals of care conversation on yesterday. She verbalizes at this time she felt it was necessary for him to be placed at SNF due to lack of 24/7 care and debility. She most recently had a knee replacement herself. She states she has spoken to her son and they are in agreement. Wife also verbalized their wishes for outpatient palliative services with understanding that hospice services are available in the future when needed.   Chart Reviewed.   Length of Stay: 2  Current Medications: Scheduled Meds:  . aspirin EC  81 mg Oral Daily  . Chlorhexidine Gluconate Cloth  6 each Topical Q0600  . clopidogrel  75 mg Oral Daily  . insulin aspart  0-15 Units Subcutaneous Q4H  . insulin glargine  18 Units Subcutaneous Daily  . isosorbide mononitrate  60 mg Oral Daily  . latanoprost  1 drop Both Eyes QHS  . mouth rinse  15 mL Mouth Rinse BID  . mupirocin ointment  1 application Nasal BID  . pantoprazole  40 mg Oral Daily  . predniSONE  5 mg Oral Q breakfast  . sodium chloride flush  3 mL Intravenous Q12H  . tamsulosin  0.4 mg Oral Daily   Continuous Infusions: . sodium chloride 125 mL/hr at 05/29/18 2151  . dextrose 5 % and 0.45% NaCl Stopped (05/29/18  0948)  . diltiazem (CARDIZEM) infusion Stopped (05/29/18 1711)  . heparin 900 Units/hr (05/30/18 0015)  . potassium chloride 10 mEq (05/30/18 1029)    PRN Meds: acetaminophen **OR** acetaminophen, ALPRAZolam, nitroGLYCERIN  Physical Exam   Constitutional: Vital signs are normal. He appears well-developed. He appears ill.  Cardiovascular: Normal rate, regular rhythm, normal heart sounds, intact distal pulses and normal pulses.  Pulmonary/Chest: Effort normal. He has  decreased breath sounds.  Musculoskeletal:  Moves all extremities   Neurological: He is alert.  Alert to self only, hx of dementia   Psychiatric: Cognition and memory are impaired. He expresses inappropriate judgment.  Nursing note and vitals reviewed.  Vital Signs: BP (!) 141/76 (BP Location: Left Arm)   Pulse (!) 107   Temp (!) 97.5 F (36.4 C) (Oral)   Resp 14   Ht 5\' 11"  (1.803 m)   Wt 91.6 kg (202 lb)   SpO2 93%   BMI 28.17 kg/m  SpO2: SpO2: 93 % O2 Device: O2 Device: Room Air O2 Flow Rate: O2 Flow Rate (L/min): 2 L/min  Intake/output summary:   Intake/Output Summary (Last 24 hours) at 05/30/2018 1034 Last data filed at 05/30/2018 0800 Gross per 24 hour  Intake 2767.07 ml  Output 400 ml  Net 2367.07 ml   LBM: Last BM Date: 05/29/18 Baseline Weight: Weight: 91.6 kg (202 lb) Most recent weight: Weight: 91.6 kg (202 lb)       Palliative Assessment/Data:PPS 40%   Patient Active Problem List   Diagnosis Date Noted  . Atrial fibrillation with RVR ([place]) 05/28/2018  . DKA (diabetic ketoacidoses) ([place]) 05/28/2018  . Demand ischemia ([place]) 05/28/2018  . Abscess 11/28/2017  . Anemia 11/28/2017  . Anxiety 11/28/2017  . Ischemic cardiomyopathy 11/28/2017  . Chronic back pain 11/28/2017  . Depression 11/28/2017  . DOE (dyspnea on exertion) 11/28/2017  . Leg edema 11/28/2017  . Essential tremor 11/28/2017  . GERD (gastroesophageal reflux disease) 11/28/2017  . Hepatitis A 11/28/2017  . HOH (hard of  hearing) 11/28/2017  . Insomnia 11/28/2017  . Lower urinary tract symptoms (LUTS) 11/28/2017  . Mononucleosis 11/28/2017  . Neuromuscular disorder ([place]) 11/28/2017  . Neuropathy 11/28/2017  . Post-nasal drip 11/28/2017  . Sinusitis 11/28/2017  . Spinal stenosis 11/28/2017  . TB (tuberculosis) 11/28/2017  . Transfusion history 11/28/2017  . Vitamin D insufficiency 11/28/2017  . ARDS (adult respiratory distress syndrome) ([place]) 11/28/2017  . MI (myocardial infarction) ([place]) 11/28/2017  . Congestive cardiomyopathy ([place]) 11/28/2017  . Coronary artery disease 11/28/2017  . ASHD (arteriosclerotic heart disease) 11/28/2017  . DM (diabetes mellitus) ([place]) 11/28/2017  . Arthritis 11/28/2017  . RA (rheumatoid arthritis) ([place]) 11/28/2017  . Hypokalemia 11/14/2017  . Chest pain 11/13/2017  . Pure hypercholesterolemia 04/23/2017  . Chronic anticoagulation 03/21/2017  . HCAP (healthcare-associated pneumonia) 10/30/2016  . Hyponatremia 10/30/2016  . OSA on CPAP 10/30/2016  . Sepsis due to pneumonia ([place]) 10/30/2016  . Diabetes mellitus type 2, insulin dependent ([place]) 10/30/2016  . Acute respiratory failure with hypoxia ([place])   . Angina pectoris, crescendo ([place]) 10/03/2016  . Knee effusion   . Syncope 07/20/2016  . Chronic combined systolic and diastolic CHF (congestive heart failure) ([place]) 07/19/2016  . CKD (chronic kidney disease) stage 3, GFR 30-59 ml/min ([facility]) 07/19/2016  . Leukocytosis 07/19/2016  . Anemia of chronic kidney failure 07/19/2016  . Uncontrolled diabetes mellitus with diabetic nephropathy, with long-term current use of insulin ([place]) 07/19/2016  . Benign essential HTN 07/19/2016  . Dyslipidemia associated with type 2 diabetes mellitus ([place]) 07/19/2016  . Paroxysmal atrial fibrillation ([facility])   . CAD S/P percutaneous coronary angioplasty 04/16/2016  . Rheumatoid arthritis of multiple sites with negative rheumatoid factor ([place])   . S/P lumbar fusion 04/16/2014  . Radicular  pain 04/16/2014  . Lumbar stenosis 04/09/2014  . Aftercare following surgery of the musculoskeletal system, NEC 03/10/2014  . History of lumbar laminectomy for spinal cord decompression 12/29/2013  .  Renal insufficiency 12/03/2013  . Herniated lumbar intervertebral disc 11/17/2013  . Spinal stenosis of lumbar region with neurogenic claudication 11/11/2013  . Pre-syncope 01/12/2013  . AKI (acute kidney injury) ([place]) 01/12/2013    Palliative Care Assessment & Plan   Patient Profile: 72 y.o. male admitted on 05/28/2018 from home with increased confusion and falls. He has a past medical history significant for anxiety, coronary artery disease (16 prior stents), chronic back pain, CHF, chronic kidney disease stage 3, diabetes type 2, hepatitis A (age 63), hyperlipidemia, hypertension, MI, atrial fibrillation, and dementia. During his ED course patient was unable to provide any history. His family (wife and son) were at the bedside. His family reports he has dementia, however at baseline he is able to ambulate and eat without assistance. He is able to recognize his family member, but short-term memory is poor. He was found to be tachycardic and hypertensive and was treated with lorazepam, diltiazem infusion, and normal saline. Wife reports no recent fever, bleeding, nausea, vomiting, or abdominal pain. Although generally he would sleep majority of the day and over the past week has been staying up most of the night. Chest x-ray and CT head showed no acute disease or abnormalities. EKG showed a-fib with RVR, LBBB old and probable inferior infarct old. Palliative Medicine consulted for goals of care discussion.   Recommendations/Plan:  DNR/DNI per wife  Continue to treat the treatable. Wife feels he is slowly recovering and showing signs of returning to his baseline. She is hopeful this will happen and he will be able to return home soon, however, at this time she and her sons are unable to care for him  24/7 given his current deconditioning. She feels he would be most appropriately cared for at [facility] She is in agreement with outpatient Palliative service versus hospice services.    Case Manager consult for outpatient palliative services at discharge.   Palliative Medicine will continue to support patient, family, and medical team during hospitalization as needed.   Goals of Care and Additional Recommendations:  Limitations on Scope of Treatment: Full Scope Treatment  Code Status:    Code Status Orders  (From admission, onward)        Start     Ordered   05/28/18 1817  Do not attempt resuscitation (DNR)  Continuous    Question Answer Comment  In the event of cardiac or respiratory ARREST Do not call a "code blue"   In the event of cardiac or respiratory ARREST Do not perform Intubation, CPR, defibrillation or ACLS   In the event of cardiac or respiratory ARREST Use medication by any route, position, wound care, and other measures to relive pain and suffering. May use oxygen, suction and manual treatment of airway obstruction as needed for comfort.      05/28/18 1818    Code Status History    Date Active Date Inactive Code Status Order ID Comments User Context   05/28/2018 1816 05/28/2018 1818 DNR [id number]  Samuella Cota, MD ED   05/28/2018 1637 05/28/2018 1815 DNR [id number]  Quintella Reichert, MD ED  Advance Directive Documentation     Most Recent Value  Type of Advance Directive  Living will, Healthcare Power of Attorney  Pre-existing out of facility DNR order (yellow form or pink MOST form)  -  "MOST" Form in Place?  -      Prognosis:   Unable to determine guarded to poor in the setting of advanced dementia, altered sleep pattern, decrease in  po intake, uncontrolled diabetes, CAD, CHF, persistent a-fib, CKD stage 3, decreased mobility.   Discharge Planning:  [facility] for rehab with Palliative care service follow-up  Care plan was discussed with  patient's wife, bedside RN, Case Manager.   Thank you for allowing the Palliative Medicine Team to assist in the care of this patient.   Total Time 35 min Prolonged Time Billed  NO      Greater than 50%  of this time was spent counseling and coordinating care related to the above assessment and plan.  Alda Lea, NP-BC Palliative Medicine Team  Phone: [phone] Fax: [phone]  Please contact Palliative Medicine Team phone at [phone] for questions and concerns.

## 2018-05-30 NOTE — Progress Notes (Addendum)
Inpatient Diabetes Program Recommendations  AACE/ADA: New Consensus Statement on Inpatient Glycemic Control (2015)  Target Ranges:  Prepandial:   less than 140 mg/dL      Peak postprandial:   less than 180 mg/dL (1-2 hours)      Critically ill patients:  140 - 180 mg/dL   Lab Results  Component Value Date   GLUCAP 141 (H) 05/30/2018   HGBA1C 8.3 (H) 05/29/2018    Review of Glycemic Control Results for Julian Ross, Julian Ross (MRN [id number]) as of 05/30/2018 08:41  Ref. Range 05/29/2018 23:58 05/30/2018 04:14 05/30/2018 07:42  Glucose-Capillary Latest Ref Range: 65 - 99 mg/dL 182 (H) 141 (H) 141 (H)   Diabetes history: Type 2 DM Outpatient Diabetes medications: Humalog 50/50 -50 in AM, 40 units midday, 50 units in PM Current orders for Inpatient glycemic control: Lantus 10 units QD, Novolog 0-15 units Q4H, Prednisone 5 mg QAM  Inpatient Diabetes Program Recommendations:    Based on needs in last 24 hours, recommending increasing dose of Lantus to 18 units QD (91.8 kg x 0.2).   Addendum@ 1150: Spoke with wife by phone regarding outpatient plan for discharge. Wife recently had knee replacement surgery and will be unable to care for patient 24 hours a day and is unable to lift patient. SNF seem most appropriate at this time. When discussing option wife states patient has been using insulin pens, but that it costs close to $700 per month. Interested in cheaper options, which are available in vial and syringe form. The wife is able to perform injections twice a day, does not for see issues with drawing up insulin and administration, but will need instruction in the event SNF is not approved etc. Discussed plan with case manager, Aldona Bar.  Noted MD note regarding discharge plan to include basal/bolus regimen. Per wife this may be difficult for wife to perform in the event patient returns home.  Patient has a meter and test strips. Encouraged if appropriate to check 2-3 times a day and follow up with Dr  Forde Dandy.  Will continue to follow for discharge recommendations based on disposition.   Thanks, Bronson Curb, MSN, RNC-OB Diabetes Coordinator [phone] (8a-5p)

## 2018-05-30 NOTE — Evaluation (Signed)
Occupational Therapy Evaluation Patient Details Name: Julian Ross MRN: [id number] DOB: 1946-04-17 Today's Date: 05/30/2018    History of Present Illness 72 yo admitted with falls and confusion. PMHx: anxiety, CAD, RA, chronic back pain, CHF, CKD, DM, HLD, HTN, atrial fibrillation, and dementia   Clinical Impression   Pt reports he was independent with ADL PTA. Currently pt overall min guard assist for functional mobility and min-mod assist for ADL. Recommending SNF for follow up to maximize independence and safety with ADL and functional mobility prior to return home. Pt would benefit from continued skilled OT to address established goals.    Follow Up Recommendations  SNF;Supervision/Assistance - 24 hour    Equipment Recommendations  Other (comment)(TBD at next venue)    Recommendations for Other Services       Precautions / Restrictions Precautions Precautions: Fall Restrictions Weight Bearing Restrictions: No      Mobility Bed Mobility Overal bed mobility: Needs Assistance Bed Mobility: Supine to Sit     Supine to sit: Supervision     General bed mobility comments: Increased time and effort but no physical assist required  Transfers Overall transfer level: Needs assistance Equipment used: Rolling walker (2 wheeled) Transfers: Sit to/from Stand Sit to Stand: Min guard         General transfer comment: for safety, cues for hand placement    Balance Overall balance assessment: Mild deficits observed, not formally tested                                         ADL either performed or assessed with clinical judgement   ADL Overall ADL's : Needs assistance/impaired Eating/Feeding: Set up;Sitting   Grooming: Set up;Sitting   Upper Body Bathing: Minimal assistance;Sitting   Lower Body Bathing: Moderate assistance;Sit to/from stand   Upper Body Dressing : Minimal assistance;Sitting   Lower Body Dressing: Moderate assistance;Sit to/from  stand   Toilet Transfer: Min guard;Ambulation;Regular Toilet;RW   Toileting- Clothing Manipulation and Hygiene: Total assistance;Sit to/from stand Toileting - Clothing Manipulation Details (indicate cue type and reason): for peri care following incontinent BM     Functional mobility during ADLs: Min guard;Rolling walker       Vision         Perception     Praxis      Pertinent Vitals/Pain Pain Assessment: No/denies pain     Hand Dominance     Extremity/Trunk Assessment Upper Extremity Assessment Upper Extremity Assessment: Overall WFL for tasks assessed   Lower Extremity Assessment Lower Extremity Assessment: Defer to PT evaluation       Communication Communication Communication: No difficulties   Cognition Arousal/Alertness: Awake/alert Behavior During Therapy: WFL for tasks assessed/performed Overall Cognitive Status: No family/caregiver present to determine baseline cognitive functioning                                     General Comments       Exercises     Shoulder Instructions      Home Living Family/patient expects to be discharged to:: Private residence Living Arrangements: Spouse/significant other Available Help at Discharge: Available 24 hours/day;Family Type of Home: House Home Access: Level entry     Home Layout: One level     Bathroom Shower/Tub: Teacher, early years/pre: Handicapped height     Home  Equipment: Gilford Rile - 2 wheels;Cane - single point;Shower seat          Prior Functioning/Environment Level of Independence: Independent with assistive device(s)        Comments: Pt reports he uses RW vs cane for mobility; independent with ADL        OT Problem List: Decreased activity tolerance;Impaired balance (sitting and/or standing);Decreased cognition;Decreased safety awareness;Decreased knowledge of use of DME or AE      OT Treatment/Interventions: Self-care/ADL training;Therapeutic  exercise;Energy conservation;DME and/or AE instruction;Therapeutic activities;Patient/family education;Balance training    OT Goals(Current goals can be found in the care plan section) Acute Rehab OT Goals Patient Stated Goal: To go home OT Goal Formulation: With patient Time For Goal Achievement: 06/13/18 Potential to Achieve Goals: Good ADL Goals Pt Will Perform Grooming: with supervision;standing Pt Will Perform Upper Body Bathing: with set-up;sitting Pt Will Perform Lower Body Bathing: with supervision;sit to/from stand Pt Will Transfer to Toilet: with supervision;ambulating;bedside commode Pt Will Perform Toileting - Clothing Manipulation and hygiene: with supervision;sit to/from stand  OT Frequency: Min 2X/week   Barriers to D/C:            Co-evaluation              AM-PAC PT "6 Clicks" Daily Activity     Outcome Measure Help from another person eating meals?: None Help from another person taking care of personal grooming?: None Help from another person toileting, which includes using toliet, bedpan, or urinal?: A Lot Help from another person bathing (including washing, rinsing, drying)?: A Lot Help from another person to put on and taking off regular upper body clothing?: A Little Help from another person to put on and taking off regular lower body clothing?: A Lot 6 Click Score: 17   End of Session Equipment Utilized During Treatment: Rolling walker Nurse Communication: Mobility status(pt had BM during session)  Activity Tolerance: Patient tolerated treatment well Patient left: in chair;with call bell/phone within reach;with chair alarm set  OT Visit Diagnosis: Unsteadiness on feet (R26.81)                Time: 8280-0349 OT Time Calculation (min): 21 min Charges:  OT General Charges $OT Visit: 1 Visit OT Evaluation $OT Eval Moderate Complexity: 1 Mod G-Codes:     Laurelai Lepp A. Ulice Brilliant, M.S., OTR/L Acute Rehab Department: [phone]  Binnie Kand 05/30/2018, 4:17 PM

## 2018-05-30 NOTE — Progress Notes (Signed)
  Echocardiogram 2D Echocardiogram has been performed.  Julian Ross 05/30/2018, 5:45 PM

## 2018-05-30 NOTE — Clinical Social Work Placement (Signed)
   CLINICAL SOCIAL WORK PLACEMENT  NOTE  Date:  05/30/2018  Patient Details  Name: Julian Ross MRN: [id number] Date of Birth: 11-Jul-1946  Clinical Social Work is seeking post-discharge placement for this patient at the [place] level of care (*CSW will initial, date and re-position this form in  chart as items are completed):  Yes   Patient/family provided with [facility] Work Department's list of facilities offering this level of care within the geographic area requested by the patient (or if unable, by the patient's family).  Yes   Patient/family informed of their freedom to choose among providers that offer the needed level of care, that participate in Medicare, Medicaid or managed care program needed by the patient, have an available bed and are willing to accept the patient.  Yes   Patient/family informed of [facility]'s ownership interest in [facility] and [facility], as well as of the fact that they are under no obligation to receive care at these facilities.  PASRR submitted to EDS on 05/30/18     PASRR number received on       Existing PASRR number confirmed on 05/30/18     FL2 transmitted to all facilities in geographic area requested by pt/family on 05/30/18     FL2 transmitted to all facilities within larger geographic area on       Patient informed that his/her managed care company has contracts with or will negotiate with certain facilities, including the following:            Patient/family informed of bed offers received.  Patient chooses bed at       Physician recommends and patient chooses bed at      Patient to be transferred to   on  .  Patient to be transferred to facility by       Patient family notified on   of transfer.  Name of family member notified:        PHYSICIAN Please sign FL2     Additional Comment:    _______________________________________________ Candie Chroman, LCSW 05/30/2018, 3:04  PM

## 2018-05-30 NOTE — NC FL2 (Signed)
[place] MEDICAID FL2 LEVEL OF CARE SCREENING TOOL     IDENTIFICATION  Patient Name: Julian Ross Birthdate: 1946-09-16 Sex: male Admission Date (Current Location): 05/28/2018  [facility] and [place] Number:  Herbalist and Address:  [facility]. [place], [place] [address], [place], [place] [address]      Provider Number: [id number]  Attending Physician Name and Address:  Allie Bossier, MD  Relative Name and Phone Number:       Current Level of Care: Hospital Recommended Level of Care: Skilled Nursing Facility(with palliative) Prior Approval Number:    Date Approved/Denied:   PASRR Number: [id number]  Discharge Plan: SNF(with palliative)    Current Diagnoses: Patient Active Problem List   Diagnosis Date Noted  . Atrial fibrillation with RVR ([place]) 05/28/2018  . DKA (diabetic ketoacidoses) ([place]) 05/28/2018  . Demand ischemia ([place]) 05/28/2018  . Abscess 11/28/2017  . Anemia 11/28/2017  . Anxiety 11/28/2017  . Ischemic cardiomyopathy 11/28/2017  . Chronic back pain 11/28/2017  . Depression 11/28/2017  . DOE (dyspnea on exertion) 11/28/2017  . Leg edema 11/28/2017  . Essential tremor 11/28/2017  . GERD (gastroesophageal reflux disease) 11/28/2017  . Hepatitis A 11/28/2017  . HOH (hard of hearing) 11/28/2017  . Insomnia 11/28/2017  . Lower urinary tract symptoms (LUTS) 11/28/2017  . Mononucleosis 11/28/2017  . Neuromuscular disorder ([place]) 11/28/2017  . Neuropathy 11/28/2017  . Post-nasal drip 11/28/2017  . Sinusitis 11/28/2017  . Spinal stenosis 11/28/2017  . TB (tuberculosis) 11/28/2017  . Transfusion history 11/28/2017  . Vitamin D insufficiency 11/28/2017  . ARDS (adult respiratory distress syndrome) ([place]) 11/28/2017  . MI (myocardial infarction) ([place]) 11/28/2017  . Congestive cardiomyopathy ([place]) 11/28/2017  . Coronary artery disease 11/28/2017  . ASHD (arteriosclerotic heart disease) 11/28/2017  . DM (diabetes mellitus) ([place])  11/28/2017  . Arthritis 11/28/2017  . RA (rheumatoid arthritis) ([place]) 11/28/2017  . Hypokalemia 11/14/2017  . Chest pain 11/13/2017  . Pure hypercholesterolemia 04/23/2017  . Chronic anticoagulation 03/21/2017  . HCAP (healthcare-associated pneumonia) 10/30/2016  . Hyponatremia 10/30/2016  . OSA on CPAP 10/30/2016  . Sepsis due to pneumonia ([place]) 10/30/2016  . Diabetes mellitus type 2, insulin dependent ([place]) 10/30/2016  . Acute respiratory failure with hypoxia ([place])   . Angina pectoris, crescendo ([place]) 10/03/2016  . Knee effusion   . Syncope 07/20/2016  . Chronic combined systolic and diastolic CHF (congestive heart failure) ([place]) 07/19/2016  . CKD (chronic kidney disease) stage 3, GFR 30-59 ml/min (HCC) 07/19/2016  . Leukocytosis 07/19/2016  . Anemia of chronic kidney failure 07/19/2016  . Uncontrolled diabetes mellitus with diabetic nephropathy, with long-term current use of insulin ([place]) 07/19/2016  . Benign essential HTN 07/19/2016  . Dyslipidemia associated with type 2 diabetes mellitus ([place]) 07/19/2016  . Paroxysmal atrial fibrillation (HCC)   . CAD S/P percutaneous coronary angioplasty 04/16/2016  . Rheumatoid arthritis of multiple sites with negative rheumatoid factor ([place])   . S/P lumbar fusion 04/16/2014  . Radicular pain 04/16/2014  . Lumbar stenosis 04/09/2014  . Aftercare following surgery of the musculoskeletal system, NEC 03/10/2014  . History of lumbar laminectomy for spinal cord decompression 12/29/2013  . Renal insufficiency 12/03/2013  . Herniated lumbar intervertebral disc 11/17/2013  . Spinal stenosis of lumbar region with neurogenic claudication 11/11/2013  . Pre-syncope 01/12/2013  . AKI (acute kidney injury) ([place]) 01/12/2013    Orientation RESPIRATION BLADDER Height & Weight     Self, Time, Situation, Place  Normal, Other (Comment)(Cpap at night) Incontinent Weight: 202  lb (91.6 kg) Height:  5\' 11"  (180.3 cm)  BEHAVIORAL SYMPTOMS/MOOD NEUROLOGICAL  BOWEL NUTRITION STATUS  (None) (None) Continent Diet(Carb modified.)  AMBULATORY STATUS COMMUNICATION OF NEEDS Skin   Limited Assist Verbally Normal                       Personal Care Assistance Level of Assistance  Bathing, Feeding, Dressing Bathing Assistance: Limited assistance Feeding assistance: Independent Dressing Assistance: Limited assistance     Functional Limitations Info  Sight, Speech, Hearing Sight Info: Adequate Hearing Info: Adequate Speech Info: Adequate    SPECIAL CARE FACTORS FREQUENCY  PT (By licensed PT), OT (By licensed OT)     PT Frequency: 5 x week OT Frequency: 5 x week            Contractures Contractures Info: Not present    Additional Factors Info  Code Status, Allergies, Isolation Precautions Code Status Info: DNR Allergies Info: Statins, Ace Inhibitors, Contrast Media (Iodinated Diagnostic Agents), Penicillins.     Isolation Precautions Info: Contact: MRSA     Current Medications (05/30/2018):  This is the current hospital active medication list Current Facility-Administered Medications  Medication Dose Route Frequency Provider Last Rate Last Dose  . 0.9 %  sodium chloride infusion   Intravenous Continuous Samuella Cota, MD 125 mL/hr at 05/29/18 2151    . acetaminophen (TYLENOL) tablet 650 mg  650 mg Oral Q6H PRN Samuella Cota, MD       Or  . acetaminophen (TYLENOL) suppository 650 mg  650 mg Rectal Q6H PRN Samuella Cota, MD      . ALPRAZolam Duanne Moron) tablet 0.5 mg  0.5 mg Oral QHS PRN Samuella Cota, MD      . apixaban Arne Cleveland) tablet 5 mg  5 mg Oral BID Allie Bossier, MD   5 mg at 05/30/18 1219  . Chlorhexidine Gluconate Cloth 2 % PADS 6 each  6 each Topical Q0600 Allie Bossier, MD   6 each at 05/30/18 0602  . clopidogrel (PLAVIX) tablet 75 mg  75 mg Oral Daily Samuella Cota, MD   75 mg at 05/30/18 2353  . dextrose 5 %-0.45 % sodium chloride infusion   Intravenous Continuous Samuella Cota, MD    Stopped at 05/29/18 [phone]  . insulin aspart (novoLOG) injection 0-15 Units  0-15 Units Subcutaneous Q4H Allie Bossier, MD   5 Units at 05/30/18 1219  . insulin glargine (LANTUS) injection 18 Units  18 Units Subcutaneous Daily Allie Bossier, MD   18 Units at 05/30/18 0902  . isosorbide mononitrate (IMDUR) 24 hr tablet 60 mg  60 mg Oral Daily Samuella Cota, MD   60 mg at 05/30/18 3154  . latanoprost (XALATAN) 0.005 % ophthalmic solution 1 drop  1 drop Both Eyes QHS Samuella Cota, MD   1 drop at 05/29/18 2139  . MEDLINE mouth rinse  15 mL Mouth Rinse BID Samuella Cota, MD   15 mL at 05/30/18 0086  . mupirocin ointment (BACTROBAN) 2 % 1 application  1 application Nasal BID Allie Bossier, MD   1 application at 76/19/50 [phone]  . nitroGLYCERIN (NITROSTAT) SL tablet 0.4 mg  0.4 mg Sublingual Q5 min PRN Samuella Cota, MD      . pantoprazole (PROTONIX) EC tablet 40 mg  40 mg Oral Daily Samuella Cota, MD   40 mg at 05/30/18 7124  . predniSONE (DELTASONE) tablet 5 mg  5 mg  Oral Q breakfast Samuella Cota, MD   5 mg at 05/30/18 0901  . ranolazine (RANEXA) 12 hr tablet 1,000 mg  1,000 mg Oral BID Allie Bossier, MD   1,000 mg at 05/30/18 1331  . sodium chloride flush (NS) 0.9 % injection 3 mL  3 mL Intravenous Q12H Samuella Cota, MD   3 mL at 05/30/18 0904  . tamsulosin (FLOMAX) capsule 0.4 mg  0.4 mg Oral Daily Samuella Cota, MD   0.4 mg at 05/30/18 2081     Discharge Medications: Please see discharge summary for a list of discharge medications.  Relevant Imaging Results:  Relevant Lab Results:   Additional Information SS#: [id number]. Wife also request SNF discuss discharge insulin options related to cost before discharge  Candie Chroman, LCSW

## 2018-05-30 NOTE — Clinical Social Work Note (Signed)
Clinical Social Work Assessment  Patient Details  Name: Julian Ross MRN: [id number] Date of Birth: 04-Dec-1946  Date of referral:  05/30/18               Reason for consult:  Facility Placement, Discharge Planning                Permission sought to share information with:  Chartered certified accountant granted to share information::  Yes, Verbal Permission Granted  Name::        Agency::  SNF's  Relationship::     Contact Information:     Housing/Transportation Living arrangements for the past 2 months:  Apartment Source of Information:  Patient, Medical Team Patient Interpreter Needed:  None Criminal Activity/Legal Involvement Pertinent to Current Situation/Hospitalization:  No - Comment as needed Significant Relationships:  Adult Children, Spouse Lives with:  Spouse Do you feel safe going back to the place where you live?  Yes Need for family participation in patient care:  Yes (Comment)  Care giving concerns:  PT recommending SNF once medically stable.   Social Worker assessment / plan:  CSW met with patient. No supports at bedside. CSW introduced role and explained that PT recommendations would be discussed. Patient prefers to return home at discharge but is agreeable to SNF placement. No preference of facility. CSW provided SNF list for patient and his wife to review. No further concerns. CSW encouraged patient to contact CSW as needed. CSW will continue to follow patient for support and facilitate discharge to SNF once medically stable.  Employment status:  Retired Forensic scientist:  Medicare PT Recommendations:  [place] / Referral to community resources:  [place]  Patient/Family's Response to care:  Patient agreeable to SNF placement. Patient's wife and son supportive and involved in patient's care. Patient appreciated social work intervention.  Patient/Family's Understanding of and Emotional Response to  Diagnosis, Current Treatment, and Prognosis:  Patient has a good understanding of the reason for admission and his need for rehab prior to returning home. Patient appears happy with hospital care.  Emotional Assessment Appearance:  Appears stated age Attitude/Demeanor/Rapport:  Engaged, Gracious Affect (typically observed):  Accepting, Appropriate, Calm, Pleasant Orientation:  Oriented to Self, Oriented to Place, Oriented to  Time, Oriented to Situation Alcohol / Substance use:  Never Used Psych involvement (Current and /or in the community):  No (Comment)  Discharge Needs  Concerns to be addressed:  Care Coordination Readmission within the last 30 days:  No Current discharge risk:  Dependent with Mobility Barriers to Discharge:  Continued Medical Work up, [employer] inpatient day 2/3.)   Candie Chroman, LCSW 05/30/2018, 3:01 PM

## 2018-05-30 NOTE — Progress Notes (Signed)
[place] for heparin Indication: atrial fibrillation  Allergies  Allergen Reactions  . Statins Other (See Comments)    Causes stiffness in joints   . Ace Inhibitors Cough  . Contrast Media [Iodinated Diagnostic Agents] Rash    Has to take benadryl prior to use  . Penicillins Hives and Rash    Has patient had a PCN reaction causing immediate rash, facial/tongue/throat swelling, SOB or lightheadedness with hypotension: Yes Has patient had a PCN reaction causing severe rash involving mucus membranes or skin necrosis: No Has patient had a PCN reaction that required hospitalization pt was in the hospital at time of last reaction - heart attack Has patient had a PCN reaction occurring within the last 10 years: No If all of the above answers are "NO", then may proceed with Cephalosporin use.    Patient Measurements: Height: 5\' 11"  (180.3 cm) Weight: 202 lb (91.6 kg) IBW/kg (Calculated) : 75.3 Heparin Dosing Weight: 92kg  Vital Signs: Temp: 97.7 F (36.5 C) (05/30 2359) Temp Source: Oral (05/30 2359) BP: 120/68 (05/30 2359) Pulse Rate: 67 (05/30 2359)  Labs: Recent Labs    05/28/18 1402 05/28/18 1440 05/28/18 1755  05/28/18 1918 05/28/18 2356 05/29/18 0227 05/29/18 1004 05/29/18 1711 05/29/18 2157  HGB 15.7 16.3  --   --   --   --   --   --   --   --   HCT 46.1 48.0  --   --   --   --   --   --   --   --   PLT 586*  --   --   --   --   --   --   --   --   --   APTT  --   --   --   --   --   --   --   --   --  139*  HEPARINUNFRC  --   --   --   --   --   --   --   --   --  1.62*  CREATININE 4.55* 4.20*  --   --  3.93* 3.41* 3.13*  --   --   --   CKTOTAL  --   --  784*  --   --   --   --   --   --   --   TROPONINI  --   --   --    < > 0.17*  --   --  0.12* 0.11* 0.07*   < > = values in this interval not displayed.    Estimated Creatinine Clearance: 25 mL/min (A) (by C-G formula based on SCr of 3.13 mg/dL (H)).   Medical  History: Past Medical History:  Diagnosis Date  . Anemia   . Anxiety state   . CAD (coronary artery disease)    16 prior stents (at [facility])  . Cardiomyopathy, ischemic   . Chronic lower back pain   . Chronic systolic CHF (congestive heart failure) ([place])   . CKD (chronic kidney disease), stage III ([place])   . Depression   . Diabetes mellitus type 2 in obese ([place])   . Fibromyalgia   . Hepatitis A    at age 70  . High cholesterol   . History of blood transfusion    "related to OR"  . Hypertension   . Hypokalemia   . MI (myocardial infarction) ([place]) 1990; 1995;  1997  . OSA on CPAP    setting = 13, full face mask  . Osteoarthritis   . Persistent atrial fibrillation ([place]) 02/2016   on Eliquis  . Rheumatoid arthritis of multiple sites with negative rheumatoid factor ([place])   . Septic shock ([place]) 02/2016   in setting of severe pneumonia   Assessment: 72 year old male with history of afib on apixaban prior to admit, this was held due to acute renal failure. Last dose was 5/27. Sq heparin was given yesterday, orders to start full dose IV heparin this afternoon.   Due to apixaban possibly still in system, will check aptt in addition to heparin levels for monitoring.   Intial heparin level 1.62, aPTT 139 sec.  Goal of Therapy:  Heparin level 0.3-0.7 units/ml Monitor platelets by anticoagulation protocol: Yes   Plan:  Decrease heparin to 900 units/hr Check heparin level and aPTT 8 hours after rate change  Excell Seltzer, PharmD Clinical Pharmacist 05/30/2018 12:01 AM

## 2018-05-30 NOTE — Progress Notes (Addendum)
Julian Ross for heparin Indication: atrial fibrillation  Allergies  Allergen Reactions  . Statins Other (See Comments)    Causes stiffness in joints   . Ace Inhibitors Cough  . Contrast Media [Iodinated Diagnostic Agents] Rash    Has to take benadryl prior to use  . Penicillins Hives and Rash    Has patient had a PCN reaction causing immediate rash, facial/tongue/throat swelling, SOB or lightheadedness with hypotension: Yes Has patient had a PCN reaction causing severe rash involving mucus membranes or skin necrosis: No Has patient had a PCN reaction that required hospitalization pt was in the hospital at time of last reaction - heart attack Has patient had a PCN reaction occurring within the last 10 years: No If all of the above answers are "NO", then may proceed with Cephalosporin use.    Patient Measurements: Height: 5\' 11"  (180.3 cm) Weight: 202 lb (91.6 kg) IBW/kg (Calculated) : 75.3 Heparin Dosing Weight: 92kg  Vital Signs: Temp: 97.5 F (36.4 C) (05/31 0700) Temp Source: Oral (05/31 0700) BP: 141/76 (05/31 0700) Pulse Rate: 107 (05/31 0755)  Labs: Recent Labs    05/28/18 1402 05/28/18 1440 05/28/18 1755  05/28/18 2356 05/29/18 0227 05/29/18 1004 05/29/18 1711 05/29/18 2157 05/30/18 0251 05/30/18 0931  HGB 15.7 16.3  --   --   --   --   --   --   --  11.2*  --   HCT 46.1 48.0  --   --   --   --   --   --   --  34.3*  --   PLT 586*  --   --   --   --   --   --   --   --  349  --   APTT  --   --   --   --   --   --   --   --  139*  --  85*  HEPARINUNFRC  --   --   --   --   --   --   --   --  1.62*  --  1.44*  CREATININE 4.55* 4.20*  --    < > 3.41* 3.13*  --   --   --  1.86*  --   CKTOTAL  --   --  784*  --   --   --   --   --   --   --   --   TROPONINI  --   --   --    < >  --   --  0.12* 0.11* 0.07*  --   --    < > = values in this interval not displayed.    Estimated Creatinine Clearance: 42.1 mL/min (A) (by C-G  formula based on SCr of 1.86 mg/dL (H)).   Medical History: Past Medical History:  Diagnosis Date  . Anemia   . Anxiety state   . CAD (coronary artery disease)    16 prior stents (at [facility])  . Cardiomyopathy, ischemic   . Chronic lower back pain   . Chronic systolic CHF (congestive heart failure) ([place])   . CKD (chronic kidney disease), stage III ([place])   . Depression   . Diabetes mellitus type 2 in obese ([place])   . Fibromyalgia   . Hepatitis A    at age 41  . High cholesterol   . History of blood transfusion    "  related to OR"  . Hypertension   . Hypokalemia   . MI (myocardial infarction) ([place]) 1990; 1995; 1997  . OSA on CPAP    setting = 13, full face mask  . Osteoarthritis   . Persistent atrial fibrillation ([place]) 02/2016   on Eliquis  . Rheumatoid arthritis of multiple sites with negative rheumatoid factor ([place])   . Septic shock ([place]) 02/2016   in setting of severe pneumonia   Assessment: 72 year old male with history of afib on apixaban prior to admit, on hold due to acute renal failure (Last dose was 5/27). Pharmacy dosing heparin.  -heparin level= 1.44 (due to recent apixaban) -aPTT= 85 and at goal  -Hg= 11.2 (down from 15.7 at admit; prior values to be ~ 12 in 2018). Also note + 2.7L since 5/30   Goal of Therapy:  Heparin level 0.3-0.7 units/ml Monitor platelets by anticoagulation protocol: Yes   Plan:  -No heparin changes needed -Daily heparin level, aPTT and CBC  Hildred Laser, PharmD Clinical Pharmacist Clinical phone from 8:30-4:00 is [phone] After 4pm, please call Main Rx [phone]) for assistance. 05/30/2018 10:27 AM   e

## 2018-05-30 NOTE — Progress Notes (Signed)
PROGRESS NOTE    Julian Ross  RCV:[id number] DOB: [birth year] DOA: 05/28/2018 PCP: Reynold Bowen, MD   Brief Narrative:  72 year old man WM PMHx Dementia, Anxiety, Depression, DM Type 2 uncontrolled with complication, CAD, Chronic Systolic CHF, ischemic Cardiomyopathy, HTN, Persistent Atrial Fibrillation on anticoagulation, OSA on CPAP, CKD stage III, RA with negative RF  Presented to the emergency department with increasing confusion, falls.  Admitted for acute kidney injury, atrial fibrillation with rapid ventricular response, DKA, acute encephalopathy.   Patient has long-standing dementia with acute worsening of encephalopathy over the last several days.  He is unable to provide any history.  All history obtained from wife and son at bedside.  Patient has long-standing dementia, however at baseline he is able to ambulate without assistance, eat without assistance.  He recognizes family members but short-term memory is poor.  In the last month his sleep schedule has changed, he previously would sleep 18 hours a day, but now has been staying up most the night.  Over the last few days, his oral intake is dropped to minimal, he has become more confused, unable to carry on a conversation, now with muscle jerks and has fallen out of bed several times.  He typically administers his own medications but has not done so for the last couple of days.  Typically on insulin and has suffered from hypoglycemic episodes but also hyperglycemic episodes.  Review of systems is severely limited but there is been no fever or systemic symptoms per wife.  Remainder as below.   ED Course: Tachycardic, hypertensive.  Treated with lorazepam, diltiazem infusion, normal saline.     Subjective: 5/31 A/O x3 (does not know why).  Sitting in chair comfortably.  Negative CP, negative abdominal pain, negative S OB.    Assessment & Plan:   Active Problems:   CKD (chronic kidney disease) stage 3, GFR 30-59 ml/min (HCC)  AKI (acute kidney injury) ([place])   Atrial fibrillation with RVR (HCC)   DKA (diabetic ketoacidoses) ([place])   Demand ischemia ([place])  DKA/Diabetes type 2 uncontrolled with complication -1/02 Hemoglobin A1c= 8.3 -Lipid panel within ADA guidelines - Diabetic coordinator has evaluated patient and recommendations.   - Diabetic nutritionist consulted.   - 5/31 increase Lantus 18 units daily .  Given that patient uncontrolled on previous home regimen and admitted for DKA would consider sending home on Lantus and NovoLog. -Moderate SSI   Altered mental status -CT head: Negative for acute infarcts CT results below -Resolved  Acute on CKD stage III(baseline Cr-2.26) - Multifactorial to include poor oral intake, uncontrolled diabetes. Recent Labs  Lab 05/28/18 1402 05/28/18 1440 05/28/18 1918 05/28/18 2356 05/29/18 0227 05/30/18 0251  CREATININE 4.55* 4.20* 3.93* 3.41* 3.13* 1.86*  - Below baseline    A. fib with RVR - Most likely secondary to DKA - Currently NSR -DC diltiazem drip - DC heparin, restart Apixaban 5 mg BID (home dose) -Restart Ranexa 1000 mg BID, now off diltiazem    Demand ischemia vs NSTEMI? - EKG changes in leads I, aVL and II, III, aVF -Trend troponin - Echocardiogram pending: Cardiac catheterization 11/15/2017 showed severe three-vessel disease  Elevated lactic acid -Most likely secondary to DKA/dehydration. - Discontinue antibiotic.  If patient spikes fever or continue elevated leukocytosis panculture and then restart antibiotic.  Hypokalemia - Potassium goal> 4 - Potassium IV 50 mEq -Recheck K/Mg @1600  .   Rheumatoid Arthritis - Prednisone 5 mg daily     DVT prophylaxis: Apixaban Code Status: DNR Family Communication: None Disposition  Plan: Discharge in 24-48 hrs.   Consultants:  None   Procedures/Significant Events:  Echocardiogram pending 5/29 CT head WO contrast:- Moderate to severe parenchymal brain volume loss.  - Patchy  supratentorial white matter hypodensities less than expected for patient's age, though non-specific are most compatible with chronic small vessel ischemic disease.  -Small RIGHT frontal and RIGHT parietal encephalomalacia.  -No acute large vascular territory infarcts.        I have personally reviewed and interpreted all radiology studies and my findings are as above.  VENTILATOR SETTINGS:    Cultures 5/29 MRSA by PCR positive    Antimicrobials: Anti-infectives (From admission, onward)   Start     Stop   05/29/18 1800  Levofloxacin (LEVAQUIN) IVPB 250 mg  Status:  Discontinued     05/29/18 1021   05/28/18 1700  ceFEPIme (MAXIPIME) 1 g in sodium chloride 0.9 % 100 mL IVPB     05/28/18 1828   05/28/18 1630  vancomycin (VANCOCIN) IVPB 1000 mg/200 mL premix  Status:  Discontinued     05/28/18 1850       Devices   LINES / TUBES:      Continuous Infusions: . sodium chloride 125 mL/hr at 05/29/18 2151  . dextrose 5 % and 0.45% NaCl Stopped (05/29/18 0948)  . diltiazem (CARDIZEM) infusion Stopped (05/29/18 1711)  . heparin 900 Units/hr (05/30/18 0015)     Objective: Vitals:   05/29/18 2359 05/30/18 0409 05/30/18 0700 05/30/18 0755  BP: 120/68 126/61 (!) 141/76   Pulse: 67 79 81 (!) 107  Resp: 19 19 14    Temp: 97.7 F (36.5 C) 98.6 F (37 C) (!) 97.5 F (36.4 C)   TempSrc: Oral Oral Oral   SpO2: 100% 98% 99% 93%  Weight:      Height:        Intake/Output Summary (Last 24 hours) at 05/30/2018 0825 Last data filed at 05/30/2018 0800 Gross per 24 hour  Intake 3435.65 ml  Output 400 ml  Net 3035.65 ml   Filed Weights   05/28/18 1359  Weight: 202 lb (91.6 kg)    Physical Exam:  General: Somnolent, arousable, A/O x1 (does not know where, when, why).  Answers all questions I am at grandma's No acute respiratory distress Neck:  Negative scars, masses, torticollis, lymphadenopathy, JVD Lungs: Clear to auscultation bilaterally without wheezes or  crackles Cardiovascular: Regular rate and rhythm without murmur gallop or rub normal S1 and S2 Abdomen: negative abdominal pain, nondistended, positive soft, bowel sounds, no rebound, no ascites, no appreciable mass Extremities: No significant cyanosis, clubbing, or edema bilateral lower extremities Skin: Negative rashes, lesions, ulcers Psychiatric:  Negative depression, negative anxiety, negative fatigue, negative mania  Central nervous system:  Cranial nerves II through XII intact, tongue/uvula midline, all extremities muscle strength 5/5, sensation intact throughout,  negative dysarthria, negative expressive aphasia, negative receptive aphasia.   Data Reviewed: Care during the described time interval was provided by me .  I have reviewed this patient's available data, including medical history, events of note, physical examination, and all test results as part of my evaluation.   CBC: Recent Labs  Lab 05/28/18 1402 05/28/18 1440 05/30/18 0251  WBC 17.3*  --  12.4*  NEUTROABS 13.6*  --   --   HGB 15.7 16.3 11.2*  HCT 46.1 48.0 34.3*  MCV 87.8  --  91.0  PLT 586*  --  789   Basic Metabolic Panel: Recent Labs  Lab 05/28/18 1402 05/28/18 1440 05/28/18 1918  05/28/18 2356 05/29/18 0227 05/29/18 1711 05/30/18 0251  NA 140 139 139 145 147*  --  141  K 2.9* 2.7* 2.6* 3.8 2.5*  --  2.8*  CL 91* 95* 93* 102 100*  --  103  CO2 22  --  25 28 30   --  29  GLUCOSE 385* 408* 389* 186* 159*  --  141*  BUN 97* 73* 92* 87* 79*  --  48*  CREATININE 4.55* 4.20* 3.93* 3.41* 3.13*  --  1.86*  CALCIUM 10.3  --  9.5 9.5 10.0  --  8.5*  MG  --   --   --   --   --  2.4 2.2   GFR: Estimated Creatinine Clearance: 42.1 mL/min (A) (by C-G formula based on SCr of 1.86 mg/dL (H)). Liver Function Tests: Recent Labs  Lab 05/28/18 1402  AST 46*  ALT 18  ALKPHOS 47  BILITOT 1.8*  PROT 9.2*  ALBUMIN 3.5   No results for input(s): LIPASE, AMYLASE in the last 168 hours. No results for input(s):  AMMONIA in the last 168 hours. Coagulation Profile: No results for input(s): INR, PROTIME in the last 168 hours. Cardiac Enzymes: Recent Labs  Lab 05/28/18 1755 05/28/18 1918 05/29/18 1004 05/29/18 1711 05/29/18 2157  CKTOTAL 784*  --   --   --   --   TROPONINI  --  0.17* 0.12* 0.11* 0.07*   BNP (last 3 results) Recent Labs    05/05/18 0939  PROBNP 622*   HbA1C: Recent Labs    05/29/18 1004  HGBA1C 8.3*   CBG: Recent Labs  Lab 05/29/18 1637 05/29/18 2059 05/29/18 2358 05/30/18 0414 05/30/18 0742  GLUCAP 219* 226* 182* 141* 141*   Lipid Profile: Recent Labs    05/29/18 1004  CHOL 98  HDL 17*  LDLCALC 64  TRIG 87  CHOLHDL 5.8   Thyroid Function Tests: No results for input(s): TSH, T4TOTAL, FREET4, T3FREE, THYROIDAB in the last 72 hours. Anemia Panel: No results for input(s): VITAMINB12, FOLATE, FERRITIN, TIBC, IRON, RETICCTPCT in the last 72 hours. Urine analysis:    Component Value Date/Time   COLORURINE YELLOW 05/28/2018 1402   APPEARANCEUR CLEAR 05/28/2018 1402   LABSPEC 1.015 05/28/2018 1402   PHURINE 6.0 05/28/2018 1402   GLUCOSEU 150 (A) 05/28/2018 1402   HGBUR SMALL (A) 05/28/2018 1402   BILIRUBINUR NEGATIVE 05/28/2018 1402   KETONESUR 5 (A) 05/28/2018 1402   PROTEINUR 100 (A) 05/28/2018 1402   NITRITE NEGATIVE 05/28/2018 1402   LEUKOCYTESUR NEGATIVE 05/28/2018 1402   Sepsis Labs: @LABRCNTIP (procalcitonin:4,lacticidven:4)  ) Recent Results (from the past 240 hour(s))  MRSA PCR Screening     Status: Abnormal   Collection Time: 05/28/18  9:52 PM  Result Value Ref Range Status   MRSA by PCR POSITIVE (A) NEGATIVE Final    Comment:        The GeneXpert MRSA Assay (FDA approved for NASAL specimens only), is one component of a comprehensive MRSA colonization surveillance program. It is not intended to diagnose MRSA infection nor to guide or monitor treatment for MRSA infections. RESULT CALLED TO, READ BACK BY AND VERIFIED WITH: Royanne Foots RN 05/29/18 0425 JDW Performed at [facility], [address] [place], [place] [address]          Radiology Studies: Ct Head Wo Contrast  Result Date: 05/28/2018 CLINICAL DATA:  Altered mental status for 3 days. History of stroke, hypertension, diabetes, hypercholesterolemia, rheumatoid arthritis. EXAM: CT HEAD WITHOUT CONTRAST  TECHNIQUE: Contiguous axial images were obtained from the base of the skull through the vertex without intravenous contrast. COMPARISON:  CT HEAD July 27, 2016 FINDINGS: BRAIN: No intraparenchymal hemorrhage, mass effect nor midline shift. Moderate to severe parenchymal brain volume loss. No hydrocephalus. Patchy supratentorial white matter hypodensities less than expected for patient's age, though non-specific are most compatible with chronic small vessel ischemic disease. Small RIGHT frontal and RIGHT parietal encephalomalacia. No acute large vascular territory infarcts. No abnormal extra-axial fluid collections. Basal cisterns are patent. VASCULAR: Severe calcific atherosclerosis of the carotid siphons. SKULL: No skull fracture. No significant scalp soft tissue swelling. SINUSES/ORBITS: Severe acute on chronic paranasal sinusitis, perforated nasal septum. Status post LEFT FESS. Trace RIGHT mastoid effusion. Status post bilateral ocular lens implants. OTHER: None. IMPRESSION: 1. No acute intracranial process. 2. Moderate to severe parenchymal brain volume loss. 3. 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 4. Acute on chronic severe paranasal sinusitis. Electronically Signed   By: Elon Alas M.D.   On: 05/28/2018 16:12   Dg Chest Port 1 View  Result Date: 05/28/2018 CLINICAL DATA:  Altered mental status EXAM: PORTABLE CHEST 1 VIEW COMPARISON:  PA and lateral chest x-ray of November 13, 2017 FINDINGS: The lungs are mildly hypoinflated. There is no focal infiltrate. The heart is top-normal in size. The pulmonary vascularity is normal. There is  calcification in the wall of the aortic arch. There is no pleural effusion. IMPRESSION: There is no acute cardiopulmonary abnormality. Chronic borderline cardiomegaly. Thoracic aortic atherosclerosis. Electronically Signed   By: David  Martinique M.D.   On: 05/28/2018 15:11        Scheduled Meds: . aspirin EC  81 mg Oral Daily  . Chlorhexidine Gluconate Cloth  6 each Topical Q0600  . clopidogrel  75 mg Oral Daily  . insulin aspart  0-15 Units Subcutaneous Q4H  . insulin glargine  10 Units Subcutaneous Daily  . isosorbide mononitrate  60 mg Oral Daily  . latanoprost  1 drop Both Eyes QHS  . mouth rinse  15 mL Mouth Rinse BID  . mupirocin ointment  1 application Nasal BID  . pantoprazole  40 mg Oral Daily  . potassium chloride  40 mEq Oral BID  . predniSONE  5 mg Oral Q breakfast  . sodium chloride flush  3 mL Intravenous Q12H  . tamsulosin  0.4 mg Oral Daily   Continuous Infusions: . sodium chloride 125 mL/hr at 05/29/18 2151  . dextrose 5 % and 0.45% NaCl Stopped (05/29/18 0948)  . diltiazem (CARDIZEM) infusion Stopped (05/29/18 1711)  . heparin 900 Units/hr (05/30/18 0015)     LOS: 2 days    Time spent: 40 minutes    Annis Lagoy, Geraldo Docker, MD [facility] Pager [phone]   If 7PM-7AM, please contact night-coverage [email or web address] Password [email or web address] 05/30/2018, 8:25 AM

## 2018-05-30 NOTE — Care Management Note (Addendum)
Case Management Note  Patient Details  Name: Julian Ross MRN: [id number] Date of Birth: January 07, 1946  Subjective/Objective:    Pt admitted with increasing confusion and falls                Action/Plan:  PTA from home with wife.  Pt /wife in agreement to SNF due to lack of supervision at home.  CM consulted CSW and informed that wife request Palliative follow at SNF - wife also request SNF discuss discharge insulin options related to cost before discharge   Expected Discharge Date:                  Expected Discharge Plan:  Skilled Nursing Facility  In-House Referral:  Clinical Social Work  Discharge planning Services  CM Consult  Post Acute Care Choice:    Choice offered to:     DME Arranged:    DME Agency:     HH Arranged:    [place] Agency:     Status of Service:     If discussed at [employer], dates discussed:    Additional Comments:  Maryclare Labrador, RN 05/30/2018, 11:56 AM

## 2018-05-30 NOTE — Evaluation (Signed)
Physical Therapy Evaluation Patient Details Name: Julian Ross MRN: [id number] DOB: 1946-03-13 Today's Date: 05/30/2018   History of Present Illness  72 yo admitted with falls and confusion. PMHx: anxiety, CAD, RA, chronic back pain, CHF, CKD, DM, HLD, HTN, atrial fibrillation, and dementia  Clinical Impression  Pt pleasant and willing to participate with therapy. Pt answers some questions appropriately with increased time. Pt demonstrates functional mobility deficits related to transfers and gait requiring min guard for safety. Pt ambulated 150 ft with RW without significant instability but at decreased speed compared to baseline according to pt. Pt would benefit from skilled PT to improve level of independence for D/C home vs SNF. SNF currently recommended due to lack of knowledge of home supervision currently. If supervision is adequate, D/C location may be changed from SNF to home with HHPT.    Follow Up Recommendations SNF;Supervision/Assistance - 24 hour    Equipment Recommendations  None recommended by PT    Recommendations for Other Services       Precautions / Restrictions Precautions Precautions: Fall Restrictions Weight Bearing Restrictions: No      Mobility  Bed Mobility Overal bed mobility: Needs Assistance Bed Mobility: Supine to Sit     Supine to sit: Supervision     General bed mobility comments: use of rail to move from supine to sit; Increased time required. Pt able to scoot forward to EOB  Transfers Overall transfer level: Needs assistance Equipment used: Rolling walker (2 wheeled) Transfers: Sit to/from Stand Sit to Stand: Min guard         General transfer comment: cues for hand placement; min guard for safety  Ambulation/Gait Ambulation/Gait assistance: Min guard Ambulation Distance (Feet): 150 Feet Assistive device: Rolling walker (2 wheeled) Gait Pattern/deviations: Step-through pattern;Decreased stride length;Trunk flexed Gait velocity:  Decreased Gait velocity interpretation: <1.8 ft/sec, indicate of risk for recurrent falls General Gait Details: cues for breathing technique; SpO2% recorded desaturation on RA during ambulation but waveform inconsistent and pt asymptomatic. Pt ambulates slowly but without LOB or significant instability with use of RW. Pt able to follow directional commands during ambulation  Stairs            Wheelchair Mobility    Modified Rankin (Stroke Patients Only)       Balance Overall balance assessment: Mild deficits observed, not formally tested                                           Pertinent Vitals/Pain Pain Assessment: No/denies pain    Home Living Family/patient expects to be discharged to:: Private residence Living Arrangements: Spouse/significant other Available Help at Discharge: Available 24 hours/day;Family Type of Home: House Home Access: Level entry     Home Layout: One level Home Equipment: Environmental consultant - 2 wheels;Cane - single point      Prior Function Level of Independence: Independent with assistive device(s)         Comments: SPC to ambulate until 2 weeks ago according to chart. Pt unable to provide and taken from prior history     Hand Dominance        Extremity/Trunk Assessment   Upper Extremity Assessment Upper Extremity Assessment: Overall WFL for tasks assessed    Lower Extremity Assessment Lower Extremity Assessment: Overall WFL for tasks assessed       Communication   Communication: No difficulties  Cognition Arousal/Alertness: Awake/alert   Overall  Cognitive Status: History of cognitive impairments - at baseline                                 General Comments: Pt oriented to person, place, and situation but disoriented to time. Pt requires extra time to consider questions and provide apporpriate answers; Dementia at baseline      General Comments      Exercises General Exercises - Lower  Extremity Long Arc Quad: AROM;10 reps;Seated;Both   Assessment/Plan    PT Assessment Patient needs continued PT services  PT Problem List Decreased mobility;Decreased strength;Decreased safety awareness;Decreased activity tolerance;Decreased balance;Decreased knowledge of use of DME;Decreased cognition       PT Treatment Interventions Gait training;Therapeutic activities;Neuromuscular re-education;Stair training;Therapeutic exercise;Cognitive remediation;DME instruction;Functional mobility training;Balance training;Patient/family education    PT Goals (Current goals can be found in the Care Plan section)  Acute Rehab PT Goals Patient Stated Goal: To go home PT Goal Formulation: With patient Time For Goal Achievement: 06/13/18 Potential to Achieve Goals: Fair    Frequency Min 3X/week   Barriers to discharge Decreased caregiver support unclear how much assist family can provide    Co-evaluation               AM-PAC PT "6 Clicks" Daily Activity  Outcome Measure Difficulty turning over in bed (including adjusting bedclothes, sheets and blankets)?: None Difficulty moving from lying on back to sitting on the side of the bed? : A Little Difficulty sitting down on and standing up from a chair with arms (e.g., wheelchair, bedside commode, etc,.)?: A Little Help needed moving to and from a bed to chair (including a wheelchair)?: A Little Help needed walking in hospital room?: A Little Help needed climbing 3-5 steps with a railing? : A Little 6 Click Score: 19    End of Session Equipment Utilized During Treatment: Gait belt Activity Tolerance: Patient tolerated treatment well Patient left: in chair;with call bell/phone within reach;with chair alarm set Nurse Communication: Mobility status PT Visit Diagnosis: Other abnormalities of gait and mobility (R26.89);Muscle weakness (generalized) (M62.81);History of falling (Z91.81)    Time: 6962-9528 PT Time Calculation (min) (ACUTE  ONLY): 28 min   Charges:   PT Evaluation $PT Eval Moderate Complexity: 1 Mod PT Treatments $Gait Training: 8-22 mins   PT G Codes:        Gabe Roddrick Sharron, SPT  [employer] 05/30/2018, 9:25 AM

## 2018-05-31 DIAGNOSIS — N183 Chronic kidney disease, stage 3 (moderate): Secondary | ICD-10-CM

## 2018-05-31 DIAGNOSIS — I248 Other forms of acute ischemic heart disease: Secondary | ICD-10-CM

## 2018-05-31 DIAGNOSIS — N179 Acute kidney failure, unspecified: Principal | ICD-10-CM

## 2018-05-31 DIAGNOSIS — E876 Hypokalemia: Secondary | ICD-10-CM

## 2018-05-31 LAB — GLUCOSE, CAPILLARY
GLUCOSE-CAPILLARY: 135 mg/dL — AB (ref 65–99)
GLUCOSE-CAPILLARY: 192 mg/dL — AB (ref 65–99)
Glucose-Capillary: 101 mg/dL — ABNORMAL HIGH (ref 65–99)
Glucose-Capillary: 151 mg/dL — ABNORMAL HIGH (ref 65–99)
Glucose-Capillary: 176 mg/dL — ABNORMAL HIGH (ref 65–99)

## 2018-05-31 LAB — BASIC METABOLIC PANEL
Anion gap: 7 (ref 5–15)
Anion gap: 10 (ref 5–15)
BUN: 25 mg/dL — AB (ref 6–20)
BUN: 22 mg/dL — AB (ref 6–20)
CHLORIDE: 107 mmol/L (ref 101–111)
CHLORIDE: 107 mmol/L (ref 101–111)
CO2: 29 mmol/L (ref 22–32)
CO2: 28 mmol/L (ref 22–32)
CREATININE: 1.62 mg/dL — AB (ref 0.61–1.24)
Calcium: 8.7 mg/dL — ABNORMAL LOW (ref 8.9–10.3)
Calcium: 9.1 mg/dL (ref 8.9–10.3)
Creatinine, Ser: 1.55 mg/dL — ABNORMAL HIGH (ref 0.61–1.24)
GFR calc Af Amer: 48 mL/min — ABNORMAL LOW (ref >60)
GFR calc Af Amer: 50 mL/min — ABNORMAL LOW (ref >60)
GFR calc non Af Amer: 41 mL/min — ABNORMAL LOW (ref >60)
GFR calc non Af Amer: 43 mL/min — ABNORMAL LOW (ref >60)
GLUCOSE: 160 mg/dL — AB (ref 65–99)
Glucose, Bld: 105 mg/dL — ABNORMAL HIGH (ref 65–99)
POTASSIUM: 3.3 mmol/L — AB (ref 3.5–5.1)
Potassium: 2.8 mmol/L — ABNORMAL LOW (ref 3.5–5.1)
SODIUM: 145 mmol/L (ref 135–145)
Sodium: 143 mmol/L (ref 135–145)

## 2018-05-31 LAB — CBC
HEMATOCRIT: 35.5 % — AB (ref 39.0–52.0)
Hemoglobin: 11.4 g/dL — ABNORMAL LOW (ref 13.0–17.0)
MCH: 29.6 pg (ref 26.0–34.0)
MCHC: 32.1 g/dL (ref 30.0–36.0)
MCV: 92.2 fL (ref 78.0–100.0)
Platelets: 320 10*3/uL (ref 150–400)
RBC: 3.85 MIL/uL — ABNORMAL LOW (ref 4.22–5.81)
RDW: 13.5 % (ref 11.5–15.5)
WBC: 8.1 10*3/uL (ref 4.0–10.5)

## 2018-05-31 LAB — MAGNESIUM: Magnesium: 2.1 mg/dL (ref 1.7–2.4)

## 2018-05-31 MED ORDER — POTASSIUM CHLORIDE CRYS ER 20 MEQ PO TBCR
40.0000 meq | EXTENDED_RELEASE_TABLET | Freq: Once | ORAL | Status: AC
  Administered 2018-05-31: 40 meq via ORAL
  Filled 2018-05-31: qty 2

## 2018-05-31 MED ORDER — POTASSIUM CHLORIDE CRYS ER 20 MEQ PO TBCR: 40.0000 meq | EXTENDED_RELEASE_TABLET | Freq: Two times a day (BID) | ORAL | Status: DC

## 2018-05-31 MED ORDER — CARVEDILOL 12.5 MG PO TABS
12.5000 mg | ORAL_TABLET | Freq: Two times a day (BID) | ORAL | Status: DC
  Administered 2018-05-31 – 2018-06-02 (×5): 12.5 mg via ORAL
  Filled 2018-05-31 (×5): qty 1

## 2018-05-31 MED ORDER — CARVEDILOL 12.5 MG PO TABS: 12.5000 mg | ORAL_TABLET | Freq: Two times a day (BID) | ORAL | Status: DC

## 2018-05-31 MED ORDER — POTASSIUM CHLORIDE CRYS ER 20 MEQ PO TBCR
40.0000 meq | EXTENDED_RELEASE_TABLET | ORAL | Status: AC
  Administered 2018-05-31 (×2): 40 meq via ORAL
  Filled 2018-05-31 (×2): qty 2

## 2018-05-31 NOTE — Discharge Instructions (Signed)

## 2018-05-31 NOTE — Progress Notes (Signed)
[facility] PROGRESS NOTE  Julian Ross ZLD:[id number] DOB: Jun 19, 1946 DOA: 05/28/2018  PCP: Reynold Bowen, MD  Brief History/Interval Summary: 72 year old Caucasian male with a past medical history of dementia, anxiety, depression, type 2 diabetes, coronary artery disease, chronic systolic CHF with a EF of 45%, ischemic cardiomyopathy, persistent atrial fibrillation on anticoagulation, OSA on CPAP, chronic kidney disease stage III presented with increasing confusion and falls.  Patient also was found to have acute kidney injury and atrial fibrillation with RVR.  There was also concern for DKA.  He was hospitalized for further management.  Reason for Visit: Atrial fibrillation with RVR.  Acute renal failure on chronic kidney disease stage III.  Consultants: None  Procedures:  Transthoracic echocardiogram Study Conclusions  - Left ventricle: Inferior and septal hypokinesis. The cavity size   was mildly dilated. Wall thickness was normal. Systolic function   was mildly to moderately reduced. The estimated ejection fraction   was in the range of 40% to 45%. The study is not technically   sufficient to allow evaluation of LV diastolic function. - Mitral valve: There was mild regurgitation. - Left atrium: The atrium was moderately dilated. - Atrial septum: No defect or patent foramen ovale was identified.  Antibiotics: None currently  Subjective/Interval History: Patient pleasantly confused.  Denies any complaints.  Denies any shortness of breath or chest pain.  No nausea vomiting.  ROS: Denies any abdominal pain.  Objective:  Vital Signs  Vitals:   05/30/18 2339 05/31/18 0307 05/31/18 0706 05/31/18 0830  BP: 136/80 137/77 (!) 143/79 137/88  Pulse: 80 84 70 98  Resp: 19 17 17    Temp: 98.4 F (36.9 C) 98.5 F (36.9 C) 98.2 F (36.8 C)   TempSrc: Oral Oral Oral   SpO2: 100% 99% 98%   Weight:      Height:        Intake/Output Summary (Last 24 hours) at 05/31/2018  1117 Last data filed at 05/31/2018 1033 Gross per 24 hour  Intake 3068 ml  Output 4445 ml  Net -1377 ml   Filed Weights   05/28/18 1359  Weight: 91.6 kg (202 lb)    General appearance: alert, cooperative, appears stated age, distracted and no distress Head: Normocephalic, without obvious abnormality, atraumatic Resp: clear to auscultation bilaterally Cardio: S1-S2 is normal regular.  No S3-S4.  No rubs murmurs or bruit. GI: soft, non-tender; bowel sounds normal; no masses,  no organomegaly Extremities: extremities normal, atraumatic, no cyanosis or edema Neurologic: No focal neurological deficits.  He is pleasantly confused.  Lab Results:  Data Reviewed: I have personally reviewed following labs and imaging studies  CBC: Recent Labs  Lab 05/28/18 1402 05/28/18 1440 05/30/18 0251 05/31/18 0302  WBC 17.3*  --  12.4* 8.1  NEUTROABS 13.6*  --   --   --   HGB 15.7 16.3 11.2* 11.4*  HCT 46.1 48.0 34.3* 35.5*  MCV 87.8  --  91.0 92.2  PLT 586*  --  349 903    Basic Metabolic Panel: Recent Labs  Lab 05/28/18 1918 05/28/18 2356 05/29/18 0227 05/29/18 1711 05/30/18 0251 05/31/18 0302  NA 139 145 147*  --  141 143  K 2.6* 3.8 2.5*  --  2.8* 2.8*  CL 93* 102 100*  --  103 107  CO2 25 28 30   --  29 29  GLUCOSE 389* 186* 159*  --  141* 105*  BUN 92* 87* 79*  --  48* 25*  CREATININE 3.93* 3.41* 3.13*  --  1.86* 1.62*  CALCIUM 9.5 9.5 10.0  --  8.5* 8.7*  MG  --   --   --  2.4 2.2 2.1    GFR: Estimated Creatinine Clearance: 48.4 mL/min (A) (by C-G formula based on SCr of 1.62 mg/dL (H)).  Liver Function Tests: Recent Labs  Lab 05/28/18 1402  AST 46*  ALT 18  ALKPHOS 47  BILITOT 1.8*  PROT 9.2*  ALBUMIN 3.5    Cardiac Enzymes: Recent Labs  Lab 05/28/18 1755 05/28/18 1918 05/29/18 1004 05/29/18 1711 05/29/18 2157  CKTOTAL 784*  --   --   --   --   TROPONINI  --  0.17* 0.12* 0.11* 0.07*    BNP (last 3 results) Recent Labs    05/05/18 0939  PROBNP  622*    HbA1C: Recent Labs    05/29/18 1004  HGBA1C 8.3*    CBG: Recent Labs  Lab 05/30/18 1729 05/30/18 2028 05/30/18 2341 05/31/18 0309 05/31/18 1003  GLUCAP 269* 201* 165* 101* 135*    Lipid Profile: Recent Labs    05/29/18 1004  CHOL 98  HDL 17*  LDLCALC 64  TRIG 87  CHOLHDL 5.8     Recent Results (from the past 240 hour(s))  MRSA PCR Screening     Status: Abnormal   Collection Time: 05/28/18  9:52 PM  Result Value Ref Range Status   MRSA by PCR POSITIVE (A) NEGATIVE Final    Comment:        The GeneXpert MRSA Assay (FDA approved for NASAL specimens only), is one component of a comprehensive MRSA colonization surveillance program. It is not intended to diagnose MRSA infection nor to guide or monitor treatment for MRSA infections. RESULT CALLED TO, READ BACK BY AND VERIFIED WITH: Royanne Foots RN 05/29/18 0425 JDW Performed at [facility], [address], [place], [place] [address]       Radiology Studies: No results found.   Medications:  Scheduled: . apixaban  5 mg Oral BID  . carvedilol  12.5 mg Oral BID WC  . Chlorhexidine Gluconate Cloth  6 each Topical Q0600  . clopidogrel  75 mg Oral Daily  . insulin aspart  0-15 Units Subcutaneous Q4H  . insulin glargine  18 Units Subcutaneous Daily  . isosorbide mononitrate  60 mg Oral Daily  . latanoprost  1 drop Both Eyes QHS  . mouth rinse  15 mL Mouth Rinse BID  . mupirocin ointment  1 application Nasal BID  . pantoprazole  40 mg Oral Daily  . potassium chloride  40 mEq Oral Q4H  . predniSONE  5 mg Oral Q breakfast  . ranolazine  1,000 mg Oral BID  . sodium chloride flush  3 mL Intravenous Q12H  . tamsulosin  0.4 mg Oral Daily   Continuous: . sodium chloride 125 mL/hr at 05/30/18 2041  . dextrose 5 % and 0.45% NaCl Stopped (05/29/18 0948)   FBP:ZWCHENIDPOEUM **OR** acetaminophen, ALPRAZolam, nitroGLYCERIN  Assessment/Plan:    Diabetic ketoacidosis/diabetes mellitus type 2  uncontrolled with chronic kidney disease HbA1c 8.3.  Patient is on Lantus which will be continued.  CBGs are reasonably well controlled.  SSI.  Acute metabolic encephalopathy with underlying dementia Mental status appears to be close to baseline.  CT scan was negative for any acute process.  Acute on chronic kidney disease stage III Multifactorial including poor oral intake, DKA.  Patient was gently hydrated.  Renal function is now close to baseline.  Atrial fibrillation with RVR Patient with  a known history of atrial fibrillation.  Was on beta-blocker at home.  Anticoagulated with apixaban at home.  He was on a diltiazem infusion.  Converted to sinus rhythm.  Okay to resume his beta-blocker.  Continue with anticoagulation.  Minimal elevation in troponin likely demand ischemia in the setting of known coronary artery disease Most likely due to acute illness.  Echocardiogram shows EF to be 40 to 45%.  Similar to previous.  He had a cardiac catheterization in November 2018 which showed severe three-vessel disease.  Patient was seen by cardiothoracic surgery at that time and was not thought to be a good candidate for bypass.  Medical management was recommended which will be continued for now.  Chronic systolic CHF EF 40 to 73%.  He is well compensated.  Continue carvedilol.  No ACE inhibitor or ARB due to CKD.  Lactic acidosis Most likely secondary to DKA and dehydration.  No clear evidence for sepsis.  Hypokalemia Replace potassium aggressively.  Magnesium 2.1.  Recheck labs later today.  History of rheumatoid arthritis Continue prednisone which he takes chronically.  DVT Prophylaxis: On apixaban    Code Status: DNR Family Communication: Discussed with patient.  Discussed with his wife over the phone Disposition Plan: Management as outlined above.  Await placement to skilled nursing facility for rehab.    LOS: 3 days   [facility] Pager [phone] 05/31/2018,  11:17 AM  If 7PM-7AM, please contact night-coverage at [email or web address], password [facility]

## 2018-05-31 NOTE — Progress Notes (Signed)
Pt sat up in the chair for breakfast. Ambulated 150 ft  with RN, without significant instability. Back in the room. Sitting up, waiting for lunch tray, with call light in hand. Will continue to monitor pt.

## 2018-06-01 DIAGNOSIS — I4891 Unspecified atrial fibrillation: Secondary | ICD-10-CM

## 2018-06-01 DIAGNOSIS — Z794 Long term (current) use of insulin: Secondary | ICD-10-CM

## 2018-06-01 DIAGNOSIS — E119 Type 2 diabetes mellitus without complications: Secondary | ICD-10-CM

## 2018-06-01 LAB — GLUCOSE, CAPILLARY
GLUCOSE-CAPILLARY: 174 mg/dL — AB (ref 65–99)
GLUCOSE-CAPILLARY: 200 mg/dL — AB (ref 65–99)
Glucose-Capillary: 134 mg/dL — ABNORMAL HIGH (ref 65–99)
Glucose-Capillary: 100 mg/dL — ABNORMAL HIGH (ref 65–99)
Glucose-Capillary: 217 mg/dL — ABNORMAL HIGH (ref 65–99)
Glucose-Capillary: 248 mg/dL — ABNORMAL HIGH (ref 65–99)

## 2018-06-01 LAB — BASIC METABOLIC PANEL
Anion gap: 8 (ref 5–15)
BUN: 19 mg/dL (ref 6–20)
CALCIUM: 8.8 mg/dL — AB (ref 8.9–10.3)
CO2: 26 mmol/L (ref 22–32)
Chloride: 109 mmol/L (ref 101–111)
Creatinine, Ser: 1.51 mg/dL — ABNORMAL HIGH (ref 0.61–1.24)
GFR calc non Af Amer: 45 mL/min — ABNORMAL LOW (ref >60)
GFR, EST AFRICAN AMERICAN: 52 mL/min — AB (ref >60)
Glucose, Bld: 160 mg/dL — ABNORMAL HIGH (ref 65–99)
Potassium: 3.8 mmol/L (ref 3.5–5.1)
SODIUM: 143 mmol/L (ref 135–145)

## 2018-06-01 LAB — MAGNESIUM: MAGNESIUM: 1.8 mg/dL (ref 1.7–2.4)

## 2018-06-01 MED ORDER — POTASSIUM CHLORIDE CRYS ER 20 MEQ PO TBCR
40.0000 meq | EXTENDED_RELEASE_TABLET | Freq: Once | ORAL | Status: AC
  Administered 2018-06-01: 40 meq via ORAL
  Filled 2018-06-01: qty 2

## 2018-06-01 NOTE — Plan of Care (Signed)
  Problem: Clinical Measurements: Goal: Ability to maintain clinical measurements within normal limits will improve Outcome: Progressing   Problem: Safety: Goal: Ability to remain free from injury will improve Outcome: Progressing   

## 2018-06-01 NOTE — Progress Notes (Signed)
[facility] PROGRESS NOTE  Julian Ross GTX:[id number] DOB: 08-01-1946 DOA: 05/28/2018  PCP: Reynold Bowen, MD  Brief History/Interval Summary: 72 year old Caucasian male with a past medical history of dementia, anxiety, depression, type 2 diabetes, coronary artery disease, chronic systolic CHF with a EF of 45%, ischemic cardiomyopathy, persistent atrial fibrillation on anticoagulation, OSA on CPAP, chronic kidney disease stage III presented with increasing confusion and falls.  Patient also was found to have acute kidney injury and atrial fibrillation with RVR.  There was also concern for DKA.  He was hospitalized for further management.  Reason for Visit: Atrial fibrillation with RVR.  Acute renal failure on chronic kidney disease stage III.  Consultants: None  Procedures:  Transthoracic echocardiogram Study Conclusions  - Left ventricle: Inferior and septal hypokinesis. The cavity size   was mildly dilated. Wall thickness was normal. Systolic function   was mildly to moderately reduced. The estimated ejection fraction   was in the range of 40% to 45%. The study is not technically   sufficient to allow evaluation of LV diastolic function. - Mitral valve: There was mild regurgitation. - Left atrium: The atrium was moderately dilated. - Atrial septum: No defect or patent foramen ovale was identified.  Antibiotics: None currently  Subjective/Interval History: Patient remains pleasantly confused.  Denies any complaints.  No shortness of breath or chest pain.  No nausea vomiting.  Reports good appetite.  He sat up in the chair yesterday and even walked a little bit in the hallway.    ROS: Denies any abdominal pain.  Objective:  Vital Signs  Vitals:   05/31/18 1747 05/31/18 2010 05/31/18 2308 06/01/18 0400  BP:  (!) 155/94 (!) 166/96 (!) 150/89  Pulse: 76 82 93 81  Resp:  16 16 (!) 26  Temp:  97.8 F (36.6 C) 97.8 F (36.6 C) 98.3 F (36.8 C)  TempSrc:  Oral Oral  Oral  SpO2:  100% 99% 97%  Weight:      Height:        Intake/Output Summary (Last 24 hours) at 06/01/2018 0807 Last data filed at 06/01/2018 0400 Gross per 24 hour  Intake 1090 ml  Output 2545 ml  Net -1455 ml   Filed Weights   05/28/18 1359  Weight: 91.6 kg (202 lb)    General appearance: Awake alert.  In no distress. Resp: Clear to auscultation bilaterally.  No wheezing rales or rhonchi. Cardio: S1-S2 is normal regular.  No S3-S4.  No rubs murmurs or bruit.  No pedal edema. GI: Abdomen is soft.  Nontender nondistended.  Bowel sounds are present.  No masses organomegaly. Extremities: No significant edema Neurologic: Patient is pleasantly confused.  No obvious focal neurological deficits.  Lab Results:  Data Reviewed: I have personally reviewed following labs and imaging studies  CBC: Recent Labs  Lab 05/28/18 1402 05/28/18 1440 05/30/18 0251 05/31/18 0302  WBC 17.3*  --  12.4* 8.1  NEUTROABS 13.6*  --   --   --   HGB 15.7 16.3 11.2* 11.4*  HCT 46.1 48.0 34.3* 35.5*  MCV 87.8  --  91.0 92.2  PLT 586*  --  349 248    Basic Metabolic Panel: Recent Labs  Lab 05/29/18 0227 05/29/18 1711 05/30/18 0251 05/31/18 0302 05/31/18 1131 06/01/18 0216  NA 147*  --  141 143 145 143  K 2.5*  --  2.8* 2.8* 3.3* 3.8  CL 100*  --  103 107 107 109  CO2 30  --  29 29  28 26  GLUCOSE 159*  --  141* 105* 160* 160*  BUN 79*  --  48* 25* 22* 19  CREATININE 3.13*  --  1.86* 1.62* 1.55* 1.51*  CALCIUM 10.0  --  8.5* 8.7* 9.1 8.8*  MG  --  2.4 2.2 2.1  --  1.8    GFR: Estimated Creatinine Clearance: 51.9 mL/min (A) (by C-G formula based on SCr of 1.51 mg/dL (H)).  Liver Function Tests: Recent Labs  Lab 05/28/18 1402  AST 46*  ALT 18  ALKPHOS 47  BILITOT 1.8*  PROT 9.2*  ALBUMIN 3.5    Cardiac Enzymes: Recent Labs  Lab 05/28/18 1755 05/28/18 1918 05/29/18 1004 05/29/18 1711 05/29/18 2157  CKTOTAL 784*  --   --   --   --   TROPONINI  --  0.17* 0.12* 0.11* 0.07*      BNP (last 3 results) Recent Labs    05/05/18 0939  PROBNP 622*    HbA1C: Recent Labs    05/29/18 1004  HGBA1C 8.3*    CBG: Recent Labs  Lab 05/31/18 1254 05/31/18 1729 05/31/18 2008 05/31/18 2309 06/01/18 0406  GLUCAP 151* 176* 192* 174* 134*    Lipid Profile: Recent Labs    05/29/18 1004  CHOL 98  HDL 17*  LDLCALC 64  TRIG 87  CHOLHDL 5.8     Recent Results (from the past 240 hour(s))  MRSA PCR Screening     Status: Abnormal   Collection Time: 05/28/18  9:52 PM  Result Value Ref Range Status   MRSA by PCR POSITIVE (A) NEGATIVE Final    Comment:        The GeneXpert MRSA Assay (FDA approved for NASAL specimens only), is one component of a comprehensive MRSA colonization surveillance program. It is not intended to diagnose MRSA infection nor to guide or monitor treatment for MRSA infections. RESULT CALLED TO, READ BACK BY AND VERIFIED WITH: Royanne Foots RN 05/29/18 0425 JDW Performed at [facility], [address], [place], [place] [address]       Radiology Studies: No results found.   Medications:  Scheduled: . apixaban  5 mg Oral BID  . carvedilol  12.5 mg Oral BID WC  . Chlorhexidine Gluconate Cloth  6 each Topical Q0600  . clopidogrel  75 mg Oral Daily  . insulin aspart  0-15 Units Subcutaneous Q4H  . insulin glargine  18 Units Subcutaneous Daily  . isosorbide mononitrate  60 mg Oral Daily  . latanoprost  1 drop Both Eyes QHS  . mouth rinse  15 mL Mouth Rinse BID  . mupirocin ointment  1 application Nasal BID  . pantoprazole  40 mg Oral Daily  . potassium chloride  40 mEq Oral Once  . predniSONE  5 mg Oral Q breakfast  . ranolazine  1,000 mg Oral BID  . sodium chloride flush  3 mL Intravenous Q12H  . tamsulosin  0.4 mg Oral Daily   Continuous:  NWG:[id number] **OR** acetaminophen, ALPRAZolam, nitroGLYCERIN  Assessment/Plan:    Diabetic ketoacidosis/diabetes mellitus type 2 uncontrolled with chronic kidney  disease DKA resolved.  HbA1c 8.3.  Patient is on Lantus which will be continued.  CBGs are reasonably well controlled.  SSI.  Acute metabolic encephalopathy with underlying dementia Mental status appears to be close to baseline.  CT scan was negative for any acute process.  Acute on chronic kidney disease stage III Multifactorial including poor oral intake, DKA.  Patient was gently hydrated.  Renal function  is now close to baseline.  8.  Monitor urine output.  Atrial fibrillation with RVR Patient with a known history of atrial fibrillation.  Was on beta-blocker at home.  Anticoagulated with apixaban at home.  He was on a diltiazem infusion.  Converted to sinus rhythm.  Placed back on his beta-blocker.  Continue with anticoagulation.    Minimal elevation in troponin likely demand ischemia in the setting of known coronary artery disease Most likely due to acute illness.  Echocardiogram shows EF to be 40 to 45%.  Similar to previous.  He had a cardiac catheterization in November 2018 which showed severe three-vessel disease.  Patient was seen by cardiothoracic surgery at that time and was not thought to be a good candidate for bypass.  Medical management was recommended which will be continued for now.  Chronic systolic CHF EF 40 to 40%.  He is well compensated.  Continue carvedilol.  No ACE inhibitor or ARB due to CKD.  Lactic acidosis Most likely secondary to DKA and dehydration.  No clear evidence for sepsis.  Hypokalemia Potassium level normal this morning.  Magnesium 1.8.   History of rheumatoid arthritis Continue prednisone which he takes chronically.  DVT Prophylaxis: On apixaban    Code Status: DNR Family Communication: Discussed with the patient.  Discussed with his wife over the phone yesterday. Disposition Plan: Management as outlined above.  Await placement to skilled nursing facility for rehab.    LOS: 4 days   [facility] Pager  [phone] 06/01/2018, 8:07 AM  If 7PM-7AM, please contact night-coverage at [email or web address], password [facility]

## 2018-06-01 NOTE — Progress Notes (Signed)
Pt sat up in the chair most of the day today. Ambulated around the unit ~ 265 ft with RN, without significant instability. Stated he "feel much better today". Sitting up, eating dinner. Advised pt to call if need anything. Will continue to monitor pt.

## 2018-06-02 DIAGNOSIS — I480 Paroxysmal atrial fibrillation: Secondary | ICD-10-CM

## 2018-06-02 DIAGNOSIS — M6281 Muscle weakness (generalized): Secondary | ICD-10-CM | POA: Diagnosis not present

## 2018-06-02 DIAGNOSIS — I209 Angina pectoris, unspecified: Secondary | ICD-10-CM | POA: Diagnosis not present

## 2018-06-02 DIAGNOSIS — D631 Anemia in chronic kidney disease: Secondary | ICD-10-CM | POA: Diagnosis not present

## 2018-06-02 DIAGNOSIS — R2689 Other abnormalities of gait and mobility: Secondary | ICD-10-CM | POA: Diagnosis not present

## 2018-06-02 DIAGNOSIS — E7849 Other hyperlipidemia: Secondary | ICD-10-CM | POA: Diagnosis not present

## 2018-06-02 DIAGNOSIS — Z8739 Personal history of other diseases of the musculoskeletal system and connective tissue: Secondary | ICD-10-CM | POA: Diagnosis not present

## 2018-06-02 DIAGNOSIS — E1110 Type 2 diabetes mellitus with ketoacidosis without coma: Secondary | ICD-10-CM | POA: Diagnosis not present

## 2018-06-02 DIAGNOSIS — F0390 Unspecified dementia without behavioral disturbance: Secondary | ICD-10-CM | POA: Diagnosis not present

## 2018-06-02 DIAGNOSIS — F419 Anxiety disorder, unspecified: Secondary | ICD-10-CM | POA: Diagnosis not present

## 2018-06-02 DIAGNOSIS — K219 Gastro-esophageal reflux disease without esophagitis: Secondary | ICD-10-CM | POA: Diagnosis not present

## 2018-06-02 DIAGNOSIS — N183 Chronic kidney disease, stage 3 (moderate): Secondary | ICD-10-CM | POA: Diagnosis not present

## 2018-06-02 DIAGNOSIS — M1280 Other specific arthropathies, not elsewhere classified, unspecified site: Secondary | ICD-10-CM | POA: Diagnosis not present

## 2018-06-02 DIAGNOSIS — I509 Heart failure, unspecified: Secondary | ICD-10-CM | POA: Diagnosis not present

## 2018-06-02 DIAGNOSIS — R031 Nonspecific low blood-pressure reading: Secondary | ICD-10-CM | POA: Diagnosis not present

## 2018-06-02 DIAGNOSIS — G4730 Sleep apnea, unspecified: Secondary | ICD-10-CM | POA: Diagnosis not present

## 2018-06-02 DIAGNOSIS — F339 Major depressive disorder, recurrent, unspecified: Secondary | ICD-10-CM | POA: Diagnosis not present

## 2018-06-02 DIAGNOSIS — G9340 Encephalopathy, unspecified: Secondary | ICD-10-CM | POA: Diagnosis not present

## 2018-06-02 DIAGNOSIS — E1165 Type 2 diabetes mellitus with hyperglycemia: Secondary | ICD-10-CM | POA: Diagnosis not present

## 2018-06-02 DIAGNOSIS — E872 Acidosis: Secondary | ICD-10-CM | POA: Diagnosis not present

## 2018-06-02 DIAGNOSIS — E876 Hypokalemia: Secondary | ICD-10-CM | POA: Diagnosis not present

## 2018-06-02 DIAGNOSIS — N400 Enlarged prostate without lower urinary tract symptoms: Secondary | ICD-10-CM | POA: Diagnosis not present

## 2018-06-02 DIAGNOSIS — I2510 Atherosclerotic heart disease of native coronary artery without angina pectoris: Secondary | ICD-10-CM | POA: Diagnosis not present

## 2018-06-02 DIAGNOSIS — I5022 Chronic systolic (congestive) heart failure: Secondary | ICD-10-CM | POA: Diagnosis not present

## 2018-06-02 LAB — GLUCOSE, CAPILLARY
GLUCOSE-CAPILLARY: 135 mg/dL — AB (ref 65–99)
GLUCOSE-CAPILLARY: 166 mg/dL — AB (ref 65–99)
GLUCOSE-CAPILLARY: 216 mg/dL — AB (ref 65–99)
Glucose-Capillary: 160 mg/dL — ABNORMAL HIGH (ref 65–99)

## 2018-06-02 MED ORDER — TRAMADOL HCL 50 MG PO TABS
50.0000 mg | ORAL_TABLET | Freq: Three times a day (TID) | ORAL | 0 refills | Status: AC | PRN
Start: 2018-06-02 — End: ?

## 2018-06-02 MED ORDER — INSULIN GLARGINE 100 UNIT/ML ~~LOC~~ SOLN: 18.0000 [IU] | Freq: Every day | SUBCUTANEOUS | 11 refills | Status: DC

## 2018-06-02 MED ORDER — ALPRAZOLAM 0.5 MG PO TABS: 0.5000 mg | ORAL_TABLET | Freq: Every evening | ORAL | 0 refills | Status: AC | PRN

## 2018-06-02 NOTE — Progress Notes (Addendum)
Inpatient Diabetes Program Recommendations  AACE/ADA: New Consensus Statement on Inpatient Glycemic Control (2015)  Target Ranges:  Prepandial:   less than 140 mg/dL      Peak postprandial:   less than 180 mg/dL (1-2 hours)      Critically ill patients:  140 - 180 mg/dL   Lab Results  Component Value Date   GLUCAP 135 (H) 06/02/2018   HGBA1C 8.3 (H) 05/29/2018    Review of Glycemic Control Results for Julian Ross, Julian Ross (MRN [id number]) as of 06/02/2018 08:51  Ref. Range 06/01/2018 20:20 06/02/2018 00:34 06/02/2018 04:37 06/02/2018 08:02  Glucose-Capillary Latest Ref Range: 65 - 99 mg/dL 248 (H) 166 (H) 160 (H) 135 (H)   Diabetes history:Type 2 DM Outpatient Diabetes medications:Humalog 50/50 -50 in AM, 40 units midday, 50 units in PM Current orders for Inpatient glycemic control:Lantus 18 units QD, Novolog 0-15 units Q4H, Prednisone 5 mg QAM  Inpatient Diabetes Program Recommendations:    Consider changing correction to Novolog 0-15 units TID given that patient is eating. If post prandials continue to >180 mg/dL consider adding Novolog 3 units TID (assuming that patient consumes >50%).   Additionally, recommending increase to Lantus to 25 units QD.   Spoke with patient's wife this AM by phone. She plans to come today to see patient. Will plan to see.   Addendum@1350 : Spoke with patient's wife by phone. Information given to follow up with Dr Forde Dandy and that Dr Baldwin Crown CDE can place Dexcom with transmitter at follow up appointment or whenever it is appropriate. Discussed that for financial questions regarding vial/syringe vs. Pen she can call the number on the back of insurance card to determine which would work best. Discussed the safety of both options and given situation of recent caregiver's surgery this may dictate her decision. Again, encouraged to follow up with Dr Forde Dandy for insulin adjustments. No further questions at this time.  Thanks, Bronson Curb, MSN, RNC-OB Diabetes  Coordinator [phone] (8a-5p)

## 2018-06-02 NOTE — Discharge Summary (Signed)
[facility]  Physician Discharge Summary   Patient ID: Julian Ross MRN: [id number] DOB/AGE: 72-02-1946 72 y.o.  Admit date: 05/28/2018 Discharge date: 06/02/2018  PCP: Reynold Bowen, MD  DISCHARGE DIAGNOSES:  Paroxysmal atrial fibrillation Demand ischemia Uncontrolled diabetes mellitus type 2  RECOMMENDATIONS FOR OUTPATIENT FOLLOW UP: 1. CBC and basic metabolic panel on a weekly basis starting Thursday   DISCHARGE CONDITION: fair  Diet recommendation: Modified carbohydrate  Filed Weights   05/28/18 1359  Weight: 91.6 kg (202 lb)    INITIAL HISTORY: 72 year old Caucasian male with a past medical history of dementia, anxiety, depression, type 2 diabetes, coronary artery disease, chronic systolic CHF with a EF of 45%, ischemic cardiomyopathy, persistent atrial fibrillation on anticoagulation, OSA on CPAP, chronic kidney disease stage III presented with increasing confusion and falls.  Patient also was found to have acute kidney injury and atrial fibrillation with RVR.  There was also concern for DKA.  He was hospitalized for further management  Consultants: None  Procedures:  Transthoracic echocardiogram Study Conclusions  - Left ventricle: Inferior and septal hypokinesis. The cavity size was mildly dilated. Wall thickness was normal. Systolic function was mildly to moderately reduced. The estimated ejection fraction was in the range of 40% to 45%. The study is not technically sufficient to allow evaluation of LV diastolic function. - Mitral valve: There was mild regurgitation. - Left atrium: The atrium was moderately dilated. - Atrial septum: No defect or patent foramen ovale was identified.    HOSPITAL COURSE:   Diabetic ketoacidosis/diabetes mellitus type 2 uncontrolled with chronic kidney disease DKA resolved.  HbA1c 8.3.  Patient is on Lantus which will be continued.  CBGs are reasonably well controlled.    Continue to monitor closely at the  skilled nursing facility.  Acute metabolic encephalopathy with underlying dementia Encephalopathy was thought to be secondary to metabolic derangements DKA etc.  Now back to baseline.  CT scan was negative for any acute process.  Acute on chronic kidney disease stage III Multifactorial including poor oral intake, DKA.  Patient was gently hydrated.  Renal function is now close to baseline.    Continue to check labs at the skilled nursing facility.  Atrial fibrillation with RVR Patient with a known history of atrial fibrillation.  Was on beta-blocker at home.  Anticoagulated with apixaban at home.  He was on a diltiazem infusion.  Converted to sinus rhythm.  Placed back on his beta-blocker.  Continue with anticoagulation.    Minimal elevation in troponin likely demand ischemia in the setting of known coronary artery disease Most likely due to acute illness.  Echocardiogram shows EF to be 40 to 45%.  Similar to previous.  He had a cardiac catheterization in November 2018 which showed severe three-vessel disease.  Patient was seen by cardiothoracic surgery at that time and was not thought to be a good candidate for bypass.  Medical management was recommended which will be continued for now.  Chronic systolic CHF EF 40 to 30%.  He is well compensated.  Continue carvedilol.  No ACE inhibitor or ARB due to CKD.  Lactic acidosis Most likely secondary to DKA and dehydration.  No clear evidence for sepsis.  Hypokalemia Patient's potassium was repleted.  Will recommend rechecking the level sometime this week and then on a weekly basis.  History of rheumatoid arthritis Continue prednisone which he takes chronically.  He is DNR.   Overall stable.  Seen by physical therapy.  He is thought to require rehab for his deconditioned status.  Medically stable for discharge today to skilled nursing facility.    PERTINENT LABS:  The results of significant diagnostics from this hospitalization  (including imaging, microbiology, ancillary and laboratory) are listed below for reference.    Microbiology: Recent Results (from the past 240 hour(s))  MRSA PCR Screening     Status: Abnormal   Collection Time: 05/28/18  9:52 PM  Result Value Ref Range Status   MRSA by PCR POSITIVE (A) NEGATIVE Final    Comment:        The GeneXpert MRSA Assay (FDA approved for NASAL specimens only), is one component of a comprehensive MRSA colonization surveillance program. It is not intended to diagnose MRSA infection nor to guide or monitor treatment for MRSA infections. RESULT CALLED TO, READ BACK BY AND VERIFIED WITH: Royanne Foots RN 05/29/18 0425 JDW Performed at [facility], [address] [place], [place] [address]      Labs: Basic Metabolic Panel: Recent Labs  Lab 05/29/18 0227 05/29/18 1711 05/30/18 0251 05/31/18 0302 05/31/18 1131 06/01/18 0216  NA 147*  --  141 143 145 143  K 2.5*  --  2.8* 2.8* 3.3* 3.8  CL 100*  --  103 107 107 109  CO2 30  --  29 29 28 26   GLUCOSE 159*  --  141* 105* 160* 160*  BUN 79*  --  48* 25* 22* 19  CREATININE 3.13*  --  1.86* 1.62* 1.55* 1.51*  CALCIUM 10.0  --  8.5* 8.7* 9.1 8.8*  MG  --  2.4 2.2 2.1  --  1.8   Liver Function Tests: Recent Labs  Lab 05/28/18 1402  AST 46*  ALT 18  ALKPHOS 47  BILITOT 1.8*  PROT 9.2*  ALBUMIN 3.5   CBC: Recent Labs  Lab 05/28/18 1402 05/28/18 1440 05/30/18 0251 05/31/18 0302  WBC 17.3*  --  12.4* 8.1  NEUTROABS 13.6*  --   --   --   HGB 15.7 16.3 11.2* 11.4*  HCT 46.1 48.0 34.3* 35.5*  MCV 87.8  --  91.0 92.2  PLT 586*  --  349 320   Cardiac Enzymes: Recent Labs  Lab 05/28/18 1755 05/28/18 1918 05/29/18 1004 05/29/18 1711 05/29/18 2157  CKTOTAL 784*  --   --   --   --   TROPONINI  --  0.17* 0.12* 0.11* 0.07*   CBG: Recent Labs  Lab 06/01/18 1737 06/01/18 2020 06/02/18 0034 06/02/18 0437 06/02/18 0802  GLUCAP 217* 248* 166* 160* 135*     IMAGING STUDIES Ct Head Wo  Contrast  Result Date: 05/28/2018 CLINICAL DATA:  Altered mental status for 3 days. History of stroke, hypertension, diabetes, hypercholesterolemia, rheumatoid arthritis. EXAM: CT HEAD WITHOUT CONTRAST TECHNIQUE: Contiguous axial images were obtained from the base of the skull through the vertex without intravenous contrast. COMPARISON:  CT HEAD July 27, 2016 FINDINGS: BRAIN: No intraparenchymal hemorrhage, mass effect nor midline shift. Moderate to severe parenchymal brain volume loss. No hydrocephalus. Patchy supratentorial white matter hypodensities less than expected for patient's age, though non-specific are most compatible with chronic small vessel ischemic disease. Small RIGHT frontal and RIGHT parietal encephalomalacia. No acute large vascular territory infarcts. No abnormal extra-axial fluid collections. Basal cisterns are patent. VASCULAR: Severe calcific atherosclerosis of the carotid siphons. SKULL: No skull fracture. No significant scalp soft tissue swelling. SINUSES/ORBITS: Severe acute on chronic paranasal sinusitis, perforated nasal septum. Status post LEFT FESS. Trace RIGHT mastoid effusion. Status post bilateral ocular lens implants. OTHER: None. IMPRESSION:  1. No acute intracranial process. 2. Moderate to severe parenchymal brain volume loss. 3. 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 4. Acute on chronic severe paranasal sinusitis. Electronically Signed   By: Elon Alas M.D.   On: 05/28/2018 16:12   Dg Chest Port 1 View  Result Date: 05/28/2018 CLINICAL DATA:  Altered mental status EXAM: PORTABLE CHEST 1 VIEW COMPARISON:  PA and lateral chest x-ray of November 13, 2017 FINDINGS: The lungs are mildly hypoinflated. There is no focal infiltrate. The heart is top-normal in size. The pulmonary vascularity is normal. There is calcification in the wall of the aortic arch. There is no pleural effusion. IMPRESSION: There is no acute cardiopulmonary abnormality. Chronic  borderline cardiomegaly. Thoracic aortic atherosclerosis. Electronically Signed   By: David  Martinique M.D.   On: 05/28/2018 15:11    DISCHARGE EXAMINATION: Vitals:   06/01/18 2344 06/02/18 0345 06/02/18 0620 06/02/18 0759  BP: (!) 167/95 140/82 137/85 129/71  Pulse: 94 95 97 93  Resp: 17 19  16   Temp: 98.5 F (36.9 C) 99.2 F (37.3 C)  99.3 F (37.4 C)  TempSrc: Oral Oral  Oral  SpO2: 97% 98%  94%  Weight:      Height:       General appearance: alert, cooperative, appears stated age, distracted and no distress Resp: Coarse breath sounds bilaterally.  No wheezing rales or rhonchi.  Normal effort at rest. Cardio: regular rate and rhythm, S1, S2 normal, no murmur, click, rub or gallop GI: soft, non-tender; bowel sounds normal; no masses,  no organomegaly Extremities: extremities normal, atraumatic, no cyanosis or edema Neurologic: Awake alert.  Mildly distracted.  No focal deficits.  DISPOSITION: Skilled nursing facility.  Discharge Instructions    Call MD for:  difficulty breathing, headache or visual disturbances   Complete by:  As directed    Call MD for:  extreme fatigue   Complete by:  As directed    Call MD for:  persistant dizziness or light-headedness   Complete by:  As directed    Call MD for:  persistant nausea and vomiting   Complete by:  As directed    Call MD for:  severe uncontrolled pain   Complete by:  As directed    Call MD for:  temperature >100.4   Complete by:  As directed    Discharge instructions   Complete by:  As directed    Please review instructions on the discharge summary.  You were cared for by a hospitalist during your hospital stay. If you have any questions about your discharge medications or the care you received while you were in the hospital after you are discharged, you can call the unit and asked to speak with the hospitalist on call if the hospitalist that took care of you is not available. Once you are discharged, your primary care  physician will handle any further medical issues. Please note that NO REFILLS for any discharge medications will be authorized once you are discharged, as it is imperative that you return to your primary care physician (or establish a relationship with a primary care physician if you do not have one) for your aftercare needs so that they can reassess your need for medications and monitor your lab values. If you do not have a primary care physician, you can call [phone] for a physician referral.   Increase activity slowly   Complete by:  As directed         Allergies as of 06/02/2018  Reactions   Statins Other (See Comments)   Causes stiffness in joints    Ace Inhibitors Cough   Contrast Media [iodinated Diagnostic Agents] Rash   Has to take benadryl prior to use   Penicillins Hives, Rash   Has patient had a PCN reaction causing immediate rash, facial/tongue/throat swelling, SOB or lightheadedness with hypotension: Yes Has patient had a PCN reaction causing severe rash involving mucus membranes or skin necrosis: No Has patient had a PCN reaction that required hospitalization pt was in the hospital at time of last reaction - heart attack Has patient had a PCN reaction occurring within the last 10 years: No If all of the above answers are "NO", then may proceed with Cephalosporin use.      Medication List    STOP taking these medications   hydrocortisone 2.5 % rectal cream Commonly known as:  ANUSOL-HC   insulin lispro protamine-lispro (50-50) 100 UNIT/ML Susp injection Commonly known as:  HUMALOG 50/50 MIX     TAKE these medications   ALPRAZolam 0.5 MG tablet Commonly known as:  XANAX Take 1 tablet (0.5 mg total) by mouth at bedtime as needed for anxiety or sleep.   apixaban 5 MG Tabs tablet Commonly known as:  ELIQUIS Take 1 tablet (5 mg total) by mouth 2 (two) times daily.   aspirin 81 MG EC tablet Take 1 tablet (81 mg total) daily by mouth.   carvedilol 12.5 MG  tablet Commonly known as:  COREG Take 1 tablet (12.5 mg total) by mouth 2 (two) times daily with a meal.   clopidogrel 75 MG tablet Commonly known as:  PLAVIX Take 1 tablet (75 mg total) by mouth daily.   Evolocumab 140 MG/ML Soaj Commonly known as:  REPATHA SURECLICK Inject 732 mg into the skin every 14 (fourteen) days.   fenofibrate 145 MG tablet Commonly known as:  TRICOR Take 145 mg by mouth daily.   furosemide 80 MG tablet Commonly known as:  LASIX Take 1 tablet (80 mg total) by mouth 2 (two) times daily.   insulin glargine 100 UNIT/ML injection Commonly known as:  LANTUS Inject 0.18 mLs (18 Units total) into the skin daily.   isosorbide mononitrate 60 MG 24 hr tablet Commonly known as:  IMDUR Take 1 tablet (60 mg total) daily by mouth. What changed:  how much to take   leucovorin 10 MG tablet Commonly known as:  WELLCOVORIN Take 10 mg by mouth See admin instructions. Take 1 tablet (10 mg) by mouth every 12 hours and 24 hours after the methotrexate dose   LUMIGAN 0.01 % Soln Generic drug:  bimatoprost Place 1 drop into both eyes at bedtime.   methotrexate (PF) 50 MG/2ML injection Inject 15 mg into the muscle every Friday.   nitroGLYCERIN 0.4 MG SL tablet Commonly known as:  NITROSTAT PLACE 1 TABLET UNDER THE TONGUE IF NEEDED   omega-3 acid ethyl esters 1 g capsule Commonly known as:  LOVAZA Take 1 capsule (1 g total) by mouth 2 (two) times daily.   OSTEO BI-FLEX ADV DOUBLE ST PO Take 1 tablet by mouth daily as needed (takes occassionally).   pantoprazole 40 MG tablet Commonly known as:  PROTONIX TAKE 1 TABLET(40 MG) BY MOUTH DAILY   PARoxetine 40 MG tablet Commonly known as:  PAXIL Take 40 mg by mouth daily.   potassium chloride SA 20 MEQ tablet Commonly known as:  K-DUR,KLOR-CON TAKE 2 TABLETS BY MOUTH TWICE DAILY   predniSONE 5 MG tablet Commonly known as:  DELTASONE Take 5 mg by mouth daily with breakfast.   ranolazine 1000 MG SR  tablet Commonly known as:  RANEXA Take 1 tablet (1,000 mg total) by mouth 2 (two) times daily.   tamsulosin 0.4 MG Caps capsule Commonly known as:  FLOMAX Take 0.4 mg by mouth daily.   tiZANidine 4 MG tablet Commonly known as:  ZANAFLEX Take 1 tablet by mouth every 8 (eight) hours as needed for muscle spasms.   topiramate 25 MG tablet Commonly known as:  TOPAMAX Take 25 mg by mouth 2 (two) times daily.   traMADol 50 MG tablet Commonly known as:  ULTRAM Take 1 tablet (50 mg total) by mouth 3 (three) times daily as needed (pain). Take as directed   VITAMIN D (ERGOCALCIFEROL) PO Take 5,000 Units by mouth daily.   ZETIA 10 MG tablet Generic drug:  ezetimibe Take 10 mg by mouth daily.        Follow-up Information    Reynold Bowen, MD. Schedule an appointment as soon as possible for a visit in 1 week(s).   Specialty:  Endocrinology Contact information: [address] [place] [address] [phone]           TOTAL DISCHARGE TIME: 27 mins  [facility] Pager [phone]  06/02/2018, 11:14 AM

## 2018-06-02 NOTE — Progress Notes (Signed)
Waiting for pts. Wife to arrive to give paperwork. Will discharge once pts family is present for instructions on paperwork.

## 2018-06-02 NOTE — Progress Notes (Signed)
Clinical Social Worker facilitated patient discharge including contacting patient family and facility to confirm patient discharge plans.  Clinical information faxed to facility and family agreeable with plan.  Family stated they will transport patient  to [facility] .  RN to call [phone] (pt will go in room 122 A)  report prior to discharge.  Clinical Social Worker will sign off for now as social work intervention is no longer needed. Please consult [place] again if new need arises.  Rhea Pink, MSW, [place]

## 2018-06-02 NOTE — Consult Note (Signed)
[facility] CM Primary Care Navigator  06/02/2018  Julian Ross April 05, 1946 [id number]   Met withpatient, best friend [name]) at the bedside and was able to speak to patient's wife [name]) over the phone toidentify possible discharge needs.  Wifereportsthat patient had "recurrent falls, more immobile, increased confusion and diabetes- out of control" which all resulted to this admission. (acute kidney injury; atrial fibrillation with rapid ventricular response and concern for DKA- diabetic ketoacidosis).  [place] with [employer] astheprimary care provider.   [place] toobtain medications without current difficulty so far. Patient and wife were made aware to notify and get referral to [facility] care management from primary care provider for any needs with medications (insulin) in the future.   Wifereports thatshehas beenassisting patient in managing hismedications at [employer] use of "pill box" system filled every 3 weeks.  Patient's wife and best friend [name]) have been providing transportationto his doctors'appointments.  Patient's wife has been hisprimary caregiverat home, but she has history of back problems/ surgeries and just had knee surgery that makes her not physically able to assist patient at home.  Anticipated plan for discharge isskilled nursing facility (SNF)for rehabilitationper therapy recommendation.  Patientand wife voiced understandingto callprimarycareprovider'soffice whenhereturns backhome,for a post discharge follow-upvisitwithin1- 2 weeksor sooner if needs arise.Patient letter (with PCP's contact number) was provided asareminder.   Explained topatient and wiferegarding [facility] CM services available for health management/ resourcesat homeandhad voiced interest about it. Wife plans to discuss with primary care provider on his next  visit about further needs in managing patient's health issues when he gets back home.  Patient's wifeverbalizedunderstandingto seekreferral from primary care provider to [facility] care management ifdeemed necessary and appropriatefor anyservicesin the nearfuture,once hereturnsback home.   [facility] care management information was provided for futureneeds thatpatientmay have.  Primary care provider's office is listed as providing transition of care (TOC) follow-up.    For additional questions please contact:  Edwena Felty A. Rudi Knippenberg, BSN, RN-BC [facility] PRIMARY CARE Navigator Cell: [phone]

## 2018-06-06 DIAGNOSIS — E1165 Type 2 diabetes mellitus with hyperglycemia: Secondary | ICD-10-CM | POA: Diagnosis not present

## 2018-06-06 DIAGNOSIS — I480 Paroxysmal atrial fibrillation: Secondary | ICD-10-CM | POA: Diagnosis not present

## 2018-06-06 DIAGNOSIS — I2510 Atherosclerotic heart disease of native coronary artery without angina pectoris: Secondary | ICD-10-CM | POA: Diagnosis not present

## 2018-06-06 DIAGNOSIS — F0390 Unspecified dementia without behavioral disturbance: Secondary | ICD-10-CM | POA: Diagnosis not present

## 2018-06-12 DIAGNOSIS — R031 Nonspecific low blood-pressure reading: Secondary | ICD-10-CM | POA: Diagnosis not present

## 2018-06-12 DIAGNOSIS — N183 Chronic kidney disease, stage 3 (moderate): Secondary | ICD-10-CM | POA: Diagnosis not present

## 2018-06-12 DIAGNOSIS — E876 Hypokalemia: Secondary | ICD-10-CM | POA: Diagnosis not present

## 2018-06-12 DIAGNOSIS — I509 Heart failure, unspecified: Secondary | ICD-10-CM | POA: Diagnosis not present

## 2018-06-19 DIAGNOSIS — M6281 Muscle weakness (generalized): Secondary | ICD-10-CM | POA: Diagnosis not present

## 2018-06-19 DIAGNOSIS — F419 Anxiety disorder, unspecified: Secondary | ICD-10-CM | POA: Diagnosis not present

## 2018-06-19 DIAGNOSIS — I480 Paroxysmal atrial fibrillation: Secondary | ICD-10-CM | POA: Diagnosis not present

## 2018-06-19 DIAGNOSIS — I509 Heart failure, unspecified: Secondary | ICD-10-CM | POA: Diagnosis not present

## 2018-06-19 DIAGNOSIS — E1165 Type 2 diabetes mellitus with hyperglycemia: Secondary | ICD-10-CM | POA: Diagnosis not present

## 2018-06-23 DIAGNOSIS — E11649 Type 2 diabetes mellitus with hypoglycemia without coma: Secondary | ICD-10-CM | POA: Diagnosis not present

## 2018-06-23 DIAGNOSIS — I2510 Atherosclerotic heart disease of native coronary artery without angina pectoris: Secondary | ICD-10-CM | POA: Diagnosis not present

## 2018-06-23 DIAGNOSIS — Z7952 Long term (current) use of systemic steroids: Secondary | ICD-10-CM | POA: Diagnosis not present

## 2018-06-23 DIAGNOSIS — Z7902 Long term (current) use of antithrombotics/antiplatelets: Secondary | ICD-10-CM | POA: Diagnosis not present

## 2018-06-23 DIAGNOSIS — R262 Difficulty in walking, not elsewhere classified: Secondary | ICD-10-CM | POA: Diagnosis not present

## 2018-06-23 DIAGNOSIS — N183 Chronic kidney disease, stage 3 (moderate): Secondary | ICD-10-CM | POA: Diagnosis not present

## 2018-06-23 DIAGNOSIS — I5042 Chronic combined systolic (congestive) and diastolic (congestive) heart failure: Secondary | ICD-10-CM | POA: Diagnosis not present

## 2018-06-23 DIAGNOSIS — I482 Chronic atrial fibrillation: Secondary | ICD-10-CM | POA: Diagnosis not present

## 2018-06-23 DIAGNOSIS — M6281 Muscle weakness (generalized): Secondary | ICD-10-CM | POA: Diagnosis not present

## 2018-06-23 DIAGNOSIS — R41841 Cognitive communication deficit: Secondary | ICD-10-CM | POA: Diagnosis not present

## 2018-06-23 DIAGNOSIS — I1310 Hypertensive heart and chronic kidney disease without heart failure, with stage 1 through stage 4 chronic kidney disease, or unspecified chronic kidney disease: Secondary | ICD-10-CM | POA: Diagnosis not present

## 2018-06-23 DIAGNOSIS — D631 Anemia in chronic kidney disease: Secondary | ICD-10-CM | POA: Diagnosis not present

## 2018-06-23 DIAGNOSIS — Z7901 Long term (current) use of anticoagulants: Secondary | ICD-10-CM | POA: Diagnosis not present

## 2018-06-23 DIAGNOSIS — E1122 Type 2 diabetes mellitus with diabetic chronic kidney disease: Secondary | ICD-10-CM | POA: Diagnosis not present

## 2018-06-23 DIAGNOSIS — Z7982 Long term (current) use of aspirin: Secondary | ICD-10-CM | POA: Diagnosis not present

## 2018-06-24 DIAGNOSIS — I5042 Chronic combined systolic (congestive) and diastolic (congestive) heart failure: Secondary | ICD-10-CM | POA: Diagnosis not present

## 2018-06-24 DIAGNOSIS — I2510 Atherosclerotic heart disease of native coronary artery without angina pectoris: Secondary | ICD-10-CM | POA: Diagnosis not present

## 2018-06-24 DIAGNOSIS — N183 Chronic kidney disease, stage 3 (moderate): Secondary | ICD-10-CM | POA: Diagnosis not present

## 2018-06-24 DIAGNOSIS — E11649 Type 2 diabetes mellitus with hypoglycemia without coma: Secondary | ICD-10-CM | POA: Diagnosis not present

## 2018-06-24 DIAGNOSIS — E1122 Type 2 diabetes mellitus with diabetic chronic kidney disease: Secondary | ICD-10-CM | POA: Diagnosis not present

## 2018-06-24 DIAGNOSIS — I1310 Hypertensive heart and chronic kidney disease without heart failure, with stage 1 through stage 4 chronic kidney disease, or unspecified chronic kidney disease: Secondary | ICD-10-CM | POA: Diagnosis not present

## 2018-06-26 DIAGNOSIS — N183 Chronic kidney disease, stage 3 (moderate): Secondary | ICD-10-CM | POA: Diagnosis not present

## 2018-06-26 DIAGNOSIS — I2510 Atherosclerotic heart disease of native coronary artery without angina pectoris: Secondary | ICD-10-CM | POA: Diagnosis not present

## 2018-06-26 DIAGNOSIS — E11649 Type 2 diabetes mellitus with hypoglycemia without coma: Secondary | ICD-10-CM | POA: Diagnosis not present

## 2018-06-26 DIAGNOSIS — E1122 Type 2 diabetes mellitus with diabetic chronic kidney disease: Secondary | ICD-10-CM | POA: Diagnosis not present

## 2018-06-26 DIAGNOSIS — I1310 Hypertensive heart and chronic kidney disease without heart failure, with stage 1 through stage 4 chronic kidney disease, or unspecified chronic kidney disease: Secondary | ICD-10-CM | POA: Diagnosis not present

## 2018-06-26 DIAGNOSIS — I5042 Chronic combined systolic (congestive) and diastolic (congestive) heart failure: Secondary | ICD-10-CM | POA: Diagnosis not present

## 2018-06-30 DIAGNOSIS — I2510 Atherosclerotic heart disease of native coronary artery without angina pectoris: Secondary | ICD-10-CM | POA: Diagnosis not present

## 2018-06-30 DIAGNOSIS — I1310 Hypertensive heart and chronic kidney disease without heart failure, with stage 1 through stage 4 chronic kidney disease, or unspecified chronic kidney disease: Secondary | ICD-10-CM | POA: Diagnosis not present

## 2018-06-30 DIAGNOSIS — I5042 Chronic combined systolic (congestive) and diastolic (congestive) heart failure: Secondary | ICD-10-CM | POA: Diagnosis not present

## 2018-06-30 DIAGNOSIS — E1122 Type 2 diabetes mellitus with diabetic chronic kidney disease: Secondary | ICD-10-CM | POA: Diagnosis not present

## 2018-06-30 DIAGNOSIS — N183 Chronic kidney disease, stage 3 (moderate): Secondary | ICD-10-CM | POA: Diagnosis not present

## 2018-06-30 DIAGNOSIS — E11649 Type 2 diabetes mellitus with hypoglycemia without coma: Secondary | ICD-10-CM | POA: Diagnosis not present

## 2018-07-01 DIAGNOSIS — I5042 Chronic combined systolic (congestive) and diastolic (congestive) heart failure: Secondary | ICD-10-CM | POA: Diagnosis not present

## 2018-07-01 DIAGNOSIS — E1122 Type 2 diabetes mellitus with diabetic chronic kidney disease: Secondary | ICD-10-CM | POA: Diagnosis not present

## 2018-07-01 DIAGNOSIS — I2510 Atherosclerotic heart disease of native coronary artery without angina pectoris: Secondary | ICD-10-CM | POA: Diagnosis not present

## 2018-07-01 DIAGNOSIS — E11649 Type 2 diabetes mellitus with hypoglycemia without coma: Secondary | ICD-10-CM | POA: Diagnosis not present

## 2018-07-01 DIAGNOSIS — I1310 Hypertensive heart and chronic kidney disease without heart failure, with stage 1 through stage 4 chronic kidney disease, or unspecified chronic kidney disease: Secondary | ICD-10-CM | POA: Diagnosis not present

## 2018-07-01 DIAGNOSIS — N183 Chronic kidney disease, stage 3 (moderate): Secondary | ICD-10-CM | POA: Diagnosis not present

## 2018-07-02 ENCOUNTER — Emergency Department (HOSPITAL_COMMUNITY): Payer: Medicare Other

## 2018-07-02 ENCOUNTER — Observation Stay (HOSPITAL_COMMUNITY)
Admission: EM | Admit: 2018-07-02 | Discharge: 2018-07-04 | Disposition: A | Discharge status: Home / Self Care | Payer: Medicare Other | Attending: Internal Medicine | Admitting: Internal Medicine

## 2018-07-02 ENCOUNTER — Encounter (HOSPITAL_COMMUNITY): Payer: Self-pay | Admitting: Emergency Medicine

## 2018-07-02 ENCOUNTER — Other Ambulatory Visit: Payer: Self-pay

## 2018-07-02 DIAGNOSIS — E162 Hypoglycemia, unspecified: Secondary | ICD-10-CM | POA: Diagnosis present

## 2018-07-02 DIAGNOSIS — Z888 Allergy status to other drugs, medicaments and biological substances status: Secondary | ICD-10-CM | POA: Diagnosis not present

## 2018-07-02 DIAGNOSIS — M059 Rheumatoid arthritis with rheumatoid factor, unspecified: Secondary | ICD-10-CM | POA: Diagnosis not present

## 2018-07-02 DIAGNOSIS — Z7901 Long term (current) use of anticoagulants: Secondary | ICD-10-CM | POA: Insufficient documentation

## 2018-07-02 DIAGNOSIS — I2510 Atherosclerotic heart disease of native coronary artery without angina pectoris: Secondary | ICD-10-CM | POA: Insufficient documentation

## 2018-07-02 DIAGNOSIS — M1990 Unspecified osteoarthritis, unspecified site: Secondary | ICD-10-CM | POA: Diagnosis not present

## 2018-07-02 DIAGNOSIS — E559 Vitamin D deficiency, unspecified: Secondary | ICD-10-CM | POA: Insufficient documentation

## 2018-07-02 DIAGNOSIS — E1122 Type 2 diabetes mellitus with diabetic chronic kidney disease: Secondary | ICD-10-CM | POA: Insufficient documentation

## 2018-07-02 DIAGNOSIS — I420 Dilated cardiomyopathy: Secondary | ICD-10-CM | POA: Insufficient documentation

## 2018-07-02 DIAGNOSIS — I5042 Chronic combined systolic (congestive) and diastolic (congestive) heart failure: Secondary | ICD-10-CM | POA: Diagnosis not present

## 2018-07-02 DIAGNOSIS — Z91041 Radiographic dye allergy status: Secondary | ICD-10-CM | POA: Insufficient documentation

## 2018-07-02 DIAGNOSIS — M797 Fibromyalgia: Secondary | ICD-10-CM | POA: Diagnosis not present

## 2018-07-02 DIAGNOSIS — I493 Ventricular premature depolarization: Secondary | ICD-10-CM | POA: Diagnosis not present

## 2018-07-02 DIAGNOSIS — I491 Atrial premature depolarization: Secondary | ICD-10-CM | POA: Diagnosis not present

## 2018-07-02 DIAGNOSIS — I255 Ischemic cardiomyopathy: Secondary | ICD-10-CM | POA: Insufficient documentation

## 2018-07-02 DIAGNOSIS — D631 Anemia in chronic kidney disease: Secondary | ICD-10-CM | POA: Diagnosis present

## 2018-07-02 DIAGNOSIS — N183 Chronic kidney disease, stage 3 unspecified: Secondary | ICD-10-CM | POA: Diagnosis present

## 2018-07-02 DIAGNOSIS — I252 Old myocardial infarction: Secondary | ICD-10-CM | POA: Diagnosis not present

## 2018-07-02 DIAGNOSIS — Z880 Allergy status to penicillin: Secondary | ICD-10-CM | POA: Insufficient documentation

## 2018-07-02 DIAGNOSIS — G4733 Obstructive sleep apnea (adult) (pediatric): Secondary | ICD-10-CM | POA: Diagnosis not present

## 2018-07-02 DIAGNOSIS — I130 Hypertensive heart and chronic kidney disease with heart failure and stage 1 through stage 4 chronic kidney disease, or unspecified chronic kidney disease: Secondary | ICD-10-CM | POA: Insufficient documentation

## 2018-07-02 DIAGNOSIS — F329 Major depressive disorder, single episode, unspecified: Secondary | ICD-10-CM | POA: Insufficient documentation

## 2018-07-02 DIAGNOSIS — Z79899 Other long term (current) drug therapy: Secondary | ICD-10-CM | POA: Insufficient documentation

## 2018-07-02 DIAGNOSIS — I481 Persistent atrial fibrillation: Secondary | ICD-10-CM | POA: Insufficient documentation

## 2018-07-02 DIAGNOSIS — E7800 Pure hypercholesterolemia, unspecified: Secondary | ICD-10-CM | POA: Insufficient documentation

## 2018-07-02 DIAGNOSIS — F419 Anxiety disorder, unspecified: Secondary | ICD-10-CM | POA: Diagnosis not present

## 2018-07-02 DIAGNOSIS — I1310 Hypertensive heart and chronic kidney disease without heart failure, with stage 1 through stage 4 chronic kidney disease, or unspecified chronic kidney disease: Secondary | ICD-10-CM | POA: Diagnosis not present

## 2018-07-02 DIAGNOSIS — Z7902 Long term (current) use of antithrombotics/antiplatelets: Secondary | ICD-10-CM | POA: Insufficient documentation

## 2018-07-02 DIAGNOSIS — Z8611 Personal history of tuberculosis: Secondary | ICD-10-CM | POA: Insufficient documentation

## 2018-07-02 DIAGNOSIS — Z794 Long term (current) use of insulin: Secondary | ICD-10-CM | POA: Insufficient documentation

## 2018-07-02 DIAGNOSIS — E1110 Type 2 diabetes mellitus with ketoacidosis without coma: Secondary | ICD-10-CM | POA: Insufficient documentation

## 2018-07-02 DIAGNOSIS — J984 Other disorders of lung: Secondary | ICD-10-CM | POA: Diagnosis not present

## 2018-07-02 DIAGNOSIS — N189 Chronic kidney disease, unspecified: Secondary | ICD-10-CM

## 2018-07-02 DIAGNOSIS — Z9989 Dependence on other enabling machines and devices: Secondary | ICD-10-CM | POA: Insufficient documentation

## 2018-07-02 DIAGNOSIS — I5032 Chronic diastolic (congestive) heart failure: Secondary | ICD-10-CM | POA: Diagnosis not present

## 2018-07-02 DIAGNOSIS — E161 Other hypoglycemia: Secondary | ICD-10-CM | POA: Diagnosis not present

## 2018-07-02 DIAGNOSIS — E119 Type 2 diabetes mellitus without complications: Secondary | ICD-10-CM

## 2018-07-02 DIAGNOSIS — I10 Essential (primary) hypertension: Secondary | ICD-10-CM

## 2018-07-02 DIAGNOSIS — R4182 Altered mental status, unspecified: Secondary | ICD-10-CM | POA: Diagnosis not present

## 2018-07-02 DIAGNOSIS — E11649 Type 2 diabetes mellitus with hypoglycemia without coma: Principal | ICD-10-CM | POA: Insufficient documentation

## 2018-07-02 DIAGNOSIS — Z7982 Long term (current) use of aspirin: Secondary | ICD-10-CM | POA: Insufficient documentation

## 2018-07-02 DIAGNOSIS — R402441 Other coma, without documented Glasgow coma scale score, or with partial score reported, in the field [EMT or ambulance]: Secondary | ICD-10-CM | POA: Diagnosis not present

## 2018-07-02 DIAGNOSIS — I447 Left bundle-branch block, unspecified: Secondary | ICD-10-CM | POA: Diagnosis not present

## 2018-07-02 DIAGNOSIS — Z8249 Family history of ischemic heart disease and other diseases of the circulatory system: Secondary | ICD-10-CM | POA: Insufficient documentation

## 2018-07-02 LAB — CBG MONITORING, ED
GLUCOSE-CAPILLARY: 69 mg/dL — AB (ref 70–99)
GLUCOSE-CAPILLARY: 76 mg/dL (ref 70–99)
Glucose-Capillary: 27 mg/dL — CL (ref 70–99)
Glucose-Capillary: 96 mg/dL (ref 70–99)
Glucose-Capillary: 71 mg/dL (ref 70–99)
Glucose-Capillary: 90 mg/dL (ref 70–99)

## 2018-07-02 LAB — GLUCOSE, CAPILLARY
GLUCOSE-CAPILLARY: 68 mg/dL — AB (ref 70–99)
GLUCOSE-CAPILLARY: 97 mg/dL (ref 70–99)

## 2018-07-02 LAB — URINALYSIS, ROUTINE W REFLEX MICROSCOPIC
BILIRUBIN URINE: NEGATIVE
Glucose, UA: NEGATIVE mg/dL
HGB URINE DIPSTICK: NEGATIVE
KETONES UR: NEGATIVE mg/dL
Leukocytes, UA: NEGATIVE
NITRITE: NEGATIVE
PROTEIN: NEGATIVE mg/dL
Specific Gravity, Urine: 1.004 — ABNORMAL LOW (ref 1.005–1.030)
pH: 7 (ref 5.0–8.0)

## 2018-07-02 LAB — CBC
HEMATOCRIT: 33.1 % — AB (ref 39.0–52.0)
HEMOGLOBIN: 10.8 g/dL — AB (ref 13.0–17.0)
MCH: 30.4 pg (ref 26.0–34.0)
MCHC: 32.6 g/dL (ref 30.0–36.0)
MCV: 93.2 fL (ref 78.0–100.0)
Platelets: 300 10*3/uL (ref 150–400)
RBC: 3.55 MIL/uL — ABNORMAL LOW (ref 4.22–5.81)
RDW: 14.5 % (ref 11.5–15.5)
WBC: 13.8 10*3/uL — ABNORMAL HIGH (ref 4.0–10.5)

## 2018-07-02 LAB — COMPREHENSIVE METABOLIC PANEL
ALT: 16 U/L (ref 0–44)
AST: 34 U/L (ref 15–41)
Albumin: 3.4 g/dL — ABNORMAL LOW (ref 3.5–5.0)
Alkaline Phosphatase: 42 U/L (ref 38–126)
Anion gap: 11 (ref 5–15)
BILIRUBIN TOTAL: 0.8 mg/dL (ref 0.3–1.2)
BUN: 17 mg/dL (ref 8–23)
CALCIUM: 9.3 mg/dL (ref 8.9–10.3)
CO2: 27 mmol/L (ref 22–32)
CREATININE: 1.95 mg/dL — AB (ref 0.61–1.24)
Chloride: 99 mmol/L (ref 98–111)
GFR calc non Af Amer: 33 mL/min — ABNORMAL LOW (ref >60)
GFR, EST AFRICAN AMERICAN: 38 mL/min — AB (ref >60)
Glucose, Bld: 32 mg/dL — CL (ref 70–99)
Potassium: 3.9 mmol/L (ref 3.5–5.1)
Sodium: 137 mmol/L (ref 135–145)
TOTAL PROTEIN: 7.7 g/dL (ref 6.5–8.1)

## 2018-07-02 LAB — I-STAT CHEM 8, ED
BUN: 20 mg/dL (ref 8–23)
CREATININE: 1.8 mg/dL — AB (ref 0.61–1.24)
Calcium, Ion: 1.15 mmol/L (ref 1.15–1.40)
Chloride: 101 mmol/L (ref 98–111)
GLUCOSE: 35 mg/dL — AB (ref 70–99)
HCT: 36 % — ABNORMAL LOW (ref 39.0–52.0)
HEMOGLOBIN: 12.2 g/dL — AB (ref 13.0–17.0)
POTASSIUM: 3.9 mmol/L (ref 3.5–5.1)
Sodium: 135 mmol/L (ref 135–145)
TCO2: 24 mmol/L (ref 22–32)

## 2018-07-02 MED ORDER — FUROSEMIDE 80 MG PO TABS
80.0000 mg | ORAL_TABLET | Freq: Two times a day (BID) | ORAL | Status: DC
  Administered 2018-07-03: 80 mg via ORAL
  Filled 2018-07-02: qty 1

## 2018-07-02 MED ORDER — TOPIRAMATE 25 MG PO TABS
25.0000 mg | ORAL_TABLET | Freq: Two times a day (BID) | ORAL | Status: DC
  Administered 2018-07-02 – 2018-07-04 (×4): 25 mg via ORAL
  Filled 2018-07-02 (×4): qty 1

## 2018-07-02 MED ORDER — ISOSORBIDE MONONITRATE ER 60 MG PO TB24
60.0000 mg | ORAL_TABLET | Freq: Every day | ORAL | Status: DC
  Administered 2018-07-03 – 2018-07-04 (×2): 60 mg via ORAL
  Filled 2018-07-02 (×2): qty 1

## 2018-07-02 MED ORDER — APIXABAN 5 MG PO TABS
5.0000 mg | ORAL_TABLET | Freq: Two times a day (BID) | ORAL | Status: DC
  Administered 2018-07-02 – 2018-07-04 (×4): 5 mg via ORAL
  Filled 2018-07-02 (×4): qty 1

## 2018-07-02 MED ORDER — PREDNISONE 5 MG PO TABS
5.0000 mg | ORAL_TABLET | Freq: Every day | ORAL | Status: DC
  Administered 2018-07-03 – 2018-07-04 (×2): 5 mg via ORAL
  Filled 2018-07-02 (×2): qty 1

## 2018-07-02 MED ORDER — ENOXAPARIN SODIUM 40 MG/0.4ML ~~LOC~~ SOLN: 40.0000 mg | SUBCUTANEOUS | Status: DC

## 2018-07-02 MED ORDER — EZETIMIBE 10 MG PO TABS
10.0000 mg | ORAL_TABLET | Freq: Every day | ORAL | Status: DC
  Administered 2018-07-03 – 2018-07-04 (×2): 10 mg via ORAL
  Filled 2018-07-02 (×2): qty 1

## 2018-07-02 MED ORDER — ONDANSETRON HCL 4 MG/2ML IJ SOLN: 4.0000 mg | Freq: Four times a day (QID) | INTRAMUSCULAR | Status: DC | PRN

## 2018-07-02 MED ORDER — TIZANIDINE HCL 4 MG PO TABS: 4.0000 mg | ORAL_TABLET | Freq: Three times a day (TID) | ORAL | Status: DC | PRN

## 2018-07-02 MED ORDER — OMEGA-3-ACID ETHYL ESTERS 1 G PO CAPS
1.0000 | ORAL_CAPSULE | Freq: Two times a day (BID) | ORAL | Status: DC
  Administered 2018-07-02 – 2018-07-04 (×4): 1 g via ORAL
  Filled 2018-07-02 (×4): qty 1

## 2018-07-02 MED ORDER — RANOLAZINE ER 500 MG PO TB12
1000.0000 mg | ORAL_TABLET | Freq: Two times a day (BID) | ORAL | Status: DC
  Administered 2018-07-02 – 2018-07-04 (×4): 1000 mg via ORAL
  Filled 2018-07-02 (×4): qty 2

## 2018-07-02 MED ORDER — ASPIRIN EC 81 MG PO TBEC
81.0000 mg | DELAYED_RELEASE_TABLET | Freq: Every day | ORAL | Status: DC
  Administered 2018-07-03 – 2018-07-04 (×2): 81 mg via ORAL
  Filled 2018-07-02 (×2): qty 1

## 2018-07-02 MED ORDER — ONDANSETRON HCL 4 MG PO TABS: 4.0000 mg | ORAL_TABLET | Freq: Four times a day (QID) | ORAL | Status: DC | PRN

## 2018-07-02 MED ORDER — EVOLOCUMAB 140 MG/ML ~~LOC~~ SOAJ: 140.0000 mg | SUBCUTANEOUS | Status: DC

## 2018-07-02 MED ORDER — CLOPIDOGREL BISULFATE 75 MG PO TABS
75.0000 mg | ORAL_TABLET | Freq: Every day | ORAL | Status: DC
  Administered 2018-07-03 – 2018-07-04 (×2): 75 mg via ORAL
  Filled 2018-07-02 (×2): qty 1

## 2018-07-02 MED ORDER — TAMSULOSIN HCL 0.4 MG PO CAPS
0.4000 mg | ORAL_CAPSULE | Freq: Every day | ORAL | Status: DC
  Administered 2018-07-03 – 2018-07-04 (×2): 0.4 mg via ORAL
  Filled 2018-07-02 (×2): qty 1

## 2018-07-02 MED ORDER — FENOFIBRATE 160 MG PO TABS
160.0000 mg | ORAL_TABLET | Freq: Every day | ORAL | Status: DC
  Administered 2018-07-03 – 2018-07-04 (×2): 160 mg via ORAL
  Filled 2018-07-02 (×2): qty 1

## 2018-07-02 MED ORDER — POTASSIUM CHLORIDE CRYS ER 20 MEQ PO TBCR
40.0000 meq | EXTENDED_RELEASE_TABLET | Freq: Two times a day (BID) | ORAL | Status: DC
  Administered 2018-07-02 – 2018-07-03 (×3): 40 meq via ORAL
  Filled 2018-07-02 (×3): qty 2

## 2018-07-02 MED ORDER — DEXTROSE 50 % IV SOLN
1.0000 | Freq: Once | INTRAVENOUS | Status: AC
  Administered 2018-07-02: 50 mL via INTRAVENOUS

## 2018-07-02 MED ORDER — TRAMADOL HCL 50 MG PO TABS
50.0000 mg | ORAL_TABLET | Freq: Three times a day (TID) | ORAL | Status: DC | PRN
  Administered 2018-07-04: 50 mg via ORAL
  Filled 2018-07-02: qty 1

## 2018-07-02 MED ORDER — SODIUM CHLORIDE 0.9% FLUSH: 3.0000 mL | INTRAVENOUS | Status: DC | PRN

## 2018-07-02 MED ORDER — CARVEDILOL 12.5 MG PO TABS
12.5000 mg | ORAL_TABLET | Freq: Two times a day (BID) | ORAL | Status: DC
  Administered 2018-07-03 – 2018-07-04 (×3): 12.5 mg via ORAL
  Filled 2018-07-02 (×3): qty 1

## 2018-07-02 MED ORDER — PAROXETINE HCL 20 MG PO TABS
40.0000 mg | ORAL_TABLET | Freq: Every day | ORAL | Status: DC
  Administered 2018-07-03 – 2018-07-04 (×2): 40 mg via ORAL
  Filled 2018-07-02 (×2): qty 2

## 2018-07-02 MED ORDER — SODIUM CHLORIDE 0.9% FLUSH
3.0000 mL | Freq: Two times a day (BID) | INTRAVENOUS | Status: DC
  Administered 2018-07-03 – 2018-07-04 (×2): 3 mL via INTRAVENOUS

## 2018-07-02 MED ORDER — DEXTROSE 50 % IV SOLN
INTRAVENOUS | Status: AC
  Filled 2018-07-02: qty 50

## 2018-07-02 MED ORDER — DEXTROSE 10 % IV SOLN
INTRAVENOUS | Status: DC
  Administered 2018-07-02: 20:00:00 via INTRAVENOUS

## 2018-07-02 MED ORDER — VITAMIN D 1000 UNITS PO TABS
5000.0000 [IU] | ORAL_TABLET | Freq: Every day | ORAL | Status: DC
  Administered 2018-07-03 – 2018-07-04 (×2): 5000 [IU] via ORAL
  Filled 2018-07-02 (×2): qty 5

## 2018-07-02 MED ORDER — INSULIN ASPART 100 UNIT/ML ~~LOC~~ SOLN: 0.0000 [IU] | Freq: Every day | SUBCUTANEOUS | Status: DC

## 2018-07-02 MED ORDER — INSULIN ASPART 100 UNIT/ML ~~LOC~~ SOLN
0.0000 [IU] | SUBCUTANEOUS | Status: AC
  Administered 2018-07-03: 1 [IU] via SUBCUTANEOUS

## 2018-07-02 MED ORDER — ALPRAZOLAM 0.5 MG PO TABS
0.5000 mg | ORAL_TABLET | Freq: Every evening | ORAL | Status: DC | PRN
Start: 2018-07-02 — End: 2018-07-04

## 2018-07-02 MED ORDER — NITROGLYCERIN 0.4 MG SL SUBL: 0.4000 mg | SUBLINGUAL_TABLET | SUBLINGUAL | Status: DC | PRN

## 2018-07-02 MED ORDER — DEXTROSE 10 % IV SOLN: INTRAVENOUS | Status: DC

## 2018-07-02 MED ORDER — PANTOPRAZOLE SODIUM 40 MG PO TBEC
40.0000 mg | DELAYED_RELEASE_TABLET | Freq: Every day | ORAL | Status: DC
  Administered 2018-07-03 – 2018-07-04 (×2): 40 mg via ORAL
  Filled 2018-07-02 (×2): qty 1

## 2018-07-02 MED ORDER — SODIUM CHLORIDE 0.9 % IV SOLN: 250.0000 mL | INTRAVENOUS | Status: DC | PRN

## 2018-07-02 MED ORDER — LATANOPROST 0.005 % OP SOLN
1.0000 [drp] | Freq: Every day | OPHTHALMIC | Status: DC
  Administered 2018-07-02 – 2018-07-03 (×2): 1 [drp] via OPHTHALMIC
  Filled 2018-07-02: qty 2.5

## 2018-07-02 NOTE — ED Provider Notes (Signed)
[facility] EMERGENCY DEPARTMENT Provider Note   CSN: [id number] Arrival date & time: 07/02/18  1752     History   Chief Complaint Chief Complaint  Patient presents with  . Hypoglycemia    HPI Julian Ross is a 72 y.o. male with a pmh of CAD s/p 16 DES, Ischemic cardiomyopathy, persistent afib, ckd, type 2 IDDM. The patient states that today he ate a can of soup and took 40 units of insulin. He states that his CBG prior to eating was 128. He says that soon after taking his insulin he "began to tank." His son who was with him noticed he was having altered mental status and called EMS. He was found to have a cbg of 18 and was given IM glucagon, and 25g D10  . He came up to 71 pta.  In the ER patient dropped again to CBG of 20 and was given and amp of d50.The patient denies fevers, cough, cp, nausea, vomiting, diarrhea, abdominal pain, urinary sxs.   HPI  Past Medical History:  Diagnosis Date  . Anemia   . Anxiety state   . CAD (coronary artery disease)    16 prior stents (at [facility])  . Cardiomyopathy, ischemic   . Chronic lower back pain   . Chronic systolic CHF (congestive heart failure) ([place])   . CKD (chronic kidney disease), stage III ([place])   . Depression   . Diabetes mellitus type 2 in obese ([place])   . Fibromyalgia   . Hepatitis A    at age 42  . High cholesterol   . History of blood transfusion    "related to OR"  . Hypertension   . Hypokalemia   . MI (myocardial infarction) ([place]) 1990; 1995; 1997  . OSA on CPAP    setting = 13, full face mask  . Osteoarthritis   . Persistent atrial fibrillation ([place]) 02/2016   on Eliquis  . Rheumatoid arthritis of multiple sites with negative rheumatoid factor ([place])   . Septic shock ([place]) 02/2016   in setting of severe pneumonia    Patient Active Problem List   Diagnosis Date Noted  . Atrial fibrillation with RVR ([place]) 05/28/2018  . DKA (diabetic ketoacidoses) ([place]) 05/28/2018  . Demand ischemia  ([place]) 05/28/2018  . Abscess 11/28/2017  . Anemia 11/28/2017  . Anxiety 11/28/2017  . Ischemic cardiomyopathy 11/28/2017  . Chronic back pain 11/28/2017  . Depression 11/28/2017  . DOE (dyspnea on exertion) 11/28/2017  . Leg edema 11/28/2017  . Essential tremor 11/28/2017  . GERD (gastroesophageal reflux disease) 11/28/2017  . Hepatitis A 11/28/2017  . HOH (hard of hearing) 11/28/2017  . Insomnia 11/28/2017  . Lower urinary tract symptoms (LUTS) 11/28/2017  . Mononucleosis 11/28/2017  . Neuromuscular disorder ([place]) 11/28/2017  . Neuropathy 11/28/2017  . Post-nasal drip 11/28/2017  . Sinusitis 11/28/2017  . Spinal stenosis 11/28/2017  . TB (tuberculosis) 11/28/2017  . Transfusion history 11/28/2017  . Vitamin D insufficiency 11/28/2017  . ARDS (adult respiratory distress syndrome) ([place]) 11/28/2017  . MI (myocardial infarction) ([place]) 11/28/2017  . Congestive cardiomyopathy ([place]) 11/28/2017  . Coronary artery disease 11/28/2017  . ASHD (arteriosclerotic heart disease) 11/28/2017  . DM (diabetes mellitus) ([place]) 11/28/2017  . Arthritis 11/28/2017  . RA (rheumatoid arthritis) ([place]) 11/28/2017  . Hypokalemia 11/14/2017  . Chest pain 11/13/2017  . Pure hypercholesterolemia 04/23/2017  . Chronic anticoagulation 03/21/2017  . HCAP (healthcare-associated pneumonia) 10/30/2016  . Hyponatremia 10/30/2016  . OSA on CPAP 10/30/2016  .  Sepsis due to pneumonia ([place]) 10/30/2016  . Diabetes mellitus type 2, insulin dependent ([place]) 10/30/2016  . Acute respiratory failure with hypoxia ([place])   . Angina pectoris, crescendo ([place]) 10/03/2016  . Knee effusion   . Syncope 07/20/2016  . Chronic combined systolic and diastolic CHF (congestive heart failure) ([place]) 07/19/2016  . CKD (chronic kidney disease) stage 3, GFR 30-59 ml/min (H[facility]) 07/19/2016  . Leukocytosis 07/19/2016  . Anemia of chronic kidney failure 07/19/2016  . Uncontrolled diabetes mellitus with diabetic nephropathy, with long-term  current use of insulin ([place]) 07/19/2016  . Benign essential HTN 07/19/2016  . Dyslipidemia associated with type 2 diabetes mellitus ([place]) 07/19/2016  . Paroxysmal atrial fibrillation ([facility])   . CAD S/P percutaneous coronary angioplasty 04/16/2016  . Rheumatoid arthritis of multiple sites with negative rheumatoid factor ([place])   . S/P lumbar fusion 04/16/2014  . Radicular pain 04/16/2014  . Lumbar stenosis 04/09/2014  . Aftercare following surgery of the musculoskeletal system, NEC 03/10/2014  . History of lumbar laminectomy for spinal cord decompression 12/29/2013  . Renal insufficiency 12/03/2013  . Herniated lumbar intervertebral disc 11/17/2013  . Spinal stenosis of lumbar region with neurogenic claudication 11/11/2013  . Pre-syncope 01/12/2013  . AKI (acute kidney injury) ([place]) 01/12/2013    Past Surgical History:  Procedure Laterality Date  . BACK SURGERY    . CARDIAC CATHETERIZATION N/A 10/03/2016   Procedure: Left Heart Cath and Coronary Angiography;  Surgeon: Belva Crome, MD;  Location: [facility];  Service: Cardiovascular;  Laterality: N/A;  . CARDIOVERSION N/A 06/04/2016   Procedure: CARDIOVERSION;  Surgeon: Skeet Latch, MD;  Location: [facility];  Service: Cardiovascular;  Laterality: N/A;  . CATARACT EXTRACTION W/ INTRAOCULAR LENS  IMPLANT, BILATERAL Bilateral 2000s  . CORONARY ANGIOPLASTY  early 41s  . CORONARY ANGIOPLASTY WITH STENT PLACEMENT     "I've got 15 stents" (07/19/2016)  . KNEE ARTHROSCOPY Left 1990s  . LEFT HEART CATH AND CORONARY ANGIOGRAPHY N/A 11/15/2017   Procedure: LEFT HEART CATH AND CORONARY ANGIOGRAPHY;  Surgeon: Jettie Booze, MD;  Location: [facility];  Service: Cardiovascular;  Laterality: N/A;  . NASAL SINUS SURGERY    . POSTERIOR LUMBAR FUSION  2015   L4-5-S1  . right knee surgery     age of 32  . TONSILLECTOMY AND ADENOIDECTOMY  1949        Home Medications    Prior to Admission medications   Medication  Sig Start Date End Date Taking? Authorizing Provider  ALPRAZolam Duanne Moron) 0.5 MG tablet Take 1 tablet (0.5 mg total) by mouth at bedtime as needed for anxiety or sleep. 06/02/18   Bonnielee Haff, MD  apixaban (ELIQUIS) 5 MG TABS tablet Take 1 tablet (5 mg total) by mouth 2 (two) times daily. 04/01/16   Nita Sells, MD  aspirin EC 81 MG EC tablet Take 1 tablet (81 mg total) daily by mouth. 11/18/17   Rama, Venetia Maxon, MD  carvedilol (COREG) 12.5 MG tablet Take 1 tablet (12.5 mg total) by mouth 2 (two) times daily with a meal. 04/24/18   Kilroy, Doreene Burke, PA-C  clopidogrel (PLAVIX) 75 MG tablet Take 1 tablet (75 mg total) by mouth daily. 11/28/17   Kilroy, Doreene Burke, PA-C  Evolocumab (REPATHA SURECLICK) 956 MG/ML SOAJ Inject 140 mg into the skin every 14 (fourteen) days. 04/25/17   Skeet Latch, MD  fenofibrate (TRICOR) 145 MG tablet Take 145 mg by mouth daily.    [provider]  furosemide (LASIX) 80  MG tablet Take 1 tablet (80 mg total) by mouth 2 (two) times daily. 10/30/17   Erlene Quan, PA-C  insulin glargine (LANTUS) 100 UNIT/ML injection Inject 0.18 mLs (18 Units total) into the skin daily. 06/02/18   Bonnielee Haff, MD  isosorbide mononitrate (IMDUR) 60 MG 24 hr tablet Take 1 tablet (60 mg total) daily by mouth. Patient taking differently: Take 30 mg by mouth daily.  11/18/17   Rama, Venetia Maxon, MD  leucovorin (WELLCOVORIN) 10 MG tablet Take 10 mg by mouth See admin instructions. Take 1 tablet (10 mg) by mouth every 12 hours and 24 hours after the methotrexate dose    [provider]  LUMIGAN 0.01 % SOLN Place 1 drop into both eyes at bedtime.  04/30/16   [provider]  Methotrexate Sodium (METHOTREXATE, PF,) 50 MG/2ML injection Inject 15 mg into the muscle every Friday.  03/09/16   [provider]  Misc Natural Products (OSTEO BI-FLEX ADV DOUBLE ST PO) Take 1 tablet by mouth daily as needed (takes occassionally).     [provider]    nitroGLYCERIN (NITROSTAT) 0.4 MG SL tablet PLACE 1 TABLET UNDER THE TONGUE IF NEEDED 05/14/18   Skeet Latch, MD  omega-3 acid ethyl esters (LOVAZA) 1 g capsule Take 1 capsule (1 g total) by mouth 2 (two) times daily. 12/26/17   Skeet Latch, MD  pantoprazole (PROTONIX) 40 MG tablet TAKE 1 TABLET(40 MG) BY MOUTH DAILY 05/08/18   Erlene Quan, PA-C  PARoxetine (PAXIL) 40 MG tablet Take 40 mg by mouth daily.  04/30/16   [provider]  potassium chloride SA (K-DUR,KLOR-CON) 20 MEQ tablet TAKE 2 TABLETS BY MOUTH TWICE DAILY 05/01/18   Skeet Latch, MD  predniSONE (DELTASONE) 5 MG tablet Take 5 mg by mouth daily with breakfast.    [provider]  ranolazine (RANEXA) 1000 MG SR tablet Take 1 tablet (1,000 mg total) by mouth 2 (two) times daily. 05/05/18   Skeet Latch, MD  tamsulosin (FLOMAX) 0.4 MG CAPS capsule Take 0.4 mg by mouth daily.  02/21/16   [provider]  tiZANidine (ZANAFLEX) 4 MG tablet Take 1 tablet by mouth every 8 (eight) hours as needed for muscle spasms.  05/29/16   [provider]  topiramate (TOPAMAX) 25 MG tablet Take 25 mg by mouth 2 (two) times daily.    [provider]  traMADol (ULTRAM) 50 MG tablet Take 1 tablet (50 mg total) by mouth 3 (three) times daily as needed (pain). Take as directed 06/02/18   Bonnielee Haff, MD  VITAMIN D, ERGOCALCIFEROL, PO Take 5,000 Units by mouth daily.     [provider]  ZETIA 10 MG tablet Take 10 mg by mouth daily.  07/08/16   [provider]    Family History Family History  Problem Relation Age of Onset  . Heart failure Mother   . Hyperlipidemia Mother   . Hypertension Mother   . Heart attack Father   . Heart attack Brother     Social History Social History   Tobacco Use  . Smoking status: Never Smoker  . Smokeless tobacco: Never Used  Substance Use Topics  . Alcohol use: No  . Drug use: No     Allergies   Statins; Ace inhibitors; Contrast media  [iodinated diagnostic agents]; and Penicillins   Review of Systems Review of Systems  Ten systems reviewed and are negative for acute change, except as noted in the HPI.     Physical  Exam Updated Vital Signs There were no vitals taken for this visit.  Physical Exam  Constitutional: He appears well-developed and well-nourished. No distress.  HENT:  Head: Normocephalic and atraumatic.  Eyes: Pupils are equal, round, and reactive to light. Conjunctivae and EOM are normal. No scleral icterus.  Neck: Normal range of motion. Neck supple.  Cardiovascular: Normal rate, regular rhythm and normal heart sounds.  Pulmonary/Chest: Effort normal. No respiratory distress. He has rales.  Abdominal: Soft. There is no tenderness.  Musculoskeletal: He exhibits no edema.  Neurological: He is alert.  Skin: Skin is warm and dry. He is not diaphoretic.  Psychiatric: His behavior is normal.  Nursing note and vitals reviewed.    ED Treatments / Results  Labs (all labs ordered are listed, but only abnormal results are displayed) Labs Reviewed - No data to display  EKG None  Radiology No results found.  Procedures .Critical Care Performed by: Margarita Mail, PA-C Authorized by: Margarita Mail, PA-C   Critical care provider statement:    Critical care time (minutes):  40   Critical care was necessary to treat or prevent imminent or life-threatening deterioration of the following conditions:  Endocrine crisis and metabolic crisis   Critical care was time spent personally by me on the following activities:  Ordering and performing treatments and interventions, ordering and review of laboratory studies, ordering and review of radiographic studies, re-evaluation of patient's condition, review of old charts, evaluation of patient's response to treatment, discussions with consultants, development of treatment plan with patient or surrogate, examination of patient and pulse oximetry   (including  critical care time)  Medications Ordered in ED Medications - No data to display   Initial Impression / Assessment and Plan / ED Course  I have reviewed the triage vital signs and the nursing notes.  Pertinent labs & imaging results that were available during my care of the patient were reviewed by me and considered in my medical decision making (see chart for details).  Clinical Course as of Jul 02 2337  Wed Jul 02, 2018  1854 Glucose-Capillary: 55 [AH]    Clinical Course User Index [AH] Margarita Mail, PA-C   Patient with elevated WBC count. His creatinine is at baseline. Patient with labile blood sugar requiring dextrose drip. I doubt underlying infection. Patient will be admitted to the hospitalist service.  Final Clinical Impressions(s) / ED Diagnoses   Final diagnoses:  None    ED Discharge Orders    None       Margarita Mail, PA-C 07/02/18 2350    Tegeler, Gwenyth Allegra, MD 07/03/18 [phone]

## 2018-07-02 NOTE — H&P (Signed)
[facility]                                                                                    Patient Demographics  Julian Ross, is a 72 y.o. male  CSN: [id number]  MRN: [id number]  DOB - Jan 29, 1946  Admit Date - 07/02/2018  Outpatient Primary MD for the patient is Reynold Bowen, MD   With History of -  Past Medical History:  Diagnosis Date  . Anemia   . Anxiety state   . CAD (coronary artery disease)    16 prior stents (at [facility])  . Cardiomyopathy, ischemic   . Chronic lower back pain   . Chronic systolic CHF (congestive heart failure) ([place])   . CKD (chronic kidney disease), stage III ([place])   . Depression   . Diabetes mellitus type 2 in obese ([place])   . Fibromyalgia   . Hepatitis A    at age 41  . High cholesterol   . History of blood transfusion    "related to OR"  . Hypertension   . Hypokalemia   . MI (myocardial infarction) ([place]) 1990; 1995; 1997  . OSA on CPAP    setting = 13, full face mask  . Osteoarthritis   . Persistent atrial fibrillation ([place]) 02/2016   on Eliquis  . Rheumatoid arthritis of multiple sites with negative rheumatoid factor ([place])   . Septic shock ([place]) 02/2016   in setting of severe pneumonia      Past Surgical History:  Procedure Laterality Date  . BACK SURGERY    . CARDIAC CATHETERIZATION N/A 10/03/2016   Procedure: Left Heart Cath and Coronary Angiography;  Surgeon: Belva Crome, MD;  Location: [facility];  Service: Cardiovascular;  Laterality: N/A;  . CARDIOVERSION N/A 06/04/2016   Procedure: CARDIOVERSION;  Surgeon: Skeet Latch, MD;  Location: [facility];  Service: Cardiovascular;  Laterality: N/A;  . CATARACT EXTRACTION W/ INTRAOCULAR LENS  IMPLANT, BILATERAL Bilateral 2000s  . CORONARY ANGIOPLASTY  early 53s  . CORONARY ANGIOPLASTY WITH STENT PLACEMENT     "I've got 15 stents" (07/19/2016)  . KNEE ARTHROSCOPY Left 1990s  . LEFT HEART CATH AND CORONARY ANGIOGRAPHY N/A 11/15/2017    Procedure: LEFT HEART CATH AND CORONARY ANGIOGRAPHY;  Surgeon: Jettie Booze, MD;  Location: [facility];  Service: Cardiovascular;  Laterality: N/A;  . NASAL SINUS SURGERY    . POSTERIOR LUMBAR FUSION  2015   L4-5-S1  . right knee surgery     age of 20  . TONSILLECTOMY AND ADENOIDECTOMY  1949    in for   Chief Complaint  Patient presents with  . 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 fib and chronic kidney disease, presenting today with hypoglycemic episodes on his regular regimen of NovoLog 50/50 .  Patient was resistant to treatment with IM glucagon and D50 water and he was started on D10 water at 50 mL/h and is feeling better at this time.  Mental status improved  but is slightly confused still    Review of Systems    In addition to the HPI above,  No Fever-chills, No Headache, No changes with Vision or hearing, No problems swallowing food or Liquids, No Chest pain, Cough or Shortness of Breath, No Abdominal pain, No Nausea or Vommitting, Bowel movements are regular, No Blood in stool or Urine, No dysuria, No new skin rashes or bruises, No new joints pains-aches,  No new weakness, tingling, numbness in any extremity, No recent weight gain or loss, No polyuria, polydypsia or polyphagia, No significant Mental Stressors.     Social History Social History   Tobacco Use  . Smoking status: Never Smoker  . Smokeless tobacco: Never Used  Substance Use Topics  . Alcohol use: No     Family History Family History  Problem Relation Age of Onset  . Heart failure Mother   . Hyperlipidemia Mother   . Hypertension Mother   . Heart attack Father   . Heart attack Brother      Prior to Admission medications   Medication Sig Start Date End Date Taking? Authorizing  Provider  ALPRAZolam Duanne Moron) 0.5 MG tablet Take 1 tablet (0.5 mg total) by mouth at bedtime as needed for anxiety or sleep. 06/02/18  Yes Bonnielee Haff, MD  apixaban (ELIQUIS) 5 MG TABS tablet Take 1 tablet (5 mg total) by mouth 2 (two) times daily. 04/01/16  Yes Nita Sells, MD  aspirin EC 81 MG EC tablet Take 1 tablet (81 mg total) daily by mouth. 11/18/17  Yes Rama, Venetia Maxon, MD  carvedilol (COREG) 12.5 MG tablet Take 1 tablet (12.5 mg total) by mouth 2 (two) times daily with a meal. 04/24/18  Yes Kilroy, Luke K, PA-C  clopidogrel (PLAVIX) 75 MG tablet Take 1 tablet (75 mg total) by mouth daily. 11/28/17  Yes Kilroy, Luke K, PA-C  Evolocumab (REPATHA SURECLICK) 381 MG/ML SOAJ Inject 140 mg into the skin every 14 (fourteen) days. 04/25/17  Yes Skeet Latch, MD  furosemide (LASIX) 80 MG tablet Take 1 tablet (80 mg total) by mouth 2 (two) times daily. 10/30/17  Yes Kilroy, Luke K, PA-C  fenofibrate (TRICOR) 145 MG tablet Take 145 mg by mouth daily.    [provider]  insulin glargine (LANTUS) 100 UNIT/ML injection Inject 0.18 mLs (18 Units total) into the skin daily. Patient not taking: Reported on 07/02/2018 06/02/18   Bonnielee Haff, MD  isosorbide mononitrate (IMDUR) 60 MG 24 hr tablet Take 1 tablet (60 mg total) daily by mouth. Patient taking differently: Take 30 mg by mouth daily.  11/18/17   Rama, Venetia Maxon, MD  leucovorin (WELLCOVORIN) 10 MG tablet Take 10 mg by mouth See admin instructions. Take 1 tablet (10 mg) by mouth every 12 hours and 24 hours after the methotrexate dose    [provider]  LUMIGAN 0.01 % SOLN Place 1 drop into both eyes at bedtime.  04/30/16   [provider]  Methotrexate Sodium (METHOTREXATE, PF,) 50 MG/2ML injection Inject 15 mg into the muscle every Friday.  03/09/16   [provider]  Misc Natural Products (OSTEO BI-FLEX ADV DOUBLE ST PO) Take 1 tablet by mouth daily as needed (takes occassionally).     [provider]  nitroGLYCERIN (NITROSTAT) 0.4 MG SL tablet PLACE 1 TABLET UNDER THE TONGUE IF NEEDED 05/14/18   Skeet Latch, MD  omega-3 acid ethyl esters (LOVAZA) 1 g capsule Take 1 capsule (1 g total) by mouth 2 (two) times daily. 12/26/17  Skeet Latch, MD  pantoprazole (PROTONIX) 40 MG tablet TAKE 1 TABLET(40 MG) BY MOUTH DAILY 05/08/18   Erlene Quan, PA-C  PARoxetine (PAXIL) 40 MG tablet Take 40 mg by mouth daily.  04/30/16   [provider]  potassium chloride SA (K-DUR,KLOR-CON) 20 MEQ tablet TAKE 2 TABLETS BY MOUTH TWICE DAILY 05/01/18   Skeet Latch, MD  predniSONE (DELTASONE) 5 MG tablet Take 5 mg by mouth daily with breakfast.    [provider]  ranolazine (RANEXA) 1000 MG SR tablet Take 1 tablet (1,000 mg total) by mouth 2 (two) times daily. 05/05/18   Skeet Latch, MD  tamsulosin (FLOMAX) 0.4 MG CAPS capsule Take 0.4 mg by mouth daily.  02/21/16   [provider]  tiZANidine (ZANAFLEX) 4 MG tablet Take 1 tablet by mouth every 8 (eight) hours as needed for muscle spasms.  05/29/16   [provider]  topiramate (TOPAMAX) 25 MG tablet Take 25 mg by mouth 2 (two) times daily.    [provider]  traMADol (ULTRAM) 50 MG tablet Take 1 tablet (50 mg total) by mouth 3 (three) times daily as needed (pain). Take as directed 06/02/18   Bonnielee Haff, MD  VITAMIN D, ERGOCALCIFEROL, PO Take 5,000 Units by mouth daily.     [provider]  ZETIA 10 MG tablet Take 10 mg by mouth daily.  07/08/16   [provider]    Allergies  Allergen Reactions  . Statins Other (See Comments)    Causes stiffness in joints   . Ace Inhibitors Cough  . Contrast Media [Iodinated Diagnostic Agents] Rash    Has to take benadryl prior to use  . Penicillins Hives and Rash    Has patient had a PCN reaction causing immediate rash, facial/tongue/throat swelling, SOB or lightheadedness with hypotension: Yes Has patient had a PCN reaction causing  severe rash involving mucus membranes or skin necrosis: No Has patient had a PCN reaction that required hospitalization pt was in the hospital at time of last reaction - heart attack Has patient had a PCN reaction occurring within the last 10 years: No If all of the above answers are "NO", then may proceed with Cephalosporin use.    Physical Exam  Vitals  Blood pressure (!) 159/92, pulse 84, resp. rate 19, height 5\' 11"  (1.803 m), weight 90.7 kg (200 lb), SpO2 98 %.   1. General well-developed, well-nourished male, in no acute distress  2. Normal affect and insight, pleasantly confused.  3. No F.N deficits, grossly, patient moving all extremities.  4. Ears and Eyes appear Normal, Conjunctivae clear, PERRLA. Moist Oral Mucosa.  5. Supple Neck, No JVD, No cervical lymphadenopathy appriciated, No Carotid Bruits.  6. Symmetrical Chest wall movement, Good air movement bilaterally, CTAB.  7.  Irregularly irregular, No Gallops, Rubs or Murmurs, No Parasternal Heave.  8. Positive Bowel Sounds, Abdomen Soft, Non tender, No organomegaly appriciated,No rebound -guarding or rigidity.  9.  No Cyanosis, Normal Skin Turgor, No Skin Rash or Bruise.  10. Good muscle tone,  joints appear normal , no effusions, Normal ROM.    Data Review  CBC Recent Labs  Lab 07/02/18 1826 07/02/18 2006  WBC  --  13.8*  HGB 12.2* 10.8*  HCT 36.0* 33.1*  PLT  --  300  MCV  --  93.2  MCH  --  30.4  MCHC  --  32.6  RDW  --  14.5   ------------------------------------------------------------------------------------------------------------------  Chemistries  Recent Labs  Lab 07/02/18  1819 07/02/18 1826  NA 137 135  K 3.9 3.9  CL 99 101  CO2 27  --   GLUCOSE 32* 35*  BUN 17 20  CREATININE 1.95* 1.80*  CALCIUM 9.3  --   AST 34  --   ALT 16  --   ALKPHOS 42  --   BILITOT 0.8  --     ------------------------------------------------------------------------------------------------------------------ estimated creatinine clearance is 43.4 mL/min (A) (by C-G formula based on SCr of 1.8 mg/dL (H)). ------------------------------------------------------------------------------------------------------------------ No results for input(s): TSH, T4TOTAL, T3FREE, THYROIDAB in the last 72 hours.  Invalid input(s): FREET3   Coagulation profile No results for input(s): INR, PROTIME in the last 168 hours. ------------------------------------------------------------------------------------------------------------------- No results for input(s): DDIMER in the last 72 hours. -------------------------------------------------------------------------------------------------------------------  Cardiac Enzymes No results for input(s): CKMB, TROPONINI, MYOGLOBIN in the last 168 hours.  Invalid input(s): CK ------------------------------------------------------------------------------------------------------------------ Invalid input(s): POCBNP   ---------------------------------------------------------------------------------------------------------------  Urinalysis    Component Value Date/Time   COLORURINE STRAW (A) 07/02/2018 1819   APPEARANCEUR CLEAR 07/02/2018 1819   LABSPEC 1.004 (L) 07/02/2018 1819   PHURINE 7.0 07/02/2018 1819   GLUCOSEU NEGATIVE 07/02/2018 1819   HGBUR NEGATIVE 07/02/2018 1819   BILIRUBINUR NEGATIVE 07/02/2018 1819   KETONESUR NEGATIVE 07/02/2018 1819   PROTEINUR NEGATIVE 07/02/2018 1819   NITRITE NEGATIVE 07/02/2018 1819   LEUKOCYTESUR NEGATIVE 07/02/2018 1819    ----------------------------------------------------------------------------------------------------------------     Imaging results:   Dg Chest Port 1 View  Result Date: 07/02/2018 CLINICAL DATA:  Rhonchi EXAM: PORTABLE CHEST 1 VIEW COMPARISON:  05/28/2018, 11/13/2017 FINDINGS: Low  lung volumes with chronic elevation of right diaphragm. Cardiomegaly without significant effusion. Chronic appearing left greater than right reticular changes/suspected chronic interstitial disease. Aortic atherosclerosis. No pneumothorax. IMPRESSION: No active disease. Chronic cardiomegaly. 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 Electronically Signed   By: Donavan Foil M.D.   On: 07/02/2018 18:30    My personal review of EKG: A. fib 78 bpm, PVCs with nonspecific interventricular conduction delay    Assessment & Plan  1.  Hypoglycemia, patient is on long-acting Novolin, persistent 2.  History of coronary artery disease, congestive heart failure  Plan  Continue D10 water Insulin sliding scale Monitor blood sugar   DVT Prophylaxis Lovenox  AM Labs Ordered, also please review Full Orders    Code Status full  Disposition Plan: Home  Time spent in minutes : 38 minutes  Condition GUARDED   @SIGNATURE @

## 2018-07-02 NOTE — ED Triage Notes (Signed)
Patient arrived via ems, from home, per ems AMS with a cbg of 18, now cbg 71 pta. Also per ems pt has hx of TIA with no deficits, a fib, 16 cardiac stents and dm. 0/10 pain at this time  EMS tx: Glucagon IM 25g D10 IV

## 2018-07-03 DIAGNOSIS — N182 Chronic kidney disease, stage 2 (mild): Secondary | ICD-10-CM

## 2018-07-03 DIAGNOSIS — I10 Essential (primary) hypertension: Secondary | ICD-10-CM

## 2018-07-03 DIAGNOSIS — E162 Hypoglycemia, unspecified: Secondary | ICD-10-CM

## 2018-07-03 DIAGNOSIS — I5042 Chronic combined systolic (congestive) and diastolic (congestive) heart failure: Secondary | ICD-10-CM

## 2018-07-03 DIAGNOSIS — E11649 Type 2 diabetes mellitus with hypoglycemia without coma: Secondary | ICD-10-CM | POA: Diagnosis not present

## 2018-07-03 DIAGNOSIS — D631 Anemia in chronic kidney disease: Secondary | ICD-10-CM

## 2018-07-03 LAB — BASIC METABOLIC PANEL
Anion gap: 4 — ABNORMAL LOW (ref 5–15)
BUN: 15 mg/dL (ref 8–23)
CHLORIDE: 105 mmol/L (ref 98–111)
CO2: 26 mmol/L (ref 22–32)
CREATININE: 1.8 mg/dL — AB (ref 0.61–1.24)
Calcium: 8.7 mg/dL — ABNORMAL LOW (ref 8.9–10.3)
GFR calc Af Amer: 42 mL/min — ABNORMAL LOW (ref >60)
GFR, EST NON AFRICAN AMERICAN: 36 mL/min — AB (ref >60)
Glucose, Bld: 102 mg/dL — ABNORMAL HIGH (ref 70–99)
Potassium: 3.7 mmol/L (ref 3.5–5.1)
SODIUM: 135 mmol/L (ref 135–145)

## 2018-07-03 LAB — GLUCOSE, CAPILLARY
GLUCOSE-CAPILLARY: 140 mg/dL — AB (ref 70–99)
GLUCOSE-CAPILLARY: 91 mg/dL (ref 70–99)
GLUCOSE-CAPILLARY: 208 mg/dL — AB (ref 70–99)
GLUCOSE-CAPILLARY: 207 mg/dL — AB (ref 70–99)
Glucose-Capillary: 178 mg/dL — ABNORMAL HIGH (ref 70–99)
Glucose-Capillary: 197 mg/dL — ABNORMAL HIGH (ref 70–99)

## 2018-07-03 LAB — MRSA PCR SCREENING: MRSA BY PCR: POSITIVE — AB

## 2018-07-03 LAB — HEMOGLOBIN A1C
Hgb A1c MFr Bld: 7.9 % — ABNORMAL HIGH (ref 4.8–5.6)
MEAN PLASMA GLUCOSE: 180.03 mg/dL

## 2018-07-03 MED ORDER — INSULIN ASPART 100 UNIT/ML ~~LOC~~ SOLN: 0.0000 [IU] | Freq: Three times a day (TID) | SUBCUTANEOUS | Status: DC

## 2018-07-03 MED ORDER — INSULIN GLARGINE 100 UNIT/ML ~~LOC~~ SOLN
15.0000 [IU] | Freq: Every day | SUBCUTANEOUS | Status: DC
  Administered 2018-07-03: 15 [IU] via SUBCUTANEOUS
  Filled 2018-07-03 (×2): qty 0.15

## 2018-07-03 MED ORDER — INSULIN ASPART 100 UNIT/ML ~~LOC~~ SOLN
0.0000 [IU] | Freq: Three times a day (TID) | SUBCUTANEOUS | Status: DC
  Administered 2018-07-03: 2 [IU] via SUBCUTANEOUS
  Administered 2018-07-03: 3 [IU] via SUBCUTANEOUS
  Administered 2018-07-04: 1 [IU] via SUBCUTANEOUS
  Administered 2018-07-04: 2 [IU] via SUBCUTANEOUS

## 2018-07-03 MED ORDER — MUPIROCIN 2 % EX OINT
1.0000 "application " | TOPICAL_OINTMENT | Freq: Two times a day (BID) | CUTANEOUS | Status: DC
  Administered 2018-07-03 – 2018-07-04 (×3): 1 via NASAL
  Filled 2018-07-03: qty 22

## 2018-07-03 MED ORDER — CHLORHEXIDINE GLUCONATE CLOTH 2 % EX PADS
6.0000 | MEDICATED_PAD | Freq: Every day | CUTANEOUS | Status: DC
  Administered 2018-07-04: 6 via TOPICAL

## 2018-07-03 NOTE — Progress Notes (Signed)
Inpatient Diabetes Program Recommendations  AACE/ADA: New Consensus Statement on Inpatient Glycemic Control (2015)  Target Ranges:  Prepandial:   less than 140 mg/dL      Peak postprandial:   less than 180 mg/dL (1-2 hours)      Critically ill patients:  140 - 180 mg/dL   Lab Results  Component Value Date   GLUCAP 91 07/03/2018   HGBA1C 7.9 (H) 07/03/2018    Review of Glycemic Control Results for Julian Ross, Julian Ross (MRN [id number]) as of 07/03/2018 10:27  Ref. Range 07/02/2018 21:43 07/02/2018 22:37 07/02/2018 23:42 07/03/2018 01:18 07/03/2018 08:15  Glucose-Capillary Latest Ref Range: 70 - 99 mg/dL 90 68 (L) 97 140 (H) 91   Diabetes history: Type 2 DM Outpatient Diabetes medications: Humalog 50/50 50 units BID Current orders for Inpatient glycemic control: Novolog 0-5 units QHS, Prednisone 5 mg QAM  Inpatient Diabetes Program Recommendations:    Patient came in with severe hypoglycemia. Remember patent from previous hospitalization (inpatient insulin regimen on 06/02/18 was Lantus 18 units QD, Novolog 0-15 units Q4H). Spoke with patient today regarding episode. Patient states, " Since I saw you last, I have spent 6 weeks in the rehab; they had me on Lantus and blood sugars were doing okay. When I went back home, I just resumed the Humalog 50/50 like I was doing before. I think it drops me to low." Patient reporting self injections and checking bloods sugars 2-3 times per day and reported multiple lows. More coherent today than compared to last visit.   At this time would recommend discharging patient home on Lantus pens. Patient feels comfortable with this plan. Does NOT want Humalog 50/50 at discharge. He states, "I am scared to take more of the Humalog again." I am assuming he reverted back because of comfortability. Supposed to follow up with Dr Forde Dandy and CDE regarding Dexcom and for for further insulin adjustments.    Thanks, Bronson Curb, MSN, RNC-OB Diabetes Coordinator [phone]  (8a-5p)

## 2018-07-03 NOTE — Progress Notes (Signed)
[facility]                                                                              Patient Demographics  Julian Ross, is a 72 y.o. male, DOB - 1946-02-11, XLK:[id number]  Admit date - 07/02/2018   Admitting Physician Merton Border, MD  Outpatient Primary MD for the patient is Reynold Bowen, MD  Outpatient specialists:   LOS - 0  days   Medical records reviewed and are as summarized below:    Chief Complaint  Patient presents with  . 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 fib and chronic kidney disease, presented with hypoglycemic episodes at home with CBGs in 51s with his home regimen of Humalog 50/50 .  Patient was resistant to treatment with IM glucagon and D50 water and he was started on D10 water at 50 mL/h.  Patient was admitted for further work-up.   Assessment & Plan    Principal Problem: Acute hypoglycemia: Likely due to excessive Humalog insulin, in the setting of diabetes mellitus type 2, insulin-dependent, uncontrolled -Denies any recent illness, no fevers or chills, GI or respiratory symptoms - Patient was recently discharged to SNF rehab on 06/02/2018 on Lantus 18 units daily with sliding scale insulin.  Patient reports that he did well on Lantus insulin however when he returned back home a week ago, he resumed Humalog 50/50 (he just had "too much insulin sitting at home").  -Discussed in detail with the patient he is open to staying on Lantus and sliding scale insulin and does not want Humalog again. -At this time blood sugars are stable and patient is alert and oriented.  Will stop D10 drip and observe patient off the dextrose.  Resume carb modified diet and sliding scale insulin only for now. -If patient is able to maintain CBGs and are trending up,  will start on long-acting insulin -Hemoglobin A1c 7.9, follows endocrinology, Dr. Forde Dandy  Active Problems:   Chronic combined systolic and diastolic CHF (congestive heart failure) (HCC) - Currently stable and compensated, DC D10 drip -Continue Coreg, Lasix  Mild acute on CKD (chronic kidney disease) stage 3, GFR 30-59 ml/min ([facility]) -Baseline 1.5-1.6 -Admitted with a creatinine of 1.9, improving to 1.8 today -Hold Lasix today, will restart tomorrow    Anemia of chronic kidney failure -Baseline hemoglobin around 11.  Hemoglobin currently 10.8, stable    Benign essential HTN -BP improving    Coronary artery disease Currently stable, no chest pain or shortness of breath. -Continue aspirin, Plavix, Coreg, Imdur, Ranexa  Chronic atrial fibrillation -Continue Coreg for rate control,  - [place] VASC 5, continue Eliquis  Code Status: DNR DVT Prophylaxis apixaban Family Communication: Discussed in detail with the patient, all imaging results, lab results explained to the patient   Disposition Plan: Possible DC home tomorrow  Time Spent in minutes   35 minutes  Procedures:  None  Consultants:   None  Antimicrobials:      Medications  Scheduled Meds: . apixaban  5 mg Oral BID  . aspirin EC  81 mg Oral Daily  . carvedilol  12.5 mg Oral BID WC  . cholecalciferol  5,000 Units Oral Daily  . clopidogrel  75 mg Oral Daily  . [START ON 07/09/2018] Evolocumab  140 mg Subcutaneous Q14 Days  . ezetimibe  10 mg Oral Daily  . fenofibrate  160 mg Oral Daily  . furosemide  80 mg Oral BID  . insulin aspart  0-5 Units Subcutaneous QHS  . insulin aspart  0-9 Units Subcutaneous TID WC  . isosorbide mononitrate  60 mg Oral Daily  . latanoprost  1 drop Both Eyes QHS  . omega-3 acid ethyl esters  1 capsule Oral BID  . pantoprazole  40 mg Oral Daily  . PARoxetine  40 mg Oral Daily  . potassium chloride SA  40 mEq Oral BID  . predniSONE  5 mg Oral Q breakfast  . ranolazine  1,000 mg Oral  BID  . sodium chloride flush  3 mL Intravenous Q12H  . tamsulosin  0.4 mg Oral Daily  . topiramate  25 mg Oral BID   Continuous Infusions: . sodium chloride     PRN Meds:.sodium chloride, ALPRAZolam, nitroGLYCERIN, ondansetron **OR** ondansetron (ZOFRAN) IV, sodium chloride flush, tiZANidine, traMADol   Antibiotics   Anti-infectives (From admission, onward)   None        Subjective:   Julian Ross was seen and examined today.  Feels better today, CBGs stable on D10 drip.  No hypoglycemia episode this morning.  Patient denies dizziness, chest pain, shortness of breath, abdominal pain, N/V/D/C, new weakness, numbess, tingling. No acute events overnight.    Objective:   Vitals:   07/02/18 2200 07/02/18 2230 07/03/18 0625 07/03/18 0949  BP:  (!) 178/104 134/74 122/78  Pulse: 85 93 65 88  Resp:  20 18   Temp:  (!) 97.5 F (36.4 C) 99.3 F (37.4 C)   TempSrc:  Oral Oral   SpO2: 99% 99% 99%   Weight:      Height:  5\' 11"  (1.803 m)      Intake/Output Summary (Last 24 hours) at 07/03/2018 1207 Last data filed at 07/03/2018 1159 Gross per 24 hour  Intake 1133.5 ml  Output 2570 ml  Net -1436.5 ml     Wt Readings from Last 3 Encounters:  07/02/18 90.7 kg (200 lb)  05/28/18 91.6 kg (202 lb)  05/16/18 91.7 kg (202 lb 3.2 oz)     Exam  General: Alert and oriented x 3, NAD  Eyes:   HEENT:  Atraumatic, normocephalic  Cardiovascular: S1 S2 auscultated,Regular rate and rhythm.  Respiratory: Clear to auscultation bilaterally, no wheezing, rales or rhonchi  Gastrointestinal: Soft, nontender, nondistended, + bowel sounds  Ext: no pedal edema bilaterally  Neuro: no new deficits  Musculoskeletal: No digital cyanosis, clubbing  Skin: No rashes  Psych: Normal affect and demeanor, alert and oriented x3    Data Reviewed:  I have personally reviewed following labs and imaging studies  Micro Results Recent Results (from the past 240 hour(s))  MRSA PCR Screening      Status: Abnormal   Collection Time: 07/03/18  5:36 AM  Result Value Ref Range Status   MRSA by PCR POSITIVE (A) NEGATIVE Final    Comment:        The GeneXpert MRSA Assay (FDA approved for NASAL specimens only), is one component of a comprehensive MRSA colonization surveillance program. It is not intended to  diagnose MRSA infection nor to guide or monitor treatment for MRSA infections. RESULT CALLED TO, READ BACK BY AND VERIFIED WITH: Phillips Climes RN 9:30 07/03/18 ([email or web address]) Performed at [facility], [place] [address], [place], [place] [address]     Radiology Reports Dg Chest Port 1 View  Result Date: 07/02/2018 CLINICAL DATA:  Rhonchi EXAM: PORTABLE CHEST 1 VIEW COMPARISON:  05/28/2018, 11/13/2017 FINDINGS: Low lung volumes with chronic elevation of right diaphragm. Cardiomegaly without significant effusion. Chronic appearing left greater than right reticular changes/suspected chronic interstitial disease. Aortic atherosclerosis. No pneumothorax. IMPRESSION: No active disease. Chronic cardiomegaly. 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 Electronically Signed   By: Donavan Foil M.D.   On: 07/02/2018 18:30    Lab Data:  CBC: Recent Labs  Lab 07/02/18 1826 07/02/18 2006  WBC  --  13.8*  HGB 12.2* 10.8*  HCT 36.0* 33.1*  MCV  --  93.2  PLT  --  213   Basic Metabolic Panel: Recent Labs  Lab 07/02/18 1819 07/02/18 1826 07/03/18 0436  NA 137 135 135  K 3.9 3.9 3.7  CL 99 101 105  CO2 27  --  26  GLUCOSE 32* 35* 102*  BUN 17 20 15   CREATININE 1.95* 1.80* 1.80*  CALCIUM 9.3  --  8.7*   GFR: Estimated Creatinine Clearance: 43.4 mL/min (A) (by C-G formula based on SCr of 1.8 mg/dL (H)). Liver Function Tests: Recent Labs  Lab 07/02/18 1819  AST 34  ALT 16  ALKPHOS 42  BILITOT 0.8  PROT 7.7  ALBUMIN 3.4*   No results for input(s): LIPASE, AMYLASE in the last 168 hours. No results for input(s): AMMONIA in the last 168 hours. Coagulation  Profile: No results for input(s): INR, PROTIME in the last 168 hours. Cardiac Enzymes: No results for input(s): CKTOTAL, CKMB, CKMBINDEX, TROPONINI in the last 168 hours. BNP (last 3 results) Recent Labs    05/05/18 0939  PROBNP 622*   HbA1C: Recent Labs    07/03/18 0700  HGBA1C 7.9*   CBG: Recent Labs  Lab 07/02/18 2143 07/02/18 2237 07/02/18 2342 07/03/18 0118 07/03/18 0815  GLUCAP 90 68* 97 140* 91   Lipid Profile: No results for input(s): CHOL, HDL, LDLCALC, TRIG, CHOLHDL, LDLDIRECT in the last 72 hours. Thyroid Function Tests: No results for input(s): TSH, T4TOTAL, FREET4, T3FREE, THYROIDAB in the last 72 hours. Anemia Panel: No results for input(s): VITAMINB12, FOLATE, FERRITIN, TIBC, IRON, RETICCTPCT in the last 72 hours. Urine analysis:    Component Value Date/Time   COLORURINE STRAW (A) 07/02/2018 1819   APPEARANCEUR CLEAR 07/02/2018 1819   LABSPEC 1.004 (L) 07/02/2018 1819   PHURINE 7.0 07/02/2018 1819   GLUCOSEU NEGATIVE 07/02/2018 1819   HGBUR NEGATIVE 07/02/2018 1819   BILIRUBINUR NEGATIVE 07/02/2018 1819   KETONESUR NEGATIVE 07/02/2018 1819   PROTEINUR NEGATIVE 07/02/2018 1819   NITRITE NEGATIVE 07/02/2018 1819   LEUKOCYTESUR NEGATIVE 07/02/2018 1819     Gabbi Whetstone M.D. [facility] 07/03/2018, 12:07 PM  Pager: [phone] Between 7am to 7pm - call Pager - [phone]  After 7pm go to [email or web address] - password [email or web address]  Call night coverage person covering after 7pm

## 2018-07-03 NOTE — Progress Notes (Signed)
Julian Ross, Julian Ross 69 y old male  Patient received from ED. Patient is alert and oriented x 4. Skin assessment done with another nurse. Iv in place and running fluid. Patient is on telemetry. Patient given instructions about call bell and phone. Bed in low position and side rail up x3. Call bell in reach.

## 2018-07-04 DIAGNOSIS — I5042 Chronic combined systolic (congestive) and diastolic (congestive) heart failure: Secondary | ICD-10-CM | POA: Diagnosis not present

## 2018-07-04 DIAGNOSIS — I10 Essential (primary) hypertension: Secondary | ICD-10-CM | POA: Diagnosis not present

## 2018-07-04 DIAGNOSIS — N183 Chronic kidney disease, stage 3 (moderate): Secondary | ICD-10-CM | POA: Diagnosis not present

## 2018-07-04 DIAGNOSIS — E162 Hypoglycemia, unspecified: Secondary | ICD-10-CM | POA: Diagnosis not present

## 2018-07-04 DIAGNOSIS — E11649 Type 2 diabetes mellitus with hypoglycemia without coma: Secondary | ICD-10-CM | POA: Diagnosis not present

## 2018-07-04 LAB — GLUCOSE, CAPILLARY
Glucose-Capillary: 96 mg/dL (ref 70–99)
Glucose-Capillary: 129 mg/dL — ABNORMAL HIGH (ref 70–99)
Glucose-Capillary: 179 mg/dL — ABNORMAL HIGH (ref 70–99)

## 2018-07-04 LAB — BASIC METABOLIC PANEL
Anion gap: 11 (ref 5–15)
BUN: 18 mg/dL (ref 8–23)
CHLORIDE: 102 mmol/L (ref 98–111)
CO2: 26 mmol/L (ref 22–32)
Calcium: 9.5 mg/dL (ref 8.9–10.3)
Creatinine, Ser: 2.01 mg/dL — ABNORMAL HIGH (ref 0.61–1.24)
GFR calc Af Amer: 37 mL/min — ABNORMAL LOW (ref >60)
GFR calc non Af Amer: 32 mL/min — ABNORMAL LOW (ref >60)
Glucose, Bld: 136 mg/dL — ABNORMAL HIGH (ref 70–99)
POTASSIUM: 4.3 mmol/L (ref 3.5–5.1)
SODIUM: 139 mmol/L (ref 135–145)

## 2018-07-04 LAB — CBC
HEMATOCRIT: 34.2 % — AB (ref 39.0–52.0)
HEMOGLOBIN: 11 g/dL — AB (ref 13.0–17.0)
MCH: 29.8 pg (ref 26.0–34.0)
MCHC: 32.2 g/dL (ref 30.0–36.0)
MCV: 92.7 fL (ref 78.0–100.0)
Platelets: 331 10*3/uL (ref 150–400)
RBC: 3.69 MIL/uL — AB (ref 4.22–5.81)
RDW: 14.7 % (ref 11.5–15.5)
WBC: 9 10*3/uL (ref 4.0–10.5)

## 2018-07-04 MED ORDER — INSULIN ASPART 100 UNIT/ML ~~LOC~~ SOLN: 0.0000 [IU] | Freq: Three times a day (TID) | SUBCUTANEOUS | 3 refills | Status: DC

## 2018-07-04 MED ORDER — INSULIN GLARGINE 100 UNIT/ML ~~LOC~~ SOLN
18.0000 [IU] | Freq: Every day | SUBCUTANEOUS | Status: DC
  Administered 2018-07-04: 18 [IU] via SUBCUTANEOUS
  Filled 2018-07-04: qty 0.18

## 2018-07-04 MED ORDER — INSULIN GLARGINE 100 UNIT/ML ~~LOC~~ SOLN: 18.0000 [IU] | Freq: Every day | SUBCUTANEOUS | 11 refills | Status: DC

## 2018-07-04 NOTE — Plan of Care (Signed)
  Problem: Education: Goal: Knowledge of General Education information will improve Outcome: Progressing Note:  POC reviewed with pt.   

## 2018-07-04 NOTE — Discharge Summary (Signed)
Physician Discharge Summary   Patient ID: Julian Ross MRN: [id number] DOB/AGE: December 07, 1946 72 y.o.  Admit date: 07/02/2018 Discharge date: 07/04/2018  Primary Care Physician:  Julian Bowen, MD   Recommendations for Outpatient Follow-up:  1. Follow up with PCP in 1-2 weeks 2. Humalog 50/50 insulin regimen discontinued  3. New medication:  Started on Lantus 18 units at bedtime daily Started on NovoLog sliding scale insulin 4. Hold Lasix until follow-up with PCP, please check BMET at the appointment  Home Health: None Equipment/Devices:   Discharge Condition: stable  CODE STATUS: FULL    Diet recommendation: Carb modified diet   Discharge Diagnoses:    . Hypoglycemia Type 2 diabetes mellitus, insulin-dependent, uncontrolled with hypoglycemia . Anemia of chronic kidney failure . Benign essential HTN . Chronic combined systolic and diastolic CHF (congestive heart failure) ([place]) . CKD (chronic kidney disease) stage 3, GFR 30-59 ml/min (HCC) . Coronary artery disease   Consults: None    Allergies:   Allergies  Allergen Reactions  . Statins Other (See Comments)    Causes stiffness in joints   . Ace Inhibitors Cough  . Contrast Media [Iodinated Diagnostic Agents] Rash    Has to take benadryl prior to use  . Penicillins Hives and Rash    Has patient had a PCN reaction causing immediate rash, facial/tongue/throat swelling, SOB or lightheadedness with hypotension: Yes Has patient had a PCN reaction causing severe rash involving mucus membranes or skin necrosis: No Has patient had a PCN reaction that required hospitalization pt was in the hospital at time of last reaction - heart attack Has patient had a PCN reaction occurring within the last 10 years: No If all of the above answers are "NO", then may proceed with Cephalosporin use.     DISCHARGE MEDICATIONS: Allergies as of 07/04/2018      Reactions   Statins Other (See Comments)   Causes stiffness in joints    Ace  Inhibitors Cough   Contrast Media [iodinated Diagnostic Agents] Rash   Has to take benadryl prior to use   Penicillins Hives, Rash   Has patient had a PCN reaction causing immediate rash, facial/tongue/throat swelling, SOB or lightheadedness with hypotension: Yes Has patient had a PCN reaction causing severe rash involving mucus membranes or skin necrosis: No Has patient had a PCN reaction that required hospitalization pt was in the hospital at time of last reaction - heart attack Has patient had a PCN reaction occurring within the last 10 years: No If all of the above answers are "NO", then may proceed with Cephalosporin use.      Medication List    STOP taking these medications   furosemide 80 MG tablet Commonly known as:  LASIX   insulin lispro protamine-lispro (50-50) 100 UNIT/ML Susp injection Commonly known as:  HUMALOG 50/50 MIX   potassium chloride SA 20 MEQ tablet Commonly known as:  K-DUR,KLOR-CON     TAKE these medications   ALPRAZolam 0.5 MG tablet Commonly known as:  XANAX Take 1 tablet (0.5 mg total) by mouth at bedtime as needed for anxiety or sleep.   apixaban 5 MG Tabs tablet Commonly known as:  ELIQUIS Take 1 tablet (5 mg total) by mouth 2 (two) times daily.   aspirin 81 MG EC tablet Take 1 tablet (81 mg total) daily by mouth.   carvedilol 12.5 MG tablet Commonly known as:  COREG Take 1 tablet (12.5 mg total) by mouth 2 (two) times daily with a meal.   clopidogrel 75  MG tablet Commonly known as:  PLAVIX Take 1 tablet (75 mg total) by mouth daily.   Evolocumab 140 MG/ML Soaj Commonly known as:  REPATHA SURECLICK Inject 062 mg into the skin every 14 (fourteen) days.   fenofibrate 145 MG tablet Commonly known as:  TRICOR Take 145 mg by mouth daily.   insulin aspart 100 UNIT/ML injection Commonly known as:  NOVOLOG Inject 0-9 Units into the skin 3 (three) times daily with meals. Sliding scale CBG 70 - 120: 0 units CBG 121 - 150: 1 unit,  CBG 151 -  200: 2 units,  CBG 201 - 250: 3 units,  CBG 251 - 300: 5 units,  CBG 301 - 350: 7 units,  CBG 351 - 400: 9 units   CBG > 400: 9 units and call your doctor's office   insulin glargine 100 UNIT/ML injection Commonly known as:  LANTUS Inject 0.18 mLs (18 Units total) into the skin at bedtime. Start taking on:  07/05/2018 What changed:  when to take this   isosorbide mononitrate 60 MG 24 hr tablet Commonly known as:  IMDUR Take 1 tablet (60 mg total) daily by mouth.   LUMIGAN 0.01 % Soln Generic drug:  bimatoprost Place 1 drop into both eyes at bedtime.   nitroGLYCERIN 0.4 MG SL tablet Commonly known as:  NITROSTAT PLACE 1 TABLET UNDER THE TONGUE IF NEEDED   omega-3 acid ethyl esters 1 g capsule Commonly known as:  LOVAZA Take 1 capsule (1 g total) by mouth 2 (two) times daily.   OSTEO BI-FLEX ADV DOUBLE ST PO Take 1 tablet by mouth daily as needed (takes occassionally).   pantoprazole 40 MG tablet Commonly known as:  PROTONIX TAKE 1 TABLET(40 MG) BY MOUTH DAILY   PARoxetine 40 MG tablet Commonly known as:  PAXIL Take 40 mg by mouth daily.   predniSONE 5 MG tablet Commonly known as:  DELTASONE Take 5 mg by mouth daily with breakfast.   ranolazine 1000 MG SR tablet Commonly known as:  RANEXA Take 1 tablet (1,000 mg total) by mouth 2 (two) times daily.   tamsulosin 0.4 MG Caps capsule Commonly known as:  FLOMAX Take 0.4 mg by mouth daily.   tiZANidine 4 MG tablet Commonly known as:  ZANAFLEX Take 1 tablet by mouth every 8 (eight) hours as needed for muscle spasms.   topiramate 25 MG tablet Commonly known as:  TOPAMAX Take 25 mg by mouth 2 (two) times daily.   traMADol 50 MG tablet Commonly known as:  ULTRAM Take 1 tablet (50 mg total) by mouth 3 (three) times daily as needed (pain). Take as directed   VITAMIN D (ERGOCALCIFEROL) PO Take 5,000 Units by mouth daily.   ZETIA 10 MG tablet Generic drug:  ezetimibe Take 10 mg by mouth daily.        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 fib and chronic kidney disease, presented with hypoglycemic episodes at home with CBGs in 60s with his home regimen of Humalog 50/50 .Patient was resistant to treatment with IM glucagon and D50 water and he was started on D10 water at 50 mL/h.  Patient was admitted for further work-up.    Hospital Course:  Acute hypoglycemia: Likely due to excessive Humalog insulin, in the setting of diabetes mellitus type 2, insulin-dependent, uncontrolled -Denied  any recent illness, no fevers or chills, GI or respiratory symptoms - Patient was recently discharged to SNF rehab on 06/02/2018 on Lantus 18 units daily with sliding scale insulin.  Patient reports that he did well on Lantus insulin however when he returned back home a week ago, he resumed Humalog 50/50 (he just had "too much insulin sitting at home").  -Discussed in detail with the patient he is open to staying on Lantus and sliding scale insulin and does not want Humalog again. -At this time blood sugars are stable and patient is alert and oriented.    Patient was initially placed on D10 drip.  Once blood sugars were stable, D10 drip was stopped and CBGs were observed off the dextrose drip.   - Patient was restarted on Lantus 18 units, with sliding scale sensitive -Hemoglobin A1c 7.9, follows endocrinology, Dr. Forde Dandy.  Patient recommended to maintain a log of his CBGs at home and bring it to Dr. Forde Dandy for further adjustment of insulin regimen    Chronic combined systolic and diastolic CHF (congestive heart failure) ([place]) - Currently stable and compensated, -Negative balance of 4.69 L, creatinine had trended up to 2.0.  Hence Lasix placed on hold until follow-up appointment with his  PCP.  Mild acute on CKD (chronic kidney disease) stage 3, GFR 30-59 ml/min (HCC) -Baseline 1.5-1.6 -Admitted with a creatinine of 1.9,  -Lasix placed on hold until follow-up with his PCP, recheck BMET on appointment    Anemia of chronic kidney failure -Baseline hemoglobin around 11.  Hemoglobin 11.0 at the time of discharge.    Benign essential HTN -BP stable, continue Coreg, Imdur, hold Lasix for now    Coronary artery disease Currently stable, no chest pain or shortness of breath. -Continue aspirin, Plavix, Coreg, Imdur, Ranexa  Chronic atrial fibrillation -Continue Coreg for rate control,  - [place] VASC 5, continue Eliquis    Day of Discharge S: Feeling better, no acute issues overnight, CBGs much more stable now, eager to go home.   BP 128/81 (BP Location: Right Arm)   Pulse 69   Temp 99 F (37.2 C) (Oral)   Resp 18   Ht 5\' 11"  (1.803 m)   Wt 90.7 kg (200 lb)   SpO2 99%   BMI 27.89 kg/m   Physical Exam: General: Alert and awake oriented x3 not in any acute distress. HEENT: anicteric sclera, pupils reactive to light and accommodation CVS: S1-S2 clear no murmur rubs or gallops Chest: clear to auscultation bilaterally, no wheezing rales or rhonchi Abdomen: soft nontender, nondistended, normal bowel sounds Extremities: no cyanosis, clubbing or edema noted bilaterally Neuro: Cranial nerves II-XII intact, no focal neurological deficits   The results of significant diagnostics from this hospitalization (including imaging, microbiology, ancillary and laboratory) are listed below for reference.      Procedures/Studies:  Dg Chest Port 1 View  Result Date: 07/02/2018 CLINICAL DATA:  [facility] EXAM: PORTABLE CHEST 1 VIEW COMPARISON:  05/28/2018, 11/13/2017 FINDINGS: Low lung volumes with chronic elevation of right diaphragm. Cardiomegaly without significant effusion. Chronic appearing left greater than right reticular changes/suspected chronic interstitial disease.  Aortic atherosclerosis. No pneumothorax. IMPRESSION: No active disease. Chronic cardiomegaly. 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 Electronically Signed   By: Donavan Foil M.D.   On: 07/02/2018 18:30      LAB RESULTS: Basic Metabolic Panel: Recent Labs  Lab 07/03/18 0436 07/04/18 0533  NA 135 139  K 3.7 4.3  CL 105 102  CO2 26 26  GLUCOSE 102* 136*  BUN 15 18  CREATININE 1.80* 2.01*  CALCIUM 8.7* 9.5   Liver Function Tests: Recent Labs  Lab 07/02/18 1819  AST 34  ALT 16  ALKPHOS 42  BILITOT 0.8  PROT 7.7  ALBUMIN 3.4*   No results for input(s): LIPASE, AMYLASE in the last 168 hours. No results for input(s): AMMONIA in the last 168 hours. CBC: Recent Labs  Lab 07/02/18 2006 07/04/18 0533  WBC 13.8* 9.0  HGB 10.8* 11.0*  HCT 33.1* 34.2*  MCV 93.2 92.7  PLT 300 331   Cardiac Enzymes: No results for input(s): CKTOTAL, CKMB, CKMBINDEX, TROPONINI in the last 168 hours. BNP: Invalid input(s): POCBNP CBG: Recent Labs  Lab 07/03/18 2122 07/04/18 0734  GLUCAP 197* 129*      Disposition and Follow-up: Discharge Instructions    Diet Carb Modified   Complete by:  As directed    Discharge instructions   Complete by:  As directed    It is VERY IMPORTANT that you follow up with a PCP on a regular basis.  Check your blood glucoses before each meal and at bedtime and maintain a log of your readings.  Bring this log with you when you follow up with Dr Forde Dandy so that he can adjust your insulin at your follow up visit.  Please DONOT take Humalog insulin. Follow the new insulin regimen with Lantus at bed time and sliding scale with meals.   Increase activity slowly   Complete by:  As directed        DISPOSITION: Laie, MD. Schedule an appointment as soon as possible for a visit in 1 week(s).   Specialty:  Endocrinology Contact information: [address] [place]  [id number] [phone]            Time coordinating discharge:  35 minutes  Signed:   Estill Cotta M.D. [facility] 07/04/2018, 10:21 AM Pager: [phone]

## 2018-07-10 ENCOUNTER — Telehealth: Payer: Self-pay | Admitting: Pharmacist

## 2018-07-10 NOTE — Telephone Encounter (Signed)
LMOM; [employer] Safety Net approved until 12/30/18 but patient not returning call to let us know if he is back on therapy.  SafetyNet number and clinic phone number left on answering machine.

## 2018-07-23 ENCOUNTER — Ambulatory Visit: Payer: Medicare Other | Admitting: Cardiovascular Disease

## 2018-07-24 DIAGNOSIS — M9904 Segmental and somatic dysfunction of sacral region: Secondary | ICD-10-CM | POA: Diagnosis not present

## 2018-07-24 DIAGNOSIS — S332XXA Dislocation of sacroiliac and sacrococcygeal joint, initial encounter: Secondary | ICD-10-CM | POA: Diagnosis not present

## 2018-08-15 DIAGNOSIS — S332XXA Dislocation of sacroiliac and sacrococcygeal joint, initial encounter: Secondary | ICD-10-CM | POA: Diagnosis not present

## 2018-08-15 DIAGNOSIS — M9904 Segmental and somatic dysfunction of sacral region: Secondary | ICD-10-CM | POA: Diagnosis not present

## 2018-08-15 DIAGNOSIS — M9901 Segmental and somatic dysfunction of cervical region: Secondary | ICD-10-CM | POA: Diagnosis not present

## 2018-08-15 DIAGNOSIS — S13160A Subluxation of C5/C6 cervical vertebrae, initial encounter: Secondary | ICD-10-CM | POA: Diagnosis not present

## 2018-08-18 DIAGNOSIS — S13160A Subluxation of C5/C6 cervical vertebrae, initial encounter: Secondary | ICD-10-CM | POA: Diagnosis not present

## 2018-08-18 DIAGNOSIS — M9901 Segmental and somatic dysfunction of cervical region: Secondary | ICD-10-CM | POA: Diagnosis not present

## 2018-08-18 DIAGNOSIS — M9904 Segmental and somatic dysfunction of sacral region: Secondary | ICD-10-CM | POA: Diagnosis not present

## 2018-08-18 DIAGNOSIS — S332XXA Dislocation of sacroiliac and sacrococcygeal joint, initial encounter: Secondary | ICD-10-CM | POA: Diagnosis not present

## 2018-08-20 ENCOUNTER — Ambulatory Visit: Payer: Medicare Other | Admitting: Cardiovascular Disease

## 2018-08-21 ENCOUNTER — Encounter: Payer: Self-pay | Admitting: *Deleted

## 2018-08-29 DIAGNOSIS — H401132 Primary open-angle glaucoma, bilateral, moderate stage: Secondary | ICD-10-CM | POA: Diagnosis not present

## 2018-08-29 DIAGNOSIS — H26491 Other secondary cataract, right eye: Secondary | ICD-10-CM | POA: Diagnosis not present

## 2018-08-29 DIAGNOSIS — E119 Type 2 diabetes mellitus without complications: Secondary | ICD-10-CM | POA: Diagnosis not present

## 2018-09-04 DIAGNOSIS — S332XXA Dislocation of sacroiliac and sacrococcygeal joint, initial encounter: Secondary | ICD-10-CM | POA: Diagnosis not present

## 2018-09-04 DIAGNOSIS — M9901 Segmental and somatic dysfunction of cervical region: Secondary | ICD-10-CM | POA: Diagnosis not present

## 2018-09-04 DIAGNOSIS — S13160A Subluxation of C5/C6 cervical vertebrae, initial encounter: Secondary | ICD-10-CM | POA: Diagnosis not present

## 2018-09-04 DIAGNOSIS — M9904 Segmental and somatic dysfunction of sacral region: Secondary | ICD-10-CM | POA: Diagnosis not present

## 2018-09-16 ENCOUNTER — Other Ambulatory Visit: Payer: Self-pay | Admitting: Family Medicine

## 2018-10-27 ENCOUNTER — Telehealth: Payer: Self-pay | Admitting: Pharmacist

## 2018-10-27 DIAGNOSIS — E7800 Pure hypercholesterolemia, unspecified: Secondary | ICD-10-CM

## 2018-10-27 NOTE — Telephone Encounter (Signed)
Patient assistance paperwork for 2020 mailed to patient together with order to repeat fastin lipid panel.

## 2018-12-06 ENCOUNTER — Other Ambulatory Visit: Payer: Self-pay | Admitting: Cardiology

## 2018-12-08 DIAGNOSIS — N184 Chronic kidney disease, stage 4 (severe): Secondary | ICD-10-CM | POA: Diagnosis not present

## 2018-12-08 DIAGNOSIS — I5040 Unspecified combined systolic (congestive) and diastolic (congestive) heart failure: Secondary | ICD-10-CM | POA: Diagnosis not present

## 2018-12-08 DIAGNOSIS — E559 Vitamin D deficiency, unspecified: Secondary | ICD-10-CM | POA: Diagnosis not present

## 2018-12-08 DIAGNOSIS — R634 Abnormal weight loss: Secondary | ICD-10-CM | POA: Diagnosis not present

## 2018-12-08 DIAGNOSIS — E7849 Other hyperlipidemia: Secondary | ICD-10-CM | POA: Diagnosis not present

## 2018-12-08 DIAGNOSIS — E1140 Type 2 diabetes mellitus with diabetic neuropathy, unspecified: Secondary | ICD-10-CM | POA: Diagnosis not present

## 2018-12-08 DIAGNOSIS — G4733 Obstructive sleep apnea (adult) (pediatric): Secondary | ICD-10-CM | POA: Diagnosis not present

## 2018-12-08 DIAGNOSIS — N401 Enlarged prostate with lower urinary tract symptoms: Secondary | ICD-10-CM | POA: Diagnosis not present

## 2018-12-08 DIAGNOSIS — Z794 Long term (current) use of insulin: Secondary | ICD-10-CM | POA: Diagnosis not present

## 2018-12-08 DIAGNOSIS — D631 Anemia in chronic kidney disease: Secondary | ICD-10-CM | POA: Diagnosis not present

## 2018-12-08 DIAGNOSIS — I779 Disorder of arteries and arterioles, unspecified: Secondary | ICD-10-CM | POA: Diagnosis not present

## 2018-12-08 DIAGNOSIS — Z6826 Body mass index (BMI) 26.0-26.9, adult: Secondary | ICD-10-CM | POA: Diagnosis not present

## 2019-01-01 ENCOUNTER — Other Ambulatory Visit: Payer: Self-pay | Admitting: Cardiology

## 2019-01-09 ENCOUNTER — Encounter: Payer: Self-pay | Admitting: Cardiovascular Disease

## 2019-01-09 ENCOUNTER — Ambulatory Visit (INDEPENDENT_AMBULATORY_CARE_PROVIDER_SITE_OTHER): Payer: Medicare Other | Admitting: Cardiovascular Disease

## 2019-01-09 VITALS — BP 140/88 | HR 112 | Ht 71.0 in | Wt 194.6 lb

## 2019-01-09 DIAGNOSIS — I110 Hypertensive heart disease with heart failure: Secondary | ICD-10-CM | POA: Diagnosis not present

## 2019-01-09 DIAGNOSIS — I10 Essential (primary) hypertension: Secondary | ICD-10-CM | POA: Diagnosis not present

## 2019-01-09 DIAGNOSIS — N183 Chronic kidney disease, stage 3 unspecified: Secondary | ICD-10-CM

## 2019-01-09 DIAGNOSIS — I5042 Chronic combined systolic (congestive) and diastolic (congestive) heart failure: Secondary | ICD-10-CM | POA: Diagnosis not present

## 2019-01-09 DIAGNOSIS — I4819 Other persistent atrial fibrillation: Secondary | ICD-10-CM

## 2019-01-09 NOTE — Progress Notes (Signed)
Cardiology Office Note   Date:  01/09/2019   ID:  Julian Ross, DOB 1946-01-27, MRN [id number]  PCP:  Reynold Bowen, MD  Cardiologist:   Skeet Latch, MD  CT Surgeon: Dr. [name]  No chief complaint on file.    History of Present Illness: Julian Ross is a 73 y.o. male with chronic systolic and diastolic heart failure LVEF improved from 30-35% to 50-55%, paroxysmal atrial fibrillation, CAD s/p multiple PCIs, who presents for follow up.  Julian Ross was admitted to the hospital 03/18/16-04/02/16 with sepsis, hypoxic respiratory failure, metabolic encephalopathy and acute on chronic heart failure.  During that hospitalization he also had elevated troponin consistent with demand ischemia (troponin 0.13) and new onset atrial fibrillation with RVR.  He was diuresed to a discharge weight of 196 lb (from 208 lb on admit).  Julian Ross has an extensive history of CAD.  He previously had 16 stents placed in [place], [place].  His cardiologist there is [name].  The inpatient team contacted [name], who informed them that Julian Ross has a tight RCA lesion but no recent interventions.  He felt that Julian Ross could transition from Plavix to aspirin.  He has been intolerant to statins in the past.    Julian Ross underwent DCCV on 06/04/16.  After cardioversion he developed bradycardia and initially metoprolol was reduced.  Then he was switched from metoprolol to carvedilol.  This was further reduced to 6.25mg  bid on 06/19/16.  On 7/21 he had a recurrent episode of syncope. Hydralazine and isosorbide were discontinued.  Troponin was mildly elevated at 0.03.  CT of the head was negative, and chest x-ray showed no acute edema or infiltrate.  During that hospitalization he was noted to be orthostatic.  His SBP dropped to 80 when standing.  Carvedilol was reduced 1.5625 mg bid and his orthostasis was improved.  Echo that admission showed an improvement in his LVEF to 50-55%. He wore an event monitor 07/31/16 that  revealed short runs of atrial fibrillation as well as PVCs and PACs. He reported angina and underwent LHC  10/03/16 where he was noted to have severe, diffuse, three-vessel coronary disease with multiple areas of prior stenting. A recommendation was made for intensification of medical therapy or consideration of coronary artery bypass grafting. He was scheduled for CABG/MAZE but developed pneumonia requiring hospitalization 10/2016.  He had significant clinical improvement and a decision made was made to continue medical management unless his symptoms worsened.  He started on Repatha 03/2017.  Julian Ross was seen by Julian Ross on 10/30/18.  His weight was up to 232 pounds.  Lasix was increased to 80 mg twice daily and low-dose Isordil was added to his regimen.  He had a repeat echocardiogram 11/04/17 that revealed LVEF 45-50% with inferolateral and basal to mid inferior walls.  He was euvolemic but reported increased chest pain.  Ranexa was increased to 1000 mg twice daily.  Julian Ross has been feeling well lately.  He hasn't had any chest pain and his breathing has been stable.  His wife passed away in 2023-09-15.  She developed an MRSA infection after back surgery.  He has struggled with his glucose control since that time.  He went to a diabetes education class which has helped.  His blood glucose sometimes gets low in the afternoon.  He didn't have any breakfast this AM so he didn't take his medication.  In general his BP and heart rate have been controlled.  He has mild  edema that improves with elevation of his legs.  He denies orthopnea or PND.  He hasn't had Repatha in a couple months and needs his assistance forms resent.    Past Medical History:  Diagnosis Date  . Anemia   . Anxiety state   . CAD (coronary artery disease)    16 prior stents (at [facility] [place])  . Cardiomyopathy, ischemic   . Chronic lower back pain   . Chronic systolic CHF (congestive heart failure) ([place])   . CKD (chronic kidney  disease), stage III ([place])   . Depression   . Diabetes mellitus type 2 in obese ([place])   . Fibromyalgia   . Hepatitis A    at age 66  . High cholesterol   . History of blood transfusion    "related to OR"  . Hypertension   . Hypokalemia   . MI (myocardial infarction) ([place]) 1990; 1995; 1997  . OSA on CPAP    setting = 13, full face mask  . Osteoarthritis   . Persistent atrial fibrillation 02/2016   on Eliquis  . Rheumatoid arthritis of multiple sites with negative rheumatoid factor ([place])   . Septic shock ([place]) 02/2016   in setting of severe pneumonia    Past Surgical History:  Procedure Laterality Date  . BACK SURGERY    . CARDIAC CATHETERIZATION N/A 10/03/2016   Procedure: Left Heart Cath and Coronary Angiography;  Surgeon: Belva Crome, MD;  Location: [facility];  Service: Cardiovascular;  Laterality: N/A;  . CARDIOVERSION N/A 06/04/2016   Procedure: CARDIOVERSION;  Surgeon: Skeet Latch, MD;  Location: [facility];  Service: Cardiovascular;  Laterality: N/A;  . CATARACT EXTRACTION W/ INTRAOCULAR LENS  IMPLANT, BILATERAL Bilateral 2000s  . CORONARY ANGIOPLASTY  early 34s  . CORONARY ANGIOPLASTY WITH STENT PLACEMENT     "I've got 15 stents" (07/19/2016)  . KNEE ARTHROSCOPY Left 1990s  . LEFT HEART CATH AND CORONARY ANGIOGRAPHY N/A 11/15/2017   Procedure: LEFT HEART CATH AND CORONARY ANGIOGRAPHY;  Surgeon: Jettie Booze, MD;  Location: [facility];  Service: Cardiovascular;  Laterality: N/A;  . NASAL SINUS SURGERY    . POSTERIOR LUMBAR FUSION  2015   L4-5-S1  . right knee surgery     age of 60  . TONSILLECTOMY AND ADENOIDECTOMY  1949     Current Outpatient Medications  Medication Sig Dispense Refill  . apixaban (ELIQUIS) 5 MG TABS tablet Take 1 tablet (5 mg total) by mouth 2 (two) times daily. 60 tablet   . aspirin EC 81 MG EC tablet Take 1 tablet (81 mg total) daily by mouth.    . carvedilol (COREG) 12.5 MG tablet Take 1 tablet (12.5 mg total)  by mouth 2 (two) times daily with a meal. 180 tablet 1  . clopidogrel (PLAVIX) 75 MG tablet Take 1 tablet (75 mg total) by mouth daily. 90 tablet 1  . Evolocumab (REPATHA SURECLICK) 563 MG/ML SOAJ Inject 140 mg into the skin every 14 (fourteen) days. 2 pen 11  . fenofibrate (TRICOR) 145 MG tablet Take 145 mg by mouth daily.    . insulin aspart (NOVOLOG) 100 UNIT/ML injection Inject 0-9 Units into the skin 3 (three) times daily with meals. Sliding scale CBG 70 - 120: 0 units CBG 121 - 150: 1 unit,  CBG 151 - 200: 2 units,  CBG 201 - 250: 3 units,  CBG 251 - 300: 5 units,  CBG 301 - 350: 7 units,  CBG 351 -  400: 9 units   CBG > 400: 9 units and call your doctor's office 10 mL 3  . insulin glargine (LANTUS) 100 UNIT/ML injection Inject 0.18 mLs (18 Units total) into the skin at bedtime. 10 mL 11  . isosorbide mononitrate (IMDUR) 60 MG 24 hr tablet Take 1 tablet (60 mg total) by mouth 2 (two) times daily. 180 tablet 1  . LUMIGAN 0.01 % SOLN Place 1 drop into both eyes at bedtime.   0  . Misc Natural Products (OSTEO BI-FLEX ADV DOUBLE ST PO) Take 1 tablet by mouth daily as needed (takes occassionally).     Marland Kitchen omega-3 acid ethyl esters (LOVAZA) 1 g capsule Take 1 capsule (1 g total) by mouth 2 (two) times daily. 60 capsule 10  . pantoprazole (PROTONIX) 40 MG tablet TAKE 1 TABLET(40 MG) BY MOUTH DAILY 90 tablet 0  . PARoxetine (PAXIL) 40 MG tablet Take 40 mg by mouth daily.   11  . predniSONE (DELTASONE) 5 MG tablet Take 5 mg by mouth daily with breakfast.    . ranolazine (RANEXA) 1000 MG SR tablet Take 1 tablet (1,000 mg total) by mouth 2 (two) times daily. 60 tablet 5  . tamsulosin (FLOMAX) 0.4 MG CAPS capsule Take 0.4 mg by mouth daily.   4  . tiZANidine (ZANAFLEX) 4 MG tablet Take 1 tablet by mouth every 8 (eight) hours as needed for muscle spasms.   2  . topiramate (TOPAMAX) 25 MG tablet Take 25 mg by mouth 2 (two) times daily.    Marland Kitchen VITAMIN D, ERGOCALCIFEROL, PO Take 5,000 Units by mouth daily.     Marland Kitchen  ZETIA 10 MG tablet Take 10 mg by mouth daily.   11  . ALPRAZolam (XANAX) 0.5 MG tablet Take 1 tablet (0.5 mg total) by mouth at bedtime as needed for anxiety or sleep. (Patient not taking: Reported on 01/09/2019) 20 tablet 0  . nitroGLYCERIN (NITROSTAT) 0.4 MG SL tablet PLACE 1 TABLET UNDER THE TONGUE IF NEEDED (Patient not taking: Reported on 01/09/2019) 25 tablet 11  . traMADol (ULTRAM) 50 MG tablet Take 1 tablet (50 mg total) by mouth 3 (three) times daily as needed (pain). Take as directed (Patient not taking: Reported on 01/09/2019) 30 tablet 0   No current facility-administered medications for this visit.     Allergies:   Statins; Ace inhibitors; Contrast media [iodinated diagnostic agents]; and Penicillins    Social History:  The patient  reports that he has never smoked. He has never used smokeless tobacco. He reports that he does not drink alcohol or use drugs.   Family History:  The patient's family history includes Heart attack in his brother and father; Heart failure in his mother; Hyperlipidemia in his mother; Hypertension in his mother.    ROS:  Please see the history of present illness.   Otherwise, review of systems are positive for gait instability.   All other systems are reviewed and negative.    PHYSICAL EXAM: VS:  BP 140/88   Pulse (!) 112   Ht 5\' 11"  (1.803 m)   Wt 194 lb 9.6 oz (88.3 kg)   BMI 27.14 kg/m  , BMI Body mass index is 27.14 kg/m. GENERAL:  Chronically ill-appearing HEENT: Pupils equal round and reactive, fundi not visualized, oral mucosa unremarkable NECK:  No jugular venous distention, waveform within normal limits, carotid upstroke brisk and symmetric, no bruits, no thyromegaly LYMPHATICS:  No cervical adenopathy LUNGS:  Clear to auscultation bilaterally HEART:  Irregularly irregular.  Tachycardic.   PMI not displaced or sustained,S1 and S2 within normal limits, no S3, no S4, no clicks, no rubs, no murmurs ABD:  Flat, positive bowel sounds normal in  frequency in pitch, no bruits, no rebound, no guarding, no midline pulsatile mass, no hepatomegaly, no splenomegaly EXT:  Unable to palpate DP/PT pulses, no edema, no cyanosis no clubbing SKIN:  No rashes no nodules NEURO:  Cranial nerves II through XII grossly intact, motor grossly intact throughout PSYCH:  Cognitively intact, oriented to person place and time   EKG:  EKG is ordered today. The ekg ordered 05/22/16 demonstrates atrial fibrillation rate 68 bpm.   09/28/16: Atrial fibrillation rate 84 bpm.  Non-specific ST changes.  QT prolongation.  11/13/17: Atrial fibrillation.  Rate 88 bpm.  Prior inferior infarct. 05/05/18: Atrial fibrillation.  Rate 87 bpm.  Prior inferior infarct.   01/09/19: Atrial fibrillation.  Rate 109 bpm.  Inferior T wave abnormality.    Echo 07/20/16: Study Conclusions  - Left ventricle: The cavity size was normal. Wall thickness was   increased in a pattern of mild LVH. Systolic function was normal.   The estimated ejection fraction was in the range of 50% to 55%.   There is akinesis of the basalinferior myocardium. There is   akinesis of the inferolateral myocardium. Features are consistent   with a pseudonormal left ventricular filling pattern, with   concomitant abnormal relaxation and increased filling pressure   (grade 2 diastolic dysfunction). Doppler parameters are   consistent with high ventricular filling pressure. - Left atrium: The atrium was mildly dilated. - Right atrium: The atrium was mildly dilated.  Impressions:  - Akinesis of the basal inferior wall and inferior lateral wall;   overall low normal LV systolic function; grade 2 diastolic   dysfunction with elevated LV filling pressure; mild biatrial   enlargment; trace MR and TR.  Echo 11/04/17: LVEF 45-50%.  Wall motion unchanged from 07/20/16.  PASP 39 mmHg.   LHC 10/03/16: Ost Cx to Dist Cx lesion, 65 %stenosed.  2nd Mrg lesion, 75 %stenosed.  Dist Cx lesion, 80  %stenosed.  Dist LAD lesion, 90 %stenosed.  Mid LAD lesion, 80 %stenosed.  Prox LAD to Mid LAD lesion, 70 %stenosed.   1st Diag lesion, 75 %stenosed.  1st RPLB lesion, 100 %stenosed.  Prox RCA to Dist RCA lesion, 100 %stenosed.  LV end diastolic pressure is mildly elevated.    Severe diffuse three-vessel coronary disease in this patient with multiple prior stents.  Total occlusion of the right coronary within the ostial segment. Distal vessel fills by collaterals from the left coronary. The distal right coronary is small and may not be graftable.  Severe diffuse LAD disease with 70% proximal stenosis, 80% mid stenosis, and 90% apical stenosis. The first diagonal which is relatively small contains segmental 70-80% stenosis.  Heavily stented proximal to mid circumflex coronary artery with 50-70% in-stent restenosis, diffuse. Large second obtuse marginal with 75% proximal diffuse narrowing. 70-80% segmental narrowing in the mid circumflex after the origin of the second obtuse marginal.  Mildly elevated LV EDP. Recent echo demonstrating EF greater than 50%. Contrast LV gram not performed due to chronic kidney disease. Total contrast used was 80 cc.  Recent Labs: 05/05/2018: NT-Pro BNP 622 06/01/2018: Magnesium 1.8 07/02/2018: ALT 16 07/04/2018: BUN 18; Creatinine, Ser 2.01; Hemoglobin 11.0; Platelets 331; Potassium 4.3; Sodium 139    Lipid Panel    Component Value Date/Time   CHOL 98 05/29/2018 1004   CHOL 167  05/05/2018 0939   TRIG 87 05/29/2018 1004   HDL 17 (L) 05/29/2018 1004   HDL 46 05/05/2018 0939   CHOLHDL 5.8 05/29/2018 1004   VLDL 17 05/29/2018 1004   LDLCALC 64 05/29/2018 1004   LDLCALC 93 05/05/2018 0939      Wt Readings from Last 3 Encounters:  01/09/19 194 lb 9.6 oz (88.3 kg)  07/02/18 200 lb (90.7 kg)  05/28/18 202 lb (91.6 kg)      ASSESSMENT AND PLAN:  # CAD s/p PCI: # CCS Class III angina:  Chest pain improved with increasing Ranexa.  Continue  Aspirin, clopidogrel, ezetimibe, fenofibrate, carvedilol, Imdur, Repatha, and Ranexa.    # Chronic systolic and diastolic heart failure:   LVEF slightly declined to 45-50% from 50-55% previously. He is euvolemic and doing well.  Continue carvedilol and Imdur.  He isn't on an ARB 2/2 CKD.   # Hypertension/hypotension: BP elevated but he hasn't taken any meds today.  Continue to monitor at home.  # Atrial fibrillation: He remains in atrial fibrillation after cardioversion but rates are well-controlled when on carvedilol.  He is asymptomatic.  Continue Eliquis and carvedilol.  His patients CHA2DS2-VASc Score and unadjusted Ischemic Stroke Rate (% per year) is equal to 7.2 % stroke rate/year from a score of 5  Above score calculated as 1 point each if present [CHF, HTN, DM, Vascular=MI/PAD/Aortic Plaque, Age if 65-74, or Male] Above score calculated as 2 points each if present [Age > 75, or Stroke/TIA/TE]    Current medicines are reviewed at length with the patient today.  The patient does not have concerns regarding medicines.  The following changes have been made: none  Labs/ tests ordered today include:   No orders of the defined types were placed in this encounter.    Disposition:   FU with Anvay Tennis C. [name], MD, [facility] in 2-3 months.      Signed, Rockney Grenz C. [name], [place], [facility]  01/09/2019 10:24 AM    [place]

## 2019-01-09 NOTE — Patient Instructions (Signed)
Medication Instructions:  Your physician recommends that you continue on your current medications as directed. Please refer to the Current Medication list given to you today.  If you need a refill on your cardiac medications before your next appointment, please call your pharmacy.   Lab work: NONE  Testing/Procedures: NONE  Follow-Up: At [facility], you and your health needs are our priority.  As part of our continuing mission to provide you with exceptional heart care, we have created designated Provider Care Teams.  These Care Teams include your primary Cardiologist (physician) and Advanced Practice Providers (APPs -  Physician Assistants and Nurse Practitioners) who all work together to provide you with the care you need, when you need it. You will need a follow up appointment in 2-3 months. You may see DR [facility] or one of the following Advanced Practice Providers on your designated Care Team:   Kerin Ransom, PA-C Roby Lofts, [place] . Sande Rives, PA-C  WILL HAVE THE PHARMACIST MAIL YOU THE INFORMATION ON THE [place]

## 2019-01-12 ENCOUNTER — Telehealth: Payer: Self-pay | Admitting: Pharmacist

## 2019-01-12 NOTE — Telephone Encounter (Signed)
Patient lost [facility] paperwork sent in October. New form mailed to patient for completion.

## 2019-01-19 DIAGNOSIS — S13160A Subluxation of C5/C6 cervical vertebrae, initial encounter: Secondary | ICD-10-CM | POA: Diagnosis not present

## 2019-01-19 DIAGNOSIS — M9901 Segmental and somatic dysfunction of cervical region: Secondary | ICD-10-CM | POA: Diagnosis not present

## 2019-01-19 DIAGNOSIS — M9904 Segmental and somatic dysfunction of sacral region: Secondary | ICD-10-CM | POA: Diagnosis not present

## 2019-01-19 DIAGNOSIS — S332XXA Dislocation of sacroiliac and sacrococcygeal joint, initial encounter: Secondary | ICD-10-CM | POA: Diagnosis not present

## 2019-01-23 ENCOUNTER — Other Ambulatory Visit: Payer: Self-pay | Admitting: Cardiology

## 2019-01-27 DIAGNOSIS — Z794 Long term (current) use of insulin: Secondary | ICD-10-CM | POA: Diagnosis not present

## 2019-01-27 DIAGNOSIS — N184 Chronic kidney disease, stage 4 (severe): Secondary | ICD-10-CM | POA: Diagnosis not present

## 2019-01-27 DIAGNOSIS — I10 Essential (primary) hypertension: Secondary | ICD-10-CM | POA: Diagnosis not present

## 2019-01-27 DIAGNOSIS — Z6826 Body mass index (BMI) 26.0-26.9, adult: Secondary | ICD-10-CM | POA: Diagnosis not present

## 2019-01-27 DIAGNOSIS — E1140 Type 2 diabetes mellitus with diabetic neuropathy, unspecified: Secondary | ICD-10-CM | POA: Diagnosis not present

## 2019-01-29 DIAGNOSIS — J310 Chronic rhinitis: Secondary | ICD-10-CM | POA: Diagnosis not present

## 2019-01-29 DIAGNOSIS — S13160A Subluxation of C5/C6 cervical vertebrae, initial encounter: Secondary | ICD-10-CM | POA: Diagnosis not present

## 2019-01-29 DIAGNOSIS — E559 Vitamin D deficiency, unspecified: Secondary | ICD-10-CM | POA: Diagnosis not present

## 2019-01-29 DIAGNOSIS — M25542 Pain in joints of left hand: Secondary | ICD-10-CM | POA: Diagnosis not present

## 2019-01-29 DIAGNOSIS — Z79899 Other long term (current) drug therapy: Secondary | ICD-10-CM | POA: Diagnosis not present

## 2019-01-29 DIAGNOSIS — J84112 Idiopathic pulmonary fibrosis: Secondary | ICD-10-CM | POA: Diagnosis not present

## 2019-01-29 DIAGNOSIS — S332XXA Dislocation of sacroiliac and sacrococcygeal joint, initial encounter: Secondary | ICD-10-CM | POA: Diagnosis not present

## 2019-01-29 DIAGNOSIS — M9901 Segmental and somatic dysfunction of cervical region: Secondary | ICD-10-CM | POA: Diagnosis not present

## 2019-01-29 DIAGNOSIS — R251 Tremor, unspecified: Secondary | ICD-10-CM | POA: Diagnosis not present

## 2019-01-29 DIAGNOSIS — M0579 Rheumatoid arthritis with rheumatoid factor of multiple sites without organ or systems involvement: Secondary | ICD-10-CM | POA: Diagnosis not present

## 2019-01-29 DIAGNOSIS — M9904 Segmental and somatic dysfunction of sacral region: Secondary | ICD-10-CM | POA: Diagnosis not present

## 2019-01-29 DIAGNOSIS — M25541 Pain in joints of right hand: Secondary | ICD-10-CM | POA: Diagnosis not present

## 2019-01-29 DIAGNOSIS — G4733 Obstructive sleep apnea (adult) (pediatric): Secondary | ICD-10-CM | POA: Diagnosis not present

## 2019-02-02 ENCOUNTER — Other Ambulatory Visit: Payer: Self-pay | Admitting: Psychiatry

## 2019-02-02 DIAGNOSIS — E559 Vitamin D deficiency, unspecified: Secondary | ICD-10-CM | POA: Diagnosis not present

## 2019-02-02 DIAGNOSIS — G8929 Other chronic pain: Secondary | ICD-10-CM | POA: Diagnosis not present

## 2019-02-02 DIAGNOSIS — R51 Headache: Secondary | ICD-10-CM | POA: Diagnosis not present

## 2019-02-02 DIAGNOSIS — G4700 Insomnia, unspecified: Secondary | ICD-10-CM | POA: Diagnosis not present

## 2019-02-02 DIAGNOSIS — M6281 Muscle weakness (generalized): Secondary | ICD-10-CM | POA: Diagnosis not present

## 2019-02-02 DIAGNOSIS — R209 Unspecified disturbances of skin sensation: Secondary | ICD-10-CM | POA: Diagnosis not present

## 2019-02-02 DIAGNOSIS — G609 Hereditary and idiopathic neuropathy, unspecified: Secondary | ICD-10-CM | POA: Diagnosis not present

## 2019-02-02 DIAGNOSIS — R413 Other amnesia: Secondary | ICD-10-CM | POA: Diagnosis not present

## 2019-02-02 DIAGNOSIS — G250 Essential tremor: Secondary | ICD-10-CM | POA: Diagnosis not present

## 2019-02-02 DIAGNOSIS — R5383 Other fatigue: Secondary | ICD-10-CM | POA: Diagnosis not present

## 2019-02-03 ENCOUNTER — Other Ambulatory Visit: Payer: Self-pay | Admitting: *Deleted

## 2019-02-03 MED ORDER — OMEGA-3-ACID ETHYL ESTERS 1 G PO CAPS: 1.0000 | ORAL_CAPSULE | Freq: Two times a day (BID) | ORAL | 11 refills | Status: AC

## 2019-02-06 ENCOUNTER — Other Ambulatory Visit: Payer: Self-pay | Admitting: Psychiatry

## 2019-02-06 ENCOUNTER — Other Ambulatory Visit (HOSPITAL_COMMUNITY): Payer: Self-pay | Admitting: Psychiatry

## 2019-02-06 DIAGNOSIS — R51 Headache: Principal | ICD-10-CM

## 2019-02-06 DIAGNOSIS — R519 Headache, unspecified: Secondary | ICD-10-CM

## 2019-02-14 ENCOUNTER — Ambulatory Visit (HOSPITAL_COMMUNITY)
Admission: RE | Admit: 2019-02-14 | Discharge: 2019-02-14 | Disposition: A | Discharge status: Home / Self Care | Payer: Medicare Other | Source: Ambulatory Visit | Attending: Psychiatry | Admitting: Psychiatry

## 2019-02-14 DIAGNOSIS — R51 Headache: Secondary | ICD-10-CM | POA: Diagnosis not present

## 2019-02-14 DIAGNOSIS — R55 Syncope and collapse: Secondary | ICD-10-CM | POA: Diagnosis not present

## 2019-02-14 DIAGNOSIS — R519 Headache, unspecified: Secondary | ICD-10-CM

## 2019-02-16 ENCOUNTER — Other Ambulatory Visit: Payer: Self-pay | Admitting: Cardiovascular Disease

## 2019-02-16 ENCOUNTER — Other Ambulatory Visit: Payer: Self-pay | Admitting: Cardiology

## 2019-02-26 DIAGNOSIS — M0579 Rheumatoid arthritis with rheumatoid factor of multiple sites without organ or systems involvement: Secondary | ICD-10-CM | POA: Diagnosis not present

## 2019-02-26 DIAGNOSIS — M79671 Pain in right foot: Secondary | ICD-10-CM | POA: Diagnosis not present

## 2019-02-26 DIAGNOSIS — M79672 Pain in left foot: Secondary | ICD-10-CM | POA: Diagnosis not present

## 2019-02-26 DIAGNOSIS — G8929 Other chronic pain: Secondary | ICD-10-CM | POA: Diagnosis not present

## 2019-03-04 DIAGNOSIS — H401134 Primary open-angle glaucoma, bilateral, indeterminate stage: Secondary | ICD-10-CM | POA: Diagnosis not present

## 2019-03-13 DIAGNOSIS — R51 Headache: Secondary | ICD-10-CM | POA: Diagnosis not present

## 2019-03-16 DIAGNOSIS — S13160A Subluxation of C5/C6 cervical vertebrae, initial encounter: Secondary | ICD-10-CM | POA: Diagnosis not present

## 2019-03-16 DIAGNOSIS — M9901 Segmental and somatic dysfunction of cervical region: Secondary | ICD-10-CM | POA: Diagnosis not present

## 2019-03-16 DIAGNOSIS — S332XXA Dislocation of sacroiliac and sacrococcygeal joint, initial encounter: Secondary | ICD-10-CM | POA: Diagnosis not present

## 2019-03-16 DIAGNOSIS — M9904 Segmental and somatic dysfunction of sacral region: Secondary | ICD-10-CM | POA: Diagnosis not present

## 2019-03-25 ENCOUNTER — Other Ambulatory Visit: Payer: Self-pay | Admitting: Cardiovascular Disease

## 2019-03-26 DIAGNOSIS — S332XXA Dislocation of sacroiliac and sacrococcygeal joint, initial encounter: Secondary | ICD-10-CM | POA: Diagnosis not present

## 2019-03-26 DIAGNOSIS — M9904 Segmental and somatic dysfunction of sacral region: Secondary | ICD-10-CM | POA: Diagnosis not present

## 2019-03-26 DIAGNOSIS — S13160A Subluxation of C5/C6 cervical vertebrae, initial encounter: Secondary | ICD-10-CM | POA: Diagnosis not present

## 2019-03-26 DIAGNOSIS — M9901 Segmental and somatic dysfunction of cervical region: Secondary | ICD-10-CM | POA: Diagnosis not present

## 2019-04-06 DIAGNOSIS — M9904 Segmental and somatic dysfunction of sacral region: Secondary | ICD-10-CM | POA: Diagnosis not present

## 2019-04-06 DIAGNOSIS — S13160A Subluxation of C5/C6 cervical vertebrae, initial encounter: Secondary | ICD-10-CM | POA: Diagnosis not present

## 2019-04-06 DIAGNOSIS — M9901 Segmental and somatic dysfunction of cervical region: Secondary | ICD-10-CM | POA: Diagnosis not present

## 2019-04-06 DIAGNOSIS — S332XXA Dislocation of sacroiliac and sacrococcygeal joint, initial encounter: Secondary | ICD-10-CM | POA: Diagnosis not present

## 2019-04-10 ENCOUNTER — Telehealth: Payer: Self-pay

## 2019-04-10 NOTE — Telephone Encounter (Signed)
Contacted patient to convert his appointment into a virtual visit but wasn't able to reach him by phone and couldn't leave a message because mail box is full.

## 2019-04-13 ENCOUNTER — Ambulatory Visit: Payer: Medicare Other | Admitting: Cardiovascular Disease

## 2019-04-17 DIAGNOSIS — I779 Disorder of arteries and arterioles, unspecified: Secondary | ICD-10-CM | POA: Diagnosis not present

## 2019-04-17 DIAGNOSIS — D631 Anemia in chronic kidney disease: Secondary | ICD-10-CM | POA: Diagnosis not present

## 2019-04-17 DIAGNOSIS — I5040 Unspecified combined systolic (congestive) and diastolic (congestive) heart failure: Secondary | ICD-10-CM | POA: Diagnosis not present

## 2019-04-17 DIAGNOSIS — I255 Ischemic cardiomyopathy: Secondary | ICD-10-CM | POA: Diagnosis not present

## 2019-04-17 DIAGNOSIS — I480 Paroxysmal atrial fibrillation: Secondary | ICD-10-CM | POA: Diagnosis not present

## 2019-04-17 DIAGNOSIS — E1142 Type 2 diabetes mellitus with diabetic polyneuropathy: Secondary | ICD-10-CM | POA: Diagnosis not present

## 2019-04-17 DIAGNOSIS — R269 Unspecified abnormalities of gait and mobility: Secondary | ICD-10-CM | POA: Diagnosis not present

## 2019-04-17 DIAGNOSIS — I2510 Atherosclerotic heart disease of native coronary artery without angina pectoris: Secondary | ICD-10-CM | POA: Diagnosis not present

## 2019-04-17 DIAGNOSIS — G3184 Mild cognitive impairment, so stated: Secondary | ICD-10-CM | POA: Diagnosis not present

## 2019-04-17 DIAGNOSIS — E1140 Type 2 diabetes mellitus with diabetic neuropathy, unspecified: Secondary | ICD-10-CM | POA: Diagnosis not present

## 2019-04-17 DIAGNOSIS — N183 Chronic kidney disease, stage 3 (moderate): Secondary | ICD-10-CM | POA: Diagnosis not present

## 2019-04-17 DIAGNOSIS — G4733 Obstructive sleep apnea (adult) (pediatric): Secondary | ICD-10-CM | POA: Diagnosis not present

## 2019-04-23 DIAGNOSIS — Z79899 Other long term (current) drug therapy: Secondary | ICD-10-CM | POA: Diagnosis not present

## 2019-04-23 DIAGNOSIS — Z79891 Long term (current) use of opiate analgesic: Secondary | ICD-10-CM | POA: Diagnosis not present

## 2019-04-23 DIAGNOSIS — M0579 Rheumatoid arthritis with rheumatoid factor of multiple sites without organ or systems involvement: Secondary | ICD-10-CM | POA: Diagnosis not present

## 2019-04-23 DIAGNOSIS — M1712 Unilateral primary osteoarthritis, left knee: Secondary | ICD-10-CM | POA: Diagnosis not present

## 2019-04-23 DIAGNOSIS — E559 Vitamin D deficiency, unspecified: Secondary | ICD-10-CM | POA: Diagnosis not present

## 2019-04-23 DIAGNOSIS — R251 Tremor, unspecified: Secondary | ICD-10-CM | POA: Diagnosis not present

## 2019-04-23 DIAGNOSIS — M1711 Unilateral primary osteoarthritis, right knee: Secondary | ICD-10-CM | POA: Diagnosis not present

## 2019-05-02 DIAGNOSIS — I469 Cardiac arrest, cause unspecified: Secondary | ICD-10-CM | POA: Diagnosis not present

## 2019-05-02 DIAGNOSIS — E785 Hyperlipidemia, unspecified: Secondary | ICD-10-CM | POA: Diagnosis not present

## 2019-05-06 ENCOUNTER — Telehealth: Payer: Self-pay | Admitting: Cardiovascular Disease

## 2019-05-06 MED ORDER — FUROSEMIDE 40 MG PO TABS: 40.0000 mg | ORAL_TABLET | Freq: Every day | ORAL | 3 refills | Status: AC | PRN

## 2019-05-06 NOTE — Telephone Encounter (Signed)
Pt's caregiver aware of recommendations and will start Furosemide  tom and pt has appt 5/27 will keep as is .Julian Ross will call back if no improvement and will move appt up Verbalized understanding ./cy

## 2019-05-06 NOTE — Telephone Encounter (Signed)
New Message   Pt c/o swelling: STAT is pt has developed SOB within 24 hours  1) How much weight have you gained and in what time span? They dont know if he has gained weight, they used a home scale today and he weighed 197. She said he may have gained around 4 lbs   2) If swelling, where is the swelling located? Left Leg   3) Are you currently taking a fluid pill? Yes  4) Are you currently SOB? A little short of breath   5) Do you have a log of your daily weights (if so, list)? No   6) Have you gained 3 pounds in a day or 5 pounds in a week? Unsure. The pt hasn't been weighed   7) Have you traveled recently? No

## 2019-05-06 NOTE — Telephone Encounter (Signed)
Pt gave permission to speak with Ivin Booty pt's caregiver re medical concerns Per Ivin Booty left leg has been swollen for a few days worse today from calf to ankle Pt did have ham this am but swelling was there prior B/P today was 150/99 again had the ham this am Reviewed pt's chart and appears pt had been on Furosemide in past but was stopped on last discharge summary and was suppose to f/u with PMD  and get BMET this was never done as pt's wife had passed away in August 25, 2023. Pt has virtual appt made with Kerin Ransom PA end of month Will forward to Dr [name] for review and recommendations .Adonis Housekeeper

## 2019-05-06 NOTE — Telephone Encounter (Signed)
Start lasix 40mg  daily until swelling has improved.  Then as needed.  Can his follow up be sooner, either with me or Lurena Joiner?

## 2019-05-07 DIAGNOSIS — N184 Chronic kidney disease, stage 4 (severe): Secondary | ICD-10-CM | POA: Diagnosis not present

## 2019-05-07 DIAGNOSIS — E1129 Type 2 diabetes mellitus with other diabetic kidney complication: Secondary | ICD-10-CM | POA: Diagnosis not present

## 2019-05-07 DIAGNOSIS — E7849 Other hyperlipidemia: Secondary | ICD-10-CM | POA: Diagnosis not present

## 2019-05-13 ENCOUNTER — Other Ambulatory Visit: Payer: Self-pay | Admitting: Cardiovascular Disease

## 2019-05-27 ENCOUNTER — Encounter: Payer: Self-pay | Admitting: Cardiology

## 2019-05-27 ENCOUNTER — Telehealth: Payer: Self-pay

## 2019-05-27 ENCOUNTER — Telehealth (INDEPENDENT_AMBULATORY_CARE_PROVIDER_SITE_OTHER): Payer: Medicare Other | Admitting: General Practice

## 2019-05-27 ENCOUNTER — Other Ambulatory Visit: Payer: Self-pay

## 2019-05-27 VITALS — BP 141/84 | HR 79 | Ht 71.0 in | Wt 195.0 lb

## 2019-05-27 DIAGNOSIS — J398 Other specified diseases of upper respiratory tract: Secondary | ICD-10-CM

## 2019-05-27 DIAGNOSIS — I4891 Unspecified atrial fibrillation: Secondary | ICD-10-CM

## 2019-05-27 DIAGNOSIS — J988 Other specified respiratory disorders: Secondary | ICD-10-CM

## 2019-05-27 DIAGNOSIS — I110 Hypertensive heart disease with heart failure: Secondary | ICD-10-CM

## 2019-05-27 DIAGNOSIS — I5042 Chronic combined systolic (congestive) and diastolic (congestive) heart failure: Secondary | ICD-10-CM | POA: Diagnosis not present

## 2019-05-27 NOTE — Patient Instructions (Addendum)
Medication Instructions:  Take Guaifenesin (Mucinex NO DM) over the counter to help withy chest congestion. If you need a refill on your cardiac medications before your next appointment, please call your pharmacy.   Lab work: None  If you have labs (blood work) drawn today and your tests are completely normal, you will receive your results only by: Marland Kitchen MyChart Message (if you have MyChart) OR . A paper copy in the mail If you have any lab test that is abnormal or we need to change your treatment, we will call you to review the results.  Testing/Procedures: None   Follow-Up: At [facility], you and your health needs are our priority.  As part of our continuing mission to provide you with exceptional heart care, we have created designated Provider Care Teams.  These Care Teams include your primary Cardiologist (physician) and Advanced Practice Providers (APPs -  Physician Assistants and Nurse Practitioners) who all work together to provide you with the care you need, when you need it.  . Your physician recommends that you schedule a follow-up appointment in: 3 month with Julian Memos, NP or Available APP  Any Other Special Instructions Will Be Listed Below (If Applicable). CONTINUE TO MONITOR YOUR BLOOD PRESSURE REQUESTED LABS FROM PCP

## 2019-05-27 NOTE — Telephone Encounter (Signed)
Called patient to discuss AVS instructions. Julian Ross's recommendations and voiced understanding. AVS summary mailed to patient.

## 2019-05-27 NOTE — Telephone Encounter (Signed)
Virtual Visit Pre-Appointment Phone Call  "(Julian Ross), I am calling you today to discuss your upcoming appointment. We are currently trying to limit exposure to the virus that causes COVID-19 by seeing patients at home rather than in the office."  1. "What is the BEST phone number to call the day of the visit?" - [phone]  2. "Do you have or have access to (through a family member/friend) a smartphone with video capability that we can use for your visit?" a. If yes - list this number in appt notes as "cell" (if different from BEST phone #) and list the appointment type as a VIDEO visit in appointment notes  3. Confirm consent - "In the setting of the current Covid19 crisis, you are scheduled for a ( video) visit with your provider on (May 27) at (3:00pm).  Just as we do with many in-office visits, in order for you to participate in this visit, we must obtain consent.  If you'd like, I can send this to your mychart (if signed up) or email for you to review.  Otherwise, I can obtain your verbal consent now.  All virtual visits are billed to your insurance company just like a normal visit would be.  By agreeing to a virtual visit, we'd like you to understand that the technology does not allow for your provider to perform an examination, and thus may limit your provider's ability to fully assess your condition. If your provider identifies any concerns that need to be evaluated in person, we will make arrangements to do so.  Finally, though the technology is pretty good, we cannot assure that it will always work on either your or our end, and in the setting of a video visit, we may have to convert it to a phone-only visit.  In either situation, we cannot ensure that we have a secure connection.  Are you willing to proceed?" STAFF: Did the patient verbally acknowledge consent to telehealth visit? Document YES/NO here: YES  4. Advise patient to be prepared - "Two hours prior to your appointment, go  ahead and check your blood pressure, pulse, oxygen saturation, and your weight (if you have the equipment to check those) and write them all down. When your visit starts, your provider will ask you for this information. If you have an Apple Watch or Kardia device, please plan to have heart rate information ready on the day of your appointment. Please have a pen and paper handy nearby the day of the visit as well."  5. Give patient instructions for MyChart download to smartphone OR Doximity/Doxy.me as below if video visit (depending on what platform provider is using)  6. Inform patient they will receive a phone call 15 minutes prior to their appointment time (may be from unknown caller ID) so they should be prepared to answer    TELEPHONE CALL NOTE  Julian Ross has been deemed a candidate for a follow-up tele-health visit to limit community exposure during the Covid-19 pandemic. I spoke with the patient via phone to ensure availability of phone/video source, confirm preferred email & phone number, and discuss instructions and expectations.  I reminded Julian Ross to be prepared with any vital sign and/or heart rhythm information that could potentially be obtained via home monitoring, at the time of his visit. I reminded Julian Ross to expect a phone call prior to his visit.  Julian Ross, [place] 05/27/2019 1:24 PM   INSTRUCTIONS FOR DOWNLOADING THE MYCHART APP TO SMARTPHONE  -  The patient must first make sure to have activated MyChart and know their login information - If Apple, go to [employer] and type in MyChart in the search bar and download the app. If Android, ask patient to go to [employer] and type in [place] in the search bar and download the app. The app is free but as with any other app downloads, their phone may require them to verify saved payment information or Apple/Android password.  - The patient will need to then log into the app with their MyChart username and password,  and select [facility] as their healthcare provider to link the account. When it is time for your visit, go to the MyChart app, find appointments, and click Begin Video Visit. Be sure to Select Allow for your device to access the Microphone and Camera for your visit. You will then be connected, and your provider will be with you shortly.  **If they have any issues connecting, or need assistance please contact MyChart service desk [phone])**  **If using a computer, in order to ensure the best quality for their visit they will need to use either of the following Internet Browsers: [employer], or Google Chrome**  IF USING DOXIMITY or DOXY.ME - The patient will receive a link just prior to their visit by text.     FULL LENGTH CONSENT FOR TELE-HEALTH VISIT   I hereby voluntarily request, consent and authorize [facility] and its employed or contracted physicians, physician assistants, nurse practitioners or other licensed health care professionals (the Practitioner), to provide me with telemedicine health care services (the "Services") as deemed necessary by the treating Practitioner. I acknowledge and consent to receive the Services by the Practitioner via telemedicine. I understand that the telemedicine visit will involve communicating with the Practitioner through live audiovisual communication technology and the disclosure of certain medical information by electronic transmission. I acknowledge that I have been given the opportunity to request an in-person assessment or other available alternative prior to the telemedicine visit and am voluntarily participating in the telemedicine visit.  I understand that I have the right to withhold or withdraw my consent to the use of telemedicine in the course of my care at any time, without affecting my right to future care or treatment, and that the Practitioner or I may terminate the telemedicine visit at any time. I understand that I  have the right to inspect all information obtained and/or recorded in the course of the telemedicine visit and may receive copies of available information for a reasonable fee.  I understand that some of the potential risks of receiving the Services via telemedicine include:  Marland Kitchen Delay or interruption in medical evaluation due to technological equipment failure or disruption; . Information transmitted may not be sufficient (e.g. poor resolution of images) to allow for appropriate medical decision making by the Practitioner; and/or  . In rare instances, security protocols could fail, causing a breach of personal health information.  Furthermore, I acknowledge that it is my responsibility to provide information about my medical history, conditions and care that is complete and accurate to the best of my ability. I acknowledge that Practitioner's advice, recommendations, and/or decision may be based on factors not within their control, such as incomplete or inaccurate data provided by me or distortions of diagnostic images or specimens that may result from electronic transmissions. I understand that the practice of medicine is not an exact science and that Practitioner makes no warranties or guarantees regarding treatment  outcomes. I acknowledge that I will receive a copy of this consent concurrently upon execution via email to the email address I last provided but may also request a printed copy by calling the office of [facility].    I understand that my insurance will be billed for this visit.   I have read or had this consent read to me. . I understand the contents of this consent, which adequately explains the benefits and risks of the Services being provided via telemedicine.  . I have been provided ample opportunity to ask questions regarding this consent and the Services and have had my questions answered to my satisfaction. . I give my informed consent for the services to be provided through the  use of telemedicine in my medical care  By participating in this telemedicine visit I agree to the above.

## 2019-05-27 NOTE — Telephone Encounter (Signed)
Called patient to get virtual consent before his 3:00 visit with Kerin Ransom. Patient mailbox was full, so I was unable to leave a message.

## 2019-05-27 NOTE — Progress Notes (Addendum)
Virtual Visit via Video Note   This visit type was conducted due to national recommendations for restrictions regarding the COVID-19 Pandemic (e.g. social distancing) in an effort to limit this patient's exposure and mitigate transmission in our community.  Due to his co-morbid illnesses, this patient is at least at moderate risk for complications without adequate follow up.  This format is felt to be most appropriate for this patient at this time.  All issues noted in this document were discussed and addressed.  A limited physical exam was performed with this format.  Please refer to the patient's chart for his consent to telehealth for [facility].  Evaluation Performed:  Follow-up visit  This visit type was conducted due to national recommendations for restrictions regarding the COVID-19 Pandemic (e.g. social distancing).  This format is felt to be most appropriate for this patient at this time.  All issues noted in this document were discussed and addressed.  No physical exam was performed (except for noted visual exam findings with Video Visits).  Please refer to the patient's chart (MyChart message for video visits and phone note for telephone visits) for the patient's consent to telehealth for [facility].  Due to technical difficulties visit was started as telehealth video visit and changed to telephone visit.  Date:  05/27/2019   ID:  Julian Ross, DOB 1946-05-29, MRN [id number]  Patient Location:  [place] [address]   Provider location:   [address] [place], [place] [address]  PCP:  Reynold Bowen, MD  Cardiologist:  Skeet Latch, MD  Electrophysiologist:  None   Chief Complaint:  Leg Swelling  History of Present Illness:    Julian Ross is a 73 y.o. male who presents via audio/video conferencing for a telehealth visit today.  Patient verified DOB and address.  Dr. Bradly Chris PMH includes chronic systolic and diastolic heart  failure LVEF, echocardiogram 11/04/17 EF 45 to 50% with inferolateral and basal to mid inferior walls.  Paroxysmal atrial fibrillation, coronary artery disease S/P multiple PCI's, 16 stents placed in [place] statin intolerance who was last seen by Dr. [name] on 01/09/2019. At that time she recommended continuing his current medication regimen. On 05/06/2019 via phone Dr. [name] prescribed lasix 40 mg daily and PRN for the patients LEE.  Patient seen today 05/27/2019 for follow-up. His LEE has improved however, he still endorses mild to moderate left lower extremity edema.  He states that he has been taking Lasix 40 mg daily.  He also endorses CMP labs being drawn at [facility].  We will look for these results.  Patient notes congestion for the last several days and states he would like medicine to help recover from the congestion.  He denies shortness of breath, chest pain, orthopnea, dizziness, palpitations and PND.    The patient does not have symptoms concerning for COVID-19 infection (fever, chills, cough, or new SHORTNESS OF BREATH).  He endorses staying quarantined at his primary residence.  He does not go out and has regular help at home.   Prior CV studies:   The following studies were reviewed today:   Echo 11/04/17: LVEF 45-50%.  Wall motion unchanged from 07/20/16.  PASP 39 mmHg.   LHC 10/03/16: Ost Cx to Dist Cx lesion, 65 %stenosed.  2nd Mrg lesion, 75 %stenosed.  Dist Cx lesion, 80 %stenosed.  Dist LAD lesion, 90 %stenosed.  Mid LAD lesion, 80 %stenosed.  Prox LAD to Mid LAD lesion, 70 %stenosed.   1st Diag lesion,  75 %stenosed.  1st RPLB lesion, 100 %stenosed.  Prox RCA to Dist RCA lesion, 100 %stenosed.  LV end diastolic pressure is mildly elevated.   Severe diffuse three-vessel coronary disease in this patient with multiple prior stents.  Total occlusion of the right coronary within the ostial segment. Distal vessel fills by collaterals from  the left coronary. The distal right coronary is small and may not be graftable.  Severe diffuse LAD disease with 70% proximal stenosis, 80% mid stenosis, and 90% apical stenosis. The first diagonal which is relatively small contains segmental 70-80% stenosis.  Heavily stented proximal to mid circumflex coronary artery with 50-70% in-stent restenosis, diffuse. Large second obtuse marginal with 75% proximal diffuse narrowing. 70-80% segmental narrowing in the mid circumflex after the origin of the second obtuse marginal.  Mildly elevated LV EDP. Recent echo demonstrating EF greater than 50%. Contrast LV gram not performed due to chronic kidney disease. Total contrast used was 80 cc.  Past Medical History:  Diagnosis Date   Anemia    Anxiety state    CAD (coronary artery disease)    69 prior stents (at [facility])   Cardiomyopathy, ischemic    Chronic lower back pain    Chronic systolic CHF (congestive heart failure) (HCC)    CKD (chronic kidney disease), stage III (HCC)    Depression    Diabetes mellitus type 2 in obese (HCC)    Fibromyalgia    Hepatitis A    at age 17   High cholesterol    History of blood transfusion    "related to OR"   Hypertension    Hypokalemia    MI (myocardial infarction) ([facility]) 1990; 1995; 1997   OSA on CPAP    setting = 13, full face mask   Osteoarthritis    Persistent atrial fibrillation 02/2016   on Eliquis   Rheumatoid arthritis of multiple sites with negative rheumatoid factor ([facility])    Septic shock ([facility]) 02/2016   in setting of severe pneumonia   Past Surgical History:  Procedure Laterality Date   BACK SURGERY     CARDIAC CATHETERIZATION N/A 10/03/2016   Procedure: Left Heart Cath and Coronary Angiography;  Surgeon: Belva Crome, MD;  Location: [facility];  Service: Cardiovascular;  Laterality: N/A;   CARDIOVERSION N/A 06/04/2016   Procedure: CARDIOVERSION;  Surgeon: Skeet Latch, MD;  Location: [facility];  Service: Cardiovascular;  Laterality: N/A;   CATARACT EXTRACTION W/ INTRAOCULAR LENS  IMPLANT, BILATERAL Bilateral 2000s   CORONARY ANGIOPLASTY  early 1990s   CORONARY ANGIOPLASTY WITH STENT PLACEMENT     "I've got 15 stents" (07/19/2016)   KNEE ARTHROSCOPY Left 1990s   LEFT HEART CATH AND CORONARY ANGIOGRAPHY N/A 11/15/2017   Procedure: LEFT HEART CATH AND CORONARY ANGIOGRAPHY;  Surgeon: Jettie Booze, MD;  Location: [facility];  Service: Cardiovascular;  Laterality: N/A;   NASAL SINUS SURGERY     POSTERIOR LUMBAR FUSION  2015   L4-5-S1   right knee surgery     age of 45   [place]     No outpatient medications have been marked as taking for the 05/27/19 encounter (Appointment) with Erlene Quan, PA-C.     Allergies:   Statins; Ace inhibitors; Contrast media [iodinated diagnostic agents]; and Penicillins   Social History   Tobacco Use   Smoking status: Never Smoker   Smokeless tobacco: Never Used  Substance Use Topics   Alcohol use: No  Drug use: No     Family Hx: The patient's family history includes Heart attack in his brother and father; Heart failure in his mother; Hyperlipidemia in his mother; Hypertension in his mother.  ROS:   Please see the history of present illness.     All other systems reviewed and are negative.   Labs/Other Tests and Data Reviewed:    Recent Labs: 06/01/2018: Magnesium 1.8 07/02/2018: ALT 16 07/04/2018: BUN 18; Creatinine, Ser 2.01; Hemoglobin 11.0; Platelets 331; Potassium 4.3; Sodium 139   Recent Lipid Panel Lab Results  Component Value Date/Time   CHOL 98 05/29/2018 10:04 AM   CHOL 167 05/05/2018 09:39 AM   TRIG 87 05/29/2018 10:04 AM   HDL 17 (L) 05/29/2018 10:04 AM   HDL 46 05/05/2018 09:39 AM   CHOLHDL 5.8 05/29/2018 10:04 AM   LDLCALC 64 05/29/2018 10:04 AM   LDLCALC 93 05/05/2018 09:39 AM    Wt Readings from Last 3 Encounters:  01/09/19 194 lb 9.6 oz  (88.3 kg)  07/02/18 200 lb (90.7 kg)  05/28/18 202 lb (91.6 kg)     Exam:    Vital Signs:  There were no vitals taken for this visit.   Well nourished, well developed male in no  acute distress.   ASSESSMENT & PLAN:    1.   Chronic systolic and diastolic heart failure:   Reviewed echocardiogram 11/04/2017, LVEF slightly declined to 45-50% from 50-55% previously.   Continue carvedilol and Imdur.   Not on  an ARB due to 2/2 CKD.   2. Atrial fibrillation:  Heart rate  well-controlled  with carvedilol.  Continue Eliquis and carvedilol.   CHA2DS2-VASc Score and unadjusted Ischemic Stroke Rate (% per year) is equal to 7.2 % stroke rate/year from a score of 5  3.Hypertension/hypotension:  BP slightly elevated.  Continue to monitor at home.  4.  Chest congestion Patient instructed to obtain guaifenesin and take as directed.   We will obtain most recent lab work for evaluation.  Labs were drawn on 05/07/2019 at [facility].  COVID-19 Education: The signs and symptoms of COVID-19 were discussed with the patient and how to seek care for testing (follow up with PCP or arrange E-visit).  The importance of social distancing was discussed today.  Patient Risk:   After full review of this patients clinical status, I feel that they are at least moderate risk at this time.  Time:   Today, I have spent 10 minutes with the patient with telehealth technology discussing CHF, chest congestion, daily weights.     Medication Adjustments/Labs and Tests Ordered: Current medicines are reviewed at length with the patient today.  Concerns regarding medicines are outlined above.   Tests Ordered: No orders of the defined types were placed in this encounter.  Medication Changes: No orders of the defined types were placed in this encounter.   Disposition: Follow up  in 3 month(s) with APP  Signed, Deberah Pelton, NP  05/27/2019 1:23 PM    [facility]

## 2019-06-15 ENCOUNTER — Other Ambulatory Visit: Payer: Self-pay

## 2019-06-15 DIAGNOSIS — S332XXA Dislocation of sacroiliac and sacrococcygeal joint, initial encounter: Secondary | ICD-10-CM | POA: Diagnosis not present

## 2019-06-15 DIAGNOSIS — M9904 Segmental and somatic dysfunction of sacral region: Secondary | ICD-10-CM | POA: Diagnosis not present

## 2019-06-15 DIAGNOSIS — S13160A Subluxation of C5/C6 cervical vertebrae, initial encounter: Secondary | ICD-10-CM | POA: Diagnosis not present

## 2019-06-15 DIAGNOSIS — M9901 Segmental and somatic dysfunction of cervical region: Secondary | ICD-10-CM | POA: Diagnosis not present

## 2019-06-15 MED ORDER — CARVEDILOL 12.5 MG PO TABS: 12.5000 mg | ORAL_TABLET | Freq: Two times a day (BID) | ORAL | 1 refills | Status: AC

## 2019-06-22 ENCOUNTER — Other Ambulatory Visit: Payer: Self-pay | Admitting: Cardiovascular Disease

## 2019-06-26 DIAGNOSIS — G9389 Other specified disorders of brain: Secondary | ICD-10-CM | POA: Diagnosis not present

## 2019-06-26 DIAGNOSIS — R262 Difficulty in walking, not elsewhere classified: Secondary | ICD-10-CM | POA: Diagnosis not present

## 2019-06-26 DIAGNOSIS — Z9181 History of falling: Secondary | ICD-10-CM | POA: Diagnosis not present

## 2019-06-26 DIAGNOSIS — D62 Acute posthemorrhagic anemia: Secondary | ICD-10-CM | POA: Diagnosis present

## 2019-06-26 DIAGNOSIS — R0602 Shortness of breath: Secondary | ICD-10-CM | POA: Diagnosis not present

## 2019-06-26 DIAGNOSIS — N183 Chronic kidney disease, stage 3 (moderate): Secondary | ICD-10-CM | POA: Diagnosis not present

## 2019-06-26 DIAGNOSIS — Z515 Encounter for palliative care: Secondary | ICD-10-CM | POA: Diagnosis present

## 2019-06-26 DIAGNOSIS — R1312 Dysphagia, oropharyngeal phase: Secondary | ICD-10-CM | POA: Diagnosis not present

## 2019-06-26 DIAGNOSIS — Z5309 Procedure and treatment not carried out because of other contraindication: Secondary | ICD-10-CM | POA: Diagnosis not present

## 2019-06-26 DIAGNOSIS — I509 Heart failure, unspecified: Secondary | ICD-10-CM | POA: Diagnosis not present

## 2019-06-26 DIAGNOSIS — S199XXA Unspecified injury of neck, initial encounter: Secondary | ICD-10-CM | POA: Diagnosis not present

## 2019-06-26 DIAGNOSIS — I4892 Unspecified atrial flutter: Secondary | ICD-10-CM | POA: Diagnosis present

## 2019-06-26 DIAGNOSIS — R51 Headache: Secondary | ICD-10-CM | POA: Diagnosis not present

## 2019-06-26 DIAGNOSIS — R918 Other nonspecific abnormal finding of lung field: Secondary | ICD-10-CM | POA: Diagnosis not present

## 2019-06-26 DIAGNOSIS — R531 Weakness: Secondary | ICD-10-CM | POA: Diagnosis not present

## 2019-06-26 DIAGNOSIS — R4182 Altered mental status, unspecified: Secondary | ICD-10-CM | POA: Diagnosis not present

## 2019-06-26 DIAGNOSIS — R41841 Cognitive communication deficit: Secondary | ICD-10-CM | POA: Diagnosis not present

## 2019-06-26 DIAGNOSIS — Z743 Need for continuous supervision: Secondary | ICD-10-CM | POA: Diagnosis not present

## 2019-06-26 DIAGNOSIS — R600 Localized edema: Secondary | ICD-10-CM | POA: Diagnosis not present

## 2019-06-26 DIAGNOSIS — S3991XA Unspecified injury of abdomen, initial encounter: Secondary | ICD-10-CM | POA: Diagnosis not present

## 2019-06-26 DIAGNOSIS — G9341 Metabolic encephalopathy: Secondary | ICD-10-CM | POA: Diagnosis present

## 2019-06-26 DIAGNOSIS — R410 Disorientation, unspecified: Secondary | ICD-10-CM | POA: Diagnosis not present

## 2019-06-26 DIAGNOSIS — I6523 Occlusion and stenosis of bilateral carotid arteries: Secondary | ICD-10-CM | POA: Diagnosis not present

## 2019-06-26 DIAGNOSIS — Z7982 Long term (current) use of aspirin: Secondary | ICD-10-CM | POA: Diagnosis not present

## 2019-06-26 DIAGNOSIS — Z66 Do not resuscitate: Secondary | ICD-10-CM | POA: Diagnosis not present

## 2019-06-26 DIAGNOSIS — S299XXA Unspecified injury of thorax, initial encounter: Secondary | ICD-10-CM | POA: Diagnosis not present

## 2019-06-26 DIAGNOSIS — I130 Hypertensive heart and chronic kidney disease with heart failure and stage 1 through stage 4 chronic kidney disease, or unspecified chronic kidney disease: Secondary | ICD-10-CM | POA: Diagnosis not present

## 2019-06-26 DIAGNOSIS — R4701 Aphasia: Secondary | ICD-10-CM | POA: Diagnosis present

## 2019-06-26 DIAGNOSIS — S0990XA Unspecified injury of head, initial encounter: Secondary | ICD-10-CM | POA: Diagnosis not present

## 2019-06-26 DIAGNOSIS — Z794 Long term (current) use of insulin: Secondary | ICD-10-CM | POA: Diagnosis not present

## 2019-06-26 DIAGNOSIS — I110 Hypertensive heart disease with heart failure: Secondary | ICD-10-CM | POA: Diagnosis present

## 2019-06-26 DIAGNOSIS — I4891 Unspecified atrial fibrillation: Secondary | ICD-10-CM | POA: Diagnosis not present

## 2019-06-26 DIAGNOSIS — I2510 Atherosclerotic heart disease of native coronary artery without angina pectoris: Secondary | ICD-10-CM | POA: Diagnosis present

## 2019-06-26 DIAGNOSIS — Z8701 Personal history of pneumonia (recurrent): Secondary | ICD-10-CM | POA: Diagnosis not present

## 2019-06-26 DIAGNOSIS — I5023 Acute on chronic systolic (congestive) heart failure: Secondary | ICD-10-CM | POA: Diagnosis not present

## 2019-06-26 DIAGNOSIS — R05 Cough: Secondary | ICD-10-CM | POA: Diagnosis not present

## 2019-06-26 DIAGNOSIS — Z7901 Long term (current) use of anticoagulants: Secondary | ICD-10-CM | POA: Diagnosis not present

## 2019-06-26 DIAGNOSIS — M25552 Pain in left hip: Secondary | ICD-10-CM | POA: Diagnosis not present

## 2019-06-26 DIAGNOSIS — M6281 Muscle weakness (generalized): Secondary | ICD-10-CM | POA: Diagnosis not present

## 2019-06-26 DIAGNOSIS — I5022 Chronic systolic (congestive) heart failure: Secondary | ICD-10-CM | POA: Diagnosis present

## 2019-06-26 DIAGNOSIS — I482 Chronic atrial fibrillation, unspecified: Secondary | ICD-10-CM | POA: Diagnosis not present

## 2019-06-26 DIAGNOSIS — K7200 Acute and subacute hepatic failure without coma: Secondary | ICD-10-CM | POA: Diagnosis present

## 2019-06-26 DIAGNOSIS — F05 Delirium due to known physiological condition: Secondary | ICD-10-CM | POA: Diagnosis present

## 2019-06-26 DIAGNOSIS — I69928 Other speech and language deficits following unspecified cerebrovascular disease: Secondary | ICD-10-CM | POA: Diagnosis not present

## 2019-06-26 DIAGNOSIS — D689 Coagulation defect, unspecified: Secondary | ICD-10-CM | POA: Diagnosis present

## 2019-06-26 DIAGNOSIS — R29818 Other symptoms and signs involving the nervous system: Secondary | ICD-10-CM | POA: Diagnosis not present

## 2019-06-26 DIAGNOSIS — D649 Anemia, unspecified: Secondary | ICD-10-CM | POA: Diagnosis not present

## 2019-06-26 DIAGNOSIS — E119 Type 2 diabetes mellitus without complications: Secondary | ICD-10-CM | POA: Diagnosis present

## 2019-06-26 DIAGNOSIS — Z20828 Contact with and (suspected) exposure to other viral communicable diseases: Secondary | ICD-10-CM | POA: Diagnosis present

## 2019-06-26 DIAGNOSIS — S8992XA Unspecified injury of left lower leg, initial encounter: Secondary | ICD-10-CM | POA: Diagnosis not present

## 2019-06-26 DIAGNOSIS — I255 Ischemic cardiomyopathy: Secondary | ICD-10-CM | POA: Diagnosis present

## 2019-06-26 DIAGNOSIS — I480 Paroxysmal atrial fibrillation: Secondary | ICD-10-CM | POA: Diagnosis present

## 2019-06-26 DIAGNOSIS — R2990 Unspecified symptoms and signs involving the nervous system: Secondary | ICD-10-CM | POA: Diagnosis not present

## 2019-06-26 DIAGNOSIS — A419 Sepsis, unspecified organism: Secondary | ICD-10-CM | POA: Diagnosis not present

## 2019-06-26 DIAGNOSIS — R945 Abnormal results of liver function studies: Secondary | ICD-10-CM | POA: Diagnosis not present

## 2019-06-26 DIAGNOSIS — N179 Acute kidney failure, unspecified: Secondary | ICD-10-CM | POA: Diagnosis present

## 2019-06-26 DIAGNOSIS — E1122 Type 2 diabetes mellitus with diabetic chronic kidney disease: Secondary | ICD-10-CM | POA: Diagnosis present

## 2019-06-26 DIAGNOSIS — I252 Old myocardial infarction: Secondary | ICD-10-CM | POA: Diagnosis not present

## 2019-06-26 DIAGNOSIS — E8809 Other disorders of plasma-protein metabolism, not elsewhere classified: Secondary | ICD-10-CM | POA: Diagnosis present

## 2019-06-26 DIAGNOSIS — F0390 Unspecified dementia without behavioral disturbance: Secondary | ICD-10-CM | POA: Diagnosis present

## 2019-06-26 DIAGNOSIS — J189 Pneumonia, unspecified organism: Secondary | ICD-10-CM | POA: Diagnosis not present

## 2019-07-09 ENCOUNTER — Telehealth: Payer: Self-pay

## 2019-07-09 NOTE — Telephone Encounter (Addendum)
Attempted to reach patient multiple times but his phone goes straight to voicemail. Received notification that as of 06/11/2019 patient has not read mychart message. Attempted to call patient again today no answer and voicemail box is full.

## 2019-07-10 ENCOUNTER — Telehealth: Payer: Self-pay | Admitting: Cardiovascular Disease

## 2019-07-10 DIAGNOSIS — I480 Paroxysmal atrial fibrillation: Secondary | ICD-10-CM | POA: Diagnosis not present

## 2019-07-10 DIAGNOSIS — R601 Generalized edema: Secondary | ICD-10-CM | POA: Diagnosis not present

## 2019-07-10 DIAGNOSIS — S0083XD Contusion of other part of head, subsequent encounter: Secondary | ICD-10-CM | POA: Diagnosis not present

## 2019-07-10 DIAGNOSIS — N179 Acute kidney failure, unspecified: Secondary | ICD-10-CM | POA: Diagnosis not present

## 2019-07-10 DIAGNOSIS — R600 Localized edema: Secondary | ICD-10-CM | POA: Diagnosis not present

## 2019-07-10 DIAGNOSIS — I482 Chronic atrial fibrillation, unspecified: Secondary | ICD-10-CM | POA: Diagnosis not present

## 2019-07-10 DIAGNOSIS — S0093XD Contusion of unspecified part of head, subsequent encounter: Secondary | ICD-10-CM | POA: Diagnosis not present

## 2019-07-10 DIAGNOSIS — Z23 Encounter for immunization: Secondary | ICD-10-CM | POA: Diagnosis not present

## 2019-07-10 DIAGNOSIS — R41841 Cognitive communication deficit: Secondary | ICD-10-CM | POA: Diagnosis not present

## 2019-07-10 DIAGNOSIS — F0390 Unspecified dementia without behavioral disturbance: Secondary | ICD-10-CM | POA: Diagnosis not present

## 2019-07-10 DIAGNOSIS — I5022 Chronic systolic (congestive) heart failure: Secondary | ICD-10-CM | POA: Diagnosis not present

## 2019-07-10 DIAGNOSIS — R262 Difficulty in walking, not elsewhere classified: Secondary | ICD-10-CM | POA: Diagnosis not present

## 2019-07-10 DIAGNOSIS — R1312 Dysphagia, oropharyngeal phase: Secondary | ICD-10-CM | POA: Diagnosis not present

## 2019-07-10 DIAGNOSIS — I130 Hypertensive heart and chronic kidney disease with heart failure and stage 1 through stage 4 chronic kidney disease, or unspecified chronic kidney disease: Secondary | ICD-10-CM | POA: Diagnosis not present

## 2019-07-10 DIAGNOSIS — G9341 Metabolic encephalopathy: Secondary | ICD-10-CM | POA: Diagnosis not present

## 2019-07-10 DIAGNOSIS — F05 Delirium due to known physiological condition: Secondary | ICD-10-CM | POA: Diagnosis not present

## 2019-07-10 DIAGNOSIS — S300XXD Contusion of lower back and pelvis, subsequent encounter: Secondary | ICD-10-CM | POA: Diagnosis not present

## 2019-07-10 DIAGNOSIS — M6281 Muscle weakness (generalized): Secondary | ICD-10-CM | POA: Diagnosis not present

## 2019-07-10 DIAGNOSIS — I252 Old myocardial infarction: Secondary | ICD-10-CM | POA: Diagnosis not present

## 2019-07-10 DIAGNOSIS — D62 Acute posthemorrhagic anemia: Secondary | ICD-10-CM | POA: Diagnosis not present

## 2019-07-10 DIAGNOSIS — D500 Iron deficiency anemia secondary to blood loss (chronic): Secondary | ICD-10-CM | POA: Diagnosis not present

## 2019-07-10 DIAGNOSIS — Z7901 Long term (current) use of anticoagulants: Secondary | ICD-10-CM | POA: Diagnosis not present

## 2019-07-10 DIAGNOSIS — I69928 Other speech and language deficits following unspecified cerebrovascular disease: Secondary | ICD-10-CM | POA: Diagnosis not present

## 2019-07-10 DIAGNOSIS — N183 Chronic kidney disease, stage 3 (moderate): Secondary | ICD-10-CM | POA: Diagnosis not present

## 2019-07-10 DIAGNOSIS — Z794 Long term (current) use of insulin: Secondary | ICD-10-CM | POA: Diagnosis not present

## 2019-07-10 DIAGNOSIS — E1122 Type 2 diabetes mellitus with diabetic chronic kidney disease: Secondary | ICD-10-CM | POA: Diagnosis not present

## 2019-07-10 DIAGNOSIS — R339 Retention of urine, unspecified: Secondary | ICD-10-CM | POA: Diagnosis not present

## 2019-07-10 DIAGNOSIS — Z7982 Long term (current) use of aspirin: Secondary | ICD-10-CM | POA: Diagnosis not present

## 2019-07-10 DIAGNOSIS — D649 Anemia, unspecified: Secondary | ICD-10-CM | POA: Diagnosis not present

## 2019-07-10 DIAGNOSIS — J189 Pneumonia, unspecified organism: Secondary | ICD-10-CM | POA: Diagnosis not present

## 2019-07-10 DIAGNOSIS — Z9181 History of falling: Secondary | ICD-10-CM | POA: Diagnosis not present

## 2019-07-10 DIAGNOSIS — I509 Heart failure, unspecified: Secondary | ICD-10-CM | POA: Diagnosis not present

## 2019-07-10 NOTE — Telephone Encounter (Signed)
Will forward to Pharm D and DOD Dr Margaretann Loveless for review

## 2019-07-10 NOTE — Telephone Encounter (Signed)
Left message to call back  

## 2019-07-10 NOTE — Telephone Encounter (Signed)
MD will need to address - agree Ranexa cannot be crushed.

## 2019-07-10 NOTE — Telephone Encounter (Signed)
Ok to hold ranexa since it cannot be crushed.   Please arrange virtual follow up with patient with APP next available to review current health and medications. Last saw [place] [facility].

## 2019-07-10 NOTE — Telephone Encounter (Signed)
°  Zandra Abts, LPN from [facility] was calling with med suggestions for the patient. The patient was at [facility], but is getting transferred to [facility] for [place].   The patient's status has declined during his hospital stay, and many of his medications have had to be crushed for the patient to take. The nurses at [facility] have not been dispensing Ranexa because the medication can not be crushed.  The LPN would like to know if there was a replacement medication that the patient could be on in the interim, or if the [facility] facility can continue to not give the medication to the patient.  You can reach out to [facility] directly at the number provided, or you can fax orders to the facility at [phone]

## 2019-07-13 NOTE — Addendum Note (Signed)
Addended by: Alvina Filbert B on: 07/13/2019 02:14 PM   Modules accepted: Orders

## 2019-07-13 NOTE — Telephone Encounter (Signed)
Julian Ross, verbalized understanding

## 2019-07-14 DIAGNOSIS — R601 Generalized edema: Secondary | ICD-10-CM | POA: Diagnosis not present

## 2019-07-14 DIAGNOSIS — R339 Retention of urine, unspecified: Secondary | ICD-10-CM | POA: Diagnosis not present

## 2019-07-14 DIAGNOSIS — D500 Iron deficiency anemia secondary to blood loss (chronic): Secondary | ICD-10-CM | POA: Diagnosis not present

## 2019-07-14 DIAGNOSIS — J189 Pneumonia, unspecified organism: Secondary | ICD-10-CM | POA: Diagnosis not present

## 2019-07-15 DIAGNOSIS — I482 Chronic atrial fibrillation, unspecified: Secondary | ICD-10-CM | POA: Diagnosis not present

## 2019-07-15 DIAGNOSIS — S300XXD Contusion of lower back and pelvis, subsequent encounter: Secondary | ICD-10-CM | POA: Diagnosis not present

## 2019-07-15 DIAGNOSIS — I130 Hypertensive heart and chronic kidney disease with heart failure and stage 1 through stage 4 chronic kidney disease, or unspecified chronic kidney disease: Secondary | ICD-10-CM | POA: Diagnosis not present

## 2019-07-15 DIAGNOSIS — I5022 Chronic systolic (congestive) heart failure: Secondary | ICD-10-CM | POA: Diagnosis not present

## 2019-07-20 DIAGNOSIS — N183 Chronic kidney disease, stage 3 (moderate): Secondary | ICD-10-CM | POA: Diagnosis not present

## 2019-07-20 DIAGNOSIS — I5022 Chronic systolic (congestive) heart failure: Secondary | ICD-10-CM | POA: Diagnosis not present

## 2019-07-20 DIAGNOSIS — E1122 Type 2 diabetes mellitus with diabetic chronic kidney disease: Secondary | ICD-10-CM | POA: Diagnosis not present

## 2019-07-20 DIAGNOSIS — I482 Chronic atrial fibrillation, unspecified: Secondary | ICD-10-CM | POA: Diagnosis not present

## 2019-07-22 DIAGNOSIS — I5022 Chronic systolic (congestive) heart failure: Secondary | ICD-10-CM | POA: Diagnosis not present

## 2019-07-22 DIAGNOSIS — D62 Acute posthemorrhagic anemia: Secondary | ICD-10-CM | POA: Diagnosis not present

## 2019-07-22 DIAGNOSIS — I482 Chronic atrial fibrillation, unspecified: Secondary | ICD-10-CM | POA: Diagnosis not present

## 2019-07-22 DIAGNOSIS — E1122 Type 2 diabetes mellitus with diabetic chronic kidney disease: Secondary | ICD-10-CM | POA: Diagnosis not present

## 2019-07-28 DIAGNOSIS — M6281 Muscle weakness (generalized): Secondary | ICD-10-CM | POA: Diagnosis not present

## 2019-07-28 DIAGNOSIS — I480 Paroxysmal atrial fibrillation: Secondary | ICD-10-CM | POA: Diagnosis not present

## 2019-07-28 DIAGNOSIS — I509 Heart failure, unspecified: Secondary | ICD-10-CM | POA: Diagnosis not present

## 2019-07-28 DIAGNOSIS — R601 Generalized edema: Secondary | ICD-10-CM | POA: Diagnosis not present

## 2019-08-03 DIAGNOSIS — I482 Chronic atrial fibrillation, unspecified: Secondary | ICD-10-CM | POA: Diagnosis not present

## 2019-08-03 DIAGNOSIS — D62 Acute posthemorrhagic anemia: Secondary | ICD-10-CM | POA: Diagnosis not present

## 2019-08-03 DIAGNOSIS — I5022 Chronic systolic (congestive) heart failure: Secondary | ICD-10-CM | POA: Diagnosis not present

## 2019-08-03 DIAGNOSIS — E1122 Type 2 diabetes mellitus with diabetic chronic kidney disease: Secondary | ICD-10-CM | POA: Diagnosis not present

## 2019-08-06 DIAGNOSIS — S0093XD Contusion of unspecified part of head, subsequent encounter: Secondary | ICD-10-CM | POA: Diagnosis not present

## 2019-08-06 DIAGNOSIS — I5022 Chronic systolic (congestive) heart failure: Secondary | ICD-10-CM | POA: Diagnosis not present

## 2019-08-06 DIAGNOSIS — I482 Chronic atrial fibrillation, unspecified: Secondary | ICD-10-CM | POA: Diagnosis not present

## 2019-08-06 DIAGNOSIS — E1122 Type 2 diabetes mellitus with diabetic chronic kidney disease: Secondary | ICD-10-CM | POA: Diagnosis not present

## 2019-08-10 DIAGNOSIS — F0390 Unspecified dementia without behavioral disturbance: Secondary | ICD-10-CM | POA: Diagnosis not present

## 2019-08-10 DIAGNOSIS — I482 Chronic atrial fibrillation, unspecified: Secondary | ICD-10-CM | POA: Diagnosis not present

## 2019-08-10 DIAGNOSIS — S0083XD Contusion of other part of head, subsequent encounter: Secondary | ICD-10-CM | POA: Diagnosis not present

## 2019-08-18 DIAGNOSIS — M9904 Segmental and somatic dysfunction of sacral region: Secondary | ICD-10-CM | POA: Diagnosis not present

## 2019-08-18 DIAGNOSIS — S332XXA Dislocation of sacroiliac and sacrococcygeal joint, initial encounter: Secondary | ICD-10-CM | POA: Diagnosis not present

## 2019-08-18 DIAGNOSIS — S13160A Subluxation of C5/C6 cervical vertebrae, initial encounter: Secondary | ICD-10-CM | POA: Diagnosis not present

## 2019-08-18 DIAGNOSIS — M9901 Segmental and somatic dysfunction of cervical region: Secondary | ICD-10-CM | POA: Diagnosis not present

## 2019-08-20 DIAGNOSIS — S13160A Subluxation of C5/C6 cervical vertebrae, initial encounter: Secondary | ICD-10-CM | POA: Diagnosis not present

## 2019-08-20 DIAGNOSIS — M9901 Segmental and somatic dysfunction of cervical region: Secondary | ICD-10-CM | POA: Diagnosis not present

## 2019-08-20 DIAGNOSIS — M9904 Segmental and somatic dysfunction of sacral region: Secondary | ICD-10-CM | POA: Diagnosis not present

## 2019-08-20 DIAGNOSIS — S332XXA Dislocation of sacroiliac and sacrococcygeal joint, initial encounter: Secondary | ICD-10-CM | POA: Diagnosis not present

## 2019-08-21 DIAGNOSIS — N183 Chronic kidney disease, stage 3 (moderate): Secondary | ICD-10-CM | POA: Diagnosis not present

## 2019-08-21 DIAGNOSIS — F0390 Unspecified dementia without behavioral disturbance: Secondary | ICD-10-CM | POA: Diagnosis not present

## 2019-08-21 DIAGNOSIS — I482 Chronic atrial fibrillation, unspecified: Secondary | ICD-10-CM | POA: Diagnosis not present

## 2019-08-21 DIAGNOSIS — M069 Rheumatoid arthritis, unspecified: Secondary | ICD-10-CM | POA: Diagnosis not present

## 2019-08-21 DIAGNOSIS — I5022 Chronic systolic (congestive) heart failure: Secondary | ICD-10-CM | POA: Diagnosis not present

## 2019-08-21 DIAGNOSIS — I130 Hypertensive heart and chronic kidney disease with heart failure and stage 1 through stage 4 chronic kidney disease, or unspecified chronic kidney disease: Secondary | ICD-10-CM | POA: Diagnosis not present

## 2019-08-21 DIAGNOSIS — Z794 Long term (current) use of insulin: Secondary | ICD-10-CM | POA: Diagnosis not present

## 2019-08-21 DIAGNOSIS — Z7901 Long term (current) use of anticoagulants: Secondary | ICD-10-CM | POA: Diagnosis not present

## 2019-08-21 DIAGNOSIS — Z9181 History of falling: Secondary | ICD-10-CM | POA: Diagnosis not present

## 2019-08-21 DIAGNOSIS — E1122 Type 2 diabetes mellitus with diabetic chronic kidney disease: Secondary | ICD-10-CM | POA: Diagnosis not present

## 2019-08-21 DIAGNOSIS — M1991 Primary osteoarthritis, unspecified site: Secondary | ICD-10-CM | POA: Diagnosis not present

## 2019-08-21 DIAGNOSIS — I2510 Atherosclerotic heart disease of native coronary artery without angina pectoris: Secondary | ICD-10-CM | POA: Diagnosis not present

## 2019-08-21 DIAGNOSIS — J188 Other pneumonia, unspecified organism: Secondary | ICD-10-CM | POA: Diagnosis not present

## 2019-08-24 DIAGNOSIS — E1122 Type 2 diabetes mellitus with diabetic chronic kidney disease: Secondary | ICD-10-CM | POA: Diagnosis not present

## 2019-08-24 DIAGNOSIS — I482 Chronic atrial fibrillation, unspecified: Secondary | ICD-10-CM | POA: Diagnosis not present

## 2019-08-24 DIAGNOSIS — I130 Hypertensive heart and chronic kidney disease with heart failure and stage 1 through stage 4 chronic kidney disease, or unspecified chronic kidney disease: Secondary | ICD-10-CM | POA: Diagnosis not present

## 2019-08-24 DIAGNOSIS — M9901 Segmental and somatic dysfunction of cervical region: Secondary | ICD-10-CM | POA: Diagnosis not present

## 2019-08-24 DIAGNOSIS — J188 Other pneumonia, unspecified organism: Secondary | ICD-10-CM | POA: Diagnosis not present

## 2019-08-24 DIAGNOSIS — S13160A Subluxation of C5/C6 cervical vertebrae, initial encounter: Secondary | ICD-10-CM | POA: Diagnosis not present

## 2019-08-24 DIAGNOSIS — N183 Chronic kidney disease, stage 3 (moderate): Secondary | ICD-10-CM | POA: Diagnosis not present

## 2019-08-24 DIAGNOSIS — I5022 Chronic systolic (congestive) heart failure: Secondary | ICD-10-CM | POA: Diagnosis not present

## 2019-08-24 DIAGNOSIS — S332XXA Dislocation of sacroiliac and sacrococcygeal joint, initial encounter: Secondary | ICD-10-CM | POA: Diagnosis not present

## 2019-08-24 DIAGNOSIS — M9904 Segmental and somatic dysfunction of sacral region: Secondary | ICD-10-CM | POA: Diagnosis not present

## 2019-08-27 DIAGNOSIS — N183 Chronic kidney disease, stage 3 (moderate): Secondary | ICD-10-CM | POA: Diagnosis not present

## 2019-08-27 DIAGNOSIS — I482 Chronic atrial fibrillation, unspecified: Secondary | ICD-10-CM | POA: Diagnosis not present

## 2019-08-27 DIAGNOSIS — E1122 Type 2 diabetes mellitus with diabetic chronic kidney disease: Secondary | ICD-10-CM | POA: Diagnosis not present

## 2019-08-27 DIAGNOSIS — J188 Other pneumonia, unspecified organism: Secondary | ICD-10-CM | POA: Diagnosis not present

## 2019-08-27 DIAGNOSIS — I130 Hypertensive heart and chronic kidney disease with heart failure and stage 1 through stage 4 chronic kidney disease, or unspecified chronic kidney disease: Secondary | ICD-10-CM | POA: Diagnosis not present

## 2019-08-27 DIAGNOSIS — I5022 Chronic systolic (congestive) heart failure: Secondary | ICD-10-CM | POA: Diagnosis not present

## 2019-08-28 DIAGNOSIS — I130 Hypertensive heart and chronic kidney disease with heart failure and stage 1 through stage 4 chronic kidney disease, or unspecified chronic kidney disease: Secondary | ICD-10-CM | POA: Diagnosis not present

## 2019-08-28 DIAGNOSIS — J188 Other pneumonia, unspecified organism: Secondary | ICD-10-CM | POA: Diagnosis not present

## 2019-08-28 DIAGNOSIS — N183 Chronic kidney disease, stage 3 (moderate): Secondary | ICD-10-CM | POA: Diagnosis not present

## 2019-08-28 DIAGNOSIS — I482 Chronic atrial fibrillation, unspecified: Secondary | ICD-10-CM | POA: Diagnosis not present

## 2019-08-28 DIAGNOSIS — E1122 Type 2 diabetes mellitus with diabetic chronic kidney disease: Secondary | ICD-10-CM | POA: Diagnosis not present

## 2019-08-28 DIAGNOSIS — I5022 Chronic systolic (congestive) heart failure: Secondary | ICD-10-CM | POA: Diagnosis not present

## 2019-08-31 DIAGNOSIS — I482 Chronic atrial fibrillation, unspecified: Secondary | ICD-10-CM | POA: Diagnosis not present

## 2019-08-31 DIAGNOSIS — E1122 Type 2 diabetes mellitus with diabetic chronic kidney disease: Secondary | ICD-10-CM | POA: Diagnosis not present

## 2019-08-31 DIAGNOSIS — J188 Other pneumonia, unspecified organism: Secondary | ICD-10-CM | POA: Diagnosis not present

## 2019-08-31 DIAGNOSIS — N183 Chronic kidney disease, stage 3 (moderate): Secondary | ICD-10-CM | POA: Diagnosis not present

## 2019-08-31 DIAGNOSIS — I5022 Chronic systolic (congestive) heart failure: Secondary | ICD-10-CM | POA: Diagnosis not present

## 2019-08-31 DIAGNOSIS — I130 Hypertensive heart and chronic kidney disease with heart failure and stage 1 through stage 4 chronic kidney disease, or unspecified chronic kidney disease: Secondary | ICD-10-CM | POA: Diagnosis not present

## 2019-09-03 DIAGNOSIS — I130 Hypertensive heart and chronic kidney disease with heart failure and stage 1 through stage 4 chronic kidney disease, or unspecified chronic kidney disease: Secondary | ICD-10-CM | POA: Diagnosis not present

## 2019-09-03 DIAGNOSIS — E1122 Type 2 diabetes mellitus with diabetic chronic kidney disease: Secondary | ICD-10-CM | POA: Diagnosis not present

## 2019-09-03 DIAGNOSIS — J188 Other pneumonia, unspecified organism: Secondary | ICD-10-CM | POA: Diagnosis not present

## 2019-09-03 DIAGNOSIS — I482 Chronic atrial fibrillation, unspecified: Secondary | ICD-10-CM | POA: Diagnosis not present

## 2019-09-03 DIAGNOSIS — I5022 Chronic systolic (congestive) heart failure: Secondary | ICD-10-CM | POA: Diagnosis not present

## 2019-09-03 DIAGNOSIS — N183 Chronic kidney disease, stage 3 (moderate): Secondary | ICD-10-CM | POA: Diagnosis not present

## 2019-09-08 DIAGNOSIS — E1122 Type 2 diabetes mellitus with diabetic chronic kidney disease: Secondary | ICD-10-CM | POA: Diagnosis not present

## 2019-09-08 DIAGNOSIS — N183 Chronic kidney disease, stage 3 (moderate): Secondary | ICD-10-CM | POA: Diagnosis not present

## 2019-09-08 DIAGNOSIS — I5022 Chronic systolic (congestive) heart failure: Secondary | ICD-10-CM | POA: Diagnosis not present

## 2019-09-08 DIAGNOSIS — I482 Chronic atrial fibrillation, unspecified: Secondary | ICD-10-CM | POA: Diagnosis not present

## 2019-09-08 DIAGNOSIS — I130 Hypertensive heart and chronic kidney disease with heart failure and stage 1 through stage 4 chronic kidney disease, or unspecified chronic kidney disease: Secondary | ICD-10-CM | POA: Diagnosis not present

## 2019-09-08 DIAGNOSIS — J188 Other pneumonia, unspecified organism: Secondary | ICD-10-CM | POA: Diagnosis not present

## 2019-09-11 DIAGNOSIS — E1122 Type 2 diabetes mellitus with diabetic chronic kidney disease: Secondary | ICD-10-CM | POA: Diagnosis not present

## 2019-09-11 DIAGNOSIS — I130 Hypertensive heart and chronic kidney disease with heart failure and stage 1 through stage 4 chronic kidney disease, or unspecified chronic kidney disease: Secondary | ICD-10-CM | POA: Diagnosis not present

## 2019-09-11 DIAGNOSIS — I5022 Chronic systolic (congestive) heart failure: Secondary | ICD-10-CM | POA: Diagnosis not present

## 2019-09-11 DIAGNOSIS — N183 Chronic kidney disease, stage 3 (moderate): Secondary | ICD-10-CM | POA: Diagnosis not present

## 2019-09-11 DIAGNOSIS — J188 Other pneumonia, unspecified organism: Secondary | ICD-10-CM | POA: Diagnosis not present

## 2019-09-11 DIAGNOSIS — I482 Chronic atrial fibrillation, unspecified: Secondary | ICD-10-CM | POA: Diagnosis not present

## 2019-09-16 ENCOUNTER — Other Ambulatory Visit: Payer: Self-pay

## 2019-09-16 DIAGNOSIS — I482 Chronic atrial fibrillation, unspecified: Secondary | ICD-10-CM | POA: Diagnosis not present

## 2019-09-16 DIAGNOSIS — J188 Other pneumonia, unspecified organism: Secondary | ICD-10-CM | POA: Diagnosis not present

## 2019-09-16 DIAGNOSIS — N183 Chronic kidney disease, stage 3 (moderate): Secondary | ICD-10-CM | POA: Diagnosis not present

## 2019-09-16 DIAGNOSIS — I5022 Chronic systolic (congestive) heart failure: Secondary | ICD-10-CM | POA: Diagnosis not present

## 2019-09-16 DIAGNOSIS — I130 Hypertensive heart and chronic kidney disease with heart failure and stage 1 through stage 4 chronic kidney disease, or unspecified chronic kidney disease: Secondary | ICD-10-CM | POA: Diagnosis not present

## 2019-09-16 DIAGNOSIS — E1122 Type 2 diabetes mellitus with diabetic chronic kidney disease: Secondary | ICD-10-CM | POA: Diagnosis not present

## 2019-09-16 MED ORDER — ISOSORBIDE MONONITRATE ER 60 MG PO TB24: 60.0000 mg | ORAL_TABLET | Freq: Two times a day (BID) | ORAL | 2 refills | Status: AC

## 2019-09-20 DIAGNOSIS — M1991 Primary osteoarthritis, unspecified site: Secondary | ICD-10-CM | POA: Diagnosis not present

## 2019-09-20 DIAGNOSIS — I2510 Atherosclerotic heart disease of native coronary artery without angina pectoris: Secondary | ICD-10-CM | POA: Diagnosis not present

## 2019-09-20 DIAGNOSIS — M069 Rheumatoid arthritis, unspecified: Secondary | ICD-10-CM | POA: Diagnosis not present

## 2019-09-20 DIAGNOSIS — I482 Chronic atrial fibrillation, unspecified: Secondary | ICD-10-CM | POA: Diagnosis not present

## 2019-09-20 DIAGNOSIS — J188 Other pneumonia, unspecified organism: Secondary | ICD-10-CM | POA: Diagnosis not present

## 2019-09-20 DIAGNOSIS — Z7901 Long term (current) use of anticoagulants: Secondary | ICD-10-CM | POA: Diagnosis not present

## 2019-09-20 DIAGNOSIS — I130 Hypertensive heart and chronic kidney disease with heart failure and stage 1 through stage 4 chronic kidney disease, or unspecified chronic kidney disease: Secondary | ICD-10-CM | POA: Diagnosis not present

## 2019-09-20 DIAGNOSIS — E1122 Type 2 diabetes mellitus with diabetic chronic kidney disease: Secondary | ICD-10-CM | POA: Diagnosis not present

## 2019-09-20 DIAGNOSIS — Z794 Long term (current) use of insulin: Secondary | ICD-10-CM | POA: Diagnosis not present

## 2019-09-20 DIAGNOSIS — F0390 Unspecified dementia without behavioral disturbance: Secondary | ICD-10-CM | POA: Diagnosis not present

## 2019-09-20 DIAGNOSIS — I5022 Chronic systolic (congestive) heart failure: Secondary | ICD-10-CM | POA: Diagnosis not present

## 2019-09-20 DIAGNOSIS — Z9181 History of falling: Secondary | ICD-10-CM | POA: Diagnosis not present

## 2019-09-20 DIAGNOSIS — N183 Chronic kidney disease, stage 3 (moderate): Secondary | ICD-10-CM | POA: Diagnosis not present

## 2019-09-21 DIAGNOSIS — I482 Chronic atrial fibrillation, unspecified: Secondary | ICD-10-CM | POA: Diagnosis not present

## 2019-09-21 DIAGNOSIS — I5022 Chronic systolic (congestive) heart failure: Secondary | ICD-10-CM | POA: Diagnosis not present

## 2019-09-21 DIAGNOSIS — N183 Chronic kidney disease, stage 3 (moderate): Secondary | ICD-10-CM | POA: Diagnosis not present

## 2019-09-21 DIAGNOSIS — I130 Hypertensive heart and chronic kidney disease with heart failure and stage 1 through stage 4 chronic kidney disease, or unspecified chronic kidney disease: Secondary | ICD-10-CM | POA: Diagnosis not present

## 2019-09-21 DIAGNOSIS — E1122 Type 2 diabetes mellitus with diabetic chronic kidney disease: Secondary | ICD-10-CM | POA: Diagnosis not present

## 2019-09-21 DIAGNOSIS — J188 Other pneumonia, unspecified organism: Secondary | ICD-10-CM | POA: Diagnosis not present

## 2019-10-01 DIAGNOSIS — I130 Hypertensive heart and chronic kidney disease with heart failure and stage 1 through stage 4 chronic kidney disease, or unspecified chronic kidney disease: Secondary | ICD-10-CM | POA: Diagnosis not present

## 2019-10-01 DIAGNOSIS — I5022 Chronic systolic (congestive) heart failure: Secondary | ICD-10-CM | POA: Diagnosis not present

## 2019-10-01 DIAGNOSIS — I482 Chronic atrial fibrillation, unspecified: Secondary | ICD-10-CM | POA: Diagnosis not present

## 2019-10-01 DIAGNOSIS — J188 Other pneumonia, unspecified organism: Secondary | ICD-10-CM | POA: Diagnosis not present

## 2019-10-01 DIAGNOSIS — N183 Chronic kidney disease, stage 3 (moderate): Secondary | ICD-10-CM | POA: Diagnosis not present

## 2019-10-01 DIAGNOSIS — E1122 Type 2 diabetes mellitus with diabetic chronic kidney disease: Secondary | ICD-10-CM | POA: Diagnosis not present

## 2019-10-20 ENCOUNTER — Encounter: Payer: Self-pay | Admitting: Cardiology

## 2019-10-20 ENCOUNTER — Ambulatory Visit (INDEPENDENT_AMBULATORY_CARE_PROVIDER_SITE_OTHER): Payer: Medicare Other | Admitting: Cardiology

## 2019-10-20 ENCOUNTER — Other Ambulatory Visit: Payer: Self-pay

## 2019-10-20 VITALS — BP 130/78 | HR 92 | Ht 71.0 in | Wt 194.2 lb

## 2019-10-20 DIAGNOSIS — E785 Hyperlipidemia, unspecified: Secondary | ICD-10-CM

## 2019-10-20 DIAGNOSIS — E119 Type 2 diabetes mellitus without complications: Secondary | ICD-10-CM

## 2019-10-20 DIAGNOSIS — Z794 Long term (current) use of insulin: Secondary | ICD-10-CM | POA: Diagnosis not present

## 2019-10-20 DIAGNOSIS — Z7901 Long term (current) use of anticoagulants: Secondary | ICD-10-CM | POA: Diagnosis not present

## 2019-10-20 DIAGNOSIS — N1832 Chronic kidney disease, stage 3b: Secondary | ICD-10-CM

## 2019-10-20 DIAGNOSIS — Z9861 Coronary angioplasty status: Secondary | ICD-10-CM

## 2019-10-20 DIAGNOSIS — R5381 Other malaise: Secondary | ICD-10-CM | POA: Diagnosis not present

## 2019-10-20 DIAGNOSIS — I2510 Atherosclerotic heart disease of native coronary artery without angina pectoris: Secondary | ICD-10-CM

## 2019-10-20 DIAGNOSIS — I5042 Chronic combined systolic (congestive) and diastolic (congestive) heart failure: Secondary | ICD-10-CM | POA: Diagnosis not present

## 2019-10-20 DIAGNOSIS — I4821 Permanent atrial fibrillation: Secondary | ICD-10-CM

## 2019-10-20 NOTE — Assessment & Plan Note (Signed)
GFR 32 in July 2020

## 2019-10-20 NOTE — Progress Notes (Addendum)
Cardiology Office Note:    Date:  10/20/2019   ID:  Julian Ross, DOB 01-22-1946, MRN [id number]  PCP:  Reynold Bowen, MD  Cardiologist:  Dr [name] Electrophysiologist:  None   Referring MD: Reynold Bowen, MD   No chief complaint on file.   History of Present Illness:    Julian Ross is a pleasant 73 y.o. male, a retired Pharmacist, community,  with a history of multiple prior PCIs at [facility],. He was ultimately cathed here in Nov 2018.  Medical Rx was recomended after consult with Dr Prescott Gum.  He also has CAFon Eliquis, IDDM, HLD on Repatha,  CRI-3, and chronic combined CHF.  His last EF was 40-45% May 2019.    In May 2019 he was admitted with MS changes and falls.  He was admitted again in July 2019 with hypoglycemia in the setting of MRSA infection.  He last saw Dr [name] in Jan 2020 and was stable.  [facility] was [facility] in July 2020 and then sent to short term rehab at [facility].  His medications had to be adjusted, they needed to be crushed for the patient to be able to take and not all of them could be.  He was apparently quite ill and they were not sure he was going to make it. He did gradually improve and was discharged 08/10/2019 back to his home with an aid. He presents to the office today for follow up.  He was accompanied by his aid who stays with him.  He is now back on all his medications.  He has had some LE edema and Lasix 40 mg was recently added.  He denies orthopnea or new DOE.  His edema is essentially unchanged despite the addition of Lasix 40 mg.    Past Medical History:  Diagnosis Date  . Anemia   . Anxiety state   . CAD (coronary artery disease)    16 prior stents (at [facility])  . Cardiomyopathy, ischemic   . Chronic lower back pain   . Chronic systolic CHF (congestive heart failure) ([place])   . CKD (chronic kidney disease), stage III ([place])   . Depression   . Diabetes mellitus type 2 in obese ([place])   . Fibromyalgia    . Hepatitis A    at age 21  . High cholesterol   . History of blood transfusion    "related to OR"  . Hypertension   . Hypokalemia   . MI (myocardial infarction) ([place]) 1990; 1995; 1997  . OSA on CPAP    setting = 13, full face mask  . Osteoarthritis   . Persistent atrial fibrillation 02/2016   on Eliquis  . Rheumatoid arthritis of multiple sites with negative rheumatoid factor ([place])   . Septic shock ([place]) 02/2016   in setting of severe pneumonia    Past Surgical History:  Procedure Laterality Date  . BACK SURGERY    . CARDIAC CATHETERIZATION N/A 10/03/2016   Procedure: Left Heart Cath and Coronary Angiography;  Surgeon: Belva Crome, MD;  Location: [facility];  Service: Cardiovascular;  Laterality: N/A;  . CARDIOVERSION N/A 06/04/2016   Procedure: CARDIOVERSION;  Surgeon: Skeet Latch, MD;  Location: [facility];  Service: Cardiovascular;  Laterality: N/A;  . CATARACT EXTRACTION W/ INTRAOCULAR LENS  IMPLANT, BILATERAL Bilateral 2000s  . CORONARY ANGIOPLASTY  early 80s  . CORONARY ANGIOPLASTY WITH STENT PLACEMENT     "I've got 15 stents" (07/19/2016)  .  KNEE ARTHROSCOPY Left 1990s  . LEFT HEART CATH AND CORONARY ANGIOGRAPHY N/A 11/15/2017   Procedure: LEFT HEART CATH AND CORONARY ANGIOGRAPHY;  Surgeon: Jettie Booze, MD;  Location: [facility];  Service: Cardiovascular;  Laterality: N/A;  . NASAL SINUS SURGERY    . POSTERIOR LUMBAR FUSION  2015   L4-5-S1  . right knee surgery     age of 16  . TONSILLECTOMY AND ADENOIDECTOMY  1949    Current Medications: Current Meds  Medication Sig  . ALPRAZolam (XANAX) 0.5 MG tablet Take 1 tablet (0.5 mg total) by mouth at bedtime as needed for anxiety or sleep.  Marland Kitchen apixaban (ELIQUIS) 5 MG TABS tablet Take 1 tablet (5 mg total) by mouth 2 (two) times daily.  Marland Kitchen aspirin EC 81 MG EC tablet Take 1 tablet (81 mg total) daily by mouth.  . carvedilol (COREG) 12.5 MG tablet Take 1 tablet (12.5 mg total) by mouth 2  (two) times daily with a meal.  . clopidogrel (PLAVIX) 75 MG tablet TAKE 1 TABLET(75 MG) BY MOUTH DAILY  . fenofibrate (TRICOR) 145 MG tablet Take 145 mg by mouth daily.  . furosemide (LASIX) 40 MG tablet Take 1 tablet (40 mg total) by mouth daily as needed.  Marland Kitchen HUMALOG MIX 50/50 KWIKPEN (50-50) 100 UNIT/ML Kwikpen ADM 10 UNI [place] WITH EACH MEAL. INCREASE UTD  . isosorbide mononitrate (IMDUR) 60 MG 24 hr tablet Take 1 tablet (60 mg total) by mouth 2 (two) times daily.  Marland Kitchen LUMIGAN 0.01 % SOLN Place 1 drop into both eyes at bedtime.   . nitroGLYCERIN (NITROSTAT) 0.4 MG SL tablet PLACE 1 TABLET UNDER THE TONGUE IF NEEDED  . omega-3 acid ethyl esters (LOVAZA) 1 g capsule Take 1 capsule (1 g total) by mouth 2 (two) times daily.  . pantoprazole (PROTONIX) 40 MG tablet TAKE 1 TABLET(40 MG) BY MOUTH DAILY  . PARoxetine (PAXIL) 40 MG tablet Take 40 mg by mouth daily.   . potassium chloride SA (K-DUR) 20 MEQ tablet TAKE 2 TABLETS BY MOUTH TWICE DAILY  . predniSONE (DELTASONE) 5 MG tablet Take 5 mg by mouth daily with breakfast.  . ranolazine (RANEXA) 1000 MG SR tablet Take 1 tablet (1,000 mg total) by mouth 2 (two) times daily.  . tamsulosin (FLOMAX) 0.4 MG CAPS capsule Take 0.4 mg by mouth daily.   Marland Kitchen tiZANidine (ZANAFLEX) 4 MG tablet Take 1 tablet by mouth every 8 (eight) hours as needed for muscle spasms.   Marland Kitchen topiramate (TOPAMAX) 25 MG tablet Take 25 mg by mouth 2 (two) times daily.  . traMADol (ULTRAM) 50 MG tablet Take 1 tablet (50 mg total) by mouth 3 (three) times daily as needed (pain). Take as directed     Allergies:   Statins, Ace inhibitors, Contrast media [iodinated diagnostic agents], and Penicillins   Social History   Socioeconomic History  . Marital status: Married    Spouse name: Not on file  . Number of children: Not on file  . Years of education: Not on file  . Highest education level: Not on file  Occupational History  . Occupation: retired Pharmacist, community  Social Needs  . Financial  resource strain: Not on file  . Food insecurity    Worry: Not on file    Inability: Not on file  . Transportation needs    Medical: Not on file    Non-medical: Not on file  Tobacco Use  . Smoking status: Never Smoker  . Smokeless tobacco: Never Used  Substance and Sexual Activity  . Alcohol use: No  . Drug use: No  . Sexual activity: Not Currently  Lifestyle  . Physical activity    Days per week: Not on file    Minutes per session: Not on file  . Stress: Not on file  Relationships  . Social Herbalist on phone: Not on file    Gets together: Not on file    Attends religious service: Not on file    Active member of club or organization: Not on file    Attends meetings of clubs or organizations: Not on file    Relationship status: Not on file  Other Topics Concern  . Not on file  Social History Narrative   [employer], Dentist retired on disability.  Lived in [place] but recently moved to [place].  6 sons, 21 grandchildren.     Family History: The patient's family history includes Heart attack in his brother and father; Heart failure in his mother; Hyperlipidemia in his mother; Hypertension in his mother.  ROS:   Please see the history of present illness.     All other systems reviewed and are negative.  EKGs/Labs/Other Studies Reviewed:    The following studies were reviewed today: Echo may 2019  EKG:  EKG is ordered today.  The ekg ordered today demonstrates AF with VR 92  Recent Labs: No results found for requested labs within last 8760 hours.  Recent Lipid Panel    Component Value Date/Time   CHOL 98 05/29/2018 1004   CHOL 167 05/05/2018 0939   TRIG 87 05/29/2018 1004   HDL 17 (L) 05/29/2018 1004   HDL 46 05/05/2018 0939   CHOLHDL 5.8 05/29/2018 1004   VLDL 17 05/29/2018 1004   LDLCALC 64 05/29/2018 1004   LDLCALC 93 05/05/2018 0939    Physical Exam:    VS:  There were no vitals taken for this visit.    Wt Readings from Last 3 Encounters:   05/27/19 195 lb (88.5 kg)  01/09/19 194 lb 9.6 oz (88.3 kg)  07/02/18 200 lb (90.7 kg)     GEN:  Chronically ill appearing, pleasant, well developed male in no acute distress HEENT: Normal NECK: No JVD; No carotid bruits LYMPHATICS: No lymphadenopathy CARDIAC: irregularly irregular no murmurs, rubs, gallops RESPIRATORY:  Bi basilar crackles MUSCULOSKELETAL:  1+ bilateral LE pitting edema No deformity  SKIN: Warm and dry NEUROLOGIC:  Alert and oriented x 3 PSYCHIATRIC:  Normal affect (joking)   ASSESSMENT:    Chronic combined systolic and diastolic CHF (congestive heart failure) (HCC) EF 40-45% May 2019 echo  CAD S/P percutaneous coronary angioplasty Severe multivessel CAD and multiple prior PCIs [facility]).  Cath 10/03/16-referred to Dr Milagros Loll for consideration of CABG. Pt currently stable on medical Rx. Cath Nov 2018 for [place]- continue medical Rx  CKD (chronic kidney disease) stage 3, GFR 30-59 ml/min (HCC) GFR 32 in July 2020  Chronic anticoagulation CHADs VASc=5. On Eliquis 5 mg BID  Dyslipidemia, goal LDL below 70 On Repatha- Statin failure secondary to myalgia  Permanent atrial fibrillation (HCC) CAF- on Eliquis  Debilitated patient Overall debilitated secondary to multiple medical problems- He is DNR  PLAN:    Check BMP and BNP- then consider increasing his Lasix if his BNP is elevated and his renal function stable.  I am concerned about the basilar crackles on exam, that is not previously noted in office notes. F/U Dr [name] in 3 months.   Note: [name] also mentioned  that he will need some dental work done.  He needs multiple teeth pulled.  In the past he has had problems with bleeding.  It may be best to plan to hold his Eliquis for this, I have asked them to send [place] an official request from his dentist and will review it with the pharmacist and Dr. [name] when he gets scheduled.   Medication Adjustments/Labs and Tests Ordered: Current medicines are  reviewed at length with the patient today.  Concerns regarding medicines are outlined above.  No orders of the defined types were placed in this encounter.  No orders of the defined types were placed in this encounter.   There are no Patient Instructions on file for this visit.   Signed, Kerin Ransom, PA-C  10/20/2019 1:38 PM    [facility]

## 2019-10-20 NOTE — Assessment & Plan Note (Signed)
On Repatha- Statin failure secondary to myalgia

## 2019-10-20 NOTE — Assessment & Plan Note (Signed)
EF 40-45% May 2019 echo

## 2019-10-20 NOTE — Assessment & Plan Note (Signed)
Severe multivessel CAD and multiple prior PCIs [facility]).  Cath 10/03/16-referred to Dr Milagros Loll for consideration of CABG. Pt currently stable on medical Rx. Cath Nov 2018 for [place]- continue medical Rx

## 2019-10-20 NOTE — Assessment & Plan Note (Signed)
CHADs VASc=5. On Eliquis 5 mg BID

## 2019-10-20 NOTE — Assessment & Plan Note (Signed)
CAF- on Eliquis

## 2019-10-20 NOTE — Assessment & Plan Note (Signed)
Overall debilitated secondary to multiple medical problems- He is DNR

## 2019-10-20 NOTE — Patient Instructions (Signed)
Medication Instructions:  Your physician recommends that you continue on your current medications as directed. Please refer to the Current Medication list given to you today. *If you need a refill on your cardiac medications before your next appointment, please call your pharmacy*  Lab Work: Your physician recommends that you return for lab work in: TODAY-BMET, BNP If you have labs (blood work) drawn today and your tests are completely normal, you will receive your results only by: Marland Kitchen MyChart Message (if you have MyChart) OR . A paper copy in the mail If you have any lab test that is abnormal or we need to change your treatment, we will call you to review the results.  Testing/Procedures: NONE   Follow-Up: At [facility], you and your health needs are our priority.  As part of our continuing mission to provide you with exceptional heart care, we have created designated Provider Care Teams.  These Care Teams include your primary Cardiologist (physician) and Advanced Practice Providers (APPs -  Physician Assistants and Nurse Practitioners) who all work together to provide you with the care you need, when you need it.  Your next appointment:   3 months  The format for your next appointment:   In Person  Provider:   You may see Dr Skeet Latch or one of the following Advanced Practice Providers on your designated Care Team:    Kerin Ransom, PA-C  [place], [place]  Coletta Memos, [place]   Other Instructions

## 2019-10-21 LAB — PRO B NATRIURETIC PEPTIDE: NT-Pro BNP: 1508 pg/mL — ABNORMAL HIGH (ref 0–376)

## 2019-10-21 LAB — BASIC METABOLIC PANEL
BUN/Creatinine Ratio: 18 (ref 10–24)
BUN: 26 mg/dL (ref 8–27)
CO2: 22 mmol/L (ref 20–29)
Calcium: 8.9 mg/dL (ref 8.6–10.2)
Chloride: 107 mmol/L — ABNORMAL HIGH (ref 96–106)
Creatinine, Ser: 1.45 mg/dL — ABNORMAL HIGH (ref 0.76–1.27)
GFR calc Af Amer: 55 mL/min/{1.73_m2} — ABNORMAL LOW (ref >59)
GFR calc non Af Amer: 47 mL/min/{1.73_m2} — ABNORMAL LOW (ref >59)
Glucose: 159 mg/dL — ABNORMAL HIGH (ref 65–99)
Potassium: 4.8 mmol/L (ref 3.5–5.2)
Sodium: 140 mmol/L (ref 134–144)

## 2019-10-23 ENCOUNTER — Telehealth: Payer: Self-pay | Admitting: Cardiovascular Disease

## 2019-10-23 NOTE — Telephone Encounter (Signed)
Follow up:     Patient daughter calling to results she is returning the call. Please call back.

## 2019-10-23 NOTE — Telephone Encounter (Addendum)
Pts daughter not on the [place].. unable to LM on pt number since no VM has been set up.. LM for pts son on DPR... [name] for permission to call his sister back.Julian Ross.

## 2019-10-23 NOTE — Telephone Encounter (Signed)
Pts daughter called and verbalized understanding of his BNP results and to increase his lasix to 80 mg a day.

## 2019-11-11 DIAGNOSIS — H52203 Unspecified astigmatism, bilateral: Secondary | ICD-10-CM | POA: Diagnosis not present

## 2019-11-11 DIAGNOSIS — H401134 Primary open-angle glaucoma, bilateral, indeterminate stage: Secondary | ICD-10-CM | POA: Diagnosis not present

## 2019-11-11 DIAGNOSIS — H26491 Other secondary cataract, right eye: Secondary | ICD-10-CM | POA: Diagnosis not present

## 2019-11-11 DIAGNOSIS — E113292 Type 2 diabetes mellitus with mild nonproliferative diabetic retinopathy without macular edema, left eye: Secondary | ICD-10-CM | POA: Diagnosis not present

## 2019-11-16 DIAGNOSIS — E7849 Other hyperlipidemia: Secondary | ICD-10-CM | POA: Diagnosis not present

## 2019-11-16 DIAGNOSIS — Z7901 Long term (current) use of anticoagulants: Secondary | ICD-10-CM | POA: Diagnosis not present

## 2019-11-16 DIAGNOSIS — N183 Chronic kidney disease, stage 3 unspecified: Secondary | ICD-10-CM | POA: Diagnosis not present

## 2019-11-16 DIAGNOSIS — R269 Unspecified abnormalities of gait and mobility: Secondary | ICD-10-CM | POA: Diagnosis not present

## 2019-11-16 DIAGNOSIS — J849 Interstitial pulmonary disease, unspecified: Secondary | ICD-10-CM | POA: Diagnosis not present

## 2019-11-16 DIAGNOSIS — D631 Anemia in chronic kidney disease: Secondary | ICD-10-CM | POA: Diagnosis not present

## 2019-11-16 DIAGNOSIS — E1140 Type 2 diabetes mellitus with diabetic neuropathy, unspecified: Secondary | ICD-10-CM | POA: Diagnosis not present

## 2019-11-16 DIAGNOSIS — E559 Vitamin D deficiency, unspecified: Secondary | ICD-10-CM | POA: Diagnosis not present

## 2019-11-16 DIAGNOSIS — E1142 Type 2 diabetes mellitus with diabetic polyneuropathy: Secondary | ICD-10-CM | POA: Diagnosis not present

## 2019-11-16 DIAGNOSIS — G3184 Mild cognitive impairment, so stated: Secondary | ICD-10-CM | POA: Diagnosis not present

## 2019-11-16 DIAGNOSIS — Z23 Encounter for immunization: Secondary | ICD-10-CM | POA: Diagnosis not present

## 2019-12-01 DEATH — deceased

## 2020-01-21 ENCOUNTER — Ambulatory Visit: Payer: Medicare Other | Admitting: Cardiovascular Disease
